# Patient Record
Sex: Male | Born: 1950
Health system: Southern US, Community
[De-identification: ages and names within clinical notes are randomized; demographics above are authoritative.]

## PROBLEM LIST (undated history)

## (undated) DIAGNOSIS — J439 Emphysema, unspecified: Secondary | ICD-10-CM

## (undated) DIAGNOSIS — I10 Essential (primary) hypertension: Secondary | ICD-10-CM

## (undated) DIAGNOSIS — H409 Unspecified glaucoma: Secondary | ICD-10-CM

## (undated) DIAGNOSIS — J449 Chronic obstructive pulmonary disease, unspecified: Secondary | ICD-10-CM

## (undated) DIAGNOSIS — C3490 Malignant neoplasm of unspecified part of unspecified bronchus or lung: Secondary | ICD-10-CM

## (undated) HISTORY — PX: OTHER SURGICAL HISTORY: SHX169

## (undated) HISTORY — PX: LUNG BIOPSY: SHX232

---

## 2001-04-27 ENCOUNTER — Emergency Department (HOSPITAL_COMMUNITY): Admission: EM | Admit: 2001-04-27 | Discharge: 2001-04-27 | Payer: Self-pay | Admitting: Emergency Medicine

## 2004-08-16 ENCOUNTER — Emergency Department: Payer: Self-pay | Admitting: Unknown Physician Specialty

## 2010-06-25 ENCOUNTER — Emergency Department: Payer: Self-pay | Admitting: Emergency Medicine

## 2013-10-11 ENCOUNTER — Emergency Department: Payer: Self-pay | Admitting: Internal Medicine

## 2013-10-11 LAB — COMPREHENSIVE METABOLIC PANEL
ALK PHOS: 85 U/L
Albumin: 3.1 g/dL — ABNORMAL LOW (ref 3.4–5.0)
Anion Gap: 6 — ABNORMAL LOW (ref 7–16)
BUN: 11 mg/dL (ref 7–18)
Bilirubin,Total: 0.9 mg/dL (ref 0.2–1.0)
Calcium, Total: 8.8 mg/dL (ref 8.5–10.1)
Chloride: 107 mmol/L (ref 98–107)
Co2: 26 mmol/L (ref 21–32)
Creatinine: 0.76 mg/dL (ref 0.60–1.30)
EGFR (African American): >60
EGFR (Non-African Amer.): >60
Glucose: 66 mg/dL (ref 65–99)
Osmolality: 275 (ref 275–301)
Potassium: 4.3 mmol/L (ref 3.5–5.1)
SGOT(AST): 17 U/L (ref 15–37)
SGPT (ALT): 12 U/L (ref 12–78)
Sodium: 139 mmol/L (ref 136–145)
TOTAL PROTEIN: 7.2 g/dL (ref 6.4–8.2)

## 2013-10-11 LAB — CK TOTAL AND CKMB (NOT AT ARMC)
CK, Total: 64 U/L
CK-MB: 0.7 ng/mL (ref 0.5–3.6)

## 2013-10-11 LAB — PROTIME-INR
INR: 1
Prothrombin Time: 13.2 secs (ref 11.5–14.7)

## 2013-10-11 LAB — CBC
HCT: 40.5 % (ref 40.0–52.0)
HGB: 12.9 g/dL — AB (ref 13.0–18.0)
MCH: 27.2 pg (ref 26.0–34.0)
MCHC: 31.9 g/dL — ABNORMAL LOW (ref 32.0–36.0)
MCV: 85 fL (ref 80–100)
Platelet: 185 10*3/uL (ref 150–440)
RBC: 4.75 10*6/uL (ref 4.40–5.90)
RDW: 14.7 % — ABNORMAL HIGH (ref 11.5–14.5)
WBC: 7.1 10*3/uL (ref 3.8–10.6)

## 2013-10-11 LAB — TROPONIN I
Troponin-I: <0.02 ng/mL
Troponin-I: <0.02 ng/mL

## 2013-10-11 LAB — APTT: Activated PTT: 29.1 secs (ref 23.6–35.9)

## 2014-01-11 ENCOUNTER — Emergency Department: Payer: Self-pay | Admitting: Emergency Medicine

## 2014-06-22 ENCOUNTER — Emergency Department: Payer: Self-pay | Admitting: Emergency Medicine

## 2014-06-22 LAB — CBC
HCT: 39.1 % — AB (ref 40.0–52.0)
HGB: 12.5 g/dL — AB (ref 13.0–18.0)
MCH: 28 pg (ref 26.0–34.0)
MCHC: 32 g/dL (ref 32.0–36.0)
MCV: 88 fL (ref 80–100)
Platelet: 144 10*3/uL — ABNORMAL LOW (ref 150–440)
RBC: 4.47 10*6/uL (ref 4.40–5.90)
RDW: 14.4 % (ref 11.5–14.5)
WBC: 3.6 10*3/uL — ABNORMAL LOW (ref 3.8–10.6)

## 2014-06-22 LAB — TROPONIN I: Troponin-I: <0.02 ng/mL

## 2014-06-22 LAB — COMPREHENSIVE METABOLIC PANEL
ANION GAP: 4 — AB (ref 7–16)
Albumin: 3.2 g/dL — ABNORMAL LOW (ref 3.4–5.0)
Alkaline Phosphatase: 86 U/L
BILIRUBIN TOTAL: 1 mg/dL (ref 0.2–1.0)
BUN: 12 mg/dL (ref 7–18)
Calcium, Total: 8.4 mg/dL — ABNORMAL LOW (ref 8.5–10.1)
Chloride: 108 mmol/L — ABNORMAL HIGH (ref 98–107)
Co2: 27 mmol/L (ref 21–32)
Creatinine: 1.06 mg/dL (ref 0.60–1.30)
EGFR (Non-African Amer.): >60
Glucose: 126 mg/dL — ABNORMAL HIGH (ref 65–99)
OSMOLALITY: 279 (ref 275–301)
POTASSIUM: 3.7 mmol/L (ref 3.5–5.1)
SGOT(AST): 28 U/L (ref 15–37)
SGPT (ALT): 19 U/L
Sodium: 139 mmol/L (ref 136–145)
Total Protein: 6.5 g/dL (ref 6.4–8.2)

## 2014-06-22 LAB — PRO B NATRIURETIC PEPTIDE: B-TYPE NATIURETIC PEPTID: 69 pg/mL (ref 0–125)

## 2014-06-22 LAB — CK TOTAL AND CKMB (NOT AT ARMC)
CK, Total: 71 U/L (ref 39–308)
CK-MB: 0.7 ng/mL (ref 0.5–3.6)

## 2014-09-15 DIAGNOSIS — R609 Edema, unspecified: Secondary | ICD-10-CM | POA: Insufficient documentation

## 2015-04-28 ENCOUNTER — Emergency Department: Payer: Medicare Other

## 2015-04-28 ENCOUNTER — Emergency Department
Admission: EM | Admit: 2015-04-28 | Discharge: 2015-04-29 | Disposition: A | Discharge status: Home / Self Care | Payer: Medicare Other | Attending: Emergency Medicine | Admitting: Emergency Medicine

## 2015-04-28 ENCOUNTER — Other Ambulatory Visit: Payer: Self-pay

## 2015-04-28 ENCOUNTER — Encounter: Payer: Self-pay | Admitting: Emergency Medicine

## 2015-04-28 DIAGNOSIS — R059 Cough, unspecified: Secondary | ICD-10-CM

## 2015-04-28 DIAGNOSIS — J441 Chronic obstructive pulmonary disease with (acute) exacerbation: Secondary | ICD-10-CM | POA: Diagnosis not present

## 2015-04-28 DIAGNOSIS — Z720 Tobacco use: Secondary | ICD-10-CM | POA: Diagnosis not present

## 2015-04-28 DIAGNOSIS — J40 Bronchitis, not specified as acute or chronic: Secondary | ICD-10-CM

## 2015-04-28 DIAGNOSIS — R0602 Shortness of breath: Secondary | ICD-10-CM | POA: Diagnosis present

## 2015-04-28 DIAGNOSIS — R05 Cough: Secondary | ICD-10-CM

## 2015-04-28 HISTORY — DX: Chronic obstructive pulmonary disease, unspecified: J44.9

## 2015-04-28 HISTORY — DX: Unspecified glaucoma: H40.9

## 2015-04-28 LAB — CBC
HCT: 37.2 % — ABNORMAL LOW (ref 40.0–52.0)
Hemoglobin: 12.1 g/dL — ABNORMAL LOW (ref 13.0–18.0)
MCH: 28.1 pg (ref 26.0–34.0)
MCHC: 32.6 g/dL (ref 32.0–36.0)
MCV: 86.4 fL (ref 80.0–100.0)
PLATELETS: 141 10*3/uL — AB (ref 150–440)
RBC: 4.3 MIL/uL — ABNORMAL LOW (ref 4.40–5.90)
RDW: 16.7 % — AB (ref 11.5–14.5)
WBC: 6.4 10*3/uL (ref 3.8–10.6)

## 2015-04-28 LAB — BASIC METABOLIC PANEL
Anion gap: 10 (ref 5–15)
BUN: 10 mg/dL (ref 6–20)
CHLORIDE: 105 mmol/L (ref 101–111)
CO2: 24 mmol/L (ref 22–32)
CREATININE: 0.71 mg/dL (ref 0.61–1.24)
Calcium: 8.6 mg/dL — ABNORMAL LOW (ref 8.9–10.3)
GFR calc Af Amer: >60 mL/min (ref >60)
GFR calc non Af Amer: >60 mL/min (ref >60)
Glucose, Bld: 96 mg/dL (ref 65–99)
Potassium: 3.5 mmol/L (ref 3.5–5.1)
Sodium: 139 mmol/L (ref 135–145)

## 2015-04-28 LAB — TROPONIN I: Troponin I: <0.03 ng/mL (ref <0.031)

## 2015-04-28 MED ORDER — ONDANSETRON HCL 4 MG/2ML IJ SOLN
4.0000 mg | Freq: Once | INTRAMUSCULAR | Status: AC
  Administered 2015-04-28: 4 mg via INTRAVENOUS
  Filled 2015-04-28: qty 2

## 2015-04-28 MED ORDER — IPRATROPIUM-ALBUTEROL 0.5-2.5 (3) MG/3ML IN SOLN
3.0000 mL | Freq: Once | RESPIRATORY_TRACT | Status: AC
  Administered 2015-04-28: 3 mL via RESPIRATORY_TRACT
  Filled 2015-04-28: qty 3

## 2015-04-28 MED ORDER — SODIUM CHLORIDE 0.9 % IV BOLUS (SEPSIS)
1000.0000 mL | Freq: Once | INTRAVENOUS | Status: AC
  Administered 2015-04-29: 1000 mL via INTRAVENOUS

## 2015-04-28 NOTE — ED Notes (Signed)
Pt states feeling nauseous, verbal order given for 4 mg Zofran and DUO NEB, see MAR

## 2015-04-28 NOTE — ED Notes (Signed)
Patient presents to Emergency Department via [facility] with complaints of grey productive cough and SOB, hx of COPD.  Pt cough present at triage with diminished sounds bilaterally in lungs.  EMS gave '5mg'$  albuterol SVN sats now 100% on mask.

## 2015-04-28 NOTE — ED Notes (Addendum)
Pt reports used inhaler without relief, reports coughing began 2 pm and became worse in the last 2 hours. Pt denies chest pain or abdominal pain. (+) nausea, reports coughs up "milky gray phlegm." Pt is an every day smoker, 1 pack/day. Pt alert and oriented x 4, skin warm and dry. Hx of COPD.

## 2015-04-29 MED ORDER — ALBUTEROL SULFATE HFA 108 (90 BASE) MCG/ACT IN AERS: 2.0000 | INHALATION_SPRAY | Freq: Four times a day (QID) | RESPIRATORY_TRACT | Status: DC | PRN

## 2015-04-29 MED ORDER — BENZONATATE 100 MG PO CAPS
100.0000 mg | ORAL_CAPSULE | Freq: Once | ORAL | Status: AC
  Administered 2015-04-29: 100 mg via ORAL
  Filled 2015-04-29: qty 1

## 2015-04-29 MED ORDER — AZITHROMYCIN 250 MG PO TABS
500.0000 mg | ORAL_TABLET | Freq: Once | ORAL | Status: AC
  Administered 2015-04-29: 500 mg via ORAL
  Filled 2015-04-29: qty 2

## 2015-04-29 MED ORDER — PREDNISONE 20 MG PO TABS: 60.0000 mg | ORAL_TABLET | Freq: Every day | ORAL | Status: DC

## 2015-04-29 MED ORDER — AZITHROMYCIN 250 MG PO TABS
ORAL_TABLET | ORAL | Status: AC
Start: 2015-04-29 — End: 2015-05-04

## 2015-04-29 MED ORDER — BENZONATATE 100 MG PO CAPS: 100.0000 mg | ORAL_CAPSULE | Freq: Three times a day (TID) | ORAL | Status: DC | PRN

## 2015-04-29 MED ORDER — PREDNISONE 20 MG PO TABS
60.0000 mg | ORAL_TABLET | Freq: Once | ORAL | Status: AC
  Administered 2015-04-29: 60 mg via ORAL
  Filled 2015-04-29: qty 3

## 2015-04-29 NOTE — ED Notes (Signed)

## 2015-04-29 NOTE — ED Notes (Signed)
Assisted pt up in bed for comfort. Pt reports breathing is better but is c/o sore throat due to coughing. No increased work in breathing noted, skin warm and dry, family at bedside.

## 2015-04-29 NOTE — ED Provider Notes (Signed)
[facility] Emergency Department Provider Note  ____________________________________________  Time seen: Approximately 2344 PM  I have reviewed the triage vital signs and the nursing notes.   HISTORY  Chief Complaint Shortness of Breath and Cough    HPI Carl Bell is a 64 y.o. male who comes in to the hospital tonight with cough and difficulty breathing. The patient reports that he couldn't stop coughing. He reports that this started today when he smoked a cigarette and every time he tries to Smokies coughing constantly. The patient does have a history of COPD and reports that he was feeling okay until today. The patient has inhalers at home and rarely uses them but reports that he did try using his inhaler today and it did not work. The patient denies any fever or sick contacts. He denies any chest pain or headache no dizziness or lightheadedness no abdominal pain nausea or vomiting. The patient simply says he is coughing. The patient has a cough productive of milky grey sputum. The patient came in by EMS for evaluation.   Past Medical History  Diagnosis Date  . COPD (chronic obstructive pulmonary disease) ([place])   . Glaucoma     There are no active problems to display for this patient.   History reviewed. No pertinent past surgical history.  Current Outpatient Rx  Name  Route  Sig  Dispense  Refill  . albuterol (PROVENTIL HFA;VENTOLIN HFA) 108 (90 BASE) MCG/ACT inhaler   Inhalation   Inhale 2 puffs into the lungs every 6 (six) hours as needed for wheezing or shortness of breath.   1 Inhaler   0   . azithromycin (ZITHROMAX Z-PAK) 250 MG tablet      Take 2 tablets (500 mg) on  Day 1,  followed by 1 tablet (250 mg) once daily on Days 2 through 5.   6 each   0   . benzonatate (TESSALON PERLES) 100 MG capsule   Oral   Take 1 capsule (100 mg total) by mouth 3 (three) times daily as needed for cough.   15 capsule   0   . predniSONE (DELTASONE)  20 MG tablet   Oral   Take 3 tablets (60 mg total) by mouth daily.   12 tablet   0     Allergies Review of patient's allergies indicates no known allergies.  History reviewed. No pertinent family history.  Social History Social History  Substance Use Topics  . Smoking status: Current Every Day Smoker -- 1.00 packs/day    Types: Cigarettes  . Smokeless tobacco: None  . Alcohol Use: Yes    Review of Systems Constitutional: No fever/chills Eyes: No visual changes. ENT: No sore throat. Cardiovascular: Denies chest pain. Respiratory: shortness of breath and cough Gastrointestinal: No abdominal pain.  No nausea, no vomiting.  No diarrhea.  No constipation. Genitourinary: Negative for dysuria. Musculoskeletal: Negative for back pain. Skin: Negative for rash. Neurological: Negative for headaches, focal weakness or numbness.  10-point ROS otherwise negative.  ____________________________________________   PHYSICAL EXAM:  VITAL SIGNS: ED Triage Vitals  Enc Vitals Group     BP 04/28/15 2244 159/97 mmHg     Pulse Rate 04/28/15 2244 103     Resp 04/28/15 2244 23     Temp 04/28/15 2244 98.2 F (36.8 C)     Temp Source 04/28/15 2244 Axillary     SpO2 04/28/15 2244 100 %     Weight 04/28/15 2244 146 lb 9.7 oz (66.5 kg)  Height 04/28/15 2244 '6\' 1"'$  (1.854 m)     Head Cir --      Peak Flow --      Pain Score --      Pain Loc --      Pain Edu? --      Excl. in [place]? --     Constitutional: Alert and oriented. Well appearing and in moderate distress. Eyes: Conjunctivae are normal. PERRL. EOMI. Head: Atraumatic. Nose: No congestion/rhinnorhea. Mouth/Throat: Mucous membranes are moist.  Oropharynx non-erythematous. Cardiovascular: Normal rate, regular rhythm. Grossly normal heart sounds.  Good peripheral circulation. Respiratory: Mildly increased respiratory effort with significant coughing.  No retractions. Lungs diminished throughout. Gastrointestinal: Soft and  nontender. No distention. Positive bowel sounds Musculoskeletal: No lower extremity tenderness nor edema.   Neurologic:  Normal speech and language.  Skin:  Skin is warm, dry and intact.  Psychiatric: Mood and affect are normal.   ____________________________________________   LABS (all labs ordered are listed, but only abnormal results are displayed)  Labs Reviewed  BASIC METABOLIC PANEL - Abnormal; Notable for the following:    Calcium 8.6 (*)    All other components within normal limits  CBC - Abnormal; Notable for the following:    RBC 4.30 (*)    Hemoglobin 12.1 (*)    HCT 37.2 (*)    RDW 16.7 (*)    Platelets 141 (*)    All other components within normal limits  TROPONIN I   ____________________________________________  EKG  ED ECG REPORT I, Loney Hering, the attending physician, personally viewed and interpreted this ECG.   Date: 04/28/2015  EKG Time: 2244  Rate: 103  Rhythm: sinus tachycardia  Axis: normal  Intervals:none  ST&T Change: none  ____________________________________________  RADIOLOGY  Chest x-ray: No active cardiopulmonary disease ____________________________________________   PROCEDURES  Procedure(s) performed: None  Critical Care performed: No  ____________________________________________   INITIAL IMPRESSION / ASSESSMENT AND PLAN / ED COURSE  Pertinent labs & imaging results that were available during my care of the patient were reviewed by me and considered in my medical decision making (see chart for details).  This is a 64 year old male who has a history of COPD and comes in today with coughing and some shortness of breath. The patient had received a breathing treatment by EMS. I did give him another DuoNeb here. While the patient's chest x-ray does not show any opacity he does have some significant cough with a concern for bronchitis. I gave the patient dose of Solu-Medrol as well as a dose of azithromycin. He was  tachycardic and did receive a liter of normal saline. The patient's tachycardia improved and his cough also improved. The patient also received a dose of benzonatate. The patient be discharged home to follow-up with his primary care physician. I discussed this with the family they had no further complaints and concerns. ____________________________________________   FINAL CLINICAL IMPRESSION(S) / ED DIAGNOSES  Final diagnoses:  Bronchitis  Cough      Loney Hering, MD 04/29/15 [phone]

## 2015-04-29 NOTE — Discharge Instructions (Signed)
Cough, Adult Coughing is a reflex that clears your throat and your airways. Coughing helps to heal and protect your lungs. It is normal to cough occasionally, but a cough that happens with other symptoms or lasts a long time may be a sign of a condition that needs treatment. A cough may last only 2-3 weeks (acute), or it may last longer than 8 weeks (chronic). CAUSES Coughing is commonly caused by:  Breathing in substances that irritate your lungs.  A viral or bacterial respiratory infection.  Allergies.  Asthma.  Postnasal drip.  Smoking.  Acid backing up from the stomach into the esophagus (gastroesophageal reflux).  Certain medicines.  Chronic lung problems, including COPD (or rarely, lung cancer).  Other medical conditions such as heart failure. HOME CARE INSTRUCTIONS  Pay attention to any changes in your symptoms. Take these actions to help with your discomfort:  Take medicines only as told by your health care provider.  If you were prescribed an antibiotic medicine, take it as told by your health care provider. Do not stop taking the antibiotic even if you start to feel better.  Talk with your health care provider before you take a cough suppressant medicine.  Drink enough fluid to keep your urine clear or pale yellow.  If the air is dry, use a cold steam vaporizer or humidifier in your bedroom or your home to help loosen secretions.  Avoid anything that causes you to cough at work or at home.  If your cough is worse at night, try sleeping in a semi-upright position.  Avoid cigarette smoke. If you smoke, quit smoking. If you need help quitting, ask your health care provider.  Avoid caffeine.  Avoid alcohol.  Rest as needed. SEEK MEDICAL CARE IF:   You have new symptoms.  You cough up pus.  Your cough does not get better after 2-3 weeks, or your cough gets worse.  You cannot control your cough with suppressant medicines and you are losing sleep.  You  develop pain that is getting worse or pain that is not controlled with pain medicines.  You have a fever.  You have unexplained weight loss.  You have night sweats. SEEK IMMEDIATE MEDICAL CARE IF:  You cough up blood.  You have difficulty breathing.  Your heartbeat is very fast.   This information is not intended to replace advice given to you by your health care provider. Make sure you discuss any questions you have with your health care provider.   Document Released: 12/27/2010 Document Revised: 03/21/2015 Document Reviewed: 09/06/2014 Elsevier Interactive Patient Education 2016 Elsevier Inc.  Upper Respiratory Infection, Adult Most upper respiratory infections (URIs) are a viral infection of the air passages leading to the lungs. A URI affects the nose, throat, and upper air passages. The most common type of URI is nasopharyngitis and is typically referred to as "the common cold." URIs run their course and usually go away on their own. Most of the time, a URI does not require medical attention, but sometimes a bacterial infection in the upper airways can follow a viral infection. This is called a secondary infection. Sinus and middle ear infections are common types of secondary upper respiratory infections. Bacterial pneumonia can also complicate a URI. A URI can worsen asthma and chronic obstructive pulmonary disease (COPD). Sometimes, these complications can require emergency medical care and may be life threatening.  CAUSES Almost all URIs are caused by viruses. A virus is a type of germ and can spread from one  person to another.  RISKS FACTORS You may be at risk for a URI if:   You smoke.   You have chronic heart or lung disease.  You have a weakened defense (immune) system.   You are very young or very old.   You have nasal allergies or asthma.  You work in crowded or poorly ventilated areas.  You work in health care facilities or schools. SIGNS AND SYMPTOMS    Symptoms typically develop 2-3 days after you come in contact with a cold virus. Most viral URIs last 7-10 days. However, viral URIs from the influenza virus (flu virus) can last 14-18 days and are typically more severe. Symptoms may include:   Runny or stuffy (congested) nose.   Sneezing.   Cough.   Sore throat.   Headache.   Fatigue.   Fever.   Loss of appetite.   Pain in your forehead, behind your eyes, and over your cheekbones (sinus pain).  Muscle aches.  DIAGNOSIS  Your health care provider may diagnose a URI by:  Physical exam.  Tests to check that your symptoms are not due to another condition such as:  Strep throat.  Sinusitis.  Pneumonia.  Asthma. TREATMENT  A URI goes away on its own with time. It cannot be cured with medicines, but medicines may be prescribed or recommended to relieve symptoms. Medicines may help:  Reduce your fever.  Reduce your cough.  Relieve nasal congestion. HOME CARE INSTRUCTIONS   Take medicines only as directed by your health care provider.   Gargle warm saltwater or take cough drops to comfort your throat as directed by your health care provider.  Use a warm mist humidifier or inhale steam from a shower to increase air moisture. This may make it easier to breathe.  Drink enough fluid to keep your urine clear or pale yellow.   Eat soups and other clear broths and maintain good nutrition.   Rest as needed.   Return to work when your temperature has returned to normal or as your health care provider advises. You may need to stay home longer to avoid infecting others. You can also use a face mask and careful hand washing to prevent spread of the virus.  Increase the usage of your inhaler if you have asthma.   Do not use any tobacco products, including cigarettes, chewing tobacco, or electronic cigarettes. If you need help quitting, ask your health care provider. PREVENTION  The best way to protect  yourself from getting a cold is to practice good hygiene.   Avoid oral or hand contact with people with cold symptoms.   Wash your hands often if contact occurs.  There is no clear evidence that vitamin C, vitamin E, echinacea, or exercise reduces the chance of developing a cold. However, it is always recommended to get plenty of rest, exercise, and practice good nutrition.  SEEK MEDICAL CARE IF:   You are getting worse rather than better.   Your symptoms are not controlled by medicine.   You have chills.  You have worsening shortness of breath.  You have brown or red mucus.  You have yellow or brown nasal discharge.  You have pain in your face, especially when you bend forward.  You have a fever.  You have swollen neck glands.  You have pain while swallowing.  You have white areas in the back of your throat. SEEK IMMEDIATE MEDICAL CARE IF:   You have severe or persistent:  Headache.  Ear pain.  Sinus pain.  Chest pain.  You have chronic lung disease and any of the following:  Wheezing.  Prolonged cough.  Coughing up blood.  A change in your usual mucus.  You have a stiff neck.  You have changes in your:  Vision.  Hearing.  Thinking.  Mood. MAKE SURE YOU:   Understand these instructions.  Will watch your condition.  Will get help right away if you are not doing well or get worse.   This information is not intended to replace advice given to you by your health care provider. Make sure you discuss any questions you have with your health care provider.   Document Released: 12/24/2000 Document Revised: 11/14/2014 Document Reviewed: 10/05/2013 Elsevier Interactive Patient Education [employer].

## 2015-08-27 ENCOUNTER — Emergency Department: Payer: Medicare Other

## 2015-08-27 ENCOUNTER — Emergency Department
Admission: EM | Admit: 2015-08-27 | Discharge: 2015-08-27 | Disposition: A | Discharge status: Home / Self Care | Payer: Medicare Other | Attending: Emergency Medicine | Admitting: Emergency Medicine

## 2015-08-27 DIAGNOSIS — I10 Essential (primary) hypertension: Secondary | ICD-10-CM

## 2015-08-27 DIAGNOSIS — J441 Chronic obstructive pulmonary disease with (acute) exacerbation: Secondary | ICD-10-CM | POA: Insufficient documentation

## 2015-08-27 DIAGNOSIS — F17210 Nicotine dependence, cigarettes, uncomplicated: Secondary | ICD-10-CM | POA: Diagnosis not present

## 2015-08-27 DIAGNOSIS — R918 Other nonspecific abnormal finding of lung field: Secondary | ICD-10-CM

## 2015-08-27 DIAGNOSIS — R05 Cough: Secondary | ICD-10-CM | POA: Diagnosis present

## 2015-08-27 DIAGNOSIS — J40 Bronchitis, not specified as acute or chronic: Secondary | ICD-10-CM

## 2015-08-27 HISTORY — DX: Essential (primary) hypertension: I10

## 2015-08-27 LAB — COMPREHENSIVE METABOLIC PANEL
ALBUMIN: 3.6 g/dL (ref 3.5–5.0)
ALK PHOS: 65 U/L (ref 38–126)
ALT: 13 U/L — AB (ref 17–63)
ANION GAP: 7 (ref 5–15)
AST: 22 U/L (ref 15–41)
BILIRUBIN TOTAL: 1 mg/dL (ref 0.3–1.2)
BUN: 13 mg/dL (ref 6–20)
CALCIUM: 9.1 mg/dL (ref 8.9–10.3)
CO2: 27 mmol/L (ref 22–32)
CREATININE: 0.86 mg/dL (ref 0.61–1.24)
Chloride: 103 mmol/L (ref 101–111)
GFR calc Af Amer: >60 mL/min (ref >60)
GFR calc non Af Amer: >60 mL/min (ref >60)
GLUCOSE: 99 mg/dL (ref 65–99)
Potassium: 4.2 mmol/L (ref 3.5–5.1)
Sodium: 137 mmol/L (ref 135–145)
TOTAL PROTEIN: 6.9 g/dL (ref 6.5–8.1)

## 2015-08-27 LAB — CBC
HEMATOCRIT: 37.9 % — AB (ref 40.0–52.0)
HEMOGLOBIN: 12.4 g/dL — AB (ref 13.0–18.0)
MCH: 27.5 pg (ref 26.0–34.0)
MCHC: 32.8 g/dL (ref 32.0–36.0)
MCV: 83.7 fL (ref 80.0–100.0)
Platelets: 137 10*3/uL — ABNORMAL LOW (ref 150–440)
RBC: 4.53 MIL/uL (ref 4.40–5.90)
RDW: 15.3 % — ABNORMAL HIGH (ref 11.5–14.5)
WBC: 4.5 10*3/uL (ref 3.8–10.6)

## 2015-08-27 MED ORDER — AZITHROMYCIN 250 MG PO TABS
500.0000 mg | ORAL_TABLET | Freq: Once | ORAL | Status: AC
  Administered 2015-08-27: 500 mg via ORAL

## 2015-08-27 MED ORDER — IPRATROPIUM-ALBUTEROL 0.5-2.5 (3) MG/3ML IN SOLN
9.0000 mL | Freq: Once | RESPIRATORY_TRACT | Status: AC
  Administered 2015-08-27: 9 mL via RESPIRATORY_TRACT
  Filled 2015-08-27: qty 9

## 2015-08-27 MED ORDER — PREDNISONE 20 MG PO TABS
60.0000 mg | ORAL_TABLET | Freq: Once | ORAL | Status: AC
  Administered 2015-08-27: 60 mg via ORAL

## 2015-08-27 MED ORDER — AZITHROMYCIN 500 MG PO TABS
ORAL_TABLET | ORAL | Status: AC
  Administered 2015-08-27: 500 mg via ORAL
  Filled 2015-08-27: qty 1

## 2015-08-27 MED ORDER — PREDNISONE 20 MG PO TABS: 60.0000 mg | ORAL_TABLET | Freq: Every day | ORAL | Status: DC

## 2015-08-27 MED ORDER — IPRATROPIUM-ALBUTEROL 0.5-2.5 (3) MG/3ML IN SOLN
RESPIRATORY_TRACT | Status: AC
  Administered 2015-08-27: 9 mL via RESPIRATORY_TRACT
  Filled 2015-08-27: qty 9

## 2015-08-27 MED ORDER — AMLODIPINE BESYLATE 5 MG PO TABS: 5.0000 mg | ORAL_TABLET | Freq: Every day | ORAL | Status: DC

## 2015-08-27 MED ORDER — PREDNISONE 20 MG PO TABS
ORAL_TABLET | ORAL | Status: AC
  Administered 2015-08-27: 60 mg via ORAL
  Filled 2015-08-27: qty 3

## 2015-08-27 MED ORDER — ALBUTEROL SULFATE (2.5 MG/3ML) 0.083% IN NEBU: 5.0000 mg | INHALATION_SOLUTION | Freq: Once | RESPIRATORY_TRACT | Status: DC

## 2015-08-27 MED ORDER — IOHEXOL 300 MG/ML  SOLN
75.0000 mL | Freq: Once | INTRAMUSCULAR | Status: AC | PRN
  Administered 2015-08-27: 75 mL via INTRAVENOUS
  Filled 2015-08-27: qty 75

## 2015-08-27 MED ORDER — AZITHROMYCIN 250 MG PO TABS: ORAL_TABLET | ORAL | Status: AC

## 2015-08-27 NOTE — ED Notes (Signed)
Pt arrives to ER c/o cough, congestion, sneezing X 4 days. Pt reports white productive sputum. Pt hx of COPD and emphysema. Pt alert and oriented X4, active, cooperative, pt in NAD. RR even and unlabored, color WNL.

## 2015-08-27 NOTE — ED Provider Notes (Addendum)
[facility] Emergency Department Provider Note  ____________________________________________  Time seen: Approximately 891 PM  I have reviewed the triage vital signs and the nursing notes.   HISTORY  Chief Complaint Cough and URI    HPI Carl Bell is a 65 y.o. male with a history of COPD who is a smoker who is presenting with 4 days of cough and shortness of breath. He says it is also associated with a runny nose. He denies any fevers or known sick contacts. Has a history of high blood pressure but he says as he is out of his medications. Denies any pain. Says that he still smokes about a half-pack of cigarettes a day. Productive of clear sputum.  Past Medical History  Diagnosis Date  . COPD (chronic obstructive pulmonary disease) ([place])   . Glaucoma   . Hypertension     There are no active problems to display for this patient.   History reviewed. No pertinent past surgical history.  Current Outpatient Rx  Name  Route  Sig  Dispense  Refill  . albuterol (PROVENTIL HFA;VENTOLIN HFA) 108 (90 BASE) MCG/ACT inhaler   Inhalation   Inhale 2 puffs into the lungs every 6 (six) hours as needed for wheezing or shortness of breath.   1 Inhaler   0   . benzonatate (TESSALON PERLES) 100 MG capsule   Oral   Take 1 capsule (100 mg total) by mouth 3 (three) times daily as needed for cough.   15 capsule   0   . predniSONE (DELTASONE) 20 MG tablet   Oral   Take 3 tablets (60 mg total) by mouth daily.   12 tablet   0     Allergies Review of patient's allergies indicates no known allergies.  No family history on file.  Social History Social History  Substance Use Topics  . Smoking status: Current Every Day Smoker -- 1.00 packs/day    Types: Cigarettes  . Smokeless tobacco: None  . Alcohol Use: Yes    Review of Systems Constitutional: No fever/chills Eyes: No visual changes. ENT: No sore throat. Cardiovascular: Denies chest  pain. Respiratory: As above Gastrointestinal: No abdominal pain.  No nausea, no vomiting.  No diarrhea.  No constipation. Genitourinary: Negative for dysuria. Musculoskeletal: Negative for back pain. Skin: Negative for rash. Neurological: Negative for headaches, focal weakness or numbness.  10-point ROS otherwise negative.  ____________________________________________   PHYSICAL EXAM:  VITAL SIGNS: ED Triage Vitals  Enc Vitals Group     BP 08/27/15 1610 196/108 mmHg     Pulse Rate 08/27/15 1610 99     Resp 08/27/15 1610 16     Temp 08/27/15 1610 98.6 F (37 C)     Temp Source 08/27/15 1610 Oral     SpO2 08/27/15 1610 98 %     Weight 08/27/15 1610 175 lb (79.379 kg)     Height 08/27/15 1610 '6\' 1"'$  (1.854 m)     Head Cir --      Peak Flow --      Pain Score --      Pain Loc --      Pain Edu? --      Excl. in [place]? --     Constitutional: Alert and oriented. Well appearing and in no acute distress. Eyes: Conjunctivae are normal. PERRL. EOMI. Head: Atraumatic. Nose: No congestion/rhinnorhea. Mouth/Throat: Mucous membranes are moist.   Neck: No stridor.   Cardiovascular: Normal rate, regular rhythm. Grossly normal heart sounds.  Good peripheral circulation. Respiratory: Normal respiratory effort.  No retractions. Lungs CTAB. Mildly prolonged expiratory phase. Gastrointestinal: Soft and nontender. No distention. No abdominal bruits. No CVA tenderness. Musculoskeletal: No lower extremity tenderness nor edema.  No joint effusions. Neurologic:  Normal speech and language. No gross focal neurologic deficits are appreciated. No gait instability. Skin:  Skin is warm, dry and intact. No rash noted. Psychiatric: Mood and affect are normal. Speech and behavior are normal.  ____________________________________________   LABS (all labs ordered are listed, but only abnormal results are displayed)  Labs Reviewed  CBC - Abnormal; Notable for the following:    Hemoglobin 12.4 (*)     HCT 37.9 (*)    RDW 15.3 (*)    Platelets 137 (*)    All other components within normal limits  COMPREHENSIVE METABOLIC PANEL - Abnormal; Notable for the following:    ALT 13 (*)    All other components within normal limits   ____________________________________________  EKG  ____________________________________________  RADIOLOGY   IMPRESSION: CT of the thorax preferably with contrast recommended to evaluate abnormal opacity in the lingula bulges concerning for neoplasm. These results will be called to the ordering clinician or representative by the Radiologist Assistant, and communication documented in the PACS or zVision Dashboard.    IMPRESSION: 1. 4.2 cm macro lobulated lingular mass with surrounding spiculation consistent with a primary bronchogenic carcinoma. No definitive mediastinal or distant metastatic disease by CT scan. Recommend further evaluation with PET-CT. 2. Incompletely imaged low-attenuation lesions in the bilateral kidneys which are favored to represent at least mildly complex renal cysts. Recommend further evaluation of the kidneys with non emergent renal protocol CT scan of the abdomen (with and without contrast). 3. Atherosclerosis including coronary artery disease. 4. Nonspecific 4 mm nodule in the posterior aspect of the left upper lobe warrants attention on routine follow-up imaging. 5. Pulmonary emphysema. ____________________________________________   PROCEDURES  ____________________________________________   INITIAL IMPRESSION / ASSESSMENT AND PLAN / ED COURSE  Pertinent labs & imaging results that were available during my care of the patient were reviewed by me and considered in my medical decision making (see chart for details).  We'll treat with steroids, antibiotics and will pursue the CAT scan.  ----------------------------------------- 7:35 PM on 08/27/2015 -----------------------------------------  Patient says symptoms  are relieved at this time. We'll discharge with azithromycin as well as prednisone. Patient says he has an inhaler at home that he will use for symptomatically relief. I also counseled the patient regarding his CAT scan result. He is aware that the mass in his chest is likely cancer. I discussed the case with Dr. Grayland Ormond who says that his office will be contacting the patient tomorrow morning to schedule an urgent follow-up appointment. The patient knows to expect a call. Will be discharged home. Patient denies any recent weight loss or night sweats. ____________________________________________   FINAL CLINICAL IMPRESSION(S) / ED DIAGNOSES  Lung mass. Bronchitis.    Orbie Pyo, MD 08/27/15 [phone]  Patient says is also out of his high blood pressure medication. Unknown meds. We will give amlodipine 5 mg.  Orbie Pyo, MD 08/27/15 [phone]

## 2015-08-28 ENCOUNTER — Telehealth: Payer: Self-pay | Admitting: *Deleted

## 2015-08-28 NOTE — Telephone Encounter (Signed)
Attempted to contact patient this am with appointment for clinic 08/30/15 regarding lung abnormality seen from ED visit CT scan. Patient verbalizes that he is already seeing a doctor and did not want to elaborate. Further review of chart this afternoon does not give insight as to a primary care physician or other provider. Attempted to call patient back to discuss further. Mother states that he is not home and I should call back.

## 2015-08-28 NOTE — Telephone Encounter (Signed)
Patient called back and is ready to accept appointment. Will be here Thursday for scheduled appointment.

## 2015-08-28 NOTE — Telephone Encounter (Signed)
Ok, thank you. Keep his name nearby in case he changes his mind or turns up in the ER again.

## 2015-08-30 ENCOUNTER — Inpatient Hospital Stay: Payer: Medicare Other | Attending: Oncology | Admitting: Oncology

## 2015-08-30 ENCOUNTER — Encounter: Payer: Self-pay | Admitting: Oncology

## 2015-08-30 VITALS — BP 133/83 | HR 94 | Temp 97.8°F | Resp 16 | Ht 72.84 in | Wt 150.1 lb

## 2015-08-30 DIAGNOSIS — I10 Essential (primary) hypertension: Secondary | ICD-10-CM | POA: Diagnosis not present

## 2015-08-30 DIAGNOSIS — J449 Chronic obstructive pulmonary disease, unspecified: Secondary | ICD-10-CM | POA: Insufficient documentation

## 2015-08-30 DIAGNOSIS — I2510 Atherosclerotic heart disease of native coronary artery without angina pectoris: Secondary | ICD-10-CM | POA: Diagnosis not present

## 2015-08-30 DIAGNOSIS — F17210 Nicotine dependence, cigarettes, uncomplicated: Secondary | ICD-10-CM | POA: Diagnosis not present

## 2015-08-30 DIAGNOSIS — R918 Other nonspecific abnormal finding of lung field: Secondary | ICD-10-CM

## 2015-08-30 DIAGNOSIS — Z79899 Other long term (current) drug therapy: Secondary | ICD-10-CM | POA: Insufficient documentation

## 2015-08-30 DIAGNOSIS — N281 Cyst of kidney, acquired: Secondary | ICD-10-CM | POA: Diagnosis not present

## 2015-08-30 NOTE — Progress Notes (Signed)
Patient went to ER with cough and trouble breathing.  While there they performed a CT that showed a lung mass.

## 2015-08-31 ENCOUNTER — Telehealth: Payer: Self-pay | Admitting: *Deleted

## 2015-08-31 NOTE — Telephone Encounter (Signed)
Called patient to follow up with plan from medical oncology appointment yesterday. Reviewed pet scan instructions. Verbalized understanding and will call for any needs/ questions.

## 2015-09-03 ENCOUNTER — Other Ambulatory Visit: Payer: Self-pay | Admitting: Oncology

## 2015-09-03 DIAGNOSIS — R918 Other nonspecific abnormal finding of lung field: Secondary | ICD-10-CM

## 2015-09-04 ENCOUNTER — Telehealth: Payer: Self-pay | Admitting: *Deleted

## 2015-09-04 ENCOUNTER — Ambulatory Visit: Payer: Medicare Other

## 2015-09-04 NOTE — Telephone Encounter (Signed)
Patient is scheduled for CT Guided Lung Biopsy Thursday 2/23 at 8:00 am. Patient to arrive at 7:00 am in medical mall. Patient also scheduled for PFT's on Tuesday 2/28 at 9:00 am. Patient to arrive at 8:45 am. Patient given instructions for prior to CT Guided Biopsy, patient to be NPO after MN prior to procedure. Patient verbalized understanding of instructions.

## 2015-09-05 ENCOUNTER — Other Ambulatory Visit: Payer: Self-pay | Admitting: General Surgery

## 2015-09-05 NOTE — Progress Notes (Signed)
[place]  [phone] [phone]  ID: Carl Bell OB: 1950/11/19  MR#: [id number]  TTS#:[id number]  Patient Care Team: Kirk Ruths, MD as PCP - General (Internal Medicine)  CHIEF COMPLAINT:  Chief Complaint  Patient presents with  . New Evaluation    lung mass    INTERVAL HISTORY: Patient is a 65 year old male with a long-standing smoking history who presented to the emergency room with cough and difficulty breathing. Subsequent workup included a CT scan of the chest that revealed a lingular lung mass. Patient is anxious, but otherwise feels well. He has no neurologic complaints. He denies any recent fevers. He has a good appetite and denies weight loss. He continues to have a cough, but denies chest pain or shortness of breath. He denies any hemoptysis. He denies any nausea, vomiting, constipation, or diarrhea. He has no urinary complaints. Patient otherwise feels well and offers no further specific complaints  REVIEW OF SYSTEMS:   Review of Systems  Constitutional: Negative for fever, weight loss and malaise/fatigue.  Respiratory: Positive for cough. Negative for hemoptysis and shortness of breath.   Cardiovascular: Negative.  Negative for chest pain.  Gastrointestinal: Negative.   Musculoskeletal: Negative.   Neurological: Negative.  Negative for weakness.  Psychiatric/Behavioral: The patient is nervous/anxious.     As per HPI. Otherwise, a complete review of systems is negatve.  PAST MEDICAL HISTORY: Past Medical History  Diagnosis Date  . COPD (chronic obstructive pulmonary disease) ([place])   . Glaucoma   . Hypertension     PAST SURGICAL HISTORY: No past surgical history on file.  FAMILY HISTORY: No reported history of malignancy or chronic disease.     ADVANCED DIRECTIVES:    HEALTH MAINTENANCE: Social History  Substance Use Topics  . Smoking status: Current Every Day Smoker -- 1.00 packs/day for 35 years   Types: Cigarettes  . Smokeless tobacco: Never Used  . Alcohol Use: 0.0 oz/week    0 Standard drinks or equivalent per week     Colonoscopy:  PAP:  Bone density:  Lipid panel:  No Known Allergies  Current Outpatient Prescriptions  Medication Sig Dispense Refill  . albuterol (PROVENTIL HFA;VENTOLIN HFA) 108 (90 BASE) MCG/ACT inhaler Inhale 2 puffs into the lungs every 6 (six) hours as needed for wheezing or shortness of breath. 1 Inhaler 0  . amLODipine (NORVASC) 5 MG tablet Take 1 tablet (5 mg total) by mouth daily. 30 tablet 0  . benzonatate (TESSALON PERLES) 100 MG capsule Take 1 capsule (100 mg total) by mouth 3 (three) times daily as needed for cough. 15 capsule 0  . predniSONE (DELTASONE) 20 MG tablet Take 3 tablets (60 mg total) by mouth daily. 12 tablet 0  . brimonidine (ALPHAGAN) 0.2 % ophthalmic solution   4  . timolol (TIMOPTIC) 0.5 % ophthalmic solution   4   No current facility-administered medications for this visit.    OBJECTIVE: Filed Vitals:   08/30/15 1457  BP: 133/83  Pulse: 94  Temp: 97.8 F (36.6 C)  Resp: 16     Body mass index is 19.9 kg/(m^2).    ECOG FS:0 - Asymptomatic  General: Well-developed, well-nourished, no acute distress. Eyes: Pink conjunctiva, anicteric sclera. HEENT: Normocephalic, moist mucous membranes, clear oropharnyx. Lungs: Clear to auscultation bilaterally. Heart: Regular rate and rhythm. No rubs, murmurs, or gallops. Abdomen: Soft, nontender, nondistended. No organomegaly noted, normoactive bowel sounds. Musculoskeletal: No edema, cyanosis, or clubbing. Neuro: Alert, answering all questions appropriately. Cranial nerves grossly intact.  Skin: No rashes or petechiae noted. Psych: Normal affect. Lymphatics: No cervical, calvicular, axillary or inguinal LAD.   LAB RESULTS:  Lab Results  Component Value Date   NA 137 08/27/2015   K 4.2 08/27/2015   CL 103 08/27/2015   CO2 27 08/27/2015   GLUCOSE 99 08/27/2015   BUN 13  08/27/2015   CREATININE 0.86 08/27/2015   CALCIUM 9.1 08/27/2015   PROT 6.9 08/27/2015   ALBUMIN 3.6 08/27/2015   AST 22 08/27/2015   ALT 13* 08/27/2015   ALKPHOS 65 08/27/2015   BILITOT 1.0 08/27/2015   GFRNONAA >60 08/27/2015   GFRAA >60 08/27/2015    Lab Results  Component Value Date   WBC 4.5 08/27/2015   HGB 12.4* 08/27/2015   HCT 37.9* 08/27/2015   MCV 83.7 08/27/2015   PLT 137* 08/27/2015     STUDIES: Dg Chest 2 View  08/27/2015  CLINICAL DATA:  Cough and congestion for 4 days with history of COPD, patient smokes EXAM: CHEST  2 VIEW COMPARISON:  04/29/2015 FINDINGS: The heart size and vascular pattern are normal. Hyperinflation indicate COPD. There is a new rounded opacity in the lingula which is concerning for a mass. It measures about 3 cm in diameter. No pleural effusion. IMPRESSION: CT of the thorax preferably with contrast recommended to evaluate abnormal opacity in the lingula bulges concerning for neoplasm. These results will be called to the ordering clinician or representative by the Radiologist Assistant, and communication documented in the PACS or zVision Dashboard. Electronically Signed   By: Skipper Cliche M.D.   On: 08/27/2015 16:45   Ct Chest W Contrast  08/27/2015  CLINICAL DATA:  65 year old male with cough and sneezing for the past 3 days. Possible pulmonary nodule on chest x-ray from earlier today. EXAM: CT CHEST WITH CONTRAST TECHNIQUE: Multidetector CT imaging of the chest was performed during intravenous contrast administration. CONTRAST:  63m OMNIPAQUE IOHEXOL 300 MG/ML  SOLN COMPARISON:  Prior chest x-ray 08/27/2015 FINDINGS: Mediastinum: Unremarkable CT appearance of the thyroid gland. No suspicious mediastinal or hilar adenopathy. No soft tissue mediastinal mass. The thoracic esophagus is unremarkable. Heart/Vascular: Atherosclerotic calcifications along the transverse aorta and coronary arteries. The heart is normal in size. No aortic aneurysm or  dissection. No pericardial effusion. Lungs/Pleura: Macro lobulated mass centered in the lingula with surrounding spiculation measures 3.6 x 2.6 x 4.2 cm. This accounts for the abnormality on the chest x-ray. Background of paraseptal and centrilobular pulmonary emphysema. Nonspecific 4 mm nodule in the posterior aspect of the left upper lobe (image 13 series 3). Bones/Soft Tissues: No acute fracture or aggressive appearing lytic or blastic osseous lesion. Upper Abdomen: Incompletely imaged renal lesions bilaterally. On the left, there is an approximately 3.3 cm centrally low-attenuation lesion with some rim calcification consistent with an at least mildly complex cyst. On the right, there is a somewhat max her lobulated 3.3 cm lesion which is incompletely imaged but appears low attenuation. IMPRESSION: 1. 4.2 cm macro lobulated lingular mass with surrounding spiculation consistent with a primary bronchogenic carcinoma. No definitive mediastinal or distant metastatic disease by CT scan. Recommend further evaluation with PET-CT. 2. Incompletely imaged low-attenuation lesions in the bilateral kidneys which are favored to represent at least mildly complex renal cysts. Recommend further evaluation of the kidneys with non emergent renal protocol CT scan of the abdomen (with and without contrast). 3. Atherosclerosis including coronary artery disease. 4. Nonspecific 4 mm nodule in the posterior aspect of the left upper lobe warrants attention on routine  follow-up imaging. 5. Pulmonary emphysema. Electronically Signed   By: Jacqulynn Cadet M.D.   On: 08/27/2015 18:26    ASSESSMENT: Lung mass.  PLAN:    1. Lung mass: CT scan results reviewed independently and reported as above. Given patient's extensive tobacco history, this is highly suspicious for underlying malignancy. PET scan was ordered, but denied by insurance therefore will proceed with CT-guided biopsy. PFTs will also be performed in case surgery is an  option. Patient will return to clinic approximately one week after his biopsy to discuss the results, and additional diagnostic and treatment planning.  Approximately 45 minutes was spent in discussion of which greater than 50% was consultation.   Patient expressed understanding and was in agreement with this plan. He also understands that He can call clinic at any time with any questions, concerns, or complaints.    Lloyd Huger, MD   09/05/2015 6:08 PM

## 2015-09-06 ENCOUNTER — Ambulatory Visit: Payer: Medicare Other

## 2015-09-06 ENCOUNTER — Ambulatory Visit
Admission: RE | Admit: 2015-09-06 | Discharge: 2015-09-06 | Disposition: A | Discharge status: Home / Self Care | Payer: Medicare Other | Source: Ambulatory Visit | Attending: Oncology | Admitting: Oncology

## 2015-09-06 ENCOUNTER — Ambulatory Visit
Admission: RE | Admit: 2015-09-06 | Discharge: 2015-09-06 | Disposition: A | Discharge status: Home / Self Care | Payer: Medicare Other | Source: Ambulatory Visit | Attending: Interventional Radiology | Admitting: Interventional Radiology

## 2015-09-06 ENCOUNTER — Inpatient Hospital Stay: Payer: Medicare Other | Admitting: Oncology

## 2015-09-06 DIAGNOSIS — J449 Chronic obstructive pulmonary disease, unspecified: Secondary | ICD-10-CM | POA: Insufficient documentation

## 2015-09-06 DIAGNOSIS — I10 Essential (primary) hypertension: Secondary | ICD-10-CM | POA: Diagnosis not present

## 2015-09-06 DIAGNOSIS — R918 Other nonspecific abnormal finding of lung field: Secondary | ICD-10-CM | POA: Insufficient documentation

## 2015-09-06 DIAGNOSIS — Z01812 Encounter for preprocedural laboratory examination: Secondary | ICD-10-CM | POA: Diagnosis present

## 2015-09-06 DIAGNOSIS — H409 Unspecified glaucoma: Secondary | ICD-10-CM | POA: Diagnosis not present

## 2015-09-06 DIAGNOSIS — IMO0001 Reserved for inherently not codable concepts without codable children: Secondary | ICD-10-CM

## 2015-09-06 DIAGNOSIS — F17210 Nicotine dependence, cigarettes, uncomplicated: Secondary | ICD-10-CM | POA: Diagnosis not present

## 2015-09-06 DIAGNOSIS — Z0189 Encounter for other specified special examinations: Secondary | ICD-10-CM | POA: Insufficient documentation

## 2015-09-06 DIAGNOSIS — C3412 Malignant neoplasm of upper lobe, left bronchus or lung: Secondary | ICD-10-CM | POA: Diagnosis not present

## 2015-09-06 LAB — CBC
HEMATOCRIT: 36.3 % — AB (ref 40.0–52.0)
Hemoglobin: 12.1 g/dL — ABNORMAL LOW (ref 13.0–18.0)
MCH: 27.8 pg (ref 26.0–34.0)
MCHC: 33.2 g/dL (ref 32.0–36.0)
MCV: 83.6 fL (ref 80.0–100.0)
Platelets: 206 10*3/uL (ref 150–440)
RBC: 4.34 MIL/uL — AB (ref 4.40–5.90)
RDW: 15.3 % — AB (ref 11.5–14.5)
WBC: 6 10*3/uL (ref 3.8–10.6)

## 2015-09-06 LAB — PROTIME-INR
INR: 1.13
Prothrombin Time: 14.7 seconds (ref 11.4–15.0)

## 2015-09-06 LAB — APTT: APTT: 27 s (ref 24–36)

## 2015-09-06 MED ORDER — MIDAZOLAM HCL 5 MG/5ML IJ SOLN
INTRAMUSCULAR | Status: AC | PRN
  Administered 2015-09-06 (×2): 1 mg via INTRAVENOUS

## 2015-09-06 MED ORDER — HYDROCODONE-ACETAMINOPHEN 5-325 MG PO TABS
1.0000 | ORAL_TABLET | Freq: Once | ORAL | Status: AC
  Administered 2015-09-06: 1 via ORAL

## 2015-09-06 MED ORDER — HYDROCODONE-ACETAMINOPHEN 5-325 MG PO TABS
ORAL_TABLET | ORAL | Status: AC
  Filled 2015-09-06: qty 1

## 2015-09-06 MED ORDER — SODIUM CHLORIDE 0.9 % IV SOLN
INTRAVENOUS | Status: DC
  Administered 2015-09-06: 08:00:00 via INTRAVENOUS

## 2015-09-06 MED ORDER — FENTANYL CITRATE (PF) 100 MCG/2ML IJ SOLN
INTRAMUSCULAR | Status: AC | PRN
  Administered 2015-09-06: 50 ug via INTRAVENOUS
  Administered 2015-09-06: 25 ug via INTRAVENOUS

## 2015-09-06 NOTE — Procedures (Signed)
Successful CT core Bx of the lingula mass No comp Stable EBL 0 Path pending Full report in pacs

## 2015-09-06 NOTE — Progress Notes (Signed)
Pt remains clinically stable post biopsy, states no pain after norco given earlier per orders, no cp nor shortness of breath, vss. Dr Reesa Chew in to speak with pt earlier with questions answered, discharge instructions given with questions answered, ready for discharge

## 2015-09-06 NOTE — H&P (Signed)
Chief Complaint: Lingula mass, here for biopsy.  Referring Physician(s): Finnegan,Timothy J    History of Present Illness: Carl Bell is a 65 y.o. male with COPD, chronic smoker, htn, and glaucoma.  workup for cough and resp symptoms revealed a 3.5 cm lingula mass c/w lung ca.  Here for outpt. CT bx today.  Only significant symptom is a chronic cough o/w no complaints.  Past Medical History  Diagnosis Date  . COPD (chronic obstructive pulmonary disease) ([place])   . Glaucoma   . Hypertension     History reviewed. No pertinent past surgical history.  Allergies: Review of patient's allergies indicates no known allergies.  Medications: Prior to Admission medications   Medication Sig Start Date End Date Taking? Authorizing Provider  albuterol (PROVENTIL HFA;VENTOLIN HFA) 108 (90 BASE) MCG/ACT inhaler Inhale 2 puffs into the lungs every 6 (six) hours as needed for wheezing or shortness of breath. 04/29/15  Yes Loney Hering, MD  amLODipine (NORVASC) 5 MG tablet Take 1 tablet (5 mg total) by mouth daily. 08/27/15 08/26/16 Yes Orbie Pyo, MD  brimonidine [facility]) 0.2 % ophthalmic solution  07/30/15  Yes Historical Provider, MD  timolol (TIMOPTIC) 0.5 % ophthalmic solution  07/30/15  Yes Historical Provider, MD  benzonatate (TESSALON PERLES) 100 MG capsule Take 1 capsule (100 mg total) by mouth 3 (three) times daily as needed for cough. Patient not taking: Reported on 09/06/2015 04/29/15 04/28/16  Loney Hering, MD  predniSONE (DELTASONE) 20 MG tablet Take 3 tablets (60 mg total) by mouth daily. Patient not taking: Reported on 09/06/2015 08/27/15 08/26/16  Orbie Pyo, MD     History reviewed. No pertinent family history.  Social History   Social History  . Marital Status: Single    Spouse Name: N/A  . Number of Children: N/A  . Years of Education: N/A   Social History Main Topics  . Smoking status: Current Every Day Smoker -- 1.00 packs/day  for 35 years    Types: Cigarettes  . Smokeless tobacco: Never Used  . Alcohol Use: 0.0 oz/week    0 Standard drinks or equivalent per week     Comment: OCCASIONAL  . Drug Use: No  . Sexual Activity: Not Asked   Other Topics Concern  . None   Social History Narrative    ECOG Status: 1 - Symptomatic but completely ambulatory  Review of Systems: A 12 point ROS discussed and pertinent positives are indicated in the HPI above.  All other systems are negative.  Review of Systems  Constitutional: Negative for fever, appetite change, fatigue and unexpected weight change.  Respiratory: Positive for cough, shortness of breath and wheezing.   Cardiovascular: Negative for chest pain and palpitations.  Gastrointestinal: Negative for abdominal pain.  Genitourinary: Negative for flank pain.    Vital Signs: BP 170/95 mmHg  Pulse 93  Temp(Src) 98.5 F (36.9 C) (Oral)  Resp 15  Ht '6\' 1"'$  (1.854 m)  Wt 150 lb (68.04 kg)  BMI 19.79 kg/m2  SpO2 97%  Physical Exam  Constitutional: He is oriented to person, place, and time. He appears well-developed and well-nourished. No distress.  Cardiovascular: Normal rate and regular rhythm.  Exam reveals no friction rub.   No murmur heard. Pulmonary/Chest: Effort normal. He has wheezes. He has rales.  Abdominal: Soft. Bowel sounds are normal. He exhibits no distension.  Neurological: He is alert and oriented to person, place, and time.  Skin: He is not diaphoretic.  Psychiatric: He  has a normal mood and affect. His behavior is normal.    Mallampati Score:   1  Imaging: Dg Chest 2 View  08/27/2015  CLINICAL DATA:  Cough and congestion for 4 days with history of COPD, patient smokes EXAM: CHEST  2 VIEW COMPARISON:  04/29/2015 FINDINGS: The heart size and vascular pattern are normal. Hyperinflation indicate COPD. There is a new rounded opacity in the lingula which is concerning for a mass. It measures about 3 cm in diameter. No pleural effusion.  IMPRESSION: CT of the thorax preferably with contrast recommended to evaluate abnormal opacity in the lingula bulges concerning for neoplasm. These results will be called to the ordering clinician or representative by the Radiologist Assistant, and communication documented in the PACS or zVision Dashboard. Electronically Signed   By: Skipper Cliche M.D.   On: 08/27/2015 16:45   Ct Chest W Contrast  08/27/2015  CLINICAL DATA:  65 year old male with cough and sneezing for the past 3 days. Possible pulmonary nodule on chest x-ray from earlier today. EXAM: CT CHEST WITH CONTRAST TECHNIQUE: Multidetector CT imaging of the chest was performed during intravenous contrast administration. CONTRAST:  12m OMNIPAQUE IOHEXOL 300 MG/ML  SOLN COMPARISON:  Prior chest x-ray 08/27/2015 FINDINGS: Mediastinum: Unremarkable CT appearance of the thyroid gland. No suspicious mediastinal or hilar adenopathy. No soft tissue mediastinal mass. The thoracic esophagus is unremarkable. Heart/Vascular: Atherosclerotic calcifications along the transverse aorta and coronary arteries. The heart is normal in size. No aortic aneurysm or dissection. No pericardial effusion. Lungs/Pleura: Macro lobulated mass centered in the lingula with surrounding spiculation measures 3.6 x 2.6 x 4.2 cm. This accounts for the abnormality on the chest x-ray. Background of paraseptal and centrilobular pulmonary emphysema. Nonspecific 4 mm nodule in the posterior aspect of the left upper lobe (image 13 series 3). Bones/Soft Tissues: No acute fracture or aggressive appearing lytic or blastic osseous lesion. Upper Abdomen: Incompletely imaged renal lesions bilaterally. On the left, there is an approximately 3.3 cm centrally low-attenuation lesion with some rim calcification consistent with an at least mildly complex cyst. On the right, there is a somewhat max her lobulated 3.3 cm lesion which is incompletely imaged but appears low attenuation. IMPRESSION: 1. 4.2 cm  macro lobulated lingular mass with surrounding spiculation consistent with a primary bronchogenic carcinoma. No definitive mediastinal or distant metastatic disease by CT scan. Recommend further evaluation with PET-CT. 2. Incompletely imaged low-attenuation lesions in the bilateral kidneys which are favored to represent at least mildly complex renal cysts. Recommend further evaluation of the kidneys with non emergent renal protocol CT scan of the abdomen (with and without contrast). 3. Atherosclerosis including coronary artery disease. 4. Nonspecific 4 mm nodule in the posterior aspect of the left upper lobe warrants attention on routine follow-up imaging. 5. Pulmonary emphysema. Electronically Signed   By: HJacqulynn CadetM.D.   On: 08/27/2015 18:26    Labs:  CBC:  Recent Labs  04/28/15 2249 08/27/15 1617  WBC 6.4 4.5  HGB 12.1* 12.4*  HCT 37.2* 37.9*  PLT 141* 137*    COAGS: No results for input(s): INR, APTT in the last 8760 hours.  BMP:  Recent Labs  04/28/15 2249 08/27/15 1617  NA 139 137  K 3.5 4.2  CL 105 103  CO2 24 27  GLUCOSE 96 99  BUN 10 13  CALCIUM 8.6* 9.1  CREATININE 0.71 0.86  GFRNONAA >60 >60  GFRAA >60 >60    LIVER FUNCTION TESTS:  Recent Labs  08/27/15 1617  BILITOT 1.0  AST 22  ALT 13*  ALKPHOS 65  PROT 6.9  ALBUMIN 3.6    TUMOR MARKERS: No results for input(s): AFPTM, CEA, CA199, CHROMGRNA in the last 8760 hours.  Assessment and Plan:  3.5 cm anterior lingula mass, consistent with lung ca.  Plan for CT core bx today.  Consent obtained including risk of ptx, pulmonary hemorrhage, resp. Failure and death.  Thank you for this interesting consult.  I greatly enjoyed meeting OMARIO ANDER and look forward to participating in their care.  A copy of this report was sent to the requesting provider on this date.  Electronically Signed: Greggory Keen 09/06/2015, 8:03 AM   I spent a total of  30 Minutes   in face to face in clinical  consultation, greater than 50% of which was counseling/coordinating care for a new lungula mass for Bx today.

## 2015-09-07 ENCOUNTER — Other Ambulatory Visit: Payer: Self-pay | Admitting: Oncology

## 2015-09-07 LAB — SURGICAL PATHOLOGY

## 2015-09-11 ENCOUNTER — Ambulatory Visit: Payer: Medicare Other | Attending: Oncology

## 2015-09-11 DIAGNOSIS — R918 Other nonspecific abnormal finding of lung field: Secondary | ICD-10-CM | POA: Diagnosis not present

## 2015-09-11 MED ORDER — ALBUTEROL SULFATE (2.5 MG/3ML) 0.083% IN NEBU
2.5000 mg | INHALATION_SOLUTION | Freq: Once | RESPIRATORY_TRACT | Status: AC
  Administered 2015-09-11: 2.5 mg via RESPIRATORY_TRACT
  Filled 2015-09-11: qty 3

## 2015-09-13 ENCOUNTER — Ambulatory Visit
Admission: RE | Admit: 2015-09-13 | Discharge: 2015-09-13 | Disposition: A | Discharge status: Home / Self Care | Payer: Medicare Other | Source: Ambulatory Visit | Attending: Oncology | Admitting: Oncology

## 2015-09-13 DIAGNOSIS — R590 Localized enlarged lymph nodes: Secondary | ICD-10-CM | POA: Diagnosis not present

## 2015-09-13 DIAGNOSIS — N281 Cyst of kidney, acquired: Secondary | ICD-10-CM | POA: Diagnosis not present

## 2015-09-13 DIAGNOSIS — R918 Other nonspecific abnormal finding of lung field: Secondary | ICD-10-CM | POA: Insufficient documentation

## 2015-09-13 DIAGNOSIS — R188 Other ascites: Secondary | ICD-10-CM | POA: Insufficient documentation

## 2015-09-13 DIAGNOSIS — K4090 Unilateral inguinal hernia, without obstruction or gangrene, not specified as recurrent: Secondary | ICD-10-CM | POA: Diagnosis not present

## 2015-09-13 LAB — GLUCOSE, CAPILLARY: GLUCOSE-CAPILLARY: 59 mg/dL — AB (ref 65–99)

## 2015-09-13 MED ORDER — FLUDEOXYGLUCOSE F - 18 (FDG) INJECTION: 11.9900 | Freq: Once | INTRAVENOUS | Status: DC | PRN

## 2015-09-17 ENCOUNTER — Inpatient Hospital Stay: Payer: Medicare Other | Attending: Oncology | Admitting: Oncology

## 2015-09-17 ENCOUNTER — Ambulatory Visit
Admission: RE | Admit: 2015-09-17 | Discharge: 2015-09-17 | Disposition: A | Discharge status: Home / Self Care | Payer: Medicare Other | Source: Ambulatory Visit | Attending: Radiation Oncology | Admitting: Radiation Oncology

## 2015-09-17 ENCOUNTER — Encounter: Payer: Self-pay | Admitting: Radiation Oncology

## 2015-09-17 VITALS — BP 175/102 | HR 94 | Wt 147.3 lb

## 2015-09-17 DIAGNOSIS — F17210 Nicotine dependence, cigarettes, uncomplicated: Secondary | ICD-10-CM | POA: Diagnosis not present

## 2015-09-17 DIAGNOSIS — I2510 Atherosclerotic heart disease of native coronary artery without angina pectoris: Secondary | ICD-10-CM | POA: Diagnosis not present

## 2015-09-17 DIAGNOSIS — Z79899 Other long term (current) drug therapy: Secondary | ICD-10-CM | POA: Diagnosis not present

## 2015-09-17 DIAGNOSIS — J449 Chronic obstructive pulmonary disease, unspecified: Secondary | ICD-10-CM | POA: Diagnosis not present

## 2015-09-17 DIAGNOSIS — N281 Cyst of kidney, acquired: Secondary | ICD-10-CM | POA: Diagnosis not present

## 2015-09-17 DIAGNOSIS — C3412 Malignant neoplasm of upper lobe, left bronchus or lung: Secondary | ICD-10-CM | POA: Diagnosis present

## 2015-09-17 DIAGNOSIS — K4090 Unilateral inguinal hernia, without obstruction or gangrene, not specified as recurrent: Secondary | ICD-10-CM | POA: Diagnosis not present

## 2015-09-17 DIAGNOSIS — I10 Essential (primary) hypertension: Secondary | ICD-10-CM | POA: Insufficient documentation

## 2015-09-17 DIAGNOSIS — C3432 Malignant neoplasm of lower lobe, left bronchus or lung: Secondary | ICD-10-CM

## 2015-09-17 DIAGNOSIS — C3490 Malignant neoplasm of unspecified part of unspecified bronchus or lung: Secondary | ICD-10-CM

## 2015-09-17 DIAGNOSIS — Z9221 Personal history of antineoplastic chemotherapy: Secondary | ICD-10-CM | POA: Insufficient documentation

## 2015-09-17 DIAGNOSIS — Z510 Encounter for antineoplastic radiation therapy: Secondary | ICD-10-CM | POA: Insufficient documentation

## 2015-09-17 MED ORDER — AMLODIPINE BESYLATE 5 MG PO TABS: 5.0000 mg | ORAL_TABLET | Freq: Every day | ORAL | Status: DC

## 2015-09-17 NOTE — Progress Notes (Signed)
Patient is here to discuss PET scan results.  His bp is elevated at 172/102 but he has not taken his bp medication today.

## 2015-09-17 NOTE — Consult Note (Signed)
Except an outstanding is perfect of Radiation Oncology NEW PATIENT EVALUATION  Name: Carl Bell  MRN: [id number]  Date:   09/17/2015     DOB: 23-Oct-1950   This 65 y.o. male patient presents to the clinic for initial evaluation of lung cancer squamous cell carcinoma the left upper lobe stage IIIa (T2 N2 M0).  REFERRING PHYSICIAN: Kirk Ruths, MD  CHIEF COMPLAINT:  Chief Complaint  Patient presents with  . Lung Cancer    Initial evaluation of radiation treatments for lung cancer    DIAGNOSIS: The encounter diagnosis was Malignant neoplasm of lung, unspecified laterality, unspecified part of lung ([place]).   PREVIOUS INVESTIGATIONS:  PET CT and CT scans reviewed Pathology report reviewed Clinical notes reviewed  HPI: Patient is a 65 year old male presented in 50-pack-year smoking history who presented to emergency room with increasing shortness of breath and dyspnea on exertion. CT scan revealed a lingular lung mass. CT scan guided biopsy was performed positive for squamous cell carcinoma. PET CT scan demonstrated a 3. a hypermetabolic lingular mass consistent with primary bronchogenic carcinoma. There also is a 1 cm subcarinal mediastinal node with 3.0 SUV consistent with mediastinal nodal involvement. No other evidence of distant metastatic disease was noted. Patient is seen today for opinion. His palmar functions are borderline according to medical oncology. He is referred to radiation collagen today for opinion. He has an MRI scan of his brain pending.  PLANNED TREATMENT REGIMEN: Concurrent chemotherapy and radiation therapy  PAST MEDICAL HISTORY:  has a past medical history of COPD (chronic obstructive pulmonary disease) ([place]); Glaucoma; and Hypertension.    PAST SURGICAL HISTORY: History reviewed. No pertinent past surgical history.  FAMILY HISTORY: family history is not on file.  SOCIAL HISTORY:  reports that he has been smoking Cigarettes.  He has a 35 pack-year smoking  history. He has never used smokeless tobacco. He reports that he drinks alcohol. He reports that he does not use illicit drugs.  ALLERGIES: Review of patient's allergies indicates no known allergies.  MEDICATIONS:  Current Outpatient Prescriptions  Medication Sig Dispense Refill  . albuterol (PROVENTIL HFA;VENTOLIN HFA) 108 (90 BASE) MCG/ACT inhaler Inhale 2 puffs into the lungs every 6 (six) hours as needed for wheezing or shortness of breath. 1 Inhaler 0  . amLODipine (NORVASC) 5 MG tablet Take 1 tablet (5 mg total) by mouth daily. 30 tablet 1  . brimonidine (ALPHAGAN) 0.2 % ophthalmic solution   4  . timolol (TIMOPTIC) 0.5 % ophthalmic solution   4   No current facility-administered medications for this encounter.   Facility-Administered Medications Ordered in Other Encounters  Medication Dose Route Frequency Provider Last Rate Last Dose  . fludeoxyglucose F - 18 (FDG) injection 11.99 milli Curie  11.99 milli Curie Intravenous Once PRN Lloyd Huger, MD        ECOG PERFORMANCE STATUS:  1 - Symptomatic but completely ambulatory  REVIEW OF SYSTEMS:  Patient denies any weight loss, fatigue, weakness, fever, chills or night sweats. Patient denies any loss of vision, blurred vision. Patient denies any ringing  of the ears or hearing loss. No irregular heartbeat. Patient denies heart murmur or history of fainting. Patient denies any chest pain or pain radiating to her upper extremities. Patient denies any shortness of breath, difficulty breathing at night, cough or hemoptysis. Patient denies any swelling in the lower legs. Patient denies any nausea vomiting, vomiting of blood, or coffee ground material in the vomitus. Patient denies any stomach pain. Patient states has  had normal bowel movements no significant constipation or diarrhea. Patient denies any dysuria, hematuria or significant nocturia. Patient denies any problems walking, swelling in the joints or loss of balance. Patient denies  any skin changes, loss of hair or loss of weight. Patient denies any excessive worrying or anxiety or significant depression. Patient denies any problems with insomnia. Patient denies excessive thirst, polyuria, polydipsia. Patient denies any swollen glands, patient denies easy bruising or easy bleeding. Patient denies any recent infections, allergies or URI. Patient "s visual fields have not changed significantly in recent time.    PHYSICAL EXAM: There were no vitals taken for this visit. Well-developed well-nourished patient in NAD. HEENT reveals PERLA, EOMI, discs not visualized.  Oral cavity is clear. No oral mucosal lesions are identified. Neck is clear without evidence of cervical or supraclavicular adenopathy. Lungs are clear to A&P. Cardiac examination is essentially unremarkable with regular rate and rhythm without murmur rub or thrill. Abdomen is benign with no organomegaly or masses noted. Motor sensory and DTR levels are equal and symmetric in the upper and lower extremities. Cranial nerves II through XII are grossly intact. Proprioception is intact. No peripheral adenopathy or edema is identified. No motor or sensory levels are noted. Crude visual fields are within normal range.  LABORATORY DATA: Pathology reports reviewed    RADIOLOGY RESULTS: CT scans and PET/CT scan reviewed   IMPRESSION: Stage IIIa squamous cell carcinoma the left upper lobe in 65 year old male for concurrent chemoradiation.  PLAN: At this time I believe patient has pending MRI scans to complete his staging workup. Also will be seen by surgical oncology. Based on high probability of subcarinal lymph node involvement making this a stage IIIa disease would recommend concurrent chemoradiation. Would take his chest tumor up to 6600 cGy using I am RT treatment planning and delivery as I feel this would best accomplished decreasing dose to normal lung as well as heart based on the close proximity of the tumor to his cardiac  tissue. Will make that final determination at the time of simulation. Risks and benefits of radiation including possible dysphasia secondary to radiation esophagitis, fatigue, alteration of blood counts, nor loss of normal lung volume skin reaction all were discussed in detail with the patient. I have set up and ordered CT simulation for later this week. I've discussed the case personally with medical oncology who will be coordinating his chemotherapy.  I would like to take this opportunity for allowing me to participate in the care of your patient.Armstead Peaks., MD

## 2015-09-18 ENCOUNTER — Other Ambulatory Visit: Payer: Self-pay | Admitting: Vascular Surgery

## 2015-09-20 ENCOUNTER — Ambulatory Visit
Admission: RE | Admit: 2015-09-20 | Discharge: 2015-09-20 | Disposition: A | Discharge status: Home / Self Care | Payer: Medicare Other | Source: Ambulatory Visit | Attending: Radiation Oncology | Admitting: Radiation Oncology

## 2015-09-20 ENCOUNTER — Inpatient Hospital Stay: Payer: Medicare Other | Admitting: Cardiothoracic Surgery

## 2015-09-20 DIAGNOSIS — Z9221 Personal history of antineoplastic chemotherapy: Secondary | ICD-10-CM | POA: Diagnosis not present

## 2015-09-20 DIAGNOSIS — I10 Essential (primary) hypertension: Secondary | ICD-10-CM | POA: Diagnosis not present

## 2015-09-20 DIAGNOSIS — Z510 Encounter for antineoplastic radiation therapy: Secondary | ICD-10-CM | POA: Diagnosis not present

## 2015-09-20 DIAGNOSIS — Z79899 Other long term (current) drug therapy: Secondary | ICD-10-CM | POA: Diagnosis not present

## 2015-09-20 DIAGNOSIS — F17210 Nicotine dependence, cigarettes, uncomplicated: Secondary | ICD-10-CM | POA: Diagnosis not present

## 2015-09-20 DIAGNOSIS — C3412 Malignant neoplasm of upper lobe, left bronchus or lung: Secondary | ICD-10-CM | POA: Diagnosis not present

## 2015-09-20 DIAGNOSIS — J449 Chronic obstructive pulmonary disease, unspecified: Secondary | ICD-10-CM | POA: Diagnosis not present

## 2015-09-21 DIAGNOSIS — C3412 Malignant neoplasm of upper lobe, left bronchus or lung: Secondary | ICD-10-CM | POA: Diagnosis not present

## 2015-09-23 DIAGNOSIS — C3410 Malignant neoplasm of upper lobe, unspecified bronchus or lung: Secondary | ICD-10-CM | POA: Insufficient documentation

## 2015-09-23 NOTE — Progress Notes (Signed)
Carl  [phone] [phone]) [phone]  ID: Carl Bell OB: 13-Apr-1951  MR#: [id number]  [facility]  Patient Care Team: Kirk Ruths, MD as PCP - General (Internal Medicine)  CHIEF COMPLAINT:  Chief Complaint  Patient presents with  . Lung Mass    INTERVAL HISTORY: Patient returns to clinic today for discussion of his biopsy results, PET scan, and treatment planning. He continues to feel well and asymptomatic. He has no neurologic complaints. He denies any recent fevers. He has a good appetite and denies weight loss. He continues to have a cough, but denies chest pain or shortness of breath. He denies any hemoptysis. He denies any nausea, vomiting, constipation, or diarrhea. He has no urinary complaints. Patient offers no further specific complaints  REVIEW OF SYSTEMS:   Review of Systems  Constitutional: Negative for fever, weight loss and malaise/fatigue.  Respiratory: Positive for cough. Negative for hemoptysis and shortness of breath.   Cardiovascular: Negative.  Negative for chest pain.  Gastrointestinal: Negative.   Musculoskeletal: Negative.   Neurological: Negative.  Negative for weakness.  Psychiatric/Behavioral: The patient is nervous/anxious.     As per HPI. Otherwise, a complete review of systems is negatve.  PAST MEDICAL HISTORY: Past Medical History  Diagnosis Date  . COPD (chronic obstructive pulmonary disease) ([place])   . Glaucoma   . Hypertension     PAST SURGICAL HISTORY: No past surgical history on file.  FAMILY HISTORY: No reported history of malignancy or chronic disease.     ADVANCED DIRECTIVES:    HEALTH MAINTENANCE: Social History  Substance Use Topics  . Smoking status: Current Every Day Smoker -- 1.00 packs/day for 35 years    Types: Cigarettes  . Smokeless tobacco: Never Used  . Alcohol Use: 0.0 oz/week    0 Standard drinks or equivalent per week     Comment: OCCASIONAL      Colonoscopy:  PAP:  Bone density:  Lipid panel:  No Known Allergies  Current Outpatient Prescriptions  Medication Sig Dispense Refill  . albuterol (PROVENTIL HFA;VENTOLIN HFA) 108 (90 BASE) MCG/ACT inhaler Inhale 2 puffs into the lungs every 6 (six) hours as needed for wheezing or shortness of breath. 1 Inhaler 0  . amLODipine (NORVASC) 5 MG tablet Take 1 tablet (5 mg total) by mouth daily. 30 tablet 1  . brimonidine (ALPHAGAN) 0.2 % ophthalmic solution   4  . timolol (TIMOPTIC) 0.5 % ophthalmic solution   4   No current facility-administered medications for this visit.    OBJECTIVE: Filed Vitals:   09/17/15 0847  BP: 175/102  Pulse: 94     Body mass index is 19.43 kg/(m^2).    ECOG FS:0 - Asymptomatic  General: Well-developed, well-nourished, no acute distress. Eyes: Pink conjunctiva, anicteric sclera. Lungs: Clear to auscultation bilaterally. Heart: Regular rate and rhythm. No rubs, murmurs, or gallops. Abdomen: Soft, nontender, nondistended. No organomegaly noted, normoactive bowel sounds. Musculoskeletal: No edema, cyanosis, or clubbing. Neuro: Alert, answering all questions appropriately. Cranial nerves grossly intact. Skin: No rashes or petechiae noted. Psych: Normal affect.   LAB RESULTS:  Lab Results  Component Value Date   NA 137 08/27/2015   K 4.2 08/27/2015   CL 103 08/27/2015   CO2 27 08/27/2015   GLUCOSE 99 08/27/2015   BUN 13 08/27/2015   CREATININE 0.86 08/27/2015   CALCIUM 9.1 08/27/2015   PROT 6.9 08/27/2015   ALBUMIN 3.6 08/27/2015   AST 22 08/27/2015   ALT 13* 08/27/2015   ALKPHOS  65 08/27/2015   BILITOT 1.0 08/27/2015   GFRNONAA >60 08/27/2015   GFRAA >60 08/27/2015    Lab Results  Component Value Date   WBC 6.0 09/06/2015   HGB 12.1* 09/06/2015   HCT 36.3* 09/06/2015   MCV 83.6 09/06/2015   PLT 206 09/06/2015     STUDIES: Dg Chest 1 View  09/06/2015  CLINICAL DATA:  Status post lingula mass biopsy EXAM: CHEST 1 VIEW  COMPARISON:  09/06/2015 FINDINGS: The lingula mass is again noted. Background COPD/emphysema evident with hyperinflation. No focal pneumonia, collapse or consolidation. No edema, effusion or pneumothorax. Normal heart size and vascularity. Trachea midline. Monitor leads overlie the chest. No complicating feature. IMPRESSION: Stable lingula mass.  No effusion or pneumothorax following biopsy. Electronically Signed   By: Jerilynn Mages.  Shick M.D.   On: 09/06/2015 11:13   Dg Chest 2 View  08/27/2015  CLINICAL DATA:  Cough and congestion for 4 days with history of COPD, patient smokes EXAM: CHEST  2 VIEW COMPARISON:  04/29/2015 FINDINGS: The heart size and vascular pattern are normal. Hyperinflation indicate COPD. There is a new rounded opacity in the lingula which is concerning for a mass. It measures about 3 cm in diameter. No pleural effusion. IMPRESSION: CT of the thorax preferably with contrast recommended to evaluate abnormal opacity in the lingula bulges concerning for neoplasm. These results will be called to the ordering clinician or representative by the Radiologist Assistant, and communication documented in the PACS or zVision Dashboard. Electronically Signed   By: Skipper Cliche M.D.   On: 08/27/2015 16:45   Ct Chest W Contrast  08/27/2015  CLINICAL DATA:  65 year old male with cough and sneezing for the past 3 days. Possible pulmonary nodule on chest x-ray from earlier today. EXAM: CT CHEST WITH CONTRAST TECHNIQUE: Multidetector CT imaging of the chest was performed during intravenous contrast administration. CONTRAST:  70m OMNIPAQUE IOHEXOL 300 MG/ML  SOLN COMPARISON:  Prior chest x-ray 08/27/2015 FINDINGS: Mediastinum: Unremarkable CT appearance of the thyroid gland. No suspicious mediastinal or hilar adenopathy. No soft tissue mediastinal mass. The thoracic esophagus is unremarkable. Heart/Vascular: Atherosclerotic calcifications along the transverse aorta and coronary arteries. The heart is normal in size.  No aortic aneurysm or dissection. No pericardial effusion. Lungs/Pleura: Macro lobulated mass centered in the lingula with surrounding spiculation measures 3.6 x 2.6 x 4.2 cm. This accounts for the abnormality on the chest x-ray. Background of paraseptal and centrilobular pulmonary emphysema. Nonspecific 4 mm nodule in the posterior aspect of the left upper lobe (image 13 series 3). Bones/Soft Tissues: No acute fracture or aggressive appearing lytic or blastic osseous lesion. Upper Abdomen: Incompletely imaged renal lesions bilaterally. On the left, there is an approximately 3.3 cm centrally low-attenuation lesion with some rim calcification consistent with an at least mildly complex cyst. On the right, there is a somewhat max her lobulated 3.3 cm lesion which is incompletely imaged but appears low attenuation. IMPRESSION: 1. 4.2 cm macro lobulated lingular mass with surrounding spiculation consistent with a primary bronchogenic carcinoma. No definitive mediastinal or distant metastatic disease by CT scan. Recommend further evaluation with PET-CT. 2. Incompletely imaged low-attenuation lesions in the bilateral kidneys which are favored to represent at least mildly complex renal cysts. Recommend further evaluation of the kidneys with non emergent renal protocol CT scan of the abdomen (with and without contrast). 3. Atherosclerosis including coronary artery disease. 4. Nonspecific 4 mm nodule in the posterior aspect of the left upper lobe warrants attention on routine follow-up imaging. 5. Pulmonary  emphysema. Electronically Signed   By: Jacqulynn Cadet M.D.   On: 08/27/2015 18:26   Nm Pet Image Initial (pi) Skull Base To Thigh  09/13/2015  CLINICAL DATA:  Initial treatment strategy for lingular squamous cell carcinoma. EXAM: NUCLEAR MEDICINE PET SKULL BASE TO THIGH TECHNIQUE: 12.0 mCi F-18 FDG was injected intravenously. Full-ring PET imaging was performed from the skull base to thigh after the radiotracer. CT  data was obtained and used for attenuation correction and anatomic localization. FASTING BLOOD GLUCOSE:  Value: 59 mg/dl COMPARISON:  Chest CT on 06/25/2016 FINDINGS: NECK No hypermetabolic lymph nodes in the neck. CHEST A spiculated mass within the inferior aspect of the lingula currently measures 2.8 x 3.8 cm on image 119/ series 3 compared to 2.6 x 3.6 cm previously. This has an SUV max of 20.6. 4 mm pulmonary nodule in the posterior left upper lobe on image 65/series 3 is stable. This shows no metabolic activity but is too small to characterize by PET. Mild emphysema again noted. No evidence of pleural effusion. A 10 mm subcarinal mediastinal lymph node shows mild hypermetabolic activity with SUV max of 3.2. No hypermetabolic hilar lymph nodes identified. ABDOMEN/PELVIS No abnormal hypermetabolic activity within the liver, pancreas, adrenal glands, or spleen. Several small cystic lesions are seen in both kidneys some of which contain peripheral calcification. None of these lesions shows hypermetabolic activity, and these are most consistent with benign complex renal cysts. No hypermetabolic lymph nodes in the abdomen or pelvis. Mild ascites noted within the pelvis. A large right inguinal hernia is seen containing small bowel and colon. No evidence of bowel ischemia or obstruction. SKELETON No focal hypermetabolic activity to suggest skeletal metastasis. IMPRESSION: 3.8 cm hypermetabolic lingular mass, consistent with primary bronchogenic carcinoma. 10 mm subcarinal mediastinal lymph node with mild hypermetabolic activity. Mediastinal lymph node metastasis cannot be excluded. No evidence of metastatic disease within the neck, abdomen, or pelvis. 4 mm posterior left upper lobe pulmonary nodule and complex bilateral renal cysts, which show no metabolic activity and are likely benign. Continued attention recommended on follow-up imaging. Large right inguinal hernia and mild ascites. Electronically Signed   By: Earle Gell M.D.   On: 09/13/2015 10:37   Ct Biopsy  09/06/2015  INDICATION: 3.5 cm lingula mass concerning for bronchogenic lung carcinoma EXAM: CT-GUIDED BIOPSY LINGULA MASS MEDICATIONS: 1% LIDOCAINE LOCALLY ANESTHESIA/SEDATION: 2 mg IV Versed; 75 mcg IV Fentanyl Moderate Sedation Time:  20 The patient was continuously monitored during the procedure by the interventional radiology nurse under my direct supervision. PROCEDURE: The procedure, risks, benefits, and alternatives were explained to the patient. Questions regarding the procedure were encouraged and answered. The patient understands and consents to the procedure. The LEFT ANTERIOR CHEST was prepped with chlorhexidine in a sterile fashion, and a sterile drape was applied covering the operative field. A sterile gown and sterile gloves were used for the procedure. Previous imaging reviewed. Patient positioned supine. Noncontrast localization CT performed. The lingula mass was localized. Under sterile conditions and CT guidance, a(n) 17 gauge 6.8 cm guide needle was advanced into the lingula mass from a anterior oblique approach. 3 18 gauge core biopsies obtained and placed in formalin. The guide needle was removed with insertion of the Biosentry device for pneumothorax prevention. Final imaging was performed. Patient tolerated the procedure well without complication. Vital sign monitoring by nursing staff during the procedure will continue as patient is in the special procedures unit for post procedure observation. FINDINGS: The images document guide needle placement  within the lingula mass. Post biopsy images demonstrate no hemorrhage, effusion or pneumothorax. COMPLICATIONS: None immediate. IMPRESSION: Successful CT-guided core biopsy of the lingula mass Electronically Signed   By: Jerilynn Mages.  Shick M.D.   On: 09/06/2015 09:25    ASSESSMENT: Stage IIIa squamous cell carcinoma of the lung.  PLAN:    1. Lung cancer: Biopsy and PET scan results reviewed  independently. Patient subcarinal lymph node is only mildly hypermetabolic, but cannot rule out metastasis. Patient will benefit from concurrent chemotherapy and radiation in the future and he has a consultation with radiation oncology later today. Patient will require an MRI of his brain as well as a port prior to initiating treatment. Return to clinic in approximately one week for further evaluation.  Approximately 30 minutes was spent in discussion of which greater than 50% was consultation.   Patient expressed understanding and was in agreement with this plan. He also understands that He can call clinic at any time with any questions, concerns, or complaints.    Lloyd Huger, MD   09/23/2015 8:19 AM

## 2015-09-24 ENCOUNTER — Encounter: Payer: Self-pay | Admitting: *Deleted

## 2015-09-24 ENCOUNTER — Encounter
Admission: RE | Disposition: A | Discharge status: Home / Self Care | Payer: Self-pay | Source: Ambulatory Visit | Attending: Vascular Surgery

## 2015-09-24 ENCOUNTER — Ambulatory Visit
Admission: RE | Admit: 2015-09-24 | Discharge: 2015-09-24 | Disposition: A | Discharge status: Home / Self Care | Payer: Medicare Other | Source: Ambulatory Visit | Attending: Vascular Surgery | Admitting: Vascular Surgery

## 2015-09-24 DIAGNOSIS — F17210 Nicotine dependence, cigarettes, uncomplicated: Secondary | ICD-10-CM | POA: Insufficient documentation

## 2015-09-24 DIAGNOSIS — J449 Chronic obstructive pulmonary disease, unspecified: Secondary | ICD-10-CM | POA: Insufficient documentation

## 2015-09-24 DIAGNOSIS — C3490 Malignant neoplasm of unspecified part of unspecified bronchus or lung: Secondary | ICD-10-CM | POA: Diagnosis not present

## 2015-09-24 DIAGNOSIS — I10 Essential (primary) hypertension: Secondary | ICD-10-CM | POA: Diagnosis not present

## 2015-09-24 DIAGNOSIS — H409 Unspecified glaucoma: Secondary | ICD-10-CM | POA: Insufficient documentation

## 2015-09-24 HISTORY — PX: PERIPHERAL VASCULAR CATHETERIZATION: SHX172C

## 2015-09-24 SURGERY — PORTA CATH INSERTION
Anesthesia: Moderate Sedation

## 2015-09-24 MED ORDER — FENTANYL CITRATE (PF) 100 MCG/2ML IJ SOLN
INTRAMUSCULAR | Status: DC | PRN
  Administered 2015-09-24 (×2): 50 ug via INTRAVENOUS

## 2015-09-24 MED ORDER — LIDOCAINE-EPINEPHRINE (PF) 1 %-1:200000 IJ SOLN
INTRAMUSCULAR | Status: AC
  Filled 2015-09-24: qty 30

## 2015-09-24 MED ORDER — HEPARIN (PORCINE) IN NACL 2-0.9 UNIT/ML-% IJ SOLN
INTRAMUSCULAR | Status: AC
  Filled 2015-09-24: qty 500

## 2015-09-24 MED ORDER — ONDANSETRON HCL 4 MG/2ML IJ SOLN: 4.0000 mg | Freq: Four times a day (QID) | INTRAMUSCULAR | Status: DC | PRN

## 2015-09-24 MED ORDER — HYDROMORPHONE HCL 1 MG/ML IJ SOLN: 1.0000 mg | Freq: Once | INTRAMUSCULAR | Status: DC

## 2015-09-24 MED ORDER — FENTANYL CITRATE (PF) 100 MCG/2ML IJ SOLN
INTRAMUSCULAR | Status: AC
  Filled 2015-09-24: qty 2

## 2015-09-24 MED ORDER — SODIUM CHLORIDE 0.9 % IR SOLN
Freq: Once | Status: AC
  Administered 2015-09-24: 13:00:00
  Filled 2015-09-24: qty 2

## 2015-09-24 MED ORDER — DEXTROSE 5 % IV SOLN
1.5000 g | INTRAVENOUS | Status: AC
  Administered 2015-09-24: 1.5 g via INTRAVENOUS

## 2015-09-24 MED ORDER — MIDAZOLAM HCL 2 MG/2ML IJ SOLN
INTRAMUSCULAR | Status: DC | PRN
  Administered 2015-09-24: 2 mg via INTRAVENOUS
  Administered 2015-09-24: 1 mg via INTRAVENOUS

## 2015-09-24 MED ORDER — MIDAZOLAM HCL 5 MG/5ML IJ SOLN
INTRAMUSCULAR | Status: AC
  Filled 2015-09-24: qty 5

## 2015-09-24 MED ORDER — SODIUM CHLORIDE 0.9 % IV SOLN
INTRAVENOUS | Status: DC
  Administered 2015-09-24 (×2): via INTRAVENOUS

## 2015-09-24 SURGICAL SUPPLY — 10 items
BAG DECANTER STRL (MISCELLANEOUS) ×2 IMPLANT
KIT PORT POWER 8FR ISP CVUE (Catheter) ×1 IMPLANT
PACK ANGIOGRAPHY (CUSTOM PROCEDURE TRAY) ×2 IMPLANT
PAD GROUND ADULT SPLIT (MISCELLANEOUS) ×2 IMPLANT
PENCIL ELECTRO HAND CTR (MISCELLANEOUS) ×2 IMPLANT
PREP CHG 10.5 TEAL (MISCELLANEOUS) ×2 IMPLANT
SUT MNCRL AB 4-0 PS2 18 (SUTURE) ×2 IMPLANT
SUT PROLENE 0 CT 1 30 (SUTURE) ×2 IMPLANT
SUTURE VIC 3-0 (SUTURE) ×2 IMPLANT
TOWEL OR 17X26 4PK STRL BLUE (TOWEL DISPOSABLE) ×2 IMPLANT

## 2015-09-24 NOTE — Progress Notes (Signed)
Pt doing well post port placement, taking po's without difficulty, denies complaints, Dr Lucky Cowboy out to speak with pt. Discharge instructions given with questions answered, no complaints, ready for discharge.

## 2015-09-24 NOTE — Discharge Instructions (Signed)
, Care After °Refer to this sheet in the next few weeks. These instructions provide you with information on caring for yourself after your procedure. Your caregiver may also give you more specific instructions. Your treatment has been planned according to current medical practices, but problems sometimes occur. Call your caregiver if you have any problems or questions after your procedure.  °HOME CARE INSTRUCTIONS °· Rest at home the day of the procedure. You will likely be able to return to normal activities the following day. °· Follow your caregiver's specific instructions for the type of device that you have. °· Only take over-the-counter or prescription medicines as directed by your caregiver. °· Keep the insertion site of the catheter clean and dry at all times. °¨ Change the bandages (dressings) over the catheter site as directed by your caregiver. °¨ Wash the area around the catheter site during each dressing change. Sponge bathe the area using a germ-killing (antiseptic) solution as directed by your caregiver. °¨ Look for redness or swelling at the insertion site during each dressing change. °· Apply an antibiotic ointment as directed by your caregiver. °· Flush your catheter as directed to keep it from becoming clogged. °· Always wash your hands thoroughly before changing dressings or flushing the catheter. °· Do not let air enter the catheter. °¨ Never open the cap at the catheter tip. °¨ Always make sure there is no air in the syringe or in the tubing for infusions.    °· Do not lift anything heavy. °· Do not drive until your caregiver approves. °· Do not shower or bathe until your caregiver approves. When you shower or bathe, place a piece of plastic wrap over the catheter site. Do not allow the catheter site or the dressing to get wet. If taking a bath, do not allow the catheter to get submerged in the water. °If the catheter was inserted through an arm vein:  °· Avoid wearing tight clothes or jewelry  on the arm that has the catheter.   °· Do not sleep with your head on the arm that has the catheter.   °· Do not allow use of a blood pressure cuff on the arm that has the catheter.   °· Do not let anyone draw blood from the arm that has the catheter, except through the catheter itself. °SEEK MEDICAL CARE IF: °· You have bleeding at the insertion site of the catheter.   °· You feel weak or nauseous.   °· Your catheter is not working properly.   °· You have redness, pain, swelling, and warmth at the insertion site.   °· You notice fluid draining from the insertion site.   °SEEK IMMEDIATE MEDICAL CARE IF: °· Your catheter breaks or has a hole in it.   °· Your catheter comes loose or gets pulled completely out. If this happens, hold firm pressure over the area with your hand or a clean cloth.   °· You have a fever. °· You have chills.   °· Your catheter becomes totally blocked.   °· You have swelling in your arm, shoulder, neck, or face.   °· You have bleeding from the insertion site that does not stop.   °· You develop chest pain or have trouble breathing.   °· You feel dizzy or faint.   °MAKE SURE YOU: °· Understand these instructions. °· Will watch your condition. °· Will get help right away if you are not doing well or get worse. °  °This information is not intended to replace advice given to you by your health care provider. Make sure you discuss any questions you   have with your health care provider. °  °Document Released: 06/16/2012 Document Revised: 03/02/2013 Document Reviewed: 06/16/2012 °Elsevier Interactive Patient Education ©2016 [employer] ° °

## 2015-09-24 NOTE — Op Note (Signed)
      [facility]       Operative Note  Date: 09/24/2015  Preoperative diagnosis:  1.  lung cancer  Postoperative diagnosis:  Same as above  Procedures: #1. Ultrasound guidance for vascular access to the right internal jugular vein. #2. Fluoroscopic guidance for placement of catheter. #3. Placement of CT compatible Port-A-Cath, right internal jugular vein.  Surgeon: Leotis Pain, MD.   Anesthesia: Local with moderate conscious sedation for approximately 25  minutes using 3 mg of Versed and 100 mcg of Fentanyl  Fluoroscopy time: less than 1 minute  Contrast used: 0  Estimated blood loss: 20 cc  Indication for the procedure:  The patient is a 65 y.o.male with lung cancer.  The patient needs a Port-A-Cath for durable venous access, chemotherapy, lab draws, and CT scans. We are asked to place this. Risks and benefits were discussed and informed consent was obtained.  Description of procedure: The patient was brought to the vascular and interventional radiology suite.  Moderate conscious sedation was administered throughout the procedure with my supervision of the RN administering medicines and monitoring the patient's vital signs, pulse oximetry, telemetry and mental status throughout from the start of the procedure until the patient was taken to the recovery room. The right neck chest and shoulder were sterilely prepped and draped, and a sterile surgical field was created. Ultrasound was used to help visualize a patent right internal jugular vein. This was then accessed under direct ultrasound guidance without difficulty with the Seldinger needle and a permanent image was recorded. A J-wire was placed. After skin nick and dilatation, the peel-away sheath was then placed over the wire. I then anesthetized an area under the clavicle approximately 1-2 fingerbreadths. A transverse incision was created and an inferior pocket was created with electrocautery and blunt dissection.  The port was then brought onto the field, placed into the pocket and secured to the chest wall with 2 Prolene sutures. The catheter was connected to the port and tunneled from the subclavicular incision to the access site. Fluoroscopic guidance was then used to cut the catheter to an appropriate length. The catheter was then placed through the peel-away sheath and the peel-away sheath was removed. The catheter tip was parked in excellent location under fluorocoscopic guidance in the SVC just above the cavoatrial junction. The pocket was then irrigated with antibiotic impregnated saline and the wound was closed with a running 3-0 Vicryl and a 4-0 Monocryl. The access incision was closed with a single 4-0 Monocryl. The Huber needle was used to withdraw blood and flush the port with heparinized saline. Dermabond was then placed as a dressing. The patient tolerated the procedure well and was taken to the recovery room in stable condition.   DEW,JASON 09/24/2015 12:48 PM

## 2015-09-24 NOTE — H&P (Signed)
[facility] Admission History & Physical  MRN : [id number]  Carl Bell is a 65 y.o. (Sep 17, 1950) male who presents with chief complaint of No chief complaint on file. Marland Kitchen  History of Present Illness: Patient is a 65 year old male with recently diagnosed lung cancer. He has a long history of tobacco use. This was recently biopsied and he needs to start chemotherapy and we have been asked to place a Port-A-Cath. He has no other complaints today.  No current facility-administered medications for this encounter.    Past Medical History  Diagnosis Date  . COPD (chronic obstructive pulmonary disease) ([place])   . Glaucoma   . Hypertension     No past surgical history on file.  Social History Social History  Substance Use Topics  . Smoking status: Current Every Day Smoker -- 1.00 packs/day for 35 years    Types: Cigarettes  . Smokeless tobacco: Never Used  . Alcohol Use: 0.0 oz/week    0 Standard drinks or equivalent per week     Comment: OCCASIONAL   no IV drug use  Family History No history of bleeding disorders, clotting disorders, autoimmune diseases, or aneurysms  No Known Allergies   REVIEW OF SYSTEMS (Negative unless checked)  Constitutional: '[]'$ Weight loss  '[]'$ Fever  '[]'$ Chills Cardiac: '[]'$ Chest pain   '[]'$ Chest pressure   '[]'$ Palpitations   '[]'$ Shortness of breath when laying flat   '[]'$ Shortness of breath at rest   '[]'$ Shortness of breath with exertion. Vascular:  '[]'$ Pain in legs with walking   '[]'$ Pain in legs at rest   '[]'$ Pain in legs when laying flat   '[]'$ Claudication   '[]'$ Pain in feet when walking  '[]'$ Pain in feet at rest  '[]'$ Pain in feet when laying flat   '[]'$ History of DVT   '[]'$ Phlebitis   '[]'$ Swelling in legs   '[]'$ Varicose veins   '[]'$ Non-healing ulcers Pulmonary:   '[]'$ Uses home oxygen   '[x]'$ Productive cough   '[]'$ Hemoptysis   '[]'$ Wheeze  '[]'$ COPD   '[]'$ Asthma Neurologic:  '[]'$ Dizziness  '[]'$ Blackouts   '[]'$ Seizures   '[]'$ History of stroke   '[]'$ History of TIA  '[]'$ Aphasia    '[]'$ Temporary blindness   '[]'$ Dysphagia   '[]'$ Weakness or numbness in arms   '[]'$ Weakness or numbness in legs Musculoskeletal:  '[]'$ Arthritis   '[]'$ Joint swelling   '[]'$ Joint pain   '[]'$ Low back pain Hematologic:  '[]'$ Easy bruising  '[]'$ Easy bleeding   '[]'$ Hypercoagulable state   '[]'$ Anemic  '[]'$ Hepatitis Gastrointestinal:  '[]'$ Blood in stool   '[]'$ Vomiting blood  '[]'$ Gastroesophageal reflux/heartburn   '[]'$ Difficulty swallowing. Genitourinary:  '[]'$ Chronic kidney disease   '[]'$ Difficult urination  '[]'$ Frequent urination  '[]'$ Burning with urination   '[]'$ Blood in urine Skin:  '[]'$ Rashes   '[]'$ Ulcers   '[]'$ Wounds Psychological:  '[]'$ History of anxiety   '[]'$  History of major depression.  Physical Examination  There were no vitals filed for this visit. There is no weight on file to calculate BMI. Gen: WD/WN, NAD Head: [place]/AT, No temporalis wasting. Prominent temp pulse not noted. Ear/Nose/Throat: Hearing grossly intact, nares w/o erythema or drainage, oropharynx w/o Erythema/Exudate,  Eyes: PERRLA, EOMI.  Neck: Supple, no nuchal rigidity.  No JVD.  Pulmonary:  Good air movement, no use of accessory muscles.  Cardiac: RRR, normal S1, S2, Vascular:  Vessel Right Left  Radial Palpable Palpable                                   Gastrointestinal: soft, non-tender/non-distended. No guarding/reflex.  Musculoskeletal: M/S 5/5 throughout.  Extremities without ischemic changes.  No deformity or atrophy.  Neurologic: CN 2-12 intact. Pain and light touch intact in extremities.  Symmetrical.  Speech is fluent. Motor exam as listed above. Psychiatric: Judgment intact, Mood & affect appropriate for pt's clinical situation. Dermatologic: No rashes or ulcers noted.  No cellulitis or open wounds. Lymph : No Cervical, Axillary, or Inguinal lymphadenopathy.     CBC Lab Results  Component Value Date   WBC 6.0 09/06/2015   HGB 12.1* 09/06/2015   HCT 36.3* 09/06/2015   MCV 83.6 09/06/2015   PLT 206 09/06/2015    BMET    Component Value  Date/Time   NA 137 08/27/2015 1617   NA 139 06/22/2014 1225   K 4.2 08/27/2015 1617   K 3.7 06/22/2014 1225   CL 103 08/27/2015 1617   CL 108* 06/22/2014 1225   CO2 27 08/27/2015 1617   CO2 27 06/22/2014 1225   GLUCOSE 99 08/27/2015 1617   GLUCOSE 126* 06/22/2014 1225   BUN 13 08/27/2015 1617   BUN 12 06/22/2014 1225   CREATININE 0.86 08/27/2015 1617   CREATININE 1.06 06/22/2014 1225   CALCIUM 9.1 08/27/2015 1617   CALCIUM 8.4* 06/22/2014 1225   GFRNONAA >60 08/27/2015 1617   GFRNONAA >60 06/22/2014 1225   GFRNONAA >60 10/11/2013 0928   GFRAA >60 08/27/2015 1617   GFRAA >60 06/22/2014 1225   GFRAA >60 10/11/2013 0928   CrCl cannot be calculated (Patient has no serum creatinine result on file.).  COAG Lab Results  Component Value Date   INR 1.13 09/06/2015   INR 1.0 10/11/2013    Radiology Dg Chest 1 View  09/06/2015  CLINICAL DATA:  Status post lingula mass biopsy EXAM: CHEST 1 VIEW COMPARISON:  09/06/2015 FINDINGS: The lingula mass is again noted. Background COPD/emphysema evident with hyperinflation. No focal pneumonia, collapse or consolidation. No edema, effusion or pneumothorax. Normal heart size and vascularity. Trachea midline. Monitor leads overlie the chest. No complicating feature. IMPRESSION: Stable lingula mass.  No effusion or pneumothorax following biopsy. Electronically Signed   By: Jerilynn Mages.  Shick M.D.   On: 09/06/2015 11:13   Dg Chest 2 View  08/27/2015  CLINICAL DATA:  Cough and congestion for 4 days with history of COPD, patient smokes EXAM: CHEST  2 VIEW COMPARISON:  04/29/2015 FINDINGS: The heart size and vascular pattern are normal. Hyperinflation indicate COPD. There is a new rounded opacity in the lingula which is concerning for a mass. It measures about 3 cm in diameter. No pleural effusion. IMPRESSION: CT of the thorax preferably with contrast recommended to evaluate abnormal opacity in the lingula bulges concerning for neoplasm. These results will be called  to the ordering clinician or representative by the Radiologist Assistant, and communication documented in the PACS or zVision Dashboard. Electronically Signed   By: Skipper Cliche M.D.   On: 08/27/2015 16:45   Ct Chest W Contrast  08/27/2015  CLINICAL DATA:  65 year old male with cough and sneezing for the past 3 days. Possible pulmonary nodule on chest x-ray from earlier today. EXAM: CT CHEST WITH CONTRAST TECHNIQUE: Multidetector CT imaging of the chest was performed during intravenous contrast administration. CONTRAST:  60m OMNIPAQUE IOHEXOL 300 MG/ML  SOLN COMPARISON:  Prior chest x-ray 08/27/2015 FINDINGS: Mediastinum: Unremarkable CT appearance of the thyroid gland. No suspicious mediastinal or hilar adenopathy. No soft tissue mediastinal mass. The thoracic esophagus is unremarkable. Heart/Vascular: Atherosclerotic calcifications along the transverse aorta and coronary arteries. The heart is normal  in size. No aortic aneurysm or dissection. No pericardial effusion. Lungs/Pleura: Macro lobulated mass centered in the lingula with surrounding spiculation measures 3.6 x 2.6 x 4.2 cm. This accounts for the abnormality on the chest x-ray. Background of paraseptal and centrilobular pulmonary emphysema. Nonspecific 4 mm nodule in the posterior aspect of the left upper lobe (image 13 series 3). Bones/Soft Tissues: No acute fracture or aggressive appearing lytic or blastic osseous lesion. Upper Abdomen: Incompletely imaged renal lesions bilaterally. On the left, there is an approximately 3.3 cm centrally low-attenuation lesion with some rim calcification consistent with an at least mildly complex cyst. On the right, there is a somewhat max her lobulated 3.3 cm lesion which is incompletely imaged but appears low attenuation. IMPRESSION: 1. 4.2 cm macro lobulated lingular mass with surrounding spiculation consistent with a primary bronchogenic carcinoma. No definitive mediastinal or distant metastatic disease by CT  scan. Recommend further evaluation with PET-CT. 2. Incompletely imaged low-attenuation lesions in the bilateral kidneys which are favored to represent at least mildly complex renal cysts. Recommend further evaluation of the kidneys with non emergent renal protocol CT scan of the abdomen (with and without contrast). 3. Atherosclerosis including coronary artery disease. 4. Nonspecific 4 mm nodule in the posterior aspect of the left upper lobe warrants attention on routine follow-up imaging. 5. Pulmonary emphysema. Electronically Signed   By: Jacqulynn Cadet M.D.   On: 08/27/2015 18:26   Nm Pet Image Initial (pi) Skull Base To Thigh  09/13/2015  CLINICAL DATA:  Initial treatment strategy for lingular squamous cell carcinoma. EXAM: NUCLEAR MEDICINE PET SKULL BASE TO THIGH TECHNIQUE: 12.0 mCi F-18 FDG was injected intravenously. Full-ring PET imaging was performed from the skull base to thigh after the radiotracer. CT data was obtained and used for attenuation correction and anatomic localization. FASTING BLOOD GLUCOSE:  Value: 59 mg/dl COMPARISON:  Chest CT on 06/25/2016 FINDINGS: NECK No hypermetabolic lymph nodes in the neck. CHEST A spiculated mass within the inferior aspect of the lingula currently measures 2.8 x 3.8 cm on image 119/ series 3 compared to 2.6 x 3.6 cm previously. This has an SUV max of 20.6. 4 mm pulmonary nodule in the posterior left upper lobe on image 65/series 3 is stable. This shows no metabolic activity but is too small to characterize by PET. Mild emphysema again noted. No evidence of pleural effusion. A 10 mm subcarinal mediastinal lymph node shows mild hypermetabolic activity with SUV max of 3.2. No hypermetabolic hilar lymph nodes identified. ABDOMEN/PELVIS No abnormal hypermetabolic activity within the liver, pancreas, adrenal glands, or spleen. Several small cystic lesions are seen in both kidneys some of which contain peripheral calcification. None of these lesions shows  hypermetabolic activity, and these are most consistent with benign complex renal cysts. No hypermetabolic lymph nodes in the abdomen or pelvis. Mild ascites noted within the pelvis. A large right inguinal hernia is seen containing small bowel and colon. No evidence of bowel ischemia or obstruction. SKELETON No focal hypermetabolic activity to suggest skeletal metastasis. IMPRESSION: 3.8 cm hypermetabolic lingular mass, consistent with primary bronchogenic carcinoma. 10 mm subcarinal mediastinal lymph node with mild hypermetabolic activity. Mediastinal lymph node metastasis cannot be excluded. No evidence of metastatic disease within the neck, abdomen, or pelvis. 4 mm posterior left upper lobe pulmonary nodule and complex bilateral renal cysts, which show no metabolic activity and are likely benign. Continued attention recommended on follow-up imaging. Large right inguinal hernia and mild ascites. Electronically Signed   By: Sharrie Rothman.D.  On: 09/13/2015 10:37   Ct Biopsy  09/06/2015  INDICATION: 3.5 cm lingula mass concerning for bronchogenic lung carcinoma EXAM: CT-GUIDED BIOPSY LINGULA MASS MEDICATIONS: 1% LIDOCAINE LOCALLY ANESTHESIA/SEDATION: 2 mg IV Versed; 75 mcg IV Fentanyl Moderate Sedation Time:  20 The patient was continuously monitored during the procedure by the interventional radiology nurse under my direct supervision. PROCEDURE: The procedure, risks, benefits, and alternatives were explained to the patient. Questions regarding the procedure were encouraged and answered. The patient understands and consents to the procedure. The LEFT ANTERIOR CHEST was prepped with chlorhexidine in a sterile fashion, and a sterile drape was applied covering the operative field. A sterile gown and sterile gloves were used for the procedure. Previous imaging reviewed. Patient positioned supine. Noncontrast localization CT performed. The lingula mass was localized. Under sterile conditions and CT guidance, a(n) 17  gauge 6.8 cm guide needle was advanced into the lingula mass from a anterior oblique approach. 3 18 gauge core biopsies obtained and placed in formalin. The guide needle was removed with insertion of the Biosentry device for pneumothorax prevention. Final imaging was performed. Patient tolerated the procedure well without complication. Vital sign monitoring by nursing staff during the procedure will continue as patient is in the special procedures unit for post procedure observation. FINDINGS: The images document guide needle placement within the lingula mass. Post biopsy images demonstrate no hemorrhage, effusion or pneumothorax. COMPLICATIONS: None immediate. IMPRESSION: Successful CT-guided core biopsy of the lingula mass Electronically Signed   By: Jerilynn Mages.  Shick M.D.   On: 09/06/2015 09:25      Assessment/Plan 1.  Lung cancer. Needs Port-A-Cath for chemotherapy. Risks and benefits discussed and we'll place today. 2. Tobacco dependence. Cessation recommended. 3. Hypertension. Stable.  4. Glaucoma. Stable.   Kalliopi Coupland, MD  09/24/2015 10:23 AM

## 2015-09-25 ENCOUNTER — Encounter: Payer: Self-pay | Admitting: Vascular Surgery

## 2015-09-25 DIAGNOSIS — C3412 Malignant neoplasm of upper lobe, left bronchus or lung: Secondary | ICD-10-CM | POA: Diagnosis not present

## 2015-10-01 ENCOUNTER — Inpatient Hospital Stay: Payer: Medicare Other

## 2015-10-01 ENCOUNTER — Ambulatory Visit
Admission: RE | Admit: 2015-10-01 | Discharge: 2015-10-01 | Disposition: A | Discharge status: Home / Self Care | Payer: Medicare Other | Source: Ambulatory Visit | Attending: Radiation Oncology | Admitting: Radiation Oncology

## 2015-10-01 DIAGNOSIS — C3412 Malignant neoplasm of upper lobe, left bronchus or lung: Secondary | ICD-10-CM | POA: Diagnosis not present

## 2015-10-02 ENCOUNTER — Ambulatory Visit
Admission: RE | Admit: 2015-10-02 | Discharge: 2015-10-02 | Disposition: A | Discharge status: Home / Self Care | Payer: Medicare Other | Source: Ambulatory Visit | Attending: Radiation Oncology | Admitting: Radiation Oncology

## 2015-10-02 ENCOUNTER — Inpatient Hospital Stay: Payer: Medicare Other

## 2015-10-02 DIAGNOSIS — C3412 Malignant neoplasm of upper lobe, left bronchus or lung: Secondary | ICD-10-CM | POA: Diagnosis not present

## 2015-10-03 ENCOUNTER — Inpatient Hospital Stay: Payer: Medicare Other

## 2015-10-03 ENCOUNTER — Ambulatory Visit
Admission: RE | Admit: 2015-10-03 | Discharge: 2015-10-03 | Disposition: A | Discharge status: Home / Self Care | Payer: Medicare Other | Source: Ambulatory Visit | Attending: Radiation Oncology | Admitting: Radiation Oncology

## 2015-10-03 DIAGNOSIS — C3412 Malignant neoplasm of upper lobe, left bronchus or lung: Secondary | ICD-10-CM | POA: Diagnosis not present

## 2015-10-04 ENCOUNTER — Ambulatory Visit
Admission: RE | Admit: 2015-10-04 | Discharge: 2015-10-04 | Disposition: A | Discharge status: Home / Self Care | Payer: Medicare Other | Source: Ambulatory Visit

## 2015-10-04 ENCOUNTER — Other Ambulatory Visit: Payer: Self-pay | Admitting: Oncology

## 2015-10-04 ENCOUNTER — Ambulatory Visit
Admission: RE | Admit: 2015-10-04 | Discharge: 2015-10-04 | Disposition: A | Discharge status: Home / Self Care | Payer: Medicare Other | Source: Ambulatory Visit | Attending: Oncology | Admitting: Oncology

## 2015-10-04 ENCOUNTER — Inpatient Hospital Stay: Payer: Medicare Other

## 2015-10-04 ENCOUNTER — Inpatient Hospital Stay (HOSPITAL_BASED_OUTPATIENT_CLINIC_OR_DEPARTMENT_OTHER): Payer: Medicare Other | Admitting: Oncology

## 2015-10-04 VITALS — BP 172/114 | HR 96 | Temp 98.4°F | Resp 16 | Wt 146.8 lb

## 2015-10-04 DIAGNOSIS — I739 Peripheral vascular disease, unspecified: Secondary | ICD-10-CM | POA: Insufficient documentation

## 2015-10-04 DIAGNOSIS — F17210 Nicotine dependence, cigarettes, uncomplicated: Secondary | ICD-10-CM

## 2015-10-04 DIAGNOSIS — C3490 Malignant neoplasm of unspecified part of unspecified bronchus or lung: Secondary | ICD-10-CM | POA: Diagnosis present

## 2015-10-04 DIAGNOSIS — C3412 Malignant neoplasm of upper lobe, left bronchus or lung: Secondary | ICD-10-CM | POA: Diagnosis not present

## 2015-10-04 DIAGNOSIS — C3432 Malignant neoplasm of lower lobe, left bronchus or lung: Secondary | ICD-10-CM

## 2015-10-04 DIAGNOSIS — Z79899 Other long term (current) drug therapy: Secondary | ICD-10-CM

## 2015-10-04 DIAGNOSIS — G319 Degenerative disease of nervous system, unspecified: Secondary | ICD-10-CM | POA: Insufficient documentation

## 2015-10-04 MED ORDER — GADOBENATE DIMEGLUMINE 529 MG/ML IV SOLN
15.0000 mL | Freq: Once | INTRAVENOUS | Status: AC | PRN
  Administered 2015-10-04: 13 mL via INTRAVENOUS

## 2015-10-04 MED ORDER — LIDOCAINE-PRILOCAINE 2.5-2.5 % EX CREA: 1.0000 "application " | TOPICAL_CREAM | CUTANEOUS | Status: DC | PRN

## 2015-10-04 MED ORDER — PROCHLORPERAZINE MALEATE 10 MG PO TABS: 10.0000 mg | ORAL_TABLET | Freq: Four times a day (QID) | ORAL | Status: DC | PRN

## 2015-10-04 NOTE — Progress Notes (Signed)
Here to discuss test results and does not offer any problems today.  Blood pressure is elevated today at 172/114 but reports he has not taken his bp medication in the last "week or so" and denies any symptoms of headache, dizziness, or nausea.

## 2015-10-05 ENCOUNTER — Inpatient Hospital Stay: Payer: Medicare Other

## 2015-10-05 ENCOUNTER — Ambulatory Visit
Admission: RE | Admit: 2015-10-05 | Discharge: 2015-10-05 | Disposition: A | Discharge status: Home / Self Care | Payer: Medicare Other | Source: Ambulatory Visit | Attending: Radiation Oncology | Admitting: Radiation Oncology

## 2015-10-05 DIAGNOSIS — C3412 Malignant neoplasm of upper lobe, left bronchus or lung: Secondary | ICD-10-CM | POA: Diagnosis not present

## 2015-10-05 NOTE — Patient Instructions (Signed)

## 2015-10-08 ENCOUNTER — Inpatient Hospital Stay: Payer: Medicare Other

## 2015-10-08 ENCOUNTER — Ambulatory Visit
Admission: RE | Admit: 2015-10-08 | Discharge: 2015-10-08 | Disposition: A | Discharge status: Home / Self Care | Payer: Medicare Other | Source: Ambulatory Visit | Attending: Radiation Oncology | Admitting: Radiation Oncology

## 2015-10-08 DIAGNOSIS — C3412 Malignant neoplasm of upper lobe, left bronchus or lung: Secondary | ICD-10-CM | POA: Diagnosis not present

## 2015-10-09 ENCOUNTER — Inpatient Hospital Stay: Payer: Medicare Other

## 2015-10-09 ENCOUNTER — Ambulatory Visit
Admission: RE | Admit: 2015-10-09 | Discharge: 2015-10-09 | Disposition: A | Discharge status: Home / Self Care | Payer: Medicare Other | Source: Ambulatory Visit | Attending: Radiation Oncology | Admitting: Radiation Oncology

## 2015-10-09 DIAGNOSIS — C3412 Malignant neoplasm of upper lobe, left bronchus or lung: Secondary | ICD-10-CM | POA: Diagnosis not present

## 2015-10-10 ENCOUNTER — Inpatient Hospital Stay: Payer: Medicare Other

## 2015-10-10 ENCOUNTER — Ambulatory Visit
Admission: RE | Admit: 2015-10-10 | Discharge: 2015-10-10 | Disposition: A | Discharge status: Home / Self Care | Payer: Medicare Other | Source: Ambulatory Visit | Attending: Radiation Oncology | Admitting: Radiation Oncology

## 2015-10-10 DIAGNOSIS — C3412 Malignant neoplasm of upper lobe, left bronchus or lung: Secondary | ICD-10-CM | POA: Diagnosis not present

## 2015-10-11 ENCOUNTER — Inpatient Hospital Stay: Payer: Medicare Other

## 2015-10-11 ENCOUNTER — Inpatient Hospital Stay: Payer: Medicare Other | Admitting: Oncology

## 2015-10-11 ENCOUNTER — Ambulatory Visit
Admission: RE | Admit: 2015-10-11 | Discharge: 2015-10-11 | Disposition: A | Discharge status: Home / Self Care | Payer: Medicare Other | Source: Ambulatory Visit | Attending: Radiation Oncology | Admitting: Radiation Oncology

## 2015-10-11 ENCOUNTER — Ambulatory Visit: Payer: Medicare Other

## 2015-10-12 ENCOUNTER — Ambulatory Visit
Admission: RE | Admit: 2015-10-12 | Discharge: 2015-10-12 | Disposition: A | Discharge status: Home / Self Care | Payer: Medicare Other | Source: Ambulatory Visit | Attending: Radiation Oncology | Admitting: Radiation Oncology

## 2015-10-12 ENCOUNTER — Inpatient Hospital Stay: Payer: Medicare Other

## 2015-10-12 DIAGNOSIS — C3412 Malignant neoplasm of upper lobe, left bronchus or lung: Secondary | ICD-10-CM | POA: Diagnosis not present

## 2015-10-15 ENCOUNTER — Ambulatory Visit
Admission: RE | Admit: 2015-10-15 | Discharge: 2015-10-15 | Disposition: A | Discharge status: Home / Self Care | Payer: Medicare Other | Source: Ambulatory Visit | Attending: Radiation Oncology | Admitting: Radiation Oncology

## 2015-10-15 ENCOUNTER — Inpatient Hospital Stay: Payer: Medicare Other | Attending: Oncology

## 2015-10-15 DIAGNOSIS — F17210 Nicotine dependence, cigarettes, uncomplicated: Secondary | ICD-10-CM | POA: Insufficient documentation

## 2015-10-15 DIAGNOSIS — Z5111 Encounter for antineoplastic chemotherapy: Secondary | ICD-10-CM | POA: Insufficient documentation

## 2015-10-15 DIAGNOSIS — D61818 Other pancytopenia: Secondary | ICD-10-CM | POA: Insufficient documentation

## 2015-10-15 DIAGNOSIS — J449 Chronic obstructive pulmonary disease, unspecified: Secondary | ICD-10-CM | POA: Insufficient documentation

## 2015-10-15 DIAGNOSIS — F419 Anxiety disorder, unspecified: Secondary | ICD-10-CM | POA: Insufficient documentation

## 2015-10-15 DIAGNOSIS — C3412 Malignant neoplasm of upper lobe, left bronchus or lung: Secondary | ICD-10-CM | POA: Insufficient documentation

## 2015-10-15 DIAGNOSIS — T451X5S Adverse effect of antineoplastic and immunosuppressive drugs, sequela: Secondary | ICD-10-CM | POA: Insufficient documentation

## 2015-10-15 DIAGNOSIS — D6481 Anemia due to antineoplastic chemotherapy: Secondary | ICD-10-CM | POA: Insufficient documentation

## 2015-10-15 DIAGNOSIS — R634 Abnormal weight loss: Secondary | ICD-10-CM | POA: Insufficient documentation

## 2015-10-15 DIAGNOSIS — Z79899 Other long term (current) drug therapy: Secondary | ICD-10-CM | POA: Insufficient documentation

## 2015-10-15 DIAGNOSIS — I10 Essential (primary) hypertension: Secondary | ICD-10-CM | POA: Insufficient documentation

## 2015-10-15 DIAGNOSIS — R1310 Dysphagia, unspecified: Secondary | ICD-10-CM | POA: Insufficient documentation

## 2015-10-16 ENCOUNTER — Other Ambulatory Visit: Payer: Self-pay | Admitting: *Deleted

## 2015-10-16 ENCOUNTER — Ambulatory Visit
Admission: RE | Admit: 2015-10-16 | Discharge: 2015-10-16 | Disposition: A | Discharge status: Home / Self Care | Payer: Medicare Other | Source: Ambulatory Visit | Attending: Radiation Oncology | Admitting: Radiation Oncology

## 2015-10-16 ENCOUNTER — Inpatient Hospital Stay: Payer: Medicare Other

## 2015-10-16 DIAGNOSIS — C3412 Malignant neoplasm of upper lobe, left bronchus or lung: Secondary | ICD-10-CM

## 2015-10-16 MED ORDER — SILDENAFIL CITRATE 20 MG PO TABS: 20.0000 mg | ORAL_TABLET | Freq: Every day | ORAL | Status: DC

## 2015-10-16 MED ORDER — SILDENAFIL CITRATE 25 MG PO TABS: 25.0000 mg | ORAL_TABLET | Freq: Every day | ORAL | Status: DC | PRN

## 2015-10-17 ENCOUNTER — Ambulatory Visit
Admission: RE | Admit: 2015-10-17 | Discharge: 2015-10-17 | Disposition: A | Discharge status: Home / Self Care | Payer: Medicare Other | Source: Ambulatory Visit | Attending: Radiation Oncology | Admitting: Radiation Oncology

## 2015-10-17 ENCOUNTER — Inpatient Hospital Stay: Payer: Medicare Other

## 2015-10-17 DIAGNOSIS — C3412 Malignant neoplasm of upper lobe, left bronchus or lung: Secondary | ICD-10-CM | POA: Diagnosis not present

## 2015-10-17 NOTE — Progress Notes (Signed)
[facility]  [phone] Fax:([phone]) [phone]  ID: Carl Bell OB: 07-Apr-1951  MR#: [id number]  XVQ#:[id number]  Patient Care Team: Kirk Ruths, MD as PCP - General (Internal Medicine)  CHIEF COMPLAINT:  Chief Complaint  Patient presents with  . Lung Cancer    INTERVAL HISTORY: Patient returns to clinic today for further evaluation and treatment planning. He recently initiated XRT and is tolerating it well. He continues to feel well and asymptomatic. He has no neurologic complaints. He denies any recent fevers. He has a good appetite and denies weight loss. He continues to have a cough, but denies chest pain or shortness of breath. He denies any hemoptysis. He denies any nausea, vomiting, constipation, or diarrhea. He has no urinary complaints. Patient offers no further specific complaints  REVIEW OF SYSTEMS:   Review of Systems  Constitutional: Negative for fever, weight loss and malaise/fatigue.  Respiratory: Positive for cough. Negative for hemoptysis and shortness of breath.   Cardiovascular: Negative.  Negative for chest pain.  Gastrointestinal: Negative.   Musculoskeletal: Negative.   Neurological: Negative.  Negative for weakness.  Psychiatric/Behavioral: The patient is nervous/anxious.     As per HPI. Otherwise, a complete review of systems is negatve.  PAST MEDICAL HISTORY: Past Medical History  Diagnosis Date  . COPD (chronic obstructive pulmonary disease) ([place])   . Glaucoma   . Hypertension     PAST SURGICAL HISTORY: Past Surgical History  Procedure Laterality Date  . Peripheral vascular catheterization N/A 09/24/2015    Procedure: [name];  Surgeon: Algernon Huxley, MD;  Location: [facility];  Service: Cardiovascular;  Laterality: N/A;    FAMILY HISTORY: No reported history of malignancy or chronic disease.     ADVANCED DIRECTIVES:    HEALTH MAINTENANCE: Social History  Substance Use Topics  .  Smoking status: Current Every Day Smoker -- 0.50 packs/day for 50 years    Types: Cigarettes  . Smokeless tobacco: Never Used  . Alcohol Use: 0.0 oz/week    0 Standard drinks or equivalent per week     Comment: 3 pints/week     Colonoscopy:  PAP:  Bone density:  Lipid panel:  No Known Allergies  Current Outpatient Prescriptions  Medication Sig Dispense Refill  . albuterol (PROVENTIL HFA;VENTOLIN HFA) 108 (90 BASE) MCG/ACT inhaler Inhale 2 puffs into the lungs every 6 (six) hours as needed for wheezing or shortness of breath. 1 Inhaler 0  . brimonidine (ALPHAGAN) 0.2 % ophthalmic solution Place 1 drop into both eyes 2 (two) times daily.   4  . fluticasone (FLONASE) 50 MCG/ACT nasal spray Place 1 spray into both nostrils daily.    . ranitidine (ZANTAC) 75 MG tablet Take 75 mg by mouth daily as needed for heartburn.    . timolol (BETIMOL) 0.5 % ophthalmic solution Place 1 drop into both eyes daily.    . timolol (TIMOPTIC) 0.5 % ophthalmic solution   4  . amLODipine (NORVASC) 5 MG tablet Take 1 tablet (5 mg total) by mouth daily. (Patient not taking: Reported on 10/04/2015) 30 tablet 1  . lidocaine-prilocaine (EMLA) cream Apply 1 application topically as needed. Apply to port 1 hour prior to chemotherapy appointment. Cover with plastic wrap. 30 g 2  . prochlorperazine (COMPAZINE) 10 MG tablet Take 1 tablet (10 mg total) by mouth every 6 (six) hours as needed for nausea or vomiting. 30 tablet 0  . sildenafil (REVATIO) 20 MG tablet Take 1 tablet (20 mg total) by  mouth daily. 5 tablet 0   No current facility-administered medications for this visit.    OBJECTIVE: Filed Vitals:   November 03, 2015 1043  BP: 172/114  Pulse: 96  Temp: 98.4 F (36.9 C)  Resp: 16     Body mass index is 19.38 kg/(m^2).    ECOG FS:0 - Asymptomatic  General: Well-developed, well-nourished, no acute distress. Eyes: Pink conjunctiva, anicteric sclera. Lungs: Clear to auscultation bilaterally. Heart: Regular rate and  rhythm. No rubs, murmurs, or gallops. Abdomen: Soft, nontender, nondistended. No organomegaly noted, normoactive bowel sounds. Musculoskeletal: No edema, cyanosis, or clubbing. Neuro: Alert, answering all questions appropriately. Cranial nerves grossly intact. Skin: No rashes or petechiae noted. Psych: Normal affect.   LAB RESULTS:  Lab Results  Component Value Date   NA 137 08/27/2015   K 4.2 08/27/2015   CL 103 08/27/2015   CO2 27 08/27/2015   GLUCOSE 99 08/27/2015   BUN 13 08/27/2015   CREATININE 0.86 08/27/2015   CALCIUM 9.1 08/27/2015   PROT 6.9 08/27/2015   ALBUMIN 3.6 08/27/2015   AST 22 08/27/2015   ALT 13* 08/27/2015   ALKPHOS 65 08/27/2015   BILITOT 1.0 08/27/2015   GFRNONAA >60 08/27/2015   GFRAA >60 08/27/2015    Lab Results  Component Value Date   WBC 6.0 09/06/2015   HGB 12.1* 09/06/2015   HCT 36.3* 09/06/2015   MCV 83.6 09/06/2015   PLT 206 09/06/2015     STUDIES: Mr Jeri Cos WR Contrast  11-03-2015  CLINICAL DATA:  Lung cancer, staging. No reported neurologic complaints. EXAM: MRI HEAD WITHOUT AND WITH CONTRAST TECHNIQUE: Multiplanar, multiecho pulse sequences of the brain and surrounding structures were obtained without and with intravenous contrast. CONTRAST:  71m MULTIHANCE GADOBENATE DIMEGLUMINE 529 MG/ML IV SOLN COMPARISON:  PET scan 09/13/2015. FINDINGS: No evidence for acute infarction, hemorrhage, mass lesion, hydrocephalus, or extra-axial fluid. Mild cerebral and cerebellar atrophy. Mild subcortical and periventricular T2 and FLAIR hyperintensities, likely chronic microvascular ischemic change. Flow voids are maintained in the carotid, basilar, and vertebral arteries. LEFT vertebral dominant. Tiny focus susceptibility in the LEFT parietal white matter, nonspecific but likely sequelae of hypertensive vascular disease. Post infusion, no abnormal enhancement of the brain or meninges. Major dural venous sinuses are patent. Mild paranasal sinus disease.  No mastoid fluid. No osseous lesion. Encapsulated 8 x 11 x 27 mm retro mastoid subcutaneous soft tissue abnormality, favored to represent a benign soft tissue abnormalities such as sebaceous cyst. IMPRESSION: Mild atrophy and small vessel disease. No acute intracranial findings. No enhancing lesions to suggest metastatic disease. Electronically Signed   By: JStaci RighterM.D.   On: 02017/10/2207:00    ASSESSMENT: Stage IIIa squamous cell carcinoma of the lung.  PLAN:    1. Lung cancer: Biopsy and PET scan results reviewed independently. Brain MRI also reviewed independently with no evidence of metastatic disease.  Patient subcarinal lymph node is only mildly hypermetabolic, but cannot rule out metastasis. Patient has initiated his XRT and will initiate concurrent chemotherapy in the next week. Plan to give weekly carboplatinum and Taxol through the duration of XRT and then likely give 2 consolidation doses at its conclusion.   Approximately 30 minutes was spent in discussion of which greater than 50% was consultation.   Patient expressed understanding and was in agreement with this plan. He also understands that He can call clinic at any time with any questions, concerns, or complaints.    TLloyd Huger MD   10/17/2015 9:21 AM

## 2015-10-18 ENCOUNTER — Ambulatory Visit
Admission: RE | Admit: 2015-10-18 | Discharge: 2015-10-18 | Disposition: A | Discharge status: Home / Self Care | Payer: Medicare Other | Source: Ambulatory Visit | Attending: Radiation Oncology | Admitting: Radiation Oncology

## 2015-10-18 ENCOUNTER — Inpatient Hospital Stay: Payer: Medicare Other

## 2015-10-18 ENCOUNTER — Inpatient Hospital Stay (HOSPITAL_BASED_OUTPATIENT_CLINIC_OR_DEPARTMENT_OTHER): Payer: Medicare Other | Admitting: Oncology

## 2015-10-18 VITALS — BP 119/83 | HR 84 | Temp 98.2°F | Resp 16

## 2015-10-18 VITALS — BP 177/117 | HR 98 | Temp 99.1°F | Wt 145.9 lb

## 2015-10-18 DIAGNOSIS — R634 Abnormal weight loss: Secondary | ICD-10-CM

## 2015-10-18 DIAGNOSIS — D61818 Other pancytopenia: Secondary | ICD-10-CM | POA: Diagnosis not present

## 2015-10-18 DIAGNOSIS — C3412 Malignant neoplasm of upper lobe, left bronchus or lung: Secondary | ICD-10-CM

## 2015-10-18 DIAGNOSIS — F17210 Nicotine dependence, cigarettes, uncomplicated: Secondary | ICD-10-CM | POA: Diagnosis not present

## 2015-10-18 DIAGNOSIS — Z79899 Other long term (current) drug therapy: Secondary | ICD-10-CM

## 2015-10-18 DIAGNOSIS — T451X5S Adverse effect of antineoplastic and immunosuppressive drugs, sequela: Secondary | ICD-10-CM | POA: Diagnosis not present

## 2015-10-18 DIAGNOSIS — J449 Chronic obstructive pulmonary disease, unspecified: Secondary | ICD-10-CM | POA: Diagnosis not present

## 2015-10-18 DIAGNOSIS — C3432 Malignant neoplasm of lower lobe, left bronchus or lung: Secondary | ICD-10-CM

## 2015-10-18 DIAGNOSIS — R1310 Dysphagia, unspecified: Secondary | ICD-10-CM | POA: Diagnosis not present

## 2015-10-18 DIAGNOSIS — I10 Essential (primary) hypertension: Secondary | ICD-10-CM

## 2015-10-18 DIAGNOSIS — F419 Anxiety disorder, unspecified: Secondary | ICD-10-CM | POA: Diagnosis not present

## 2015-10-18 DIAGNOSIS — Z5111 Encounter for antineoplastic chemotherapy: Secondary | ICD-10-CM | POA: Diagnosis not present

## 2015-10-18 DIAGNOSIS — D6481 Anemia due to antineoplastic chemotherapy: Secondary | ICD-10-CM | POA: Diagnosis not present

## 2015-10-18 LAB — CBC WITH DIFFERENTIAL/PLATELET
BASOS PCT: 0 %
Basophils Absolute: 0 10*3/uL (ref 0–0.1)
EOS ABS: 0.1 10*3/uL (ref 0–0.7)
Eosinophils Relative: 4 %
HCT: 34.1 % — ABNORMAL LOW (ref 40.0–52.0)
HEMOGLOBIN: 11.5 g/dL — AB (ref 13.0–18.0)
LYMPHS ABS: 0.5 10*3/uL — AB (ref 1.0–3.6)
Lymphocytes Relative: 13 %
MCH: 28 pg (ref 26.0–34.0)
MCHC: 33.6 g/dL (ref 32.0–36.0)
MCV: 83.2 fL (ref 80.0–100.0)
MONO ABS: 0.6 10*3/uL (ref 0.2–1.0)
MONOS PCT: 14 %
NEUTROS PCT: 69 %
Neutro Abs: 2.8 10*3/uL (ref 1.4–6.5)
Platelets: 171 10*3/uL (ref 150–440)
RBC: 4.1 MIL/uL — ABNORMAL LOW (ref 4.40–5.90)
RDW: 15.7 % — AB (ref 11.5–14.5)
WBC: 4.1 10*3/uL (ref 3.8–10.6)

## 2015-10-18 LAB — COMPREHENSIVE METABOLIC PANEL
ALBUMIN: 3.7 g/dL (ref 3.5–5.0)
ALK PHOS: 54 U/L (ref 38–126)
ALT: 17 U/L (ref 17–63)
ANION GAP: 8 (ref 5–15)
AST: 53 U/L — ABNORMAL HIGH (ref 15–41)
BILIRUBIN TOTAL: 0.9 mg/dL (ref 0.3–1.2)
BUN: 13 mg/dL (ref 6–20)
CALCIUM: 8.6 mg/dL — AB (ref 8.9–10.3)
CO2: 24 mmol/L (ref 22–32)
Chloride: 105 mmol/L (ref 101–111)
Creatinine, Ser: 0.66 mg/dL (ref 0.61–1.24)
GFR calc non Af Amer: >60 mL/min (ref >60)
GLUCOSE: 88 mg/dL (ref 65–99)
Potassium: 3.9 mmol/L (ref 3.5–5.1)
SODIUM: 137 mmol/L (ref 135–145)
TOTAL PROTEIN: 6.9 g/dL (ref 6.5–8.1)

## 2015-10-18 MED ORDER — DIPHENHYDRAMINE HCL 50 MG/ML IJ SOLN
25.0000 mg | Freq: Once | INTRAMUSCULAR | Status: AC
  Administered 2015-10-18: 25 mg via INTRAVENOUS
  Filled 2015-10-18: qty 1

## 2015-10-18 MED ORDER — SODIUM CHLORIDE 0.9% FLUSH
10.0000 mL | INTRAVENOUS | Status: DC | PRN
  Administered 2015-10-18: 10 mL via INTRAVENOUS
  Filled 2015-10-18: qty 10

## 2015-10-18 MED ORDER — SODIUM CHLORIDE 0.9 % IV SOLN
10.0000 mg | Freq: Once | INTRAVENOUS | Status: AC
  Administered 2015-10-18: 10 mg via INTRAVENOUS
  Filled 2015-10-18: qty 1

## 2015-10-18 MED ORDER — PALONOSETRON HCL INJECTION 0.25 MG/5ML
0.2500 mg | Freq: Once | INTRAVENOUS | Status: AC
  Administered 2015-10-18: 0.25 mg via INTRAVENOUS
  Filled 2015-10-18: qty 5

## 2015-10-18 MED ORDER — MEGESTROL ACETATE 40 MG PO TABS: 40.0000 mg | ORAL_TABLET | Freq: Every day | ORAL | Status: DC

## 2015-10-18 MED ORDER — PACLITAXEL CHEMO INJECTION 300 MG/50ML
80.0000 mg/m2 | Freq: Once | INTRAVENOUS | Status: AC
  Administered 2015-10-18: 150 mg via INTRAVENOUS
  Filled 2015-10-18: qty 25

## 2015-10-18 MED ORDER — HEPARIN SOD (PORK) LOCK FLUSH 100 UNIT/ML IV SOLN
500.0000 [IU] | Freq: Once | INTRAVENOUS | Status: AC
  Administered 2015-10-18: 500 [IU] via INTRAVENOUS

## 2015-10-18 MED ORDER — SODIUM CHLORIDE 0.9 % IV SOLN
187.0000 mg | Freq: Once | INTRAVENOUS | Status: AC
  Administered 2015-10-18: 190 mg via INTRAVENOUS
  Filled 2015-10-18: qty 19

## 2015-10-18 MED ORDER — FAMOTIDINE IN NACL 20-0.9 MG/50ML-% IV SOLN
20.0000 mg | Freq: Once | INTRAVENOUS | Status: AC
  Administered 2015-10-18: 20 mg via INTRAVENOUS
  Filled 2015-10-18: qty 50

## 2015-10-18 MED ORDER — SODIUM CHLORIDE 0.9 % IV SOLN
Freq: Once | INTRAVENOUS | Status: AC
  Administered 2015-10-18: 12:00:00 via INTRAVENOUS
  Filled 2015-10-18: qty 1000

## 2015-10-18 NOTE — Progress Notes (Signed)
Patient is here for first treatment.  His blood pressure is elevated today at 177/117 and does not offer any symptoms does report taking his bp medication yesterday.  Does not have much of an appetite eating about 2 meals a day and is interested in discussing possible medication to increase appetite.

## 2015-10-19 ENCOUNTER — Ambulatory Visit
Admission: RE | Admit: 2015-10-19 | Discharge: 2015-10-19 | Disposition: A | Discharge status: Home / Self Care | Payer: Medicare Other | Source: Ambulatory Visit | Attending: Radiation Oncology | Admitting: Radiation Oncology

## 2015-10-19 ENCOUNTER — Inpatient Hospital Stay: Payer: Medicare Other

## 2015-10-19 DIAGNOSIS — C3412 Malignant neoplasm of upper lobe, left bronchus or lung: Secondary | ICD-10-CM | POA: Diagnosis not present

## 2015-10-22 ENCOUNTER — Inpatient Hospital Stay: Payer: Medicare Other

## 2015-10-22 ENCOUNTER — Ambulatory Visit
Admission: RE | Admit: 2015-10-22 | Discharge: 2015-10-22 | Disposition: A | Discharge status: Home / Self Care | Payer: Medicare Other | Source: Ambulatory Visit | Attending: Radiation Oncology | Admitting: Radiation Oncology

## 2015-10-22 DIAGNOSIS — C3412 Malignant neoplasm of upper lobe, left bronchus or lung: Secondary | ICD-10-CM | POA: Diagnosis not present

## 2015-10-23 ENCOUNTER — Ambulatory Visit
Admission: RE | Admit: 2015-10-23 | Discharge: 2015-10-23 | Disposition: A | Discharge status: Home / Self Care | Payer: Medicare Other | Source: Ambulatory Visit | Attending: Radiation Oncology | Admitting: Radiation Oncology

## 2015-10-23 ENCOUNTER — Inpatient Hospital Stay: Payer: Medicare Other

## 2015-10-23 DIAGNOSIS — C3412 Malignant neoplasm of upper lobe, left bronchus or lung: Secondary | ICD-10-CM | POA: Diagnosis not present

## 2015-10-24 ENCOUNTER — Ambulatory Visit
Admission: RE | Admit: 2015-10-24 | Discharge: 2015-10-24 | Disposition: A | Discharge status: Home / Self Care | Payer: Medicare Other | Source: Ambulatory Visit | Attending: Radiation Oncology | Admitting: Radiation Oncology

## 2015-10-24 ENCOUNTER — Inpatient Hospital Stay: Payer: Medicare Other

## 2015-10-24 DIAGNOSIS — C3412 Malignant neoplasm of upper lobe, left bronchus or lung: Secondary | ICD-10-CM | POA: Diagnosis not present

## 2015-10-25 ENCOUNTER — Ambulatory Visit
Admission: RE | Admit: 2015-10-25 | Discharge: 2015-10-25 | Disposition: A | Discharge status: Home / Self Care | Payer: Medicare Other | Source: Ambulatory Visit | Attending: Radiation Oncology | Admitting: Radiation Oncology

## 2015-10-25 ENCOUNTER — Inpatient Hospital Stay: Payer: Medicare Other

## 2015-10-25 VITALS — BP 144/88 | HR 82 | Temp 98.3°F | Resp 18

## 2015-10-25 DIAGNOSIS — C3432 Malignant neoplasm of lower lobe, left bronchus or lung: Secondary | ICD-10-CM

## 2015-10-25 DIAGNOSIS — Z5111 Encounter for antineoplastic chemotherapy: Secondary | ICD-10-CM | POA: Diagnosis not present

## 2015-10-25 DIAGNOSIS — C3412 Malignant neoplasm of upper lobe, left bronchus or lung: Secondary | ICD-10-CM | POA: Diagnosis not present

## 2015-10-25 LAB — CBC WITH DIFFERENTIAL/PLATELET
Basophils Absolute: 0 10*3/uL (ref 0–0.1)
Basophils Relative: 2 %
EOS ABS: 0.1 10*3/uL (ref 0–0.7)
EOS PCT: 5 %
HCT: 31.6 % — ABNORMAL LOW (ref 40.0–52.0)
Hemoglobin: 10.5 g/dL — ABNORMAL LOW (ref 13.0–18.0)
LYMPHS ABS: 0.5 10*3/uL — AB (ref 1.0–3.6)
LYMPHS PCT: 22 %
MCH: 27.6 pg (ref 26.0–34.0)
MCHC: 33.3 g/dL (ref 32.0–36.0)
MCV: 82.8 fL (ref 80.0–100.0)
MONO ABS: 0.1 10*3/uL — AB (ref 0.2–1.0)
Monocytes Relative: 6 %
Neutro Abs: 1.6 10*3/uL (ref 1.4–6.5)
Neutrophils Relative %: 65 %
PLATELETS: 133 10*3/uL — AB (ref 150–440)
RBC: 3.82 MIL/uL — ABNORMAL LOW (ref 4.40–5.90)
RDW: 15.4 % — AB (ref 11.5–14.5)
WBC: 2.5 10*3/uL — ABNORMAL LOW (ref 3.8–10.6)

## 2015-10-25 LAB — COMPREHENSIVE METABOLIC PANEL
ALT: 18 U/L (ref 17–63)
ANION GAP: 5 (ref 5–15)
AST: 29 U/L (ref 15–41)
Albumin: 3.7 g/dL (ref 3.5–5.0)
Alkaline Phosphatase: 46 U/L (ref 38–126)
BUN: 13 mg/dL (ref 6–20)
CHLORIDE: 105 mmol/L (ref 101–111)
CO2: 26 mmol/L (ref 22–32)
CREATININE: 0.68 mg/dL (ref 0.61–1.24)
Calcium: 8.7 mg/dL — ABNORMAL LOW (ref 8.9–10.3)
Glucose, Bld: 84 mg/dL (ref 65–99)
POTASSIUM: 4.2 mmol/L (ref 3.5–5.1)
Sodium: 136 mmol/L (ref 135–145)
Total Bilirubin: 0.8 mg/dL (ref 0.3–1.2)
Total Protein: 6.6 g/dL (ref 6.5–8.1)

## 2015-10-25 MED ORDER — PACLITAXEL CHEMO INJECTION 300 MG/50ML
80.0000 mg/m2 | Freq: Once | INTRAVENOUS | Status: AC
  Administered 2015-10-25: 150 mg via INTRAVENOUS
  Filled 2015-10-25: qty 25

## 2015-10-25 MED ORDER — PALONOSETRON HCL INJECTION 0.25 MG/5ML
0.2500 mg | Freq: Once | INTRAVENOUS | Status: AC
  Administered 2015-10-25: 0.25 mg via INTRAVENOUS
  Filled 2015-10-25: qty 5

## 2015-10-25 MED ORDER — SODIUM CHLORIDE 0.9 % IV SOLN
10.0000 mg | Freq: Once | INTRAVENOUS | Status: AC
  Administered 2015-10-25: 10 mg via INTRAVENOUS
  Filled 2015-10-25: qty 1

## 2015-10-25 MED ORDER — SODIUM CHLORIDE 0.9 % IV SOLN
Freq: Once | INTRAVENOUS | Status: AC
  Administered 2015-10-25: 13:00:00 via INTRAVENOUS
  Filled 2015-10-25: qty 1000

## 2015-10-25 MED ORDER — SODIUM CHLORIDE 0.9% FLUSH
10.0000 mL | INTRAVENOUS | Status: DC | PRN
  Administered 2015-10-25: 10 mL via INTRAVENOUS
  Filled 2015-10-25: qty 10

## 2015-10-25 MED ORDER — FAMOTIDINE IN NACL 20-0.9 MG/50ML-% IV SOLN
20.0000 mg | Freq: Once | INTRAVENOUS | Status: AC
  Administered 2015-10-25: 20 mg via INTRAVENOUS
  Filled 2015-10-25: qty 50

## 2015-10-25 MED ORDER — DIPHENHYDRAMINE HCL 50 MG/ML IJ SOLN
25.0000 mg | Freq: Once | INTRAMUSCULAR | Status: AC
  Administered 2015-10-25: 25 mg via INTRAVENOUS
  Filled 2015-10-25: qty 1

## 2015-10-25 MED ORDER — HEPARIN SOD (PORK) LOCK FLUSH 100 UNIT/ML IV SOLN
500.0000 [IU] | Freq: Once | INTRAVENOUS | Status: AC
Start: 2015-10-25 — End: 2015-10-25
  Administered 2015-10-25: 500 [IU] via INTRAVENOUS
  Filled 2015-10-25: qty 5

## 2015-10-25 MED ORDER — SODIUM CHLORIDE 0.9 % IV SOLN
187.0000 mg | Freq: Once | INTRAVENOUS | Status: AC
  Administered 2015-10-25: 190 mg via INTRAVENOUS
  Filled 2015-10-25: qty 19

## 2015-10-25 NOTE — Progress Notes (Signed)
[place]  [phone]) [phone] Fax:(336) [phone]  ID: Carl Bell OB: 10-19-1950  MR#: [id number]  OVF#:[id number]  Patient Care Team: Kirk Ruths, MD as PCP - General (Internal Medicine)  CHIEF COMPLAINT:  Chief Complaint  Patient presents with  . Lung Cancer    INTERVAL HISTORY: Patient returns to clinic today for further evaluation and to initiate cycle 1 of weekly carboplatinum and Taxol. He continues to tolerate his daily XRT. His only complaint today is of decreased appetite. He has no neurologic complaints. He denies any recent fevers. He continues to have a cough, but denies chest pain or shortness of breath. He denies any hemoptysis. He denies any nausea, vomiting, constipation, or diarrhea. He has no urinary complaints. Patient offers no further specific complaints  REVIEW OF SYSTEMS:   Review of Systems  Constitutional: Negative for fever, weight loss and malaise/fatigue.  Respiratory: Positive for cough. Negative for hemoptysis and shortness of breath.   Cardiovascular: Negative.  Negative for chest pain.  Gastrointestinal: Negative.   Musculoskeletal: Negative.   Neurological: Negative.  Negative for weakness.  Psychiatric/Behavioral: The patient is nervous/anxious.     As per HPI. Otherwise, a complete review of systems is negatve.  PAST MEDICAL HISTORY: Past Medical History  Diagnosis Date  . COPD (chronic obstructive pulmonary disease) ([place])   . Glaucoma   . Hypertension     PAST SURGICAL HISTORY: Past Surgical History  Procedure Laterality Date  . Peripheral vascular catheterization N/A 09/24/2015    Procedure: [name];  Surgeon: Algernon Huxley, MD;  Location: [facility];  Service: Cardiovascular;  Laterality: N/A;    FAMILY HISTORY: No reported history of malignancy or chronic disease.     ADVANCED DIRECTIVES:    HEALTH MAINTENANCE: Social History  Substance Use Topics  . Smoking status:  Current Every Day Smoker -- 0.50 packs/day for 50 years    Types: Cigarettes  . Smokeless tobacco: Never Used  . Alcohol Use: 0.0 oz/week    0 Standard drinks or equivalent per week     Comment: 3 pints/week     Colonoscopy:  PAP:  Bone density:  Lipid panel:  No Known Allergies  Current Outpatient Prescriptions  Medication Sig Dispense Refill  . albuterol (PROVENTIL HFA;VENTOLIN HFA) 108 (90 BASE) MCG/ACT inhaler Inhale 2 puffs into the lungs every 6 (six) hours as needed for wheezing or shortness of breath. 1 Inhaler 0  . amLODipine (NORVASC) 5 MG tablet Take 1 tablet (5 mg total) by mouth daily. 30 tablet 1  . brimonidine (ALPHAGAN) 0.2 % ophthalmic solution Place 1 drop into both eyes 2 (two) times daily.   4  . lidocaine-prilocaine (EMLA) cream Apply 1 application topically as needed. Apply to port 1 hour prior to chemotherapy appointment. Cover with plastic wrap. 30 g 2  . prochlorperazine (COMPAZINE) 10 MG tablet Take 1 tablet (10 mg total) by mouth every 6 (six) hours as needed for nausea or vomiting. 30 tablet 0  . timolol (BETIMOL) 0.5 % ophthalmic solution Place 1 drop into both eyes daily.    . timolol (TIMOPTIC) 0.5 % ophthalmic solution   4  . megestrol (MEGACE) 40 MG tablet Take 1 tablet (40 mg total) by mouth daily. 30 tablet 1  . sildenafil (REVATIO) 20 MG tablet Take 1 tablet (20 mg total) by mouth daily. 5 tablet 0   No current facility-administered medications for this visit.    OBJECTIVE: Filed Vitals:   10/18/15 1035  BP:  177/117  Pulse: 98  Temp: 99.1 F (37.3 C)     Body mass index is 19.26 kg/(m^2).    ECOG FS:0 - Asymptomatic  General: Well-developed, well-nourished, no acute distress. Eyes: Pink conjunctiva, anicteric sclera. Lungs: Clear to auscultation bilaterally. Heart: Regular rate and rhythm. No rubs, murmurs, or gallops. Abdomen: Soft, nontender, nondistended. No organomegaly noted, normoactive bowel sounds. Musculoskeletal: No edema,  cyanosis, or clubbing. Neuro: Alert, answering all questions appropriately. Cranial nerves grossly intact. Skin: No rashes or petechiae noted. Psych: Normal affect.   LAB RESULTS:  Lab Results  Component Value Date   NA 137 10/18/2015   K 3.9 10/18/2015   CL 105 10/18/2015   CO2 24 10/18/2015   GLUCOSE 88 10/18/2015   BUN 13 10/18/2015   CREATININE 0.66 10/18/2015   CALCIUM 8.6* 10/18/2015   PROT 6.9 10/18/2015   ALBUMIN 3.7 10/18/2015   AST 53* 10/18/2015   ALT 17 10/18/2015   ALKPHOS 54 10/18/2015   BILITOT 0.9 10/18/2015   GFRNONAA >60 10/18/2015   GFRAA >60 10/18/2015    Lab Results  Component Value Date   WBC 4.1 10/18/2015   NEUTROABS 2.8 10/18/2015   HGB 11.5* 10/18/2015   HCT 34.1* 10/18/2015   MCV 83.2 10/18/2015   PLT 171 10/18/2015     STUDIES: Mr Jeri Cos GE Contrast  Oct 13, 2015  CLINICAL DATA:  Lung cancer, staging. No reported neurologic complaints. EXAM: MRI HEAD WITHOUT AND WITH CONTRAST TECHNIQUE: Multiplanar, multiecho pulse sequences of the brain and surrounding structures were obtained without and with intravenous contrast. CONTRAST:  75m MULTIHANCE GADOBENATE DIMEGLUMINE 529 MG/ML IV SOLN COMPARISON:  PET scan 09/13/2015. FINDINGS: No evidence for acute infarction, hemorrhage, mass lesion, hydrocephalus, or extra-axial fluid. Mild cerebral and cerebellar atrophy. Mild subcortical and periventricular T2 and FLAIR hyperintensities, likely chronic microvascular ischemic change. Flow voids are maintained in the carotid, basilar, and vertebral arteries. LEFT vertebral dominant. Tiny focus susceptibility in the LEFT parietal white matter, nonspecific but likely sequelae of hypertensive vascular disease. Post infusion, no abnormal enhancement of the brain or meninges. Major dural venous sinuses are patent. Mild paranasal sinus disease. No mastoid fluid. No osseous lesion. Encapsulated 8 x 11 x 27 mm retro mastoid subcutaneous soft tissue abnormality, favored to  represent a benign soft tissue abnormalities such as sebaceous cyst. IMPRESSION: Mild atrophy and small vessel disease. No acute intracranial findings. No enhancing lesions to suggest metastatic disease. Electronically Signed   By: JStaci RighterM.D.   On: 02017/10/107:00    ASSESSMENT: Stage IIIa squamous cell carcinoma of the lung.  PLAN:    1. Lung cancer: Biopsy and PET scan results reviewed independently. Brain MRI also reviewed independently with no evidence of metastatic disease.  Patient subcarinal lymph node is only mildly hypermetabolic, but cannot rule out metastasis. Continue daily XRT. Proceed with cycle 1 of concurrent chemotherapy with carboplatinum and Taxol. Patient will receive his treatments weekly throughout the duration of XRT and then likely receive 2 consolidation doses. Return to clinic in 1 week for laboratory work and treatment and then return to clinic in 2 weeks for further evaluation and consideration of cycle 3. 2. Hypertension: Patient states she is compliant with his medications, monitor. 3. Poor appetite: Patient was given a prescription for Megace today.   Approximately 30 minutes was spent in discussion of which greater than 50% was consultation.   Patient expressed understanding and was in agreement with this plan. He also understands that He can call clinic at any  time with any questions, concerns, or complaints.    Lloyd Huger, MD   10/25/2015 9:55 AM

## 2015-10-26 ENCOUNTER — Inpatient Hospital Stay: Payer: Medicare Other

## 2015-10-26 ENCOUNTER — Ambulatory Visit
Admission: RE | Admit: 2015-10-26 | Discharge: 2015-10-26 | Disposition: A | Discharge status: Home / Self Care | Payer: Medicare Other | Source: Ambulatory Visit | Attending: Radiation Oncology | Admitting: Radiation Oncology

## 2015-10-26 DIAGNOSIS — C3412 Malignant neoplasm of upper lobe, left bronchus or lung: Secondary | ICD-10-CM | POA: Diagnosis not present

## 2015-10-29 ENCOUNTER — Ambulatory Visit
Admission: RE | Admit: 2015-10-29 | Discharge: 2015-10-29 | Disposition: A | Discharge status: Home / Self Care | Payer: Medicare Other | Source: Ambulatory Visit | Attending: Radiation Oncology | Admitting: Radiation Oncology

## 2015-10-29 ENCOUNTER — Inpatient Hospital Stay: Payer: Medicare Other

## 2015-10-29 DIAGNOSIS — C3412 Malignant neoplasm of upper lobe, left bronchus or lung: Secondary | ICD-10-CM | POA: Diagnosis not present

## 2015-10-30 ENCOUNTER — Inpatient Hospital Stay: Payer: Medicare Other

## 2015-10-30 ENCOUNTER — Ambulatory Visit
Admission: RE | Admit: 2015-10-30 | Discharge: 2015-10-30 | Disposition: A | Discharge status: Home / Self Care | Payer: Medicare Other | Source: Ambulatory Visit | Attending: Radiation Oncology | Admitting: Radiation Oncology

## 2015-10-30 DIAGNOSIS — C3412 Malignant neoplasm of upper lobe, left bronchus or lung: Secondary | ICD-10-CM | POA: Diagnosis not present

## 2015-10-31 ENCOUNTER — Ambulatory Visit
Admission: RE | Admit: 2015-10-31 | Discharge: 2015-10-31 | Disposition: A | Discharge status: Home / Self Care | Payer: Medicare Other | Source: Ambulatory Visit | Attending: Radiation Oncology | Admitting: Radiation Oncology

## 2015-10-31 ENCOUNTER — Inpatient Hospital Stay: Payer: Medicare Other

## 2015-10-31 DIAGNOSIS — C3412 Malignant neoplasm of upper lobe, left bronchus or lung: Secondary | ICD-10-CM | POA: Diagnosis not present

## 2015-11-01 ENCOUNTER — Inpatient Hospital Stay (HOSPITAL_BASED_OUTPATIENT_CLINIC_OR_DEPARTMENT_OTHER): Payer: Medicare Other | Admitting: Oncology

## 2015-11-01 ENCOUNTER — Inpatient Hospital Stay: Payer: Medicare Other

## 2015-11-01 ENCOUNTER — Ambulatory Visit
Admission: RE | Admit: 2015-11-01 | Discharge: 2015-11-01 | Disposition: A | Discharge status: Home / Self Care | Payer: Medicare Other | Source: Ambulatory Visit | Attending: Radiation Oncology | Admitting: Radiation Oncology

## 2015-11-01 VITALS — BP 127/85 | HR 118 | Temp 98.0°F | Resp 20 | Wt 142.9 lb

## 2015-11-01 DIAGNOSIS — C3412 Malignant neoplasm of upper lobe, left bronchus or lung: Secondary | ICD-10-CM | POA: Diagnosis not present

## 2015-11-01 DIAGNOSIS — I10 Essential (primary) hypertension: Secondary | ICD-10-CM | POA: Diagnosis not present

## 2015-11-01 DIAGNOSIS — Z79899 Other long term (current) drug therapy: Secondary | ICD-10-CM

## 2015-11-01 DIAGNOSIS — F419 Anxiety disorder, unspecified: Secondary | ICD-10-CM

## 2015-11-01 DIAGNOSIS — J449 Chronic obstructive pulmonary disease, unspecified: Secondary | ICD-10-CM

## 2015-11-01 DIAGNOSIS — F17210 Nicotine dependence, cigarettes, uncomplicated: Secondary | ICD-10-CM

## 2015-11-01 DIAGNOSIS — D61818 Other pancytopenia: Secondary | ICD-10-CM

## 2015-11-01 DIAGNOSIS — C3432 Malignant neoplasm of lower lobe, left bronchus or lung: Secondary | ICD-10-CM

## 2015-11-01 DIAGNOSIS — Z5111 Encounter for antineoplastic chemotherapy: Secondary | ICD-10-CM | POA: Diagnosis not present

## 2015-11-01 DIAGNOSIS — C801 Malignant (primary) neoplasm, unspecified: Secondary | ICD-10-CM

## 2015-11-01 DIAGNOSIS — R634 Abnormal weight loss: Secondary | ICD-10-CM

## 2015-11-01 LAB — CBC WITH DIFFERENTIAL/PLATELET
BASOS ABS: 0 10*3/uL (ref 0–0.1)
EOS ABS: 0.1 10*3/uL (ref 0–0.7)
HEMATOCRIT: 27.5 % — AB (ref 40.0–52.0)
Hemoglobin: 9.2 g/dL — ABNORMAL LOW (ref 13.0–18.0)
Lymphocytes Relative: 36 %
Lymphs Abs: 0.3 10*3/uL — ABNORMAL LOW (ref 1.0–3.6)
MCH: 27.6 pg (ref 26.0–34.0)
MCHC: 33.5 g/dL (ref 32.0–36.0)
MCV: 82.4 fL (ref 80.0–100.0)
MONO ABS: 0.1 10*3/uL — AB (ref 0.2–1.0)
Monocytes Relative: 14 %
NEUTROS ABS: 0.4 10*3/uL — AB (ref 1.4–6.5)
Neutrophils Relative %: 42 %
PLATELETS: 95 10*3/uL — AB (ref 150–440)
RBC: 3.33 MIL/uL — ABNORMAL LOW (ref 4.40–5.90)
RDW: 15 % — AB (ref 11.5–14.5)
WBC: 0.8 10*3/uL — CL (ref 3.8–10.6)

## 2015-11-01 LAB — COMPREHENSIVE METABOLIC PANEL
ALBUMIN: 3.7 g/dL (ref 3.5–5.0)
ALT: 12 U/L — ABNORMAL LOW (ref 17–63)
AST: 19 U/L (ref 15–41)
Alkaline Phosphatase: 51 U/L (ref 38–126)
Anion gap: 5 (ref 5–15)
BUN: 10 mg/dL (ref 6–20)
CHLORIDE: 103 mmol/L (ref 101–111)
CO2: 26 mmol/L (ref 22–32)
Calcium: 8.7 mg/dL — ABNORMAL LOW (ref 8.9–10.3)
Creatinine, Ser: 0.66 mg/dL (ref 0.61–1.24)
GFR calc Af Amer: >60 mL/min (ref >60)
GFR calc non Af Amer: >60 mL/min (ref >60)
GLUCOSE: 122 mg/dL — AB (ref 65–99)
POTASSIUM: 3.6 mmol/L (ref 3.5–5.1)
SODIUM: 134 mmol/L — AB (ref 135–145)
Total Bilirubin: 1.3 mg/dL — ABNORMAL HIGH (ref 0.3–1.2)
Total Protein: 6.3 g/dL — ABNORMAL LOW (ref 6.5–8.1)

## 2015-11-01 MED ORDER — HEPARIN SOD (PORK) LOCK FLUSH 100 UNIT/ML IV SOLN
500.0000 [IU] | Freq: Once | INTRAVENOUS | Status: AC
  Administered 2015-11-01: 500 [IU] via INTRAVENOUS

## 2015-11-01 MED ORDER — HEPARIN SOD (PORK) LOCK FLUSH 100 UNIT/ML IV SOLN
INTRAVENOUS | Status: AC
  Filled 2015-11-01: qty 5

## 2015-11-01 MED ORDER — SODIUM CHLORIDE 0.9% FLUSH
10.0000 mL | Freq: Once | INTRAVENOUS | Status: AC
  Administered 2015-11-01: 10 mL via INTRAVENOUS
  Filled 2015-11-01: qty 10

## 2015-11-01 NOTE — Progress Notes (Signed)
Patient here today for ongoing follow up and treatment consideration regarding lung cancer. Patient denies any concerns today.

## 2015-11-02 ENCOUNTER — Ambulatory Visit: Payer: Medicare Other

## 2015-11-02 ENCOUNTER — Inpatient Hospital Stay: Payer: Medicare Other

## 2015-11-02 ENCOUNTER — Ambulatory Visit
Admission: RE | Admit: 2015-11-02 | Discharge: 2015-11-02 | Disposition: A | Discharge status: Home / Self Care | Payer: Medicare Other | Source: Ambulatory Visit | Attending: Radiation Oncology | Admitting: Radiation Oncology

## 2015-11-02 DIAGNOSIS — C3412 Malignant neoplasm of upper lobe, left bronchus or lung: Secondary | ICD-10-CM | POA: Diagnosis not present

## 2015-11-04 NOTE — Progress Notes (Signed)
[place]  [phone]) [phone] Fax:(336) [phone]  ID: Carl Bell OB: April 27, 1951  MR#: [id number]  [id number]#:[id number]  Patient Care Team: Kirk Ruths, MD as PCP - General (Internal Medicine)  CHIEF COMPLAINT:  Chief Complaint  Patient presents with  . Lung Cancer    follow up    INTERVAL HISTORY: Patient returns to clinic today for further evaluation and consideration of cycle 3 of weekly carboplatinum and Taxol. He continues to tolerate his daily XRT. His only complaint today is of persistent decreased appetite. He has no neurologic complaints. He denies any recent fevers. He continues to have a cough, but denies chest pain or shortness of breath. He denies any hemoptysis. He denies any nausea, vomiting, constipation, or diarrhea. He has no urinary complaints. Patient offers no further specific complaints  REVIEW OF SYSTEMS:   Review of Systems  Constitutional: Negative for fever, weight loss and malaise/fatigue.  Respiratory: Positive for cough. Negative for hemoptysis and shortness of breath.   Cardiovascular: Negative.  Negative for chest pain.  Gastrointestinal: Negative.   Musculoskeletal: Negative.   Neurological: Negative.  Negative for weakness.  Psychiatric/Behavioral: The patient is nervous/anxious.     As per HPI. Otherwise, a complete review of systems is negatve.  PAST MEDICAL HISTORY: Past Medical History  Diagnosis Date  . COPD (chronic obstructive pulmonary disease) ([place])   . Glaucoma   . Hypertension     PAST SURGICAL HISTORY: Past Surgical History  Procedure Laterality Date  . Peripheral vascular catheterization N/A 09/24/2015    Procedure: [name];  Surgeon: Algernon Huxley, MD;  Location: [facility];  Service: Cardiovascular;  Laterality: N/A;    FAMILY HISTORY: Reviewed and unchanged. No reported history of malignancy or chronic disease.     ADVANCED DIRECTIVES:    HEALTH MAINTENANCE: Social  History  Substance Use Topics  . Smoking status: Current Every Day Smoker -- 0.50 packs/day for 50 years    Types: Cigarettes  . Smokeless tobacco: Never Used  . Alcohol Use: 0.0 oz/week    0 Standard drinks or equivalent per week     Comment: 3 pints/week     Colonoscopy:  PAP:  Bone density:  Lipid panel:  No Known Allergies  Current Outpatient Prescriptions  Medication Sig Dispense Refill  . albuterol (PROVENTIL HFA;VENTOLIN HFA) 108 (90 BASE) MCG/ACT inhaler Inhale 2 puffs into the lungs every 6 (six) hours as needed for wheezing or shortness of breath. 1 Inhaler 0  . amLODipine (NORVASC) 5 MG tablet Take 1 tablet (5 mg total) by mouth daily. 30 tablet 1  . brimonidine (ALPHAGAN) 0.2 % ophthalmic solution Place 1 drop into both eyes 2 (two) times daily.   4  . lidocaine-prilocaine (EMLA) cream Apply 1 application topically as needed. Apply to port 1 hour prior to chemotherapy appointment. Cover with plastic wrap. 30 g 2  . megestrol (MEGACE) 40 MG tablet Take 1 tablet (40 mg total) by mouth daily. 30 tablet 1  . prochlorperazine (COMPAZINE) 10 MG tablet Take 1 tablet (10 mg total) by mouth every 6 (six) hours as needed for nausea or vomiting. 30 tablet 0  . sildenafil (REVATIO) 20 MG tablet Take 1 tablet (20 mg total) by mouth daily. 5 tablet 0  . timolol (BETIMOL) 0.5 % ophthalmic solution Place 1 drop into both eyes daily.    . timolol (TIMOPTIC) 0.5 % ophthalmic solution   4   No current facility-administered medications for this visit.  OBJECTIVE: Filed Vitals:   11/01/15 1128  BP: 127/85  Pulse: 118  Temp: 98 F (36.7 C)  Resp: 20     Body mass index is 18.85 kg/(m^2).    ECOG FS:0 - Asymptomatic  General: Well-developed, well-nourished, no acute distress. Eyes: Pink conjunctiva, anicteric sclera. Lungs: Clear to auscultation bilaterally. Heart: Regular rate and rhythm. No rubs, murmurs, or gallops. Abdomen: Soft, nontender, nondistended. No organomegaly  noted, normoactive bowel sounds. Musculoskeletal: No edema, cyanosis, or clubbing. Neuro: Alert, answering all questions appropriately. Cranial nerves grossly intact. Skin: No rashes or petechiae noted. Psych: Normal affect.   LAB RESULTS:  Lab Results  Component Value Date   NA 134* 11/01/2015   K 3.6 11/01/2015   CL 103 11/01/2015   CO2 26 11/01/2015   GLUCOSE 122* 11/01/2015   BUN 10 11/01/2015   CREATININE 0.66 11/01/2015   CALCIUM 8.7* 11/01/2015   PROT 6.3* 11/01/2015   ALBUMIN 3.7 11/01/2015   AST 19 11/01/2015   ALT 12* 11/01/2015   ALKPHOS 51 11/01/2015   BILITOT 1.3* 11/01/2015   GFRNONAA >60 11/01/2015   GFRAA >60 11/01/2015    Lab Results  Component Value Date   WBC 0.8* 11/01/2015   NEUTROABS 0.4* 11/01/2015   HGB 9.2* 11/01/2015   HCT 27.5* 11/01/2015   MCV 82.4 11/01/2015   PLT 95* 11/01/2015     STUDIES: No results found.  ASSESSMENT: Stage IIIa squamous cell carcinoma of the lung.  PLAN:    1. Lung cancer: Biopsy and PET scan results reviewed independently. Brain MRI also reviewed independently with no evidence of metastatic disease.  Patient subcarinal lymph node is only mildly hypermetabolic, but cannot rule out metastasis. Continue daily XRT. Delay cycle 3 of concurrent chemotherapy with carboplatinum and Taxol secondary to pancytopenia. Patient will receive his treatments weekly throughout the duration of XRT and then likely receive 2 consolidation doses. Return to clinic in 1 week for laboratory work and reconsideration of cycle 3. 2. Hypertension: Blood pressure improved, monitor. 3. Poor appetite: Continue Megace as prescribed. 4. Pancytopenia: Delay treatment as above. Consider dose reduction.  Patient expressed understanding and was in agreement with this plan. He also understands that He can call clinic at any time with any questions, concerns, or complaints.    Lloyd Huger, MD   11/04/2015 10:45 PM

## 2015-11-05 ENCOUNTER — Inpatient Hospital Stay: Payer: Medicare Other

## 2015-11-05 ENCOUNTER — Ambulatory Visit: Payer: Medicare Other

## 2015-11-06 ENCOUNTER — Inpatient Hospital Stay: Payer: Medicare Other

## 2015-11-06 ENCOUNTER — Ambulatory Visit: Payer: Medicare Other

## 2015-11-07 ENCOUNTER — Inpatient Hospital Stay: Payer: Medicare Other | Admitting: Oncology

## 2015-11-07 ENCOUNTER — Inpatient Hospital Stay: Payer: Medicare Other

## 2015-11-07 ENCOUNTER — Ambulatory Visit: Payer: Medicare Other

## 2015-11-08 ENCOUNTER — Ambulatory Visit: Payer: Medicare Other

## 2015-11-08 ENCOUNTER — Other Ambulatory Visit: Payer: Self-pay | Admitting: *Deleted

## 2015-11-08 ENCOUNTER — Inpatient Hospital Stay (HOSPITAL_BASED_OUTPATIENT_CLINIC_OR_DEPARTMENT_OTHER): Payer: Medicare Other | Admitting: Oncology

## 2015-11-08 ENCOUNTER — Inpatient Hospital Stay: Payer: Medicare Other

## 2015-11-08 ENCOUNTER — Encounter: Payer: Self-pay | Admitting: Radiation Oncology

## 2015-11-08 ENCOUNTER — Ambulatory Visit
Admission: RE | Admit: 2015-11-08 | Discharge: 2015-11-08 | Disposition: A | Discharge status: Home / Self Care | Payer: Medicare Other | Source: Ambulatory Visit | Attending: Radiation Oncology | Admitting: Radiation Oncology

## 2015-11-08 VITALS — BP 153/94 | HR 93 | Resp 18

## 2015-11-08 VITALS — BP 156/110 | HR 99 | Temp 97.8°F | Resp 18 | Wt 143.1 lb

## 2015-11-08 VITALS — BP 164/106 | HR 102 | Temp 97.6°F | Resp 20 | Wt 143.2 lb

## 2015-11-08 DIAGNOSIS — R1310 Dysphagia, unspecified: Secondary | ICD-10-CM

## 2015-11-08 DIAGNOSIS — D61818 Other pancytopenia: Secondary | ICD-10-CM

## 2015-11-08 DIAGNOSIS — T451X5S Adverse effect of antineoplastic and immunosuppressive drugs, sequela: Secondary | ICD-10-CM | POA: Diagnosis not present

## 2015-11-08 DIAGNOSIS — R634 Abnormal weight loss: Secondary | ICD-10-CM

## 2015-11-08 DIAGNOSIS — C3412 Malignant neoplasm of upper lobe, left bronchus or lung: Secondary | ICD-10-CM | POA: Diagnosis not present

## 2015-11-08 DIAGNOSIS — C3490 Malignant neoplasm of unspecified part of unspecified bronchus or lung: Secondary | ICD-10-CM

## 2015-11-08 DIAGNOSIS — F419 Anxiety disorder, unspecified: Secondary | ICD-10-CM

## 2015-11-08 DIAGNOSIS — C3432 Malignant neoplasm of lower lobe, left bronchus or lung: Secondary | ICD-10-CM

## 2015-11-08 DIAGNOSIS — D6481 Anemia due to antineoplastic chemotherapy: Secondary | ICD-10-CM

## 2015-11-08 DIAGNOSIS — J449 Chronic obstructive pulmonary disease, unspecified: Secondary | ICD-10-CM

## 2015-11-08 DIAGNOSIS — Z5111 Encounter for antineoplastic chemotherapy: Secondary | ICD-10-CM | POA: Diagnosis not present

## 2015-11-08 DIAGNOSIS — I10 Essential (primary) hypertension: Secondary | ICD-10-CM

## 2015-11-08 DIAGNOSIS — Z79899 Other long term (current) drug therapy: Secondary | ICD-10-CM

## 2015-11-08 DIAGNOSIS — F17210 Nicotine dependence, cigarettes, uncomplicated: Secondary | ICD-10-CM

## 2015-11-08 LAB — COMPREHENSIVE METABOLIC PANEL
ALBUMIN: 3.4 g/dL — AB (ref 3.5–5.0)
ALK PHOS: 50 U/L (ref 38–126)
ALT: 10 U/L — ABNORMAL LOW (ref 17–63)
ANION GAP: 8 (ref 5–15)
AST: 20 U/L (ref 15–41)
BILIRUBIN TOTAL: 0.4 mg/dL (ref 0.3–1.2)
BUN: 9 mg/dL (ref 6–20)
CALCIUM: 8.8 mg/dL — AB (ref 8.9–10.3)
CO2: 22 mmol/L (ref 22–32)
Chloride: 112 mmol/L — ABNORMAL HIGH (ref 101–111)
Creatinine, Ser: 0.72 mg/dL (ref 0.61–1.24)
GFR calc Af Amer: >60 mL/min (ref >60)
GLUCOSE: 87 mg/dL (ref 65–99)
Potassium: 3.7 mmol/L (ref 3.5–5.1)
Sodium: 142 mmol/L (ref 135–145)
TOTAL PROTEIN: 6.2 g/dL — AB (ref 6.5–8.1)

## 2015-11-08 LAB — CBC WITH DIFFERENTIAL/PLATELET
BASOS PCT: 1 %
Basophils Absolute: 0 10*3/uL (ref 0–0.1)
Eosinophils Absolute: 0.1 10*3/uL (ref 0–0.7)
Eosinophils Relative: 5 %
HEMATOCRIT: 27.4 % — AB (ref 40.0–52.0)
HEMOGLOBIN: 9.3 g/dL — AB (ref 13.0–18.0)
Lymphocytes Relative: 21 %
Lymphs Abs: 0.5 10*3/uL — ABNORMAL LOW (ref 1.0–3.6)
MCH: 27.9 pg (ref 26.0–34.0)
MCHC: 33.9 g/dL (ref 32.0–36.0)
MCV: 82.3 fL (ref 80.0–100.0)
Monocytes Absolute: 0.5 10*3/uL (ref 0.2–1.0)
Monocytes Relative: 22 %
NEUTROS ABS: 1.2 10*3/uL — AB (ref 1.4–6.5)
NEUTROS PCT: 51 %
Platelets: 172 10*3/uL (ref 150–440)
RBC: 3.33 MIL/uL — AB (ref 4.40–5.90)
RDW: 15.2 % — ABNORMAL HIGH (ref 11.5–14.5)
WBC: 2.4 10*3/uL — AB (ref 3.8–10.6)

## 2015-11-08 MED ORDER — SODIUM CHLORIDE 0.9 % IV SOLN
187.0000 mg | Freq: Once | INTRAVENOUS | Status: AC
  Administered 2015-11-08: 190 mg via INTRAVENOUS
  Filled 2015-11-08: qty 19

## 2015-11-08 MED ORDER — HEPARIN SOD (PORK) LOCK FLUSH 100 UNIT/ML IV SOLN
500.0000 [IU] | Freq: Once | INTRAVENOUS | Status: AC | PRN
  Administered 2015-11-08: 500 [IU]
  Filled 2015-11-08: qty 5

## 2015-11-08 MED ORDER — SODIUM CHLORIDE 0.9% FLUSH
10.0000 mL | INTRAVENOUS | Status: DC | PRN
  Administered 2015-11-08: 10 mL
  Filled 2015-11-08: qty 10

## 2015-11-08 MED ORDER — SODIUM CHLORIDE 0.9 % IV SOLN
80.0000 mg/m2 | Freq: Once | INTRAVENOUS | Status: AC
  Administered 2015-11-08: 150 mg via INTRAVENOUS
  Filled 2015-11-08: qty 25

## 2015-11-08 MED ORDER — SODIUM CHLORIDE 0.9 % IV SOLN
Freq: Once | INTRAVENOUS | Status: AC
  Administered 2015-11-08: 10:00:00 via INTRAVENOUS
  Filled 2015-11-08: qty 1000

## 2015-11-08 MED ORDER — SUCRALFATE 1 G PO TABS: 1.0000 g | ORAL_TABLET | Freq: Three times a day (TID) | ORAL | Status: DC

## 2015-11-08 MED ORDER — PACLITAXEL CHEMO INJECTION 300 MG/50ML: 80.0000 mg/m2 | Freq: Once | INTRAVENOUS | Status: DC

## 2015-11-08 MED ORDER — FAMOTIDINE IN NACL 20-0.9 MG/50ML-% IV SOLN
20.0000 mg | Freq: Once | INTRAVENOUS | Status: AC
  Administered 2015-11-08: 20 mg via INTRAVENOUS
  Filled 2015-11-08: qty 50

## 2015-11-08 MED ORDER — DIPHENHYDRAMINE HCL 50 MG/ML IJ SOLN
25.0000 mg | Freq: Once | INTRAMUSCULAR | Status: AC
  Administered 2015-11-08: 25 mg via INTRAVENOUS
  Filled 2015-11-08: qty 1

## 2015-11-08 MED ORDER — PALONOSETRON HCL INJECTION 0.25 MG/5ML
0.2500 mg | Freq: Once | INTRAVENOUS | Status: AC
  Administered 2015-11-08: 0.25 mg via INTRAVENOUS
  Filled 2015-11-08: qty 5

## 2015-11-08 MED ORDER — DEXAMETHASONE SODIUM PHOSPHATE 100 MG/10ML IJ SOLN
10.0000 mg | Freq: Once | INTRAMUSCULAR | Status: AC
  Administered 2015-11-08: 10 mg via INTRAVENOUS
  Filled 2015-11-08: qty 1

## 2015-11-08 NOTE — Progress Notes (Signed)
Patient has been having difficulty swallowing so Dr. Baruch Gouty sent Carafate to his pharmacy today.  Blood pressure is elevated today at 156/110 but he states that he has not taken his bp med today.

## 2015-11-08 NOTE — Progress Notes (Signed)
Radiation Oncology Follow up Note  Name: Carl Bell   Date:   11/08/2015 MRN:  [id number] DOB: 02-Feb-1951    This 65 y.o. male presents to the clinic today for follow-up for stage IIIa squamous cell carcinoma of left upper lobe status post initial course of large field radiation.  REFERRING PROVIDER: Kirk Ruths, MD  HPI: Patient is a 65 year old male who completed 4000 cGy to his chest for a large field left upper lobe squamous cell carcinoma stage IIIa (T2 N2 M0). He is doing fairly well. Complains of some dysphasia which we would expect from radiation esophagitis. Specifically denies cough hemoptysis or chest tightness..  COMPLICATIONS OF TREATMENT: none  FOLLOW UP COMPLIANCE: keeps appointments   PHYSICAL EXAM:  BP 164/106 mmHg  Pulse 102  Temp(Src) 97.6 F (36.4 C)  Resp 20  Wt 143 lb 3 oz (64.95 kg) Well-developed well-nourished patient in NAD. HEENT reveals PERLA, EOMI, discs not visualized.  Oral cavity is clear. No oral mucosal lesions are identified. Neck is clear without evidence of cervical or supraclavicular adenopathy. Lungs are clear to A&P. Cardiac examination is essentially unremarkable with regular rate and rhythm without murmur rub or thrill. Abdomen is benign with no organomegaly or masses noted. Motor sensory and DTR levels are equal and symmetric in the upper and lower extremities. Cranial nerves II through XII are grossly intact. Proprioception is intact. No peripheral adenopathy or edema is identified. No motor or sensory levels are noted. Crude visual fields are within normal range.  RADIOLOGY RESULTS: No current films for review  PLAN: At this time like to rescan him for possible small field boost 2000 cGy in 10 fractions should he have achieved a significant response from prior therapy. I've also started on Carafate rinses 3 times a day for his dysphagia. I have personally set up and ordered CT simulation early next week. Will review his films with  the patient after CT simulation.  I would like to take this opportunity for allowing me to participate in the care of your patient.Armstead Peaks., MD

## 2015-11-08 NOTE — Progress Notes (Signed)
[name]  Telephone:(336) [phone] Fax:(336) [phone]  ID: Carl Bell OB: 18-Nov-1950  MR#: [id number]  SWN#:[id number]  Patient Care Team: Kirk Ruths, MD as PCP - General (Internal Medicine)  CHIEF COMPLAINT:  Chief Complaint  Patient presents with  . Lung Cancer    INTERVAL HISTORY: Patient returns to clinic today for further evaluation and consideration of cycle 3 of weekly carboplatinum and Taxol. He is having a week break from XRT at this time. He has no complaints today and feels well. He has no neurologic complaints. He denies any recent fevers. He denies chest pain or shortness of breath. He denies any hemoptysis. He denies any nausea, vomiting, constipation, or diarrhea. He has no urinary complaints. Patient offers no further specific complaints  REVIEW OF SYSTEMS:   Review of Systems  Constitutional: Negative for fever, weight loss and malaise/fatigue.  Respiratory: Negative for cough, hemoptysis and shortness of breath.   Cardiovascular: Negative.  Negative for chest pain.  Gastrointestinal: Negative.   Musculoskeletal: Negative.   Neurological: Negative.  Negative for weakness.  Psychiatric/Behavioral: The patient is not nervous/anxious.     As per HPI. Otherwise, a complete review of systems is negatve.  PAST MEDICAL HISTORY: Past Medical History  Diagnosis Date  . COPD (chronic obstructive pulmonary disease) ([place])   . Glaucoma   . Hypertension     PAST SURGICAL HISTORY: Past Surgical History  Procedure Laterality Date  . Peripheral vascular catheterization N/A 09/24/2015    Procedure: [name];  Surgeon: Algernon Huxley, MD;  Location: [facility];  Service: Cardiovascular;  Laterality: N/A;    FAMILY HISTORY: Reviewed and unchanged. No reported history of malignancy or chronic disease.     ADVANCED DIRECTIVES:    HEALTH MAINTENANCE: Social History  Substance Use Topics  . Smoking status: Current Every  Day Smoker -- 0.50 packs/day for 50 years    Types: Cigarettes  . Smokeless tobacco: Never Used  . Alcohol Use: 0.0 oz/week    0 Standard drinks or equivalent per week     Comment: 3 pints/week     No Known Allergies  Current Outpatient Prescriptions  Medication Sig Dispense Refill  . albuterol (PROVENTIL HFA;VENTOLIN HFA) 108 (90 BASE) MCG/ACT inhaler Inhale 2 puffs into the lungs every 6 (six) hours as needed for wheezing or shortness of breath. 1 Inhaler 0  . amLODipine (NORVASC) 5 MG tablet Take 1 tablet (5 mg total) by mouth daily. 30 tablet 1  . brimonidine (ALPHAGAN) 0.2 % ophthalmic solution Place 1 drop into both eyes 2 (two) times daily.   4  . lidocaine-prilocaine (EMLA) cream Apply 1 application topically as needed. Apply to port 1 hour prior to chemotherapy appointment. Cover with plastic wrap. 30 g 2  . megestrol (MEGACE) 40 MG tablet Take 1 tablet (40 mg total) by mouth daily. 30 tablet 1  . prochlorperazine (COMPAZINE) 10 MG tablet Take 1 tablet (10 mg total) by mouth every 6 (six) hours as needed for nausea or vomiting. 30 tablet 0  . sildenafil (REVATIO) 20 MG tablet Take 1 tablet (20 mg total) by mouth daily. 5 tablet 0  . sucralfate (CARAFATE) 1 g tablet Take 1 tablet (1 g total) by mouth 4 (four) times daily -  with meals and at bedtime. 90 tablet 6  . timolol (BETIMOL) 0.5 % ophthalmic solution Place 1 drop into both eyes daily.    . timolol (TIMOPTIC) 0.5 % ophthalmic solution   4  No current facility-administered medications for this visit.   Facility-Administered Medications Ordered in Other Visits  Medication Dose Route Frequency Provider Last Rate Last Dose  . CARBOplatin (PARAPLATIN) 190 mg in sodium chloride 0.9 % 100 mL chemo infusion  190 mg Intravenous Once Lloyd Huger, MD      . dexamethasone (DECADRON) 10 mg in sodium chloride 0.9 % 50 mL IVPB  10 mg Intravenous Once Lloyd Huger, MD      . famotidine (PEPCID) IVPB 20 mg premix  20 mg  Intravenous Once Lloyd Huger, MD   20 mg at 11/08/15 1003  . heparin lock flush 100 unit/mL  500 Units Intracatheter Once PRN Lloyd Huger, MD      . PACLitaxel (TAXOL) 150 mg in sodium chloride 0.9 % 250 mL chemo infusion (</= '80mg'$ /m2)  80 mg/m2 (Treatment Plan Actual) Intravenous Once Lloyd Huger, MD      . sodium chloride flush (NS) 0.9 % injection 10 mL  10 mL Intracatheter PRN Lloyd Huger, MD   10 mL at 11/08/15 0811    OBJECTIVE: Filed Vitals:   11/08/15 0857  BP: 156/110  Pulse: 99  Temp: 97.8 F (36.6 C)  Resp: 18     Body mass index is 18.88 kg/(m^2).    ECOG FS:0 - Asymptomatic  General: Well-developed, well-nourished, no acute distress. Eyes: Pink conjunctiva, anicteric sclera. Lungs: Clear to auscultation bilaterally. Heart: Regular rate and rhythm. No rubs, murmurs, or gallops. Abdomen: Soft, nontender, nondistended. No organomegaly noted, normoactive bowel sounds. Musculoskeletal: No edema, cyanosis, or clubbing. Neuro: Alert, answering all questions appropriately. Cranial nerves grossly intact. Skin: No rashes or petechiae noted. Psych: Normal affect.   LAB RESULTS:  Lab Results  Component Value Date   NA 142 11/08/2015   K 3.7 11/08/2015   CL 112* 11/08/2015   CO2 22 11/08/2015   GLUCOSE 87 11/08/2015   BUN 9 11/08/2015   CREATININE 0.72 11/08/2015   CALCIUM 8.8* 11/08/2015   PROT 6.2* 11/08/2015   ALBUMIN 3.4* 11/08/2015   AST 20 11/08/2015   ALT 10* 11/08/2015   ALKPHOS 50 11/08/2015   BILITOT 0.4 11/08/2015   GFRNONAA >60 11/08/2015   GFRAA >60 11/08/2015    Lab Results  Component Value Date   WBC 2.4* 11/08/2015   NEUTROABS 1.2* 11/08/2015   HGB 9.3* 11/08/2015   HCT 27.4* 11/08/2015   MCV 82.3 11/08/2015   PLT 172 11/08/2015     STUDIES: No results found.  ASSESSMENT: Stage IIIa squamous cell carcinoma of the lung.  PLAN:    1. Lung cancer:  Proceed with cycle 3 of concurrent chemotherapy with  carboplatinum and Taxol today. Patient will receive his treatments weekly throughout the duration of XRT and then likely receive 2 consolidation doses. He has one week off from radiation and then he restarts on May 8th or 9th for 10 more treatments. Return to clinic in 1 week for laboratory work and consideration of cycle 4. 2. Anemia: HGB 9.3 secondary to chemotherapy. 3. Swallowing difficulty: Carafate sent to pharmacy by Dr Donella Stade.   Patient expressed understanding and was in agreement with this plan. He also understands that He can call clinic at any time with any questions, concerns, or complaints.    Mayra Reel, NP   11/08/2015 10:07 AM  Patient was seen and evaluated independently and I agree with the assessment and plan as dictated above.  Lloyd Huger, MD 11/09/2015 12:29 PM

## 2015-11-09 ENCOUNTER — Ambulatory Visit: Payer: Medicare Other | Admitting: Radiation Oncology

## 2015-11-09 ENCOUNTER — Inpatient Hospital Stay: Payer: Medicare Other

## 2015-11-09 ENCOUNTER — Ambulatory Visit: Payer: Medicare Other

## 2015-11-09 ENCOUNTER — Inpatient Hospital Stay: Admission: RE | Admit: 2015-11-09 | Payer: Medicare Other | Source: Ambulatory Visit | Admitting: Radiation Oncology

## 2015-11-12 ENCOUNTER — Ambulatory Visit: Payer: Medicare Other

## 2015-11-12 ENCOUNTER — Inpatient Hospital Stay: Payer: Medicare Other

## 2015-11-13 ENCOUNTER — Ambulatory Visit: Payer: Medicare Other

## 2015-11-13 ENCOUNTER — Ambulatory Visit
Admission: RE | Admit: 2015-11-13 | Discharge: 2015-11-13 | Disposition: A | Discharge status: Home / Self Care | Payer: Medicare Other | Source: Ambulatory Visit | Attending: Radiation Oncology | Admitting: Radiation Oncology

## 2015-11-13 ENCOUNTER — Inpatient Hospital Stay: Payer: Medicare Other

## 2015-11-13 DIAGNOSIS — C3412 Malignant neoplasm of upper lobe, left bronchus or lung: Secondary | ICD-10-CM | POA: Diagnosis not present

## 2015-11-14 ENCOUNTER — Inpatient Hospital Stay: Payer: Medicare Other

## 2015-11-14 ENCOUNTER — Ambulatory Visit: Payer: Medicare Other

## 2015-11-15 ENCOUNTER — Inpatient Hospital Stay: Payer: Medicare Other

## 2015-11-15 ENCOUNTER — Inpatient Hospital Stay (HOSPITAL_BASED_OUTPATIENT_CLINIC_OR_DEPARTMENT_OTHER): Payer: Medicare Other | Admitting: Oncology

## 2015-11-15 ENCOUNTER — Inpatient Hospital Stay: Payer: Medicare Other | Attending: Oncology

## 2015-11-15 ENCOUNTER — Ambulatory Visit: Payer: Medicare Other

## 2015-11-15 VITALS — BP 146/98 | HR 93 | Temp 98.1°F | Resp 18 | Wt 140.9 lb

## 2015-11-15 DIAGNOSIS — I10 Essential (primary) hypertension: Secondary | ICD-10-CM | POA: Insufficient documentation

## 2015-11-15 DIAGNOSIS — Z79899 Other long term (current) drug therapy: Secondary | ICD-10-CM | POA: Diagnosis not present

## 2015-11-15 DIAGNOSIS — C3490 Malignant neoplasm of unspecified part of unspecified bronchus or lung: Secondary | ICD-10-CM

## 2015-11-15 DIAGNOSIS — C3412 Malignant neoplasm of upper lobe, left bronchus or lung: Secondary | ICD-10-CM | POA: Diagnosis not present

## 2015-11-15 DIAGNOSIS — F17210 Nicotine dependence, cigarettes, uncomplicated: Secondary | ICD-10-CM

## 2015-11-15 DIAGNOSIS — Z5111 Encounter for antineoplastic chemotherapy: Secondary | ICD-10-CM | POA: Insufficient documentation

## 2015-11-15 DIAGNOSIS — D649 Anemia, unspecified: Secondary | ICD-10-CM

## 2015-11-15 DIAGNOSIS — J449 Chronic obstructive pulmonary disease, unspecified: Secondary | ICD-10-CM | POA: Insufficient documentation

## 2015-11-15 DIAGNOSIS — D696 Thrombocytopenia, unspecified: Secondary | ICD-10-CM | POA: Insufficient documentation

## 2015-11-15 DIAGNOSIS — D72819 Decreased white blood cell count, unspecified: Secondary | ICD-10-CM | POA: Insufficient documentation

## 2015-11-15 DIAGNOSIS — R1310 Dysphagia, unspecified: Secondary | ICD-10-CM | POA: Diagnosis not present

## 2015-11-15 DIAGNOSIS — C3432 Malignant neoplasm of lower lobe, left bronchus or lung: Secondary | ICD-10-CM

## 2015-11-15 DIAGNOSIS — H409 Unspecified glaucoma: Secondary | ICD-10-CM | POA: Diagnosis not present

## 2015-11-15 LAB — CBC WITH DIFFERENTIAL/PLATELET
BASOS ABS: 0 10*3/uL (ref 0–0.1)
Basophils Relative: 1 %
Eosinophils Absolute: 0 10*3/uL (ref 0–0.7)
Eosinophils Relative: 2 %
HEMATOCRIT: 24.2 % — AB (ref 40.0–52.0)
Hemoglobin: 8.2 g/dL — ABNORMAL LOW (ref 13.0–18.0)
LYMPHS ABS: 0.4 10*3/uL — AB (ref 1.0–3.6)
LYMPHS PCT: 13 %
MCH: 28.1 pg (ref 26.0–34.0)
MCHC: 33.9 g/dL (ref 32.0–36.0)
MCV: 82.8 fL (ref 80.0–100.0)
Monocytes Absolute: 0.2 10*3/uL (ref 0.2–1.0)
Monocytes Relative: 6 %
NEUTROS ABS: 2.2 10*3/uL (ref 1.4–6.5)
Neutrophils Relative %: 78 %
Platelets: 107 10*3/uL — ABNORMAL LOW (ref 150–440)
RBC: 2.92 MIL/uL — AB (ref 4.40–5.90)
RDW: 15.5 % — ABNORMAL HIGH (ref 11.5–14.5)
WBC: 2.7 10*3/uL — AB (ref 3.8–10.6)

## 2015-11-15 LAB — COMPREHENSIVE METABOLIC PANEL
ALK PHOS: 47 U/L (ref 38–126)
ALT: 9 U/L — AB (ref 17–63)
AST: 20 U/L (ref 15–41)
Albumin: 3.4 g/dL — ABNORMAL LOW (ref 3.5–5.0)
Anion gap: 9 (ref 5–15)
BILIRUBIN TOTAL: 1.3 mg/dL — AB (ref 0.3–1.2)
BUN: 10 mg/dL (ref 6–20)
CALCIUM: 8.8 mg/dL — AB (ref 8.9–10.3)
CO2: 23 mmol/L (ref 22–32)
CREATININE: 0.54 mg/dL — AB (ref 0.61–1.24)
Chloride: 107 mmol/L (ref 101–111)
GFR calc Af Amer: >60 mL/min (ref >60)
Glucose, Bld: 92 mg/dL (ref 65–99)
Potassium: 3.4 mmol/L — ABNORMAL LOW (ref 3.5–5.1)
Sodium: 139 mmol/L (ref 135–145)
TOTAL PROTEIN: 6.3 g/dL — AB (ref 6.5–8.1)

## 2015-11-15 MED ORDER — SODIUM CHLORIDE 0.9 % IV SOLN
187.0000 mg | Freq: Once | INTRAVENOUS | Status: AC
  Administered 2015-11-15: 190 mg via INTRAVENOUS
  Filled 2015-11-15: qty 19

## 2015-11-15 MED ORDER — SODIUM CHLORIDE 0.9% FLUSH
10.0000 mL | Freq: Once | INTRAVENOUS | Status: AC
  Administered 2015-11-15: 10 mL via INTRAVENOUS
  Filled 2015-11-15: qty 10

## 2015-11-15 MED ORDER — HEPARIN SOD (PORK) LOCK FLUSH 100 UNIT/ML IV SOLN
500.0000 [IU] | Freq: Once | INTRAVENOUS | Status: AC
  Administered 2015-11-15: 500 [IU] via INTRAVENOUS
  Filled 2015-11-15: qty 5

## 2015-11-15 MED ORDER — PALONOSETRON HCL INJECTION 0.25 MG/5ML
0.2500 mg | Freq: Once | INTRAVENOUS | Status: AC
  Administered 2015-11-15: 0.25 mg via INTRAVENOUS
  Filled 2015-11-15: qty 5

## 2015-11-15 MED ORDER — PACLITAXEL CHEMO INJECTION 300 MG/50ML
80.0000 mg/m2 | Freq: Once | INTRAVENOUS | Status: AC
  Administered 2015-11-15: 150 mg via INTRAVENOUS
  Filled 2015-11-15: qty 25

## 2015-11-15 MED ORDER — SODIUM CHLORIDE 0.9 % IV SOLN
Freq: Once | INTRAVENOUS | Status: AC
Start: 2015-11-15 — End: 2015-11-15
  Administered 2015-11-15: 10:00:00 via INTRAVENOUS
  Filled 2015-11-15: qty 1000

## 2015-11-15 MED ORDER — FAMOTIDINE IN NACL 20-0.9 MG/50ML-% IV SOLN
20.0000 mg | Freq: Once | INTRAVENOUS | Status: AC
  Administered 2015-11-15: 20 mg via INTRAVENOUS
  Filled 2015-11-15: qty 50

## 2015-11-15 MED ORDER — DIPHENHYDRAMINE HCL 50 MG/ML IJ SOLN
25.0000 mg | Freq: Once | INTRAMUSCULAR | Status: AC
  Administered 2015-11-15: 25 mg via INTRAVENOUS
  Filled 2015-11-15: qty 1

## 2015-11-15 MED ORDER — SODIUM CHLORIDE 0.9 % IV SOLN
10.0000 mg | Freq: Once | INTRAVENOUS | Status: AC
  Administered 2015-11-15: 10 mg via INTRAVENOUS
  Filled 2015-11-15: qty 1

## 2015-11-15 NOTE — Progress Notes (Signed)
Patient's appetite has decreased due to a metallic taste in food.  Has been feeling weak.  The Carafate did help with the difficulty swallowing.

## 2015-11-19 NOTE — Progress Notes (Signed)
[name]  [phone] [phone]  ID: Carl Bell OB: 04/19/1951  MR#: [id number]  MWU#:[id number]  Patient Care Team: Kirk Ruths, MD as PCP - General (Internal Medicine)  CHIEF COMPLAINT:  Chief Complaint  Patient presents with  . Lung Cancer    INTERVAL HISTORY: Patient returns to clinic today for further evaluation and consideration of cycle 4 of weekly carboplatinum and Taxol. He now has reinitiated daily XRT.  He currently feels well and is asymptomatic.  He has no neurologic complaints. He denies any recent fevers. He denies chest pain or shortness of breath. He denies any hemoptysis.  He denies any nausea, vomiting, constipation, or diarrhea. He has no urinary complaints. Patient offers no further specific complaints  REVIEW OF SYSTEMS:   Review of Systems  Constitutional: Negative for fever, weight loss and malaise/fatigue.  Respiratory: Negative for cough, hemoptysis and shortness of breath.   Cardiovascular: Negative.  Negative for chest pain.  Gastrointestinal: Negative.   Musculoskeletal: Negative.   Neurological: Negative.  Negative for weakness.  Psychiatric/Behavioral: The patient is not nervous/anxious.     As per HPI. Otherwise, a complete review of systems is negatve.  PAST MEDICAL HISTORY: Past Medical History  Diagnosis Date  . COPD (chronic obstructive pulmonary disease) ([place])   . Glaucoma   . Hypertension     PAST SURGICAL HISTORY: Past Surgical History  Procedure Laterality Date  . Peripheral vascular catheterization N/A 09/24/2015    Procedure: [name];  Surgeon: Algernon Huxley, MD;  Location: [place] [facility];  Service: Cardiovascular;  Laterality: N/A;    FAMILY HISTORY: Reviewed and unchanged. No reported history of malignancy or chronic disease.     ADVANCED DIRECTIVES:    HEALTH MAINTENANCE: Social History  Substance Use Topics  . Smoking status: Current Every Day  Smoker -- 0.50 packs/day for 50 years    Types: Cigarettes  . Smokeless tobacco: Never Used  . Alcohol Use: 0.0 oz/week    0 Standard drinks or equivalent per week     Comment: 3 pints/week     No Known Allergies  Current Outpatient Prescriptions  Medication Sig Dispense Refill  . albuterol (PROVENTIL HFA;VENTOLIN HFA) 108 (90 BASE) MCG/ACT inhaler Inhale 2 puffs into the lungs every 6 (six) hours as needed for wheezing or shortness of breath. 1 Inhaler 0  . amLODipine (NORVASC) 5 MG tablet Take 1 tablet (5 mg total) by mouth daily. 30 tablet 1  . brimonidine (ALPHAGAN) 0.2 % ophthalmic solution Place 1 drop into both eyes 2 (two) times daily.   4  . lidocaine-prilocaine (EMLA) cream Apply 1 application topically as needed. Apply to port 1 hour prior to chemotherapy appointment. Cover with plastic wrap. 30 g 2  . megestrol (MEGACE) 40 MG tablet Take 1 tablet (40 mg total) by mouth daily. 30 tablet 1  . prochlorperazine (COMPAZINE) 10 MG tablet Take 1 tablet (10 mg total) by mouth every 6 (six) hours as needed for nausea or vomiting. 30 tablet 0  . sildenafil (REVATIO) 20 MG tablet Take 1 tablet (20 mg total) by mouth daily. 5 tablet 0  . sucralfate (CARAFATE) 1 g tablet Take 1 tablet (1 g total) by mouth 4 (four) times daily -  with meals and at bedtime. 90 tablet 6  . timolol (BETIMOL) 0.5 % ophthalmic solution Place 1 drop into both eyes daily.    . timolol (TIMOPTIC) 0.5 % ophthalmic solution   4   No current facility-administered  medications for this visit.    OBJECTIVE: Filed Vitals:   11/15/15 0913  BP: 146/98  Pulse: 93  Temp: 98.1 F (36.7 C)  Resp: 18     Body mass index is 18.59 kg/(m^2).    ECOG FS:0 - Asymptomatic  General: Well-developed, well-nourished, no acute distress. Eyes: Pink conjunctiva, anicteric sclera. Lungs: Clear to auscultation bilaterally. Heart: Regular rate and rhythm. No rubs, murmurs, or gallops. Abdomen: Soft, nontender, nondistended. No  organomegaly noted, normoactive bowel sounds. Musculoskeletal: No edema, cyanosis, or clubbing. Neuro: Alert, answering all questions appropriately. Cranial nerves grossly intact. Skin: No rashes or petechiae noted. Psych: Normal affect.   LAB RESULTS:  Lab Results  Component Value Date   NA 139 11/15/2015   K 3.4* 11/15/2015   CL 107 11/15/2015   CO2 23 11/15/2015   GLUCOSE 92 11/15/2015   BUN 10 11/15/2015   CREATININE 0.54* 11/15/2015   CALCIUM 8.8* 11/15/2015   PROT 6.3* 11/15/2015   ALBUMIN 3.4* 11/15/2015   AST 20 11/15/2015   ALT 9* 11/15/2015   ALKPHOS 47 11/15/2015   BILITOT 1.3* 11/15/2015   GFRNONAA >60 11/15/2015   GFRAA >60 11/15/2015    Lab Results  Component Value Date   WBC 2.7* 11/15/2015   NEUTROABS 2.2 11/15/2015   HGB 8.2* 11/15/2015   HCT 24.2* 11/15/2015   MCV 82.8 11/15/2015   PLT 107* 11/15/2015     STUDIES: No results found.  ASSESSMENT: Stage IIIa squamous cell carcinoma of the lung.  PLAN:    1. Lung cancer:  Proceed with cycle 4 of concurrent chemotherapy with carboplatinum and Taxol today. Patient will receive his treatments weekly throughout the duration of XRT and then likely receive 2 consolidation doses. Continue with daily XRT completing on Dec 04, 2015. Return to clinic in 1 week for laboratory work and consideration of cycle 5 and then in 2 weeks for laboratory work, further evaluation, and consideration of cycle 6. 2. Anemia: Hemoglobin trending down, monitor. Secondary to chemotherapy. 3. Swallowing difficulty: Continue Carafate as needed. 4. Thrombocytopenia: Secondary to chemotherapy, proceed with treatment as above. 5. Leukopenia: Secondary chemotherapy. Monitor.  Patient expressed understanding and was in agreement with this plan. He also understands that He can call clinic at any time with any questions, concerns, or complaints.    Lloyd Huger, MD   11/19/2015 11:58 PM  Patient was seen and evaluated  independently and I agree with the assessment and plan as dictated above.  Lloyd Huger, MD 11/19/2015 11:58 PM

## 2015-11-20 ENCOUNTER — Ambulatory Visit
Admission: RE | Admit: 2015-11-20 | Discharge: 2015-11-20 | Disposition: A | Discharge status: Home / Self Care | Payer: Medicare Other | Source: Ambulatory Visit | Attending: Radiation Oncology | Admitting: Radiation Oncology

## 2015-11-20 DIAGNOSIS — C3412 Malignant neoplasm of upper lobe, left bronchus or lung: Secondary | ICD-10-CM | POA: Diagnosis not present

## 2015-11-21 ENCOUNTER — Inpatient Hospital Stay: Payer: Medicare Other

## 2015-11-21 ENCOUNTER — Ambulatory Visit
Admission: RE | Admit: 2015-11-21 | Discharge: 2015-11-21 | Disposition: A | Discharge status: Home / Self Care | Payer: Medicare Other | Source: Ambulatory Visit | Attending: Radiation Oncology | Admitting: Radiation Oncology

## 2015-11-21 DIAGNOSIS — C3412 Malignant neoplasm of upper lobe, left bronchus or lung: Secondary | ICD-10-CM | POA: Diagnosis not present

## 2015-11-22 ENCOUNTER — Other Ambulatory Visit: Payer: Self-pay | Admitting: *Deleted

## 2015-11-22 ENCOUNTER — Inpatient Hospital Stay: Payer: Medicare Other

## 2015-11-22 ENCOUNTER — Ambulatory Visit
Admission: RE | Admit: 2015-11-22 | Discharge: 2015-11-22 | Disposition: A | Discharge status: Home / Self Care | Payer: Medicare Other | Source: Ambulatory Visit | Attending: Radiation Oncology | Admitting: Radiation Oncology

## 2015-11-22 VITALS — BP 141/87 | HR 105 | Temp 97.1°F | Resp 20

## 2015-11-22 DIAGNOSIS — C801 Malignant (primary) neoplasm, unspecified: Secondary | ICD-10-CM

## 2015-11-22 DIAGNOSIS — Z5111 Encounter for antineoplastic chemotherapy: Secondary | ICD-10-CM | POA: Diagnosis not present

## 2015-11-22 DIAGNOSIS — E876 Hypokalemia: Secondary | ICD-10-CM

## 2015-11-22 DIAGNOSIS — C3412 Malignant neoplasm of upper lobe, left bronchus or lung: Secondary | ICD-10-CM | POA: Diagnosis not present

## 2015-11-22 DIAGNOSIS — C3432 Malignant neoplasm of lower lobe, left bronchus or lung: Secondary | ICD-10-CM

## 2015-11-22 LAB — CBC WITH DIFFERENTIAL/PLATELET
Basophils Absolute: 0 10*3/uL (ref 0–0.1)
EOS ABS: 0 10*3/uL (ref 0–0.7)
HCT: 22.8 % — ABNORMAL LOW (ref 40.0–52.0)
Hemoglobin: 7.6 g/dL — ABNORMAL LOW (ref 13.0–18.0)
Lymphocytes Relative: 39 %
Lymphs Abs: 0.3 10*3/uL — ABNORMAL LOW (ref 1.0–3.6)
MCH: 27.4 pg (ref 26.0–34.0)
MCHC: 33.4 g/dL (ref 32.0–36.0)
MCV: 81.9 fL (ref 80.0–100.0)
Monocytes Absolute: 0.1 10*3/uL — ABNORMAL LOW (ref 0.2–1.0)
Neutro Abs: 0.4 10*3/uL — ABNORMAL LOW (ref 1.4–6.5)
Neutrophils Relative %: 48 %
PLATELETS: 100 10*3/uL — AB (ref 150–440)
RBC: 2.79 MIL/uL — AB (ref 4.40–5.90)
RDW: 14.9 % — ABNORMAL HIGH (ref 11.5–14.5)
WBC: 0.9 10*3/uL — AB (ref 3.8–10.6)

## 2015-11-22 LAB — COMPREHENSIVE METABOLIC PANEL
ALBUMIN: 3.6 g/dL (ref 3.5–5.0)
ALT: 8 U/L — ABNORMAL LOW (ref 17–63)
AST: 16 U/L (ref 15–41)
Alkaline Phosphatase: 53 U/L (ref 38–126)
Anion gap: 13 (ref 5–15)
BUN: 10 mg/dL (ref 6–20)
CHLORIDE: 103 mmol/L (ref 101–111)
CO2: 24 mmol/L (ref 22–32)
CREATININE: 0.54 mg/dL — AB (ref 0.61–1.24)
Calcium: 9.2 mg/dL (ref 8.9–10.3)
GFR calc non Af Amer: >60 mL/min (ref >60)
Glucose, Bld: 83 mg/dL (ref 65–99)
POTASSIUM: 3 mmol/L — AB (ref 3.5–5.1)
SODIUM: 140 mmol/L (ref 135–145)
Total Bilirubin: 0.7 mg/dL (ref 0.3–1.2)
Total Protein: 6.8 g/dL (ref 6.5–8.1)

## 2015-11-22 MED ORDER — SODIUM CHLORIDE 0.9 % IV SOLN: 40.0000 meq | Freq: Once | INTRAVENOUS | Status: DC

## 2015-11-22 MED ORDER — HEPARIN SOD (PORK) LOCK FLUSH 100 UNIT/ML IV SOLN
INTRAVENOUS | Status: AC
  Filled 2015-11-22: qty 5

## 2015-11-22 MED ORDER — SODIUM CHLORIDE 0.9 % IV SOLN
Freq: Once | INTRAVENOUS | Status: AC
  Administered 2015-11-22: 12:00:00 via INTRAVENOUS
  Filled 2015-11-22: qty 1000

## 2015-11-22 MED ORDER — SODIUM CHLORIDE 0.9% FLUSH
10.0000 mL | Freq: Once | INTRAVENOUS | Status: AC
Start: 2015-11-22 — End: 2015-11-22
  Administered 2015-11-22: 10 mL via INTRAVENOUS
  Filled 2015-11-22: qty 10

## 2015-11-22 MED ORDER — SODIUM CHLORIDE 0.9% FLUSH
10.0000 mL | Freq: Once | INTRAVENOUS | Status: AC
  Administered 2015-11-22: 10 mL via INTRAVENOUS
  Filled 2015-11-22: qty 10

## 2015-11-22 MED ORDER — SODIUM CHLORIDE 0.9 % IV SOLN
Freq: Once | INTRAVENOUS | Status: AC
  Administered 2015-11-22: 13:00:00 via INTRAVENOUS
  Filled 2015-11-22: qty 250

## 2015-11-22 MED ORDER — HEPARIN SOD (PORK) LOCK FLUSH 100 UNIT/ML IV SOLN
500.0000 [IU] | Freq: Once | INTRAVENOUS | Status: AC
  Administered 2015-11-22: 500 [IU] via INTRAVENOUS

## 2015-11-23 ENCOUNTER — Inpatient Hospital Stay: Payer: Medicare Other

## 2015-11-23 ENCOUNTER — Ambulatory Visit
Admission: RE | Admit: 2015-11-23 | Discharge: 2015-11-23 | Disposition: A | Discharge status: Home / Self Care | Payer: Medicare Other | Source: Ambulatory Visit | Attending: Radiation Oncology | Admitting: Radiation Oncology

## 2015-11-23 DIAGNOSIS — C3412 Malignant neoplasm of upper lobe, left bronchus or lung: Secondary | ICD-10-CM | POA: Diagnosis not present

## 2015-11-26 ENCOUNTER — Ambulatory Visit
Admission: RE | Admit: 2015-11-26 | Discharge: 2015-11-26 | Disposition: A | Discharge status: Home / Self Care | Payer: Medicare Other | Source: Ambulatory Visit | Attending: Radiation Oncology | Admitting: Radiation Oncology

## 2015-11-26 ENCOUNTER — Inpatient Hospital Stay: Payer: Medicare Other

## 2015-11-26 DIAGNOSIS — C3412 Malignant neoplasm of upper lobe, left bronchus or lung: Secondary | ICD-10-CM | POA: Diagnosis not present

## 2015-11-27 ENCOUNTER — Ambulatory Visit
Admission: RE | Admit: 2015-11-27 | Discharge: 2015-11-27 | Disposition: A | Discharge status: Home / Self Care | Payer: Medicare Other | Source: Ambulatory Visit | Attending: Radiation Oncology | Admitting: Radiation Oncology

## 2015-11-27 ENCOUNTER — Inpatient Hospital Stay: Payer: Medicare Other

## 2015-11-27 ENCOUNTER — Other Ambulatory Visit: Payer: Self-pay | Admitting: *Deleted

## 2015-11-27 DIAGNOSIS — C3412 Malignant neoplasm of upper lobe, left bronchus or lung: Secondary | ICD-10-CM

## 2015-11-27 MED ORDER — HYDROCORTISONE 2.5 % RE CREA: 1.0000 "application " | TOPICAL_CREAM | Freq: Two times a day (BID) | RECTAL | Status: DC

## 2015-11-28 ENCOUNTER — Inpatient Hospital Stay: Payer: Medicare Other

## 2015-11-28 ENCOUNTER — Ambulatory Visit
Admission: RE | Admit: 2015-11-28 | Discharge: 2015-11-28 | Disposition: A | Discharge status: Home / Self Care | Payer: Medicare Other | Source: Ambulatory Visit | Attending: Radiation Oncology | Admitting: Radiation Oncology

## 2015-11-28 DIAGNOSIS — C3412 Malignant neoplasm of upper lobe, left bronchus or lung: Secondary | ICD-10-CM | POA: Diagnosis not present

## 2015-11-29 ENCOUNTER — Ambulatory Visit
Admission: RE | Admit: 2015-11-29 | Discharge: 2015-11-29 | Disposition: A | Discharge status: Home / Self Care | Payer: Medicare Other | Source: Ambulatory Visit | Attending: Radiation Oncology | Admitting: Radiation Oncology

## 2015-11-29 ENCOUNTER — Inpatient Hospital Stay: Payer: Medicare Other

## 2015-11-29 ENCOUNTER — Inpatient Hospital Stay (HOSPITAL_BASED_OUTPATIENT_CLINIC_OR_DEPARTMENT_OTHER): Payer: Medicare Other | Admitting: Oncology

## 2015-11-29 VITALS — BP 148/97 | HR 103 | Temp 98.6°F | Resp 18 | Wt 135.6 lb

## 2015-11-29 DIAGNOSIS — R1310 Dysphagia, unspecified: Secondary | ICD-10-CM | POA: Diagnosis not present

## 2015-11-29 DIAGNOSIS — Z5111 Encounter for antineoplastic chemotherapy: Secondary | ICD-10-CM | POA: Diagnosis not present

## 2015-11-29 DIAGNOSIS — C3490 Malignant neoplasm of unspecified part of unspecified bronchus or lung: Secondary | ICD-10-CM

## 2015-11-29 DIAGNOSIS — D649 Anemia, unspecified: Secondary | ICD-10-CM

## 2015-11-29 DIAGNOSIS — C3412 Malignant neoplasm of upper lobe, left bronchus or lung: Secondary | ICD-10-CM

## 2015-11-29 DIAGNOSIS — Z79899 Other long term (current) drug therapy: Secondary | ICD-10-CM

## 2015-11-29 DIAGNOSIS — C3432 Malignant neoplasm of lower lobe, left bronchus or lung: Secondary | ICD-10-CM

## 2015-11-29 DIAGNOSIS — F17210 Nicotine dependence, cigarettes, uncomplicated: Secondary | ICD-10-CM | POA: Diagnosis not present

## 2015-11-29 DIAGNOSIS — E876 Hypokalemia: Secondary | ICD-10-CM

## 2015-11-29 LAB — COMPREHENSIVE METABOLIC PANEL WITH GFR
ALT: 8 U/L — ABNORMAL LOW (ref 17–63)
AST: 16 U/L (ref 15–41)
Albumin: 3.3 g/dL — ABNORMAL LOW (ref 3.5–5.0)
Alkaline Phosphatase: 57 U/L (ref 38–126)
Anion gap: 9 (ref 5–15)
BUN: 10 mg/dL (ref 6–20)
CO2: 23 mmol/L (ref 22–32)
Calcium: 8.6 mg/dL — ABNORMAL LOW (ref 8.9–10.3)
Chloride: 108 mmol/L (ref 101–111)
Creatinine, Ser: 0.59 mg/dL — ABNORMAL LOW (ref 0.61–1.24)
GFR calc Af Amer: >60 mL/min
GFR calc non Af Amer: >60 mL/min
Glucose, Bld: 84 mg/dL (ref 65–99)
Potassium: 2.9 mmol/L — CL (ref 3.5–5.1)
Sodium: 140 mmol/L (ref 135–145)
Total Bilirubin: 0.5 mg/dL (ref 0.3–1.2)
Total Protein: 6.5 g/dL (ref 6.5–8.1)

## 2015-11-29 LAB — CBC WITH DIFFERENTIAL/PLATELET
Basophils Absolute: 0 10*3/uL (ref 0–0.1)
Basophils Relative: 1 %
Eosinophils Absolute: 0 10*3/uL (ref 0–0.7)
Eosinophils Relative: 1 %
HCT: 23.2 % — ABNORMAL LOW (ref 40.0–52.0)
Hemoglobin: 7.8 g/dL — ABNORMAL LOW (ref 13.0–18.0)
Lymphocytes Relative: 13 %
Lymphs Abs: 0.5 10*3/uL — ABNORMAL LOW (ref 1.0–3.6)
MCH: 28.1 pg (ref 26.0–34.0)
MCHC: 33.5 g/dL (ref 32.0–36.0)
MCV: 84 fL (ref 80.0–100.0)
Monocytes Absolute: 0.7 10*3/uL (ref 0.2–1.0)
Monocytes Relative: 17 %
Neutro Abs: 2.8 10*3/uL (ref 1.4–6.5)
Neutrophils Relative %: 68 %
Platelets: 166 10*3/uL (ref 150–440)
RBC: 2.77 MIL/uL — ABNORMAL LOW (ref 4.40–5.90)
RDW: 17.5 % — ABNORMAL HIGH (ref 11.5–14.5)
WBC: 4 10*3/uL (ref 3.8–10.6)

## 2015-11-29 MED ORDER — SODIUM CHLORIDE 0.9 % IV SOLN
Freq: Once | INTRAVENOUS | Status: AC
  Administered 2015-11-29: 11:00:00 via INTRAVENOUS
  Filled 2015-11-29: qty 250

## 2015-11-29 MED ORDER — SODIUM CHLORIDE 0.9% FLUSH
10.0000 mL | INTRAVENOUS | Status: DC | PRN
  Administered 2015-11-29: 10 mL
  Filled 2015-11-29: qty 10

## 2015-11-29 MED ORDER — HEPARIN SOD (PORK) LOCK FLUSH 100 UNIT/ML IV SOLN
500.0000 [IU] | Freq: Once | INTRAVENOUS | Status: DC | PRN
  Filled 2015-11-29: qty 5

## 2015-11-29 MED ORDER — CARBOPLATIN CHEMO INJECTION 450 MG/45ML
187.0000 mg | Freq: Once | INTRAVENOUS | Status: AC
  Administered 2015-11-29: 190 mg via INTRAVENOUS
  Filled 2015-11-29: qty 19

## 2015-11-29 MED ORDER — POTASSIUM CHLORIDE CRYS ER 20 MEQ PO TBCR: 20.0000 meq | EXTENDED_RELEASE_TABLET | Freq: Every day | ORAL | Status: DC

## 2015-11-29 MED ORDER — PALONOSETRON HCL INJECTION 0.25 MG/5ML
0.2500 mg | Freq: Once | INTRAVENOUS | Status: AC
  Administered 2015-11-29: 0.25 mg via INTRAVENOUS
  Filled 2015-11-29: qty 5

## 2015-11-29 MED ORDER — SODIUM CHLORIDE 0.9 % IV SOLN
Freq: Once | INTRAVENOUS | Status: AC
  Administered 2015-11-29: 11:00:00 via INTRAVENOUS
  Filled 2015-11-29: qty 1000

## 2015-11-29 MED ORDER — SODIUM CHLORIDE 0.9 % IV SOLN: 40.0000 meq | Freq: Once | INTRAVENOUS | Status: DC

## 2015-11-29 MED ORDER — FAMOTIDINE IN NACL 20-0.9 MG/50ML-% IV SOLN
20.0000 mg | Freq: Once | INTRAVENOUS | Status: AC
  Administered 2015-11-29: 20 mg via INTRAVENOUS
  Filled 2015-11-29: qty 50

## 2015-11-29 MED ORDER — DIPHENHYDRAMINE HCL 50 MG/ML IJ SOLN
25.0000 mg | Freq: Once | INTRAMUSCULAR | Status: AC
  Administered 2015-11-29: 25 mg via INTRAVENOUS
  Filled 2015-11-29: qty 1

## 2015-11-29 MED ORDER — PACLITAXEL CHEMO INJECTION 300 MG/50ML
80.0000 mg/m2 | Freq: Once | INTRAVENOUS | Status: AC
  Administered 2015-11-29: 150 mg via INTRAVENOUS
  Filled 2015-11-29: qty 25

## 2015-11-29 MED ORDER — SODIUM CHLORIDE 0.9 % IV SOLN
10.0000 mg | Freq: Once | INTRAVENOUS | Status: AC
  Administered 2015-11-29: 10 mg via INTRAVENOUS
  Filled 2015-11-29: qty 1

## 2015-11-29 NOTE — Progress Notes (Signed)
Patient does not have much of an appetite.  Has a productive cough with SOBr on exertion.  Blood pressure is elevated today at 148/98.

## 2015-11-29 NOTE — Progress Notes (Signed)
Hbg 7.8. Per Dr Grayland Ormond proceed with treatment.

## 2015-11-30 ENCOUNTER — Ambulatory Visit
Admission: RE | Admit: 2015-11-30 | Discharge: 2015-11-30 | Disposition: A | Discharge status: Home / Self Care | Payer: Medicare Other | Source: Ambulatory Visit | Attending: Radiation Oncology | Admitting: Radiation Oncology

## 2015-11-30 ENCOUNTER — Inpatient Hospital Stay: Payer: Medicare Other

## 2015-11-30 DIAGNOSIS — C3412 Malignant neoplasm of upper lobe, left bronchus or lung: Secondary | ICD-10-CM | POA: Diagnosis not present

## 2015-12-03 ENCOUNTER — Inpatient Hospital Stay: Payer: Medicare Other

## 2015-12-03 ENCOUNTER — Ambulatory Visit
Admission: RE | Admit: 2015-12-03 | Discharge: 2015-12-03 | Disposition: A | Discharge status: Home / Self Care | Payer: Medicare Other | Source: Ambulatory Visit | Attending: Radiation Oncology | Admitting: Radiation Oncology

## 2015-12-03 DIAGNOSIS — C3412 Malignant neoplasm of upper lobe, left bronchus or lung: Secondary | ICD-10-CM | POA: Diagnosis not present

## 2015-12-03 NOTE — Progress Notes (Signed)
[place]  [phone]) [phone] Fax:(336) [phone]  ID: Carl Bell OB: 1951/05/02  MR#: [id number]  GYI#:[id number]  Patient Care Team: Kirk Ruths, MD as PCP - General (Internal Medicine)  CHIEF COMPLAINT:  No chief complaint on file.   INTERVAL HISTORY: Patient returns to clinic today for further evaluation and consideration of cycle 5 of weekly carboplatinum and Taxol. He complains of a decreased appetite. He also has a persistent cough with occasional dyspnea on exertion. He otherwise feels well and is asymptomatic.  He has no neurologic complaints. He denies any recent fevers. He denies chest pain. He denies any hemoptysis.  He denies any nausea, vomiting, constipation, or diarrhea. He has no urinary complaints. Patient offers no further specific complaints  REVIEW OF SYSTEMS:   Review of Systems  Constitutional: Negative for fever, weight loss and malaise/fatigue.  Respiratory: Positive for cough and shortness of breath. Negative for hemoptysis.   Cardiovascular: Negative.  Negative for chest pain.  Gastrointestinal: Negative.   Musculoskeletal: Negative.   Neurological: Negative.  Negative for weakness.  Psychiatric/Behavioral: The patient is not nervous/anxious.     As per HPI. Otherwise, a complete review of systems is negatve.  PAST MEDICAL HISTORY: Past Medical History  Diagnosis Date  . COPD (chronic obstructive pulmonary disease) ([place])   . Glaucoma   . Hypertension     PAST SURGICAL HISTORY: Past Surgical History  Procedure Laterality Date  . Peripheral vascular catheterization N/A 09/24/2015    Procedure: [name];  Surgeon: Algernon Huxley, MD;  Location: [facility];  Service: Cardiovascular;  Laterality: N/A;    FAMILY HISTORY: Reviewed and unchanged. No reported history of malignancy or chronic disease.     ADVANCED DIRECTIVES:    HEALTH MAINTENANCE: Social History  Substance Use Topics  . Smoking  status: Current Every Day Smoker -- 0.50 packs/day for 50 years    Types: Cigarettes  . Smokeless tobacco: Never Used  . Alcohol Use: 0.0 oz/week    0 Standard drinks or equivalent per week     Comment: 3 pints/week     No Known Allergies  Current Outpatient Prescriptions  Medication Sig Dispense Refill  . albuterol (PROVENTIL HFA;VENTOLIN HFA) 108 (90 BASE) MCG/ACT inhaler Inhale 2 puffs into the lungs every 6 (six) hours as needed for wheezing or shortness of breath. 1 Inhaler 0  . amLODipine (NORVASC) 5 MG tablet Take 1 tablet (5 mg total) by mouth daily. 30 tablet 1  . brimonidine (ALPHAGAN) 0.2 % ophthalmic solution Place 1 drop into both eyes 2 (two) times daily.   4  . hydrocortisone (ANUSOL-HC) 2.5 % rectal cream Place 1 application rectally 2 (two) times daily. 30 g 2  . lidocaine-prilocaine (EMLA) cream Apply 1 application topically as needed. Apply to port 1 hour prior to chemotherapy appointment. Cover with plastic wrap. 30 g 2  . megestrol (MEGACE) 40 MG tablet Take 1 tablet (40 mg total) by mouth daily. 30 tablet 1  . prochlorperazine (COMPAZINE) 10 MG tablet Take 1 tablet (10 mg total) by mouth every 6 (six) hours as needed for nausea or vomiting. 30 tablet 0  . sildenafil (REVATIO) 20 MG tablet Take 1 tablet (20 mg total) by mouth daily. 5 tablet 0  . sucralfate (CARAFATE) 1 g tablet Take 1 tablet (1 g total) by mouth 4 (four) times daily -  with meals and at bedtime. 90 tablet 6  . timolol (BETIMOL) 0.5 % ophthalmic solution Place 1 drop into  both eyes daily.    . timolol (TIMOPTIC) 0.5 % ophthalmic solution   4  . potassium chloride SA (K-DUR,KLOR-CON) 20 MEQ tablet Take 1 tablet (20 mEq total) by mouth daily. 30 tablet 1   No current facility-administered medications for this visit.    OBJECTIVE: Filed Vitals:   11/29/15 0933  BP: 148/97  Pulse: 103  Temp: 98.6 F (37 C)  Resp: 18     Body mass index is 17.89 kg/(m^2).    ECOG FS:0 - Asymptomatic  General:  Well-developed, well-nourished, no acute distress. Eyes: Pink conjunctiva, anicteric sclera. Lungs: Clear to auscultation bilaterally. Heart: Regular rate and rhythm. No rubs, murmurs, or gallops. Abdomen: Soft, nontender, nondistended. No organomegaly noted, normoactive bowel sounds. Musculoskeletal: No edema, cyanosis, or clubbing. Neuro: Alert, answering all questions appropriately. Cranial nerves grossly intact. Skin: No rashes or petechiae noted. Psych: Normal affect.   LAB RESULTS:  Lab Results  Component Value Date   NA 140 11/29/2015   K 2.9* 11/29/2015   CL 108 11/29/2015   CO2 23 11/29/2015   GLUCOSE 84 11/29/2015   BUN 10 11/29/2015   CREATININE 0.59* 11/29/2015   CALCIUM 8.6* 11/29/2015   PROT 6.5 11/29/2015   ALBUMIN 3.3* 11/29/2015   AST 16 11/29/2015   ALT 8* 11/29/2015   ALKPHOS 57 11/29/2015   BILITOT 0.5 11/29/2015   GFRNONAA >60 11/29/2015   GFRAA >60 11/29/2015    Lab Results  Component Value Date   WBC 4.0 11/29/2015   NEUTROABS 2.8 11/29/2015   HGB 7.8* 11/29/2015   HCT 23.2* 11/29/2015   MCV 84.0 11/29/2015   PLT 166 11/29/2015     STUDIES: No results found.  ASSESSMENT: Stage IIIa squamous cell carcinoma of the lung.  PLAN:    1. Lung cancer:  Treatment was held last week given neutropenia. Proceed with cycle 5 of concurrent chemotherapy with carboplatinum and Taxol today. Patient will receive his treatments weekly throughout the duration of XRT and then likely receive 2 consolidation doses. Continue with daily XRT completing on Dec 04, 2015. Return to clinic in 3 weeks for laboratory work and consideration of cycle 1 of 2 of consolidation chemotherapy. 2. Anemia: Hemoglobin trending down, monitor. Secondary to chemotherapy. 3. Swallowing difficulty: Continue Carafate as needed. 4. Thrombocytopenia: Resolved.   5. Leukopenia: Resolved.  Patient expressed understanding and was in agreement with this plan. He also understands that He can  call clinic at any time with any questions, concerns, or complaints.    Lloyd Huger, MD   12/03/2015 1:41 PM

## 2015-12-04 ENCOUNTER — Ambulatory Visit
Admission: RE | Admit: 2015-12-04 | Discharge: 2015-12-04 | Disposition: A | Discharge status: Home / Self Care | Payer: Medicare Other | Source: Ambulatory Visit | Attending: Radiation Oncology | Admitting: Radiation Oncology

## 2015-12-04 DIAGNOSIS — C3412 Malignant neoplasm of upper lobe, left bronchus or lung: Secondary | ICD-10-CM | POA: Diagnosis not present

## 2015-12-20 ENCOUNTER — Other Ambulatory Visit: Payer: Self-pay | Admitting: *Deleted

## 2015-12-20 ENCOUNTER — Inpatient Hospital Stay: Payer: Medicare Other | Attending: Oncology

## 2015-12-20 ENCOUNTER — Inpatient Hospital Stay: Payer: Medicare Other

## 2015-12-20 ENCOUNTER — Inpatient Hospital Stay (HOSPITAL_BASED_OUTPATIENT_CLINIC_OR_DEPARTMENT_OTHER): Payer: Medicare Other | Admitting: Oncology

## 2015-12-20 VITALS — BP 116/78 | HR 100 | Temp 96.8°F | Resp 18

## 2015-12-20 VITALS — BP 121/78 | HR 101 | Temp 98.7°F | Resp 18 | Wt 133.8 lb

## 2015-12-20 DIAGNOSIS — I10 Essential (primary) hypertension: Secondary | ICD-10-CM | POA: Diagnosis not present

## 2015-12-20 DIAGNOSIS — R05 Cough: Secondary | ICD-10-CM | POA: Insufficient documentation

## 2015-12-20 DIAGNOSIS — H409 Unspecified glaucoma: Secondary | ICD-10-CM | POA: Diagnosis not present

## 2015-12-20 DIAGNOSIS — R531 Weakness: Secondary | ICD-10-CM | POA: Diagnosis not present

## 2015-12-20 DIAGNOSIS — D6481 Anemia due to antineoplastic chemotherapy: Secondary | ICD-10-CM | POA: Insufficient documentation

## 2015-12-20 DIAGNOSIS — J449 Chronic obstructive pulmonary disease, unspecified: Secondary | ICD-10-CM | POA: Diagnosis not present

## 2015-12-20 DIAGNOSIS — R0602 Shortness of breath: Secondary | ICD-10-CM

## 2015-12-20 DIAGNOSIS — Z5111 Encounter for antineoplastic chemotherapy: Secondary | ICD-10-CM | POA: Diagnosis not present

## 2015-12-20 DIAGNOSIS — C3412 Malignant neoplasm of upper lobe, left bronchus or lung: Secondary | ICD-10-CM | POA: Diagnosis not present

## 2015-12-20 DIAGNOSIS — R1310 Dysphagia, unspecified: Secondary | ICD-10-CM | POA: Diagnosis not present

## 2015-12-20 DIAGNOSIS — Z79899 Other long term (current) drug therapy: Secondary | ICD-10-CM

## 2015-12-20 DIAGNOSIS — E876 Hypokalemia: Secondary | ICD-10-CM

## 2015-12-20 DIAGNOSIS — Z923 Personal history of irradiation: Secondary | ICD-10-CM | POA: Diagnosis not present

## 2015-12-20 DIAGNOSIS — D696 Thrombocytopenia, unspecified: Secondary | ICD-10-CM | POA: Diagnosis not present

## 2015-12-20 DIAGNOSIS — Z7689 Persons encountering health services in other specified circumstances: Secondary | ICD-10-CM | POA: Insufficient documentation

## 2015-12-20 DIAGNOSIS — F17210 Nicotine dependence, cigarettes, uncomplicated: Secondary | ICD-10-CM | POA: Insufficient documentation

## 2015-12-20 DIAGNOSIS — Z9221 Personal history of antineoplastic chemotherapy: Secondary | ICD-10-CM

## 2015-12-20 LAB — CBC WITH DIFFERENTIAL/PLATELET
BASOS ABS: 0 10*3/uL (ref 0–0.1)
BASOS PCT: 1 %
EOS ABS: 0.1 10*3/uL (ref 0–0.7)
EOS PCT: 4 %
HCT: 21.7 % — ABNORMAL LOW (ref 40.0–52.0)
HEMOGLOBIN: 7.3 g/dL — AB (ref 13.0–18.0)
LYMPHS ABS: 0.5 10*3/uL — AB (ref 1.0–3.6)
Lymphocytes Relative: 15 %
MCH: 29.1 pg (ref 26.0–34.0)
MCHC: 33.5 g/dL (ref 32.0–36.0)
MCV: 86.7 fL (ref 80.0–100.0)
Monocytes Absolute: 0.5 10*3/uL (ref 0.2–1.0)
Monocytes Relative: 15 %
NEUTROS PCT: 65 %
Neutro Abs: 2.3 10*3/uL (ref 1.4–6.5)
PLATELETS: 156 10*3/uL (ref 150–440)
RBC: 2.5 MIL/uL — AB (ref 4.40–5.90)
RDW: 21.7 % — ABNORMAL HIGH (ref 11.5–14.5)
WBC: 3.5 10*3/uL — AB (ref 3.8–10.6)

## 2015-12-20 LAB — SAMPLE TO BLOOD BANK

## 2015-12-20 LAB — COMPREHENSIVE METABOLIC PANEL
ALBUMIN: 3.2 g/dL — AB (ref 3.5–5.0)
ALK PHOS: 54 U/L (ref 38–126)
ALT: 8 U/L — AB (ref 17–63)
AST: 18 U/L (ref 15–41)
Anion gap: 8 (ref 5–15)
BUN: 9 mg/dL (ref 6–20)
CALCIUM: 8.5 mg/dL — AB (ref 8.9–10.3)
CHLORIDE: 106 mmol/L (ref 101–111)
CO2: 24 mmol/L (ref 22–32)
CREATININE: 0.66 mg/dL (ref 0.61–1.24)
GFR calc non Af Amer: >60 mL/min (ref >60)
GLUCOSE: 96 mg/dL (ref 65–99)
Potassium: 2.8 mmol/L — CL (ref 3.5–5.1)
SODIUM: 138 mmol/L (ref 135–145)
Total Bilirubin: 0.8 mg/dL (ref 0.3–1.2)
Total Protein: 6.3 g/dL — ABNORMAL LOW (ref 6.5–8.1)

## 2015-12-20 LAB — ABO/RH: ABO/RH(D): O POS

## 2015-12-20 LAB — PREPARE RBC (CROSSMATCH)

## 2015-12-20 MED ORDER — SODIUM CHLORIDE 0.9% FLUSH
10.0000 mL | Freq: Once | INTRAVENOUS | Status: AC
Start: 2015-12-20 — End: 2015-12-20
  Administered 2015-12-20: 10 mL via INTRAVENOUS
  Filled 2015-12-20: qty 10

## 2015-12-20 MED ORDER — HEPARIN SOD (PORK) LOCK FLUSH 100 UNIT/ML IV SOLN
500.0000 [IU] | Freq: Once | INTRAVENOUS | Status: AC
  Administered 2015-12-20: 500 [IU] via INTRAVENOUS
  Filled 2015-12-20: qty 5

## 2015-12-20 MED ORDER — SODIUM CHLORIDE 0.9 % IV SOLN
250.0000 mL | Freq: Once | INTRAVENOUS | Status: AC
  Administered 2015-12-20: 250 mL via INTRAVENOUS
  Filled 2015-12-20: qty 250

## 2015-12-20 MED ORDER — DIPHENHYDRAMINE HCL 50 MG/ML IJ SOLN
25.0000 mg | Freq: Once | INTRAMUSCULAR | Status: AC
  Administered 2015-12-20: 25 mg via INTRAVENOUS
  Filled 2015-12-20: qty 1

## 2015-12-20 MED ORDER — POTASSIUM CHLORIDE 2 MEQ/ML IV SOLN: 40.0000 meq | Freq: Once | INTRAVENOUS | Status: DC

## 2015-12-20 MED ORDER — SODIUM CHLORIDE 0.9 % IV SOLN
40.0000 meq | Freq: Once | INTRAVENOUS | Status: AC
  Administered 2015-12-20: 40 meq via INTRAVENOUS
  Filled 2015-12-20: qty 20

## 2015-12-20 MED ORDER — ACETAMINOPHEN 325 MG PO TABS
650.0000 mg | ORAL_TABLET | Freq: Once | ORAL | Status: AC
  Administered 2015-12-20: 650 mg via ORAL
  Filled 2015-12-20: qty 2

## 2015-12-20 NOTE — Progress Notes (Signed)
Patient has a loss of appetite, weakness, and SOBr.  Is not sure if he is taking Megace so he will check when he gets home.

## 2015-12-21 LAB — TYPE AND SCREEN
ABO/RH(D): O POS
ANTIBODY SCREEN: NEGATIVE
Unit division: 0

## 2015-12-22 ENCOUNTER — Observation Stay
Admission: EM | Admit: 2015-12-22 | Discharge: 2015-12-23 | Disposition: A | Discharge status: Home / Self Care | Payer: Medicare Other | Attending: Internal Medicine | Admitting: Internal Medicine

## 2015-12-22 ENCOUNTER — Emergency Department: Payer: Medicare Other

## 2015-12-22 ENCOUNTER — Other Ambulatory Visit: Payer: Self-pay

## 2015-12-22 ENCOUNTER — Encounter: Payer: Self-pay | Admitting: Emergency Medicine

## 2015-12-22 DIAGNOSIS — E876 Hypokalemia: Secondary | ICD-10-CM | POA: Insufficient documentation

## 2015-12-22 DIAGNOSIS — H409 Unspecified glaucoma: Secondary | ICD-10-CM | POA: Diagnosis not present

## 2015-12-22 DIAGNOSIS — Z8249 Family history of ischemic heart disease and other diseases of the circulatory system: Secondary | ICD-10-CM | POA: Insufficient documentation

## 2015-12-22 DIAGNOSIS — J441 Chronic obstructive pulmonary disease with (acute) exacerbation: Secondary | ICD-10-CM | POA: Diagnosis present

## 2015-12-22 DIAGNOSIS — Z79899 Other long term (current) drug therapy: Secondary | ICD-10-CM | POA: Insufficient documentation

## 2015-12-22 DIAGNOSIS — R0602 Shortness of breath: Secondary | ICD-10-CM | POA: Diagnosis present

## 2015-12-22 DIAGNOSIS — Z833 Family history of diabetes mellitus: Secondary | ICD-10-CM | POA: Insufficient documentation

## 2015-12-22 DIAGNOSIS — Z9889 Other specified postprocedural states: Secondary | ICD-10-CM | POA: Diagnosis not present

## 2015-12-22 DIAGNOSIS — F17210 Nicotine dependence, cigarettes, uncomplicated: Secondary | ICD-10-CM | POA: Diagnosis not present

## 2015-12-22 DIAGNOSIS — I10 Essential (primary) hypertension: Secondary | ICD-10-CM | POA: Diagnosis not present

## 2015-12-22 DIAGNOSIS — C3412 Malignant neoplasm of upper lobe, left bronchus or lung: Secondary | ICD-10-CM | POA: Insufficient documentation

## 2015-12-22 DIAGNOSIS — J440 Chronic obstructive pulmonary disease with acute lower respiratory infection: Principal | ICD-10-CM | POA: Insufficient documentation

## 2015-12-22 DIAGNOSIS — J209 Acute bronchitis, unspecified: Secondary | ICD-10-CM

## 2015-12-22 DIAGNOSIS — R0603 Acute respiratory distress: Secondary | ICD-10-CM

## 2015-12-22 DIAGNOSIS — R0902 Hypoxemia: Secondary | ICD-10-CM | POA: Diagnosis not present

## 2015-12-22 DIAGNOSIS — J449 Chronic obstructive pulmonary disease, unspecified: Secondary | ICD-10-CM

## 2015-12-22 LAB — CBC
HEMATOCRIT: 28.2 % — AB (ref 40.0–52.0)
HEMOGLOBIN: 9.2 g/dL — AB (ref 13.0–18.0)
MCH: 28.8 pg (ref 26.0–34.0)
MCHC: 32.8 g/dL (ref 32.0–36.0)
MCV: 87.7 fL (ref 80.0–100.0)
Platelets: 159 10*3/uL (ref 150–440)
RBC: 3.21 MIL/uL — ABNORMAL LOW (ref 4.40–5.90)
RDW: 20.8 % — AB (ref 11.5–14.5)
WBC: 7.4 10*3/uL (ref 3.8–10.6)

## 2015-12-22 LAB — LACTIC ACID, PLASMA
LACTIC ACID, VENOUS: 1.6 mmol/L (ref 0.5–2.0)
Lactic Acid, Venous: 1.2 mmol/L (ref 0.5–2.0)

## 2015-12-22 LAB — COMPREHENSIVE METABOLIC PANEL
ALK PHOS: 54 U/L (ref 38–126)
ALT: 9 U/L — AB (ref 17–63)
AST: 16 U/L (ref 15–41)
Albumin: 3.4 g/dL — ABNORMAL LOW (ref 3.5–5.0)
Anion gap: 10 (ref 5–15)
BILIRUBIN TOTAL: 1.4 mg/dL — AB (ref 0.3–1.2)
BUN: 8 mg/dL (ref 6–20)
CALCIUM: 9 mg/dL (ref 8.9–10.3)
CO2: 23 mmol/L (ref 22–32)
CREATININE: 0.5 mg/dL — AB (ref 0.61–1.24)
Chloride: 104 mmol/L (ref 101–111)
Glucose, Bld: 102 mg/dL — ABNORMAL HIGH (ref 65–99)
Potassium: 3.3 mmol/L — ABNORMAL LOW (ref 3.5–5.1)
Sodium: 137 mmol/L (ref 135–145)
TOTAL PROTEIN: 6.7 g/dL (ref 6.5–8.1)

## 2015-12-22 LAB — TROPONIN I: Troponin I: <0.03 ng/mL (ref <0.031)

## 2015-12-22 MED ORDER — SUCRALFATE 1 G PO TABS
1.0000 g | ORAL_TABLET | Freq: Three times a day (TID) | ORAL | Status: DC
  Administered 2015-12-22 – 2015-12-23 (×3): 1 g via ORAL
  Filled 2015-12-22 (×3): qty 1

## 2015-12-22 MED ORDER — MOMETASONE FURO-FORMOTEROL FUM 200-5 MCG/ACT IN AERO
2.0000 | INHALATION_SPRAY | Freq: Two times a day (BID) | RESPIRATORY_TRACT | Status: DC
  Administered 2015-12-22 – 2015-12-23 (×2): 2 via RESPIRATORY_TRACT
  Filled 2015-12-22: qty 8.8

## 2015-12-22 MED ORDER — AZITHROMYCIN 500 MG IV SOLR
500.0000 mg | INTRAVENOUS | Status: DC
  Filled 2015-12-22: qty 500

## 2015-12-22 MED ORDER — HEPARIN SODIUM (PORCINE) 5000 UNIT/ML IJ SOLN
5000.0000 [IU] | Freq: Three times a day (TID) | INTRAMUSCULAR | Status: DC
  Administered 2015-12-22 – 2015-12-23 (×3): 5000 [IU] via SUBCUTANEOUS
  Filled 2015-12-22 (×3): qty 1

## 2015-12-22 MED ORDER — IPRATROPIUM-ALBUTEROL 0.5-2.5 (3) MG/3ML IN SOLN
3.0000 mL | Freq: Once | RESPIRATORY_TRACT | Status: AC
  Administered 2015-12-22: 3 mL via RESPIRATORY_TRACT
  Filled 2015-12-22: qty 3

## 2015-12-22 MED ORDER — MORPHINE SULFATE (PF) 2 MG/ML IV SOLN: 2.0000 mg | INTRAVENOUS | Status: DC | PRN

## 2015-12-22 MED ORDER — ACETAMINOPHEN 325 MG PO TABS: 650.0000 mg | ORAL_TABLET | Freq: Four times a day (QID) | ORAL | Status: DC | PRN

## 2015-12-22 MED ORDER — METOPROLOL TARTRATE 25 MG PO TABS
25.0000 mg | ORAL_TABLET | Freq: Two times a day (BID) | ORAL | Status: DC
  Administered 2015-12-22 – 2015-12-23 (×3): 25 mg via ORAL
  Filled 2015-12-22 (×3): qty 1

## 2015-12-22 MED ORDER — DEXTROSE 5 % IV SOLN
1.0000 g | INTRAVENOUS | Status: DC
  Filled 2015-12-22: qty 10

## 2015-12-22 MED ORDER — ONDANSETRON HCL 4 MG PO TABS: 4.0000 mg | ORAL_TABLET | Freq: Four times a day (QID) | ORAL | Status: DC | PRN

## 2015-12-22 MED ORDER — SILDENAFIL CITRATE 20 MG PO TABS
20.0000 mg | ORAL_TABLET | Freq: Every day | ORAL | Status: DC
  Administered 2015-12-22 – 2015-12-23 (×2): 20 mg via ORAL
  Filled 2015-12-22 (×3): qty 1

## 2015-12-22 MED ORDER — CEFTRIAXONE SODIUM 1 G IJ SOLR
1.0000 g | Freq: Once | INTRAMUSCULAR | Status: AC
  Administered 2015-12-22: 1 g via INTRAVENOUS
  Filled 2015-12-22: qty 10

## 2015-12-22 MED ORDER — TIMOLOL MALEATE 0.5 % OP SOLN
1.0000 [drp] | Freq: Every day | OPHTHALMIC | Status: DC
  Administered 2015-12-22 – 2015-12-23 (×2): 1 [drp] via OPHTHALMIC
  Filled 2015-12-22: qty 5

## 2015-12-22 MED ORDER — BRIMONIDINE TARTRATE 0.2 % OP SOLN
1.0000 [drp] | Freq: Two times a day (BID) | OPHTHALMIC | Status: DC
  Administered 2015-12-22 – 2015-12-23 (×3): 1 [drp] via OPHTHALMIC
  Filled 2015-12-22: qty 5

## 2015-12-22 MED ORDER — PANTOPRAZOLE SODIUM 40 MG PO TBEC
40.0000 mg | DELAYED_RELEASE_TABLET | Freq: Every day | ORAL | Status: DC
  Administered 2015-12-22 – 2015-12-23 (×2): 40 mg via ORAL
  Filled 2015-12-22 (×2): qty 1

## 2015-12-22 MED ORDER — METHYLPREDNISOLONE SODIUM SUCC 125 MG IJ SOLR
60.0000 mg | Freq: Four times a day (QID) | INTRAMUSCULAR | Status: DC
  Administered 2015-12-22 – 2015-12-23 (×4): 60 mg via INTRAVENOUS
  Filled 2015-12-22 (×4): qty 2

## 2015-12-22 MED ORDER — POTASSIUM CHLORIDE CRYS ER 20 MEQ PO TBCR
40.0000 meq | EXTENDED_RELEASE_TABLET | Freq: Once | ORAL | Status: AC
Start: 2015-12-22 — End: 2015-12-22
  Administered 2015-12-22: 40 meq via ORAL
  Filled 2015-12-22: qty 2

## 2015-12-22 MED ORDER — LORAZEPAM 2 MG/ML IJ SOLN: 1.0000 mg | INTRAMUSCULAR | Status: DC | PRN

## 2015-12-22 MED ORDER — POTASSIUM CHLORIDE CRYS ER 20 MEQ PO TBCR
20.0000 meq | EXTENDED_RELEASE_TABLET | Freq: Every day | ORAL | Status: DC
  Administered 2015-12-22 – 2015-12-23 (×2): 20 meq via ORAL
  Filled 2015-12-22 (×2): qty 1

## 2015-12-22 MED ORDER — MEGESTROL ACETATE 40 MG PO TABS
40.0000 mg | ORAL_TABLET | Freq: Every day | ORAL | Status: DC
Start: 2015-12-22 — End: 2015-12-23
  Administered 2015-12-23: 09:00:00 40 mg via ORAL
  Filled 2015-12-22 (×3): qty 1

## 2015-12-22 MED ORDER — METHYLPREDNISOLONE SODIUM SUCC 125 MG IJ SOLR
125.0000 mg | Freq: Once | INTRAMUSCULAR | Status: AC
  Administered 2015-12-22: 125 mg via INTRAVENOUS
  Filled 2015-12-22: qty 2

## 2015-12-22 MED ORDER — IPRATROPIUM-ALBUTEROL 0.5-2.5 (3) MG/3ML IN SOLN
3.0000 mL | Freq: Four times a day (QID) | RESPIRATORY_TRACT | Status: DC
  Administered 2015-12-22 – 2015-12-23 (×2): 3 mL via RESPIRATORY_TRACT
  Filled 2015-12-22 (×3): qty 3

## 2015-12-22 MED ORDER — ALBUTEROL SULFATE (2.5 MG/3ML) 0.083% IN NEBU
2.5000 mg | INHALATION_SOLUTION | Freq: Four times a day (QID) | RESPIRATORY_TRACT | Status: DC | PRN
Start: 2015-12-22 — End: 2015-12-23

## 2015-12-22 MED ORDER — BISACODYL 10 MG RE SUPP: 10.0000 mg | Freq: Every day | RECTAL | Status: DC | PRN

## 2015-12-22 MED ORDER — ASPIRIN EC 81 MG PO TBEC
81.0000 mg | DELAYED_RELEASE_TABLET | Freq: Every day | ORAL | Status: DC
  Administered 2015-12-23: 81 mg via ORAL
  Filled 2015-12-22: qty 1

## 2015-12-22 MED ORDER — AMLODIPINE BESYLATE 5 MG PO TABS
5.0000 mg | ORAL_TABLET | Freq: Every day | ORAL | Status: DC
  Administered 2015-12-23: 09:00:00 5 mg via ORAL
  Filled 2015-12-22: qty 1

## 2015-12-22 MED ORDER — DEXTROSE 5 % IV SOLN
500.0000 mg | Freq: Once | INTRAVENOUS | Status: AC
  Administered 2015-12-22: 500 mg via INTRAVENOUS
  Filled 2015-12-22: qty 500

## 2015-12-22 MED ORDER — LIDOCAINE-PRILOCAINE 2.5-2.5 % EX CREA
TOPICAL_CREAM | CUTANEOUS | Status: AC
  Filled 2015-12-22: qty 5

## 2015-12-22 MED ORDER — ACETAMINOPHEN 650 MG RE SUPP: 650.0000 mg | Freq: Four times a day (QID) | RECTAL | Status: DC | PRN

## 2015-12-22 MED ORDER — ONDANSETRON HCL 4 MG/2ML IJ SOLN: 4.0000 mg | Freq: Four times a day (QID) | INTRAMUSCULAR | Status: DC | PRN

## 2015-12-22 MED ORDER — DOCUSATE SODIUM 100 MG PO CAPS
100.0000 mg | ORAL_CAPSULE | Freq: Two times a day (BID) | ORAL | Status: DC
  Administered 2015-12-22 – 2015-12-23 (×3): 100 mg via ORAL
  Filled 2015-12-22 (×3): qty 1

## 2015-12-22 MED ORDER — TIOTROPIUM BROMIDE MONOHYDRATE 18 MCG IN CAPS
18.0000 ug | ORAL_CAPSULE | Freq: Every day | RESPIRATORY_TRACT | Status: DC
  Administered 2015-12-23: 18 ug via RESPIRATORY_TRACT
  Filled 2015-12-22: qty 5

## 2015-12-22 MED ORDER — POTASSIUM CHLORIDE IN NACL 40-0.9 MEQ/L-% IV SOLN
INTRAVENOUS | Status: DC
  Administered 2015-12-22 (×2): 75 mL/h via INTRAVENOUS
  Filled 2015-12-22 (×3): qty 1000

## 2015-12-22 NOTE — ED Notes (Signed)
Pt presents to ED via [facility] from home. Per EMS pt c/o cough, SHOB, and chest tightness. EMS states pt has a hx of lung cancer and is being treated at [facility] center. Pt states was smoking a cigarette last night at approx 10pm when he started coughing, pt states since then has been coughing constantly. Pt c/o chest tightness at this time that started after the coughing started last night. EMS states FD gave 324 ASA PTA. EMS states pt was 82% on RA upon FD arrival, pt placed on 15L NRB and O2 saturation up to 98-100%. Pt noted to be 99% on RA upon arrival to ED.

## 2015-12-22 NOTE — ED Notes (Signed)
Report called to Tracy, RN

## 2015-12-22 NOTE — ED Notes (Signed)
Pt returned from X-Ray, MD at bedside at this time.

## 2015-12-22 NOTE — ED Notes (Signed)
Pt taken to Xray at this time.

## 2015-12-22 NOTE — H&P (Signed)
History and Physical    ZAYNE DRAHEIM XNA:[id number] DOB: 1950-08-07 DOA: 12/22/2015  Referring physician: Dr. Reita Cliche PCP: Kirk Ruths., MD  Specialists: Dr. Grayland Ormond  Chief Complaint: SOB  HPI: QUAID YEAKLE is a 65 y.o. male has a past medical history significant for lung cancer and COPD and HTN now with 1-2 day hx of worsening SOB and cough. Cough is non-productive. No fever. Denies Cp. Being treated for lung cancer by Dr. Grayland Ormond. Still smoking. In ER, pt tachypneic and mildly hypoxic. Bp elevated. Sx's persisted despite IV steroids and SVn's in the ER. CXR shows chronic changes. He is now admitted.  Review of Systems: The patient denies anorexia, fever, weight loss,, vision loss, decreased hearing, hoarseness, chest pain, syncope,  peripheral edema, balance deficits, hemoptysis, abdominal pain, melena, hematochezia, severe indigestion/heartburn, hematuria, incontinence, genital sores, muscle weakness, suspicious skin lesions, transient blindness, difficulty walking, depression, unusual weight change, abnormal bleeding, enlarged lymph nodes, angioedema, and breast masses.   Past Medical History  Diagnosis Date  . COPD (chronic obstructive pulmonary disease) ([place])   . Glaucoma   . Hypertension   . Cancer [facility])    Past Surgical History  Procedure Laterality Date  . Peripheral vascular catheterization N/A 09/24/2015    Procedure: [name];  Surgeon: Algernon Huxley, MD;  Location: [place] [facility];  Service: Cardiovascular;  Laterality: N/A;  . Port a cath placement    . Lung biopsy     Social History:  reports that he has been smoking Cigarettes.  He has a 25 pack-year smoking history. He has never used smokeless tobacco. He reports that he drinks alcohol. He reports that he does not use illicit drugs.  No Known Allergies  Family History  Problem Relation Age of Onset  . Diabetes Mellitus II Mother   . Hypertension Father     Prior to Admission medications    Medication Sig Start Date End Date Taking? Authorizing Provider  albuterol (PROVENTIL HFA;VENTOLIN HFA) 108 (90 BASE) MCG/ACT inhaler Inhale 2 puffs into the lungs every 6 (six) hours as needed for wheezing or shortness of breath. 04/29/15  Yes Loney Hering, MD  amLODipine (NORVASC) 5 MG tablet Take 1 tablet (5 mg total) by mouth daily. 09/17/15 09/16/16 Yes Lloyd Huger, MD  brimonidine (ALPHAGAN) 0.2 % ophthalmic solution Place 1 drop into both eyes 2 (two) times daily.  07/30/15  Yes Historical Provider, MD  hydrocortisone (ANUSOL-HC) 2.5 % rectal cream Place 1 application rectally 2 (two) times daily. 11/27/15  Yes Noreene Filbert, MD  lidocaine-prilocaine (EMLA) cream Apply 1 application topically as needed. Apply to port 1 hour prior to chemotherapy appointment. Cover with plastic wrap. 10/04/15  Yes Lloyd Huger, MD  megestrol (MEGACE) 40 MG tablet Take 1 tablet (40 mg total) by mouth daily. 10/18/15  Yes Lloyd Huger, MD  potassium chloride SA (K-DUR,KLOR-CON) 20 MEQ tablet Take 1 tablet (20 mEq total) by mouth daily. 11/29/15  Yes Lloyd Huger, MD  prochlorperazine (COMPAZINE) 10 MG tablet Take 1 tablet (10 mg total) by mouth every 6 (six) hours as needed for nausea or vomiting. 10/04/15  Yes Lloyd Huger, MD  sildenafil (REVATIO) 20 MG tablet Take 1 tablet (20 mg total) by mouth daily. 10/16/15  Yes Noreene Filbert, MD  sucralfate (CARAFATE) 1 g tablet Take 1 tablet (1 g total) by mouth 4 (four) times daily -  with meals and at bedtime. 11/08/15  Yes Noreene Filbert, MD  timolol (BETIMOL)  0.5 % ophthalmic solution Place 1 drop into both eyes daily.   Yes Historical Provider, MD   Physical Exam: Filed Vitals:   12/22/15 0930 12/22/15 1000 12/22/15 1030 12/22/15 1143  BP: 145/93 129/90 121/88 144/98  Pulse: 124 119  134  Temp:      TempSrc:      Resp: '28 24 19 22  '$ Height:      Weight:      SpO2: 100% 95%  90%     General:  WDWN in moderate respiratory distress,  [place]/AT  Eyes: PERRL, EOMI, no scleral icterus, conjunctiva clear  ENT: moist oropharynx without lesions or exudate, dentition fair, TM's benign  Neck: supple, no lymphadenopathy. No bruits or thyromegaly  Cardiovascular: rapid rate with regular rhythm without MRG; 2+ peripheral pulses, no JVD, no peripheral edema  Respiratory: scattered rhonchi with decreased breath sounds at the bases. No wheezes or rales. Respiratory effort increased  Abdomen: soft, non tender to palpation, positive bowel sounds, no guarding, no rebound, noorganomegaly  Skin: no rashes or lesions  Musculoskeletal: normal bulk and tone, no joint swelling  Psychiatric: normal mood and affect, A&OX3  Neurologic: CN 2-12 grossly intact, Motor strength 5/5 in all 4 groups with normal sensory exam and symmetric DTR's  Labs on Admission:  Basic Metabolic Panel:  Recent Labs Lab 12/20/15 0849 12/22/15 0810  NA 138 137  K 2.8* 3.3*  CL 106 104  CO2 24 23  GLUCOSE 96 102*  BUN 9 8  CREATININE 0.66 0.50*  CALCIUM 8.5* 9.0   Liver Function Tests:  Recent Labs Lab 12/20/15 0849 12/22/15 0810  AST 18 16  ALT 8* 9*  ALKPHOS 54 54  BILITOT 0.8 1.4*  PROT 6.3* 6.7  ALBUMIN 3.2* 3.4*   No results for input(s): LIPASE, AMYLASE in the last 168 hours. No results for input(s): AMMONIA in the last 168 hours. CBC:  Recent Labs Lab 12/20/15 0849 12/22/15 0810  WBC 3.5* 7.4  NEUTROABS 2.3  --   HGB 7.3* 9.2*  HCT 21.7* 28.2*  MCV 86.7 87.7  PLT 156 159   Cardiac Enzymes:  Recent Labs Lab 12/22/15 0810  TROPONINI <0.03    BNP (last 3 results) No results for input(s): BNP in the last 8760 hours.  ProBNP (last 3 results) No results for input(s): PROBNP in the last 8760 hours.  CBG: No results for input(s): GLUCAP in the last 168 hours.  Radiological Exams on Admission: Dg Chest 2 View  12/22/2015  CLINICAL DATA:  Shortness of breath. Known lung cancer currently being treated. EXAM: CHEST  2  VIEW COMPARISON:  Multiple CTs and chest x-rays since February 2017 FINDINGS: The site of the patient's known lung cancer projects over the infrahilar region, less prominent in the interval. A new right Port-A-Cath is present, in good position. No pneumothorax. No change in the cardiomediastinal silhouette. No new pulmonary nodules or masses. No focal infiltrates. Blunting of the costophrenic angles consistent with small effusions and probable underlying atelectasis. IMPRESSION: 1. Small effusions and underlying atelectasis. 2. The new right Port-A-Cath is in good position. 3. The patient's known left lung cancer is less prominent the interval. Electronically Signed   By: Dorise Bullion III M.D   On: 12/22/2015 08:42    EKG: Independently reviewed.  Assessment/Plan Principal Problem:   Acute respiratory distress (HCC) Active Problems:   COPD exacerbation (HCC)   Acute bronchitis   HTN, goal below 140/80   Will admit to floor with O2, IV ABX,  IV steroids and SVN's. Consult Oncology. Wean O2. Optimize Bp regimen. Repeat labs and CXR in AM.   Diet: low sodium Fluids: NS with K+'@75'$  DVT Prophylaxis: SQ Heparin  Code Status: FULL  Family Communication: yes  Disposition Plan: home  Time spent: 50 min

## 2015-12-22 NOTE — ED Provider Notes (Signed)
[facility] Emergency Department Provider Note   ____________________________________________  Time seen: Approximately 8:15 AM I have reviewed the triage vital signs and the triage nursing note.  HISTORY  Chief Complaint Shortness of Breath and Chest Pain   Historian Patient  HPI Carl Bell is a 65 y.o. male with a history of COPD and also lung cancer, is here for shortness of breath. Shrum's were started last night while he was smoking and has been intermittent all night. Reportedly with a 2% on room air when fire department arrived there and with nonrebreather his O2 sat was about 100%.  Patient's reporting some chest tightness as well as lots of coughing. Some wheezing. Cough is nonproductive, but is wet sounding. Denies fevers or chills. Symptoms are moderate. Coughing seems to make his shorts breath worse.    Past Medical History  Diagnosis Date  . COPD (chronic obstructive pulmonary disease) ([place])   . Glaucoma   . Hypertension     Patient Active Problem List   Diagnosis Date Noted  . Lung cancer ([place]) 09/23/2015  . Lung mass 09/03/2015    Past Surgical History  Procedure Laterality Date  . Peripheral vascular catheterization N/A 09/24/2015    Procedure: [name];  Surgeon: Algernon Huxley, MD;  Location: [place] CV LAB;  Service: Cardiovascular;  Laterality: N/A;  . Port a cath placement    . Lung biopsy      Current Outpatient Rx  Name  Route  Sig  Dispense  Refill  . albuterol (PROVENTIL HFA;VENTOLIN HFA) 108 (90 BASE) MCG/ACT inhaler   Inhalation   Inhale 2 puffs into the lungs every 6 (six) hours as needed for wheezing or shortness of breath.   1 Inhaler   0   . amLODipine (NORVASC) 5 MG tablet   Oral   Take 1 tablet (5 mg total) by mouth daily.   30 tablet   1   . brimonidine (ALPHAGAN) 0.2 % ophthalmic solution   Both Eyes   Place 1 drop into both eyes 2 (two) times daily.       4   . hydrocortisone  (ANUSOL-HC) 2.5 % rectal cream   Rectal   Place 1 application rectally 2 (two) times daily.   30 g   2   . lidocaine-prilocaine (EMLA) cream   Topical   Apply 1 application topically as needed. Apply to port 1 hour prior to chemotherapy appointment. Cover with plastic wrap.   30 g   2   . megestrol (MEGACE) 40 MG tablet   Oral   Take 1 tablet (40 mg total) by mouth daily.   30 tablet   1   . potassium chloride SA (K-DUR,KLOR-CON) 20 MEQ tablet   Oral   Take 1 tablet (20 mEq total) by mouth daily.   30 tablet   1   . prochlorperazine (COMPAZINE) 10 MG tablet   Oral   Take 1 tablet (10 mg total) by mouth every 6 (six) hours as needed for nausea or vomiting.   30 tablet   0   . sildenafil (REVATIO) 20 MG tablet   Oral   Take 1 tablet (20 mg total) by mouth daily.   5 tablet   0   . sucralfate (CARAFATE) 1 g tablet   Oral   Take 1 tablet (1 g total) by mouth 4 (four) times daily -  with meals and at bedtime.   90 tablet   6   .  timolol (BETIMOL) 0.5 % ophthalmic solution   Both Eyes   Place 1 drop into both eyes daily.           Allergies Review of patient's allergies indicates no known allergies.  History reviewed. No pertinent family history.  Social History Social History  Substance Use Topics  . Smoking status: Current Every Day Smoker -- 0.50 packs/day for 50 years    Types: Cigarettes  . Smokeless tobacco: Never Used  . Alcohol Use: 0.0 oz/week    0 Standard drinks or equivalent per week     Comment: 3 pints/week    Review of Systems  Constitutional: Negative for fever. Eyes: Negative for visual changes. ENT: Negative for sore throat. Cardiovascular: No pleuritic chest pain. Positive for chest tightness.Marland Kitchen Respiratory: Positive for shortness breath, wheezing, and coughing. Gastrointestinal: Negative for abdominal pain, vomiting and diarrhea. Genitourinary: Negative for dysuria. Musculoskeletal: Negative for back pain. Skin: Negative for  rash. Neurological: Negative for headache. 10 point Review of Systems otherwise negative ____________________________________________   PHYSICAL EXAM:  VITAL SIGNS: ED Triage Vitals  Enc Vitals Group     BP 12/22/15 0817 164/109 mmHg     Pulse Rate 12/22/15 0817 131     Resp 12/22/15 0817 28     Temp 12/22/15 0817 98.6 F (37 C)     Temp Source 12/22/15 0817 Oral     SpO2 12/22/15 0813 82 %     Weight 12/22/15 0817 140 lb (63.504 kg)     Height 12/22/15 0817 '6\' 1"'$  (1.854 m)     Head Cir --      Peak Flow --      Pain Score 12/22/15 0817 8     Pain Loc --      Pain Edu? --      Excl. in [place]? --      Constitutional: Alert and oriented. Moderate respiratory distress, frequent coughing. HEENT   Head: Normocephalic and atraumatic.      Eyes: Conjunctivae are normal. PERRL. Normal extraocular movements.      Ears:         Nose: No congestion/rhinnorhea.   Mouth/Throat: Mucous membranes are moist.   Neck: No stridor. Cardiovascular/Chest: Tachycardic, regular rhythm.  No murmurs, rubs, or gallops. Respiratory: Tachypnea without retractions. Decreased air movement throughout. Frequent wet/rhonchorous sounding cough without sputum production. Some expiratory wheezing. Gastrointestinal: Soft. No distention, no guarding, no rebound. Nontender.    Genitourinary/rectal:Deferred Musculoskeletal: Nontender with normal range of motion in all extremities. No joint effusions.  No lower extremity tenderness.  No edema. Neurologic:  Normal speech and language. No gross or focal neurologic deficits are appreciated. Skin:  Skin is warm, dry and intact. No rash noted. Psychiatric: Mood and affect are normal. Speech and behavior are normal. Patient exhibits appropriate insight and judgment.  ____________________________________________   EKG I, Lisa Roca, MD, the attending physician have personally viewed and interpreted all ECGs.  Sinus tachycardia. 126 bpm. Narrow QRS. Normal  axis. Nonspecific ST segment ____________________________________________  LABS (pertinent positives/negatives)  Labs Reviewed  CBC - Abnormal; Notable for the following:    RBC 3.21 (*)    Hemoglobin 9.2 (*)    HCT 28.2 (*)    RDW 20.8 (*)    All other components within normal limits  COMPREHENSIVE METABOLIC PANEL - Abnormal; Notable for the following:    Potassium 3.3 (*)    Glucose, Bld 102 (*)    Creatinine, Ser 0.50 (*)    Albumin 3.4 (*)  ALT 9 (*)    Total Bilirubin 1.4 (*)    All other components within normal limits  CULTURE, BLOOD (ROUTINE X 2)  CULTURE, BLOOD (ROUTINE X 2)  TROPONIN I  LACTIC ACID, PLASMA  LACTIC ACID, PLASMA    ____________________________________________  RADIOLOGY All Xrays were viewed by me. Imaging interpreted by Radiologist.  Chest x-ray two-view:  IMPRESSION: 1. Small effusions and underlying atelectasis. 2. The new right Port-A-Cath is in good position. 3. The patient's known left lung cancer is less prominent the interval. __________________________________________  PROCEDURES  Procedure(s) performed: None  Critical Care performed: None  ____________________________________________   ED COURSE / ASSESSMENT AND PLAN  Pertinent labs & imaging results that were available during my care of the patient were reviewed by me and considered in my medical decision making (see chart for details).   Patient's lungs sound very rhonchorous, somewhat concerning for pneumonia Clinically.  02 sat 80s at home, off nrb here now low 90s.  He is wheezing/tight and improved some with duoneb and solumedrol.  Chest xray with some atelectasis/effusion, no clear pneumonia, but I will go ahead and treat him for community acquired pneumonia.  He is not able to go home, when he stands his heart rate goes from 110-140.  I will give him some fluids as well as treat for copd exacerbation.    CONSULTATIONS:   Hospitalist for  admission.   Patient / Family / Caregiver informed of clinical course, medical decision-making process, and agree with plan.  ___________________________________________   FINAL CLINICAL IMPRESSION(S) / ED DIAGNOSES   Final diagnoses:  COPD exacerbation ([place])              Note: This dictation was prepared with Dragon dictation. Any transcriptional errors that result from this process are unintentional   I discussed return precautions, follow-up instructions, and discharged instructions with patient and/or family.   ___________________________________________   FINAL CLINICAL IMPRESSION(S) / ED DIAGNOSES   Final diagnoses:  COPD exacerbation ([place])              Note: This dictation was prepared with Dragon dictation. Any transcriptional errors that result from this process are unintentional   Lisa Roca, MD 12/22/15 1146

## 2015-12-23 ENCOUNTER — Observation Stay: Payer: Medicare Other

## 2015-12-23 LAB — CBC
HEMATOCRIT: 24.1 % — AB (ref 40.0–52.0)
HEMOGLOBIN: 8 g/dL — AB (ref 13.0–18.0)
MCH: 29.4 pg (ref 26.0–34.0)
MCHC: 33.1 g/dL (ref 32.0–36.0)
MCV: 88.8 fL (ref 80.0–100.0)
Platelets: 128 10*3/uL — ABNORMAL LOW (ref 150–440)
RBC: 2.72 MIL/uL — AB (ref 4.40–5.90)
RDW: 20.6 % — ABNORMAL HIGH (ref 11.5–14.5)
WBC: 5.6 10*3/uL (ref 3.8–10.6)

## 2015-12-23 LAB — COMPREHENSIVE METABOLIC PANEL
ALBUMIN: 2.6 g/dL — AB (ref 3.5–5.0)
ALK PHOS: 48 U/L (ref 38–126)
ALT: 7 U/L — ABNORMAL LOW (ref 17–63)
ANION GAP: 6 (ref 5–15)
AST: 12 U/L — ABNORMAL LOW (ref 15–41)
BILIRUBIN TOTAL: 1.1 mg/dL (ref 0.3–1.2)
BUN: 14 mg/dL (ref 6–20)
CALCIUM: 8.5 mg/dL — AB (ref 8.9–10.3)
CO2: 22 mmol/L (ref 22–32)
Chloride: 110 mmol/L (ref 101–111)
Creatinine, Ser: 0.63 mg/dL (ref 0.61–1.24)
GLUCOSE: 165 mg/dL — AB (ref 65–99)
POTASSIUM: 4.9 mmol/L (ref 3.5–5.1)
Sodium: 138 mmol/L (ref 135–145)
TOTAL PROTEIN: 5.4 g/dL — AB (ref 6.5–8.1)

## 2015-12-23 MED ORDER — PREDNISONE 50 MG PO TABS
50.0000 mg | ORAL_TABLET | Freq: Every day | ORAL | Status: DC
Start: 2015-12-24 — End: 2015-12-23

## 2015-12-23 MED ORDER — LEVOFLOXACIN 500 MG PO TABS: 500.0000 mg | ORAL_TABLET | Freq: Every day | ORAL | Status: DC

## 2015-12-23 MED ORDER — TIMOLOL MALEATE 0.5 % OP SOLN: 1.0000 [drp] | Freq: Every day | OPHTHALMIC | Status: DC

## 2015-12-23 MED ORDER — TIMOLOL HEMIHYDRATE 0.5 % OP SOLN: 1.0000 [drp] | Freq: Every day | OPHTHALMIC | Status: DC

## 2015-12-23 MED ORDER — METHYLPREDNISOLONE SODIUM SUCC 125 MG IJ SOLR: 60.0000 mg | Freq: Every day | INTRAMUSCULAR | Status: DC

## 2015-12-23 MED ORDER — PREDNISONE 10 MG PO TABS: ORAL_TABLET | ORAL | Status: DC

## 2015-12-23 MED ORDER — MOMETASONE FURO-FORMOTEROL FUM 200-5 MCG/ACT IN AERO: 2.0000 | INHALATION_SPRAY | Freq: Two times a day (BID) | RESPIRATORY_TRACT | Status: DC

## 2015-12-23 NOTE — Progress Notes (Signed)
Pt given d/c instructions r/t activity, diet, follow up care and when to call md and medications, voiced understanding, pt informed that 4 prescriptions were sent to [place], voiced understanding, pt d/c home via wheelchair escorted by staff

## 2015-12-23 NOTE — Discharge Instructions (Signed)
Stop smoking

## 2015-12-23 NOTE — Discharge Summary (Signed)
Carl Bell NAME: Carl Bell    MR#:  [id number]  DATE OF BIRTH:  October 03, 1950  DATE OF ADMISSION:  12/22/2015 ADMITTING PHYSICIAN: Idelle Crouch, MD  DATE OF DISCHARGE: 12/23/15  PRIMARY CARE PHYSICIAN: Kirk Ruths., MD    ADMISSION DIAGNOSIS:  SOB (shortness of breath) [R06.02] COPD exacerbation (HCC) [J44.1]  DISCHARGE DIAGNOSIS:  Acute COPD exacerbation Hypoxia-resolved sats 97% on RA Tobacco abuse On going chemo for lung cancer  SECONDARY DIAGNOSIS:   Past Medical History  Diagnosis Date  . COPD (chronic obstructive pulmonary disease) ([place])   . Glaucoma   . Hypertension   . Cancer [employer])     HOSPITAL COURSE:  Carl Bell is a 65 y.o. male has a past medical history significant for lung cancer and COPD and HTN now with 1-2 day hx of worsening SOB and cough. Cough is non-productive. No fever. Denies Cp. Being treated for lung cancer by Dr. Grayland Ormond. Still smoking. In ER, pt tachypneic and mildly hypoxic. Bp elevated. Sx's persisted despite IV steroids and SVn's in the ER. CXR shows chronic changes  1. Acute COPD exacerbation -received IV steroids, abxs, nebs and inhalers -sats 97% on RA -pt feels back to baseline -change to po steroid taper. No need for abxs at present cxr no PNA   2. On going chemo for lung cancer By Dr Grayland Ormond  3. Tobacco abuse Advised cessation  Overall improved D/c home CONSULTS OBTAINED:  Treatment Team:  Lloyd Huger, MD  DRUG ALLERGIES:  No Known Allergies  DISCHARGE MEDICATIONS:   Current Discharge Medication List    START taking these medications   Details  mometasone-formoterol (DULERA) 200-5 MCG/ACT AERO Inhale 2 puffs into the lungs 2 (two) times daily. Qty: 1 Inhaler, Refills: 0    predniSONE (DELTASONE) 10 MG tablet Start with 50 mg daily. Taper by 10 mg daily then stop Qty: 15 tablet, Refills: 0      CONTINUE these medications which  have NOT CHANGED   Details  albuterol (PROVENTIL HFA;VENTOLIN HFA) 108 (90 BASE) MCG/ACT inhaler Inhale 2 puffs into the lungs every 6 (six) hours as needed for wheezing or shortness of breath. Qty: 1 Inhaler, Refills: 0    amLODipine (NORVASC) 5 MG tablet Take 1 tablet (5 mg total) by mouth daily. Qty: 30 tablet, Refills: 1    brimonidine (ALPHAGAN) 0.2 % ophthalmic solution Place 1 drop into both eyes 2 (two) times daily.  Refills: 4    hydrocortisone (ANUSOL-HC) 2.5 % rectal cream Place 1 application rectally 2 (two) times daily. Qty: 30 g, Refills: 2    lidocaine-prilocaine (EMLA) cream Apply 1 application topically as needed. Apply to port 1 hour prior to chemotherapy appointment. Cover with plastic wrap. Qty: 30 g, Refills: 2   Associated Diagnoses: Malignant neoplasm of lower lobe of left lung (HCC)    megestrol (MEGACE) 40 MG tablet Take 1 tablet (40 mg total) by mouth daily. Qty: 30 tablet, Refills: 1   Associated Diagnoses: Malignant neoplasm of upper lobe of left lung (HCC)    potassium chloride SA (K-DUR,KLOR-CON) 20 MEQ tablet Take 1 tablet (20 mEq total) by mouth daily. Qty: 30 tablet, Refills: 1   Associated Diagnoses: Hypokalemia    prochlorperazine (COMPAZINE) 10 MG tablet Take 1 tablet (10 mg total) by mouth every 6 (six) hours as needed for nausea or vomiting. Qty: 30 tablet, Refills: 0   Associated Diagnoses: Malignant neoplasm of lower lobe of left  lung (HCC)    sildenafil (REVATIO) 20 MG tablet Take 1 tablet (20 mg total) by mouth daily. Qty: 5 tablet, Refills: 0    sucralfate (CARAFATE) 1 g tablet Take 1 tablet (1 g total) by mouth 4 (four) times daily -  with meals and at bedtime. Qty: 90 tablet, Refills: 6    timolol (BETIMOL) 0.5 % ophthalmic solution Place 1 drop into both eyes daily.        If you experience worsening of your admission symptoms, develop shortness of breath, life threatening emergency, suicidal or homicidal thoughts you must seek  medical attention immediately by calling 911 or calling your MD immediately  if symptoms less severe.  You Must read complete instructions/literature along with all the possible adverse reactions/side effects for all the Medicines you take and that have been prescribed to you. Take any new Medicines after you have completely understood and accept all the possible adverse reactions/side effects.   Please note  You were cared for by a hospitalist during your hospital stay. If you have any questions about your discharge medications or the care you received while you were in the hospital after you are discharged, you can call the unit and asked to speak with the hospitalist on call if the hospitalist that took care of you is not available. Once you are discharged, your primary care physician will handle any further medical issues. Please note that NO REFILLS for any discharge medications will be authorized once you are discharged, as it is imperative that you return to your primary care physician (or establish a relationship with a primary care physician if you do not have one) for your aftercare needs so that they can reassess your need for medications and monitor your lab values. Today   SUBJECTIVE   Feels back to baseline  VITAL SIGNS:  Blood pressure 109/76, pulse 77, temperature 98 F (36.7 C), temperature source Oral, resp. rate 16, height '6\' 1"'$  (1.854 m), weight 60.283 kg (132 lb 14.4 oz), SpO2 97 %.  I/O:   Intake/Output Summary (Last 24 hours) at 12/23/15 0907 Last data filed at 12/23/15 0600  Gross per 24 hour  Intake 701.25 ml  Output    400 ml  Net 301.25 ml    PHYSICAL EXAMINATION:  GENERAL:  65 y.o.-year-old patient lying in the bed with no acute distress.  EYES: Pupils equal, round, reactive to light and accommodation. No scleral icterus. Extraocular muscles intact.  HEENT: Head atraumatic, normocephalic. Oropharynx and nasopharynx clear.  NECK:  Supple, no jugular venous  distention. No thyroid enlargement, no tenderness.  LUNGS: Normal breath sounds bilaterally, no wheezing, rales,rhonchi or crepitation. No use of accessory muscles of respiration.  CARDIOVASCULAR: S1, S2 normal. No murmurs, rubs, or gallops.  ABDOMEN: Soft, non-tender, non-distended. Bowel sounds present. No organomegaly or mass.  EXTREMITIES: No pedal edema, cyanosis, or clubbing.  NEUROLOGIC: Cranial nerves II through XII are intact. Muscle strength 5/5 in all extremities. Sensation intact. Gait not checked.  PSYCHIATRIC: The patient is alert and oriented x 3.  SKIN: No obvious rash, lesion, or ulcer.   DATA REVIEW:   CBC   Recent Labs Lab 12/23/15 0459  WBC 5.6  HGB 8.0*  HCT 24.1*  PLT 128*    Chemistries   Recent Labs Lab 12/23/15 0459  NA 138  K 4.9  CL 110  CO2 22  GLUCOSE 165*  BUN 14  CREATININE 0.63  CALCIUM 8.5*  AST 12*  ALT 7*  ALKPHOS 48  BILITOT 1.1    Microbiology Results   Recent Results (from the past 240 hour(s))  Culture, blood (routine x 2)     Status: None (Preliminary result)   Collection Time: 12/22/15  9:25 AM  Result Value Ref Range Status   Specimen Description BLOOD LEFT ANTECUBITAL  Final   Special Requests BOTTLES DRAWN AEROBIC AND ANAEROBIC 4CC  Final   Culture NO GROWTH < 12 HOURS  Final   Report Status PENDING  Incomplete  Culture, blood (routine x 2)     Status: None (Preliminary result)   Collection Time: 12/22/15  9:25 AM  Result Value Ref Range Status   Specimen Description BLOOD LEFT FOREARM  Final   Special Requests BOTTLES DRAWN AEROBIC AND ANAEROBIC 3CC  Final   Culture NO GROWTH < 12 HOURS  Final   Report Status PENDING  Incomplete    RADIOLOGY:  Dg Chest 2 View  12/22/2015  CLINICAL DATA:  Shortness of breath. Known lung cancer currently being treated. EXAM: CHEST  2 VIEW COMPARISON:  Multiple CTs and chest x-rays since February 2017 FINDINGS: The site of the patient's known lung cancer projects over the  infrahilar region, less prominent in the interval. A new right Port-A-Cath is present, in good position. No pneumothorax. No change in the cardiomediastinal silhouette. No new pulmonary nodules or masses. No focal infiltrates. Blunting of the costophrenic angles consistent with small effusions and probable underlying atelectasis. IMPRESSION: 1. Small effusions and underlying atelectasis. 2. The new right Port-A-Cath is in good position. 3. The patient's known left lung cancer is less prominent the interval. Electronically Signed   By: Dorise Bullion III M.D   On: 12/22/2015 08:42     Management plans discussed with the patient, family and they are in agreement.  CODE STATUS:     Code Status Orders        Start     Ordered   12/22/15 1357  Full code   Continuous     12/22/15 1356    Code Status History    Date Active Date Inactive Code Status Order ID Comments User Context   This patient has a current code status but no historical code status.      TOTAL TIME TAKING CARE OF THIS PATIENT: 40 minutes.    Audel Coakley M.D on 12/23/2015 at 9:07 AM  Between 7am to 6pm - Pager - [phone] After 6pm go to [email or web address] - password [email or web address] [facility]  Office  [phone]  CC: Primary care physician; Kirk Ruths., MD

## 2015-12-25 NOTE — Progress Notes (Signed)
[place]  [phone] [phone]  ID: CHANNON AMBROSINI OB: [birth year]  MR#: [id number]  SVX#:[id number]  Patient Care Team: Kirk Ruths, MD as PCP - General (Internal Medicine)  CHIEF COMPLAINT:  Chief Complaint  Patient presents with  . Lung Cancer    INTERVAL HISTORY: Patient returns to clinic today for further evaluation and consideration of cycle 1 of 2 of his consolidation chemotherapy with carboplatinum and Taxol. He continues to have a decreased appetite as well as increased weakness and fatigue. He also has a persistent cough with occasional dyspnea on exertion. He otherwise feels well and is asymptomatic.  He has no neurologic complaints. He denies any recent fevers. He denies chest pain. He denies any hemoptysis.  He denies any nausea, vomiting, constipation, or diarrhea. He has no urinary complaints. Patient offers no further specific complaints  REVIEW OF SYSTEMS:   Review of Systems  Constitutional: Positive for malaise/fatigue. Negative for fever and weight loss.  Respiratory: Positive for cough and shortness of breath. Negative for hemoptysis.   Cardiovascular: Negative.  Negative for chest pain.  Gastrointestinal: Negative.   Musculoskeletal: Negative.   Neurological: Positive for weakness.  Psychiatric/Behavioral: The patient is not nervous/anxious.     As per HPI. Otherwise, a complete review of systems is negatve.  PAST MEDICAL HISTORY: Past Medical History  Diagnosis Date  . COPD (chronic obstructive pulmonary disease) ([place])   . Glaucoma   . Hypertension   . Cancer [facility])     PAST SURGICAL HISTORY: Past Surgical History  Procedure Laterality Date  . Peripheral vascular catheterization N/A 09/24/2015    Procedure: [name];  Surgeon: Algernon Huxley, MD;  Location: [facility];  Service: Cardiovascular;  Laterality: N/A;  . Port a cath placement    . Lung biopsy      FAMILY HISTORY:  Reviewed and unchanged. No reported history of malignancy or chronic disease.     ADVANCED DIRECTIVES:    HEALTH MAINTENANCE: Social History  Substance Use Topics  . Smoking status: Current Every Day Smoker -- 0.50 packs/day for 50 years    Types: Cigarettes  . Smokeless tobacco: Never Used  . Alcohol Use: 0.0 oz/week    0 Standard drinks or equivalent per week     Comment: 3 pints/week     No Known Allergies  Current Outpatient Prescriptions  Medication Sig Dispense Refill  . albuterol (PROVENTIL HFA;VENTOLIN HFA) 108 (90 BASE) MCG/ACT inhaler Inhale 2 puffs into the lungs every 6 (six) hours as needed for wheezing or shortness of breath. 1 Inhaler 0  . amLODipine (NORVASC) 5 MG tablet Take 1 tablet (5 mg total) by mouth daily. 30 tablet 1  . brimonidine (ALPHAGAN) 0.2 % ophthalmic solution Place 1 drop into both eyes 2 (two) times daily.   4  . hydrocortisone (ANUSOL-HC) 2.5 % rectal cream Place 1 application rectally 2 (two) times daily. 30 g 2  . lidocaine-prilocaine (EMLA) cream Apply 1 application topically as needed. Apply to port 1 hour prior to chemotherapy appointment. Cover with plastic wrap. 30 g 2  . megestrol (MEGACE) 40 MG tablet Take 1 tablet (40 mg total) by mouth daily. 30 tablet 1  . potassium chloride SA (K-DUR,KLOR-CON) 20 MEQ tablet Take 1 tablet (20 mEq total) by mouth daily. 30 tablet 1  . prochlorperazine (COMPAZINE) 10 MG tablet Take 1 tablet (10 mg total) by mouth every 6 (six) hours as needed for nausea or vomiting. 30 tablet 0  .  sildenafil (REVATIO) 20 MG tablet Take 1 tablet (20 mg total) by mouth daily. 5 tablet 0  . sucralfate (CARAFATE) 1 g tablet Take 1 tablet (1 g total) by mouth 4 (four) times daily -  with meals and at bedtime. 90 tablet 6  . mometasone-formoterol (DULERA) 200-5 MCG/ACT AERO Inhale 2 puffs into the lungs 2 (two) times daily. 1 Inhaler 0  . predniSONE (DELTASONE) 10 MG tablet Start with 50 mg daily. Taper by 10 mg daily then stop  15 tablet 0  . timolol (BETIMOL) 0.5 % ophthalmic solution Place 1 drop into both eyes daily. 10 mL 12  . timolol (TIMOPTIC) 0.5 % ophthalmic solution Place 1 drop into both eyes daily. 10 mL 12   No current facility-administered medications for this visit.    OBJECTIVE: Filed Vitals:   12/20/15 0901  BP: 121/78  Pulse: 101  Temp: 98.7 F (37.1 C)  Resp: 18     Body mass index is 17.66 kg/(m^2).    ECOG FS:0 - Asymptomatic  General: Well-developed, well-nourished, no acute distress. Eyes: Pink conjunctiva, anicteric sclera. Lungs: Clear to auscultation bilaterally. Heart: Regular rate and rhythm. No rubs, murmurs, or gallops. Abdomen: Soft, nontender, nondistended. No organomegaly noted, normoactive bowel sounds. Musculoskeletal: No edema, cyanosis, or clubbing. Neuro: Alert, answering all questions appropriately. Cranial nerves grossly intact. Skin: No rashes or petechiae noted. Psych: Normal affect.   LAB RESULTS:  Lab Results  Component Value Date   NA 138 12/23/2015   K 4.9 12/23/2015   CL 110 12/23/2015   CO2 22 12/23/2015   GLUCOSE 165* 12/23/2015   BUN 14 12/23/2015   CREATININE 0.63 12/23/2015   CALCIUM 8.5* 12/23/2015   PROT 5.4* 12/23/2015   ALBUMIN 2.6* 12/23/2015   AST 12* 12/23/2015   ALT 7* 12/23/2015   ALKPHOS 48 12/23/2015   BILITOT 1.1 12/23/2015   GFRNONAA >60 12/23/2015   GFRAA >60 12/23/2015    Lab Results  Component Value Date   WBC 5.6 12/23/2015   NEUTROABS 2.3 12/20/2015   HGB 8.0* 12/23/2015   HCT 24.1* 12/23/2015   MCV 88.8 12/23/2015   PLT 128* 12/23/2015     STUDIES: Dg Chest 2 View  12/23/2015  CLINICAL DATA:  Lung cancer, COPD EXAM: CHEST  2 VIEW COMPARISON:  12/22/2015 FINDINGS: Cardiomediastinal silhouette is stable. No acute infiltrate no pulmonary edema. Persistent irregular nodular consolidation in lingula infrahilar region without change from prior exam. Mild hyperinflation. Right IJ Port-A-Cath is unchanged in  position. IMPRESSION: No acute infiltrate no pulmonary edema. Persistent irregular nodular consolidation in lingula infrahilar region without change from prior exam. Mild hyperinflation. Right IJ Port-A-Cath is unchanged in position. Electronically Signed   By: Lahoma Crocker M.D.   On: 12/23/2015 10:11   Dg Chest 2 View  12/22/2015  CLINICAL DATA:  Shortness of breath. Known lung cancer currently being treated. EXAM: CHEST  2 VIEW COMPARISON:  Multiple CTs and chest x-rays since February 2017 FINDINGS: The site of the patient's known lung cancer projects over the infrahilar region, less prominent in the interval. A new right Port-A-Cath is present, in good position. No pneumothorax. No change in the cardiomediastinal silhouette. No new pulmonary nodules or masses. No focal infiltrates. Blunting of the costophrenic angles consistent with small effusions and probable underlying atelectasis. IMPRESSION: 1. Small effusions and underlying atelectasis. 2. The new right Port-A-Cath is in good position. 3. The patient's known left lung cancer is less prominent the interval. Electronically Signed   By: Shanon Brow  Mee Hives M.D   On: 12/22/2015 08:42    ASSESSMENT: Stage IIIa squamous cell carcinoma of the lung.  PLAN:    1. Lung cancer:  Patient completed his concurrent chemotherapy and XRT. Will delay consolidation therapy one week secondary to increased weakness and fatigue as well as anemia. Patient will instead receive one unit of packed red blood cells today. Turn to clinic in 1 week for reconsideration of of cycle 1 of 2 of consolidation chemotherapy. 2. Anemia: Secondary to chemotherapy, one unit packed red blood cells as above.  3. Swallowing difficulty: Continue Carafate as needed. 4. Thrombocytopenia: Mild, monitor.   5. Leukopenia: Resolved. 6. Weakness and fatigue: Multifactorial, blood as above. 7. Shortness of breath/cough: Chronic and unchanged, monitor.  Patient expressed understanding and was  in agreement with this plan. He also understands that He can call clinic at any time with any questions, concerns, or complaints.    Lloyd Huger, MD   12/25/2015 8:32 AM

## 2015-12-27 ENCOUNTER — Inpatient Hospital Stay: Payer: Medicare Other

## 2015-12-27 ENCOUNTER — Inpatient Hospital Stay (HOSPITAL_BASED_OUTPATIENT_CLINIC_OR_DEPARTMENT_OTHER): Payer: Medicare Other | Admitting: Oncology

## 2015-12-27 VITALS — BP 167/94 | HR 109 | Temp 96.2°F | Resp 18 | Wt 132.5 lb

## 2015-12-27 DIAGNOSIS — C3412 Malignant neoplasm of upper lobe, left bronchus or lung: Secondary | ICD-10-CM

## 2015-12-27 DIAGNOSIS — F17210 Nicotine dependence, cigarettes, uncomplicated: Secondary | ICD-10-CM

## 2015-12-27 DIAGNOSIS — R0602 Shortness of breath: Secondary | ICD-10-CM

## 2015-12-27 DIAGNOSIS — R05 Cough: Secondary | ICD-10-CM

## 2015-12-27 DIAGNOSIS — D6481 Anemia due to antineoplastic chemotherapy: Secondary | ICD-10-CM

## 2015-12-27 DIAGNOSIS — R531 Weakness: Secondary | ICD-10-CM

## 2015-12-27 DIAGNOSIS — R1310 Dysphagia, unspecified: Secondary | ICD-10-CM

## 2015-12-27 DIAGNOSIS — Z7689 Persons encountering health services in other specified circumstances: Secondary | ICD-10-CM

## 2015-12-27 DIAGNOSIS — Z5111 Encounter for antineoplastic chemotherapy: Secondary | ICD-10-CM | POA: Diagnosis not present

## 2015-12-27 DIAGNOSIS — D696 Thrombocytopenia, unspecified: Secondary | ICD-10-CM | POA: Diagnosis not present

## 2015-12-27 DIAGNOSIS — Z79899 Other long term (current) drug therapy: Secondary | ICD-10-CM

## 2015-12-27 DIAGNOSIS — Z923 Personal history of irradiation: Secondary | ICD-10-CM

## 2015-12-27 LAB — COMPREHENSIVE METABOLIC PANEL
ALK PHOS: 50 U/L (ref 38–126)
ALT: 13 U/L — ABNORMAL LOW (ref 17–63)
ANION GAP: 14 (ref 5–15)
AST: 20 U/L (ref 15–41)
Albumin: 3.5 g/dL (ref 3.5–5.0)
BILIRUBIN TOTAL: 0.9 mg/dL (ref 0.3–1.2)
BUN: 10 mg/dL (ref 6–20)
CALCIUM: 9.1 mg/dL (ref 8.9–10.3)
CO2: 20 mmol/L — AB (ref 22–32)
Chloride: 105 mmol/L (ref 101–111)
Creatinine, Ser: 0.75 mg/dL (ref 0.61–1.24)
GFR calc non Af Amer: >60 mL/min (ref >60)
Glucose, Bld: 89 mg/dL (ref 65–99)
POTASSIUM: 3.7 mmol/L (ref 3.5–5.1)
SODIUM: 139 mmol/L (ref 135–145)
TOTAL PROTEIN: 6.7 g/dL (ref 6.5–8.1)

## 2015-12-27 LAB — CBC WITH DIFFERENTIAL/PLATELET
Basophils Absolute: 0 10*3/uL (ref 0–0.1)
Basophils Relative: 1 %
EOS ABS: 0.1 10*3/uL (ref 0–0.7)
EOS PCT: 3 %
HCT: 25.8 % — ABNORMAL LOW (ref 40.0–52.0)
HEMOGLOBIN: 8.6 g/dL — AB (ref 13.0–18.0)
LYMPHS ABS: 0.5 10*3/uL — AB (ref 1.0–3.6)
Lymphocytes Relative: 14 %
MCH: 29.6 pg (ref 26.0–34.0)
MCHC: 33.3 g/dL (ref 32.0–36.0)
MCV: 88.9 fL (ref 80.0–100.0)
MONO ABS: 0.5 10*3/uL (ref 0.2–1.0)
MONOS PCT: 12 %
NEUTROS PCT: 70 %
Neutro Abs: 2.8 10*3/uL (ref 1.4–6.5)
Platelets: 160 10*3/uL (ref 150–440)
RBC: 2.9 MIL/uL — ABNORMAL LOW (ref 4.40–5.90)
RDW: 21.8 % — AB (ref 11.5–14.5)
WBC: 3.9 10*3/uL (ref 3.8–10.6)

## 2015-12-27 LAB — CULTURE, BLOOD (ROUTINE X 2)
Culture: NO GROWTH
Culture: NO GROWTH

## 2015-12-27 MED ORDER — SODIUM CHLORIDE 0.9 % IV SOLN
441.0000 mg | Freq: Once | INTRAVENOUS | Status: AC
  Administered 2015-12-27: 440 mg via INTRAVENOUS
  Filled 2015-12-27: qty 44

## 2015-12-27 MED ORDER — PALONOSETRON HCL INJECTION 0.25 MG/5ML
0.2500 mg | Freq: Once | INTRAVENOUS | Status: AC
  Administered 2015-12-27: 0.25 mg via INTRAVENOUS
  Filled 2015-12-27: qty 5

## 2015-12-27 MED ORDER — HEPARIN SOD (PORK) LOCK FLUSH 100 UNIT/ML IV SOLN
500.0000 [IU] | Freq: Once | INTRAVENOUS | Status: AC | PRN
  Administered 2015-12-27: 500 [IU]
  Filled 2015-12-27: qty 5

## 2015-12-27 MED ORDER — PEGFILGRASTIM 6 MG/0.6ML ~~LOC~~ PSKT
6.0000 mg | PREFILLED_SYRINGE | Freq: Once | SUBCUTANEOUS | Status: AC
  Administered 2015-12-27: 6 mg via SUBCUTANEOUS
  Filled 2015-12-27: qty 0.6

## 2015-12-27 MED ORDER — SODIUM CHLORIDE 0.9 % IV SOLN
175.0000 mg/m2 | Freq: Once | INTRAVENOUS | Status: AC
  Administered 2015-12-27: 312 mg via INTRAVENOUS
  Filled 2015-12-27: qty 52

## 2015-12-27 MED ORDER — SODIUM CHLORIDE 0.9 % IV SOLN
10.0000 mg | Freq: Once | INTRAVENOUS | Status: AC
  Administered 2015-12-27: 10 mg via INTRAVENOUS
  Filled 2015-12-27: qty 1

## 2015-12-27 MED ORDER — SODIUM CHLORIDE 0.9 % IV SOLN
Freq: Once | INTRAVENOUS | Status: AC
  Administered 2015-12-27: 11:00:00 via INTRAVENOUS
  Filled 2015-12-27: qty 1000

## 2015-12-27 MED ORDER — DIPHENHYDRAMINE HCL 50 MG/ML IJ SOLN
25.0000 mg | Freq: Once | INTRAMUSCULAR | Status: AC
  Administered 2015-12-27: 25 mg via INTRAVENOUS
  Filled 2015-12-27: qty 1

## 2015-12-27 MED ORDER — FAMOTIDINE IN NACL 20-0.9 MG/50ML-% IV SOLN
20.0000 mg | Freq: Once | INTRAVENOUS | Status: AC
  Administered 2015-12-27: 20 mg via INTRAVENOUS
  Filled 2015-12-27: qty 50

## 2015-12-27 NOTE — Progress Notes (Signed)
States is feeling well. Offers no complaints. States that appetite is improving.

## 2016-01-03 ENCOUNTER — Other Ambulatory Visit: Payer: Self-pay | Admitting: *Deleted

## 2016-01-03 ENCOUNTER — Encounter: Payer: Self-pay | Admitting: Radiation Oncology

## 2016-01-03 ENCOUNTER — Ambulatory Visit
Admission: RE | Admit: 2016-01-03 | Discharge: 2016-01-03 | Disposition: A | Discharge status: Home / Self Care | Payer: Medicare Other | Source: Ambulatory Visit | Attending: Radiation Oncology | Admitting: Radiation Oncology

## 2016-01-03 VITALS — BP 148/88 | HR 126 | Temp 96.8°F | Resp 20 | Wt 133.4 lb

## 2016-01-03 DIAGNOSIS — C3412 Malignant neoplasm of upper lobe, left bronchus or lung: Secondary | ICD-10-CM

## 2016-01-03 DIAGNOSIS — Z923 Personal history of irradiation: Secondary | ICD-10-CM | POA: Insufficient documentation

## 2016-01-03 NOTE — Progress Notes (Signed)
Radiation Oncology Follow up Note  Name: Carl Bell   Date:   01/03/2016 MRN:  [id number] DOB: 11-Sep-1950    This 65 y.o. male presents to the clinic today for one-month follow-up having completed radiation therapy with combined modality chemotherapy for stage IIIa squamous cell carcinoma the left upper lobe.  REFERRING PROVIDER: Kirk Ruths, MD  HPI: Patient is a 65 year old male now out 1 month having completed radiation therapy along with chemotherapy for stage IIIa (T2 N2 M0) squamous cell carcinoma the left upper lobe. He is currently on. Consolidative chemotherapy which he is tolerating well. He has had a transfusion recently. He specifically denies cough hemoptysis or chest tightness. He states his breathing is fine he's having no dysphagia.  COMPLICATIONS OF TREATMENT: none  FOLLOW UP COMPLIANCE: keeps appointments   PHYSICAL EXAM:  BP 148/88 mmHg  Pulse 126  Temp(Src) 96.8 F (36 C)  Resp 20  Wt 133 lb 6.1 oz (60.5 kg) Thin slightly cachectic male in NAD. Well-developed well-nourished patient in NAD. HEENT reveals PERLA, EOMI, discs not visualized.  Oral cavity is clear. No oral mucosal lesions are identified. Neck is clear without evidence of cervical or supraclavicular adenopathy. Lungs are clear to A&P. Cardiac examination is essentially unremarkable with regular rate and rhythm without murmur rub or thrill. Abdomen is benign with no organomegaly or masses noted. Motor sensory and DTR levels are equal and symmetric in the upper and lower extremities. Cranial nerves II through XII are grossly intact. Proprioception is intact. No peripheral adenopathy or edema is identified. No motor or sensory levels are noted. Crude visual fields are within normal range.  RADIOLOGY RESULTS: Recent chest x-ray reviewed showing persistent irregular nodularity in the lingula in the infrahilar region. I have scheduled CT scan with contrast of the chest prior to his next visit.  PLAN:  Present time patient is doing well. He continues consolidative chemotherapy under medical oncology's direction. I am please was overall progress. I've asked to see him back in 3 months and have ordered a CT scan of the chest with contrast prior to that visit. Should he have a CT scan prior to that we'll cancel that order. Patient continues close follow-up care with medical oncology. He knows to call with any concerns.  I would like to take this opportunity to thank you for allowing me to participate in the care of your patient.Armstead Peaks., MD

## 2016-01-06 NOTE — Progress Notes (Signed)
[place]  Telephone:(336) [phone] [phone]) [phone]  ID: Carl Bell OB: 21-Jun-1951  MR#: [id number]  JEH#:[id number]  Patient Care Team: Kirk Ruths, MD as PCP - General (Internal Medicine)  CHIEF COMPLAINT:  Chief Complaint  Patient presents with  . Lung Cancer    INTERVAL HISTORY: Patient returns to clinic today for further evaluation and consideration of cycle 1 of 2 of his consolidation chemotherapy with carboplatinum and Taxol. His appetite, weakness and fatigue have improved. He also has a persistent cough with occasional dyspnea on exertion. He otherwise feels well and is asymptomatic.  He has no neurologic complaints. He denies any recent fevers. He denies chest pain. He denies any hemoptysis.  He denies any nausea, vomiting, constipation, or diarrhea. He has no urinary complaints. Patient offers no further specific complaints  REVIEW OF SYSTEMS:   Review of Systems  Constitutional: Negative for fever, weight loss and malaise/fatigue.  Respiratory: Positive for cough and shortness of breath. Negative for hemoptysis.   Cardiovascular: Negative.  Negative for chest pain.  Gastrointestinal: Negative.  Negative for abdominal pain.  Musculoskeletal: Negative.   Neurological: Negative.  Negative for weakness.  Psychiatric/Behavioral: The patient is not nervous/anxious.     As per HPI. Otherwise, a complete review of systems is negatve.  PAST MEDICAL HISTORY: Past Medical History  Diagnosis Date  . COPD (chronic obstructive pulmonary disease) ([place])   . Glaucoma   . Hypertension   . Cancer [facility])     PAST SURGICAL HISTORY: Past Surgical History  Procedure Laterality Date  . Peripheral vascular catheterization N/A 09/24/2015    Procedure: [name];  Surgeon: Algernon Huxley, MD;  Location: [place] [facility];  Service: Cardiovascular;  Laterality: N/A;  . Port a cath placement    . Lung biopsy      FAMILY HISTORY: Reviewed and  unchanged. No reported history of malignancy or chronic disease.     ADVANCED DIRECTIVES:    HEALTH MAINTENANCE: Social History  Substance Use Topics  . Smoking status: Current Every Day Smoker -- 0.50 packs/day for 50 years    Types: Cigarettes  . Smokeless tobacco: Never Used  . Alcohol Use: 0.0 oz/week    0 Standard drinks or equivalent per week     Comment: 3 pints/week     No Known Allergies  Current Outpatient Prescriptions  Medication Sig Dispense Refill  . albuterol (PROVENTIL HFA;VENTOLIN HFA) 108 (90 BASE) MCG/ACT inhaler Inhale 2 puffs into the lungs every 6 (six) hours as needed for wheezing or shortness of breath. 1 Inhaler 0  . amLODipine (NORVASC) 5 MG tablet Take 1 tablet (5 mg total) by mouth daily. 30 tablet 1  . brimonidine (ALPHAGAN) 0.2 % ophthalmic solution Place 1 drop into both eyes 2 (two) times daily.   4  . hydrocortisone (ANUSOL-HC) 2.5 % rectal cream Place 1 application rectally 2 (two) times daily. 30 g 2  . lidocaine-prilocaine (EMLA) cream Apply 1 application topically as needed. Apply to port 1 hour prior to chemotherapy appointment. Cover with plastic wrap. 30 g 2  . megestrol (MEGACE) 40 MG tablet Take 1 tablet (40 mg total) by mouth daily. 30 tablet 1  . mometasone-formoterol (DULERA) 200-5 MCG/ACT AERO Inhale 2 puffs into the lungs 2 (two) times daily. 1 Inhaler 0  . potassium chloride SA (K-DUR,KLOR-CON) 20 MEQ tablet Take 1 tablet (20 mEq total) by mouth daily. 30 tablet 1  . predniSONE (DELTASONE) 10 MG tablet Start with 50 mg daily.  Taper by 10 mg daily then stop 15 tablet 0  . prochlorperazine (COMPAZINE) 10 MG tablet Take 1 tablet (10 mg total) by mouth every 6 (six) hours as needed for nausea or vomiting. 30 tablet 0  . sildenafil (REVATIO) 20 MG tablet Take 1 tablet (20 mg total) by mouth daily. 5 tablet 0  . sucralfate (CARAFATE) 1 g tablet Take 1 tablet (1 g total) by mouth 4 (four) times daily -  with meals and at bedtime. 90 tablet 6    . timolol (BETIMOL) 0.5 % ophthalmic solution Place 1 drop into both eyes daily. 10 mL 12   No current facility-administered medications for this visit.    OBJECTIVE: Filed Vitals:   12/27/15 0957  BP: 167/94  Pulse: 109  Temp: 96.2 F (35.7 C)  Resp: 18     Body mass index is 17.48 kg/(m^2).    ECOG FS:0 - Asymptomatic  General: Well-developed, well-nourished, no acute distress. Eyes: Pink conjunctiva, anicteric sclera. Lungs: Clear to auscultation bilaterally. Heart: Regular rate and rhythm. No rubs, murmurs, or gallops. Abdomen: Soft, nontender, nondistended. No organomegaly noted, normoactive bowel sounds. Musculoskeletal: No edema, cyanosis, or clubbing. Neuro: Alert, answering all questions appropriately. Cranial nerves grossly intact. Skin: No rashes or petechiae noted. Psych: Normal affect.   LAB RESULTS:  Lab Results  Component Value Date   NA 139 12/27/2015   K 3.7 12/27/2015   CL 105 12/27/2015   CO2 20* 12/27/2015   GLUCOSE 89 12/27/2015   BUN 10 12/27/2015   CREATININE 0.75 12/27/2015   CALCIUM 9.1 12/27/2015   PROT 6.7 12/27/2015   ALBUMIN 3.5 12/27/2015   AST 20 12/27/2015   ALT 13* 12/27/2015   ALKPHOS 50 12/27/2015   BILITOT 0.9 12/27/2015   GFRNONAA >60 12/27/2015   GFRAA >60 12/27/2015    Lab Results  Component Value Date   WBC 3.9 12/27/2015   NEUTROABS 2.8 12/27/2015   HGB 8.6* 12/27/2015   HCT 25.8* 12/27/2015   MCV 88.9 12/27/2015   PLT 160 12/27/2015     STUDIES: Dg Chest 2 View  12/23/2015  CLINICAL DATA:  Lung cancer, COPD EXAM: CHEST  2 VIEW COMPARISON:  12/22/2015 FINDINGS: Cardiomediastinal silhouette is stable. No acute infiltrate no pulmonary edema. Persistent irregular nodular consolidation in lingula infrahilar region without change from prior exam. Mild hyperinflation. Right IJ Port-A-Cath is unchanged in position. IMPRESSION: No acute infiltrate no pulmonary edema. Persistent irregular nodular consolidation in lingula  infrahilar region without change from prior exam. Mild hyperinflation. Right IJ Port-A-Cath is unchanged in position. Electronically Signed   By: Lahoma Crocker M.D.   On: 12/23/2015 10:11   Dg Chest 2 View  12/22/2015  CLINICAL DATA:  Shortness of breath. Known lung cancer currently being treated. EXAM: CHEST  2 VIEW COMPARISON:  Multiple CTs and chest x-rays since February 2017 FINDINGS: The site of the patient's known lung cancer projects over the infrahilar region, less prominent in the interval. A new right Port-A-Cath is present, in good position. No pneumothorax. No change in the cardiomediastinal silhouette. No new pulmonary nodules or masses. No focal infiltrates. Blunting of the costophrenic angles consistent with small effusions and probable underlying atelectasis. IMPRESSION: 1. Small effusions and underlying atelectasis. 2. The new right Port-A-Cath is in good position. 3. The patient's known left lung cancer is less prominent the interval. Electronically Signed   By: Dorise Bullion III M.D   On: 12/22/2015 08:42    ASSESSMENT: Stage IIIa squamous cell carcinoma of the  lung.  PLAN:    1. Lung cancer:  Patient completed his concurrent chemotherapy and XRT. Proceed with cycle 1 of consolidation therapy today patient will also receive OnPro Neulasta.  Return to clinic in 3 weeks for consideration of of cycle 2 of 2 of consolidation chemotherapy.  Plan to re-image with PET scan approximately 6-8 weeks after completion of his treatments. 2. Anemia: Secondary to chemotherapy. Improved after one unit packed red blood cells last week.  3. Swallowing difficulty: Improved. Continue Carafate as needed. 4. Thrombocytopenia: Resolved.   5. Leukopenia: Resolved. 6. Weakness and fatigue: Improved. 7. Shortness of breath/cough: Chronic and unchanged, monitor.  Patient expressed understanding and was in agreement with this plan. He also understands that He can call clinic at any time with any questions,  concerns, or complaints.    Lloyd Huger, MD   01/06/2016 8:53 AM

## 2016-01-17 ENCOUNTER — Inpatient Hospital Stay: Payer: Medicare Other | Attending: Oncology

## 2016-01-17 ENCOUNTER — Inpatient Hospital Stay (HOSPITAL_BASED_OUTPATIENT_CLINIC_OR_DEPARTMENT_OTHER): Payer: Medicare Other | Admitting: Oncology

## 2016-01-17 ENCOUNTER — Other Ambulatory Visit: Payer: Self-pay | Admitting: *Deleted

## 2016-01-17 ENCOUNTER — Inpatient Hospital Stay: Payer: Medicare Other

## 2016-01-17 VITALS — BP 152/92 | HR 106 | Temp 97.2°F | Resp 20 | Wt 137.8 lb

## 2016-01-17 DIAGNOSIS — Z79899 Other long term (current) drug therapy: Secondary | ICD-10-CM | POA: Diagnosis not present

## 2016-01-17 DIAGNOSIS — C801 Malignant (primary) neoplasm, unspecified: Secondary | ICD-10-CM

## 2016-01-17 DIAGNOSIS — D61818 Other pancytopenia: Secondary | ICD-10-CM | POA: Insufficient documentation

## 2016-01-17 DIAGNOSIS — D6959 Other secondary thrombocytopenia: Secondary | ICD-10-CM | POA: Insufficient documentation

## 2016-01-17 DIAGNOSIS — F17210 Nicotine dependence, cigarettes, uncomplicated: Secondary | ICD-10-CM | POA: Diagnosis not present

## 2016-01-17 DIAGNOSIS — R1310 Dysphagia, unspecified: Secondary | ICD-10-CM | POA: Diagnosis not present

## 2016-01-17 DIAGNOSIS — H409 Unspecified glaucoma: Secondary | ICD-10-CM | POA: Insufficient documentation

## 2016-01-17 DIAGNOSIS — D6481 Anemia due to antineoplastic chemotherapy: Secondary | ICD-10-CM

## 2016-01-17 DIAGNOSIS — C3412 Malignant neoplasm of upper lobe, left bronchus or lung: Secondary | ICD-10-CM | POA: Insufficient documentation

## 2016-01-17 DIAGNOSIS — J449 Chronic obstructive pulmonary disease, unspecified: Secondary | ICD-10-CM | POA: Insufficient documentation

## 2016-01-17 DIAGNOSIS — I10 Essential (primary) hypertension: Secondary | ICD-10-CM | POA: Insufficient documentation

## 2016-01-17 DIAGNOSIS — D6489 Other specified anemias: Secondary | ICD-10-CM

## 2016-01-17 DIAGNOSIS — C3410 Malignant neoplasm of upper lobe, unspecified bronchus or lung: Secondary | ICD-10-CM

## 2016-01-17 LAB — CBC WITH DIFFERENTIAL/PLATELET
Basophils Absolute: 0 10*3/uL (ref 0–0.1)
Basophils Relative: 1 %
EOS ABS: 0.1 10*3/uL (ref 0–0.7)
Eosinophils Relative: 2 %
HCT: 21.3 % — ABNORMAL LOW (ref 40.0–52.0)
HEMOGLOBIN: 7 g/dL — AB (ref 13.0–18.0)
LYMPHS ABS: 0.5 10*3/uL — AB (ref 1.0–3.6)
Lymphocytes Relative: 12 %
MCH: 31.2 pg (ref 26.0–34.0)
MCHC: 33 g/dL (ref 32.0–36.0)
MCV: 94.6 fL (ref 80.0–100.0)
MONO ABS: 0.4 10*3/uL (ref 0.2–1.0)
Neutro Abs: 2.8 10*3/uL (ref 1.4–6.5)
Platelets: 71 10*3/uL — ABNORMAL LOW (ref 150–440)
RBC: 2.26 MIL/uL — ABNORMAL LOW (ref 4.40–5.90)
RDW: 22 % — ABNORMAL HIGH (ref 11.5–14.5)
WBC: 3.8 10*3/uL (ref 3.8–10.6)

## 2016-01-17 LAB — COMPREHENSIVE METABOLIC PANEL
ALK PHOS: 66 U/L (ref 38–126)
ALT: 9 U/L — ABNORMAL LOW (ref 17–63)
ANION GAP: 8 (ref 5–15)
AST: 19 U/L (ref 15–41)
Albumin: 3.5 g/dL (ref 3.5–5.0)
BILIRUBIN TOTAL: 0.9 mg/dL (ref 0.3–1.2)
BUN: 10 mg/dL (ref 6–20)
CALCIUM: 8.9 mg/dL (ref 8.9–10.3)
CO2: 25 mmol/L (ref 22–32)
Chloride: 104 mmol/L (ref 101–111)
Creatinine, Ser: 0.55 mg/dL — ABNORMAL LOW (ref 0.61–1.24)
GFR calc non Af Amer: >60 mL/min (ref >60)
GLUCOSE: 106 mg/dL — AB (ref 65–99)
Potassium: 3.6 mmol/L (ref 3.5–5.1)
SODIUM: 137 mmol/L (ref 135–145)
TOTAL PROTEIN: 6.3 g/dL — AB (ref 6.5–8.1)

## 2016-01-17 MED ORDER — HEPARIN SOD (PORK) LOCK FLUSH 100 UNIT/ML IV SOLN
500.0000 [IU] | Freq: Once | INTRAVENOUS | Status: AC
  Administered 2016-01-17: 500 [IU] via INTRAVENOUS

## 2016-01-17 MED ORDER — HEPARIN SOD (PORK) LOCK FLUSH 100 UNIT/ML IV SOLN
INTRAVENOUS | Status: AC
  Filled 2016-01-17: qty 5

## 2016-01-17 NOTE — Progress Notes (Signed)
Carl Bell  [phone] [phone]  ID: Carl Bell OB: [birth year]  MR#: [id number]  RWE#:[id number]  Patient Care Team: Kirk Ruths, MD as PCP - General (Internal Medicine)  CHIEF COMPLAINT: Stage IIIa squamous cell carcinoma of the lingula of lung.  INTERVAL HISTORY: Patient returns to clinic today for further evaluation and consideration of cycle 2 of 2 of his consolidation chemotherapy with carboplatinum and Taxol. His only complaint today is of numbness in his left pinky finger. He otherwise feels well. He tolerated his first consolidation treatment without significant side effects.  His appetite, weakness and fatigue have improved. He also has a persistent cough with occasional dyspnea on exertion. He has no neurologic complaints. He denies any recent fevers. He denies chest pain. He denies any hemoptysis.  He denies any nausea, vomiting, constipation, or diarrhea. He has no urinary complaints. Patient offers no further specific complaints  REVIEW OF SYSTEMS:   Review of Systems  Constitutional: Negative for fever, weight loss and malaise/fatigue.  Respiratory: Positive for cough and shortness of breath. Negative for hemoptysis.   Cardiovascular: Negative.  Negative for chest pain.  Gastrointestinal: Negative.  Negative for abdominal pain.  Musculoskeletal: Negative.   Neurological: Positive for sensory change. Negative for weakness.  Endo/Heme/Allergies: Does not bruise/bleed easily.  Psychiatric/Behavioral: The patient is not nervous/anxious.     As per HPI. Otherwise, a complete review of systems is negatve.  PAST MEDICAL HISTORY: Past Medical History  Diagnosis Date  . COPD (chronic obstructive pulmonary disease) ([place])   . Glaucoma   . Hypertension   . Cancer [facility])     PAST SURGICAL HISTORY: Past Surgical History  Procedure Laterality Date  . Peripheral vascular catheterization N/A 09/24/2015    Procedure: Glori Luis Cath  Insertion;  Surgeon: Algernon Huxley, MD;  Location: [place] [facility];  Service: Cardiovascular;  Laterality: N/A;  . Port a cath placement    . Lung biopsy      FAMILY HISTORY: Reviewed and unchanged. No reported history of malignancy or chronic disease.     ADVANCED DIRECTIVES:    HEALTH MAINTENANCE: Social History  Substance Use Topics  . Smoking status: Current Every Day Smoker -- 0.50 packs/day for 50 years    Types: Cigarettes  . Smokeless tobacco: Never Used  . Alcohol Use: 0.0 oz/week    0 Standard drinks or equivalent per week     Comment: 3 pints/week     No Known Allergies  Current Outpatient Prescriptions  Medication Sig Dispense Refill  . albuterol (PROVENTIL HFA;VENTOLIN HFA) 108 (90 BASE) MCG/ACT inhaler Inhale 2 puffs into the lungs every 6 (six) hours as needed for wheezing or shortness of breath. 1 Inhaler 0  . amLODipine (NORVASC) 5 MG tablet Take 1 tablet (5 mg total) by mouth daily. 30 tablet 1  . brimonidine (ALPHAGAN) 0.2 % ophthalmic solution Place 1 drop into both eyes 2 (two) times daily.   4  . hydrocortisone (ANUSOL-HC) 2.5 % rectal cream Place 1 application rectally 2 (two) times daily. 30 g 2  . lidocaine-prilocaine (EMLA) cream Apply 1 application topically as needed. Apply to port 1 hour prior to chemotherapy appointment. Cover with plastic wrap. 30 g 2  . megestrol (MEGACE) 40 MG tablet Take 1 tablet (40 mg total) by mouth daily. 30 tablet 1  . mometasone-formoterol (DULERA) 200-5 MCG/ACT AERO Inhale 2 puffs into the lungs 2 (two) times daily. 1 Inhaler 0  . potassium chloride SA (K-DUR,KLOR-CON) 20 MEQ tablet  Take 1 tablet (20 mEq total) by mouth daily. 30 tablet 1  . predniSONE (DELTASONE) 10 MG tablet Start with 50 mg daily. Taper by 10 mg daily then stop 15 tablet 0  . prochlorperazine (COMPAZINE) 10 MG tablet Take 1 tablet (10 mg total) by mouth every 6 (six) hours as needed for nausea or vomiting. 30 tablet 0  . sildenafil (REVATIO) 20 MG  tablet Take 1 tablet (20 mg total) by mouth daily. 5 tablet 0  . sucralfate (CARAFATE) 1 g tablet Take 1 tablet (1 g total) by mouth 4 (four) times daily -  with meals and at bedtime. 90 tablet 6  . timolol (BETIMOL) 0.5 % ophthalmic solution Place 1 drop into both eyes daily. 10 mL 12   No current facility-administered medications for this visit.    OBJECTIVE: Filed Vitals:   01/17/16 0923  BP: 152/92  Pulse: 106  Temp: 97.2 F (36.2 C)  Resp: 20     Body mass index is 18.18 kg/(m^2).    ECOG FS:0 - Asymptomatic  General: Well-developed, well-nourished, no acute distress. Eyes: Pink conjunctiva, anicteric sclera. Lungs: Clear to auscultation bilaterally. Heart: Regular rate and rhythm. No rubs, murmurs, or gallops. Abdomen: Soft, nontender, nondistended. No organomegaly noted, normoactive bowel sounds. Musculoskeletal: No edema, cyanosis, or clubbing. Neuro: Alert, answering all questions appropriately. Cranial nerves grossly intact. Skin: No rashes or petechiae noted. Psych: Normal affect.   LAB RESULTS:  Lab Results  Component Value Date   NA 137 01/17/2016   K 3.6 01/17/2016   CL 104 01/17/2016   CO2 25 01/17/2016   GLUCOSE 106* 01/17/2016   BUN 10 01/17/2016   CREATININE 0.55* 01/17/2016   CALCIUM 8.9 01/17/2016   PROT 6.3* 01/17/2016   ALBUMIN 3.5 01/17/2016   AST 19 01/17/2016   ALT 9* 01/17/2016   ALKPHOS 66 01/17/2016   BILITOT 0.9 01/17/2016   GFRNONAA >60 01/17/2016   GFRAA >60 01/17/2016    Lab Results  Component Value Date   WBC 3.8 01/17/2016   NEUTROABS 2.8 01/17/2016   HGB 7.0* 01/17/2016   HCT 21.3* 01/17/2016   MCV 94.6 01/17/2016   PLT 71* 01/17/2016     STUDIES: Dg Chest 2 View  12/23/2015  CLINICAL DATA:  Lung cancer, COPD EXAM: CHEST  2 VIEW COMPARISON:  12/22/2015 FINDINGS: Cardiomediastinal silhouette is stable. No acute infiltrate no pulmonary edema. Persistent irregular nodular consolidation in lingula infrahilar region without  change from prior exam. Mild hyperinflation. Right IJ Port-A-Cath is unchanged in position. IMPRESSION: No acute infiltrate no pulmonary edema. Persistent irregular nodular consolidation in lingula infrahilar region without change from prior exam. Mild hyperinflation. Right IJ Port-A-Cath is unchanged in position. Electronically Signed   By: Lahoma Crocker M.D.   On: 12/23/2015 10:11   Dg Chest 2 View  12/22/2015  CLINICAL DATA:  Shortness of breath. Known lung cancer currently being treated. EXAM: CHEST  2 VIEW COMPARISON:  Multiple CTs and chest x-rays since February 2017 FINDINGS: The site of the patient's known lung cancer projects over the infrahilar region, less prominent in the interval. A new right Port-A-Cath is present, in good position. No pneumothorax. No change in the cardiomediastinal silhouette. No new pulmonary nodules or masses. No focal infiltrates. Blunting of the costophrenic angles consistent with small effusions and probable underlying atelectasis. IMPRESSION: 1. Small effusions and underlying atelectasis. 2. The new right Port-A-Cath is in good position. 3. The patient's known left lung cancer is less prominent the interval. Electronically Signed  By: Dorise Bullion III M.D   On: 12/22/2015 08:42    ASSESSMENT: Stage IIIa squamous cell carcinoma of the lingula of lung.  PLAN:    1. Stage IIIa squamous cell carcinoma of the lingula of lung:  Patient completed his concurrent chemotherapy and XRT. Delay cycle 2 of consolidation therapy today secondary to thrombocytopenia. Return to clinic in 1 week for reconsideration of treatment.  Plan to re-image with PET scan approximately 6-8 weeks after completion of his treatments. 2. Anemia: Secondary to chemotherapy. Improved after one unit packed red blood several weeks ago, but patient is trending down and may need another unit of blood.  3. Swallowing difficulty: Improved. Continue Carafate as needed. 4. Thrombocytopenia: Secondary to  chemotherapy, delay treatment as above. 5. Leukopenia: Resolved. 6. Weakness and fatigue: Improved. 7. Shortness of breath/cough: Chronic and unchanged, monitor.  Patient expressed understanding and was in agreement with this plan. He also understands that He can call clinic at any time with any questions, concerns, or complaints.    Lloyd Huger, MD   01/17/2016 10:33 AM

## 2016-01-17 NOTE — Progress Notes (Signed)
Patient here today for ongoing follow up and treatment consideration regarding lung cancer. Patient reports numbness to left pinky finger, denies numbness or tingling to right hand or feet. Patient denies any other concerns today.

## 2016-01-24 ENCOUNTER — Inpatient Hospital Stay: Payer: Medicare Other

## 2016-01-24 ENCOUNTER — Other Ambulatory Visit: Payer: Medicare Other

## 2016-01-24 ENCOUNTER — Inpatient Hospital Stay (HOSPITAL_BASED_OUTPATIENT_CLINIC_OR_DEPARTMENT_OTHER): Payer: Medicare Other | Admitting: Oncology

## 2016-01-24 ENCOUNTER — Ambulatory Visit: Payer: Medicare Other | Admitting: Oncology

## 2016-01-24 ENCOUNTER — Ambulatory Visit: Payer: Medicare Other

## 2016-01-24 VITALS — BP 122/81 | HR 96 | Temp 96.2°F | Resp 18 | Wt 129.6 lb

## 2016-01-24 DIAGNOSIS — C3410 Malignant neoplasm of upper lobe, unspecified bronchus or lung: Secondary | ICD-10-CM

## 2016-01-24 DIAGNOSIS — Z79899 Other long term (current) drug therapy: Secondary | ICD-10-CM

## 2016-01-24 DIAGNOSIS — D6959 Other secondary thrombocytopenia: Secondary | ICD-10-CM

## 2016-01-24 DIAGNOSIS — D61818 Other pancytopenia: Secondary | ICD-10-CM

## 2016-01-24 DIAGNOSIS — D6481 Anemia due to antineoplastic chemotherapy: Secondary | ICD-10-CM

## 2016-01-24 DIAGNOSIS — R1310 Dysphagia, unspecified: Secondary | ICD-10-CM

## 2016-01-24 DIAGNOSIS — D6489 Other specified anemias: Secondary | ICD-10-CM

## 2016-01-24 DIAGNOSIS — C3412 Malignant neoplasm of upper lobe, left bronchus or lung: Secondary | ICD-10-CM

## 2016-01-24 DIAGNOSIS — F17210 Nicotine dependence, cigarettes, uncomplicated: Secondary | ICD-10-CM

## 2016-01-24 LAB — COMPREHENSIVE METABOLIC PANEL
ALK PHOS: 54 U/L (ref 38–126)
ALT: 10 U/L — AB (ref 17–63)
ANION GAP: 6 (ref 5–15)
AST: 21 U/L (ref 15–41)
Albumin: 3.6 g/dL (ref 3.5–5.0)
BILIRUBIN TOTAL: 0.9 mg/dL (ref 0.3–1.2)
BUN: 12 mg/dL (ref 6–20)
CALCIUM: 8.7 mg/dL — AB (ref 8.9–10.3)
CO2: 25 mmol/L (ref 22–32)
CREATININE: 0.54 mg/dL — AB (ref 0.61–1.24)
Chloride: 105 mmol/L (ref 101–111)
GFR calc non Af Amer: >60 mL/min (ref >60)
Glucose, Bld: 119 mg/dL — ABNORMAL HIGH (ref 65–99)
Potassium: 4.2 mmol/L (ref 3.5–5.1)
Sodium: 136 mmol/L (ref 135–145)
TOTAL PROTEIN: 6.2 g/dL — AB (ref 6.5–8.1)

## 2016-01-24 LAB — CBC WITH DIFFERENTIAL/PLATELET
Basophils Absolute: 0 10*3/uL (ref 0–0.1)
Basophils Relative: 1 %
Eosinophils Absolute: 0.1 10*3/uL (ref 0–0.7)
Eosinophils Relative: 3 %
HEMATOCRIT: 20.8 % — AB (ref 40.0–52.0)
HEMOGLOBIN: 6.9 g/dL — AB (ref 13.0–18.0)
LYMPHS ABS: 0.5 10*3/uL — AB (ref 1.0–3.6)
MCH: 32.3 pg (ref 26.0–34.0)
MCHC: 33 g/dL (ref 32.0–36.0)
MCV: 98 fL (ref 80.0–100.0)
MONO ABS: 0.5 10*3/uL (ref 0.2–1.0)
NEUTROS ABS: 1.6 10*3/uL (ref 1.4–6.5)
Platelets: 81 10*3/uL — ABNORMAL LOW (ref 150–440)
RBC: 2.12 MIL/uL — ABNORMAL LOW (ref 4.40–5.90)
RDW: 22.3 % — ABNORMAL HIGH (ref 11.5–14.5)
WBC: 2.7 10*3/uL — ABNORMAL LOW (ref 3.8–10.6)

## 2016-01-24 LAB — SAMPLE TO BLOOD BANK

## 2016-01-24 LAB — PREPARE RBC (CROSSMATCH)

## 2016-01-24 MED ORDER — DIPHENHYDRAMINE HCL 50 MG/ML IJ SOLN
25.0000 mg | Freq: Once | INTRAMUSCULAR | Status: AC
Start: 2016-01-24 — End: 2016-01-24
  Administered 2016-01-24: 25 mg via INTRAVENOUS
  Filled 2016-01-24: qty 1

## 2016-01-24 MED ORDER — HEPARIN SOD (PORK) LOCK FLUSH 100 UNIT/ML IV SOLN
500.0000 [IU] | Freq: Once | INTRAVENOUS | Status: AC
  Administered 2016-01-24: 500 [IU] via INTRAVENOUS
  Filled 2016-01-24: qty 5

## 2016-01-24 MED ORDER — SODIUM CHLORIDE 0.9% FLUSH
10.0000 mL | INTRAVENOUS | Status: DC | PRN
  Administered 2016-01-24: 10 mL via INTRAVENOUS
  Filled 2016-01-24: qty 10

## 2016-01-24 MED ORDER — ACETAMINOPHEN 325 MG PO TABS
650.0000 mg | ORAL_TABLET | Freq: Once | ORAL | Status: AC
  Administered 2016-01-24: 650 mg via ORAL
  Filled 2016-01-24: qty 2

## 2016-01-24 MED ORDER — SODIUM CHLORIDE 0.9 % IV SOLN
250.0000 mL | Freq: Once | INTRAVENOUS | Status: AC
  Administered 2016-01-24: 250 mL via INTRAVENOUS
  Filled 2016-01-24: qty 250

## 2016-01-24 NOTE — Progress Notes (Signed)
States is feeling well today. Offers no complaints.

## 2016-01-25 ENCOUNTER — Inpatient Hospital Stay: Payer: Medicare Other

## 2016-01-25 LAB — TYPE AND SCREEN
ABO/RH(D): O POS
ANTIBODY SCREEN: NEGATIVE
Unit division: 0

## 2016-01-29 NOTE — Progress Notes (Signed)
[place]  [phone] [phone]) [phone]  ID: Carl Bell OB: [birth year]  MR#: [id number]  [id number]#:[id number]  Patient Care Team: Kirk Ruths, MD as PCP - General (Internal Medicine)  CHIEF COMPLAINT: Stage IIIa squamous cell carcinoma of the lingula of lung.  INTERVAL HISTORY: Patient returns to clinic today for further evaluation and reconsideration of cycle 2 of 2 of his consolidation chemotherapy with carboplatinum and Taxol. He currently feels well and is asymptomatic. He otherwise feels well. He tolerated his first consolidation treatment without significant side effects.  His appetite, weakness and fatigue have improved. He also has a persistent cough with occasional dyspnea on exertion. He has no neurologic complaints. He denies any recent fevers. He denies chest pain. He denies any hemoptysis.  He denies any nausea, vomiting, constipation, or diarrhea. He has no urinary complaints. Patient offers no further specific complaints  REVIEW OF SYSTEMS:   Review of Systems  Constitutional: Negative for fever, weight loss and malaise/fatigue.  Respiratory: Positive for cough and shortness of breath. Negative for hemoptysis.   Cardiovascular: Negative.  Negative for chest pain.  Gastrointestinal: Negative.  Negative for abdominal pain.  Musculoskeletal: Negative.   Neurological: Positive for sensory change. Negative for weakness.  Endo/Heme/Allergies: Does not bruise/bleed easily.  Psychiatric/Behavioral: The patient is not nervous/anxious.     As per HPI. Otherwise, a complete review of systems is negatve.  PAST MEDICAL HISTORY: Past Medical History  Diagnosis Date  . COPD (chronic obstructive pulmonary disease) ([place])   . Glaucoma   . Hypertension   . Cancer [facility])     PAST SURGICAL HISTORY: Past Surgical History  Procedure Laterality Date  . Peripheral vascular catheterization N/A 09/24/2015    Procedure: [name];  Surgeon:  Algernon Huxley, MD;  Location: [place] [facility];  Service: Cardiovascular;  Laterality: N/A;  . Port a cath placement    . Lung biopsy      FAMILY HISTORY: Reviewed and unchanged. No reported history of malignancy or chronic disease.     ADVANCED DIRECTIVES:    HEALTH MAINTENANCE: Social History  Substance Use Topics  . Smoking status: Current Every Day Smoker -- 0.50 packs/day for 50 years    Types: Cigarettes  . Smokeless tobacco: Never Used  . Alcohol Use: 0.0 oz/week    0 Standard drinks or equivalent per week     Comment: 3 pints/week     No Known Allergies  Current Outpatient Prescriptions  Medication Sig Dispense Refill  . albuterol (PROVENTIL HFA;VENTOLIN HFA) 108 (90 BASE) MCG/ACT inhaler Inhale 2 puffs into the lungs every 6 (six) hours as needed for wheezing or shortness of breath. 1 Inhaler 0  . amLODipine (NORVASC) 5 MG tablet Take 1 tablet (5 mg total) by mouth daily. 30 tablet 1  . brimonidine (ALPHAGAN) 0.2 % ophthalmic solution Place 1 drop into both eyes 2 (two) times daily.   4  . hydrocortisone (ANUSOL-HC) 2.5 % rectal cream Place 1 application rectally 2 (two) times daily. 30 g 2  . lidocaine-prilocaine (EMLA) cream Apply 1 application topically as needed. Apply to port 1 hour prior to chemotherapy appointment. Cover with plastic wrap. 30 g 2  . megestrol (MEGACE) 40 MG tablet Take 1 tablet (40 mg total) by mouth daily. 30 tablet 1  . mometasone-formoterol (DULERA) 200-5 MCG/ACT AERO Inhale 2 puffs into the lungs 2 (two) times daily. 1 Inhaler 0  . potassium chloride SA (K-DUR,KLOR-CON) 20 MEQ tablet Take 1 tablet (20 mEq  total) by mouth daily. 30 tablet 1  . prochlorperazine (COMPAZINE) 10 MG tablet Take 1 tablet (10 mg total) by mouth every 6 (six) hours as needed for nausea or vomiting. 30 tablet 0  . sildenafil (REVATIO) 20 MG tablet Take 1 tablet (20 mg total) by mouth daily. 5 tablet 0  . sucralfate (CARAFATE) 1 g tablet Take 1 tablet (1 g total) by  mouth 4 (four) times daily -  with meals and at bedtime. 90 tablet 6  . timolol (BETIMOL) 0.5 % ophthalmic solution Place 1 drop into both eyes daily. 10 mL 12   No current facility-administered medications for this visit.    OBJECTIVE: Filed Vitals:   01/24/16 1002  BP: 122/81  Pulse: 96  Temp: 96.2 F (35.7 C)  Resp: 18     Body mass index is 17.11 kg/(m^2).    ECOG FS:0 - Asymptomatic  General: Well-developed, well-nourished, no acute distress. Eyes: Pink conjunctiva, anicteric sclera. Lungs: Clear to auscultation bilaterally. Heart: Regular rate and rhythm. No rubs, murmurs, or gallops. Abdomen: Soft, nontender, nondistended. No organomegaly noted, normoactive bowel sounds. Musculoskeletal: No edema, cyanosis, or clubbing. Neuro: Alert, answering all questions appropriately. Cranial nerves grossly intact. Skin: No rashes or petechiae noted. Psych: Normal affect.   LAB RESULTS:  Lab Results  Component Value Date   NA 136 01/24/2016   K 4.2 01/24/2016   CL 105 01/24/2016   CO2 25 01/24/2016   GLUCOSE 119* 01/24/2016   BUN 12 01/24/2016   CREATININE 0.54* 01/24/2016   CALCIUM 8.7* 01/24/2016   PROT 6.2* 01/24/2016   ALBUMIN 3.6 01/24/2016   AST 21 01/24/2016   ALT 10* 01/24/2016   ALKPHOS 54 01/24/2016   BILITOT 0.9 01/24/2016   GFRNONAA >60 01/24/2016   GFRAA >60 01/24/2016    Lab Results  Component Value Date   WBC 2.7* 01/24/2016   NEUTROABS 1.6 01/24/2016   HGB 6.9* 01/24/2016   HCT 20.8* 01/24/2016   MCV 98.0 01/24/2016   PLT 81* 01/24/2016     STUDIES: No results found.  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  PLAN:    1. Stage IIIa squamous cell carcinoma of the lingula of lung:  Patient completed his concurrent chemotherapy and XRT. Given patient's persistent pancytopenia, we will discontinue chemotherapy at this time. Return to clinic in 6 weeks with repeat PET scan, further evaluation, and additional  treatment planning if necessary. 2. Anemia: Secondary to chemotherapy. Patient will receive one unit packed red blood cells today.  3. Swallowing difficulty: Improved. Continue Carafate as needed. 4. Thrombocytopenia: Secondary to chemotherapy, discontinue treatment as above. 5. Leukopenia: Discontinue treatment as above. 6. Weakness and fatigue: Improved. 7. Shortness of breath/cough: Chronic and unchanged, monitor.  Patient expressed understanding and was in agreement with this plan. He also understands that He can call clinic at any time with any questions, concerns, or complaints.    Lloyd Huger, MD   01/29/2016 8:24 AM

## 2016-02-28 ENCOUNTER — Ambulatory Visit
Admission: RE | Admit: 2016-02-28 | Discharge: 2016-02-28 | Disposition: A | Discharge status: Home / Self Care | Payer: Medicare Other | Source: Ambulatory Visit | Attending: Oncology | Admitting: Oncology

## 2016-02-28 DIAGNOSIS — C3410 Malignant neoplasm of upper lobe, unspecified bronchus or lung: Secondary | ICD-10-CM

## 2016-02-28 LAB — GLUCOSE, CAPILLARY: GLUCOSE-CAPILLARY: 81 mg/dL (ref 65–99)

## 2016-02-28 MED ORDER — FLUDEOXYGLUCOSE F - 18 (FDG) INJECTION
12.3600 | Freq: Once | INTRAVENOUS | Status: AC | PRN
  Administered 2016-02-28: 12.36 via INTRAVENOUS

## 2016-03-04 ENCOUNTER — Telehealth: Payer: Self-pay | Admitting: *Deleted

## 2016-03-04 DIAGNOSIS — M7989 Other specified soft tissue disorders: Secondary | ICD-10-CM

## 2016-03-04 NOTE — Telephone Encounter (Signed)
Pt is having swelling in both legs starting in the ankles. Pt is very concerned. Per Dr. Grayland Ormond, will order for BLE ultrasound to rule out clot. Pt verbalized understanding.

## 2016-03-06 ENCOUNTER — Ambulatory Visit
Admission: RE | Admit: 2016-03-06 | Discharge: 2016-03-06 | Disposition: A | Discharge status: Home / Self Care | Payer: Medicare Other | Source: Ambulatory Visit | Attending: Oncology | Admitting: Oncology

## 2016-03-06 DIAGNOSIS — R600 Localized edema: Secondary | ICD-10-CM | POA: Diagnosis not present

## 2016-03-06 DIAGNOSIS — M7989 Other specified soft tissue disorders: Secondary | ICD-10-CM | POA: Insufficient documentation

## 2016-03-11 NOTE — Progress Notes (Signed)
[place]  [phone] [phone]) [phone]  ID: Carl Bell OB: [birth year]  MR#: [id number]  JHE#:[id number]  Patient Care Team: Lloyd Huger, MD as PCP - General (Oncology)  CHIEF COMPLAINT: Stage IIIa squamous cell carcinoma of the lingula of lung.  INTERVAL HISTORY: Patient returns to clinic today for further evaluation and discussion of his PET scan results. He continues to feel well and is asymptomatic. His appetite, weakness and fatigue have improved. He continues to have a persistent cough with occasional dyspnea on exertion. He has no neurologic complaints. He denies any recent fevers. He denies chest pain. He denies any hemoptysis.  He denies any nausea, vomiting, constipation, or diarrhea. He has no urinary complaints. Patient offers no further specific complaints  REVIEW OF SYSTEMS:   Review of Systems  Constitutional: Negative for fever, malaise/fatigue and weight loss.  Respiratory: Positive for cough and shortness of breath. Negative for hemoptysis.   Cardiovascular: Negative.  Negative for chest pain.  Gastrointestinal: Negative.  Negative for abdominal pain.  Musculoskeletal: Negative.   Neurological: Positive for sensory change. Negative for weakness.  Endo/Heme/Allergies: Does not bruise/bleed easily.  Psychiatric/Behavioral: The patient is not nervous/anxious.     As per HPI. Otherwise, a complete review of systems is negatve.  PAST MEDICAL HISTORY: Past Medical History:  Diagnosis Date  . Cancer ([place])   . COPD (chronic obstructive pulmonary disease) ([place])   . Glaucoma   . Hypertension     PAST SURGICAL HISTORY: Past Surgical History:  Procedure Laterality Date  . LUNG BIOPSY    . PERIPHERAL VASCULAR CATHETERIZATION N/A 09/24/2015   Procedure: [name];  Surgeon: Algernon Huxley, MD;  Location: [facility];  Service: Cardiovascular;  Laterality: N/A;  . Port a cath placement      FAMILY HISTORY:  Reviewed and unchanged. No reported history of malignancy or chronic disease.     ADVANCED DIRECTIVES:    HEALTH MAINTENANCE: Social History  Substance Use Topics  . Smoking status: Current Every Day Smoker    Packs/day: 0.50    Years: 50.00    Types: Cigarettes  . Smokeless tobacco: Never Used  . Alcohol use 0.0 oz/week     Comment: 3 pints/week     No Known Allergies  Current Outpatient Prescriptions  Medication Sig Dispense Refill  . albuterol (PROVENTIL HFA;VENTOLIN HFA) 108 (90 BASE) MCG/ACT inhaler Inhale 2 puffs into the lungs every 6 (six) hours as needed for wheezing or shortness of breath. 1 Inhaler 0  . amLODipine (NORVASC) 5 MG tablet Take 1 tablet (5 mg total) by mouth daily. 30 tablet 1  . brimonidine (ALPHAGAN) 0.2 % ophthalmic solution Place 1 drop into both eyes 2 (two) times daily.   4  . hydrocortisone (ANUSOL-HC) 2.5 % rectal cream Place 1 application rectally 2 (two) times daily. 30 g 2  . lidocaine-prilocaine (EMLA) cream Apply 1 application topically as needed. Apply to port 1 hour prior to chemotherapy appointment. Cover with plastic wrap. 30 g 2  . megestrol (MEGACE) 40 MG tablet Take 1 tablet (40 mg total) by mouth daily. 30 tablet 1  . mometasone-formoterol (DULERA) 200-5 MCG/ACT AERO Inhale 2 puffs into the lungs 2 (two) times daily. 1 Inhaler 0  . potassium chloride SA (K-DUR,KLOR-CON) 20 MEQ tablet Take 1 tablet (20 mEq total) by mouth daily. 30 tablet 1  . prochlorperazine (COMPAZINE) 10 MG tablet Take 1 tablet (10 mg total) by mouth every 6 (six) hours as needed for  nausea or vomiting. 30 tablet 0  . sildenafil (REVATIO) 20 MG tablet Take 1 tablet (20 mg total) by mouth daily. 5 tablet 0  . sucralfate (CARAFATE) 1 g tablet Take 1 tablet (1 g total) by mouth 4 (four) times daily -  with meals and at bedtime. 90 tablet 6  . timolol (BETIMOL) 0.5 % ophthalmic solution Place 1 drop into both eyes daily. 10 mL 12   No current facility-administered  medications for this visit.     OBJECTIVE: Vitals:   03/12/16 1131  BP: (!) 138/92  Pulse: (!) 102  Resp: 18  Temp: 99 F (37.2 C)     Body mass index is 17.83 kg/m.    ECOG FS:0 - Asymptomatic  General: Well-developed, well-nourished, no acute distress. Eyes: Pink conjunctiva, anicteric sclera. Lungs: Clear to auscultation bilaterally. Heart: Regular rate and rhythm. No rubs, murmurs, or gallops. Abdomen: Soft, nontender, nondistended. No organomegaly noted, normoactive bowel sounds. Musculoskeletal: No edema, cyanosis, or clubbing. Neuro: Alert, answering all questions appropriately. Cranial nerves grossly intact. Skin: No rashes or petechiae noted. Psych: Normal affect.   LAB RESULTS:  Lab Results  Component Value Date   NA 136 01/24/2016   K 4.2 01/24/2016   CL 105 01/24/2016   CO2 25 01/24/2016   GLUCOSE 119 (H) 01/24/2016   BUN 12 01/24/2016   CREATININE 0.54 (L) 01/24/2016   CALCIUM 8.7 (L) 01/24/2016   PROT 6.2 (L) 01/24/2016   ALBUMIN 3.6 01/24/2016   AST 21 01/24/2016   ALT 10 (L) 01/24/2016   ALKPHOS 54 01/24/2016   BILITOT 0.9 01/24/2016   GFRNONAA >60 01/24/2016   GFRAA >60 01/24/2016    Lab Results  Component Value Date   WBC 2.7 (L) 01/24/2016   NEUTROABS 1.6 01/24/2016   HGB 6.9 (L) 01/24/2016   HCT 20.8 (L) 01/24/2016   MCV 98.0 01/24/2016   PLT 81 (L) 01/24/2016     STUDIES: Nm Pet Image Restag (ps) Skull Base To Thigh  Result Date: 02/28/2016 CLINICAL DATA:  Subsequent treatment strategy for lung cancer. EXAM: NUCLEAR MEDICINE PET SKULL BASE TO THIGH TECHNIQUE: 12.4 mCi F-18 FDG was injected intravenously. Full-ring PET imaging was performed from the skull base to thigh after the radiotracer. CT data was obtained and used for attenuation correction and anatomic localization. FASTING BLOOD GLUCOSE:  Value: 81 mg/dl COMPARISON:  PET-CT dated 09/13/2015 FINDINGS: NECK No hypermetabolic lymph nodes in the neck. CHEST Residual 1.9 x 0.9 cm  lingular nodule (series 4/ image 123) with max SUV 1.1, previously 3.8 x 2.8 cm with max SUV 20.6. Surrounding mild radiation changes in the inferior left upper lobe/ lingula. Underlying mild to moderate centrilobular and paraseptal emphysematous changes, upper lobe predominant. No focal consolidation. No pleural effusion or pneumothorax. The heart is normal in size. Coronary atherosclerosis in the LAD. Atherosclerotic calcifications of the aortic arch. Ectasia of the ascending thoracic aorta, measuring 3.7 cm. Right chest port terminating in the mid SVC. No hypermetabolic thoracic lymphadenopathy. ABDOMEN/PELVIS No abnormal hypermetabolic activity within the liver, pancreas, adrenal glands, or spleen. 3.1 cm rim calcified lesion in the medial interpolar left kidney (series 4/image 170). 1.7 cm rim calcified lesion in the medial right lower kidney (series 4/image 182). 3.3 cm cyst in the posterior right lower kidney (series 4/image 173). Atherosclerotic calcifications of the abdominal aorta and branch vessels. Large right inguinal/scrotal hernia containing colon, small bowel, fat, and fluid (series 4/image 273). No evidence of bowel obstruction. No hypermetabolic lymph nodes in the abdomen  or pelvis. SKELETON No focal hypermetabolic activity to suggest skeletal metastasis. IMPRESSION: Residual 1.9 x 0.9 cm lingular nodule, decreased, without appreciable hypermetabolism. No findings suspicious for metastatic disease. Electronically Signed   By: Julian Hy M.D.   On: 02/28/2016 16:00   US Venous Img Lower Bilateral  Result Date: 03/06/2016 CLINICAL DATA:  Bilateral lower extremity swelling and pain for 1 week EXAM: BILATERAL LOWER EXTREMITY VENOUS DOPPLER ULTRASOUND TECHNIQUE: Gray-scale sonography with graded compression, as well as color Doppler and duplex ultrasound were performed to evaluate the lower extremity deep venous systems from the level of the common femoral vein and including the common  femoral, femoral, profunda femoral, popliteal and calf veins including the posterior tibial, peroneal and gastrocnemius veins when visible. The superficial great saphenous vein was also interrogated. Spectral Doppler was utilized to evaluate flow at rest and with distal augmentation maneuvers in the common femoral, femoral and popliteal veins. COMPARISON:  None. FINDINGS: RIGHT LOWER EXTREMITY Common Femoral Vein: No evidence of thrombus. Normal compressibility, respiratory phasicity and response to augmentation. Saphenofemoral Junction: No evidence of thrombus. Normal compressibility and flow on color Doppler imaging. Profunda Femoral Vein: No evidence of thrombus. Normal compressibility and flow on color Doppler imaging. Femoral Vein: No evidence of thrombus. Normal compressibility, respiratory phasicity and response to augmentation. Popliteal Vein: No evidence of thrombus. Normal compressibility, respiratory phasicity and response to augmentation. Calf Veins: Limited assessment of the calf veins secondary to edema. Superficial Great Saphenous Vein: No evidence of thrombus. Normal compressibility and flow on color Doppler imaging. Venous Reflux:  None. Other Findings:  None. LEFT LOWER EXTREMITY Common Femoral Vein: No evidence of thrombus. Normal compressibility, respiratory phasicity and response to augmentation. Saphenofemoral Junction: No evidence of thrombus. Normal compressibility and flow on color Doppler imaging. Profunda Femoral Vein: No evidence of thrombus. Normal compressibility and flow on color Doppler imaging. Femoral Vein: No evidence of thrombus. Normal compressibility, respiratory phasicity and response to augmentation. Popliteal Vein: No evidence of thrombus. Normal compressibility, respiratory phasicity and response to augmentation. Calf Veins: Limited assessment of the calf veins secondary to peripheral edema. Superficial Great Saphenous Vein: No evidence of thrombus. Normal compressibility  and flow on color Doppler imaging. Venous Reflux:  None. Other Findings:  None. IMPRESSION: No evidence of significant femoral popliteal DVT. Limited assessment of the calf veins. Subcutaneous peripheral edema. Electronically Signed   By: Jerilynn Mages.  Shick M.D.   On: 03/06/2016 11:23    ASSESSMENT: Stage IIIa squamous cell carcinoma of the lingula of lung.  PLAN:    1. Stage IIIa squamous cell carcinoma of the lingula of lung:  Patient completed his concurrent chemotherapy and XRT. Patient only received one infusion of consolidation chemotherapy on December 27, 2015, but this was discontinued secondary to persistent pancytopenia. PET scan results reviewed independently and reported as above with significant improvement of disease with no obvious residual malignancy. No further treatments are needed at this time. Return to clinic in 3 months with repeat imaging and further evaluation. If his CT scan remain stable at that time, he likely can be switched imaging every 6 months. 2. Anemia: Secondary to chemotherapy. Improved with blood transfusion approximately one month ago.  3. Thrombocytopenia: Secondary to chemotherapy, discontinue treatment as above. 5. Leukopenia: Discontinue treatment as above. 6. Weakness and fatigue: Improved. 7. Shortness of breath/cough: Chronic and unchanged, monitor.  Patient expressed understanding and was in agreement with this plan. He also understands that He can call clinic at any time with any questions, concerns, or complaints.  Lloyd Huger, MD   03/17/2016 8:36 AM

## 2016-03-12 ENCOUNTER — Inpatient Hospital Stay: Payer: Medicare Other | Attending: Oncology | Admitting: Oncology

## 2016-03-12 VITALS — BP 138/92 | HR 102 | Temp 99.0°F | Resp 18 | Wt 135.1 lb

## 2016-03-12 DIAGNOSIS — R130 Aphagia: Secondary | ICD-10-CM

## 2016-03-12 DIAGNOSIS — D61818 Other pancytopenia: Secondary | ICD-10-CM | POA: Diagnosis not present

## 2016-03-12 DIAGNOSIS — Z79899 Other long term (current) drug therapy: Secondary | ICD-10-CM

## 2016-03-12 DIAGNOSIS — C3412 Malignant neoplasm of upper lobe, left bronchus or lung: Secondary | ICD-10-CM | POA: Diagnosis not present

## 2016-03-12 DIAGNOSIS — F17210 Nicotine dependence, cigarettes, uncomplicated: Secondary | ICD-10-CM | POA: Diagnosis not present

## 2016-03-12 DIAGNOSIS — J449 Chronic obstructive pulmonary disease, unspecified: Secondary | ICD-10-CM | POA: Diagnosis not present

## 2016-03-12 DIAGNOSIS — C3410 Malignant neoplasm of upper lobe, unspecified bronchus or lung: Secondary | ICD-10-CM

## 2016-03-12 DIAGNOSIS — D6959 Other secondary thrombocytopenia: Secondary | ICD-10-CM

## 2016-03-12 DIAGNOSIS — I10 Essential (primary) hypertension: Secondary | ICD-10-CM | POA: Insufficient documentation

## 2016-03-12 DIAGNOSIS — H409 Unspecified glaucoma: Secondary | ICD-10-CM | POA: Diagnosis not present

## 2016-03-12 DIAGNOSIS — D6481 Anemia due to antineoplastic chemotherapy: Secondary | ICD-10-CM | POA: Diagnosis not present

## 2016-03-12 NOTE — Progress Notes (Signed)
States is having numbness in left little finger and continues to have swelling in both legs.

## 2016-03-25 ENCOUNTER — Ambulatory Visit: Payer: Medicare Other

## 2016-04-03 ENCOUNTER — Inpatient Hospital Stay: Payer: Medicare Other | Attending: Radiation Oncology

## 2016-04-03 ENCOUNTER — Ambulatory Visit
Admission: RE | Admit: 2016-04-03 | Discharge: 2016-04-03 | Disposition: A | Discharge status: Home / Self Care | Payer: Medicare Other | Source: Ambulatory Visit | Attending: Radiation Oncology | Admitting: Radiation Oncology

## 2016-04-03 ENCOUNTER — Encounter: Payer: Self-pay | Admitting: Radiation Oncology

## 2016-04-03 ENCOUNTER — Other Ambulatory Visit: Payer: Self-pay | Admitting: *Deleted

## 2016-04-03 VITALS — BP 146/88 | HR 103 | Temp 97.2°F | Resp 21 | Wt 123.0 lb

## 2016-04-03 DIAGNOSIS — Z923 Personal history of irradiation: Secondary | ICD-10-CM | POA: Diagnosis not present

## 2016-04-03 DIAGNOSIS — D6959 Other secondary thrombocytopenia: Secondary | ICD-10-CM | POA: Diagnosis not present

## 2016-04-03 DIAGNOSIS — C3412 Malignant neoplasm of upper lobe, left bronchus or lung: Secondary | ICD-10-CM | POA: Diagnosis present

## 2016-04-03 DIAGNOSIS — Z95828 Presence of other vascular implants and grafts: Secondary | ICD-10-CM

## 2016-04-03 DIAGNOSIS — F17210 Nicotine dependence, cigarettes, uncomplicated: Secondary | ICD-10-CM | POA: Insufficient documentation

## 2016-04-03 DIAGNOSIS — Z452 Encounter for adjustment and management of vascular access device: Secondary | ICD-10-CM | POA: Diagnosis not present

## 2016-04-03 DIAGNOSIS — R130 Aphagia: Secondary | ICD-10-CM | POA: Insufficient documentation

## 2016-04-03 DIAGNOSIS — D61818 Other pancytopenia: Secondary | ICD-10-CM | POA: Insufficient documentation

## 2016-04-03 DIAGNOSIS — Z79899 Other long term (current) drug therapy: Secondary | ICD-10-CM | POA: Insufficient documentation

## 2016-04-03 DIAGNOSIS — D6481 Anemia due to antineoplastic chemotherapy: Secondary | ICD-10-CM | POA: Insufficient documentation

## 2016-04-03 MED ORDER — HEPARIN SOD (PORK) LOCK FLUSH 100 UNIT/ML IV SOLN
500.0000 [IU] | Freq: Once | INTRAVENOUS | Status: AC
  Administered 2016-04-03: 500 [IU] via INTRAVENOUS

## 2016-04-03 MED ORDER — SODIUM CHLORIDE 0.9% FLUSH
10.0000 mL | INTRAVENOUS | Status: DC | PRN
  Administered 2016-04-03: 10 mL via INTRAVENOUS
  Filled 2016-04-03: qty 10

## 2016-04-03 NOTE — Progress Notes (Signed)
Radiation Oncology Follow up Note  Name: Carl Bell   Date:   04/03/2016 MRN:  [id number] DOB: 05/15/1951    This 65 y.o. male presents to the clinic today for four-month follow-up status post concurrent chemotherapy for stage IIIa squamous cell carcinoma the left upper lobe.  REFERRING PROVIDER: Kirk Ruths, MD  HPI: Patient is a 65 year old male now out 4 months having completed combined modality treatment with chemotherapy and radiation therapy for squamous cell carcinoma of the left upper lobe stage IIIa (T2 N2 M0). Seen today in routine follow-up he is doing well. He specifically denies cough hemoptysis dysphasia regular exacerbation and pulmonary findings. Had a PET CT scan done in August showing excellent resolution of hypermetabolic activity in the chest..  COMPLICATIONS OF TREATMENT: none  FOLLOW UP COMPLIANCE: keeps appointments   PHYSICAL EXAM:  BP (!) 146/88   Pulse (!) 103   Temp 97.2 F (36.2 C)   Resp (!) 21   Wt 123 lb 0.3 oz (55.8 kg)   BMI 16.23 kg/m  Well-developed well-nourished patient in NAD. HEENT reveals PERLA, EOMI, discs not visualized.  Oral cavity is clear. No oral mucosal lesions are identified. Neck is clear without evidence of cervical or supraclavicular adenopathy. Lungs are clear to A&P. Cardiac examination is essentially unremarkable with regular rate and rhythm without murmur rub or thrill. Abdomen is benign with no organomegaly or masses noted. Motor sensory and DTR levels are equal and symmetric in the upper and lower extremities. Cranial nerves II through XII are grossly intact. Proprioception is intact. No peripheral adenopathy or edema is identified. No motor or sensory levels are noted. Crude visual fields are within normal range.  RADIOLOGY RESULTS: PET CT scan is reviewed compatible above-stated findings.  PLAN: Present time patient is doing well with excellent response to treatment by PET CT criteria. I am please was overall  progress. I've asked to see him back in 6 months for follow-up. He continues close follow-up care with medical oncology. Patient is to call with any concerns.  I would like to take this opportunity to thank you for allowing me to participate in the care of your patient.Armstead Peaks., MD

## 2016-04-14 ENCOUNTER — Ambulatory Visit: Payer: Medicare Other

## 2016-05-15 ENCOUNTER — Inpatient Hospital Stay: Payer: Medicare Other

## 2016-05-15 ENCOUNTER — Inpatient Hospital Stay: Payer: Medicare Other | Attending: Oncology

## 2016-05-15 DIAGNOSIS — F17210 Nicotine dependence, cigarettes, uncomplicated: Secondary | ICD-10-CM | POA: Diagnosis not present

## 2016-05-15 DIAGNOSIS — C3412 Malignant neoplasm of upper lobe, left bronchus or lung: Secondary | ICD-10-CM | POA: Insufficient documentation

## 2016-05-15 DIAGNOSIS — Z452 Encounter for adjustment and management of vascular access device: Secondary | ICD-10-CM | POA: Insufficient documentation

## 2016-05-15 DIAGNOSIS — Z79899 Other long term (current) drug therapy: Secondary | ICD-10-CM | POA: Diagnosis not present

## 2016-05-15 DIAGNOSIS — D6481 Anemia due to antineoplastic chemotherapy: Secondary | ICD-10-CM | POA: Diagnosis not present

## 2016-05-15 DIAGNOSIS — Z95828 Presence of other vascular implants and grafts: Secondary | ICD-10-CM

## 2016-05-15 DIAGNOSIS — R130 Aphagia: Secondary | ICD-10-CM | POA: Insufficient documentation

## 2016-05-15 DIAGNOSIS — D61818 Other pancytopenia: Secondary | ICD-10-CM | POA: Insufficient documentation

## 2016-05-15 MED ORDER — HEPARIN SOD (PORK) LOCK FLUSH 100 UNIT/ML IV SOLN
500.0000 [IU] | Freq: Once | INTRAVENOUS | Status: AC
  Administered 2016-05-15: 500 [IU] via INTRAVENOUS
  Filled 2016-05-15: qty 5

## 2016-05-15 MED ORDER — SODIUM CHLORIDE 0.9% FLUSH
10.0000 mL | INTRAVENOUS | Status: DC | PRN
  Administered 2016-05-15: 10 mL via INTRAVENOUS
  Filled 2016-05-15: qty 10

## 2016-05-15 NOTE — Progress Notes (Signed)
Survivorship Care Plan visit completed.  Treatment summary reviewed and given to patient.  ASCO answers booklet reviewed and given to patient.  CARE program and Cancer Transitions discussed with patient along with other resources cancer center offers to patients and caregivers.  Patient verbalized understanding.    

## 2016-06-06 ENCOUNTER — Encounter: Payer: Self-pay | Admitting: Emergency Medicine

## 2016-06-06 ENCOUNTER — Emergency Department
Admission: EM | Admit: 2016-06-06 | Discharge: 2016-06-06 | Disposition: A | Discharge status: Home / Self Care | Payer: Medicare Other | Attending: Emergency Medicine | Admitting: Emergency Medicine

## 2016-06-06 DIAGNOSIS — J209 Acute bronchitis, unspecified: Secondary | ICD-10-CM | POA: Diagnosis not present

## 2016-06-06 DIAGNOSIS — Z85118 Personal history of other malignant neoplasm of bronchus and lung: Secondary | ICD-10-CM | POA: Insufficient documentation

## 2016-06-06 DIAGNOSIS — F17210 Nicotine dependence, cigarettes, uncomplicated: Secondary | ICD-10-CM | POA: Diagnosis not present

## 2016-06-06 DIAGNOSIS — Z79899 Other long term (current) drug therapy: Secondary | ICD-10-CM | POA: Insufficient documentation

## 2016-06-06 DIAGNOSIS — J449 Chronic obstructive pulmonary disease, unspecified: Secondary | ICD-10-CM | POA: Insufficient documentation

## 2016-06-06 DIAGNOSIS — I10 Essential (primary) hypertension: Secondary | ICD-10-CM | POA: Diagnosis not present

## 2016-06-06 DIAGNOSIS — R05 Cough: Secondary | ICD-10-CM | POA: Diagnosis present

## 2016-06-06 MED ORDER — HYDROCOD POLST-CPM POLST ER 10-8 MG/5ML PO SUER: 5.0000 mL | Freq: Two times a day (BID) | ORAL | 0 refills | Status: DC | PRN

## 2016-06-06 NOTE — ED Provider Notes (Signed)
[facility] Emergency Department Provider Note  ____________________________________________   First MD Initiated Contact with Patient 06/06/16 1541     (approximate)  I have reviewed the triage vital signs and the nursing notes.   HISTORY  Chief Complaint Cough   HPI Carl Bell is a 65 y.o. male is here with complaint of nonproductive cough for 2 days. Patient denies any fever or chills. He states that the cough is not controlled with over-the-counter medication. Patient currently continues to smoke one pack cigarettes a day despite his diagnosis of lung cancer. He continues to see the cancer center here at [facility]. Patient states he is "cancer free for the last 3 months". He denies any changes in his respirations or difficulty breathing. He states he gets bronchitis every Thanksgiving and this feels the same.At this time he denies any pain.  Patient states he has had multiple chest x-rays at the cancer center and that this does not feel any different than his normal bronchitis.   Past Medical History:  Diagnosis Date  . Cancer ([place])   . COPD (chronic obstructive pulmonary disease) ([place])   . Glaucoma   . Hypertension     Patient Active Problem List   Diagnosis Date Noted  . Acute respiratory distress 12/22/2015  . COPD exacerbation ([place]) 12/22/2015  . Acute bronchitis 12/22/2015  . HTN, goal below 140/80 12/22/2015  . Lung cancer, lingula ([place]) 09/23/2015    Past Surgical History:  Procedure Laterality Date  . LUNG BIOPSY    . PERIPHERAL VASCULAR CATHETERIZATION N/A 09/24/2015   Procedure: [name];  Surgeon: Algernon Huxley, MD;  Location: [facility];  Service: Cardiovascular;  Laterality: N/A;  . Port a cath placement      Prior to Admission medications   Medication Sig Start Date End Date Taking? Authorizing Provider  albuterol (PROVENTIL HFA;VENTOLIN HFA) 108 (90 BASE) MCG/ACT inhaler Inhale 2 puffs into the  lungs every 6 (six) hours as needed for wheezing or shortness of breath. 04/29/15   Loney Hering, MD  amLODipine (NORVASC) 5 MG tablet Take 1 tablet (5 mg total) by mouth daily. 09/17/15 09/16/16  Lloyd Huger, MD  brimonidine (ALPHAGAN) 0.2 % ophthalmic solution Place 1 drop into both eyes 2 (two) times daily.  07/30/15   Historical Provider, MD  chlorpheniramine-HYDROcodone (TUSSIONEX PENNKINETIC ER) 10-8 MG/5ML SUER Take 5 mLs by mouth every 12 (twelve) hours as needed for cough. 06/06/16   Johnn Hai, PA-C  hydrocortisone (ANUSOL-HC) 2.5 % rectal cream Place 1 application rectally 2 (two) times daily. 11/27/15   Noreene Filbert, MD  lidocaine-prilocaine (EMLA) cream Apply 1 application topically as needed. Apply to port 1 hour prior to chemotherapy appointment. Cover with plastic wrap. 10/04/15   Lloyd Huger, MD  megestrol (MEGACE) 40 MG tablet Take 1 tablet (40 mg total) by mouth daily. 10/18/15   Lloyd Huger, MD  mometasone-formoterol (DULERA) 200-5 MCG/ACT AERO Inhale 2 puffs into the lungs 2 (two) times daily. 12/23/15   Fritzi Mandes, MD  potassium chloride SA (K-DUR,KLOR-CON) 20 MEQ tablet Take 1 tablet (20 mEq total) by mouth daily. 11/29/15   Lloyd Huger, MD  prochlorperazine (COMPAZINE) 10 MG tablet Take 1 tablet (10 mg total) by mouth every 6 (six) hours as needed for nausea or vomiting. 10/04/15   Lloyd Huger, MD  sildenafil (REVATIO) 20 MG tablet Take 1 tablet (20 mg total) by mouth daily. 10/16/15   Noreene Filbert,  MD  sucralfate (CARAFATE) 1 g tablet Take 1 tablet (1 g total) by mouth 4 (four) times daily -  with meals and at bedtime. 11/08/15   Noreene Filbert, MD  timolol (BETIMOL) 0.5 % ophthalmic solution Place 1 drop into both eyes daily. 12/23/15   Fritzi Mandes, MD    Allergies Patient has no known allergies.  Family History  Problem Relation Age of Onset  . Diabetes Mellitus II Mother   . Hypertension Father     Social History Social History    Substance Use Topics  . Smoking status: Current Every Day Smoker    Packs/day: 0.50    Years: 50.00    Types: Cigarettes  . Smokeless tobacco: Never Used  . Alcohol use 0.0 oz/week     Comment: 3 pints/week    Review of Systems Constitutional: No fever/chills Eyes: No visual changes. ENT: No sore throat. Cardiovascular: Denies chest pain. Respiratory: Denies shortness of breath. Frequent nonproductive cough. Gastrointestinal: No abdominal pain.  No nausea, no vomiting.   Musculoskeletal: Negative for back pain. Skin: Negative for rash. Neurological: Negative for headaches, focal weakness or numbness.  10-point ROS otherwise negative.  ____________________________________________   PHYSICAL EXAM:  VITAL SIGNS: ED Triage Vitals  Enc Vitals Group     BP 06/06/16 1527 (!) 139/91     Pulse Rate 06/06/16 1527 94     Resp 06/06/16 1527 20     Temp 06/06/16 1527 98.2 F (36.8 C)     Temp Source 06/06/16 1527 Oral     SpO2 06/06/16 1527 100 %     Weight 06/06/16 1527 150 lb (68 kg)     Height 06/06/16 1527 '6\' 1"'$  (1.854 m)     Head Circumference --      Peak Flow --      Pain Score 06/06/16 1528 0     Pain Loc --      Pain Edu? --      Excl. in [place]? --     Constitutional: Alert and oriented. Well appearing and in no acute distress. Eyes: Conjunctivae are normal. PERRL. EOMI. Head: Atraumatic. Nose: No congestion/rhinnorhea.  EACs and TMs are clear bilaterally. Mouth/Throat: Mucous membranes are moist.  Oropharynx non-erythematous. Neck: No stridor.   Cardiovascular: Normal rate, regular rhythm. Grossly normal heart sounds.  Good peripheral circulation. Respiratory: Normal respiratory effort.  No retractions. Lungs CTAB. Frequent nonproductive cough without any respiratory distress. Gastrointestinal: Soft and nontender. No distention. No abdominal bruits. No CVA tenderness. Musculoskeletal: No lower extremity tenderness nor edema.  No joint effusions. Neurologic:   Normal speech and language. No gross focal neurologic deficits are appreciated. No gait instability. Skin:  Skin is warm, dry and intact. No rash noted. Psychiatric: Mood and affect are normal. Speech and behavior are normal.  ____________________________________________   LABS (all labs ordered are listed, but only abnormal results are displayed)  Labs Reviewed - No data to display  RADIOLOGY  Deferred ____________________________________________   PROCEDURES  Procedure(s) performed: None  Procedures  Critical Care performed: No  ____________________________________________   INITIAL IMPRESSION / ASSESSMENT AND PLAN / ED COURSE  Pertinent labs & imaging results that were available during my care of the patient were reviewed by me and considered in my medical decision making (see chart for details).    Clinical Course    Patient was given a prescription for Tussionex 1 teaspoon twice a day as needed for cough. He is made aware that this medication could cause drowsiness and increase  his risk for falling.  He is follow-up with his primary care doctor if any continued problems he was told that he may return to the emergency room over the weekend if any severe worsening of his symptoms, fever, chills, or difficulty breathing.  ____________________________________________   FINAL CLINICAL IMPRESSION(S) / ED DIAGNOSES  Final diagnoses:  Acute bronchitis, unspecified organism      NEW MEDICATIONS STARTED DURING THIS VISIT:  New Prescriptions   CHLORPHENIRAMINE-HYDROCODONE (TUSSIONEX PENNKINETIC ER) 10-8 MG/5ML SUER    Take 5 mLs by mouth every 12 (twelve) hours as needed for cough.     Note:  This document was prepared using Dragon voice recognition software and may include unintentional dictation errors.    Johnn Hai, PA-C 06/06/16 1606    Nance Pear, MD 06/06/16 1739

## 2016-06-06 NOTE — ED Triage Notes (Signed)
Pt has hx of lung cancer, reports he's cancer free but last tx was 3 months ago.

## 2016-06-06 NOTE — ED Triage Notes (Signed)
Pt reports dry cough x4 days. Pt is smoker.

## 2016-06-06 NOTE — Discharge Instructions (Signed)
Discontinue smoking or decrease the amount. Increase fluids. Take Tussionex as needed for cough. Follow-up with Dr. Grayland Ormond if any continued problems on Monday. Return to the emergency room over the weekend if any severe worsening of your cough, fever, chills, or difficulty breathing.

## 2016-06-10 ENCOUNTER — Inpatient Hospital Stay: Payer: Medicare Other

## 2016-06-10 ENCOUNTER — Ambulatory Visit: Admission: RE | Admit: 2016-06-10 | Payer: Medicare Other | Source: Ambulatory Visit

## 2016-06-16 ENCOUNTER — Inpatient Hospital Stay: Payer: Medicare Other | Admitting: Oncology

## 2016-06-19 ENCOUNTER — Inpatient Hospital Stay: Payer: Medicare Other

## 2016-06-19 ENCOUNTER — Inpatient Hospital Stay: Payer: Medicare Other | Attending: Oncology

## 2016-06-19 ENCOUNTER — Ambulatory Visit
Admission: RE | Admit: 2016-06-19 | Discharge: 2016-06-19 | Disposition: A | Discharge status: Home / Self Care | Payer: Medicare Other | Source: Ambulatory Visit | Attending: Radiation Oncology | Admitting: Radiation Oncology

## 2016-06-19 DIAGNOSIS — R130 Aphagia: Secondary | ICD-10-CM | POA: Diagnosis not present

## 2016-06-19 DIAGNOSIS — Z95828 Presence of other vascular implants and grafts: Secondary | ICD-10-CM

## 2016-06-19 DIAGNOSIS — H409 Unspecified glaucoma: Secondary | ICD-10-CM | POA: Insufficient documentation

## 2016-06-19 DIAGNOSIS — D61818 Other pancytopenia: Secondary | ICD-10-CM | POA: Insufficient documentation

## 2016-06-19 DIAGNOSIS — D6481 Anemia due to antineoplastic chemotherapy: Secondary | ICD-10-CM | POA: Insufficient documentation

## 2016-06-19 DIAGNOSIS — Z923 Personal history of irradiation: Secondary | ICD-10-CM | POA: Diagnosis not present

## 2016-06-19 DIAGNOSIS — J449 Chronic obstructive pulmonary disease, unspecified: Secondary | ICD-10-CM | POA: Insufficient documentation

## 2016-06-19 DIAGNOSIS — I10 Essential (primary) hypertension: Secondary | ICD-10-CM | POA: Diagnosis not present

## 2016-06-19 DIAGNOSIS — Z79899 Other long term (current) drug therapy: Secondary | ICD-10-CM | POA: Insufficient documentation

## 2016-06-19 DIAGNOSIS — F17210 Nicotine dependence, cigarettes, uncomplicated: Secondary | ICD-10-CM | POA: Insufficient documentation

## 2016-06-19 DIAGNOSIS — C3412 Malignant neoplasm of upper lobe, left bronchus or lung: Secondary | ICD-10-CM | POA: Insufficient documentation

## 2016-06-19 LAB — COMPREHENSIVE METABOLIC PANEL
ALK PHOS: 57 U/L (ref 38–126)
ALT: 9 U/L — AB (ref 17–63)
AST: 22 U/L (ref 15–41)
Albumin: 2.7 g/dL — ABNORMAL LOW (ref 3.5–5.0)
Anion gap: 9 (ref 5–15)
BILIRUBIN TOTAL: 0.4 mg/dL (ref 0.3–1.2)
BUN: 9 mg/dL (ref 6–20)
CALCIUM: 8.4 mg/dL — AB (ref 8.9–10.3)
CHLORIDE: 105 mmol/L (ref 101–111)
CO2: 22 mmol/L (ref 22–32)
CREATININE: 0.57 mg/dL — AB (ref 0.61–1.24)
GFR calc Af Amer: >60 mL/min (ref >60)
Glucose, Bld: 76 mg/dL (ref 65–99)
Potassium: 3.8 mmol/L (ref 3.5–5.1)
Sodium: 136 mmol/L (ref 135–145)
TOTAL PROTEIN: 6 g/dL — AB (ref 6.5–8.1)

## 2016-06-19 LAB — CBC WITH DIFFERENTIAL/PLATELET
BASOS ABS: 0 10*3/uL (ref 0–0.1)
Basophils Relative: 1 %
Eosinophils Absolute: 0.1 10*3/uL (ref 0–0.7)
Eosinophils Relative: 3 %
HEMATOCRIT: 30 % — AB (ref 40.0–52.0)
HEMOGLOBIN: 9.9 g/dL — AB (ref 13.0–18.0)
LYMPHS ABS: 0.6 10*3/uL — AB (ref 1.0–3.6)
LYMPHS PCT: 13 %
MCH: 30.2 pg (ref 26.0–34.0)
MCHC: 33 g/dL (ref 32.0–36.0)
MCV: 91.7 fL (ref 80.0–100.0)
Monocytes Absolute: 0.5 10*3/uL (ref 0.2–1.0)
Monocytes Relative: 12 %
NEUTROS ABS: 3 10*3/uL (ref 1.4–6.5)
Neutrophils Relative %: 71 %
Platelets: 202 10*3/uL (ref 150–440)
RBC: 3.27 MIL/uL — AB (ref 4.40–5.90)
RDW: 14 % (ref 11.5–14.5)
WBC: 4.3 10*3/uL (ref 3.8–10.6)

## 2016-06-19 MED ORDER — SODIUM CHLORIDE 0.9% FLUSH
10.0000 mL | INTRAVENOUS | Status: DC | PRN
  Administered 2016-06-19: 10 mL via INTRAVENOUS
  Filled 2016-06-19: qty 10

## 2016-06-19 MED ORDER — HEPARIN SOD (PORK) LOCK FLUSH 100 UNIT/ML IV SOLN: 500.0000 [IU] | Freq: Once | INTRAVENOUS | Status: DC

## 2016-06-19 MED ORDER — IOPAMIDOL (ISOVUE-300) INJECTION 61%
75.0000 mL | Freq: Once | INTRAVENOUS | Status: AC | PRN
  Administered 2016-06-19: 75 mL via INTRAVENOUS

## 2016-06-19 MED ORDER — HEPARIN SOD (PORK) LOCK FLUSH 10 UNIT/ML IV SOLN
50.0000 [IU] | Freq: Once | INTRAVENOUS | Status: AC
  Administered 2016-06-19: 50 [IU] via INTRAVENOUS
  Filled 2016-06-19: qty 5

## 2016-06-24 NOTE — Progress Notes (Signed)
[facility]  [phone]) [phone] Fax:(336) [phone]  ID: Carl Bell OB: 09-30-1950  MR#: [id number]  AOZ#:[id number]  Patient Care Team: Lloyd Huger, MD as PCP - General (Oncology) Leona Singleton, RN as Oncology Nurse Navigator  CHIEF COMPLAINT: Stage IIIa squamous cell carcinoma of the lingula of lung.  INTERVAL HISTORY: Patient returns to clinic today for further evaluation and discussion of his CT scan results from 06/19/2016. He continues to feel well and is asymptomatic. His appetite, weakness and fatigue have improved. He continues to have a persistent cough with occasional dyspnea on exertion. He recently was seen in the emergency department for shortness of breath and was diagnosed with bronchitis and sent home with Tussinex. He feels much better today. He has no neurologic complaints. He denies any recent fevers. He denies chest pain. He denies any hemoptysis.  He denies any nausea, vomiting, constipation, or diarrhea. He has no urinary complaints. Patient offers no further specific complaints  REVIEW OF SYSTEMS:   Review of Systems  Constitutional: Negative for fever, malaise/fatigue and weight loss.  Respiratory: Positive for cough and shortness of breath. Negative for hemoptysis.   Cardiovascular: Negative.  Negative for chest pain.  Gastrointestinal: Negative.  Negative for abdominal pain.  Musculoskeletal: Negative.   Neurological: Positive for sensory change. Negative for weakness.  Endo/Heme/Allergies: Does not bruise/bleed easily.  Psychiatric/Behavioral: The patient is not nervous/anxious.     As per HPI. Otherwise, a complete review of systems is negative.  PAST MEDICAL HISTORY: Past Medical History:  Diagnosis Date  . Cancer ([place])   . COPD (chronic obstructive pulmonary disease) ([place])   . Glaucoma   . Hypertension     PAST SURGICAL HISTORY: Past Surgical History:  Procedure Laterality Date  . LUNG BIOPSY    . PERIPHERAL  VASCULAR CATHETERIZATION N/A 09/24/2015   Procedure: [name];  Surgeon: Algernon Huxley, MD;  Location: [facility];  Service: Cardiovascular;  Laterality: N/A;  . Port a cath placement      FAMILY HISTORY: Reviewed and unchanged. No reported history of malignancy or chronic disease.     ADVANCED DIRECTIVES:    HEALTH MAINTENANCE: Social History  Substance Use Topics  . Smoking status: Current Every Day Smoker    Packs/day: 0.50    Years: 50.00    Types: Cigarettes  . Smokeless tobacco: Never Used  . Alcohol use 0.0 oz/week     Comment: 3 pints/week     No Known Allergies  Current Outpatient Prescriptions  Medication Sig Dispense Refill  . albuterol (PROVENTIL HFA;VENTOLIN HFA) 108 (90 BASE) MCG/ACT inhaler Inhale 2 puffs into the lungs every 6 (six) hours as needed for wheezing or shortness of breath. 1 Inhaler 0  . brimonidine (ALPHAGAN) 0.2 % ophthalmic solution Place 1 drop into both eyes 2 (two) times daily.   4  . lidocaine-prilocaine (EMLA) cream Apply 1 application topically as needed. Apply to port 1 hour prior to chemotherapy appointment. Cover with plastic wrap. 30 g 2  . megestrol (MEGACE) 40 MG tablet Take 1 tablet (40 mg total) by mouth daily. 30 tablet 1  . timolol (BETIMOL) 0.5 % ophthalmic solution Place 1 drop into both eyes daily. 10 mL 12   No current facility-administered medications for this visit.     OBJECTIVE: Vitals:   06/25/16 1205  BP: (!) 167/103  Pulse: (!) 125  Resp: 18  Temp: 98.3 F (36.8 C)     Body mass index is 19.36 kg/m.  ECOG FS:0 - Asymptomatic  General: Well-developed, well-nourished, no acute distress. Eyes: Pink conjunctiva, anicteric sclera. Lungs: Clear to auscultation bilaterally. Heart: Regular rate and rhythm. No rubs, murmurs, or gallops. Abdomen: Soft, nontender, nondistended. No organomegaly noted, normoactive bowel sounds. Musculoskeletal: No edema, cyanosis, or clubbing. Neuro: Alert,  answering all questions appropriately. Cranial nerves grossly intact. Skin: No rashes or petechiae noted. Psych: Normal affect.   LAB RESULTS:  Lab Results  Component Value Date   NA 136 06/19/2016   K 3.8 06/19/2016   CL 105 06/19/2016   CO2 22 06/19/2016   GLUCOSE 76 06/19/2016   BUN 9 06/19/2016   CREATININE 0.57 (L) 06/19/2016   CALCIUM 8.4 (L) 06/19/2016   PROT 6.0 (L) 06/19/2016   ALBUMIN 2.7 (L) 06/19/2016   AST 22 06/19/2016   ALT 9 (L) 06/19/2016   ALKPHOS 57 06/19/2016   BILITOT 0.4 06/19/2016   GFRNONAA >60 06/19/2016   GFRAA >60 06/19/2016    Lab Results  Component Value Date   WBC 4.3 06/19/2016   NEUTROABS 3.0 06/19/2016   HGB 9.9 (L) 06/19/2016   HCT 30.0 (L) 06/19/2016   MCV 91.7 06/19/2016   PLT 202 06/19/2016     STUDIES: Ct Chest W Contrast  Result Date: 06/19/2016 CLINICAL DATA:  Left lung cancer restaging. Status post radiation chemotherapy. EXAM: CT CHEST WITH CONTRAST TECHNIQUE: Multidetector CT imaging of the chest was performed during intravenous contrast administration. CONTRAST:  67m ISOVUE-300 IOPAMIDOL (ISOVUE-300) INJECTION 61% COMPARISON:  PET-CT 02/28/2016.  Chest CT 08/27/2015 FINDINGS: Cardiovascular: Heart size normal. Trace pericardial effusion evident. Coronary artery calcification is noted. Atherosclerotic calcification is noted in the wall of the thoracic aorta. Mediastinum/Nodes: No mediastinal lymphadenopathy. There is no hilar lymphadenopathy. The esophagus has normal imaging features. There is no axillary lymphadenopathy. Right Port-A-Cath tip is positioned in the mid SVC. Lungs/Pleura: Centrilobular and paraseptal emphysema again noted. 3.6 cm mass in the lingula seen 08/27/2015 reduced to 1.9 x 0.9 cm on 02/28/2016. Today's exam shows interstitial and confluent airspace disease in the lingula, presumably reflecting radiation fibrosis. The nodule seen previously has been incorporated into the confluent alveolar consolidation.  Interval development of ill-defined nodular opacity in the posterior right lower lobe with a somewhat bronchovascular distribution. Dominant lesion measures up to 2.1 x 3.3 cm with an adjacent 1.8 cm nodular component. Upper Abdomen: 3.3 cm rim calcified left renal lesion incompletely visualized. This showed no hypermetabolism on prior PET imaging. Musculoskeletal: Bone windows reveal no worrisome lytic or sclerotic osseous lesions. IMPRESSION: Interval development of relatively diffuse interstitial and alveolar opacity in the lingula, presumably representing post radiation change. Previously seen lingular nodule has been incorporated into this confluent opacity. Interval development of multifocal nodular airspace disease posterior right lower lobe. Imaging features may be related to aspiration and/ or infection, but neoplasm cannot be excluded. Electronically Signed   By: EMisty StanleyM.D.   On: 06/19/2016 13:48    ASSESSMENT: Stage IIIa squamous cell carcinoma of the lingula of lung.  PLAN:    1. Stage IIIa squamous cell carcinoma of the lingula of lung:  Patient completed his concurrent chemotherapy and XRT. Patient only received one infusion of consolidation chemotherapy on December 27, 2015, but this was discontinued secondary to persistent pancytopenia. Repeat CT scan on June 19, 2016 with no obvious evidence of recurrent disease, but did reveal "multifocal nodular airspace disease posterior right lower lobe. Features may be related to aspiration and/ or infection, but neoplasm cannot be excluded." Given his recent diagnosis  of bronchitis, will repeat CT scan in three months for further evaluation.  2. Anemia: Secondary to chemotherapy. Improved. Blood transfusion given in July.  3. Thrombocytopenia: Resolved. 5. Leukopenia: Resolved. 6. Weakness and fatigue: Improved. 7. Shortness of breath/cough: Chronic and unchanged, monitor.  Patient expressed understanding and was in agreement with this  plan. He also understands that He can call clinic at any time with any questions, concerns, or complaints.   Jacquelin Hawking, NP 06/25/2016  2:00 PM  Patient was seen and evaluated independently and I agree with the assessment and plan as dictated above. Continue Tussionex for cough. Repeat CT scan in 3 months as above.  Lloyd Huger, MD 06/28/16 11:10 AM

## 2016-06-25 ENCOUNTER — Inpatient Hospital Stay (HOSPITAL_BASED_OUTPATIENT_CLINIC_OR_DEPARTMENT_OTHER): Payer: Medicare Other | Admitting: Oncology

## 2016-06-25 ENCOUNTER — Inpatient Hospital Stay: Payer: Medicare Other

## 2016-06-25 VITALS — BP 167/103 | HR 125 | Temp 98.3°F | Resp 18 | Wt 146.7 lb

## 2016-06-25 DIAGNOSIS — C3412 Malignant neoplasm of upper lobe, left bronchus or lung: Secondary | ICD-10-CM

## 2016-06-25 DIAGNOSIS — D61818 Other pancytopenia: Secondary | ICD-10-CM

## 2016-06-25 DIAGNOSIS — F17210 Nicotine dependence, cigarettes, uncomplicated: Secondary | ICD-10-CM

## 2016-06-25 DIAGNOSIS — D6481 Anemia due to antineoplastic chemotherapy: Secondary | ICD-10-CM

## 2016-06-25 DIAGNOSIS — Z79899 Other long term (current) drug therapy: Secondary | ICD-10-CM

## 2016-06-25 DIAGNOSIS — R130 Aphagia: Secondary | ICD-10-CM

## 2016-06-25 DIAGNOSIS — Z923 Personal history of irradiation: Secondary | ICD-10-CM

## 2016-06-25 DIAGNOSIS — C3410 Malignant neoplasm of upper lobe, unspecified bronchus or lung: Secondary | ICD-10-CM

## 2016-06-25 NOTE — Progress Notes (Signed)
Offers no complaints  

## 2016-08-05 ENCOUNTER — Encounter: Payer: Self-pay | Admitting: *Deleted

## 2016-09-24 ENCOUNTER — Ambulatory Visit
Admission: RE | Admit: 2016-09-24 | Discharge: 2016-09-24 | Disposition: A | Discharge status: Home / Self Care | Payer: Medicare Other | Source: Ambulatory Visit | Attending: Oncology | Admitting: Oncology

## 2016-09-24 ENCOUNTER — Inpatient Hospital Stay: Payer: Medicare Other

## 2016-09-24 ENCOUNTER — Ambulatory Visit: Payer: Medicare Other

## 2016-09-24 ENCOUNTER — Inpatient Hospital Stay: Payer: Medicare Other | Attending: Oncology

## 2016-09-24 DIAGNOSIS — J439 Emphysema, unspecified: Secondary | ICD-10-CM | POA: Diagnosis not present

## 2016-09-24 DIAGNOSIS — R0602 Shortness of breath: Secondary | ICD-10-CM | POA: Insufficient documentation

## 2016-09-24 DIAGNOSIS — Z923 Personal history of irradiation: Secondary | ICD-10-CM | POA: Insufficient documentation

## 2016-09-24 DIAGNOSIS — H409 Unspecified glaucoma: Secondary | ICD-10-CM | POA: Diagnosis not present

## 2016-09-24 DIAGNOSIS — F17210 Nicotine dependence, cigarettes, uncomplicated: Secondary | ICD-10-CM | POA: Insufficient documentation

## 2016-09-24 DIAGNOSIS — Z9221 Personal history of antineoplastic chemotherapy: Secondary | ICD-10-CM | POA: Insufficient documentation

## 2016-09-24 DIAGNOSIS — I10 Essential (primary) hypertension: Secondary | ICD-10-CM | POA: Diagnosis not present

## 2016-09-24 DIAGNOSIS — J449 Chronic obstructive pulmonary disease, unspecified: Secondary | ICD-10-CM | POA: Diagnosis not present

## 2016-09-24 DIAGNOSIS — I2510 Atherosclerotic heart disease of native coronary artery without angina pectoris: Secondary | ICD-10-CM | POA: Diagnosis not present

## 2016-09-24 DIAGNOSIS — Z85118 Personal history of other malignant neoplasm of bronchus and lung: Secondary | ICD-10-CM | POA: Insufficient documentation

## 2016-09-24 DIAGNOSIS — C3410 Malignant neoplasm of upper lobe, unspecified bronchus or lung: Secondary | ICD-10-CM

## 2016-09-24 DIAGNOSIS — D61818 Other pancytopenia: Secondary | ICD-10-CM | POA: Diagnosis not present

## 2016-09-24 DIAGNOSIS — D649 Anemia, unspecified: Secondary | ICD-10-CM | POA: Insufficient documentation

## 2016-09-24 LAB — CBC WITH DIFFERENTIAL/PLATELET
BASOS ABS: 0 10*3/uL (ref 0–0.1)
BASOS PCT: 1 %
EOS PCT: 6 %
Eosinophils Absolute: 0.2 10*3/uL (ref 0–0.7)
HEMATOCRIT: 37.4 % — AB (ref 40.0–52.0)
Hemoglobin: 12.4 g/dL — ABNORMAL LOW (ref 13.0–18.0)
Lymphocytes Relative: 15 %
Lymphs Abs: 0.5 10*3/uL — ABNORMAL LOW (ref 1.0–3.6)
MCH: 28.3 pg (ref 26.0–34.0)
MCHC: 33 g/dL (ref 32.0–36.0)
MCV: 85.8 fL (ref 80.0–100.0)
MONO ABS: 0.5 10*3/uL (ref 0.2–1.0)
Monocytes Relative: 16 %
Neutro Abs: 2.2 10*3/uL (ref 1.4–6.5)
Neutrophils Relative %: 62 %
PLATELETS: 167 10*3/uL (ref 150–440)
RBC: 4.36 MIL/uL — ABNORMAL LOW (ref 4.40–5.90)
RDW: 16.2 % — AB (ref 11.5–14.5)
WBC: 3.5 10*3/uL — ABNORMAL LOW (ref 3.8–10.6)

## 2016-09-24 LAB — COMPREHENSIVE METABOLIC PANEL
ALBUMIN: 3.3 g/dL — AB (ref 3.5–5.0)
ALT: 11 U/L — ABNORMAL LOW (ref 17–63)
ANION GAP: 8 (ref 5–15)
AST: 24 U/L (ref 15–41)
Alkaline Phosphatase: 61 U/L (ref 38–126)
BILIRUBIN TOTAL: 0.6 mg/dL (ref 0.3–1.2)
BUN: 11 mg/dL (ref 6–20)
CHLORIDE: 104 mmol/L (ref 101–111)
CO2: 25 mmol/L (ref 22–32)
Calcium: 8.4 mg/dL — ABNORMAL LOW (ref 8.9–10.3)
Creatinine, Ser: 0.86 mg/dL (ref 0.61–1.24)
GFR calc Af Amer: >60 mL/min (ref >60)
Glucose, Bld: 88 mg/dL (ref 65–99)
POTASSIUM: 3.7 mmol/L (ref 3.5–5.1)
Sodium: 137 mmol/L (ref 135–145)
TOTAL PROTEIN: 6.6 g/dL (ref 6.5–8.1)

## 2016-09-24 MED ORDER — IOPAMIDOL (ISOVUE-300) INJECTION 61%
75.0000 mL | Freq: Once | INTRAVENOUS | Status: AC | PRN
  Administered 2016-09-24: 75 mL via INTRAVENOUS

## 2016-09-28 NOTE — Progress Notes (Signed)
[name]  Telephone:(336) [phone] Fax:(336) [phone]  ID: Carl Bell OB: [birth year]  MR#: [id number]  IOX#:[id number]  Patient Care Team: No Pcp Per Patient as PCP - General (General Practice) Carl Singleton, RN as Oncology Nurse Navigator  CHIEF COMPLAINT: Stage IIIa squamous cell carcinoma of the lingula of lung.  INTERVAL HISTORY: Patient returns to clinic today for further evaluation and discussion of his imaging results. He continues to feel well and is asymptomatic. He continues to have chronic shortness of breath and cough. He has no neurologic complaints. He denies any recent fevers. He denies chest pain. He denies any hemoptysis.  He denies any nausea, vomiting, constipation, or diarrhea. He has no urinary complaints. Patient offers no further specific complaints  REVIEW OF SYSTEMS:   Review of Systems  Constitutional: Negative for fever, malaise/fatigue and weight loss.  Respiratory: Positive for cough and shortness of breath. Negative for hemoptysis.   Cardiovascular: Negative.  Negative for chest pain.  Gastrointestinal: Negative.  Negative for abdominal pain.  Musculoskeletal: Negative.   Neurological: Positive for sensory change. Negative for weakness.  Endo/Heme/Allergies: Does not bruise/bleed easily.  Psychiatric/Behavioral: The patient is not nervous/anxious.     As per HPI. Otherwise, a complete review of systems is negative.  PAST MEDICAL HISTORY: Past Medical History:  Diagnosis Date  . Cancer ([place])   . COPD (chronic obstructive pulmonary disease) ([place])   . Glaucoma   . Hypertension     PAST SURGICAL HISTORY: Past Surgical History:  Procedure Laterality Date  . LUNG BIOPSY    . PERIPHERAL VASCULAR CATHETERIZATION N/A 09/24/2015   Procedure: Carl Bell Cath Insertion;  Surgeon: Algernon Huxley, MD;  Location: [facility];  Service: Cardiovascular;  Laterality: N/A;  . Port a cath placement      FAMILY HISTORY: Reviewed and  unchanged. No reported history of malignancy or chronic disease.     ADVANCED DIRECTIVES:    HEALTH MAINTENANCE: Social History  Substance Use Topics  . Smoking status: Current Every Day Smoker    Packs/day: 0.50    Years: 50.00    Types: Cigarettes  . Smokeless tobacco: Never Used  . Alcohol use 0.0 oz/week     Comment: 3 pints/week     No Known Allergies  Current Outpatient Prescriptions  Medication Sig Dispense Refill  . albuterol (PROVENTIL HFA;VENTOLIN HFA) 108 (90 BASE) MCG/ACT inhaler Inhale 2 puffs into the lungs every 6 (six) hours as needed for wheezing or shortness of breath. 1 Inhaler 0  . brimonidine (ALPHAGAN) 0.2 % ophthalmic solution Place 1 drop into both eyes 2 (two) times daily.   4  . lidocaine-prilocaine (EMLA) cream Apply 1 application topically as needed. Apply to port 1 hour prior to chemotherapy appointment. Cover with plastic wrap. 30 g 2  . megestrol (MEGACE) 40 MG tablet Take 1 tablet (40 mg total) by mouth daily. (Patient not taking: Reported on 09/30/2016) 30 tablet 1   No current facility-administered medications for this visit.     OBJECTIVE: Vitals:   09/30/16 1043  BP: (!) 151/90  Pulse: 85  Resp: 18  Temp: 97.2 F (36.2 C)     Body mass index is 19.86 kg/m.    ECOG FS:0 - Asymptomatic  General: Well-developed, well-nourished, no acute distress. Eyes: Pink conjunctiva, anicteric sclera. Lungs: Clear to auscultation bilaterally. Heart: Regular rate and rhythm. No rubs, murmurs, or gallops. Abdomen: Soft, nontender, nondistended. No organomegaly noted, normoactive bowel sounds. Musculoskeletal: No edema, cyanosis, or clubbing. Neuro:  Alert, answering all questions appropriately. Cranial nerves grossly intact. Skin: No rashes or petechiae noted. Psych: Normal affect.   LAB RESULTS:  Lab Results  Component Value Date   NA 137 09/24/2016   K 3.7 09/24/2016   CL 104 09/24/2016   CO2 25 09/24/2016   GLUCOSE 88 09/24/2016   BUN 11  09/24/2016   CREATININE 0.86 09/24/2016   CALCIUM 8.4 (L) 09/24/2016   PROT 6.6 09/24/2016   ALBUMIN 3.3 (L) 09/24/2016   AST 24 09/24/2016   ALT 11 (L) 09/24/2016   ALKPHOS 61 09/24/2016   BILITOT 0.6 09/24/2016   GFRNONAA >60 09/24/2016   GFRAA >60 09/24/2016    Lab Results  Component Value Date   WBC 3.5 (L) 09/24/2016   NEUTROABS 2.2 09/24/2016   HGB 12.4 (L) 09/24/2016   HCT 37.4 (L) 09/24/2016   MCV 85.8 09/24/2016   PLT 167 09/24/2016     STUDIES: Ct Chest W Contrast  Result Date: 09/24/2016 CLINICAL DATA:  Stage IIIA squamous cell carcinoma of the lingula EXAM: CT CHEST WITH CONTRAST TECHNIQUE: Multidetector CT imaging of the chest was performed during intravenous contrast administration. CONTRAST:  6m ISOVUE-300 IOPAMIDOL (ISOVUE-300) INJECTION 61% COMPARISON:  06/19/2016 FINDINGS: Cardiovascular: Heart size normal. Tiny pericardial effusion slightly progressed in the interval. Coronary artery calcification is noted. Atherosclerotic calcification is noted in the wall of the thoracic aorta. Right Port-A-Cath tip is positioned in the distal SVC. Mediastinum/Nodes: No mediastinal lymphadenopathy. There is no hilar lymphadenopathy. The esophagus has normal imaging features. There is no axillary lymphadenopathy. Lungs/Pleura: Centrilobular paraseptal emphysema noted. The posterior right lower lobe disease seen previously has almost completely resolved in the interval. Evolution of the post treatment changes in the lingula noted with some resolution of interstitial and alveolar opacity and interval progression of volume loss in the inferior lingula. No focal airspace consolidation. No pulmonary edema or pleural effusion. No new or suspicious pulmonary nodule or mass. Upper Abdomen: Low-density lesion upper pole right kidney incompletely visualized, but this was assessed on PET-CT of 02/28/2016 as non hypermetabolic. Calcified lesion interpolar left kidney incompletely seen on today's  study but also found knee without hypermetabolism on the prior PET-CT. Musculoskeletal: Bone windows reveal no worrisome lytic or sclerotic osseous lesions. IMPRESSION: 1. Interval near complete resolution of the right lower lobe disease seen previously. This is most likely due to resolution of infectious/inflammatory etiology with some trace residual sequelae or scar remaining at this time. 2. Interval evolution of post radiation changes in the lingula with development of some a interval further volume loss. 3. No new or progressive findings. 4.  Emphysema. (ICD10-J43.9) 5. Coronary artery atherosclerosis. Electronically Signed   By: EMisty StanleyM.D.   On: 09/24/2016 13:29    ASSESSMENT: Stage IIIa squamous cell carcinoma of the lingula of lung.  PLAN:    1. 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 Patient only received one infusion of consolidation chemotherapy on December 27, 2015, but this was discontinued secondary to persistent pancytopenia. Repeat CT scan on September 24, 2016 reviewed independently and reported as above with no obvious evidence of recurrence or progression of disease. Patient will return to clinic in mid June for repeat imaging and further evaluation. We will continue imaging approximately every 6 month until June 2019 when patient is 2 years removed from completing his treatment at which time he can be switched to yearly imaging. 2. Anemia: Mild, monitor. 3. Shortness of breath/cough:  Chronic and unchanged, monitor.  Patient expressed understanding and was in agreement with this plan. He also understands that He can call clinic at any time with any questions, concerns, or complaints.   Lloyd Huger, MD 10/05/16 9:28 AM

## 2016-09-30 ENCOUNTER — Inpatient Hospital Stay (HOSPITAL_BASED_OUTPATIENT_CLINIC_OR_DEPARTMENT_OTHER): Payer: Medicare Other | Admitting: Oncology

## 2016-09-30 ENCOUNTER — Encounter: Payer: Self-pay | Admitting: Oncology

## 2016-09-30 VITALS — BP 151/90 | HR 85 | Temp 97.2°F | Resp 18 | Ht 73.0 in | Wt 150.5 lb

## 2016-09-30 DIAGNOSIS — C3410 Malignant neoplasm of upper lobe, unspecified bronchus or lung: Secondary | ICD-10-CM

## 2016-09-30 DIAGNOSIS — R0602 Shortness of breath: Secondary | ICD-10-CM | POA: Diagnosis not present

## 2016-09-30 DIAGNOSIS — Z9221 Personal history of antineoplastic chemotherapy: Secondary | ICD-10-CM | POA: Diagnosis not present

## 2016-09-30 DIAGNOSIS — F17210 Nicotine dependence, cigarettes, uncomplicated: Secondary | ICD-10-CM | POA: Diagnosis not present

## 2016-09-30 DIAGNOSIS — Z923 Personal history of irradiation: Secondary | ICD-10-CM

## 2016-09-30 DIAGNOSIS — D649 Anemia, unspecified: Secondary | ICD-10-CM

## 2016-09-30 DIAGNOSIS — Z85118 Personal history of other malignant neoplasm of bronchus and lung: Secondary | ICD-10-CM

## 2016-09-30 DIAGNOSIS — D61818 Other pancytopenia: Secondary | ICD-10-CM

## 2016-09-30 NOTE — Progress Notes (Signed)
Pt doing well no c/o. Not taking the megace.

## 2016-10-01 ENCOUNTER — Ambulatory Visit: Payer: Medicare Other | Admitting: Oncology

## 2016-10-01 ENCOUNTER — Ambulatory Visit: Payer: Medicare Other | Admitting: Radiation Oncology

## 2016-10-29 ENCOUNTER — Encounter: Payer: Self-pay | Admitting: Radiation Oncology

## 2016-10-29 ENCOUNTER — Ambulatory Visit
Admission: RE | Admit: 2016-10-29 | Discharge: 2016-10-29 | Disposition: A | Discharge status: Home / Self Care | Payer: Medicare Other | Source: Ambulatory Visit | Attending: Radiation Oncology | Admitting: Radiation Oncology

## 2016-10-29 VITALS — BP 150/81 | HR 116 | Temp 95.5°F | Resp 20 | Wt 154.1 lb

## 2016-10-29 DIAGNOSIS — Z9221 Personal history of antineoplastic chemotherapy: Secondary | ICD-10-CM | POA: Diagnosis not present

## 2016-10-29 DIAGNOSIS — C3412 Malignant neoplasm of upper lobe, left bronchus or lung: Secondary | ICD-10-CM | POA: Insufficient documentation

## 2016-10-29 DIAGNOSIS — F17210 Nicotine dependence, cigarettes, uncomplicated: Secondary | ICD-10-CM | POA: Insufficient documentation

## 2016-10-29 DIAGNOSIS — Z923 Personal history of irradiation: Secondary | ICD-10-CM | POA: Insufficient documentation

## 2016-10-29 NOTE — Progress Notes (Signed)
Radiation Oncology Follow up Note  Name: Carl Bell   Date:   10/29/2016 MRN:  [id number] DOB: Dec 01, 1950    This 66 y.o. male presents to the clinic today for ten-month follow-up status post radiation therapy with concurrent chemotherapy for stage IIIa squamous cell carcinoma the left upper lobe.  REFERRING PROVIDER: Lloyd Huger, MD  HPI: Patient is a 66 year old male now out 10 months having completed combined modality treatment for stage IIIa (T2 N2 M0) squamous cell carcinoma the left upper lobe. He is seen today in routine follow-up is doing well. He specifically denies cough hemoptysis or chest tightness. He recently had a  CT scan which shows. No evidence of disease with complete response. He is being closely watched by medical oncology for possible consolidative chemotherapy.  COMPLICATIONS OF TREATMENT: none  FOLLOW UP COMPLIANCE: keeps appointments   PHYSICAL EXAM:  BP (!) 150/81   Pulse (!) 116   Temp (!) 95.5 F (35.3 C)   Resp 20   Wt 154 lb 1.6 oz (69.9 kg)   BMI 20.33 kg/m  Well-developed well-nourished patient in NAD. HEENT reveals PERLA, EOMI, discs not visualized.  Oral cavity is clear. No oral mucosal lesions are identified. Neck is clear without evidence of cervical or supraclavicular adenopathy. Lungs are clear to A&P. Cardiac examination is essentially unremarkable with regular rate and rhythm without murmur rub or thrill. Abdomen is benign with no organomegaly or masses noted. Motor sensory and DTR levels are equal and symmetric in the upper and lower extremities. Cranial nerves II through XII are grossly intact. Proprioception is intact. No peripheral adenopathy or edema is identified. No motor or sensory levels are noted. Crude visual fields are within normal range.  RADIOLOGY RESULTS: CT scans are reviewed and compatible above-stated findings  PLAN: Present time he is doing well with CT evidence of complete response. I'm please was overall progress.  I've asked to see him back in 6 months for follow-up and then will start once your follow-up visits. Patient knows to call sooner with any concerns.  I would like to take this opportunity to thank you for allowing me to participate in the care of your patient.Armstead Peaks., MD

## 2016-12-26 ENCOUNTER — Ambulatory Visit
Admission: RE | Admit: 2016-12-26 | Discharge: 2016-12-26 | Disposition: A | Discharge status: Home / Self Care | Payer: Medicare Other | Source: Ambulatory Visit | Attending: Oncology | Admitting: Oncology

## 2016-12-26 DIAGNOSIS — J984 Other disorders of lung: Secondary | ICD-10-CM | POA: Insufficient documentation

## 2016-12-26 DIAGNOSIS — I700 Atherosclerosis of aorta: Secondary | ICD-10-CM | POA: Insufficient documentation

## 2016-12-26 DIAGNOSIS — J438 Other emphysema: Secondary | ICD-10-CM | POA: Insufficient documentation

## 2016-12-26 DIAGNOSIS — I2510 Atherosclerotic heart disease of native coronary artery without angina pectoris: Secondary | ICD-10-CM | POA: Diagnosis not present

## 2016-12-26 DIAGNOSIS — J90 Pleural effusion, not elsewhere classified: Secondary | ICD-10-CM | POA: Insufficient documentation

## 2016-12-26 DIAGNOSIS — C3410 Malignant neoplasm of upper lobe, unspecified bronchus or lung: Secondary | ICD-10-CM | POA: Insufficient documentation

## 2016-12-26 DIAGNOSIS — J432 Centrilobular emphysema: Secondary | ICD-10-CM | POA: Insufficient documentation

## 2016-12-26 LAB — POCT I-STAT CREATININE: CREATININE: 0.8 mg/dL (ref 0.61–1.24)

## 2016-12-26 MED ORDER — IOPAMIDOL (ISOVUE-300) INJECTION 61%
75.0000 mL | Freq: Once | INTRAVENOUS | Status: AC | PRN
  Administered 2016-12-26: 75 mL via INTRAVENOUS

## 2016-12-30 NOTE — Progress Notes (Signed)
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  INTERVAL HISTORY: Patient returns to clinic today for further evaluation and discussion of his imaging results. He continues to feel well and is asymptomatic. He does not complain of shortness of breath today, but continues to have chronic nonproductive cough. He has no neurologic complaints. He denies any recent fevers. He denies chest pain. He denies any hemoptysis.  He denies any nausea, vomiting, constipation, or diarrhea. He has no urinary complaints. Patient offers no further specific complaints  REVIEW OF SYSTEMS:   Review of Systems  Constitutional: Negative for fever, malaise/fatigue and weight loss.  Respiratory: Positive for cough. Negative for hemoptysis and shortness of breath.   Cardiovascular: Negative.  Negative for chest pain and leg swelling.  Gastrointestinal: Negative.  Negative for abdominal pain.  Musculoskeletal: Negative.   Skin: Negative.  Negative for rash.  Neurological: Negative for sensory change and weakness.  Endo/Heme/Allergies: Does not bruise/bleed easily.  Psychiatric/Behavioral: The patient is not nervous/anxious.     As per HPI. Otherwise, a complete review of systems is negative.  PAST MEDICAL HISTORY: Past Medical History:  Diagnosis Date  . Cancer ([place])   . COPD (chronic obstructive pulmonary disease) ([place])   . Glaucoma   . Hypertension     PAST SURGICAL HISTORY: Past Surgical History:  Procedure Laterality Date  . LUNG BIOPSY    . PERIPHERAL VASCULAR CATHETERIZATION N/A 09/24/2015   Procedure: [name];  Surgeon: Algernon Huxley, MD;  Location: [facility];  Service:  Cardiovascular;  Laterality: N/A;  . Port a cath placement      FAMILY HISTORY: Reviewed and unchanged. No reported history of malignancy or chronic disease.     ADVANCED DIRECTIVES:    HEALTH MAINTENANCE: Social History  Substance Use Topics  . Smoking status: Current Every Day Smoker    Packs/day: 0.50    Years: 50.00    Types: Cigarettes  . Smokeless tobacco: Never Used  . Alcohol use 0.0 oz/week     Comment: 3 pints/week     No Known Allergies  Current Outpatient Prescriptions  Medication Sig Dispense Refill  . albuterol (PROVENTIL HFA;VENTOLIN HFA) 108 (90 BASE) MCG/ACT inhaler Inhale 2 puffs into the lungs every 6 (six) hours as needed for wheezing or shortness of breath. 1 Inhaler 0  . brimonidine (ALPHAGAN) 0.2 % ophthalmic solution Place 1 drop into both eyes 2 (two) times daily.   4  . lidocaine-prilocaine (EMLA) cream Apply 1 application topically as needed. Apply to port 1 hour prior to chemotherapy appointment. Cover with plastic wrap. 30 g 2  . megestrol (MEGACE) 40 MG tablet Take 1 tablet (40 mg total) by mouth daily. 30 tablet 1  . timolol (TIMOPTIC) 0.5 % ophthalmic solution   11   No current facility-administered medications for this visit.     OBJECTIVE: Vitals:   12/31/16 1338  BP: (!) 151/90  Pulse: 97  Resp: 20  Temp: 97.7 F (36.5 C)     Body mass index is 19.97 kg/m.    ECOG FS:0 - Asymptomatic  General: Well-developed, well-nourished, no acute distress. Eyes: Pink conjunctiva, anicteric sclera. Lungs: Clear to auscultation bilaterally. Heart: Regular rate and rhythm. No rubs,  murmurs, or gallops. Abdomen: Soft, nontender, nondistended. No organomegaly noted, normoactive bowel sounds. Musculoskeletal: No edema, cyanosis, or clubbing. Neuro: Alert, answering all questions appropriately. Cranial nerves grossly intact. Skin: No rashes or petechiae noted. Psych: Normal affect.   LAB RESULTS:  Lab Results  Component Value Date   NA 137  09/24/2016   K 3.7 09/24/2016   CL 104 09/24/2016   CO2 25 09/24/2016   GLUCOSE 88 09/24/2016   BUN 11 09/24/2016   CREATININE 0.80 12/26/2016   CALCIUM 8.4 (L) 09/24/2016   PROT 6.6 09/24/2016   ALBUMIN 3.3 (L) 09/24/2016   AST 24 09/24/2016   ALT 11 (L) 09/24/2016   ALKPHOS 61 09/24/2016   BILITOT 0.6 09/24/2016   GFRNONAA >60 09/24/2016   GFRAA >60 09/24/2016    Lab Results  Component Value Date   WBC 3.5 (L) 09/24/2016   NEUTROABS 2.2 09/24/2016   HGB 12.4 (L) 09/24/2016   HCT 37.4 (L) 09/24/2016   MCV 85.8 09/24/2016   PLT 167 09/24/2016     STUDIES: Ct Chest W Contrast  Result Date: 12/26/2016 CLINICAL DATA:  66 year old male with history of lung cancer diagnosed 1 year ago. Current smoker for the past 50 years. Chronic shortness of breath. EXAM: CT CHEST WITH CONTRAST TECHNIQUE: Multidetector CT imaging of the chest was performed during intravenous contrast administration. CONTRAST:  44mL ISOVUE-300 IOPAMIDOL (ISOVUE-300) INJECTION 61% COMPARISON:  Chest CT 09/24/2016. FINDINGS: Cardiovascular: Heart size is normal. Trace amount of anterior pericardial fluid and/or thickening, unlikely to be of any hemodynamic significance at this time. No associated pericardial calcification. There is aortic atherosclerosis, as well as atherosclerosis of the great vessels of the mediastinum and the coronary arteries, including calcified atherosclerotic plaque in the left main, left anterior descending, left circumflex and right coronary arteries. Right internal jugular single-lumen porta cath with tip terminating in the distal superior vena cava. Mediastinum/Nodes: No pathologically enlarged mediastinal or hilar lymph nodes. Esophagus is unremarkable in appearance. No axillary lymphadenopathy. Lungs/Pleura: 3.4 x 4.3 cm area of mass-like architectural distortion in the inferior segment of the lingula at this site of the treated mass, which appears slightly more bulky than the prior study from  09/24/2016. Surrounding areas of architectural distortion in the inferior aspect of the left lung are compatible with areas of chronic postradiation changes. No new suspicious appearing pulmonary nodules or masses are noted. No acute consolidative airspace disease. Diffuse bronchial wall thickening with moderate centrilobular and paraseptal emphysema. Small left pleural effusion posteriorly. No right pleural effusion. Upper Abdomen: Aortic atherosclerosis. subcentimeter low-attenuation lesion in segment 8 of the liver is too small to definitively characterize, but is similar to the prior examinations, likely a tiny cyst. Musculoskeletal: There are no aggressive appearing lytic or blastic lesions noted in the visualized portions of the skeleton. IMPRESSION: 1. Increased prominence of the treated lesion in the inferior segment of the lingula. Although this could simply reflect evolving postradiation mass-like fibrosis, this does appear more bulky than the prior study, and the possibility of residual or locally recurrent disease is not entirely excluded. Close attention on followup studies is recommended. 2. Trace left pleural effusion new compared to the prior study. 3. Mild diffuse bronchial wall thickening with moderate centrilobular and paraseptal emphysema; imaging findings compatible with the reported clinical history of COPD. 4. Aortic atherosclerosis, in addition to left main and 3 vessel coronary artery disease. Please note that although the presence of coronary artery calcium documents the presence of coronary artery disease, the severity of this  disease and any potential stenosis cannot be assessed on this non-gated CT examination. Assessment for potential risk factor modification, dietary therapy or pharmacologic therapy may be warranted, if clinically indicated. Aortic Atherosclerosis (ICD10-I70.0) and Emphysema (ICD10-J43.9). Electronically Signed   By: Vinnie Langton M.D.   On: 12/26/2016 10:57     ASSESSMENT: Stage IIIa squamous cell carcinoma of the lingula of lung.  PLAN:    1. 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 Patient only received one infusion of consolidation chemotherapy on December 27, 2015, but this was discontinued secondary to persistent pancytopenia. Repeat CT scan on December 27, 2016 reviewed independently and reported as above with noted increased prominence of mass as well as a new trace pleural effusion, but no definite evidence of progression. Because of this, will repeat CT scan in 3 months rather than 6 for further evaluation. If everything remains stable at that time, can return to imaging every 6 months. We will continue imaging approximately every 3-6 months until June 2019 when patient is 2 years removed from completing his treatment at which time he can be switched to yearly imaging. 2. Anemia: Resolved. 3. Leukopenia: Mild, monitor.  4. Cough: Chronic and unchanged, monitor.  Patient expressed understanding and was in agreement with this plan. He also understands that He can call clinic at any time with any questions, concerns, or complaints.   Lloyd Huger, MD 01/03/17 7:17 AM

## 2016-12-31 ENCOUNTER — Inpatient Hospital Stay: Payer: Medicare Other | Attending: Oncology | Admitting: Oncology

## 2016-12-31 VITALS — BP 151/90 | HR 97 | Temp 97.7°F | Resp 20 | Wt 151.4 lb

## 2016-12-31 DIAGNOSIS — D61818 Other pancytopenia: Secondary | ICD-10-CM | POA: Diagnosis not present

## 2016-12-31 DIAGNOSIS — I10 Essential (primary) hypertension: Secondary | ICD-10-CM | POA: Insufficient documentation

## 2016-12-31 DIAGNOSIS — Z9221 Personal history of antineoplastic chemotherapy: Secondary | ICD-10-CM

## 2016-12-31 DIAGNOSIS — Z79899 Other long term (current) drug therapy: Secondary | ICD-10-CM | POA: Diagnosis not present

## 2016-12-31 DIAGNOSIS — I700 Atherosclerosis of aorta: Secondary | ICD-10-CM | POA: Insufficient documentation

## 2016-12-31 DIAGNOSIS — Z85118 Personal history of other malignant neoplasm of bronchus and lung: Secondary | ICD-10-CM

## 2016-12-31 DIAGNOSIS — H409 Unspecified glaucoma: Secondary | ICD-10-CM | POA: Insufficient documentation

## 2016-12-31 DIAGNOSIS — F17210 Nicotine dependence, cigarettes, uncomplicated: Secondary | ICD-10-CM

## 2016-12-31 DIAGNOSIS — C3410 Malignant neoplasm of upper lobe, unspecified bronchus or lung: Secondary | ICD-10-CM

## 2016-12-31 DIAGNOSIS — J90 Pleural effusion, not elsewhere classified: Secondary | ICD-10-CM

## 2016-12-31 DIAGNOSIS — R05 Cough: Secondary | ICD-10-CM | POA: Diagnosis not present

## 2016-12-31 DIAGNOSIS — I2510 Atherosclerotic heart disease of native coronary artery without angina pectoris: Secondary | ICD-10-CM | POA: Diagnosis not present

## 2016-12-31 DIAGNOSIS — J449 Chronic obstructive pulmonary disease, unspecified: Secondary | ICD-10-CM | POA: Insufficient documentation

## 2016-12-31 NOTE — Progress Notes (Signed)
Patient denies any concerns today.  

## 2017-04-03 ENCOUNTER — Inpatient Hospital Stay: Payer: Medicare Other | Attending: Oncology

## 2017-04-03 ENCOUNTER — Ambulatory Visit
Admission: RE | Admit: 2017-04-03 | Discharge: 2017-04-03 | Disposition: A | Discharge status: Home / Self Care | Payer: Medicare Other | Source: Ambulatory Visit | Attending: Oncology | Admitting: Oncology

## 2017-04-03 DIAGNOSIS — Z85118 Personal history of other malignant neoplasm of bronchus and lung: Secondary | ICD-10-CM | POA: Insufficient documentation

## 2017-04-03 DIAGNOSIS — C3410 Malignant neoplasm of upper lobe, unspecified bronchus or lung: Secondary | ICD-10-CM

## 2017-04-03 DIAGNOSIS — J449 Chronic obstructive pulmonary disease, unspecified: Secondary | ICD-10-CM | POA: Diagnosis not present

## 2017-04-03 DIAGNOSIS — R05 Cough: Secondary | ICD-10-CM | POA: Diagnosis not present

## 2017-04-03 DIAGNOSIS — Z9221 Personal history of antineoplastic chemotherapy: Secondary | ICD-10-CM | POA: Diagnosis not present

## 2017-04-03 DIAGNOSIS — J439 Emphysema, unspecified: Secondary | ICD-10-CM | POA: Insufficient documentation

## 2017-04-03 DIAGNOSIS — Z923 Personal history of irradiation: Secondary | ICD-10-CM | POA: Insufficient documentation

## 2017-04-03 DIAGNOSIS — F17210 Nicotine dependence, cigarettes, uncomplicated: Secondary | ICD-10-CM | POA: Insufficient documentation

## 2017-04-03 DIAGNOSIS — H409 Unspecified glaucoma: Secondary | ICD-10-CM | POA: Diagnosis not present

## 2017-04-03 DIAGNOSIS — D61818 Other pancytopenia: Secondary | ICD-10-CM | POA: Insufficient documentation

## 2017-04-03 DIAGNOSIS — Z9889 Other specified postprocedural states: Secondary | ICD-10-CM | POA: Insufficient documentation

## 2017-04-03 DIAGNOSIS — R918 Other nonspecific abnormal finding of lung field: Secondary | ICD-10-CM | POA: Insufficient documentation

## 2017-04-03 DIAGNOSIS — I700 Atherosclerosis of aorta: Secondary | ICD-10-CM | POA: Diagnosis not present

## 2017-04-03 DIAGNOSIS — I10 Essential (primary) hypertension: Secondary | ICD-10-CM | POA: Diagnosis not present

## 2017-04-03 DIAGNOSIS — Z79899 Other long term (current) drug therapy: Secondary | ICD-10-CM | POA: Diagnosis not present

## 2017-04-03 LAB — CBC WITH DIFFERENTIAL/PLATELET
BASOS PCT: 1 %
Basophils Absolute: 0 10*3/uL (ref 0–0.1)
Eosinophils Absolute: 0.1 10*3/uL (ref 0–0.7)
Eosinophils Relative: 3 %
HCT: 35 % — ABNORMAL LOW (ref 40.0–52.0)
Hemoglobin: 11.8 g/dL — ABNORMAL LOW (ref 13.0–18.0)
LYMPHS PCT: 31 %
Lymphs Abs: 1 10*3/uL (ref 1.0–3.6)
MCH: 29.1 pg (ref 26.0–34.0)
MCHC: 33.7 g/dL (ref 32.0–36.0)
MCV: 86.3 fL (ref 80.0–100.0)
MONO ABS: 0.4 10*3/uL (ref 0.2–1.0)
Monocytes Relative: 13 %
NEUTROS PCT: 52 %
Neutro Abs: 1.7 10*3/uL (ref 1.4–6.5)
PLATELETS: 153 10*3/uL (ref 150–440)
RBC: 4.06 MIL/uL — AB (ref 4.40–5.90)
RDW: 16.3 % — AB (ref 11.5–14.5)
WBC: 3.2 10*3/uL — ABNORMAL LOW (ref 3.8–10.6)

## 2017-04-03 LAB — COMPREHENSIVE METABOLIC PANEL
ALBUMIN: 3.6 g/dL (ref 3.5–5.0)
ALT: 25 U/L (ref 17–63)
ANION GAP: 10 (ref 5–15)
AST: 55 U/L — AB (ref 15–41)
Alkaline Phosphatase: 52 U/L (ref 38–126)
BILIRUBIN TOTAL: 0.7 mg/dL (ref 0.3–1.2)
BUN: 13 mg/dL (ref 6–20)
CHLORIDE: 104 mmol/L (ref 101–111)
CO2: 24 mmol/L (ref 22–32)
Calcium: 8.9 mg/dL (ref 8.9–10.3)
Creatinine, Ser: 0.8 mg/dL (ref 0.61–1.24)
GFR calc Af Amer: >60 mL/min (ref >60)
GFR calc non Af Amer: >60 mL/min (ref >60)
GLUCOSE: 99 mg/dL (ref 65–99)
POTASSIUM: 4 mmol/L (ref 3.5–5.1)
SODIUM: 138 mmol/L (ref 135–145)
TOTAL PROTEIN: 6.6 g/dL (ref 6.5–8.1)

## 2017-04-03 LAB — POCT I-STAT CREATININE: Creatinine, Ser: 0.8 mg/dL (ref 0.61–1.24)

## 2017-04-03 MED ORDER — IOPAMIDOL (ISOVUE-300) INJECTION 61%
75.0000 mL | Freq: Once | INTRAVENOUS | Status: AC | PRN
  Administered 2017-04-03: 75 mL via INTRAVENOUS

## 2017-04-07 NOTE — Progress Notes (Signed)
[place]  Telephone:(336) [phone] Fax:(336) [phone]  ID: CHESNEY KLIMASZEWSKI OB: [birth year]  MR#: [id number]  FXT#:[id number]  Patient Care Team: Kirk Ruths, MD as PCP - General (Internal Medicine) Leona Singleton, RN as Oncology Nurse Navigator  CHIEF COMPLAINT: Stage IIIa squamous cell carcinoma of the lingula of lung.  INTERVAL HISTORY: Patient returns to clinic today for further evaluation and discussion of his imaging results. He continues to feel well and is asymptomatic. He does not complain of shortness of breath today, but continues to have chronic nonproductive cough. He has no neurologic complaints. He denies any recent fevers. He denies chest pain. He denies any hemoptysis.  He denies any nausea, vomiting, constipation, or diarrhea. He has no urinary complaints. Patient offers no further specific complaints  REVIEW OF SYSTEMS:   Review of Systems  Constitutional: Negative for fever, malaise/fatigue and weight loss.  Respiratory: Positive for cough. Negative for hemoptysis and shortness of breath.   Cardiovascular: Negative.  Negative for chest pain and leg swelling.  Gastrointestinal: Negative.  Negative for abdominal pain.  Genitourinary: Negative.   Musculoskeletal: Negative.   Skin: Negative.  Negative for rash.  Neurological: Negative for sensory change and weakness.  Endo/Heme/Allergies: Does not bruise/bleed easily.  Psychiatric/Behavioral: Negative.  The patient is not nervous/anxious.     As per HPI. Otherwise, a complete review of systems is negative.  PAST MEDICAL HISTORY: Past Medical History:  Diagnosis Date  . Cancer ([place])   . COPD (chronic obstructive pulmonary disease) ([place])   . Glaucoma   . Hypertension     PAST SURGICAL HISTORY: Past Surgical History:  Procedure Laterality Date  . LUNG BIOPSY    . PERIPHERAL VASCULAR CATHETERIZATION N/A 09/24/2015   Procedure: [name];  Surgeon: Algernon Huxley, MD;  Location:  [facility];  Service: Cardiovascular;  Laterality: N/A;  . Port a cath placement      FAMILY HISTORY: Reviewed and unchanged. No reported history of malignancy or chronic disease.     ADVANCED DIRECTIVES:    HEALTH MAINTENANCE: Social History  Substance Use Topics  . Smoking status: Current Every Day Smoker    Packs/day: 0.50    Years: 50.00    Types: Cigarettes  . Smokeless tobacco: Never Used  . Alcohol use 0.0 oz/week     Comment: 3 pints/week     No Known Allergies  Current Outpatient Prescriptions  Medication Sig Dispense Refill  . albuterol (PROVENTIL HFA;VENTOLIN HFA) 108 (90 BASE) MCG/ACT inhaler Inhale 2 puffs into the lungs every 6 (six) hours as needed for wheezing or shortness of breath. 1 Inhaler 0  . lidocaine-prilocaine (EMLA) cream Apply 1 application topically as needed. Apply to port 1 hour prior to chemotherapy appointment. Cover with plastic wrap. 30 g 2  . brimonidine (ALPHAGAN) 0.2 % ophthalmic solution Place 1 drop into both eyes 2 (two) times daily.   4  . megestrol (MEGACE) 40 MG tablet Take 1 tablet (40 mg total) by mouth daily. (Patient not taking: Reported on 04/08/2017) 30 tablet 1  . timolol (TIMOPTIC) 0.5 % ophthalmic solution   11   No current facility-administered medications for this visit.     OBJECTIVE: Vitals:   04/08/17 1342  BP: (!) 137/92  Pulse: 97  Resp: (!) 8  Temp: (!) 97.3 F (36.3 C)     Body mass index is 19.21 kg/m.    ECOG FS:0 - Asymptomatic  General: Well-developed, well-nourished, no acute distress. Eyes: Pink conjunctiva,  anicteric sclera. Lungs: Clear to auscultation bilaterally. Heart: Regular rate and rhythm. No rubs, murmurs, or gallops. Abdomen: Soft, nontender, nondistended. No organomegaly noted, normoactive bowel sounds. Musculoskeletal: No edema, cyanosis, or clubbing. Neuro: Alert, answering all questions appropriately. Cranial nerves grossly intact. Skin: No rashes or petechiae  noted. Psych: Normal affect.   LAB RESULTS:  Lab Results  Component Value Date   NA 138 04/03/2017   K 4.0 04/03/2017   CL 104 04/03/2017   CO2 24 04/03/2017   GLUCOSE 99 04/03/2017   BUN 13 04/03/2017   CREATININE 0.80 04/03/2017   CALCIUM 8.9 04/03/2017   PROT 6.6 04/03/2017   ALBUMIN 3.6 04/03/2017   AST 55 (H) 04/03/2017   ALT 25 04/03/2017   ALKPHOS 52 04/03/2017   BILITOT 0.7 04/03/2017   GFRNONAA >60 04/03/2017   GFRAA >60 04/03/2017    Lab Results  Component Value Date   WBC 3.2 (L) 04/03/2017   NEUTROABS 1.7 04/03/2017   HGB 11.8 (L) 04/03/2017   HCT 35.0 (L) 04/03/2017   MCV 86.3 04/03/2017   PLT 153 04/03/2017     STUDIES: Ct Chest W Contrast  Result Date: 04/03/2017 CLINICAL DATA:  Lung cancer, status post chemotherapy and radiation EXAM: CT CHEST WITH CONTRAST TECHNIQUE: Multidetector CT imaging of the chest was performed during intravenous contrast administration. CONTRAST:  40mL ISOVUE-300 IOPAMIDOL (ISOVUE-300) INJECTION 61% COMPARISON:  CT chest dated 12/26/2016 FINDINGS: Cardiovascular: Heart is normal in size.  No pericardial effusion. Ectasia of the ascending thoracic aorta, measuring 3.5 cm. Atherosclerotic calcifications of the aortic arch. Coronary atherosclerosis of the LAD. Mediastinum/Nodes: No suspicious mediastinal lymphadenopathy. Visualized thyroid is unremarkable. Lungs/Pleura: Platelike opacity in the lingula (series 3/ image 106), decreased in thickness on coronal imaging when compared to the prior (series 5/ image 36). This appearance favors radiation changes/scarring can is not concerning for recurrence on the current study. Moderate centrilobular and paraseptal emphysematous changes, upper lobe predominant. No suspicious pulmonary nodules. No focal consolidation. No pleural effusion or pneumothorax. Upper Abdomen: Visualized upper abdomen is notable for a 3.5 cm posterior right renal cyst (series 2/ image 193). Musculoskeletal: Visualized  osseous structures are within normal limits. IMPRESSION: Platelike scarring/ fibrosis in the lingula, favoring radiation changes, less conspicuous than the prior and not considered concerning for recurrence on the current study. No evidence of recurrent or metastatic disease in the chest. Aortic Atherosclerosis (ICD10-I70.0) and Emphysema (ICD10-J43.9). Electronically Signed   By: Julian Hy M.D.   On: 04/03/2017 15:07    ASSESSMENT: Stage IIIa squamous cell carcinoma of the lingula of lung.  PLAN:    1. 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 Patient only received one infusion of consolidation chemotherapy on December 27, 2015, but this was discontinued secondary to persistent pancytopenia. Repeat CT scan on April 03, 2017 reviewed independently and reported as above with no concern of progressive or recurrent disease. Continue imaging approximately every 6 months until June 2019 when patient is 2 years removed from completing his treatment at which time he can be switched to yearly imaging. Return to clinic in 6 months with repeat CT scan and further evaluation. 2. Anemia: Resolved. 3. Leukopenia: Mild, monitor.  4. Cough: Chronic and unchanged, monitor.  Patient expressed understanding and was in agreement with this plan. He also understands that He can call clinic at any time with any questions, concerns, or complaints.   Lloyd Huger, MD 04/12/17 8:33 AM

## 2017-04-08 ENCOUNTER — Inpatient Hospital Stay (HOSPITAL_BASED_OUTPATIENT_CLINIC_OR_DEPARTMENT_OTHER): Payer: Medicare Other | Admitting: Oncology

## 2017-04-08 VITALS — BP 137/92 | HR 97 | Temp 97.3°F | Resp 8 | Wt 145.6 lb

## 2017-04-08 DIAGNOSIS — Z79899 Other long term (current) drug therapy: Secondary | ICD-10-CM | POA: Diagnosis not present

## 2017-04-08 DIAGNOSIS — D61818 Other pancytopenia: Secondary | ICD-10-CM

## 2017-04-08 DIAGNOSIS — C3410 Malignant neoplasm of upper lobe, unspecified bronchus or lung: Secondary | ICD-10-CM | POA: Diagnosis not present

## 2017-04-08 DIAGNOSIS — Z923 Personal history of irradiation: Secondary | ICD-10-CM

## 2017-04-08 DIAGNOSIS — R05 Cough: Secondary | ICD-10-CM

## 2017-04-08 DIAGNOSIS — Z9221 Personal history of antineoplastic chemotherapy: Secondary | ICD-10-CM

## 2017-04-08 DIAGNOSIS — F17210 Nicotine dependence, cigarettes, uncomplicated: Secondary | ICD-10-CM

## 2017-04-08 NOTE — Progress Notes (Signed)
Patient denies any concerns today.  

## 2017-04-29 ENCOUNTER — Ambulatory Visit
Admission: RE | Admit: 2017-04-29 | Discharge: 2017-04-29 | Disposition: A | Discharge status: Home / Self Care | Payer: Medicare Other | Source: Ambulatory Visit | Attending: Radiation Oncology | Admitting: Radiation Oncology

## 2017-04-29 ENCOUNTER — Encounter: Payer: Self-pay | Admitting: Radiation Oncology

## 2017-04-29 VITALS — BP 140/89 | HR 93 | Temp 98.0°F | Resp 20 | Wt 144.8 lb

## 2017-04-29 DIAGNOSIS — Z923 Personal history of irradiation: Secondary | ICD-10-CM | POA: Insufficient documentation

## 2017-04-29 DIAGNOSIS — Z9221 Personal history of antineoplastic chemotherapy: Secondary | ICD-10-CM | POA: Diagnosis not present

## 2017-04-29 DIAGNOSIS — C3412 Malignant neoplasm of upper lobe, left bronchus or lung: Secondary | ICD-10-CM

## 2017-04-29 DIAGNOSIS — F17210 Nicotine dependence, cigarettes, uncomplicated: Secondary | ICD-10-CM | POA: Diagnosis not present

## 2017-04-29 DIAGNOSIS — Z85118 Personal history of other malignant neoplasm of bronchus and lung: Secondary | ICD-10-CM | POA: Diagnosis present

## 2017-04-29 NOTE — Progress Notes (Signed)
Radiation Oncology Follow up Note  Name: Carl Bell   Date:   04/29/2017 MRN:  [id number] DOB: 05-29-1951    This 66 y.o. male presents to the clinic today for 1-1/2 year follow-up status post concurrent chemoradiation therapy and chemotherapy for stage IIIa squamous cell carcinoma of the left upper lobe.  REFERRING PROVIDER: Kirk Ruths, MD  HPI: Patient is a 66 year old male now seen out a year and a half having completed concurrent chemoradiation for stage IIIa squamous cell carcinoma of left upper lobe. He is seen today in routine follow-up and is doing well. He specifically denies cough hemoptysis or chest tightness.Marland Kitchen He is recently in September had a CT scan showing no evidence of recurrent or progressive disease. He did have some problems with pancytopenia. Although that has been resolved.  COMPLICATIONS OF TREATMENT: none  FOLLOW UP COMPLIANCE: keeps appointments   PHYSICAL EXAM:  BP 140/89   Pulse 93   Temp 98 F (36.7 C)   Resp 20   Wt 144 lb 13.5 oz (65.7 kg)   BMI 19.11 kg/m  Well-developed well-nourished patient in NAD. HEENT reveals PERLA, EOMI, discs not visualized.  Oral cavity is clear. No oral mucosal lesions are identified. Neck is clear without evidence of cervical or supraclavicular adenopathy. Lungs are clear to A&P. Cardiac examination is essentially unremarkable with regular rate and rhythm without murmur rub or thrill. Abdomen is benign with no organomegaly or masses noted. Motor sensory and DTR levels are equal and symmetric in the upper and lower extremities. Cranial nerves II through XII are grossly intact. Proprioception is intact. No peripheral adenopathy or edema is identified. No motor or sensory levels are noted. Crude visual fields are within normal range.  RADIOLOGY RESULTS: CT scan reviewed and compatible with the above-stated findings showing only some radiation changes no evidence of persistent or recurrent disease.  PLAN: Present time  patient is doing well with no evidence of disease. I'm please was overall progress. I've asked to see him back in 1 year for follow-up. Already has a follow-up CT scan ordered by medical oncology. Patient knows to call with any concerns.  I would like to take this opportunity to thank you for allowing me to participate in the care of your patient.Armstead Peaks., MD

## 2017-07-23 ENCOUNTER — Emergency Department: Payer: Medicare Other

## 2017-07-23 ENCOUNTER — Emergency Department
Admission: EM | Admit: 2017-07-23 | Discharge: 2017-07-23 | Disposition: A | Discharge status: Home / Self Care | Payer: Medicare Other | Attending: Emergency Medicine | Admitting: Emergency Medicine

## 2017-07-23 DIAGNOSIS — Z85118 Personal history of other malignant neoplasm of bronchus and lung: Secondary | ICD-10-CM | POA: Insufficient documentation

## 2017-07-23 DIAGNOSIS — Z79899 Other long term (current) drug therapy: Secondary | ICD-10-CM | POA: Diagnosis not present

## 2017-07-23 DIAGNOSIS — I10 Essential (primary) hypertension: Secondary | ICD-10-CM | POA: Diagnosis not present

## 2017-07-23 DIAGNOSIS — F17210 Nicotine dependence, cigarettes, uncomplicated: Secondary | ICD-10-CM | POA: Insufficient documentation

## 2017-07-23 DIAGNOSIS — J449 Chronic obstructive pulmonary disease, unspecified: Secondary | ICD-10-CM | POA: Diagnosis not present

## 2017-07-23 DIAGNOSIS — R0600 Dyspnea, unspecified: Secondary | ICD-10-CM | POA: Insufficient documentation

## 2017-07-23 DIAGNOSIS — R0602 Shortness of breath: Secondary | ICD-10-CM | POA: Diagnosis present

## 2017-07-23 LAB — TROPONIN I

## 2017-07-23 LAB — CBC
HCT: 40.3 % (ref 40.0–52.0)
HEMOGLOBIN: 13.1 g/dL (ref 13.0–18.0)
MCH: 27.7 pg (ref 26.0–34.0)
MCHC: 32.5 g/dL (ref 32.0–36.0)
MCV: 85.2 fL (ref 80.0–100.0)
Platelets: 125 10*3/uL — ABNORMAL LOW (ref 150–440)
RBC: 4.73 MIL/uL (ref 4.40–5.90)
RDW: 13.9 % (ref 11.5–14.5)
WBC: 3.9 10*3/uL (ref 3.8–10.6)

## 2017-07-23 LAB — BASIC METABOLIC PANEL
ANION GAP: 13 (ref 5–15)
BUN: 13 mg/dL (ref 6–20)
CALCIUM: 9 mg/dL (ref 8.9–10.3)
CO2: 25 mmol/L (ref 22–32)
Chloride: 98 mmol/L — ABNORMAL LOW (ref 101–111)
Creatinine, Ser: 0.87 mg/dL (ref 0.61–1.24)
GFR calc Af Amer: >60 mL/min (ref >60)
Glucose, Bld: 168 mg/dL — ABNORMAL HIGH (ref 65–99)
Potassium: 3.5 mmol/L (ref 3.5–5.1)
Sodium: 136 mmol/L (ref 135–145)

## 2017-07-23 LAB — FIBRIN DERIVATIVES D-DIMER (ARMC ONLY): FIBRIN DERIVATIVES D-DIMER (ARMC): 2367.01 ng{FEU}/mL — AB (ref 0.00–499.00)

## 2017-07-23 MED ORDER — GUAIFENESIN 100 MG/5ML PO SOLN
5.0000 mL | Freq: Once | ORAL | Status: AC
  Administered 2017-07-23: 100 mg via ORAL
  Filled 2017-07-23: qty 5

## 2017-07-23 MED ORDER — IPRATROPIUM-ALBUTEROL 0.5-2.5 (3) MG/3ML IN SOLN
3.0000 mL | Freq: Once | RESPIRATORY_TRACT | Status: AC
  Administered 2017-07-23: 3 mL via RESPIRATORY_TRACT
  Filled 2017-07-23: qty 3

## 2017-07-23 MED ORDER — GUAIFENESIN 200 MG PO TABS: 400.0000 mg | ORAL_TABLET | Freq: Four times a day (QID) | ORAL | 0 refills | Status: DC | PRN

## 2017-07-23 MED ORDER — IOPAMIDOL (ISOVUE-370) INJECTION 76%
75.0000 mL | Freq: Once | INTRAVENOUS | Status: AC | PRN
  Administered 2017-07-23: 75 mL via INTRAVENOUS

## 2017-07-23 NOTE — ED Notes (Signed)
Call pharmacy to request robitussin

## 2017-07-23 NOTE — ED Provider Notes (Signed)
-----------------------------------------   10:02 PM on 07/23/2017 -----------------------------------------  I received signout on this patient from Dr. Cinda Quest.  Patient had elevated d-dimer after an episode of resolved shortness of breath and was pending a CT chest.  CT chest is negative for PE or other acute findings except for atelectasis which was also seen on x-ray.  At this time when I was in the room patient's heart rate was in the high 90s, and his other vital signs were within normal limits.  He reported no active shortness of breath and felt comfortable.  He states that he felt well to go home.  He states he does use inhalers for his COPD.  I suspect most likely mild acute bronchitis or very mild COPD exacerbation.  I instructed the patient to use his regular inhaler of albuterol every 4-6 hours.  He states that he has it and does not need a prescription for it.  I also instructed the patient to follow-up with his primary care doctor and gave him return precautions.  Patient and family members expressed understanding.   Arta Silence, MD 07/23/17 2203

## 2017-07-23 NOTE — ED Triage Notes (Signed)
Per EMS: patient c/o SOB and productive cough. Patient has hx COPD and lung cancer.  Patient given 1 duoneb and 125mg  solumedrol by EMS.  Patient c/o generalized lung tightness

## 2017-07-23 NOTE — ED Provider Notes (Signed)
[facility] Emergency Department Provider Note   ____________________________________________   None    (approximate)  I have reviewed the triage vital signs and the nursing notes.   HISTORY  Chief Complaint Shortness of Breath    HPI Carl Bell is a 67 y.o. male Patient has a history of bronchitis COPD emphysema and lung cancer. He has been having a cough productive of some yellow sputum for the last day or 2. He's been short of breath. He called EMS and EMS gave him an DuoNeb treatment and he is now somewhat better. He still tachycardic and using accessory muscles to breathe.   Past Medical History:  Diagnosis Date  . Cancer ([place])   . COPD (chronic obstructive pulmonary disease) ([place])   . Glaucoma   . Hypertension     Patient Active Problem List   Diagnosis Date Noted  . Acute respiratory distress 12/22/2015  . COPD exacerbation ([place]) 12/22/2015  . Acute bronchitis 12/22/2015  . HTN, goal below 140/80 12/22/2015  . Lung cancer, lingula ([place]) 09/23/2015    Past Surgical History:  Procedure Laterality Date  . LUNG BIOPSY    . PERIPHERAL VASCULAR CATHETERIZATION N/A 09/24/2015   Procedure: [name];  Surgeon: Algernon Huxley, MD;  Location: [facility];  Service: Cardiovascular;  Laterality: N/A;  . Port a cath placement      Prior to Admission medications   Medication Sig Start Date End Date Taking? Authorizing Provider  albuterol (PROVENTIL HFA;VENTOLIN HFA) 108 (90 BASE) MCG/ACT inhaler Inhale 2 puffs into the lungs every 6 (six) hours as needed for wheezing or shortness of breath. Patient not taking: Reported on 04/29/2017 04/29/15   Loney Hering, MD  brimonidine [facility]) 0.2 % ophthalmic solution Place 1 drop into both eyes 2 (two) times daily.  07/30/15   [provider]  lidocaine-prilocaine (EMLA) cream Apply 1 application topically as needed. Apply to port 1 hour prior to chemotherapy  appointment. Cover with plastic wrap. Patient not taking: Reported on 04/29/2017 10/04/15   Lloyd Huger, MD  megestrol (MEGACE) 40 MG tablet Take 1 tablet (40 mg total) by mouth daily. Patient not taking: Reported on 04/08/2017 10/18/15   Lloyd Huger, MD  timolol (TIMOPTIC) 0.5 % ophthalmic solution  08/20/16   [provider]    Allergies Patient has no known allergies.  Family History  Problem Relation Age of Onset  . Diabetes Mellitus II Mother   . Hypertension Father     Social History Social History   Tobacco Use  . Smoking status: Current Every Day Smoker    Packs/day: 0.50    Years: 50.00    Pack years: 25.00    Types: Cigarettes  . Smokeless tobacco: Never Used  Substance Use Topics  . Alcohol use: Yes    Alcohol/week: 0.0 oz    Comment: 3 pints/week  . Drug use: No    Review of Systems  Constitutional: No fever/chills Eyes: No visual changes. ENT: No sore throat. Cardiovascular:  chest painpatient reports his pain is from coughing is achy. Respiratoryshortness of breath. Gastrointestinal: No abdominal pain.  No nausea, no vomiting.  No diarrhea.  No constipation. Genitourinary: Negative for dysuria. Musculoskeletal: Negative for back pain. Skin: Negative for rash. Neurological: Negative for headaches, focal weakness  ____________________________________________   PHYSICAL EXAM:  VITAL SIGNS: ED Triage Vitals  Enc Vitals Group     BP --      Pulse --  Resp --      Temp --      Temp src --      SpO2 07/23/17 1945 94 %     Weight 07/23/17 1948 144 lb (65.3 kg)     Height --      Head Circumference --      Peak Flow --      Pain Score 07/23/17 1948 6     Pain Loc --      Pain Edu? --      Excl. in [place]? --     Constitutional: Alert and oriented. Well appearing and in no acute distress. Eyes: Conjunctivae are normal.  Head: Atraumatic. Nose: No congestion/rhinnorhea. Mouth/Throat: Mucous membranes are moist.  Oropharynx  non-erythematous. Neck: No stridor.   Cardiovascular: Normal rate, regular rhythm. Grossly normal heart sounds.  Good peripheral circulation. Respiratory: Normal respiratory effort.  No retractions. Lungs wheezes more in the left than the right Gastrointestinal: Soft and nontender. No distention. No abdominal bruits. No CVA tenderness. Musculoskeletal: No lower extremity tenderness nor edema.  No joint effusions. Neurologic:  Normal speech and language. No gross focal neurologic deficits are appreciated.  Skin:  Skin is warm, dry and intact. No rash noted. Psychiatric: Mood and affect are normal. Speech and behavior are normal.  ____________________________________________   LABS (all labs ordered are listed, but only abnormal results are displayed)  Labs Reviewed  BASIC METABOLIC PANEL - Abnormal; Notable for the following components:      Result Value   Chloride 98 (*)    Glucose, Bld 168 (*)    All other components within normal limits  CBC - Abnormal; Notable for the following components:   Platelets 125 (*)    All other components within normal limits  FIBRIN DERIVATIVES D-DIMER ([facility] ONLY) - Abnormal; Notable for the following components:   Fibrin derivatives D-dimer [facility]) 2,367.01 (*)    All other components within normal limits  TROPONIN I   ____________________________________________  EKG KG read and interpreted by me shows sinus tachycardia rate of 122 normal axis some ST segment depression mostly inferiorly this may be rate related  ____________________________________________  RADIOLOGY  chest x-ray reviewed by me shows what appeared to be the plate like atelectasis described in the previous CT scan no pneumonia ___radiology agrees_________________________________________   PROCEDURES  Procedure(s) performed:  Procedures  Critical Care performed:   ___________________________________________   INITIAL IMPRESSION / ASSESSMENT AND PLAN / ED  COURSE  patient still somewhat tachycardic his d-dimer is positive old get a CT of his chest I will sign him out to Dr. Cherylann Banas since he was still tachycardic      ____________________________________________   FINAL CLINICAL IMPRESSION(S) / ED DIAGNOSES  Final diagnoses:  Dyspnea, unspecified type     ED Discharge Orders    None       Note:  This document was prepared using Dragon voice recognition software and may include unintentional dictation errors.    Nena Polio, MD 07/23/17 2122

## 2017-07-23 NOTE — Discharge Instructions (Signed)
Return to the emergency department for new or worsening shortness of breath, chest pain, weakness or lightheadedness, high fevers, or any other new or worsening symptoms.  Use your albuterol inhaler every 4-6 hours for the next several days, and follow-up with your primary care doctor.

## 2017-07-23 NOTE — ED Notes (Signed)
Reviewed discharge instructions, follow-up care, and prescriptions with patient. Patient verbalized understanding of all information reviewed. Patient stable, with no distress noted at this time.    

## 2017-08-08 ENCOUNTER — Other Ambulatory Visit: Payer: Self-pay

## 2017-08-08 ENCOUNTER — Emergency Department
Admission: EM | Admit: 2017-08-08 | Discharge: 2017-08-08 | Disposition: A | Discharge status: Home / Self Care | Payer: Medicare Other | Attending: Emergency Medicine | Admitting: Emergency Medicine

## 2017-08-08 ENCOUNTER — Emergency Department: Payer: Medicare Other

## 2017-08-08 ENCOUNTER — Encounter: Payer: Self-pay | Admitting: Emergency Medicine

## 2017-08-08 DIAGNOSIS — J40 Bronchitis, not specified as acute or chronic: Secondary | ICD-10-CM

## 2017-08-08 DIAGNOSIS — Z85118 Personal history of other malignant neoplasm of bronchus and lung: Secondary | ICD-10-CM | POA: Insufficient documentation

## 2017-08-08 DIAGNOSIS — F17210 Nicotine dependence, cigarettes, uncomplicated: Secondary | ICD-10-CM | POA: Insufficient documentation

## 2017-08-08 DIAGNOSIS — J441 Chronic obstructive pulmonary disease with (acute) exacerbation: Secondary | ICD-10-CM

## 2017-08-08 DIAGNOSIS — R0602 Shortness of breath: Secondary | ICD-10-CM | POA: Diagnosis present

## 2017-08-08 DIAGNOSIS — I10 Essential (primary) hypertension: Secondary | ICD-10-CM | POA: Insufficient documentation

## 2017-08-08 LAB — TROPONIN I: TROPONIN I: 0.04 ng/mL — AB (ref <0.03)

## 2017-08-08 LAB — CBC
HEMATOCRIT: 35.2 % — AB (ref 40.0–52.0)
HEMOGLOBIN: 11.5 g/dL — AB (ref 13.0–18.0)
MCH: 27.4 pg (ref 26.0–34.0)
MCHC: 32.6 g/dL (ref 32.0–36.0)
MCV: 84.2 fL (ref 80.0–100.0)
Platelets: 236 10*3/uL (ref 150–440)
RBC: 4.18 MIL/uL — ABNORMAL LOW (ref 4.40–5.90)
RDW: 14.5 % (ref 11.5–14.5)
WBC: 4.9 10*3/uL (ref 3.8–10.6)

## 2017-08-08 LAB — BASIC METABOLIC PANEL
ANION GAP: 9 (ref 5–15)
BUN: 11 mg/dL (ref 6–20)
CALCIUM: 9 mg/dL (ref 8.9–10.3)
CO2: 30 mmol/L (ref 22–32)
Chloride: 103 mmol/L (ref 101–111)
Creatinine, Ser: 0.82 mg/dL (ref 0.61–1.24)
GFR calc Af Amer: >60 mL/min (ref >60)
Glucose, Bld: 106 mg/dL — ABNORMAL HIGH (ref 65–99)
POTASSIUM: 3.4 mmol/L — AB (ref 3.5–5.1)
SODIUM: 142 mmol/L (ref 135–145)

## 2017-08-08 MED ORDER — DEXAMETHASONE SODIUM PHOSPHATE 10 MG/ML IJ SOLN
10.0000 mg | Freq: Once | INTRAMUSCULAR | Status: AC
  Administered 2017-08-08: 10 mg via INTRAMUSCULAR
  Filled 2017-08-08: qty 1

## 2017-08-08 MED ORDER — IPRATROPIUM-ALBUTEROL 0.5-2.5 (3) MG/3ML IN SOLN
3.0000 mL | Freq: Once | RESPIRATORY_TRACT | Status: AC
  Administered 2017-08-08: 3 mL via RESPIRATORY_TRACT
  Filled 2017-08-08: qty 3

## 2017-08-08 MED ORDER — GUAIFENESIN ER 600 MG PO TB12: 600.0000 mg | ORAL_TABLET | Freq: Two times a day (BID) | ORAL | 0 refills | Status: AC

## 2017-08-08 NOTE — ED Provider Notes (Signed)
[facility] Emergency Department Provider Note   ____________________________________________    I have reviewed the triage vital signs and the nursing notes.   HISTORY  Chief Complaint Shortness of Breath     HPI Carl Bell is a 67 y.o. male who presents with complaints of shortness of breath, cough productive with yellow sputum for the last couple days.  He reports that this always happens when he gets a cold which exacerbates his COPD.  He denies chest pain.  He reports yesterday he was really struggling to breathe although he feels better today.  Denies leg pain or swelling.  Lung cancer is in remission per patient.  Denies fevers or chills.  Reports in the past Mucinex has helped with his symptoms.   Past Medical History:  Diagnosis Date  . Cancer ([place])   . COPD (chronic obstructive pulmonary disease) ([place])   . Glaucoma   . Hypertension     Patient Active Problem List   Diagnosis Date Noted  . Acute respiratory distress 12/22/2015  . COPD exacerbation ([place]) 12/22/2015  . Acute bronchitis 12/22/2015  . HTN, goal below 140/80 12/22/2015  . Lung cancer, lingula ([place]) 09/23/2015    Past Surgical History:  Procedure Laterality Date  . LUNG BIOPSY    . PERIPHERAL VASCULAR CATHETERIZATION N/A 09/24/2015   Procedure: [name];  Surgeon: Algernon Huxley, MD;  Location: [facility];  Service: Cardiovascular;  Laterality: N/A;  . Port a cath placement      Prior to Admission medications   Medication Sig Start Date End Date Taking? Authorizing Provider  albuterol (PROVENTIL HFA;VENTOLIN HFA) 108 (90 BASE) MCG/ACT inhaler Inhale 2 puffs into the lungs every 6 (six) hours as needed for wheezing or shortness of breath. 04/29/15  Yes Loney Hering, MD  guaiFENesin (MUCINEX) 600 MG 12 hr tablet Take 1 tablet (600 mg total) by mouth 2 (two) times daily for 20 days. 08/08/17 08/28/17  Lavonia Drafts, MD  lidocaine-prilocaine  (EMLA) cream Apply 1 application topically as needed. Apply to port 1 hour prior to chemotherapy appointment. Cover with plastic wrap. Patient not taking: Reported on 04/29/2017 10/04/15   Lloyd Huger, MD  megestrol (MEGACE) 40 MG tablet Take 1 tablet (40 mg total) by mouth daily. Patient not taking: Reported on 04/08/2017 10/18/15   Lloyd Huger, MD  travoprost, benzalkonium, (TRAVATAN) 0.004 % ophthalmic solution Place 1 drop into both eyes at bedtime.    [provider]     Allergies Patient has no known allergies.  Family History  Problem Relation Age of Onset  . Diabetes Mellitus II Mother   . Hypertension Father     Social History Social History   Tobacco Use  . Smoking status: Current Every Day Smoker    Packs/day: 0.50    Years: 50.00    Pack years: 25.00    Types: Cigarettes  . Smokeless tobacco: Never Used  Substance Use Topics  . Alcohol use: Yes    Alcohol/week: 0.0 oz    Comment: 3 pints/week  . Drug use: No    Review of Systems  Constitutional: No fever/chills Eyes: No visual changes.  ENT: No pharyngitis Cardiovascular: Denies chest pain. Respiratory: As above Gastrointestinal:  No nausea, no vomiting.   Genitourinary: Negative for dysuria. Musculoskeletal: Negative for back pain. Skin: Negative for rash. Neurological: Negative for headache   ____________________________________________   PHYSICAL EXAM:  VITAL SIGNS: ED Triage Vitals  Enc Vitals Group  BP 08/08/17 1441 (!) 139/93     Pulse Rate 08/08/17 1441 77     Resp 08/08/17 1441 20     Temp 08/08/17 1441 98.9 F (37.2 C)     Temp Source 08/08/17 1441 Oral     SpO2 08/08/17 1441 96 %     Weight 08/08/17 1442 70.3 kg (155 lb)     Height 08/08/17 1442 1.829 m (6')     Head Circumference --      Peak Flow --      Pain Score --      Pain Loc --      Pain Edu? --      Excl. in [place]? --     Constitutional: Alert and oriented. No acute distress. Pleasant and  interactive Eyes: Conjunctivae are normal.   Nose: No congestion/rhinnorhea. Mouth/Throat: Mucous membranes are moist.   Neck:  Painless ROM Cardiovascular: Normal rate, regular rhythm. Grossly normal heart sounds.  Good peripheral circulation.  Port noted Respiratory: Normal respiratory effort.  No retractions.  Scattered mild wheezes Gastrointestinal: Soft and nontender. No distention.  No CVA tenderness. Genitourinary: deferred Musculoskeletal: No lower extremity tenderness nor edema.  Warm and well perfused Neurologic:  Normal speech and language. No gross focal neurologic deficits are appreciated.  Skin:  Skin is warm, dry and intact. No rash noted. Psychiatric: Mood and affect are normal. Speech and behavior are normal.  ____________________________________________   LABS (all labs ordered are listed, but only abnormal results are displayed)  Labs Reviewed  BASIC METABOLIC PANEL - Abnormal; Notable for the following components:      Result Value   Potassium 3.4 (*)    Glucose, Bld 106 (*)    All other components within normal limits  CBC - Abnormal; Notable for the following components:   RBC 4.18 (*)    Hemoglobin 11.5 (*)    HCT 35.2 (*)    All other components within normal limits  TROPONIN I - Abnormal; Notable for the following components:   Troponin I 0.04 (*)    All other components within normal limits   ____________________________________________  EKG  ED ECG REPORT I, Lavonia Drafts, the attending physician, personally viewed and interpreted this ECG.  Date: 08/08/2017  Rate: 78 Rhythm: normal sinus rhythm QRS Axis: normal Intervals: normal ST/T Wave abnormalities: normal Narrative Interpretation: no evidence of acute ischemia  ____________________________________________  RADIOLOGY  Chest x-ray unremarkable ____________________________________________   PROCEDURES  Procedure(s) performed: No  Procedures   Critical Care performed:  No ____________________________________________   INITIAL IMPRESSION / ASSESSMENT AND PLAN / ED COURSE  Pertinent labs & imaging results that were available during my care of the patient were reviewed by me and considered in my medical decision making (see chart for details).  Patient well-appearing in no acute distress.  Vital signs are reassuring.  Exam is notable for scattered mild wheezes.  Lab work overall reassuring, minimally elevated troponin likely related to shortness of breath.  EKG reassuring, no reports of chest pain.  ----------------------------------------- 6:02 PM on 08/08/2017 -----------------------------------------  Patient feeling significantly better after Decadron and duo nebs.  Appropriate for discharge at this time, return precautions discussed    ____________________________________________   FINAL CLINICAL IMPRESSION(S) / ED DIAGNOSES  Final diagnoses:  COPD exacerbation ([place])  Bronchitis        Note:  This document was prepared using Dragon voice recognition software and may include unintentional dictation errors.    Lavonia Drafts, MD 08/08/17 Johnnye Lana

## 2017-08-08 NOTE — ED Triage Notes (Signed)
Pt presents to ED with c/o [facility] and productive cough with yellow sputum. Pt seen on 1/10 and dx with bronchitis and emphysema. Pt has hx of COPD and lung cancer. Pt able to speak in complete sentences without difficulty.

## 2017-08-13 ENCOUNTER — Inpatient Hospital Stay: Payer: Medicare Other | Admitting: Oncology

## 2017-08-14 ENCOUNTER — Encounter: Payer: Self-pay | Admitting: Nurse Practitioner

## 2017-08-14 ENCOUNTER — Inpatient Hospital Stay: Payer: Medicare Other | Attending: Nurse Practitioner | Admitting: Nurse Practitioner

## 2017-08-14 ENCOUNTER — Other Ambulatory Visit: Payer: Self-pay

## 2017-08-14 VITALS — BP 133/81 | HR 102 | Temp 98.5°F | Resp 16 | Ht 73.0 in | Wt 150.9 lb

## 2017-08-14 DIAGNOSIS — F17210 Nicotine dependence, cigarettes, uncomplicated: Secondary | ICD-10-CM | POA: Diagnosis not present

## 2017-08-14 DIAGNOSIS — C3410 Malignant neoplasm of upper lobe, unspecified bronchus or lung: Secondary | ICD-10-CM

## 2017-08-14 DIAGNOSIS — J441 Chronic obstructive pulmonary disease with (acute) exacerbation: Secondary | ICD-10-CM

## 2017-08-14 DIAGNOSIS — C3412 Malignant neoplasm of upper lobe, left bronchus or lung: Secondary | ICD-10-CM

## 2017-08-14 MED ORDER — PREDNISONE 20 MG PO TABS: 40.0000 mg | ORAL_TABLET | Freq: Every day | ORAL | 0 refills | Status: AC

## 2017-08-14 MED ORDER — AZITHROMYCIN 250 MG PO TABS: ORAL_TABLET | ORAL | 0 refills | Status: DC

## 2017-08-14 MED ORDER — ALBUTEROL SULFATE HFA 108 (90 BASE) MCG/ACT IN AERS: 2.0000 | INHALATION_SPRAY | Freq: Four times a day (QID) | RESPIRATORY_TRACT | 3 refills | Status: DC | PRN

## 2017-08-14 MED ORDER — TIOTROPIUM BROMIDE MONOHYDRATE 18 MCG IN CAPS: 18.0000 ug | ORAL_CAPSULE | Freq: Every day | RESPIRATORY_TRACT | 11 refills | Status: DC

## 2017-08-14 NOTE — Patient Instructions (Addendum)
Chronic Obstructive Pulmonary Disease Exacerbation Chronic obstructive pulmonary disease (COPD) is a long-term (chronic) condition that affects the lungs. COPD is a general term that can be used to describe many different lung problems that cause lung swelling (inflammation) and limit airflow, including chronic bronchitis and emphysema. COPD exacerbations are episodes when breathing symptoms become much worse and require extra treatment. COPD exacerbations are usually caused by infections. Without treatment, COPD exacerbations can be severe and even life threatening. Frequent COPD exacerbations can cause further damage to the lungs. What are the causes? This condition may be caused by:  Respiratory infections, including viral and bacterial infections.  Exposure to smoke.  Exposure to air pollution, chemical fumes, or dust.  Things that give you an allergic reaction (allergens).  Not taking your usual COPD medicines as directed.  Underlying medical problems, such as congestive heart failure or infections not involving the lungs.  In many cases, the cause (trigger) of this condition is not known. What increases the risk? The following factors may make you more likely to develop this condition:  Smoking cigarettes.  Old age.  Frequent prior COPD exacerbations.  What are the signs or symptoms? Symptoms of this condition include:  Increased coughing.  Increased production of mucus from your lungs (sputum).  Increased wheezing.  Increased shortness of breath.  Rapid or labored breathing.  Chest tightness.  Less energy than usual.  Sleep disruption from symptoms.  Confusion or increased sleepiness.  Often these symptoms happen or get worse even with the use of medicines. How is this diagnosed? This condition is diagnosed based on:  Your medical history.  A physical exam.  You may also have tests, including:  A chest X-ray.  Blood tests.  Lung (pulmonary)  function tests.  How is this treated? Treatment for this condition depends on the severity and cause of the symptoms. You may need to be admitted to a hospital for treatment. Some of the treatments commonly used to treat COPD exacerbations are:  Antibiotic medicines. These may be used for severe exacerbations caused by a lung infection, such as pneumonia.  Bronchodilators. These are inhaled medicines that expand the air passages and allow increased airflow.  Steroid medicines. These act to reduce inflammation in the airways. They may be given with an inhaler, taken by mouth, or given through an IV tube inserted into one of your veins.  Supplemental oxygen therapy.  Airway clearing techniques, such as noninvasive ventilation (NIV) and positive expiratory pressure (PEP). These provide respiratory support through a mask or other noninvasive device. An example of this would be using a continuous positive airway pressure (CPAP) machine to improve delivery of oxygen into your lungs.  Follow these instructions at home: Medicines  Take over-the-counter and prescription medicines only as told by your health care provider. It is important to use correct technique with inhaled medicines.  If you were prescribed an antibiotic medicine or oral steroid, take it as told by your health care provider. Do not stop taking the medicine even if you start to feel better. Lifestyle  Eat a healthy diet.  Exercise regularly.  Get plenty of sleep.  Avoid exposure to all substances that irritate the airway, especially to tobacco smoke.  Wash your hands often with soap and water to reduce the risk of infection. If soap and water are not available, use hand sanitizer.  During flu season, avoid enclosed spaces that are crowded with people. General instructions  Drink enough fluid to keep your urine clear or pale yellow (  unless you have a medical condition that requires fluid restriction).  Use a cool mist  vaporizer. This humidifies the air and makes it easier for you to clear your chest when you cough.  If you have a home nebulizer and oxygen, continue to use them as told by your health care provider.  Keep all follow-up visits as told by your health care provider. This is important. How is this prevented?  Stay up-to-date on pneumococcal and influenza (flu) vaccines. A flu shot is recommended every year to help prevent exacerbations.  Do not use any products that contain nicotine or tobacco, such as cigarettes and e-cigarettes. Quitting smoking is very important in preventing COPD from getting worse and in preventing exacerbations from happening as often. If you need help quitting, ask your health care provider.  Follow all instructions for pulmonary rehabilitation after a recent exacerbation. This can help prevent future exacerbations.  Work with your health care provider to develop and follow an action plan. This tells you what steps to take when you experience certain symptoms. Contact a health care provider if:  You have a worsening of your regular COPD symptoms. Get help right away if:  You have worsening shortness of breath, even when resting.  You have trouble talking.  You have severe chest pain.  You cough up blood.  You have a fever.  You have weakness, vomit repeatedly, or faint.  You feel confused.  You are not able to sleep because of your symptoms.  You have trouble doing daily activities. Summary  COPD exacerbations are episodes when breathing symptoms become much worse and require extra treatment above your normal treatment.  Exacerbations can be severe and even life threatening. Frequent COPD exacerbations can cause further damage to your lungs.  COPD exacerbations are usually triggered by infections such as the flu, colds, and even pneumonia.  Treatment for this condition depends on the severity and cause of the symptoms. You may need to be admitted to a  hospital for treatment.  Quitting smoking is very important to prevent COPD from getting worse and to prevent exacerbations from happening as often. This information is not intended to replace advice given to you by your health care provider. Make sure you discuss any questions you have with your health care provider. Document Released: 04/27/2007 Document Revised: 08/04/2016 Document Reviewed: 08/04/2016 Elsevier Interactive Patient Education  2018 [employer]. Albuterol inhalation aerosol What is this medicine? ALBUTEROL (al Carl Bell) is a bronchodilator. It helps open up the airways in your lungs to make it easier to breathe. This medicine is used to treat and to prevent bronchospasm. This medicine may be used for other purposes; ask your health care provider or pharmacist if you have questions. COMMON BRAND NAME(S): Proair HFA, Proventil, Proventil HFA, Respirol, Ventolin, Ventolin HFA What should I tell my health care provider before I take this medicine? They need to know if you have any of the following conditions: -diabetes -heart disease or irregular heartbeat -high blood pressure -pheochromocytoma -seizures -thyroid disease -an unusual or allergic reaction to albuterol, levalbuterol, sulfites, other medicines, foods, dyes, or preservatives -pregnant or trying to get pregnant -breast-feeding How should I use this medicine? This medicine is for inhalation through the mouth. Follow the directions on your prescription label. Take your medicine at regular intervals. Do not use more often than directed. Make sure that you are using your inhaler correctly. Ask you doctor or health care provider if you have any questions. Talk to your pediatrician  regarding the use of this medicine in children. Special care may be needed. Overdosage: If you think you have taken too much of this medicine contact a poison control center or emergency room at once. NOTE: This medicine is only for you.  Do not share this medicine with others. What if I miss a dose? If you miss a dose, use it as soon as you can. If it is almost time for your next dose, use only that dose. Do not use double or extra doses. What may interact with this medicine? -anti-infectives like chloroquine and pentamidine -caffeine -cisapride -diuretics -medicines for colds -medicines for depression or for emotional or psychotic conditions -medicines for weight loss including some herbal products -methadone -some antibiotics like clarithromycin, erythromycin, levofloxacin, and linezolid -some heart medicines -steroid hormones like dexamethasone, cortisone, hydrocortisone -theophylline -thyroid hormones This list may not describe all possible interactions. Give your health care provider a list of all the medicines, herbs, non-prescription drugs, or dietary supplements you use. Also tell them if you smoke, drink alcohol, or use illegal drugs. Some items may interact with your medicine. What should I watch for while using this medicine? Tell your doctor or health care professional if your symptoms do not improve. Do not use extra albuterol. If your asthma or bronchitis gets worse while you are using this medicine, call your doctor right away. If your mouth gets dry try chewing sugarless gum or sucking hard candy. Drink water as directed. What side effects may I notice from receiving this medicine? Side effects that you should report to your doctor or health care professional as soon as possible: -allergic reactions like skin rash, itching or hives, swelling of the face, lips, or tongue -breathing problems -chest pain -feeling faint or lightheaded, falls -high blood pressure -irregular heartbeat -fever -muscle cramps or weakness -pain, tingling, numbness in the hands or feet -vomiting Side effects that usually do not require medical attention (report to your doctor or health care professional if they continue or are  bothersome): -cough -difficulty sleeping -headache -nervousness or trembling -stomach upset -stuffy or runny nose -throat irritation -unusual taste This list may not describe all possible side effects. Call your doctor for medical advice about side effects. You may report side effects to FDA at 1-800-FDA-1088. Where should I keep my medicine? Keep out of the reach of children. Store at room temperature between 15 and 30 degrees C (59 and 86 degrees F). The contents are under pressure and may burst when exposed to heat or flame. Do not freeze. This medicine does not work as well if it is too cold. Throw away any unused medicine after the expiration date. Inhalers need to be thrown away after the labeled number of puffs have been used or by the expiration date; whichever comes first. Ventolin HFA should be thrown away 12 months after removing from foil pouch. Check the instructions that come with your medicine. NOTE: This sheet is a summary. It may not cover all possible information. If you have questions about this medicine, talk to your doctor, pharmacist, or health care provider.  2018 Elsevier/Gold Standard (2012-12-16 10:57:17) Azithromycin tablets What is this medicine? AZITHROMYCIN (az ith roe MYE sin) is a macrolide antibiotic. It is used to treat or prevent certain kinds of bacterial infections. It will not work for colds, flu, or other viral infections. This medicine may be used for other purposes; ask your health care provider or pharmacist if you have questions. COMMON BRAND NAME(S): Zithromax, Zithromax Tri-Pak, Zithromax Z-Pak  What should I tell my health care provider before I take this medicine? They need to know if you have any of these conditions: -kidney disease -liver disease -irregular heartbeat or heart disease -an unusual or allergic reaction to azithromycin, erythromycin, other macrolide antibiotics, foods, dyes, or preservatives -pregnant or trying to get  pregnant -breast-feeding How should I use this medicine? Take this medicine by mouth with a full glass of water. Follow the directions on the prescription label. The tablets can be taken with food or on an empty stomach. If the medicine upsets your stomach, take it with food. Take your medicine at regular intervals. Do not take your medicine more often than directed. Take all of your medicine as directed even if you think your are better. Do not skip doses or stop your medicine early. Talk to your pediatrician regarding the use of this medicine in children. While this drug may be prescribed for children as young as 6 months for selected conditions, precautions do apply. Overdosage: If you think you have taken too much of this medicine contact a poison control center or emergency room at once. NOTE: This medicine is only for you. Do not share this medicine with others. What if I miss a dose? If you miss a dose, take it as soon as you can. If it is almost time for your next dose, take only that dose. Do not take double or extra doses. What may interact with this medicine? Do not take this medicine with any of the following medications: -lincomycin This medicine may also interact with the following medications: -amiodarone -antacids -birth control pills -cyclosporine -digoxin -magnesium -nelfinavir -phenytoin -warfarin This list may not describe all possible interactions. Give your health care provider a list of all the medicines, herbs, non-prescription drugs, or dietary supplements you use. Also tell them if you smoke, drink alcohol, or use illegal drugs. Some items may interact with your medicine. What should I watch for while using this medicine? Tell your doctor or healthcare professional if your symptoms do not start to get better or if they get worse. Do not treat diarrhea with over the counter products. Contact your doctor if you have diarrhea that lasts more than 2 days or if it is  severe and watery. This medicine can make you more sensitive to the sun. Keep out of the sun. If you cannot avoid being in the sun, wear protective clothing and use sunscreen. Do not use sun lamps or tanning beds/booths. What side effects may I notice from receiving this medicine? Side effects that you should report to your doctor or health care professional as soon as possible: -allergic reactions like skin rash, itching or hives, swelling of the face, lips, or tongue -confusion, nightmares or hallucinations -dark urine -difficulty breathing -hearing loss -irregular heartbeat or chest pain -pain or difficulty passing urine -redness, blistering, peeling or loosening of the skin, including inside the mouth -white patches or sores in the mouth -yellowing of the eyes or skin Side effects that usually do not require medical attention (report to your doctor or health care professional if they continue or are bothersome): -diarrhea -dizziness, drowsiness -headache -stomach upset or vomiting -tooth discoloration -vaginal irritation This list may not describe all possible side effects. Call your doctor for medical advice about side effects. You may report side effects to FDA at 1-800-FDA-1088. Where should I keep my medicine? Keep out of the reach of children. Store at room temperature between 15 and 30 degrees C (59 and 86  degrees F). Throw away any unused medicine after the expiration date. NOTE: This sheet is a summary. It may not cover all possible information. If you have questions about this medicine, talk to your doctor, pharmacist, or health care provider.  2018 Elsevier/Gold Standard (2015-08-28 15:26:03) Prednisone tablets What is this medicine? PREDNISONE (PRED ni sone) is a corticosteroid. It is commonly used to treat inflammation of the skin, joints, lungs, and other organs. Common conditions treated include asthma, allergies, and arthritis. It is also used for other conditions,  such as blood disorders and diseases of the adrenal glands. This medicine may be used for other purposes; ask your health care provider or pharmacist if you have questions. COMMON BRAND NAME(S): Deltasone, Predone, Sterapred, Sterapred DS What should I tell my health care provider before I take this medicine? They need to know if you have any of these conditions: -Cushing's syndrome -diabetes -glaucoma -heart disease -high blood pressure -infection (especially a virus infection such as chickenpox, cold sores, or herpes) -kidney disease -liver disease -mental illness -myasthenia gravis -osteoporosis -seizures -stomach or intestine problems -thyroid disease -an unusual or allergic reaction to lactose, prednisone, other medicines, foods, dyes, or preservatives -pregnant or trying to get pregnant -breast-feeding How should I use this medicine? Take this medicine by mouth with a glass of water. Follow the directions on the prescription label. Take this medicine with food. If you are taking this medicine once a day, take it in the morning. Do not take more medicine than you are told to take. Do not suddenly stop taking your medicine because you may develop a severe reaction. Your doctor will tell you how much medicine to take. If your doctor wants you to stop the medicine, the dose may be slowly lowered over time to avoid any side effects. Talk to your pediatrician regarding the use of this medicine in children. Special care may be needed. Overdosage: If you think you have taken too much of this medicine contact a poison control center or emergency room at once. NOTE: This medicine is only for you. Do not share this medicine with others. What if I miss a dose? If you miss a dose, take it as soon as you can. If it is almost time for your next dose, talk to your doctor or health care professional. You may need to miss a dose or take an extra dose. Do not take double or extra doses without  advice. What may interact with this medicine? Do not take this medicine with any of the following medications: -metyrapone -mifepristone This medicine may also interact with the following medications: -aminoglutethimide -amphotericin B -aspirin and aspirin-like medicines -barbiturates -certain medicines for diabetes, like glipizide or glyburide -cholestyramine -cholinesterase inhibitors -cyclosporine -digoxin -diuretics -ephedrine -male hormones, like estrogens and birth control pills -isoniazid -ketoconazole -NSAIDS, medicines for pain and inflammation, like ibuprofen or naproxen -phenytoin -rifampin -toxoids -vaccines -warfarin This list may not describe all possible interactions. Give your health care provider a list of all the medicines, herbs, non-prescription drugs, or dietary supplements you use. Also tell them if you smoke, drink alcohol, or use illegal drugs. Some items may interact with your medicine. What should I watch for while using this medicine? Visit your doctor or health care professional for regular checks on your progress. If you are taking this medicine over a prolonged period, carry an identification card with your name and address, the type and dose of your medicine, and your doctor's name and address. This medicine may increase your risk  of getting an infection. Tell your doctor or health care professional if you are around anyone with measles or chickenpox, or if you develop sores or blisters that do not heal properly. If you are going to have surgery, tell your doctor or health care professional that you have taken this medicine within the last twelve months. Ask your doctor or health care professional about your diet. You may need to lower the amount of salt you eat. This medicine may affect blood sugar levels. If you have diabetes, check with your doctor or health care professional before you change your diet or the dose of your diabetic medicine. What  side effects may I notice from receiving this medicine? Side effects that you should report to your doctor or health care professional as soon as possible: -allergic reactions like skin rash, itching or hives, swelling of the face, lips, or tongue -changes in emotions or moods -changes in vision -depressed mood -eye pain -fever or chills, cough, sore throat, pain or difficulty passing urine -increased thirst -swelling of ankles, feet Side effects that usually do not require medical attention (report to your doctor or health care professional if they continue or are bothersome): -confusion, excitement, restlessness -headache -nausea, vomiting -skin problems, acne, thin and shiny skin -trouble sleeping -weight gain This list may not describe all possible side effects. Call your doctor for medical advice about side effects. You may report side effects to FDA at 1-800-FDA-1088. Where should I keep my medicine? Keep out of the reach of children. Store at room temperature between 15 and 30 degrees C (59 and 86 degrees F). Protect from light. Keep container tightly closed. Throw away any unused medicine after the expiration date. NOTE: This sheet is a summary. It may not cover all possible information. If you have questions about this medicine, talk to your doctor, pharmacist, or health care provider.  2018 Elsevier/Gold Standard (2011-02-13 10:57:14) Tiotropium inhalation powder What is this medicine? TIOTROPIUM (tee oh TRO pee um) is a bronchodilator. It helps open up the airways in your lungs to make it easier to breathe. This medicine is used to treat chronic obstructive pulmonary disease (COPD), including emphysema and chronic bronchitis. Do not use this medicine for an acute attack. This medicine may be used for other purposes; ask your health care provider or pharmacist if you have questions. COMMON BRAND NAME(S): Spiriva HandiHaler What should I tell my health care provider before I  take this medicine? They need to know if you have any of these conditions: -bladder problems or difficulty passing urine -glaucoma -kidney disease -prostate trouble -an unusual or allergic reaction to tiotropium, ipratropium, atropine, other medicines, lactose, foods, dyes, or preservatives -pregnant or trying to get pregnant -breast-feeding How should I use this medicine? This medicine is used in a special inhaler. Do NOT swallow the capsules. Do NOT use a spacer device. Follow the directions on the prescription label. Take your medicine at regular intervals. Do not take it more often than directed. Do not stop taking except on your doctor's advice. Make sure that you are using your inhaler correctly. Ask your doctor or health care provider if you have any questions. Small pieces of the capsule may get in your mouth or throat when you breathe in your medicine. This is normal and should not hurt you. Talk to your pediatrician regarding the use of this medicine in children. Special care may be needed. Overdosage: If you think you have taken too much of this medicine contact a poison  control center or emergency room at once. NOTE: This medicine is only for you. Do not share this medicine with others. What if I miss a dose? If you miss a dose, use it as soon as you can. If it is almost time for your next dose, use only that dose and continue with your regular schedule. Do not use double or extra doses. What may interact with this medicine? This medicine may also interact with the following medications: -atropine -antihistamines for allergy, cough and cold -certain medicines for bladder problems like oxybutynin, tolterodine -certain medicines for stomach problems like dicyclomine, hyoscyamine -certain medicines for travel sickness like scopolamine -certain medicines for Parkinson's disease like benztropine, trihexyphenidyl -ipratropium This list may not describe all possible interactions. Give  your health care provider a list of all the medicines, herbs, non-prescription drugs, or dietary supplements you use. Also tell them if you smoke, drink alcohol, or use illegal drugs. Some items may interact with your medicine. What should I watch for while using this medicine? Visit your doctor or health care professional for regular checks on your progress. Tell your doctor if your symptoms do not improve. Do not use more medicine than directed. If your symptoms get worse while you are using this medicine, call your doctor right away. Do not get the this medicine in your eyes. It can cause irritation, pain, or blurred vision. You may get dizzy. Do not drive, use machinery, or do anything that needs mental alertness until you know how this medicine affects you. Do not stand or sit up quickly, especially if you are an older patient. This reduces the risk of dizzy or fainting spells. Clean the inhaler as directed in the patient information sheet that comes with this medicine. What side effects may I notice from receiving this medicine? Side effects that you should report to your doctor or health care professional as soon as possible: -allergic reactions like skin rash or hives, swelling of the face, lips, or tongue -breathing problems -changes in vision -chest pain -fast heartbeat -infection or flu-like symptoms -trouble passing urine or change in the amount of urine Side effects that usually do not require medical attention (report to your doctor or health care professional if they continue or are bothersome): -constipation -cough -dizziness -dry mouth -headache -muscle pain -sore throat -stomach upset This list may not describe all possible side effects. Call your doctor for medical advice about side effects. You may report side effects to FDA at 1-800-FDA-1088. Where should I keep my medicine? Keep out of the reach of children. Store at room temperature between 15 and 30 degrees C (59  and 86 degrees F). Protect from humidity. Keep capsules in the foil pack until you are ready to use. Throw away any unused medicine after the expiration date. NOTE: This sheet is a summary. It may not cover all possible information. If you have questions about this medicine, talk to your doctor, pharmacist, or health care provider.  2018 Elsevier/Gold Standard (2014-04-12 13:19:07)

## 2017-08-14 NOTE — Progress Notes (Signed)
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 Grayland Ormond, on 04/08/17.  He has completed concurrent chemotherapy and radiation and only received 1 infusion of consolidation chemotherapy on December 27, 2015.  Chemo was discontinued secondary to persistent pancytopenia.  Repeat CT on 04/03/17 revealed no progressive or recurrent disease.  Imaging repeated approximately every 6 months until June 2019 when patient is 2 years of treatment and switching to yearly imaging.  Patient is scheduled to follow-up with Dr. Grayland Ormond in March 2019.  Interval history-patient presents to symptom management for ongoing shortness of breath.  He has been evaluated in the emergency room twice in the past month for same.  He states that he does not regularly see his PCP.  Reports that symptoms initially started with a "cold".  States he has a history of COPD.  He states that he was prescribed Mucinex which have not improved his symptoms.  Is concerned that his cold "is going to my chest".  Denies fever and chills.  Symptoms have been ongoing for approximately 1 month and have changed location and mildly worsened to unchanged.  Does not believe he has taken antibiotics recently.  Had imaging in ER.   ECOG FS:1 - Symptomatic but completely ambulatory  Review of systems- Review of Systems  Constitutional: Negative for chills, fever, malaise/fatigue and weight loss.  HENT: Negative.   Eyes: Negative.   Respiratory: Positive for cough,  sputum production, shortness of breath and wheezing.   Cardiovascular: Negative.   Gastrointestinal: Negative.   Genitourinary: Negative.   Musculoskeletal: Negative.   Skin: Negative.   Neurological: Negative.  Negative for weakness.  Endo/Heme/Allergies: Negative.   Psychiatric/Behavioral: Negative.      Current treatment-surveillance  No Known Allergies   Past Medical History:  Diagnosis Date  . Cancer ([place])   . COPD (chronic obstructive pulmonary disease) ([place])   . Glaucoma   . Hypertension      Past Surgical History:  Procedure Laterality Date  . LUNG BIOPSY    . PERIPHERAL VASCULAR CATHETERIZATION N/A 09/24/2015   Procedure: [name];  Surgeon: Algernon Huxley, MD;  Location: [place] [facility];  Service: Cardiovascular;  Laterality: N/A;  . Port a cath placement      Social History   Socioeconomic History  . Marital status: Legally Separated    Spouse name: Not on file  . Number of children: Not on file  . Years of education: Not on file  . Highest education level: Not on file  Social Needs  . Financial resource strain: Not on file  . Food insecurity - worry: Not on file  . Food insecurity - inability: Not on file  . Transportation needs - medical: Not on file  . Transportation needs - non-medical: Not on file  Occupational History  . Not on file  Tobacco Use  . Smoking status: Current Every Day Smoker    Packs/day: 0.50    Years: 50.00    Pack years: 25.00    Types: Cigarettes  . Smokeless tobacco:  Never Used  Substance and Sexual Activity  . Alcohol use: Yes    Alcohol/week: 0.0 oz    Comment: 3 pints/week  . Drug use: No  . Sexual activity: Not on file  Other Topics Concern  . Not on file  Social History Narrative  . Not on file    Family History  Problem Relation Age of Onset  . Diabetes Mellitus II Mother   . Hypertension Father      Current Outpatient Medications:  .  albuterol (PROVENTIL HFA;VENTOLIN HFA) 108 (90 BASE)  MCG/ACT inhaler, Inhale 2 puffs into the lungs every 6 (six) hours as needed for wheezing or shortness of breath., Disp: 1 Inhaler, Rfl: 0 .  guaiFENesin (MUCINEX) 600 MG 12 hr tablet, Take 1 tablet (600 mg total) by mouth 2 (two) times daily for 20 days., Disp: 40 tablet, Rfl: 0 .  lidocaine-prilocaine (EMLA) cream, Apply 1 application topically as needed. Apply to port 1 hour prior to chemotherapy appointment. Cover with plastic wrap., Disp: 30 g, Rfl: 2 .  megestrol (MEGACE) 40 MG tablet, Take 1 tablet (40 mg total) by mouth daily., Disp: 30 tablet, Rfl: 1 .  travoprost, benzalkonium, (TRAVATAN) 0.004 % ophthalmic solution, Place 1 drop into both eyes at bedtime., Disp: , Rfl:   Physical exam:  Vitals:   08/14/17 1017  BP: 133/81  Pulse: (!) 102  Resp: 16  Temp: 98.5 F (36.9 C)  TempSrc: Tympanic  SpO2: 97%  Weight: 150 lb 14.4 oz (68.4 kg)  Height: 6' 1"  (1.854 m)   Physical Exam  Constitutional: He is oriented to person, place, and time and well-developed, well-nourished, and in no distress.  HENT:  Head: Normocephalic and atraumatic.  Mouth/Throat: Oropharynx is clear and moist.  Eyes: Pupils are equal, round, and reactive to light.  Neck: Normal range of motion.  Cardiovascular: Normal rate and regular rhythm.  Pulmonary/Chest: Effort normal. He has wheezes.  Musculoskeletal: Normal range of motion.  Neurological: He is alert and oriented to person, place, and time.  Skin: Skin is dry.  Psychiatric: Affect normal.     CMP Latest Ref Rng & Units 08/08/2017  Glucose 65 - 99 mg/dL 106(H)  BUN 6 - 20 mg/dL 11  Creatinine 0.61 - 1.24 mg/dL 0.82  Sodium 135 - 145 mmol/L 142  Potassium 3.5 - 5.1 mmol/L 3.4(L)  Chloride 101 - 111 mmol/L 103  CO2 22 - 32 mmol/L 30  Calcium 8.9 - 10.3 mg/dL 9.0  Total Protein 6.5 - 8.1 g/dL -  Total Bilirubin 0.3 - 1.2 mg/dL -  Alkaline Phos 38 - 126 U/L -  AST 15 - 41 U/L -  ALT 17 - 63 U/L -   CBC Latest Ref Rng & Units 08/08/2017  WBC  3.8 - 10.6 K/uL 4.9  Hemoglobin 13.0 - 18.0 g/dL 11.5(L)  Hematocrit 40.0 - 52.0 % 35.2(L)  Platelets 150 - 440 K/uL 236     Dg Chest 2 View  Result Date: 08/08/2017 CLINICAL DATA:  Shortness of breath. EXAM: CHEST  2 VIEW COMPARISON:  CT scan 07/23/2017.  Chest x-ray 07/23/2017. FINDINGS: Lungs are hyperexpanded. The lungs are clear without focal pneumonia, edema, pneumothorax or pleural effusion. Linear scarring in the lingula is stable. The cardiopericardial silhouette is within normal limits for size. Right Port-A-Cath tip overlies the mid SVC. The visualized bony structures of the thorax are intact. IMPRESSION: No active cardiopulmonary disease. Electronically Signed   By: Misty Stanley M.D.   On: 08/08/2017 15:47  Dg Chest 2 View  Result Date: 07/23/2017 CLINICAL DATA:  Shortness of breath and productive cough. History of COPD and lung cancer. EXAM: CHEST  2 VIEW COMPARISON:  12/23/2015 FINDINGS: Right IJ central venous catheter unchanged with tip over the SVC. Lungs are hyperexpanded with emphysematous disease. Flattening of the hemidiaphragms on the lateral film. Linear scarring/atelectasis over the anterior left midlung. No acute airspace process or effusion. Cardiomediastinal silhouette and remainder of the exam is unchanged. IMPRESSION: No acute findings. Left anterior mid lung scarring versus linear atelectasis.  COPD. Right IJ Port-A-Cath unchanged. Electronically Signed   By: Marin Olp M.D.   On: 07/23/2017 20:13   Ct Angio Chest Pe W And/or Wo Contrast  Result Date: 07/23/2017 CLINICAL DATA:  67 year old male with shortness of breath and productive cough. History of COPD and lung cancer. EXAM: CT ANGIOGRAPHY CHEST WITH CONTRAST TECHNIQUE: Multidetector CT imaging of the chest was performed using the standard protocol during bolus administration of intravenous contrast. Multiplanar CT image reconstructions and MIPs were obtained to evaluate the vascular anatomy. CONTRAST:  77m  ISOVUE-370 IOPAMIDOL (ISOVUE-370) INJECTION 76% COMPARISON:  Chest radiograph dated 07/23/2017 and CT dated 04/03/2017 FINDINGS: Cardiovascular: There is no cardiomegaly. Coronary vascular calcification primarily involving the LAD. There is a small pericardial effusion measuring up to 1 cm in thickness grossly similar to prior CT. There is mild atherosclerotic calcification of the thoracic aorta. No aneurysmal dilatation or evidence of dissection. There is atherosclerotic calcifications of the origins of the great vessels of the aortic arch. There is no CT evidence of pulmonary embolism. Right pectoral Port-A-Cath with tip in central SVC. Mediastinum/Nodes: No hilar or mediastinal adenopathy. The esophagus is grossly unremarkable. Lungs/Pleura: There is emphysematous changes of the lungs. Linear and platelike scarring noted in the left upper lobe and along the left fissure as seen on the prior CT. No new consolidative changes. There is no pleural effusion or pneumothorax. The central airways are patent. Upper Abdomen: No acute abnormality. Musculoskeletal: The osseous structures appear unremarkable. Review of the MIP images confirms the above findings. IMPRESSION: 1. No acute intrathoracic pathology. No CT evidence of pulmonary embolism. 2. Stable linear scarring in the left upper lobe. 3. Emphysema. 4. No evidence of recurrent or metastatic disease in the chest. 5. Aortic Atherosclerosis (ICD10-I70.0) and Emphysema (ICD10-J43.9). Electronically Signed   By: AAnner CreteM.D.   On: 07/23/2017 21:42     Assessment and plan- Patient is a 67y.o. male who presents to symptom management clinic for shortness of breath.  1.  Stage IIIa squamous cell carcinoma of lingula of lung-currently on surveillance.  Recent imaging reviewed.  No evidence of recurrent or metastatic disease at this time.  Repeat imaging with Dr. FGrayland Ormondscheduled for 10/05/17.  Continue to follow-up with Dr. FGrayland Ormond   2.  COPD  exacerbation-discussed with patient that symptoms are likely reflective of chronic process with acute exacerbation.  Given length of time of symptoms will initiate on antibiotics and steroids.  Will refill albuterol inhaler for acute symptoms.  Feel patient would benefit from initiation of maintenance medication such as Spiriva and management of chronic diseases (COPD & Emphysema) PCP or pulmonology.  Patient requests referral to pulmonology.  Patient called clinic to expressed concern with cost of Spiriva.  I personally spoke to pharmacist who confirms the patient has not yet met deductible.  Pharmacist agrees to discuss this with patient.  Patient may require co-pay assistance; pharmacist states that he doubts patient will fill medication d/t cost- lack  of maintenance medications combined with continued smoking will likely lead to additional ER visits and exacerbations.   3. Tobacco cessation-unfortunately patient continues to smoke.  Discussed role of chronic smoking and exacerbations of chronic COPD and emphysema.  Encourage patient to follow-up with PCP for smoking cessation or enrollment in quit Smart program.  Patient states not interested at this time.  Visit Diagnosis 1. COPD exacerbation ([facility])   2. Lung cancer, lingula ([place])   3. Tobacco dependence due to cigarettes     Patient expressed understanding and was in agreement with this plan. He also understands that He can call clinic at any time with any questions, concerns, or complaints.  A total of (30) minutes of face-to-face time was spent with this patient with greater than 50% of that time in counseling and care-coordination.   Beckey Rutter, DNP, AGNP-C [place] at [facility] [phone] (office) 08/14/17 10:46 AM

## 2017-08-14 NOTE — Progress Notes (Signed)
Patient here for symptom management. He reports that he has been feeling congested and short of breath for the past few weeks. He went to the emergency room twice for acute dyspnea. He was he was given mucinex and discharge home. His cough is productive he reports thick yellow sputum.

## 2017-08-17 ENCOUNTER — Encounter: Payer: Self-pay | Admitting: Nurse Practitioner

## 2017-10-04 NOTE — Progress Notes (Signed)
[place]  Telephone:(336) [phone] Fax:(336) [phone]  ID: ESIAH BAZINET OB: May 18, 1951  MR#: [id number]  VEL#:[id number]  Patient Care Team: Kirk Ruths, MD as PCP - General (Internal Medicine) Leona Singleton, RN as Oncology Nurse Navigator  CHIEF COMPLAINT: Stage IIIa squamous cell carcinoma of the lingula of lung.  INTERVAL HISTORY: Patient returns to clinic today for further evaluation and discussion of his imaging results. He continues to feel well. He does not complain of shortness of breath today, but continues to have chronic nonproductive cough. He has no neurologic complaints. He denies any recent fevers. He denies chest pain or hemoptysis.  He denies any nausea, vomiting, constipation, or diarrhea. He has no urinary complaints. Patient offers no further specific complaints  REVIEW OF SYSTEMS:   Review of Systems  Constitutional: Negative.  Negative for fever, malaise/fatigue and weight loss.  Respiratory: Positive for cough. Negative for hemoptysis and shortness of breath.   Cardiovascular: Negative.  Negative for chest pain and leg swelling.  Gastrointestinal: Negative.  Negative for abdominal pain, blood in stool and melena.  Genitourinary: Negative.   Musculoskeletal: Negative.   Skin: Negative.  Negative for rash.  Neurological: Negative.  Negative for sensory change, focal weakness and weakness.  Endo/Heme/Allergies: Does not bruise/bleed easily.  Psychiatric/Behavioral: Negative.  The patient is not nervous/anxious.     As per HPI. Otherwise, a complete review of systems is negative.  PAST MEDICAL HISTORY: Past Medical History:  Diagnosis Date  . COPD (chronic obstructive pulmonary disease) ([place])   . Glaucoma   . Hypertension   . Lung cancer ([place])     PAST SURGICAL HISTORY: Past Surgical History:  Procedure Laterality Date  . LUNG BIOPSY    . PERIPHERAL VASCULAR CATHETERIZATION N/A 09/24/2015   Procedure: [name];   Surgeon: Algernon Huxley, MD;  Location: [place] [facility];  Service: Cardiovascular;  Laterality: N/A;  . Port a cath placement      FAMILY HISTORY: Reviewed and unchanged. No reported history of malignancy or chronic disease.     ADVANCED DIRECTIVES:    HEALTH MAINTENANCE: Social History   Tobacco Use  . Smoking status: Current Every Day Smoker    Packs/day: 0.50    Years: 50.00    Pack years: 25.00    Types: Cigarettes  . Smokeless tobacco: Never Used  Substance Use Topics  . Alcohol use: Yes    Alcohol/week: 0.0 oz    Comment: 3 pints/week  . Drug use: No     No Known Allergies  Current Outpatient Medications  Medication Sig Dispense Refill  . albuterol (PROVENTIL HFA;VENTOLIN HFA) 108 (90 Base) MCG/ACT inhaler Inhale 2 puffs into the lungs every 6 (six) hours as needed for wheezing or shortness of breath. 1 Inhaler 3  . lidocaine-prilocaine (EMLA) cream Apply 1 application topically as needed. Apply to port 1 hour prior to chemotherapy appointment. Cover with plastic wrap. 30 g 2  . travoprost, benzalkonium, (TRAVATAN) 0.004 % ophthalmic solution Place 1 drop into both eyes at bedtime.     No current facility-administered medications for this visit.     OBJECTIVE: Vitals:   10/07/17 1059  BP: (!) 189/93  Pulse: (!) 104  Temp: 97.6 F (36.4 C)     Body mass index is 20.24 kg/m.    ECOG FS:0 - Asymptomatic  General: Well-developed, well-nourished, no acute distress. Eyes: Pink conjunctiva, anicteric sclera. Lungs: Clear to auscultation bilaterally. Heart: Regular rate and rhythm. No rubs, murmurs, or  gallops. Abdomen: Soft, nontender, nondistended. No organomegaly noted, normoactive bowel sounds. Musculoskeletal: No edema, cyanosis, or clubbing. Neuro: Alert, answering all questions appropriately. Cranial nerves grossly intact. Skin: No rashes or petechiae noted. Psych: Normal affect.   LAB RESULTS:  Lab Results  Component Value Date   NA 142  08/08/2017   K 3.4 (L) 08/08/2017   CL 103 08/08/2017   CO2 30 08/08/2017   GLUCOSE 106 (H) 08/08/2017   BUN 11 08/08/2017   CREATININE 0.80 10/05/2017   CALCIUM 9.0 08/08/2017   PROT 6.6 04/03/2017   ALBUMIN 3.6 04/03/2017   AST 55 (H) 04/03/2017   ALT 25 04/03/2017   ALKPHOS 52 04/03/2017   BILITOT 0.7 04/03/2017   GFRNONAA >60 08/08/2017   GFRAA >60 08/08/2017    Lab Results  Component Value Date   WBC 4.9 08/08/2017   NEUTROABS 1.7 04/03/2017   HGB 11.5 (L) 08/08/2017   HCT 35.2 (L) 08/08/2017   MCV 84.2 08/08/2017   PLT 236 08/08/2017     STUDIES: Ct Chest W Contrast  Result Date: 10/05/2017 CLINICAL DATA:  The lung cancer. Chemotherapy and radiation therapy. EXAM: CT CHEST WITH CONTRAST TECHNIQUE: Multidetector CT imaging of the chest was performed during intravenous contrast administration. CONTRAST:  77mL ISOVUE-300 IOPAMIDOL (ISOVUE-300) INJECTION 61% COMPARISON:  07/23/2017 and PET 02/28/2016. FINDINGS: Cardiovascular: Right IJ Port-A-Cath terminates in the SVC. Atherosclerotic calcification of the arterial vasculature, including coronary arteries. Heart size normal. Small amount of pericardial fluid is unchanged and likely physiologic. Mediastinum/Nodes: Mediastinal lymph nodes are not enlarged by CT size criteria. No hilar or axillary adenopathy. Esophagus is grossly unremarkable. Lungs/Pleura: Centrilobular and paraseptal emphysema with biapical scarring. Post treatment scarring and volume loss in lingula. No pleural fluid. Airway is unremarkable. Upper Abdomen: Subcentimeter low-attenuation lesion liver is too small to characterize but stable. Visualized portions of the liver, adrenal glands and right kidney are unremarkable. Scarring in the left kidney. Peripherally calcified 2.9 cm low-attenuation lesion in the medial left kidney is stable in size from 02/28/2016. Visualized portions of the spleen, pancreas, stomach and bowel are grossly unremarkable. No upper  abdominal adenopathy. Musculoskeletal: Degenerative changes in the spine. No worrisome lytic or sclerotic lesions. IMPRESSION: 1. Post treatment scarring in the lingula. No evidence of recurrent or metastatic disease. 2. Aortic atherosclerosis (ICD10-170.0). Coronary artery calcification. 3.  Emphysema (ICD10-J43.9). Electronically Signed   By: Lorin Picket M.D.   On: 10/05/2017 11:18    ASSESSMENT: Stage IIIa squamous cell carcinoma of the lingula of lung.  PLAN:    1. 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 Patient only received one infusion of consolidation chemotherapy on December 27, 2015, but this was discontinued secondary to persistent pancytopenia.  Restaging CT scan on October 05, 2017 reviewed independently and reported as above with no concern of progressive or recurrent disease. Continue imaging approximately every 6 months until June 2019 when patient is 2 years removed from completing his treatment at which time he can be switched to yearly imaging. Return to clinic in 6 months with repeat CT scan and further evaluation. 2. Leukopenia: Mild, monitor.  3.  Hypertension: Patient blood pressure significantly elevated today.  Continue monitoring and treatment per primary care. 4. Cough: Chronic and unchanged, monitor.  Patient expressed understanding and was in agreement with this plan. He also understands that He can call clinic at any time with any questions, concerns, or complaints.   Lloyd Huger, MD 10/10/17  10:48 AM

## 2017-10-05 ENCOUNTER — Ambulatory Visit
Admission: RE | Admit: 2017-10-05 | Discharge: 2017-10-05 | Disposition: A | Discharge status: Home / Self Care | Payer: Medicare HMO | Source: Ambulatory Visit | Attending: Oncology | Admitting: Oncology

## 2017-10-05 DIAGNOSIS — C3410 Malignant neoplasm of upper lobe, unspecified bronchus or lung: Secondary | ICD-10-CM | POA: Diagnosis present

## 2017-10-05 DIAGNOSIS — J984 Other disorders of lung: Secondary | ICD-10-CM | POA: Diagnosis not present

## 2017-10-05 DIAGNOSIS — J439 Emphysema, unspecified: Secondary | ICD-10-CM | POA: Insufficient documentation

## 2017-10-05 DIAGNOSIS — I700 Atherosclerosis of aorta: Secondary | ICD-10-CM | POA: Diagnosis not present

## 2017-10-05 DIAGNOSIS — I2510 Atherosclerotic heart disease of native coronary artery without angina pectoris: Secondary | ICD-10-CM | POA: Insufficient documentation

## 2017-10-05 DIAGNOSIS — C3490 Malignant neoplasm of unspecified part of unspecified bronchus or lung: Secondary | ICD-10-CM | POA: Diagnosis not present

## 2017-10-05 HISTORY — DX: Malignant neoplasm of unspecified part of unspecified bronchus or lung: C34.90

## 2017-10-05 LAB — POCT I-STAT CREATININE: CREATININE: 0.8 mg/dL (ref 0.61–1.24)

## 2017-10-05 MED ORDER — IOPAMIDOL (ISOVUE-300) INJECTION 61%
75.0000 mL | Freq: Once | INTRAVENOUS | Status: AC | PRN
  Administered 2017-10-05: 75 mL via INTRAVENOUS

## 2017-10-06 ENCOUNTER — Other Ambulatory Visit: Payer: Self-pay | Admitting: *Deleted

## 2017-10-06 DIAGNOSIS — C3490 Malignant neoplasm of unspecified part of unspecified bronchus or lung: Secondary | ICD-10-CM

## 2017-10-07 ENCOUNTER — Other Ambulatory Visit: Payer: Medicare Other

## 2017-10-07 ENCOUNTER — Inpatient Hospital Stay: Payer: MEDICARE | Attending: Oncology | Admitting: Oncology

## 2017-10-07 ENCOUNTER — Other Ambulatory Visit: Payer: Self-pay

## 2017-10-07 ENCOUNTER — Ambulatory Visit: Payer: Medicare Other | Admitting: Oncology

## 2017-10-07 VITALS — BP 189/93 | HR 104 | Temp 97.6°F | Wt 153.4 lb

## 2017-10-07 DIAGNOSIS — Z9889 Other specified postprocedural states: Secondary | ICD-10-CM | POA: Diagnosis not present

## 2017-10-07 DIAGNOSIS — I10 Essential (primary) hypertension: Secondary | ICD-10-CM | POA: Insufficient documentation

## 2017-10-07 DIAGNOSIS — Z85118 Personal history of other malignant neoplasm of bronchus and lung: Secondary | ICD-10-CM | POA: Diagnosis not present

## 2017-10-07 DIAGNOSIS — F17210 Nicotine dependence, cigarettes, uncomplicated: Secondary | ICD-10-CM | POA: Insufficient documentation

## 2017-10-07 DIAGNOSIS — Z9221 Personal history of antineoplastic chemotherapy: Secondary | ICD-10-CM | POA: Insufficient documentation

## 2017-10-07 DIAGNOSIS — D72819 Decreased white blood cell count, unspecified: Secondary | ICD-10-CM | POA: Diagnosis not present

## 2017-10-07 DIAGNOSIS — Z79899 Other long term (current) drug therapy: Secondary | ICD-10-CM | POA: Diagnosis not present

## 2017-10-07 DIAGNOSIS — Z08 Encounter for follow-up examination after completed treatment for malignant neoplasm: Secondary | ICD-10-CM | POA: Diagnosis not present

## 2017-10-07 DIAGNOSIS — J449 Chronic obstructive pulmonary disease, unspecified: Secondary | ICD-10-CM | POA: Diagnosis not present

## 2017-10-07 DIAGNOSIS — R05 Cough: Secondary | ICD-10-CM | POA: Diagnosis not present

## 2017-10-07 DIAGNOSIS — C3410 Malignant neoplasm of upper lobe, unspecified bronchus or lung: Secondary | ICD-10-CM | POA: Diagnosis not present

## 2017-10-07 DIAGNOSIS — D61818 Other pancytopenia: Secondary | ICD-10-CM | POA: Insufficient documentation

## 2017-10-07 DIAGNOSIS — R69 Illness, unspecified: Secondary | ICD-10-CM | POA: Diagnosis not present

## 2017-11-10 ENCOUNTER — Emergency Department: Payer: Medicare HMO

## 2017-11-10 ENCOUNTER — Encounter: Payer: Self-pay | Admitting: Emergency Medicine

## 2017-11-10 ENCOUNTER — Other Ambulatory Visit: Payer: Self-pay

## 2017-11-10 ENCOUNTER — Inpatient Hospital Stay
Admission: EM | Admit: 2017-11-10 | Discharge: 2017-11-12 | DRG: 196 | Disposition: A | Discharge status: Home / Self Care | Payer: Medicare HMO | Attending: Internal Medicine | Admitting: Internal Medicine

## 2017-11-10 DIAGNOSIS — Z9221 Personal history of antineoplastic chemotherapy: Secondary | ICD-10-CM | POA: Diagnosis not present

## 2017-11-10 DIAGNOSIS — H409 Unspecified glaucoma: Secondary | ICD-10-CM | POA: Diagnosis present

## 2017-11-10 DIAGNOSIS — J189 Pneumonia, unspecified organism: Secondary | ICD-10-CM | POA: Diagnosis not present

## 2017-11-10 DIAGNOSIS — J441 Chronic obstructive pulmonary disease with (acute) exacerbation: Secondary | ICD-10-CM

## 2017-11-10 DIAGNOSIS — F17200 Nicotine dependence, unspecified, uncomplicated: Secondary | ICD-10-CM | POA: Diagnosis present

## 2017-11-10 DIAGNOSIS — Z833 Family history of diabetes mellitus: Secondary | ICD-10-CM

## 2017-11-10 DIAGNOSIS — E43 Unspecified severe protein-calorie malnutrition: Secondary | ICD-10-CM

## 2017-11-10 DIAGNOSIS — J439 Emphysema, unspecified: Secondary | ICD-10-CM | POA: Diagnosis present

## 2017-11-10 DIAGNOSIS — Z79899 Other long term (current) drug therapy: Secondary | ICD-10-CM | POA: Diagnosis not present

## 2017-11-10 DIAGNOSIS — J9600 Acute respiratory failure, unspecified whether with hypoxia or hypercapnia: Secondary | ICD-10-CM | POA: Diagnosis present

## 2017-11-10 DIAGNOSIS — Z85118 Personal history of other malignant neoplasm of bronchus and lung: Secondary | ICD-10-CM

## 2017-11-10 DIAGNOSIS — R069 Unspecified abnormalities of breathing: Secondary | ICD-10-CM | POA: Diagnosis not present

## 2017-11-10 DIAGNOSIS — J8489 Other specified interstitial pulmonary diseases: Principal | ICD-10-CM | POA: Diagnosis present

## 2017-11-10 DIAGNOSIS — J9601 Acute respiratory failure with hypoxia: Secondary | ICD-10-CM | POA: Diagnosis present

## 2017-11-10 DIAGNOSIS — R0600 Dyspnea, unspecified: Secondary | ICD-10-CM | POA: Diagnosis not present

## 2017-11-10 DIAGNOSIS — R000 Tachycardia, unspecified: Secondary | ICD-10-CM | POA: Diagnosis not present

## 2017-11-10 DIAGNOSIS — Z923 Personal history of irradiation: Secondary | ICD-10-CM | POA: Diagnosis not present

## 2017-11-10 DIAGNOSIS — Z8249 Family history of ischemic heart disease and other diseases of the circulatory system: Secondary | ICD-10-CM

## 2017-11-10 DIAGNOSIS — R0602 Shortness of breath: Secondary | ICD-10-CM | POA: Diagnosis not present

## 2017-11-10 DIAGNOSIS — Z720 Tobacco use: Secondary | ICD-10-CM | POA: Diagnosis not present

## 2017-11-10 DIAGNOSIS — R0902 Hypoxemia: Secondary | ICD-10-CM | POA: Diagnosis not present

## 2017-11-10 DIAGNOSIS — R69 Illness, unspecified: Secondary | ICD-10-CM | POA: Diagnosis not present

## 2017-11-10 DIAGNOSIS — I10 Essential (primary) hypertension: Secondary | ICD-10-CM | POA: Diagnosis present

## 2017-11-10 HISTORY — DX: Emphysema, unspecified: J43.9

## 2017-11-10 LAB — CBC
HCT: 40.9 % (ref 40.0–52.0)
HEMOGLOBIN: 13.3 g/dL (ref 13.0–18.0)
MCH: 28.2 pg (ref 26.0–34.0)
MCHC: 32.4 g/dL (ref 32.0–36.0)
MCV: 87 fL (ref 80.0–100.0)
Platelets: 144 10*3/uL — ABNORMAL LOW (ref 150–440)
RBC: 4.71 MIL/uL (ref 4.40–5.90)
RDW: 14.1 % (ref 11.5–14.5)
WBC: 5.1 10*3/uL (ref 3.8–10.6)

## 2017-11-10 LAB — COMPREHENSIVE METABOLIC PANEL
ALBUMIN: 3.4 g/dL — AB (ref 3.5–5.0)
ALT: 12 U/L — ABNORMAL LOW (ref 17–63)
ANION GAP: 8 (ref 5–15)
AST: 23 U/L (ref 15–41)
Alkaline Phosphatase: 61 U/L (ref 38–126)
BUN: 18 mg/dL (ref 6–20)
CHLORIDE: 99 mmol/L — AB (ref 101–111)
CO2: 29 mmol/L (ref 22–32)
Calcium: 8.9 mg/dL (ref 8.9–10.3)
Creatinine, Ser: 0.95 mg/dL (ref 0.61–1.24)
GFR calc Af Amer: >60 mL/min (ref >60)
GFR calc non Af Amer: >60 mL/min (ref >60)
GLUCOSE: 127 mg/dL — AB (ref 65–99)
POTASSIUM: 3.6 mmol/L (ref 3.5–5.1)
SODIUM: 136 mmol/L (ref 135–145)
Total Bilirubin: 1.2 mg/dL (ref 0.3–1.2)
Total Protein: 7 g/dL (ref 6.5–8.1)

## 2017-11-10 LAB — TROPONIN I: Troponin I: <0.03 ng/mL (ref <0.03)

## 2017-11-10 MED ORDER — IPRATROPIUM-ALBUTEROL 0.5-2.5 (3) MG/3ML IN SOLN
3.0000 mL | Freq: Once | RESPIRATORY_TRACT | Status: AC
  Administered 2017-11-10: 3 mL via RESPIRATORY_TRACT
  Filled 2017-11-10: qty 3

## 2017-11-10 MED ORDER — SODIUM CHLORIDE 0.9 % IV SOLN
500.0000 mg | INTRAVENOUS | Status: DC
  Filled 2017-11-10: qty 500

## 2017-11-10 MED ORDER — SODIUM CHLORIDE 0.9 % IV SOLN
1.0000 g | Freq: Once | INTRAVENOUS | Status: AC
  Administered 2017-11-10: 1 g via INTRAVENOUS
  Filled 2017-11-10: qty 10

## 2017-11-10 MED ORDER — SODIUM CHLORIDE 0.9 % IV SOLN
500.0000 mg | Freq: Once | INTRAVENOUS | Status: AC
  Administered 2017-11-10: 500 mg via INTRAVENOUS
  Filled 2017-11-10: qty 500

## 2017-11-10 MED ORDER — SODIUM CHLORIDE 0.9 % IV SOLN
1.0000 g | INTRAVENOUS | Status: DC
  Administered 2017-11-11: 17:00:00 1 g via INTRAVENOUS
  Filled 2017-11-10 (×2): qty 10

## 2017-11-10 MED ORDER — ONDANSETRON HCL 4 MG PO TABS
4.0000 mg | ORAL_TABLET | Freq: Four times a day (QID) | ORAL | Status: DC | PRN
Start: 2017-11-10 — End: 2017-11-12

## 2017-11-10 MED ORDER — ACETAMINOPHEN 325 MG PO TABS: 650.0000 mg | ORAL_TABLET | Freq: Four times a day (QID) | ORAL | Status: DC | PRN

## 2017-11-10 MED ORDER — BISACODYL 5 MG PO TBEC: 5.0000 mg | DELAYED_RELEASE_TABLET | Freq: Every day | ORAL | Status: DC | PRN

## 2017-11-10 MED ORDER — SODIUM CHLORIDE 0.9 % IV SOLN
Freq: Once | INTRAVENOUS | Status: AC
  Administered 2017-11-10: 23:00:00 via INTRAVENOUS

## 2017-11-10 MED ORDER — HYDROCODONE-ACETAMINOPHEN 5-325 MG PO TABS: 1.0000 | ORAL_TABLET | ORAL | Status: DC | PRN

## 2017-11-10 MED ORDER — ONDANSETRON HCL 4 MG/2ML IJ SOLN: 4.0000 mg | Freq: Four times a day (QID) | INTRAMUSCULAR | Status: DC | PRN

## 2017-11-10 MED ORDER — METHYLPREDNISOLONE SODIUM SUCC 125 MG IJ SOLR
125.0000 mg | Freq: Once | INTRAMUSCULAR | Status: AC
  Administered 2017-11-10: 125 mg via INTRAVENOUS
  Filled 2017-11-10: qty 2

## 2017-11-10 MED ORDER — ACETAMINOPHEN 650 MG RE SUPP: 650.0000 mg | Freq: Four times a day (QID) | RECTAL | Status: DC | PRN

## 2017-11-10 MED ORDER — DOCUSATE SODIUM 100 MG PO CAPS
100.0000 mg | ORAL_CAPSULE | Freq: Two times a day (BID) | ORAL | Status: DC
  Administered 2017-11-10 – 2017-11-12 (×3): 100 mg via ORAL
  Filled 2017-11-10 (×4): qty 1

## 2017-11-10 MED ORDER — HEPARIN SODIUM (PORCINE) 5000 UNIT/ML IJ SOLN
5000.0000 [IU] | Freq: Three times a day (TID) | INTRAMUSCULAR | Status: DC
  Administered 2017-11-11 – 2017-11-12 (×4): 5000 [IU] via SUBCUTANEOUS
  Filled 2017-11-10 (×4): qty 1

## 2017-11-10 MED ORDER — IPRATROPIUM-ALBUTEROL 0.5-2.5 (3) MG/3ML IN SOLN
3.0000 mL | Freq: Four times a day (QID) | RESPIRATORY_TRACT | Status: DC
  Administered 2017-11-11 – 2017-11-12 (×6): 3 mL via RESPIRATORY_TRACT
  Filled 2017-11-10 (×6): qty 3

## 2017-11-10 MED ORDER — METHYLPREDNISOLONE SODIUM SUCC 125 MG IJ SOLR
80.0000 mg | Freq: Two times a day (BID) | INTRAMUSCULAR | Status: DC
  Administered 2017-11-10: 80 mg via INTRAVENOUS
  Filled 2017-11-10 (×2): qty 2

## 2017-11-10 MED ORDER — TRAZODONE HCL 50 MG PO TABS
25.0000 mg | ORAL_TABLET | Freq: Every evening | ORAL | Status: DC | PRN
  Administered 2017-11-10: 25 mg via ORAL
  Filled 2017-11-10: qty 1

## 2017-11-10 NOTE — H&P (Signed)
[facility] [place] NAME: Carl Bell    MR#:  [id number]  DATE OF BIRTH:  03-17-1951  DATE OF ADMISSION:  11/10/2017  PRIMARY CARE PHYSICIAN: Kirk Ruths, MD   REQUESTING/REFERRING PHYSICIAN:   CHIEF COMPLAINT:   Chief Complaint  Patient presents with  . Shortness of Breath    HISTORY OF PRESENT ILLNESS: Carl Bell  is a 67 y.o. male with a known history of COPD, hypertension and lung cancer in remission, status post chemotherapy and radiation therapy last year.  Patient was brought to emergency room for acute onset of shortness of breath, wheezing and dry cough, going on for the past 2 to 3 days, gradually getting worse.  Patient admits for subjective fevers, but he did not check his temperature at home.  When the paramedics found him, his vision saturation was 88% on room air.  His temperature was 100.1 at the arrival to emergency room.  DuoNeb treatment helped with the respiratory distress.  Blood test done in the emergency room showed normal WBC, troponin level and creatinine level.  Chest x-ray reviewed by myself, is suggestive of LT lung interstitial pneumonitis. Patient is admitted for further evaluation and treatment.   PAST MEDICAL HISTORY:   Past Medical History:  Diagnosis Date  . COPD (chronic obstructive pulmonary disease) ([place])   . Emphysema of lung ([place])   . Glaucoma   . Hypertension   . Lung cancer ([place])     PAST SURGICAL HISTORY:  Past Surgical History:  Procedure Laterality Date  . LUNG BIOPSY    . PERIPHERAL VASCULAR CATHETERIZATION N/A 09/24/2015   Procedure: [name];  Surgeon: Algernon Huxley, MD;  Location: [facility];  Service: Cardiovascular;  Laterality: N/A;  . Port a cath placement      SOCIAL HISTORY:  Social History   Tobacco Use  . Smoking status: Current Every Day Smoker    Packs/day: 0.50    Years: 50.00    Pack years: 25.00    Types: Cigarettes  .  Smokeless tobacco: Never Used  Substance Use Topics  . Alcohol use: Yes    Alcohol/week: 0.0 oz    Comment: 3 pints/week    FAMILY HISTORY:  Family History  Problem Relation Age of Onset  . Diabetes Mellitus II Mother   . Hypertension Father     DRUG ALLERGIES: No Known Allergies  REVIEW OF SYSTEMS:   CONSTITUTIONAL: Positive for fever, fatigue and generalized weakness.  EYES: No vision changes.  EARS, NOSE, AND THROAT: No tinnitus or ear pain.  RESPIRATORY: Positive for cough, shortness of breath, wheezing; no hemoptysis.  CARDIOVASCULAR: No chest pain, orthopnea, edema.  GASTROINTESTINAL: No nausea, vomiting, diarrhea or abdominal pain.  GENITOURINARY: No dysuria, hematuria.  ENDOCRINE: No polyuria, nocturia,  HEMATOLOGY: No anemia, easy bruising or bleeding SKIN: No rash or lesion. MUSCULOSKELETAL: No joint pain or arthritis.   NEUROLOGIC: No focal weakness.  PSYCHIATRY: No anxiety or depression.   MEDICATIONS AT HOME:  Prior to Admission medications   Medication Sig Start Date End Date Taking? Authorizing Provider  albuterol (PROVENTIL HFA;VENTOLIN HFA) 108 (90 Base) MCG/ACT inhaler Inhale 2 puffs into the lungs every 6 (six) hours as needed for wheezing or shortness of breath. Patient not taking: Reported on 11/10/2017 08/14/17   Verlon Au, NP  lidocaine-prilocaine (EMLA) cream Apply 1 application topically as needed. Apply to port 1 hour prior to chemotherapy appointment. Cover with plastic wrap. 10/04/15  Lloyd Huger, MD      PHYSICAL EXAMINATION:   VITAL SIGNS: Blood pressure (!) 142/97, pulse (!) 114, temperature 100.1 F (37.8 C), temperature source Oral, resp. rate 16, height 6\' 1"  (1.854 m), weight 72.6 kg (160 lb), SpO2 94 %.  GENERAL:  67 y.o.-year-old patient lying in the bed with mild respiratory distress.  EYES: Pupils equal, round, reactive to light and accommodation. No scleral icterus.  HEENT: Head atraumatic, normocephalic. Oropharynx  and nasopharynx clear.  NECK:  Supple, no jugular venous distention. No thyroid enlargement, no tenderness.  LUNGS: Normal breath sounds bilaterally, no wheezing, rales,rhonchi or crepitation. No use of accessory muscles of respiration.  CARDIOVASCULAR: S1, S2 normal. No murmurs, rubs, or gallops.  ABDOMEN: Soft, nontender, nondistended. Bowel sounds present. No organomegaly or mass.  EXTREMITIES: No pedal edema, cyanosis, or clubbing.  NEUROLOGIC: No focal weakness. PSYCHIATRIC: The patient is alert and oriented x 3.  SKIN: No obvious rash, lesion, or ulcer.   LABORATORY PANEL:   CBC Recent Labs  Lab 11/10/17 1909  WBC 5.1  HGB 13.3  HCT 40.9  PLT 144*  MCV 87.0  MCH 28.2  MCHC 32.4  RDW 14.1   ------------------------------------------------------------------------------------------------------------------  Chemistries  Recent Labs  Lab 11/10/17 1909  NA 136  K 3.6  CL 99*  CO2 29  GLUCOSE 127*  BUN 18  CREATININE 0.95  CALCIUM 8.9  AST 23  ALT 12*  ALKPHOS 61  BILITOT 1.2   ------------------------------------------------------------------------------------------------------------------ estimated creatinine clearance is 76.4 mL/min (by C-G formula based on SCr of 0.95 mg/dL). ------------------------------------------------------------------------------------------------------------------ No results for input(s): TSH, T4TOTAL, T3FREE, THYROIDAB in the last 72 hours.  Invalid input(s): FREET3   Coagulation profile No results for input(s): INR, PROTIME in the last 168 hours. ------------------------------------------------------------------------------------------------------------------- No results for input(s): DDIMER in the last 72 hours. -------------------------------------------------------------------------------------------------------------------  Cardiac Enzymes Recent Labs  Lab 11/10/17 1909  TROPONINI <0.03    ------------------------------------------------------------------------------------------------------------------ Invalid input(s): POCBNP  ---------------------------------------------------------------------------------------------------------------  Urinalysis No results found for: COLORURINE, APPEARANCEUR, LABSPEC, PHURINE, GLUCOSEU, HGBUR, BILIRUBINUR, KETONESUR, PROTEINUR, UROBILINOGEN, NITRITE, LEUKOCYTESUR   RADIOLOGY: Dg Chest 2 View  Result Date: 11/10/2017 CLINICAL DATA:  Shortness of breath for the past 3 days. History of lung cancer and COPD. Smoker. EXAM: CHEST - 2 VIEW COMPARISON:  08/08/2017 and chest CT dated 10/05/2017. FINDINGS: Normal sized heart. Stable right jugular porta catheter. Interval minimal focal interstitial prominence in the left mid lung zone. The lungs remain hyperexpanded with central peribronchial thickening. Stable linear densities in the lingula and left lower lobe. Mild thoracic spine degenerative changes. IMPRESSION: 1. Interval minimal focal interstitial prominence in the left mid lung zone. This most likely represents interstitial pneumonitis, possibly viral in origin. Followup PA and lateral chest X-ray is recommended in 3-4 weeks following trial of appropriate therapy to ensure resolution and exclude underlying malignancy. 2. Stable changes of COPD and chronic bronchitis. Electronically Signed   By: Claudie Revering M.D.   On: 11/10/2017 20:02    EKG: Orders placed or performed during the hospital encounter of 08/08/17  . ED EKG  . ED EKG  . EKG 12-Lead  . EKG 12-Lead  . EKG    IMPRESSION AND PLAN:  1.  Acute respiratory failure with hypoxia, secondary to interstitial pneumonitis.  We will start IV antibiotics, ceftriaxone and azithromycin, oxygen therapy, DuoNeb and steroids IV.  Continue to monitor patient clinically closely.  He will need to repeat the chest x-ray in 2 to 3 weeks to ensure resolution of pneumonitis. 2.  Acute COPD  exacerbation, likely secondary to interstitial pneumonitis.  See treatment as above under #1. 3.  Left mid lung interstitial pneumonitis.  See treatment as above under #1. 4.  Lung CA, in remission, status post chemo and radiation therapy.  Patient is to continue to follow-up with Dr. Grayland Ormond of Oncology every 6 months. 5.  Hypertension, stable, restart home medications. 6.  Tobacco abuse.  Smoking cessation was discussed with patient in detail.   All the records are reviewed and case discussed with ED provider. Management plans discussed with the patient, family and they are in agreement.  CODE STATUS: Code Status History    Date Active Date Inactive Code Status Order ID Comments User Context   12/22/2015 1356 12/23/2015 1350 Full Code [id number]  Idelle Crouch, MD Inpatient       TOTAL TIME TAKING CARE OF THIS PATIENT: 45 minutes.    Amelia Jo M.D on 11/10/2017 at 10:23 PM  Between 7am to 6pm - Pager - [phone]  After 6pm go to [email or web address] - password [email or web address] [facility]  [facility]  Office  [phone]  CC: Primary care physician; Kirk Ruths, MD

## 2017-11-10 NOTE — ED Triage Notes (Signed)
Pt arrives via [facility] from home with complaints of worsening shortness of breath x3 days. Son called EMS because pt could not catch a breath and was much more dyspneic upon exertion than normal. Pt has inhaler and took it prior to EMS arrival with no relief. Initial O2 sat 88% upon medic arrival. Given 1 duoneb en route on 8L. Non-productive cough present. Pt denies pain at this time.   Pt has hx COPD, chronic bronchitis, emphysema and lung cancer. Last cancer treatment was summer 2018.

## 2017-11-10 NOTE — ED Provider Notes (Addendum)
[facility] Emergency Department Provider Note  Time seen: 7:11 PM  I have reviewed the triage vital signs and the nursing notes.   HISTORY  Chief Complaint Shortness of Breath    HPI Carl Bell is a 67 y.o. male with a past medical history of COPD, emphysema, hypertension, lung cancer status post radiation and chemotherapy not currently on treatment who presents to the emergency department for shortness of breath.  According to the patient for the past 3 days he has had progressively worsening shortness of breath, states occasional cough but denies sputum production.  Denies known fever, 100.1 in the emergency department.  EMS states patient had a room air saturation of 88% on their arrival, does not wear home oxygen.  After 1 DuoNeb this increased to 93% on room air upon arrival.  Patient denies any chest pain besides mild discomfort when coughing.  Denies abdominal pain does state vomiting 2 days ago but none today.  Denies diarrhea.  Largely negative review of systems otherwise.   Past Medical History:  Diagnosis Date  . COPD (chronic obstructive pulmonary disease) ([place])   . Emphysema of lung ([place])   . Glaucoma   . Hypertension   . Lung cancer [facility])     Patient Active Problem List   Diagnosis Date Noted  . Acute respiratory distress 12/22/2015  . COPD exacerbation ([place]) 12/22/2015  . Acute bronchitis 12/22/2015  . HTN, goal below 140/80 12/22/2015  . Lung cancer, lingula ([place]) 09/23/2015    Past Surgical History:  Procedure Laterality Date  . LUNG BIOPSY    . PERIPHERAL VASCULAR CATHETERIZATION N/A 09/24/2015   Procedure: [name];  Surgeon: Algernon Huxley, MD;  Location: [place];  Service: Cardiovascular;  Laterality: N/A;  . Port a cath placement      Prior to Admission medications   Medication Sig Start Date End Date Taking? Authorizing Provider  albuterol (PROVENTIL HFA;VENTOLIN HFA) 108 (90 Base) MCG/ACT inhaler  Inhale 2 puffs into the lungs every 6 (six) hours as needed for wheezing or shortness of breath. 08/14/17   Verlon Au, NP  lidocaine-prilocaine (EMLA) cream Apply 1 application topically as needed. Apply to port 1 hour prior to chemotherapy appointment. Cover with plastic wrap. 10/04/15   Lloyd Huger, MD  travoprost, benzalkonium, (TRAVATAN) 0.004 % ophthalmic solution Place 1 drop into both eyes at bedtime.    [provider]    No Known Allergies  Family History  Problem Relation Age of Onset  . Diabetes Mellitus II Mother   . Hypertension Father     Social History Social History   Tobacco Use  . Smoking status: Current Every Day Smoker    Packs/day: 0.50    Years: 50.00    Pack years: 25.00    Types: Cigarettes  . Smokeless tobacco: Never Used  Substance Use Topics  . Alcohol use: Yes    Alcohol/week: 0.0 oz    Comment: 3 pints/week  . Drug use: No    Review of Systems Constitutional: Negative for fever. Eyes: Negative for visual complaints ENT: Negative for recent illness/congestion Cardiovascular: Mild chest discomfort when coughing only.  None currently. Respiratory: Significant shortness of breath this morning, mild currently. Gastrointestinal: Negative for abdominal pain.  One episode of vomiting yesterday.  None today. Genitourinary: Negative for urinary compaints Musculoskeletal: Negative for leg pain or swelling Skin: Negative for skin complaints  Neurological: Negative for headache All other ROS negative  ____________________________________________  PHYSICAL EXAM:  VITAL SIGNS: ED Triage Vitals  Enc Vitals Group     BP 11/10/17 1904 (!) 170/100     Pulse Rate 11/10/17 1904 (!) 108     Resp 11/10/17 1904 (!) 36     Temp 11/10/17 1904 100.1 F (37.8 C)     Temp Source 11/10/17 1904 Oral     SpO2 11/10/17 1859 (!) 88 %     Weight 11/10/17 1906 160 lb (72.6 kg)     Height 11/10/17 1906 6\' 1"  (1.854 m)     Head Circumference --       Peak Flow --      Pain Score 11/10/17 1906 0     Pain Loc --      Pain Edu? --      Excl. in [place]? --    Constitutional: Alert and oriented. Well appearing and in no distress. Eyes: Normal exam ENT   Head: Normocephalic and atraumatic.   Mouth/Throat: Mucous membranes are moist. Cardiovascular: Normal rate, regular rhythm around 100 bpm. Respiratory: Patient has fairly diminished breath sounds bilaterally with mild to moderate expiratory wheeze bilaterally.  No obvious rales or rhonchi. Gastrointestinal: Soft and nontender. No distention.   Musculoskeletal: Nontender with normal range of motion in all extremities. No lower extremity tenderness or edema. Neurologic:  Normal speech and language. No gross focal neurologic deficits Skin:  Skin is warm, dry and intact.  Psychiatric: Mood and affect are normal.   ____________________________________________    EKG  EKG reviewed and interpreted by myself shows sinus tachycardia 110 bpm with a narrow QRS, normal axis, normal intervals, no concerning ST changes.  ____________________________________________    RADIOLOGY  X-ray shows left-sided pneumonitis/pneumonia  ____________________________________________   INITIAL IMPRESSION / ASSESSMENT AND PLAN / ED COURSE  Pertinent labs & imaging results that were available during my care of the patient were reviewed by me and considered in my medical decision making (see chart for details).  Vision presents emergency department for 3 days of progressive worsening shortness of breath.  Differential would include COPD exacerbation, pneumonia, pneumothorax, ACS.  We will check labs, chest x-ray and continue to closely monitor.  Overall the patient appears well.  States he is feeling much better now than he was for EMS arrival.  We will dose 2 additional DuoNeb's, 125 mg of Solu-Medrol, while awaiting labs and chest x-ray.  X-ray shows likely pneumonitis/pneumonia in the left  midlung.  Patient has a low-grade temperature 100.1 in the emergency department.  We will send blood cultures and cover with antibiotics.  Patient is hypoxic on room air 88%.  At a minimum is experiencing a COPD exacerbation and likely experiencing pneumonia as well.  Patient will be admitted to the hospital service for further treatment and work-up.  CRITICAL CARE Performed by: Harvest Dark   Total critical care time: 30 minutes  Critical care time was exclusive of separately billable procedures and treating other patients.  Critical care was necessary to treat or prevent imminent or life-threatening deterioration.  Critical care was time spent personally by me on the following activities: development of treatment plan with patient and/or surrogate as well as nursing, discussions with consultants, evaluation of patient's response to treatment, examination of patient, obtaining history from patient or surrogate, ordering and performing treatments and interventions, ordering and review of laboratory studies, ordering and review of radiographic studies, pulse oximetry and re-evaluation of patient's condition.   ____________________________________________   FINAL CLINICAL IMPRESSION(S) / ED DIAGNOSES  Dyspnea COPD  exacerbation Pneumonia Hypoxia   Harvest Dark, MD 11/10/17 2043    Harvest Dark, MD 11/24/17 [phone]

## 2017-11-11 LAB — URINALYSIS, COMPLETE (UACMP) WITH MICROSCOPIC
BACTERIA UA: NONE SEEN
BILIRUBIN URINE: NEGATIVE
Glucose, UA: NEGATIVE mg/dL
HGB URINE DIPSTICK: NEGATIVE
KETONES UR: 5 mg/dL — AB
LEUKOCYTES UA: NEGATIVE
NITRITE: NEGATIVE
PROTEIN: NEGATIVE mg/dL
Specific Gravity, Urine: 1.025 (ref 1.005–1.030)
pH: 5 (ref 5.0–8.0)

## 2017-11-11 LAB — GLUCOSE, CAPILLARY: Glucose-Capillary: 158 mg/dL — ABNORMAL HIGH (ref 65–99)

## 2017-11-11 LAB — CBC
HEMATOCRIT: 42.4 % (ref 40.0–52.0)
Hemoglobin: 13.5 g/dL (ref 13.0–18.0)
MCH: 27.9 pg (ref 26.0–34.0)
MCHC: 31.9 g/dL — ABNORMAL LOW (ref 32.0–36.0)
MCV: 87.6 fL (ref 80.0–100.0)
Platelets: 169 10*3/uL (ref 150–440)
RBC: 4.85 MIL/uL (ref 4.40–5.90)
RDW: 14.6 % — AB (ref 11.5–14.5)
WBC: 3.3 10*3/uL — AB (ref 3.8–10.6)

## 2017-11-11 LAB — BASIC METABOLIC PANEL
Anion gap: 8 (ref 5–15)
BUN: 25 mg/dL — AB (ref 6–20)
CHLORIDE: 100 mmol/L — AB (ref 101–111)
CO2: 28 mmol/L (ref 22–32)
Calcium: 8.4 mg/dL — ABNORMAL LOW (ref 8.9–10.3)
Creatinine, Ser: 0.94 mg/dL (ref 0.61–1.24)
GFR calc Af Amer: >60 mL/min (ref >60)
GFR calc non Af Amer: >60 mL/min (ref >60)
Glucose, Bld: 220 mg/dL — ABNORMAL HIGH (ref 65–99)
POTASSIUM: 4.2 mmol/L (ref 3.5–5.1)
SODIUM: 136 mmol/L (ref 135–145)

## 2017-11-11 MED ORDER — GUAIFENESIN ER 600 MG PO TB12
600.0000 mg | ORAL_TABLET | Freq: Two times a day (BID) | ORAL | Status: DC
  Administered 2017-11-11 – 2017-11-12 (×3): 600 mg via ORAL
  Filled 2017-11-11 (×3): qty 1

## 2017-11-11 MED ORDER — FOLIC ACID 1 MG PO TABS
1.0000 mg | ORAL_TABLET | Freq: Every day | ORAL | Status: DC
  Administered 2017-11-11 – 2017-11-12 (×2): 1 mg via ORAL
  Filled 2017-11-11 (×2): qty 1

## 2017-11-11 MED ORDER — PREDNISONE 50 MG PO TABS: 60.0000 mg | ORAL_TABLET | Freq: Every day | ORAL | Status: DC

## 2017-11-11 MED ORDER — PREDNISONE 20 MG PO TABS
60.0000 mg | ORAL_TABLET | Freq: Every day | ORAL | Status: DC
  Administered 2017-11-12: 09:00:00 60 mg via ORAL
  Filled 2017-11-11: qty 3
  Filled 2017-11-11: qty 1

## 2017-11-11 MED ORDER — VITAMIN B-1 100 MG PO TABS
100.0000 mg | ORAL_TABLET | Freq: Every day | ORAL | Status: DC
  Administered 2017-11-11 – 2017-11-12 (×2): 100 mg via ORAL
  Filled 2017-11-11 (×2): qty 1

## 2017-11-11 MED ORDER — ENSURE ENLIVE PO LIQD
237.0000 mL | Freq: Three times a day (TID) | ORAL | Status: DC
  Administered 2017-11-11 – 2017-11-12 (×4): 237 mL via ORAL

## 2017-11-11 MED ORDER — AZITHROMYCIN 500 MG PO TABS
500.0000 mg | ORAL_TABLET | Freq: Every day | ORAL | Status: DC
  Administered 2017-11-11: 500 mg via ORAL
  Filled 2017-11-11: qty 1

## 2017-11-11 MED ORDER — ADULT MULTIVITAMIN W/MINERALS CH
1.0000 | ORAL_TABLET | Freq: Every day | ORAL | Status: DC
  Administered 2017-11-11 – 2017-11-12 (×2): 1 via ORAL
  Filled 2017-11-11 (×2): qty 1

## 2017-11-11 MED ORDER — GUAIFENESIN-DM 100-10 MG/5ML PO SYRP
5.0000 mL | ORAL_SOLUTION | ORAL | Status: DC | PRN
  Administered 2017-11-11 – 2017-11-12 (×3): 5 mL via ORAL
  Filled 2017-11-11 (×6): qty 5

## 2017-11-11 NOTE — Progress Notes (Signed)
Initial Nutrition Assessment  DOCUMENTATION CODES:   Severe malnutrition in context of chronic illness  INTERVENTION:  Recommend liberalizing to a regular diet in setting of severe malnutrition.  Provide Ensure Enlive po TID, each supplement provides 350 kcal and 20 grams of protein. Patient prefers vanilla.  Provide MVI daily, thiamine 938 mg daily, folic acid 1 mg daily.  Monitor magnesium, potassium, and phosphorus daily for at least 3 days, MD to replete as needed, as pt is at risk for refeeding syndrome given severe malnutrition, EtOH abuse.  Encouraged adequate intake of calories and protein from meals, snacks, and oral nutrition supplements.  NUTRITION DIAGNOSIS:   Severe Malnutrition related to chronic illness(COPD, hx stage III SCC of lung s/p chemotherapy and XRT, EtOH abuse) as evidenced by severe fat depletion, severe muscle depletion.  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   Met with patient at bedside. He reports he has had a poor appetite for a while now, but it has been even worse than usual the past 2 weeks. He reports that he finished his treatment for lung cancer approximately one year ago. During treatment he had a very poor appetite and intake from treatment side effects. He reports that now a barrier to intake is drinking alcohol. He reports drinking 1/2 bottle of gin most days of the week. Those days where he drinks he may only eat one small meal that day. He reports his mother, who is around 34 years old still cooks for him. She typically makes a meat with  beans and vegetables. He wants to cut back on his drinking so he can eat better and gain his weight back.  UBW was 175 lbs prior to being diagnosed with cancer. Recently patient was 153.4 lbs on 10/07/2017 and has lost 12 lbs (7.8% body weight) over the past month, which is significant for time frame.  Meal Completion: 0% of breakfast this morning  Medications reviewed and include: azithromycin, Colace, prednisone 60 mg daily, ceftriaxone.  Labs reviewed: CBG 158, Chloride 100, BUN 25.  Discussed with RN.  NUTRITION - FOCUSED PHYSICAL EXAM:    Most Recent Value  Orbital Region  Severe depletion  Upper Arm Region  Severe depletion  Thoracic and Lumbar Region  Severe depletion  Buccal Region  Severe depletion  Temple Region  Severe depletion  Clavicle Bone Region  Severe depletion  Clavicle and Acromion Bone Region  Severe depletion  Scapular Bone Region  Severe depletion  Dorsal Hand  Severe depletion  Patellar Region  Severe depletion  Anterior Thigh Region  Severe depletion  Posterior Calf Region  Severe depletion  Edema (RD Assessment)  None  Hair  Reviewed  Eyes  Reviewed  Mouth  Reviewed  Skin  Reviewed  Nails  Reviewed     Diet Order:   Diet Order           Diet regular Room service appropriate? Yes with Assist; Fluid consistency: Thin  Diet effective now          EDUCATION NEEDS:   Education needs have been addressed  Skin:  Skin Assessment: Reviewed RN Assessment(abrasions to legs and back)  Last BM:  PTA (11/08/2017 per chart)  Height:   Ht Readings from Last 1 Encounters:  11/10/17 6' 1"  (1.854 m)    Weight:   Wt Readings from Last 1 Encounters:  11/11/17 141 lb 6.4 oz (64.1 kg)    Ideal Body Weight:  83.6 kg  BMI:  Body mass index is 18.66 kg/m.  Estimated Nutritional Needs:   Kcal:  0601-5615 (MSJ x 1.3-1.5)  Protein:  95-115 grams (1.5-1.8 grams/kg)  Fluid:  1.9-2.2 L/day (30-35 mL/kg)  Willey Blade, MS, RD, LDN Office:  [phone] Pager: [phone] After Hours/Weekend Pager: [phone]

## 2017-11-11 NOTE — Care Management (Signed)
Current need for supplemental oxygen is acute. Will require home 02 assessment prior to discharge.  Current with pcp. Denies issues obtaining meds. Has coverage for medications.  Uses an inhaler at home.

## 2017-11-11 NOTE — Plan of Care (Signed)
  Problem: Education: Goal: Knowledge of General Education information will improve Outcome: Progressing   Problem: Nutrition: Goal: Adequate nutrition will be maintained Outcome: Progressing   Problem: Safety: Goal: Ability to remain free from injury will improve Outcome: Progressing   Problem: Education: Goal: Knowledge of the prescribed therapeutic regimen will improve Outcome: Progressing   Problem: Respiratory: Goal: Ability to maintain a clear airway will improve Outcome: Progressing Goal: Levels of oxygenation will improve Outcome: Progressing Goal: Ability to maintain adequate ventilation will improve Outcome: Progressing

## 2017-11-11 NOTE — Progress Notes (Signed)
MEDICATION RELATED CONSULT NOTE - INITIAL   Pharmacy Consult for electrolyte management Indication: high risk for refeeding due to alcohol abuse/malnutrition  No Known Allergies  Patient Measurements: Height: 6\' 1"  (185.4 cm) Weight: 141 lb 6.4 oz (64.1 kg) IBW/kg (Calculated) : 79.9 Adjusted Body Weight:   Vital Signs: Temp: 97.6 F (36.4 C) (05/01 1215) Temp Source: Oral (05/01 1215) BP: 111/76 (05/01 1215) Pulse Rate: 89 (05/01 1215) Intake/Output from previous day: 04/30 0701 - 05/01 0700 In: 350 [IV Piggyback:350] Out: -  Intake/Output from this shift: Total I/O In: 360 [P.O.:360] Out: -   Labs: Recent Labs    11/10/17 1909 11/11/17 0933  WBC 5.1 3.3*  HGB 13.3 13.5  HCT 40.9 42.4  PLT 144* 169  CREATININE 0.95 0.94  ALBUMIN 3.4*  --   PROT 7.0  --   AST 23  --   ALT 12*  --   ALKPHOS 61  --   BILITOT 1.2  --    Estimated Creatinine Clearance: 68.2 mL/min (by C-G formula based on SCr of 0.94 mg/dL).   Microbiology: Recent Results (from the past 720 hour(s))  Blood culture (routine x 2)     Status: None (Preliminary result)   Collection Time: 11/10/17  9:05 PM  Result Value Ref Range Status   Specimen Description BLOOD LEFT HAND  Final   Special Requests   Final    BOTTLES DRAWN AEROBIC AND ANAEROBIC Blood Culture adequate volume   Culture   Final    NO GROWTH < 12 HOURS Performed at [facility], [address], [place], [place] [address]    Report Status PENDING  Incomplete  Blood culture (routine x 2)     Status: None (Preliminary result)   Collection Time: 11/10/17  9:10 PM  Result Value Ref Range Status   Specimen Description BLOOD RIGHT AC  Final   Special Requests   Final    BOTTLES DRAWN AEROBIC AND ANAEROBIC Blood Culture adequate volume   Culture   Final    NO GROWTH < 12 HOURS Performed at [facility], [address], [place], [place] [address]    Report Status PENDING  Incomplete    Medical History: Past  Medical History:  Diagnosis Date  . COPD (chronic obstructive pulmonary disease) ([place])   . Emphysema of lung ([place])   . Glaucoma   . Hypertension   . Lung cancer ([place])     Medications:  Infusions:  . cefTRIAXone (ROCEPHIN)  IV      Assessment: 80 yom cc SOB with PMH COPD, HTN, lung cancer in remission s/p chemo and radiation. Patient is being treated with IV antibiotics for interstitial pneumonitis/acute respiratory failure as well as AECOPD. Dietician consult notes severe malnutrition in context of chronic illness given risk for refeeding and alcohol abuse. Pharmacy consulted to manage electrolytes due to risk of refeeding.  Goal of Therapy:  K 3.5 to 5 Ca 8.9 to 10.3 Mg 1.7 to 2.4 Phos 2.5 to 4.6  Plan:  BMP this AM showed K WNL. Will recheck BMP along with magnesium and phosphorus tomorrow with AM labs.  Laural Benes, Pharm.D., BCPS Clinical Pharmacist 11/11/2017,2:20 PM

## 2017-11-11 NOTE — Progress Notes (Signed)
Pt weaned to room air. VS stable. O2 sat. 96-98% RA. No distress noted. Will continue to monitor.  Kyla Balzarine, LPN

## 2017-11-12 DIAGNOSIS — E43 Unspecified severe protein-calorie malnutrition: Secondary | ICD-10-CM

## 2017-11-12 LAB — BASIC METABOLIC PANEL
ANION GAP: 9 (ref 5–15)
BUN: 26 mg/dL — ABNORMAL HIGH (ref 6–20)
CHLORIDE: 103 mmol/L (ref 101–111)
CO2: 26 mmol/L (ref 22–32)
Calcium: 8.6 mg/dL — ABNORMAL LOW (ref 8.9–10.3)
Creatinine, Ser: 0.89 mg/dL (ref 0.61–1.24)
GFR calc non Af Amer: >60 mL/min (ref >60)
Glucose, Bld: 135 mg/dL — ABNORMAL HIGH (ref 65–99)
POTASSIUM: 3.5 mmol/L (ref 3.5–5.1)
SODIUM: 138 mmol/L (ref 135–145)

## 2017-11-12 LAB — MAGNESIUM: MAGNESIUM: 1.8 mg/dL (ref 1.7–2.4)

## 2017-11-12 LAB — GLUCOSE, CAPILLARY: Glucose-Capillary: 106 mg/dL — ABNORMAL HIGH (ref 65–99)

## 2017-11-12 LAB — PHOSPHORUS: PHOSPHORUS: 2.3 mg/dL — AB (ref 2.5–4.6)

## 2017-11-12 MED ORDER — ALBUTEROL SULFATE HFA 108 (90 BASE) MCG/ACT IN AERS
2.0000 | INHALATION_SPRAY | Freq: Four times a day (QID) | RESPIRATORY_TRACT | 3 refills | Status: DC | PRN
Start: 2017-11-12 — End: 2019-09-12

## 2017-11-12 MED ORDER — AZITHROMYCIN 250 MG PO TABS: 250.0000 mg | ORAL_TABLET | Freq: Every day | ORAL | 0 refills | Status: AC

## 2017-11-12 MED ORDER — ENSURE ENLIVE PO LIQD: 237.0000 mL | Freq: Three times a day (TID) | ORAL | 12 refills | Status: DC

## 2017-11-12 MED ORDER — GUAIFENESIN-DM 100-10 MG/5ML PO SYRP: 5.0000 mL | ORAL_SOLUTION | ORAL | 0 refills | Status: DC | PRN

## 2017-11-12 MED ORDER — ADULT MULTIVITAMIN W/MINERALS CH: 1.0000 | ORAL_TABLET | Freq: Every day | ORAL | 0 refills | Status: DC

## 2017-11-12 MED ORDER — MAGNESIUM SULFATE IN D5W 1-5 GM/100ML-% IV SOLN
1.0000 g | Freq: Once | INTRAVENOUS | Status: AC
  Administered 2017-11-12: 13:00:00 1 g via INTRAVENOUS
  Filled 2017-11-12: qty 100

## 2017-11-12 MED ORDER — CEFUROXIME AXETIL 250 MG PO TABS: 250.0000 mg | ORAL_TABLET | Freq: Two times a day (BID) | ORAL | 0 refills | Status: AC

## 2017-11-12 MED ORDER — THIAMINE HCL 100 MG PO TABS: 100.0000 mg | ORAL_TABLET | Freq: Every day | ORAL | 0 refills | Status: DC

## 2017-11-12 MED ORDER — K PHOS MONO-SOD PHOS DI & MONO 155-852-130 MG PO TABS
500.0000 mg | ORAL_TABLET | Freq: Two times a day (BID) | ORAL | Status: DC
  Administered 2017-11-12: 12:00:00 500 mg via ORAL
  Filled 2017-11-12 (×3): qty 2

## 2017-11-12 MED ORDER — PREDNISONE 10 MG (21) PO TBPK: ORAL_TABLET | ORAL | 0 refills | Status: DC

## 2017-11-12 NOTE — Progress Notes (Signed)
MEDICATION RELATED CONSULT NOTE - INITIAL   Pharmacy Consult for electrolyte management Indication: high risk for refeeding due to alcohol abuse/malnutrition    Labs: Recent Labs    11/10/17 1909 11/11/17 0933 11/12/17 1004  WBC 5.1 3.3*  --   HGB 13.3 13.5  --   HCT 40.9 42.4  --   PLT 144* 169  --   CREATININE 0.95 0.94 0.89  MG  --   --  1.8  PHOS  --   --  2.3*  ALBUMIN 3.4*  --   --   PROT 7.0  --   --   AST 23  --   --   ALT 12*  --   --   ALKPHOS 61  --   --   BILITOT 1.2  --   --    Potassium (mmol/L)  Date Value  11/12/2017 3.5  06/22/2014 3.7   Magnesium (mg/dL)  Date Value  11/12/2017 1.8   Calcium (mg/dL)  Date Value  11/12/2017 8.6 (L)   Calcium, Total (mg/dL)  Date Value  06/22/2014 8.4 (L)   Albumin (g/dL)  Date Value  11/10/2017 3.4 (L)  06/22/2014 3.2 (L)   Phosphorus (mg/dL)  Date Value  11/12/2017 2.3 (L)  ] Estimated Creatinine Clearance: 72.9 mL/min (by C-G formula based on SCr of 0.89 mg/dL).   Microbiology:  Medical History: Past Medical History:  Diagnosis Date  . COPD (chronic obstructive pulmonary disease) ([place])   . Emphysema of lung ([place])   . Glaucoma   . Hypertension   . Lung cancer ([place])     Assessment: 86 yom cc SOB with PMH COPD, HTN, lung cancer in remission s/p chemo and radiation. Patient is being treated with IV antibiotics for interstitial pneumonitis/acute respiratory failure as well as AECOPD. Dietician consult notes severe malnutrition in context of chronic illness given risk for refeeding and alcohol abuse. Pharmacy consulted to manage electrolytes due to risk of refeeding.  Goal of Therapy:  K 3.5 to 5 Ca 8.9 to 10.3 Mg 1.7 to 2.4 Phos 2.5 to 4.6  Plan:  5/2 AM: K: 3.5, Mg: 1.8, Phos: 2.3 Will replace with Magnesium 1g IV x 1 and Kphos 500mg  twice daily x 3 doses.  Will F/u with AM labs and continue to replace as needed.   Alex Leahy M Hennesy Sobalvarro, Pharm.D., BCPS Clinical Pharmacist 11/12/2017,10:47  AM

## 2017-11-12 NOTE — Discharge Summary (Signed)
[facility] [place] NAME: Carl Bell    MR#:  [id number]  DATE OF BIRTH:  [birth year]  DATE OF ADMISSION:  11/10/2017 ADMITTING PHYSICIAN: Amelia Jo, MD  DATE OF DISCHARGE: 11/12/2017   PRIMARY CARE PHYSICIAN: Kirk Ruths, MD    ADMISSION DIAGNOSIS:  COPD exacerbation ([place]) [J44.1] Community acquired pneumonia of left lung, unspecified part of lung [J18.9]  DISCHARGE DIAGNOSIS:  Active Problems:   Acute respiratory failure ([place])   Protein-calorie malnutrition, severe   SECONDARY DIAGNOSIS:   Past Medical History:  Diagnosis Date  . COPD (chronic obstructive pulmonary disease) ([place])   . Emphysema of lung ([place])   . Glaucoma   . Hypertension   . Lung cancer [facility])     HOSPITAL COURSE:   1.Acute respiratory failure with hypoxia,secondary to interstitial pneumonitis.  IV antibiotics, ceftriaxone and azithromycin,oxygen therapy,DuoNeb and steroids IV. Continue to monitor patient clinically closely.He will need to repeat the chest x-ray in 2 to 3 weeks to ensure resolution of pneumonitis. 2.Acute COPD exacerbation,likely secondary to interstitial pneumonitis.  Still need supplemental oxygen, encouraged to taper and ambulate today. 3.Left mid lung interstitial pneumonitis.See treatment as above under #1. 4.Lung CA,in remission,status post chemoandradiation therapy.Patient is to continue to follow-up with Dr. Grayland Ormond of Oncology every 6 months. 5.Hypertension, stable, restart home medications. 6.Tobacco abuse.Smoking cessation was discussed with patient in detail for 4 min.    DISCHARGE CONDITIONS:   Stable.  CONSULTS OBTAINED:    DRUG ALLERGIES:  No Known Allergies  DISCHARGE MEDICATIONS:   Allergies as of 11/12/2017   No Known Allergies     Medication List    TAKE these medications   albuterol 108 (90 Base) MCG/ACT inhaler Commonly known as:  PROVENTIL  HFA;VENTOLIN HFA Inhale 2 puffs into the lungs every 6 (six) hours as needed for wheezing or shortness of breath.   azithromycin 250 MG tablet Commonly known as:  ZITHROMAX Z-PAK Take 1 tablet (250 mg total) by mouth daily for 3 days. Take 2 tablets (500 mg) on  Day 1,  followed by 1 tablet (250 mg) once daily on Days 2 through 5.   cefUROXime 250 MG tablet Commonly known as:  CEFTIN Take 1 tablet (250 mg total) by mouth 2 (two) times daily for 3 days.   feeding supplement (ENSURE ENLIVE) Liqd Take 237 mLs by mouth 3 (three) times daily between meals.   guaiFENesin-dextromethorphan 100-10 MG/5ML syrup Commonly known as:  ROBITUSSIN DM Take 5 mLs by mouth every 4 (four) hours as needed for cough.   lidocaine-prilocaine cream Commonly known as:  EMLA Apply 1 application topically as needed. Apply to port 1 hour prior to chemotherapy appointment. Cover with plastic wrap.   multivitamin with minerals Tabs tablet Take 1 tablet by mouth daily. Start taking on:  11/13/2017   predniSONE 10 MG (21) Tbpk tablet Commonly known as:  STERAPRED UNI-PAK 21 TAB Take 6 tabs first day, 5 tab on day 2, then 4 on day 3rd, 3 tabs on day 4th , 2 tab on day 5th, and 1 tab on 6th day.   thiamine 100 MG tablet Take 1 tablet (100 mg total) by mouth daily. Start taking on:  11/13/2017        DISCHARGE INSTRUCTIONS:    Follow with PMD in 1 week.  If you experience worsening of your admission symptoms, develop shortness of breath, life threatening emergency, suicidal or homicidal thoughts you must seek medical attention immediately by calling  911 or calling your MD immediately  if symptoms less severe.  You Must read complete instructions/literature along with all the possible adverse reactions/side effects for all the Medicines you take and that have been prescribed to you. Take any new Medicines after you have completely understood and accept all the possible adverse reactions/side effects.   Please  note  You were cared for by a hospitalist during your hospital stay. If you have any questions about your discharge medications or the care you received while you were in the hospital after you are discharged, you can call the unit and asked to speak with the hospitalist on call if the hospitalist that took care of you is not available. Once you are discharged, your primary care physician will handle any further medical issues. Please note that NO REFILLS for any discharge medications will be authorized once you are discharged, as it is imperative that you return to your primary care physician (or establish a relationship with a primary care physician if you do not have one) for your aftercare needs so that they can reassess your need for medications and monitor your lab values.    Today   CHIEF COMPLAINT:   Chief Complaint  Patient presents with  . Shortness of Breath    HISTORY OF PRESENT ILLNESS:  Carl Bell  is a 67 y.o. male with a known history of COPD, hypertension and lung cancer in remission, status post chemotherapy and radiation therapy last year.  Patient was brought to emergency room for acute onset of shortness of breath, wheezing and dry cough, going on for the past 2 to 3 days, gradually getting worse.  Patient admits for subjective fevers, but he did not check his temperature at home.  When the paramedics found him, his vision saturation was 88% on room air.  His temperature was 100.1 at the arrival to emergency room.  DuoNeb treatment helped with the respiratory distress.  Blood test done in the emergency room showed normal WBC, troponin level and creatinine level.  Chest x-ray reviewed by myself, is suggestive of LT lung interstitial pneumonitis. Patient is admitted for further evaluation and treatment.     VITAL SIGNS:  Blood pressure 135/90, pulse 72, temperature 98.6 F (37 C), temperature source Oral, resp. rate 20, height 6\' 1"  (1.854 m), weight 64.9 kg (143 lb  1.3 oz), SpO2 (!) 78 %.  I/O:    Intake/Output Summary (Last 24 hours) at 11/12/2017 1459 Last data filed at 11/12/2017 0900 Gross per 24 hour  Intake 480 ml  Output 400 ml  Net 80 ml    PHYSICAL EXAMINATION:  GENERAL:  67 y.o.-year-old patient lying in the bed with no acute distress.  EYES: Pupils equal, round, reactive to light and accommodation. No scleral icterus. Extraocular muscles intact.  HEENT: Head atraumatic, normocephalic. Oropharynx and nasopharynx clear.  NECK:  Supple, no jugular venous distention. No thyroid enlargement, no tenderness.  LUNGS: Normal breath sounds bilaterally, no wheezing, rales,rhonchi or crepitation. No use of accessory muscles of respiration.  CARDIOVASCULAR: S1, S2 normal. No murmurs, rubs, or gallops.  ABDOMEN: Soft, non-tender, non-distended. Bowel sounds present. No organomegaly or mass.  EXTREMITIES: No pedal edema, cyanosis, or clubbing.  NEUROLOGIC: Cranial nerves II through XII are intact. Muscle strength 5/5 in all extremities. Sensation intact. Gait not checked.  PSYCHIATRIC: The patient is alert and oriented x 3.  SKIN: No obvious rash, lesion, or ulcer.   DATA REVIEW:   CBC Recent Labs  Lab 11/11/17 [phone]  WBC 3.3*  HGB 13.5  HCT 42.4  PLT 169    Chemistries  Recent Labs  Lab 11/10/17 1909  11/12/17 1004  NA 136   < > 138  K 3.6   < > 3.5  CL 99*   < > 103  CO2 29   < > 26  GLUCOSE 127*   < > 135*  BUN 18   < > 26*  CREATININE 0.95   < > 0.89  CALCIUM 8.9   < > 8.6*  MG  --   --  1.8  AST 23  --   --   ALT 12*  --   --   ALKPHOS 61  --   --   BILITOT 1.2  --   --    < > = values in this interval not displayed.    Cardiac Enzymes Recent Labs  Lab 11/10/17 1909  TROPONINI <0.03    Microbiology Results  Results for orders placed or performed during the hospital encounter of 11/10/17  Blood culture (routine x 2)     Status: None (Preliminary result)   Collection Time: 11/10/17  9:05 PM  Result Value Ref Range  Status   Specimen Description BLOOD LEFT HAND  Final   Special Requests   Final    BOTTLES DRAWN AEROBIC AND ANAEROBIC Blood Culture adequate volume   Culture   Final    NO GROWTH 2 DAYS Performed at [facility], [address] [place], [place] [address]    Report Status PENDING  Incomplete  Blood culture (routine x 2)     Status: None (Preliminary result)   Collection Time: 11/10/17  9:10 PM  Result Value Ref Range Status   Specimen Description BLOOD RIGHT [facility]  Final   Special Requests   Final    BOTTLES DRAWN AEROBIC AND ANAEROBIC Blood Culture adequate volume   Culture   Final    NO GROWTH 2 DAYS Performed at [facility], [address] [place], [place] [address]    Report Status PENDING  Incomplete    RADIOLOGY:  Dg Chest 2 View  Result Date: 11/10/2017 CLINICAL DATA:  Shortness of breath for the past 3 days. History of lung cancer and COPD. Smoker. EXAM: CHEST - 2 VIEW COMPARISON:  08/08/2017 and chest CT dated 10/05/2017. FINDINGS: Normal sized heart. Stable right jugular porta catheter. Interval minimal focal interstitial prominence in the left mid lung zone. The lungs remain hyperexpanded with central peribronchial thickening. Stable linear densities in the lingula and left lower lobe. Mild thoracic spine degenerative changes. IMPRESSION: 1. Interval minimal focal interstitial prominence in the left mid lung zone. This most likely represents interstitial pneumonitis, possibly viral in origin. Followup PA and lateral chest X-ray is recommended in 3-4 weeks following trial of appropriate therapy to ensure resolution and exclude underlying malignancy. 2. Stable changes of COPD and chronic bronchitis. Electronically Signed   By: Claudie Revering M.D.   On: 11/10/2017 20:02    EKG:   Orders placed or performed during the hospital encounter of 08/08/17  . ED EKG  . ED EKG  . EKG 12-Lead  . EKG 12-Lead  . EKG      Management plans discussed with the  patient, family and they are in agreement.  CODE STATUS:     Code Status Orders  (From admission, onward)        Start     Ordered   11/10/17 2302  Full code  Continuous  11/10/17 2301    Code Status History    Date Active Date Inactive Code Status Order ID Comments User Context   12/22/2015 1356 12/23/2015 1350 Full Code [id number]  Idelle Crouch, MD Inpatient      TOTAL TIME TAKING CARE OF THIS PATIENT: 35 minutes.    Vaughan Basta M.D on 11/12/2017 at 2:59 PM  Between 7am to 6pm - Pager - [phone]  After 6pm go to [email or web address] - password [email or web address] [facility]  Office  [phone]  CC: Primary care physician; Kirk Ruths, MD   Note: This dictation was prepared with Dragon dictation along with smaller phrase technology. Any transcriptional errors that result from this process are unintentional.

## 2017-11-12 NOTE — Progress Notes (Signed)
[place] at [place] NAME: Carl Bell    MR#:  [id number]  DATE OF BIRTH:  04-Jun-1951  SUBJECTIVE:  CHIEF COMPLAINT:   Chief Complaint  Patient presents with  . Shortness of Breath   Still needs oxygen and have wheezing.  REVIEW OF SYSTEMS:  CONSTITUTIONAL: No fever, fatigue or weakness.  EYES: No blurred or double vision.  EARS, NOSE, AND THROAT: No tinnitus or ear pain.  RESPIRATORY: No cough, shortness of breath, wheezing or hemoptysis.  CARDIOVASCULAR: No chest pain, orthopnea, edema.  GASTROINTESTINAL: No nausea, vomiting, diarrhea or abdominal pain.  GENITOURINARY: No dysuria, hematuria.  ENDOCRINE: No polyuria, nocturia,  HEMATOLOGY: No anemia, easy bruising or bleeding SKIN: No rash or lesion. MUSCULOSKELETAL: No joint pain or arthritis.   NEUROLOGIC: No tingling, numbness, weakness.  PSYCHIATRY: No anxiety or depression.   ROS  DRUG ALLERGIES:  No Known Allergies  VITALS:  Blood pressure 121/77, pulse 97, temperature 98 F (36.7 C), temperature source Oral, resp. rate 20, height 6\' 1"  (1.854 m), weight 64.9 kg (143 lb 1.3 oz), SpO2 96 %.  PHYSICAL EXAMINATION:  GENERAL:  67 y.o.-year-old patient lying in the bed with no acute distress.  EYES: Pupils equal, round, reactive to light and accommodation. No scleral icterus. Extraocular muscles intact.  HEENT: Head atraumatic, normocephalic. Oropharynx and nasopharynx clear.  NECK:  Supple, no jugular venous distention. No thyroid enlargement, no tenderness.  LUNGS: Normal breath sounds bilaterally, have wheezing, no crepitation. No use of accessory muscles of respiration.  CARDIOVASCULAR: S1, S2 normal. No murmurs, rubs, or gallops.  ABDOMEN: Soft, nontender, nondistended. Bowel sounds present. No organomegaly or mass.  EXTREMITIES: No pedal edema, cyanosis, or clubbing.  NEUROLOGIC: Cranial nerves II through XII are intact. Muscle strength 5/5 in all extremities. Sensation  intact. Gait not checked.  PSYCHIATRIC: The patient is alert and oriented x 3.  SKIN: No obvious rash, lesion, or ulcer.   Physical Exam LABORATORY PANEL:   CBC Recent Labs  Lab 11/11/17 0933  WBC 3.3*  HGB 13.5  HCT 42.4  PLT 169   ------------------------------------------------------------------------------------------------------------------  Chemistries  Recent Labs  Lab 11/10/17 1909 11/11/17 0933  NA 136 136  K 3.6 4.2  CL 99* 100*  CO2 29 28  GLUCOSE 127* 220*  BUN 18 25*  CREATININE 0.95 0.94  CALCIUM 8.9 8.4*  AST 23  --   ALT 12*  --   ALKPHOS 61  --   BILITOT 1.2  --    ------------------------------------------------------------------------------------------------------------------  Cardiac Enzymes Recent Labs  Lab 11/10/17 1909  TROPONINI <0.03   ------------------------------------------------------------------------------------------------------------------  RADIOLOGY:  Dg Chest 2 View  Result Date: 11/10/2017 CLINICAL DATA:  Shortness of breath for the past 3 days. History of lung cancer and COPD. Smoker. EXAM: CHEST - 2 VIEW COMPARISON:  08/08/2017 and chest CT dated 10/05/2017. FINDINGS: Normal sized heart. Stable right jugular porta catheter. Interval minimal focal interstitial prominence in the left mid lung zone. The lungs remain hyperexpanded with central peribronchial thickening. Stable linear densities in the lingula and left lower lobe. Mild thoracic spine degenerative changes. IMPRESSION: 1. Interval minimal focal interstitial prominence in the left mid lung zone. This most likely represents interstitial pneumonitis, possibly viral in origin. Followup PA and lateral chest X-ray is recommended in 3-4 weeks following trial of appropriate therapy to ensure resolution and exclude underlying malignancy. 2. Stable changes of COPD and chronic bronchitis. Electronically Signed   By: Claudie Revering M.D.   On: 11/10/2017  20:02    ASSESSMENT AND PLAN:    Active Problems:   Acute respiratory failure (HCC)   Protein-calorie malnutrition, severe  1.  Acute respiratory failure with hypoxia, secondary to interstitial pneumonitis.    IV antibiotics, ceftriaxone and azithromycin, oxygen therapy, DuoNeb and steroids IV.  Continue to monitor patient clinically closely.  He will need to repeat the chest x-ray in 2 to 3 weeks to ensure resolution of pneumonitis. 2.  Acute COPD exacerbation, likely secondary to interstitial pneumonitis.    Still need supplemental oxygen, encouraged to taper and ambulate today. 3.  Left mid lung interstitial pneumonitis.  See treatment as above under #1. 4.  Lung CA, in remission, status post chemo and radiation therapy.  Patient is to continue to follow-up with Dr. Grayland Ormond of Oncology every 6 months. 5.  Hypertension, stable, restart home medications. 6.  Tobacco abuse.  Smoking cessation was discussed with patient in detail for 4 min.    All the records are reviewed and case discussed with Care Management/Social Workerr. Management plans discussed with the patient, family and they are in agreement.  CODE STATUS: Full.  TOTAL TIME TAKING CARE OF THIS PATIENT: 35 minutes.     POSSIBLE D/C IN 1-2 DAYS, DEPENDING ON CLINICAL CONDITION.   Vaughan Basta M.D on 11/12/2017   Between 7am to 6pm - Pager - [phone]  After 6pm go to [email or web address] - password [email or web address] [facility]  Office  [phone]  CC: Primary care physician; Kirk Ruths, MD  Note: This dictation was prepared with Dragon dictation along with smaller phrase technology. Any transcriptional errors that result from this process are unintentional.

## 2017-11-12 NOTE — Care Management (Signed)
Per attending- there are no discharge concerns and patient is not going to require 02 at home

## 2017-11-12 NOTE — Progress Notes (Signed)
Pt VS stable. A&Ox4. Pt given discharge instruction. Patient given 6 prescriptions. Patient instructed to follow up with PCP. Patient verbalized understanding and denied questions. IV removed. Port wasn't accessed this admission. Brother to transport patient home.  Kyla Balzarine, LPN

## 2017-11-15 LAB — CULTURE, BLOOD (ROUTINE X 2)
CULTURE: NO GROWTH
Culture: NO GROWTH
Special Requests: ADEQUATE
Special Requests: ADEQUATE

## 2017-11-22 NOTE — Progress Notes (Deleted)
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  INTERVAL HISTORY: Patient returns to clinic today for further evaluation and discussion of his imaging results. He continues to feel well. He does not complain of shortness of breath today, but continues to have chronic nonproductive cough. He has no neurologic complaints. He denies any recent fevers. He denies chest pain or hemoptysis.  He denies any nausea, vomiting, constipation, or diarrhea. He has no urinary complaints. Patient offers no further specific complaints  REVIEW OF SYSTEMS:   Review of Systems  Constitutional: Negative.  Negative for fever, malaise/fatigue and weight loss.  Respiratory: Positive for cough. Negative for hemoptysis and shortness of breath.   Cardiovascular: Negative.  Negative for chest pain and leg swelling.  Gastrointestinal: Negative.  Negative for abdominal pain, blood in stool and melena.  Genitourinary: Negative.   Musculoskeletal: Negative.   Skin: Negative.  Negative for rash.  Neurological: Negative.  Negative for sensory change, focal weakness and weakness.  Endo/Heme/Allergies: Does not bruise/bleed easily.  Psychiatric/Behavioral: Negative.  The patient is not nervous/anxious.     As per HPI. Otherwise, a complete review of systems is negative.  PAST MEDICAL HISTORY: Past Medical History:  Diagnosis Date  . COPD (chronic obstructive pulmonary disease) ([place])   . Emphysema of lung ([place])   . Glaucoma   . Hypertension   . Lung cancer ([place])     PAST SURGICAL HISTORY: Past Surgical History:  Procedure Laterality Date  . LUNG BIOPSY    . PERIPHERAL VASCULAR CATHETERIZATION N/A 09/24/2015   Procedure: [name];  Surgeon: Algernon Huxley, MD;  Location: [facility];  Service: Cardiovascular;  Laterality: N/A;  . Port a cath placement      FAMILY HISTORY: Reviewed and unchanged. No reported history of malignancy or chronic disease.     ADVANCED DIRECTIVES:    HEALTH MAINTENANCE: Social History   Tobacco Use  . Smoking status: Current Every Day Smoker    Packs/day: 0.50    Years: 50.00    Pack years: 25.00    Types: Cigarettes  . Smokeless tobacco: Never Used  Substance Use Topics  . Alcohol use: Yes    Alcohol/week: 0.0 oz    Comment: 3 pints/week  . Drug use: No     No Known Allergies  Current Outpatient Medications  Medication Sig Dispense Refill  . albuterol (PROVENTIL HFA;VENTOLIN HFA) 108 (90 Base) MCG/ACT inhaler Inhale 2 puffs into the lungs every 6 (six) hours as needed for wheezing or shortness of breath. 1 Inhaler 3  . feeding supplement, ENSURE ENLIVE, (ENSURE ENLIVE) LIQD Take 237 mLs by mouth 3 (three) times daily between meals. 237 mL 12  . guaiFENesin-dextromethorphan (ROBITUSSIN DM) 100-10 MG/5ML syrup Take 5 mLs by mouth every 4 (four) hours as needed for cough. 118 mL 0  . lidocaine-prilocaine (EMLA) cream Apply 1 application topically as needed. Apply to port 1 hour prior to chemotherapy appointment. Cover with plastic wrap. 30 g 2  . Multiple Vitamin (MULTIVITAMIN WITH MINERALS) TABS tablet Take 1 tablet by mouth daily. 30 tablet 0  . predniSONE (STERAPRED UNI-PAK 21 TAB) 10 MG (21) TBPK tablet Take 6 tabs first day, 5 tab on day 2, then 4  on day 3rd, 3 tabs on day 4th , 2 tab on day 5th, and 1 tab on 6th day. 21 tablet 0  . thiamine 100 MG tablet Take 1 tablet (100 mg total) by mouth daily. 30 tablet 0   No current facility-administered medications for this visit.     OBJECTIVE: There were no vitals filed for this visit.   There is no height or weight on file to calculate BMI.    ECOG FS:0 - Asymptomatic  General:  Well-developed, well-nourished, no acute distress. Eyes: Pink conjunctiva, anicteric sclera. Lungs: Clear to auscultation bilaterally. Heart: Regular rate and rhythm. No rubs, murmurs, or gallops. Abdomen: Soft, nontender, nondistended. No organomegaly noted, normoactive bowel sounds. Musculoskeletal: No edema, cyanosis, or clubbing. Neuro: Alert, answering all questions appropriately. Cranial nerves grossly intact. Skin: No rashes or petechiae noted. Psych: Normal affect.   LAB RESULTS:  Lab Results  Component Value Date   NA 138 11/12/2017   K 3.5 11/12/2017   CL 103 11/12/2017   CO2 26 11/12/2017   GLUCOSE 135 (H) 11/12/2017   BUN 26 (H) 11/12/2017   CREATININE 0.89 11/12/2017   CALCIUM 8.6 (L) 11/12/2017   PROT 7.0 11/10/2017   ALBUMIN 3.4 (L) 11/10/2017   AST 23 11/10/2017   ALT 12 (L) 11/10/2017   ALKPHOS 61 11/10/2017   BILITOT 1.2 11/10/2017   GFRNONAA >60 11/12/2017   GFRAA >60 11/12/2017    Lab Results  Component Value Date   WBC 3.3 (L) 11/11/2017   NEUTROABS 1.7 04/03/2017   HGB 13.5 11/11/2017   HCT 42.4 11/11/2017   MCV 87.6 11/11/2017   PLT 169 11/11/2017     STUDIES: Dg Chest 2 View  Result Date: 11/10/2017 CLINICAL DATA:  Shortness of breath for the past 3 days. History of lung cancer and COPD. Smoker. EXAM: CHEST - 2 VIEW COMPARISON:  08/08/2017 and chest CT dated 10/05/2017. FINDINGS: Normal sized heart. Stable right jugular porta catheter. Interval minimal focal interstitial prominence in the left mid lung zone. The lungs remain hyperexpanded with central peribronchial thickening. Stable linear densities in the lingula and left lower lobe. Mild thoracic spine degenerative changes. IMPRESSION: 1. Interval minimal focal interstitial prominence in the left mid lung zone. This most likely represents interstitial pneumonitis, possibly viral in origin. Followup PA and lateral chest X-ray is recommended in 3-4 weeks following trial of appropriate therapy to  ensure resolution and exclude underlying malignancy. 2. Stable changes of COPD and chronic bronchitis. Electronically Signed   By: Claudie Revering M.D.   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  PLAN:    1. 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 Patient only received one infusion of consolidation chemotherapy on December 27, 2015, but this was discontinued secondary to persistent pancytopenia.  Restaging CT scan on October 05, 2017 reviewed independently and reported as above with no concern of progressive or recurrent disease. Continue imaging approximately every 6 months until June 2019 when patient is 2 years removed from completing his treatment at which time he can be switched to yearly imaging. Return to clinic in 6 months with repeat CT scan and further evaluation. 2. Leukopenia: Mild, monitor.  3.  Hypertension: Patient blood pressure significantly elevated today.  Continue monitoring and treatment per primary care. 4. Cough: Chronic and unchanged, monitor.  Patient expressed understanding and was in agreement with this plan. He also  understands that He can call clinic at any time with any questions, concerns, or complaints.   Lloyd Huger, MD 11/22/17 10:15 PM

## 2017-11-23 ENCOUNTER — Inpatient Hospital Stay: Payer: Medicare HMO | Admitting: Oncology

## 2017-11-23 NOTE — Progress Notes (Signed)
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  INTERVAL HISTORY: Patient returns to clinic today as an add-on for hospital follow-up after being admitted with community-acquired pneumonia.  He recently finished his oral antibiotics and feels back to his baseline.  He does not complain of shortness of breath today, but continues to have chronic cough. He has no neurologic complaints. He denies any recent fevers. He denies chest pain or hemoptysis.  He denies any nausea, vomiting, constipation, or diarrhea. He has no urinary complaints.  Patient offers no specific complaints today.  REVIEW OF SYSTEMS:   Review of Systems  Constitutional: Negative.  Negative for fever, malaise/fatigue and weight loss.  Respiratory: Positive for cough. Negative for hemoptysis and shortness of breath.   Cardiovascular: Negative.  Negative for chest pain and leg swelling.  Gastrointestinal: Negative.  Negative for abdominal pain, blood in stool and melena.  Genitourinary: Negative.   Musculoskeletal: Negative.   Skin: Negative.  Negative for rash.  Neurological: Negative.  Negative for sensory change, focal weakness and weakness.  Endo/Heme/Allergies: Does not bruise/bleed easily.  Psychiatric/Behavioral: Negative.  The patient is not nervous/anxious.     As per HPI. Otherwise, a complete review of systems is negative.  PAST MEDICAL HISTORY: Past Medical History:  Diagnosis Date  . COPD (chronic obstructive pulmonary disease) ([place])   . Emphysema of lung ([place])   . Glaucoma   . Hypertension   . Lung cancer ([place])     PAST SURGICAL HISTORY: Past Surgical History:  Procedure Laterality Date  . LUNG BIOPSY     . PERIPHERAL VASCULAR CATHETERIZATION N/A 09/24/2015   Procedure: [name];  Surgeon: Algernon Huxley, MD;  Location: [facility];  Service: Cardiovascular;  Laterality: N/A;  . Port a cath placement      FAMILY HISTORY: Reviewed and unchanged. No reported history of malignancy or chronic disease.     ADVANCED DIRECTIVES:    HEALTH MAINTENANCE: Social History   Tobacco Use  . Smoking status: Current Every Day Smoker    Packs/day: 0.50    Years: 50.00    Pack years: 25.00    Types: Cigarettes  . Smokeless tobacco: Never Used  Substance Use Topics  . Alcohol use: Yes    Alcohol/week: 0.0 oz    Comment: 3 pints/week  . Drug use: No     No Known Allergies  Current Outpatient Medications  Medication Sig Dispense Refill  . albuterol (PROVENTIL HFA;VENTOLIN HFA) 108 (90 Base) MCG/ACT inhaler Inhale 2 puffs into the lungs every 6 (six) hours as needed for wheezing or shortness of breath. 1 Inhaler 3  . feeding supplement, ENSURE ENLIVE, (ENSURE ENLIVE) LIQD Take 237 mLs by mouth 3 (three) times daily between meals. 237 mL 12  . guaiFENesin-dextromethorphan (ROBITUSSIN DM) 100-10 MG/5ML syrup Take 5 mLs by mouth every 4 (four) hours as needed for cough. 118 mL 0  . Multiple Vitamin (MULTIVITAMIN WITH MINERALS) TABS tablet Take 1 tablet by mouth daily. 30 tablet 0  . thiamine 100 MG tablet Take 1 tablet (100 mg total) by mouth daily. 30 tablet 0  . lidocaine-prilocaine (EMLA) cream Apply 1 application topically as needed. Apply to port 1 hour prior to chemotherapy appointment.  Cover with plastic wrap. (Patient not taking: Reported on 11/25/2017) 30 g 2  . predniSONE (STERAPRED UNI-PAK 21 TAB) 10 MG (21) TBPK tablet Take 6 tabs first day, 5 tab on day 2, then 4 on day 3rd, 3 tabs on day 4th , 2 tab on day 5th, and 1 tab on 6th day. (Patient not taking: Reported on 11/25/2017) 21 tablet 0   No current facility-administered medications for this visit.      OBJECTIVE: Vitals:   11/25/17 1422  BP: (!) 146/88  Pulse: 97  Resp: 18  Temp: 97.8 F (36.6 C)  SpO2: 99%     Body mass index is 19.87 kg/m.    ECOG FS:0 - Asymptomatic  General: Well-developed, well-nourished, no acute distress. Eyes: Pink conjunctiva, anicteric sclera. Lungs: Rare scattered wheezing. Heart: Regular rate and rhythm. No rubs, murmurs, or gallops. Abdomen: Soft, nontender, nondistended. No organomegaly noted, normoactive bowel sounds. Musculoskeletal: No edema, cyanosis, or clubbing. Neuro: Alert, answering all questions appropriately. Cranial nerves grossly intact. Skin: No rashes or petechiae noted. Psych: Normal affect.   LAB RESULTS:  Lab Results  Component Value Date   NA 138 11/12/2017   K 3.5 11/12/2017   CL 103 11/12/2017   CO2 26 11/12/2017   GLUCOSE 135 (H) 11/12/2017   BUN 26 (H) 11/12/2017   CREATININE 0.89 11/12/2017   CALCIUM 8.6 (L) 11/12/2017   PROT 7.0 11/10/2017   ALBUMIN 3.4 (L) 11/10/2017   AST 23 11/10/2017   ALT 12 (L) 11/10/2017   ALKPHOS 61 11/10/2017   BILITOT 1.2 11/10/2017   GFRNONAA >60 11/12/2017   GFRAA >60 11/12/2017    Lab Results  Component Value Date   WBC 3.3 (L) 11/11/2017   NEUTROABS 1.7 04/03/2017   HGB 13.5 11/11/2017   HCT 42.4 11/11/2017   MCV 87.6 11/11/2017   PLT 169 11/11/2017     STUDIES: Dg Chest 2 View  Result Date: 11/10/2017 CLINICAL DATA:  Shortness of breath for the past 3 days. History of lung cancer and COPD. Smoker. EXAM: CHEST - 2 VIEW COMPARISON:  08/08/2017 and chest CT dated 10/05/2017. FINDINGS: Normal sized heart. Stable right jugular porta catheter. Interval minimal focal interstitial prominence in the left mid lung zone. The lungs remain hyperexpanded with central peribronchial thickening. Stable linear densities in the lingula and left lower lobe. Mild thoracic spine degenerative changes. IMPRESSION: 1. Interval minimal focal interstitial prominence in the left mid lung  zone. This most likely represents interstitial pneumonitis, possibly viral in origin. Followup PA and lateral chest X-ray is recommended in 3-4 weeks following trial of appropriate therapy to ensure resolution and exclude underlying malignancy. 2. Stable changes of COPD and chronic bronchitis. Electronically Signed   By: Claudie Revering M.D.   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  PLAN:    1. 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 Patient only received one infusion of consolidation chemotherapy on December 27, 2015, but this was discontinued secondary to persistent pancytopenia.  Restaging CT scan on October 05, 2017 reviewed independently with no concern of progressive or recurrent disease. Continue imaging approximately every 6 months until June 2019 when patient is 2 years removed from completing his treatment at which time he can be switched to yearly imaging.  Patient has been instructed to keep his previously scheduled follow-up appointment for imaging and evaluation in September 2019. 2.  Pneumonia: Resolved.  Patient has now completed his antibiotics. 3.  Leukopenia: Patient's last white blood cell count was 3.3.  Monitor. 4.  Cough: Chronic and unchanged. 5.  Hypertension: Patient's blood pressure is mildly elevated today, monitor.    Patient expressed understanding and was in agreement with this plan. He also understands that He can call clinic at any time with any questions, concerns, or complaints.   Lloyd Huger, MD 11/27/17 2:21 PM

## 2017-11-25 ENCOUNTER — Inpatient Hospital Stay: Payer: Medicare HMO | Attending: Oncology | Admitting: Oncology

## 2017-11-25 ENCOUNTER — Encounter: Payer: Self-pay | Admitting: Oncology

## 2017-11-25 VITALS — BP 146/88 | HR 97 | Temp 97.8°F | Resp 18 | Ht 73.0 in | Wt 150.6 lb

## 2017-11-25 DIAGNOSIS — D72819 Decreased white blood cell count, unspecified: Secondary | ICD-10-CM | POA: Insufficient documentation

## 2017-11-25 DIAGNOSIS — C3410 Malignant neoplasm of upper lobe, unspecified bronchus or lung: Secondary | ICD-10-CM | POA: Diagnosis not present

## 2017-11-25 DIAGNOSIS — R69 Illness, unspecified: Secondary | ICD-10-CM | POA: Diagnosis not present

## 2017-11-25 DIAGNOSIS — F17210 Nicotine dependence, cigarettes, uncomplicated: Secondary | ICD-10-CM | POA: Diagnosis not present

## 2017-11-25 DIAGNOSIS — R05 Cough: Secondary | ICD-10-CM | POA: Insufficient documentation

## 2017-11-25 DIAGNOSIS — I10 Essential (primary) hypertension: Secondary | ICD-10-CM | POA: Diagnosis not present

## 2017-11-25 DIAGNOSIS — J449 Chronic obstructive pulmonary disease, unspecified: Secondary | ICD-10-CM | POA: Insufficient documentation

## 2017-11-25 NOTE — Progress Notes (Signed)
No new changes noted today 

## 2017-12-08 NOTE — Progress Notes (Deleted)
The patient is a 67 year old male with stage IIIb lung cancer, status post radiation/chemo with good response in 2017, subsequent scans have shown no residual disease.  Imaging personally reviewed, chest x-ray 11/10/2017, prior CT chest 10/05/2017, severe apical emphysema with severe hyperinflation.  Per report there was mild pneumonia/pneumonitis changes in the left mid zone of the chest x-ray.

## 2017-12-09 ENCOUNTER — Institutional Professional Consult (permissible substitution): Payer: Medicare HMO | Admitting: Internal Medicine

## 2017-12-17 ENCOUNTER — Ambulatory Visit (INDEPENDENT_AMBULATORY_CARE_PROVIDER_SITE_OTHER): Payer: Medicare HMO | Admitting: Internal Medicine

## 2017-12-17 ENCOUNTER — Encounter: Payer: Self-pay | Admitting: Internal Medicine

## 2017-12-17 VITALS — BP 142/80 | HR 111 | Resp 16 | Ht 71.0 in | Wt 137.0 lb

## 2017-12-17 DIAGNOSIS — J449 Chronic obstructive pulmonary disease, unspecified: Secondary | ICD-10-CM | POA: Diagnosis not present

## 2017-12-17 DIAGNOSIS — Z720 Tobacco use: Secondary | ICD-10-CM

## 2017-12-17 DIAGNOSIS — R69 Illness, unspecified: Secondary | ICD-10-CM | POA: Diagnosis not present

## 2017-12-17 DIAGNOSIS — Z23 Encounter for immunization: Secondary | ICD-10-CM

## 2017-12-17 DIAGNOSIS — F17210 Nicotine dependence, cigarettes, uncomplicated: Secondary | ICD-10-CM | POA: Diagnosis not present

## 2017-12-17 DIAGNOSIS — J441 Chronic obstructive pulmonary disease with (acute) exacerbation: Secondary | ICD-10-CM | POA: Diagnosis not present

## 2017-12-17 MED ORDER — UMECLIDINIUM-VILANTEROL 62.5-25 MCG/INH IN AEPB: 1.0000 | INHALATION_SPRAY | Freq: Every day | RESPIRATORY_TRACT | 10 refills | Status: DC

## 2017-12-17 MED ORDER — PREDNISONE 10 MG (21) PO TBPK: ORAL_TABLET | ORAL | 0 refills | Status: DC

## 2017-12-17 NOTE — Patient Instructions (Signed)
Start Anoro inhaler once daily.  Continue ventolin inhaler as needed.

## 2017-12-17 NOTE — Progress Notes (Signed)
[facility] Consultation      Assessment and Plan:  Severe COPD, group D.  With severe dyspnea on exertion, recent exacerbation/pneumonia. Continue chronic bronchitis symptoms with excess mucus production. - Start Anoro inhaler once daily. -Continue Ventolin inhaler.  If not improved at next visit could consider starting nebulizer treatments to help with mucus clearance.  Nicotine abuse. - Spent 3 minutes of discussion, currently smoking 2 cigarettes a day, discussed importance of smoking cessation.  He is considering quitting.    Date: 12/17/2017  MRN# [id number] Carl Bell 1951-05-30    Carl Bell is a 67 y.o. old male seen in consultation for chief complaint of:    Chief Complaint  Patient presents with  . Consult    Referred by Beckey Rutter for COPD  . Cough    with grey mucus  . Shortness of Breath    with exertion  . Wheezing    daily  . smoker    pt is aware he needs to quit, states he is down to about 2 per day.    HPI:  The patient is a 67 year old male with stage IIIb lung cancer, status post radiation/chemo with good response in 2017, subsequent scans have shown no residual disease. He has dyspnea with activity such as walking. He gets winded with walking around a grocery store. He is currently taking ventolin about 3-4 times per day, and feels like it helps for a short while. He has been coughing up a lot of mucus.  He is smoking about 2 cigs per day, down from a ppd.   **Desat walk 12/17/17; baselin sat at rest on RA was 94% and HR 105. Walked 180 feet with mild dyspnea, sat was 92% ahd HR 108.   **Imaging personally reviewed, chest x-ray 11/10/2017, prior CT chest 10/05/2017, severe apical emphysema with severe hyperinflation.  Per report there was mild pneumonia/pneumonitis changes in the left mid zone of the chest x-ray. **CT chest 10/05/2017; severe emphysema, worst in the apices, but appears significant throughout both lungs including  the bases. **CBC 04/03/2017; absolute eosinophil count equals 100.  PMHX:   Past Medical History:  Diagnosis Date  . COPD (chronic obstructive pulmonary disease) ([place])   . Emphysema of lung ([place])   . Glaucoma   . Hypertension   . Lung cancer [facility]    Surgical Hx:  Past Surgical History:  Procedure Laterality Date  . LUNG BIOPSY    . PERIPHERAL VASCULAR CATHETERIZATION N/A 09/24/2015   Procedure: [name];  Surgeon: Algernon Huxley, MD;  Location: [facility];  Service: Cardiovascular;  Laterality: N/A;  . Port a cath placement     Family Hx:  Family History  Problem Relation Age of Onset  . Diabetes Mellitus II Mother   . Hypertension Father    Social Hx:   Social History   Tobacco Use  . Smoking status: Current Every Day Smoker    Packs/day: 0.50    Years: 50.00    Pack years: 25.00    Types: Cigarettes  . Smokeless tobacco: Never Used  Substance Use Topics  . Alcohol use: Yes    Alcohol/week: 0.0 oz    Comment: 3 pints/week  . Drug use: No   Medication:    Current Outpatient Medications:  .  albuterol (PROVENTIL HFA;VENTOLIN HFA) 108 (90 Base) MCG/ACT inhaler, Inhale 2 puffs into the lungs every 6 (six) hours as needed for wheezing or shortness of breath., Disp: 1 Inhaler,  Rfl: 3 .  feeding supplement, ENSURE ENLIVE, (ENSURE ENLIVE) LIQD, Take 237 mLs by mouth 3 (three) times daily between meals., Disp: 237 mL, Rfl: 12 .  lidocaine-prilocaine (EMLA) cream, Apply 1 application topically as needed. Apply to port 1 hour prior to chemotherapy appointment. Cover with plastic wrap., Disp: 30 g, Rfl: 2 .  Multiple Vitamin (MULTIVITAMIN WITH MINERALS) TABS tablet, Take 1 tablet by mouth daily., Disp: 30 tablet, Rfl: 0 .  thiamine 100 MG tablet, Take 1 tablet (100 mg total) by mouth daily., Disp: 30 tablet, Rfl: 0   Allergies:  Patient has no known allergies.  Review of Systems: Gen:  Denies  fever, sweats, chills HEENT: Denies blurred vision, double  vision. bleeds, sore throat Cvc:  No dizziness, chest pain. Resp:   Denies cough or sputum production, shortness of breath Gi: Denies swallowing difficulty, stomach pain. Gu:  Denies bladder incontinence, burning urine Ext:   No Joint pain, stiffness. Skin: No skin rash,  hives  Endoc:  No polyuria, polydipsia. Psych: No depression, insomnia. Other:  All other systems were reviewed with the patient and were negative other that what is mentioned in the HPI.   Physical Examination:   VS: BP (!) 142/80 (BP Location: Left Arm, Cuff Size: Normal)   Pulse (!) 111   Resp 16   Ht 5\' 11"  (1.803 m)   Wt 137 lb (62.1 kg)   SpO2 99%   BMI 19.11 kg/m   General Appearance: No distress  Neuro:without focal findings,  speech normal,  HEENT: PERRLA, EOM intact.   Pulmonary: Decreased air entry bilaterally. CardiovascularNormal S1,S2.  No m/r/g.   Abdomen: Benign, Soft, non-tender. Renal:  No costovertebral tenderness  GU:  No performed at this time. Endoc: No evident thyromegaly, no signs of acromegaly. Skin:   warm, no rashes, no ecchymosis  Extremities: normal, no cyanosis, clubbing.  Other findings:    LABORATORY PANEL:   CBC No results for input(s): WBC, HGB, HCT, PLT in the last 168 hours. ------------------------------------------------------------------------------------------------------------------  Chemistries  No results for input(s): NA, K, CL, CO2, GLUCOSE, BUN, CREATININE, CALCIUM, MG, AST, ALT, ALKPHOS, BILITOT in the last 168 hours.  Invalid input(s): GFRCGP ------------------------------------------------------------------------------------------------------------------  Cardiac Enzymes No results for input(s): TROPONINI in the last 168 hours. ------------------------------------------------------------  RADIOLOGY:  No results found.     Thank  you for the consultation and for allowing [facility] to assist in the care of your  patient. Our recommendations are noted above.  Please contact us if we can be of further service.   Marda Stalker, MD.  Board Certified in Internal Medicine, Pulmonary Medicine, [place], and Sleep Medicine.  [facility] Office Number: [phone]  Patricia Pesa, M.D.  Merton Border, M.D  12/17/2017

## 2017-12-17 NOTE — Addendum Note (Signed)
Addended by: Stephanie Coup on: 12/17/2017 11:32 AM   Modules accepted: Orders

## 2017-12-17 NOTE — Addendum Note (Signed)
Addended by: Stephanie Coup on: 12/17/2017 12:01 PM   Modules accepted: Orders

## 2017-12-23 DIAGNOSIS — R0902 Hypoxemia: Secondary | ICD-10-CM | POA: Diagnosis not present

## 2017-12-23 DIAGNOSIS — R69 Illness, unspecified: Secondary | ICD-10-CM | POA: Diagnosis not present

## 2017-12-23 DIAGNOSIS — R0602 Shortness of breath: Secondary | ICD-10-CM | POA: Diagnosis not present

## 2017-12-23 DIAGNOSIS — R000 Tachycardia, unspecified: Secondary | ICD-10-CM | POA: Diagnosis not present

## 2017-12-23 DIAGNOSIS — I10 Essential (primary) hypertension: Secondary | ICD-10-CM | POA: Diagnosis not present

## 2017-12-24 ENCOUNTER — Other Ambulatory Visit: Payer: Self-pay

## 2017-12-24 ENCOUNTER — Emergency Department: Payer: Medicare HMO

## 2017-12-24 ENCOUNTER — Inpatient Hospital Stay
Admission: EM | Admit: 2017-12-24 | Discharge: 2017-12-24 | DRG: 871 | Disposition: A | Discharge status: Home / Self Care | Payer: Medicare HMO | Attending: Internal Medicine | Admitting: Internal Medicine

## 2017-12-24 DIAGNOSIS — Z572 Occupational exposure to dust: Secondary | ICD-10-CM | POA: Diagnosis not present

## 2017-12-24 DIAGNOSIS — Z7952 Long term (current) use of systemic steroids: Secondary | ICD-10-CM | POA: Diagnosis not present

## 2017-12-24 DIAGNOSIS — Z79899 Other long term (current) drug therapy: Secondary | ICD-10-CM

## 2017-12-24 DIAGNOSIS — A419 Sepsis, unspecified organism: Principal | ICD-10-CM | POA: Diagnosis present

## 2017-12-24 DIAGNOSIS — R740 Nonspecific elevation of levels of transaminase and lactic acid dehydrogenase [LDH]: Secondary | ICD-10-CM | POA: Diagnosis not present

## 2017-12-24 DIAGNOSIS — J441 Chronic obstructive pulmonary disease with (acute) exacerbation: Secondary | ICD-10-CM | POA: Diagnosis not present

## 2017-12-24 DIAGNOSIS — Z8249 Family history of ischemic heart disease and other diseases of the circulatory system: Secondary | ICD-10-CM | POA: Diagnosis not present

## 2017-12-24 DIAGNOSIS — I10 Essential (primary) hypertension: Secondary | ICD-10-CM | POA: Diagnosis present

## 2017-12-24 DIAGNOSIS — J9600 Acute respiratory failure, unspecified whether with hypoxia or hypercapnia: Secondary | ICD-10-CM | POA: Diagnosis not present

## 2017-12-24 DIAGNOSIS — J439 Emphysema, unspecified: Secondary | ICD-10-CM | POA: Diagnosis present

## 2017-12-24 DIAGNOSIS — J9601 Acute respiratory failure with hypoxia: Secondary | ICD-10-CM | POA: Diagnosis present

## 2017-12-24 DIAGNOSIS — F17210 Nicotine dependence, cigarettes, uncomplicated: Secondary | ICD-10-CM | POA: Diagnosis present

## 2017-12-24 DIAGNOSIS — R627 Adult failure to thrive: Secondary | ICD-10-CM | POA: Diagnosis present

## 2017-12-24 DIAGNOSIS — H409 Unspecified glaucoma: Secondary | ICD-10-CM | POA: Diagnosis present

## 2017-12-24 DIAGNOSIS — R7989 Other specified abnormal findings of blood chemistry: Secondary | ICD-10-CM

## 2017-12-24 DIAGNOSIS — R05 Cough: Secondary | ICD-10-CM | POA: Diagnosis not present

## 2017-12-24 DIAGNOSIS — Z85118 Personal history of other malignant neoplasm of bronchus and lung: Secondary | ICD-10-CM

## 2017-12-24 DIAGNOSIS — J189 Pneumonia, unspecified organism: Secondary | ICD-10-CM | POA: Diagnosis not present

## 2017-12-24 DIAGNOSIS — Z833 Family history of diabetes mellitus: Secondary | ICD-10-CM

## 2017-12-24 DIAGNOSIS — R69 Illness, unspecified: Secondary | ICD-10-CM | POA: Diagnosis not present

## 2017-12-24 DIAGNOSIS — R0602 Shortness of breath: Secondary | ICD-10-CM | POA: Diagnosis not present

## 2017-12-24 LAB — URINALYSIS, COMPLETE (UACMP) WITH MICROSCOPIC
Bacteria, UA: NONE SEEN
Bilirubin Urine: NEGATIVE
Glucose, UA: NEGATIVE mg/dL
Hgb urine dipstick: NEGATIVE
Ketones, ur: 5 mg/dL — AB
Nitrite: NEGATIVE
Protein, ur: NEGATIVE mg/dL
Specific Gravity, Urine: 1.019 (ref 1.005–1.030)
pH: 5 (ref 5.0–8.0)

## 2017-12-24 LAB — PROTIME-INR
INR: 0.92
PROTHROMBIN TIME: 12.3 s (ref 11.4–15.2)

## 2017-12-24 LAB — COMPREHENSIVE METABOLIC PANEL
ALK PHOS: 57 U/L (ref 38–126)
ALT: 17 U/L (ref 17–63)
ANION GAP: 13 (ref 5–15)
AST: 26 U/L (ref 15–41)
Albumin: 3.4 g/dL — ABNORMAL LOW (ref 3.5–5.0)
BILIRUBIN TOTAL: 0.5 mg/dL (ref 0.3–1.2)
BUN: 22 mg/dL — ABNORMAL HIGH (ref 6–20)
CALCIUM: 9.2 mg/dL (ref 8.9–10.3)
CO2: 25 mmol/L (ref 22–32)
CREATININE: 0.78 mg/dL (ref 0.61–1.24)
Chloride: 102 mmol/L (ref 101–111)
GFR calc Af Amer: >60 mL/min (ref >60)
Glucose, Bld: 115 mg/dL — ABNORMAL HIGH (ref 65–99)
Potassium: 3.6 mmol/L (ref 3.5–5.1)
Sodium: 140 mmol/L (ref 135–145)
TOTAL PROTEIN: 6.6 g/dL (ref 6.5–8.1)

## 2017-12-24 LAB — LACTIC ACID, PLASMA
Lactic Acid, Venous: 4.8 mmol/L (ref 0.5–1.9)
Lactic Acid, Venous: 5.1 mmol/L (ref 0.5–1.9)

## 2017-12-24 LAB — CBC WITH DIFFERENTIAL/PLATELET
BASOS PCT: 0 %
Basophils Absolute: 0 10*3/uL (ref 0–0.1)
EOS PCT: 0 %
Eosinophils Absolute: 0 10*3/uL (ref 0–0.7)
HEMATOCRIT: 38 % — AB (ref 40.0–52.0)
Hemoglobin: 12.7 g/dL — ABNORMAL LOW (ref 13.0–18.0)
Lymphocytes Relative: 5 %
Lymphs Abs: 0.5 10*3/uL — ABNORMAL LOW (ref 1.0–3.6)
MCH: 28.4 pg (ref 26.0–34.0)
MCHC: 33.3 g/dL (ref 32.0–36.0)
MCV: 85.3 fL (ref 80.0–100.0)
MONO ABS: 0.5 10*3/uL (ref 0.2–1.0)
MONOS PCT: 6 %
NEUTROS ABS: 8.6 10*3/uL — AB (ref 1.4–6.5)
Neutrophils Relative %: 89 %
PLATELETS: 279 10*3/uL (ref 150–440)
RBC: 4.46 MIL/uL (ref 4.40–5.90)
RDW: 15.8 % — AB (ref 11.5–14.5)
WBC: 9.6 10*3/uL (ref 3.8–10.6)

## 2017-12-24 LAB — TROPONIN I

## 2017-12-24 LAB — PROCALCITONIN
Procalcitonin: <0.1 ng/mL
Procalcitonin: <0.1 ng/mL

## 2017-12-24 LAB — HEMOGLOBIN A1C
Hgb A1c MFr Bld: 5.5 % (ref 4.8–5.6)
Mean Plasma Glucose: 111.15 mg/dL

## 2017-12-24 LAB — BRAIN NATRIURETIC PEPTIDE: B NATRIURETIC PEPTIDE 5: 50 pg/mL (ref 0.0–100.0)

## 2017-12-24 LAB — MRSA PCR SCREENING: MRSA by PCR: NEGATIVE

## 2017-12-24 LAB — LIPASE, BLOOD: LIPASE: 46 U/L (ref 11–51)

## 2017-12-24 LAB — TSH: TSH: 0.375 u[IU]/mL (ref 0.350–4.500)

## 2017-12-24 MED ORDER — GUAIFENESIN-DM 100-10 MG/5ML PO SYRP
5.0000 mL | ORAL_SOLUTION | ORAL | Status: DC | PRN
  Filled 2017-12-24: qty 5

## 2017-12-24 MED ORDER — FOLIC ACID 1 MG PO TABS
1.0000 mg | ORAL_TABLET | Freq: Every day | ORAL | Status: DC
  Administered 2017-12-24: 15:00:00 1 mg via ORAL
  Filled 2017-12-24: qty 1

## 2017-12-24 MED ORDER — DOCUSATE SODIUM 100 MG PO CAPS
100.0000 mg | ORAL_CAPSULE | Freq: Two times a day (BID) | ORAL | Status: DC
  Administered 2017-12-24: 12:00:00 100 mg via ORAL
  Filled 2017-12-24: qty 1

## 2017-12-24 MED ORDER — PREDNISONE 10 MG PO TABS: ORAL_TABLET | ORAL | 0 refills | Status: DC

## 2017-12-24 MED ORDER — IPRATROPIUM-ALBUTEROL 0.5-2.5 (3) MG/3ML IN SOLN
3.0000 mL | Freq: Once | RESPIRATORY_TRACT | Status: AC
  Administered 2017-12-24: 3 mL via RESPIRATORY_TRACT
  Filled 2017-12-24: qty 3

## 2017-12-24 MED ORDER — VANCOMYCIN HCL IN DEXTROSE 1-5 GM/200ML-% IV SOLN
1000.0000 mg | Freq: Two times a day (BID) | INTRAVENOUS | Status: DC
  Filled 2017-12-24 (×2): qty 200

## 2017-12-24 MED ORDER — ONDANSETRON HCL 4 MG/2ML IJ SOLN: 4.0000 mg | Freq: Four times a day (QID) | INTRAMUSCULAR | Status: DC | PRN

## 2017-12-24 MED ORDER — ACETAMINOPHEN 650 MG RE SUPP: 650.0000 mg | Freq: Four times a day (QID) | RECTAL | Status: DC | PRN

## 2017-12-24 MED ORDER — SODIUM CHLORIDE 0.9 % IV SOLN
2.0000 g | Freq: Two times a day (BID) | INTRAVENOUS | Status: DC
  Administered 2017-12-24: 2 g via INTRAVENOUS
  Filled 2017-12-24 (×2): qty 2

## 2017-12-24 MED ORDER — ENOXAPARIN SODIUM 40 MG/0.4ML ~~LOC~~ SOLN: 40.0000 mg | SUBCUTANEOUS | Status: DC

## 2017-12-24 MED ORDER — VANCOMYCIN HCL IN DEXTROSE 1-5 GM/200ML-% IV SOLN
1000.0000 mg | Freq: Once | INTRAVENOUS | Status: AC
  Administered 2017-12-24: 1000 mg via INTRAVENOUS

## 2017-12-24 MED ORDER — SODIUM CHLORIDE 0.9 % IV SOLN
2.0000 g | Freq: Once | INTRAVENOUS | Status: AC
  Administered 2017-12-24: 2 g via INTRAVENOUS

## 2017-12-24 MED ORDER — ONDANSETRON HCL 4 MG PO TABS: 4.0000 mg | ORAL_TABLET | Freq: Four times a day (QID) | ORAL | Status: DC | PRN

## 2017-12-24 MED ORDER — ADULT MULTIVITAMIN W/MINERALS CH
1.0000 | ORAL_TABLET | Freq: Every day | ORAL | Status: DC
  Administered 2017-12-24: 1 via ORAL
  Filled 2017-12-24: qty 1

## 2017-12-24 MED ORDER — PREDNISONE 50 MG PO TABS: 50.0000 mg | ORAL_TABLET | ORAL | Status: DC

## 2017-12-24 MED ORDER — UMECLIDINIUM-VILANTEROL 62.5-25 MCG/INH IN AEPB
1.0000 | INHALATION_SPRAY | Freq: Every day | RESPIRATORY_TRACT | Status: DC
  Administered 2017-12-24: 1 via RESPIRATORY_TRACT
  Filled 2017-12-24: qty 14

## 2017-12-24 MED ORDER — PREDNISONE 10 MG PO TABS: 10.0000 mg | ORAL_TABLET | ORAL | Status: DC

## 2017-12-24 MED ORDER — VITAMIN B-1 100 MG PO TABS
100.0000 mg | ORAL_TABLET | Freq: Every day | ORAL | Status: DC
  Administered 2017-12-24: 12:00:00 100 mg via ORAL
  Filled 2017-12-24: qty 1

## 2017-12-24 MED ORDER — SODIUM CHLORIDE 0.9 % IV SOLN
INTRAVENOUS | Status: AC
  Administered 2017-12-24: 2 g via INTRAVENOUS
  Filled 2017-12-24: qty 2

## 2017-12-24 MED ORDER — ENSURE ENLIVE PO LIQD
237.0000 mL | Freq: Three times a day (TID) | ORAL | Status: DC
  Administered 2017-12-24 (×2): 237 mL via ORAL

## 2017-12-24 MED ORDER — ALBUTEROL SULFATE (2.5 MG/3ML) 0.083% IN NEBU: 2.5000 mg | INHALATION_SOLUTION | RESPIRATORY_TRACT | Status: DC | PRN

## 2017-12-24 MED ORDER — VANCOMYCIN HCL IN DEXTROSE 1-5 GM/200ML-% IV SOLN
INTRAVENOUS | Status: AC
  Administered 2017-12-24: 1000 mg via INTRAVENOUS
  Filled 2017-12-24: qty 200

## 2017-12-24 MED ORDER — METHYLPREDNISOLONE SODIUM SUCC 125 MG IJ SOLR
125.0000 mg | Freq: Once | INTRAMUSCULAR | Status: AC
  Administered 2017-12-24: 125 mg via INTRAVENOUS
  Filled 2017-12-24: qty 2

## 2017-12-24 MED ORDER — SODIUM CHLORIDE 0.9 % IV BOLUS
1000.0000 mL | Freq: Once | INTRAVENOUS | Status: AC
  Administered 2017-12-24: 1000 mL via INTRAVENOUS

## 2017-12-24 MED ORDER — ACETAMINOPHEN 325 MG PO TABS
650.0000 mg | ORAL_TABLET | Freq: Four times a day (QID) | ORAL | Status: DC | PRN
Start: 2017-12-24 — End: 2017-12-24

## 2017-12-24 MED ORDER — SODIUM CHLORIDE 0.9 % IV SOLN
INTRAVENOUS | Status: DC
  Administered 2017-12-24: 13:00:00 via INTRAVENOUS

## 2017-12-24 NOTE — ED Triage Notes (Signed)
Per EMS, pt with increased SHOB and cough, recent dx of pneumonia 2 weeks ago and finished trx today (Prednisone). Hx of lung cancer. Pt 97% on RA at this time, EDP in rm.

## 2017-12-24 NOTE — H&P (Signed)
Carl Bell is an 67 y.o. male.   Chief Complaint: Shortness of breath HPI: Patient with past medical history of COPD, hypertension and lung cancer in remission for 2 years presents to the emergency department complaining of shortness of breath.  The patient states that he occasionally become shortness of breath ever since his radiation treatments but for the last several days he has been coughing and has become more dyspneic.  His cough is productive of scant white sputum.  He denies fevers or chills.  The patient also denies chest pain.  Chest x-ray in the emergency department revealed resolved pneumonia compared to previous film 1 month ago, however the patient met criteria for sepsis which prompted the emergency department staff to initiate sepsis protocol prior to calling the hospitalist service for admission.  Past Medical History:  Diagnosis Date  . COPD (chronic obstructive pulmonary disease) ([place])   . Emphysema of lung ([place])   . Glaucoma   . Hypertension   . Lung cancer [facility])     Past Surgical History:  Procedure Laterality Date  . LUNG BIOPSY    . PERIPHERAL VASCULAR CATHETERIZATION N/A 09/24/2015   Procedure: [name];  Surgeon: Algernon Huxley, MD;  Location: [place] [facility];  Service: Cardiovascular;  Laterality: N/A;  . Port a cath placement      Family History  Problem Relation Age of Onset  . Diabetes Mellitus II Mother   . Hypertension Father    Social History:  reports that he has been smoking cigarettes.  He has a 25.00 pack-year smoking history. He has never used smokeless tobacco. He reports that he drinks alcohol. He reports that he does not use drugs.  Allergies: No Known Allergies   (Not in a hospital admission)  Results for orders placed or performed during the hospital encounter of 12/24/17 (from the past 48 hour(s))  Lactic acid, plasma     Status: Abnormal   Collection Time: 12/24/17  1:04 AM  Result Value Ref Range   Lactic Acid, Venous  4.8 (HH) 0.5 - 1.9 mmol/L    Comment: CRITICAL RESULT CALLED TO, READ BACK BY AND VERIFIED WITH KASEY ROBERTS ON 12/24/17 AT 0150 JAG Performed at [facility] [address] [place], [place] [address]   Comprehensive metabolic panel     Status: Abnormal   Collection Time: 12/24/17  1:04 AM  Result Value Ref Range   Sodium 140 135 - 145 mmol/L   Potassium 3.6 3.5 - 5.1 mmol/L   Chloride 102 101 - 111 mmol/L   CO2 25 22 - 32 mmol/L   Glucose, Bld 115 (H) 65 - 99 mg/dL   BUN 22 (H) 6 - 20 mg/dL   Creatinine, Ser 0.78 0.61 - 1.24 mg/dL   Calcium 9.2 8.9 - 10.3 mg/dL   Total Protein 6.6 6.5 - 8.1 g/dL   Albumin 3.4 (L) 3.5 - 5.0 g/dL   AST 26 15 - 41 U/L   ALT 17 17 - 63 U/L   Alkaline Phosphatase 57 38 - 126 U/L   Total Bilirubin 0.5 0.3 - 1.2 mg/dL   GFR calc non Af Amer >60 >60 mL/min   GFR calc Af Amer >60 >60 mL/min    Comment: (NOTE) The eGFR has been calculated using the CKD EPI equation. This calculation has not been validated in all clinical situations. eGFR's persistently <60 mL/min signify possible Chronic Kidney Disease.    Anion gap 13 5 - 15    Comment:  Performed at [facility], [address] [place], [place] [address]  Lipase, blood     Status: None   Collection Time: 12/24/17  1:04 AM  Result Value Ref Range   Lipase 46 11 - 51 U/L    Comment: Performed at [facility], [address] [place], [place] [address]  Brain natriuretic peptide - IF patient is dyspneic     Status: None   Collection Time: 12/24/17  1:04 AM  Result Value Ref Range   B Natriuretic Peptide 50.0 0.0 - 100.0 pg/mL    Comment: Performed at [facility], [address] [place], [place] [address]  Troponin I     Status: None   Collection Time: 12/24/17  1:04 AM  Result Value Ref Range   Troponin I <0.03 <0.03 ng/mL    Comment: Performed at [facility], [address] [place], [place] [address]  CBC WITH DIFFERENTIAL     Status:  Abnormal   Collection Time: 12/24/17  1:04 AM  Result Value Ref Range   WBC 9.6 3.8 - 10.6 K/uL   RBC 4.46 4.40 - 5.90 MIL/uL   Hemoglobin 12.7 (L) 13.0 - 18.0 g/dL   HCT 38.0 (L) 40.0 - 52.0 %   MCV 85.3 80.0 - 100.0 fL   MCH 28.4 26.0 - 34.0 pg   MCHC 33.3 32.0 - 36.0 g/dL   RDW 15.8 (H) 11.5 - 14.5 %   Platelets 279 150 - 440 K/uL   Neutrophils Relative % 89 %   Neutro Abs 8.6 (H) 1.4 - 6.5 K/uL   Lymphocytes Relative 5 %   Lymphs Abs 0.5 (L) 1.0 - 3.6 K/uL   Monocytes Relative 6 %   Monocytes Absolute 0.5 0.2 - 1.0 K/uL   Eosinophils Relative 0 %   Eosinophils Absolute 0.0 0 - 0.7 K/uL   Basophils Relative 0 %   Basophils Absolute 0.0 0 - 0.1 K/uL    Comment: Performed at [facility], [address] [place], [place] [address]  Procalcitonin     Status: None   Collection Time: 12/24/17  1:04 AM  Result Value Ref Range   Procalcitonin <0.10 ng/mL    Comment:        Interpretation: PCT (Procalcitonin) <= 0.5 ng/mL: Systemic infection (sepsis) is not likely. Local bacterial infection is possible. (NOTE)       Sepsis PCT Algorithm           Lower Respiratory Tract                                      Infection PCT Algorithm    ----------------------------     ----------------------------         PCT < 0.25 ng/mL                PCT < 0.10 ng/mL         Strongly encourage             Strongly discourage   discontinuation of antibiotics    initiation of antibiotics    ----------------------------     -----------------------------       PCT 0.25 - 0.50 ng/mL            PCT 0.10 - 0.25 ng/mL               OR       >80%  decrease in PCT            Discourage initiation of                                            antibiotics      Encourage discontinuation           of antibiotics    ----------------------------     -----------------------------         PCT >= 0.50 ng/mL              PCT 0.26 - 0.50 ng/mL               AND        <80% decrease in PCT              Encourage initiation of                                             antibiotics       Encourage continuation           of antibiotics    ----------------------------     -----------------------------        PCT >= 0.50 ng/mL                  PCT > 0.50 ng/mL               AND         increase in PCT                  Strongly encourage                                      initiation of antibiotics    Strongly encourage escalation           of antibiotics                                     -----------------------------                                           PCT <= 0.25 ng/mL                                                 OR                                        > 80% decrease in PCT                                     Discontinue / Do not initiate  antibiotics Performed at [facility], [address] [place], [place] [address]   Protime-INR     Status: None   Collection Time: 12/24/17  1:04 AM  Result Value Ref Range   Prothrombin Time 12.3 11.4 - 15.2 seconds   INR 0.92     Comment: Performed at [facility], [address] [place], [place] [address]   Dg Chest Port 1 View  Result Date: 12/24/2017 CLINICAL DATA:  Sepsis.  Increasing shortness of breath and cough. EXAM: PORTABLE CHEST 1 VIEW COMPARISON:  Chest radiographs 11/10/2017, CT 10/05/2017 FINDINGS: Right chest port in place with tip in the SVC. Chronic emphysema and hyperinflation, unchanged from prior. Prior interstitial opacity in the left mid lung has resolved. Linear scarring is unchanged. No new focal airspace disease. No pulmonary edema, pleural effusion or pneumothorax. Osseous structures are unchanged. IMPRESSION: 1. No acute findings. 2. Resolved left midlung opacity from prior exam. Underlying scarring persists. 3.  Emphysema (ICD10-J43.9). Electronically Signed   By: Jeb Levering M.D.   On: 12/24/2017 01:29    Review of Systems   Constitutional: Negative for chills and fever.  HENT: Negative for sore throat and tinnitus.   Eyes: Negative for blurred vision and redness.  Respiratory: Positive for cough and shortness of breath.   Cardiovascular: Negative for chest pain, palpitations, orthopnea and PND.  Gastrointestinal: Negative for abdominal pain, diarrhea, nausea and vomiting.  Genitourinary: Negative for dysuria, frequency and urgency.  Musculoskeletal: Negative for joint pain and myalgias.  Skin: Negative for rash.       No lesions  Neurological: Negative for speech change, focal weakness and weakness.  Endo/Heme/Allergies: Does not bruise/bleed easily.       No temperature intolerance  Psychiatric/Behavioral: Negative for depression and suicidal ideas.    Blood pressure (!) 149/85, pulse 90, temperature 97.6 F (36.4 C), temperature source Oral, resp. rate 15, height '6\' 1"'$  (1.854 m), weight 64 kg (141 lb 3 oz), SpO2 98 %. Physical Exam  Vitals reviewed. Constitutional: He is oriented to person, place, and time. He appears well-developed and well-nourished. No distress.  HENT:  Head: Normocephalic and atraumatic.  Eyes: Pupils are equal, round, and reactive to light. Conjunctivae and EOM are normal. No scleral icterus.  Neck: Normal range of motion. Neck supple. No JVD present. No tracheal deviation present. No thyromegaly present.  Cardiovascular: Normal rate, regular rhythm and normal heart sounds.  Respiratory: Effort normal and breath sounds normal. No respiratory distress.  GI: Soft. Bowel sounds are normal. He exhibits no distension. There is no tenderness.  Genitourinary:  Genitourinary Comments: Deferred  Musculoskeletal: Normal range of motion. He exhibits no edema.  Lymphadenopathy:    He has no cervical adenopathy.  Neurological: He is alert and oriented to person, place, and time. No cranial nerve deficit.  Skin: Skin is warm and dry. No rash noted. No erythema.  Psychiatric: He has a  normal mood and affect. His behavior is normal. Judgment and thought content normal.     Assessment/Plan This is a 67 year old male admitted for sepsis. 1.  Sepsis: The patient meets criteria via intermittent tachycardia, tachypnea and elevated lactic acid.  Source possibly recurrent pneumonia or failed outpatient treatment of pneumonia.  Continue broad-spectrum antibiotics.  Follow blood cultures for growth and sensitivities. 2.  Pneumonia: HCAP; obtain sputum samples as possible.  Supplemental oxygen as needed. 3.  COPD: Emphysematous; albuterol as needed.  Continue Anoro and daily prednisone 4.  Hypertension: Uncontrolled; start amlodipine. 5.  DVT prophylaxis:  Lovenox 6.  GI prophylaxis: None  The patient is a full code.  Time spent on admission orders and patient care approximately 45 minutes  Harrie Foreman, MD 12/24/2017, 4:16 AM

## 2017-12-24 NOTE — Discharge Instructions (Signed)
Patient advised smoking cessation

## 2017-12-24 NOTE — ED Provider Notes (Signed)
[facility] Emergency Department Provider Note  ____________________________________________   First MD Initiated Contact with Patient 12/24/17 [phone]     (approximate)  I have reviewed the triage vital signs and the nursing notes.   HISTORY  Chief Complaint Shortness of Breath    HPI Carl Bell is a 67 y.o. male his medical history includes lung cancer followed by Dr. Grayland Ormond and COPD.  He presents by EMS for shortness of breath.  He reports that he has been gradually getting more short of breath over the last few days but it became acutely worse tonight and he could not breathe.  He does not use oxygen at home.  When the first responders arrived they put him on 4 L of oxygen by nasal cannula and that seems to help but he is still taking big breaths and using accessory muscles in spite of an oxygenation of 98 to 100% on 4 L.  He does say that he feels better.  He reports that he was treated within the last month for pneumonia and he finished his antibiotics.  He denies fever/chills, chest pain, nausea, vomiting, and abdominal pain.  He is not currently on chemotherapy But checks in regularly with his oncologist.  He describes his shortness of breath at night as severe and nothing was helping.  Any amount of exertion makes it much worse.  Past Medical History:  Diagnosis Date  . COPD (chronic obstructive pulmonary disease) ([place])   . Emphysema of lung ([place])   . Glaucoma   . Hypertension   . Lung cancer [facility])     Patient Active Problem List   Diagnosis Date Noted  . Protein-calorie malnutrition, severe 11/12/2017  . Acute respiratory failure ([place]) 11/10/2017  . Acute respiratory distress 12/22/2015  . COPD exacerbation ([place]) 12/22/2015  . Acute bronchitis 12/22/2015  . HTN, goal below 140/80 12/22/2015  . Lung cancer, lingula ([place]) 09/23/2015    Past Surgical History:  Procedure Laterality Date  . LUNG BIOPSY    . PERIPHERAL VASCULAR  CATHETERIZATION N/A 09/24/2015   Procedure: [name];  Surgeon: Algernon Huxley, MD;  Location: [place] [facility];  Service: Cardiovascular;  Laterality: N/A;  . Port a cath placement      Prior to Admission medications   Medication Sig Start Date End Date Taking? Authorizing Provider  albuterol (PROVENTIL HFA;VENTOLIN HFA) 108 (90 Base) MCG/ACT inhaler Inhale 2 puffs into the lungs every 6 (six) hours as needed for wheezing or shortness of breath. 11/12/17  Yes Vaughan Basta, MD  feeding supplement, ENSURE ENLIVE, (ENSURE ENLIVE) LIQD Take 237 mLs by mouth 3 (three) times daily between meals. 11/12/17  Yes Vaughan Basta, MD  Multiple Vitamin (MULTIVITAMIN WITH MINERALS) TABS tablet Take 1 tablet by mouth daily. 11/13/17  Yes Vaughan Basta, MD  predniSONE (DELTASONE) 10 MG tablet Take 1 tablet by mouth as directed. 12/17/17  Yes [provider]  thiamine 100 MG tablet Take 1 tablet (100 mg total) by mouth daily. 11/13/17  Yes Vaughan Basta, MD  umeclidinium-vilanterol (ANORO ELLIPTA) 62.5-25 MCG/INH AEPB Inhale 1 puff into the lungs daily. 12/17/17  Yes Laverle Hobby, MD  lidocaine-prilocaine (EMLA) cream Apply 1 application topically as needed. Apply to port 1 hour prior to chemotherapy appointment. Cover with plastic wrap. 10/04/15   Lloyd Huger, MD    Allergies Patient has no known allergies.  Family History  Problem Relation Age of Onset  . Diabetes Mellitus II Mother   . Hypertension  Father     Social History Social History   Tobacco Use  . Smoking status: Current Every Day Smoker    Packs/day: 0.50    Years: 50.00    Pack years: 25.00    Types: Cigarettes  . Smokeless tobacco: Never Used  Substance Use Topics  . Alcohol use: Yes    Alcohol/week: 0.0 oz    Comment: 3 pints/week  . Drug use: No    Review of Systems Constitutional: No fever/chills Eyes: No visual changes. ENT: No sore throat. Cardiovascular:  Denies chest pain. Respiratory: Severe shortness of breath as described above Gastrointestinal: No abdominal pain.  No nausea, no vomiting.  No diarrhea.  No constipation. Genitourinary: Negative for dysuria. Musculoskeletal: Negative for neck pain.  Negative for back pain. Integumentary: Negative for rash. Neurological: Negative for headaches, focal weakness or numbness.   ____________________________________________   PHYSICAL EXAM:  VITAL SIGNS: ED Triage Vitals  Enc Vitals Group     BP      Pulse      Resp      Temp      Temp src      SpO2      Weight      Height      Head Circumference      Peak Flow      Pain Score      Pain Loc      Pain Edu?      Excl. in [place]?     Constitutional: Alert and oriented.  Appears chronically ill and in acute respiratory distress Eyes: Conjunctivae are normal.  Head: Atraumatic. Nose: No congestion/rhinnorhea. Mouth/Throat: Mucous membranes are moist. Neck: No stridor.  No meningeal signs.   Cardiovascular: Normal rate, regular rhythm. Good peripheral circulation. Grossly normal heart sounds. Respiratory: Increased respiratory effort with accessory muscle usage.  He is taking big open mouth breaths and seems to be trying to auto-PEEP.  He has no coarse breath sounds and no obvious wheezing but decreased air movement throughout. Gastrointestinal: Cachectic.  Soft and nontender. No distention.  Musculoskeletal: No lower extremity tenderness nor edema. No gross deformities of extremities. Neurologic:  Normal speech and language. No gross focal neurologic deficits are appreciated.  Skin:  Skin is warm, dry and intact. No rash noted. Psychiatric: Mood and affect are normal. Speech and behavior are normal.  ____________________________________________   LABS (all labs ordered are listed, but only abnormal results are displayed)  Labs Reviewed  LACTIC ACID, PLASMA - Abnormal; Notable for the following components:      Result Value    Lactic Acid, Venous 4.8 (*)    All other components within normal limits  COMPREHENSIVE METABOLIC PANEL - Abnormal; Notable for the following components:   Glucose, Bld 115 (*)    BUN 22 (*)    Albumin 3.4 (*)    All other components within normal limits  CBC WITH DIFFERENTIAL/PLATELET - Abnormal; Notable for the following components:   Hemoglobin 12.7 (*)    HCT 38.0 (*)    RDW 15.8 (*)    Neutro Abs 8.6 (*)    Lymphs Abs 0.5 (*)    All other components within normal limits  CULTURE, BLOOD (ROUTINE X 2)  CULTURE, BLOOD (ROUTINE X 2)  URINE CULTURE  MRSA PCR SCREENING  LIPASE, BLOOD  BRAIN NATRIURETIC PEPTIDE  TROPONIN I  PROCALCITONIN  PROTIME-INR  LACTIC ACID, PLASMA  URINALYSIS, COMPLETE (UACMP) WITH MICROSCOPIC  PROCALCITONIN   ____________________________________________  EKG  ED ECG REPORT I,  Hinda Kehr, the attending physician, personally viewed and interpreted this ECG.  Date: 12/24/2017 EKG Time: 1:03 AM Rate: 105 Rhythm: Sinus tachycardia QRS Axis: normal Intervals: normal ST/T Wave abnormalities: Non-specific ST segment / T-wave changes, but no evidence of acute ischemia. Narrative Interpretation: no evidence of acute ischemia.  Artifact is present due to the patient's frequent coughing  ____________________________________________  RADIOLOGY I, Hinda Kehr, personally viewed and evaluated these images (plain radiographs) as part of my medical decision making, as well as reviewing the written report by the radiologist.  ED MD interpretation:  No acute cardiopulmonary abnormality identified  Official radiology report(s): Dg Chest Port 1 View  Result Date: 12/24/2017 CLINICAL DATA:  Sepsis.  Increasing shortness of breath and cough. EXAM: PORTABLE CHEST 1 VIEW COMPARISON:  Chest radiographs 11/10/2017, CT 10/05/2017 FINDINGS: Right chest port in place with tip in the SVC. Chronic emphysema and hyperinflation, unchanged from prior. Prior interstitial  opacity in the left mid lung has resolved. Linear scarring is unchanged. No new focal airspace disease. No pulmonary edema, pleural effusion or pneumothorax. Osseous structures are unchanged. IMPRESSION: 1. No acute findings. 2. Resolved left midlung opacity from prior exam. Underlying scarring persists. 3.  Emphysema (ICD10-J43.9). Electronically Signed   By: Jeb Levering M.D.   On: 12/24/2017 01:29    ____________________________________________   PROCEDURES  Critical Care performed: No   Procedure(s) performed:   Procedures   ____________________________________________   INITIAL IMPRESSION / ASSESSMENT AND PLAN / ED COURSE  As part of my medical decision making, I reviewed the following data within the [place] notes reviewed and incorporated, Labs reviewed , EKG interpreted  Old chart reviewed, Radiograph reviewed , Discussed with admitting physician  and Notes from prior ED visits    Differential diagnosis includes, but is not limited to, COPD exacerbation, healthcare associated pneumonia, sepsis, pneumothorax, worsening or recurrent lung cancer, CHF.  The patient's presentation is currently most consistent with COPD exacerbation although he reportedly was treated for pneumonia within the last 1 to 2 months.  He is afebrile and not tachycardic at this time.  I suspect that his chronic COPD has been getting worse gradually over the last couple of days.  He denies any other symptoms except for the shortness of breath but he does have a significantly increased work of breathing.  I am treating him with DuoNeb's x3 and Solu-Medrol 125 mg.  I will hold off on both IV fluid and empiric antibiotics until after seeing some more of his work-up.  I am performing a sepsis work-up without calling a code sepsis since currently he does not meet criteria.  Clinical Course as of Dec 25 302  Thu Dec 24, 2017  0210 Patient's lab work is generally reassuring with no  leukocytosis, normal BNP, conference of metabolic panel within normal limits, normal coagulation studies.  Lactic acid is pending.   [CF]  8676 Lactic Acid, Venous(!!): 4.8 [CF]  0220 Lactic 4.8.  No other signs of sepsis.  The patient is noted to have a lactate>4. With the current information available to me, I don't think the patient is in septic shock. The lactate>4, is related to respiratory distress/ respiratory failure and chronic failure to thrive / decreased PO intake.  However, given the risk factors, I will treat empirically with 2L NS IV bolus, cefipime 2 g IV, and vancomycin 1 g IV.  Discussed by phone with Dr. Jannifer Franklin who will admit.    [CF]    Clinical Course User  Index [CF] Hinda Kehr, MD    ____________________________________________  FINAL CLINICAL IMPRESSION(S) / ED DIAGNOSES  Final diagnoses:  Acute respiratory failure, unspecified whether with hypoxia or hypercapnia (HCC)  COPD exacerbation (HCC)  Elevated lactic acid level     MEDICATIONS GIVEN DURING THIS VISIT:  Medications  vancomycin (VANCOCIN) IVPB 1000 mg/200 mL premix (1,000 mg Intravenous New Bag/Given 12/24/17 0252)  sodium chloride 0.9 % bolus 1,000 mL (1,000 mLs Intravenous New Bag/Given 12/24/17 0210)  sodium chloride 0.9 % bolus 1,000 mL (1,000 mLs Intravenous New Bag/Given 12/24/17 0252)  vancomycin (VANCOCIN) IVPB 1000 mg/200 mL premix (has no administration in time range)  ceFEPIme (MAXIPIME) 2 g in sodium chloride 0.9 % 100 mL IVPB (has no administration in time range)  methylPREDNISolone sodium succinate (SOLU-MEDROL) 125 mg/2 mL injection 125 mg (125 mg Intravenous Given 12/24/17 0114)  ipratropium-albuterol (DUONEB) 0.5-2.5 (3) MG/3ML nebulizer solution 3 mL (3 mLs Nebulization Given 12/24/17 0113)  ipratropium-albuterol (DUONEB) 0.5-2.5 (3) MG/3ML nebulizer solution 3 mL (3 mLs Nebulization Given 12/24/17 0113)  ipratropium-albuterol (DUONEB) 0.5-2.5 (3) MG/3ML nebulizer solution 3 mL (3 mLs  Nebulization Given 12/24/17 0113)  ceFEPIme (MAXIPIME) 2 g in sodium chloride 0.9 % 100 mL IVPB (0 g Intravenous Stopped 12/24/17 0247)     ED Discharge Orders    None       Note:  This document was prepared using Dragon voice recognition software and may include unintentional dictation errors.    Hinda Kehr, MD 12/24/17 [phone]

## 2017-12-24 NOTE — Progress Notes (Signed)
Initial Nutrition Assessment  DOCUMENTATION CODES:   Severe malnutrition in context of chronic illness  INTERVENTION:   Ensure Enlive po TID, each supplement provides 350 kcal and 20 grams of protein  MVI, thiamine, folic acid daily   Magic cup TID with meals, each supplement provides 290 kcal and 9 grams of protein  Pt likely at moderate refeeding risk; recommend monitor K, Mg, and P labs.   NUTRITION DIAGNOSIS:   Severe Malnutrition related to chronic illness(lung cancer, COPD) as evidenced by moderate fat depletion, severe muscle depletion.  GOAL:   Patient will meet greater than or equal to 90% of their needs  MONITOR:   PO intake, Supplement acceptance, Labs, Weight trends, Skin, I & O's  REASON FOR ASSESSMENT:   Malnutrition Screening Tool    ASSESSMENT:   67 y/o male with past medical history of COPD, hypertension and lung cancer in remission for 2 years presents to the emergency department complaining of shortness of breath.  Pt admitted for PNA   Met with pt in room today. Pt reports poor appetite and oral intake at baseline but reports decreased oral intake for the past 1-2 weeks. Per chart, pt appears to have lost 11lbs(7%) over the past month; this is significant given the time frame. Suspect admit weight may be slightly lower r/t possible dehydration. Pt denies any trouble chewing or swallowing. Pt reports that he drinks at least 2 vanilla Ensure per day at home. Pt does not take any vitamins. Pt with h/o etoh abuse; recommend daily thiamine and folic acid supplementation. RD will order supplements and MVI to help pt meet his estimated needs. Pt ate 85% of his breakfast this morning but did not have lunch as he reports he ate a late breakfast. Pt did also drink and Ensure today. Pt likely at moderate refeeding risk; recommend monitor K, Mg, and P labs.   Medications reviewed and include: colace, lovenox, MVI, prednisone, thiamine, NaCl @150ml/hr, cefepime   Labs  reviewed:   NUTRITION - FOCUSED PHYSICAL EXAM:    Most Recent Value  Orbital Region  Mild depletion  Upper Arm Region  Moderate depletion  Thoracic and Lumbar Region  Moderate depletion  Buccal Region  Mild depletion  Temple Region  Moderate depletion  Clavicle Bone Region  Moderate depletion  Clavicle and Acromion Bone Region  Moderate depletion  Scapular Bone Region  Moderate depletion  Dorsal Hand  Moderate depletion  Patellar Region  Severe depletion  Anterior Thigh Region  Severe depletion  Posterior Calf Region  Severe depletion  Edema (RD Assessment)  None  Hair  Reviewed  Eyes  Reviewed  Mouth  Reviewed  Skin  Reviewed  Nails  Reviewed     Diet Order:   Diet Order           Diet regular Room service appropriate? Yes; Fluid consistency: Thin  Diet effective now        Diet - low sodium heart healthy         EDUCATION NEEDS:   Education needs have been addressed  Skin:  Skin Assessment: Reviewed RN Assessment  Last BM:  PTA  Height:   Ht Readings from Last 1 Encounters:  12/24/17 6' 1" (1.854 m)    Weight:   Wt Readings from Last 1 Encounters:  12/24/17 139 lb 4.8 oz (63.2 kg)    Ideal Body Weight:  83.6 kg  BMI:  Body mass index is 18.38 kg/m.  Estimated Nutritional Needs:   Kcal:  1900-2200kcal/day     Protein:  95-107g/day   Fluid:  >1.6L/day  Koleen Distance MS, RD, LDN Pager #- [phone] Office#- [phone] After Hours Pager: [phone]

## 2017-12-24 NOTE — Progress Notes (Signed)
Discharge instructions reviewed with patient. Prescriptions and appointments discussed. Patient verbalized understanding. IV removed without complications. Patient awaiting transportation at this time.

## 2017-12-24 NOTE — Progress Notes (Signed)
Pharmacy Antibiotic Note  Carl Bell is a 67 y.o. male admitted on 12/24/2017 with pneumonia.  Pharmacy has been consulted for cefepime and vancomycin dosing.  Plan: 1. Cefepime 2 gm IV Q12H 2. Vancomycin 1 gm IV x 1 in ED followed in approximately 6 hours (stacked dosing) by vancomycin 1 gm IV Q12H, predicted trough 17 mcg/ml, pharmacy will continue to follow and adjust as needed to maintain trough 15 to 20 mcg/ml  Vd 44.8 L, Ke 0.071 hr-1, T1/2 9.8 hr  Height: 6\' 1"  (185.4 cm) Weight: 141 lb 3 oz (64 kg) IBW/kg (Calculated) : 79.9  Temp (24hrs), Avg:97.6 F (36.4 C), Min:97.6 F (36.4 C), Max:97.6 F (36.4 C)  Recent Labs  Lab 12/24/17 0104  WBC 9.6  CREATININE 0.78  LATICACIDVEN 4.8*    Estimated Creatinine Clearance: 80 mL/min (by C-G formula based on SCr of 0.78 mg/dL).    No Known Allergies  Antimicrobials this admission:   Dose adjustments this admission:   Microbiology results:  BCx:   UCx:    Sputum:    MRSA PCR:   Thank you for allowing pharmacy to be a part of this patient's care.  Laural Benes, Pharm.D., BCPS Clinical Pharmacist 12/24/2017 2:40 AM

## 2017-12-24 NOTE — Discharge Summary (Signed)
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 COPD (chronic obstructive pulmonary disease) ([place])   . Emphysema of lung ([place])   . Glaucoma   . Hypertension   . Lung cancer [facility])     HOSPITAL COURSE:  67 year old male admitted for sepsis. 1.  Sepsis on admission---now resolved: The patient meets criteria via intermittent tachycardia, tachypnea and elevated lactic acid.   -clear source of sepsis noted. Patient feels stable back to baseline. Received aggressive IV fluids. Lactic acid improving. -No fever. White count normal. Chest x-ray negative for pneumonia -patient states he works at [employer] and was exposed to dust particles at [employer]. He ran out of his mask and where that triggered shortness of breath. -He is given mask from the hospital  2.  COPD: Emphysematous; albuterol as needed.  Continue Anoro and daily prednisone -patient has agreed on smoking cessation. Will continue steroid taper and then patient will resume his daily 10 mg after the taper. No indication for antibiotics at present denies any productive cough. No fever.   3. Tobacco abuse. Patient agrees on smoking cessation -patient has history of lung cancer. He is followed at the cancer center with Dr. Grayland Ormond  4.  Hypertension: Uncontrolled patient is  advised to monitor blood pressure as outpatient discussed with primary care and get started on antihypertensives if it is on the higher side. Last blood pressure in the hospital systolic was 884 / 66.  5.  DVT prophylaxis: Lovenox  Room air saturations are 96 to 97%. Were discharged to home with outpatient follow-up with primary care and cancer center  CONSULTS OBTAINED:    DRUG ALLERGIES:  No Known Allergies  DISCHARGE MEDICATIONS:   Allergies as of 12/24/2017   No Known Allergies     Medication List    STOP taking these medications   lidocaine-prilocaine cream Commonly known as:  EMLA     TAKE these medications   albuterol 108 (90 Base) MCG/ACT inhaler Commonly known as:  PROVENTIL HFA;VENTOLIN HFA Inhale 2 puffs into the lungs every 6 (six) hours as needed for wheezing or shortness of breath.   feeding supplement (ENSURE ENLIVE) Liqd Take 237 mLs by mouth 3 (three) times daily between meals.   multivitamin with minerals Tabs tablet Take 1 tablet by mouth daily.   predniSONE 10 MG tablet Commonly known as:  DELTASONE Take 1 tablet by mouth as directed. What changed:  Another medication with the same name was added. Make sure you understand how and when to take each.   predniSONE 10 MG tablet Commonly known as:  DELTASONE Take 50 mg daily taper by 10 mg daily then continue 10 mg as per your previous routine. What changed:  You were already taking a medication with the same name, and this prescription was added. Make sure you understand how and when to take each.   thiamine 100 MG  tablet Take 1 tablet (100 mg total) by mouth daily.   umeclidinium-vilanterol 62.5-25 MCG/INH Aepb Commonly known as:  ANORO ELLIPTA Inhale 1 puff into the lungs daily.       If you experience worsening of your admission symptoms, develop shortness of breath, life threatening emergency, suicidal or homicidal thoughts you must seek medical attention immediately by calling 911 or calling  your MD immediately  if symptoms less severe.  You Must read complete instructions/literature along with all the possible adverse reactions/side effects for all the Medicines you take and that have been prescribed to you. Take any new Medicines after you have completely understood and accept all the possible adverse reactions/side effects.   Please note  You were cared for by a hospitalist during your hospital stay. If you have any questions about your discharge medications or the care you received while you were in the hospital after you are discharged, you can call the unit and asked to speak with the hospitalist on call if the hospitalist that took care of you is not available. Once you are discharged, your primary care physician will handle any further medical issues. Please note that NO REFILLS for any discharge medications will be authorized once you are discharged, as it is imperative that you return to your primary care physician (or establish a relationship with a primary care physician if you do not have one) for your aftercare needs so that they can reassess your need for medications and monitor your lab values. Today   SUBJECTIVE   Feels better than yesterday. Mild shortness of breath on exertion  VITAL SIGNS:  Blood pressure 118/66, pulse (!) 111, temperature 98.4 F (36.9 C), temperature source Axillary, resp. rate 18, height 6\' 1"  (1.854 m), weight 63.2 kg (139 lb 4.8 oz), SpO2 100 %.  I/O:    Intake/Output Summary (Last 24 hours) at 12/24/2017 1421 Last data filed at 12/24/2017 1020 Gross per 24 hour  Intake 632.5 ml  Output 300 ml  Net 332.5 ml    PHYSICAL EXAMINATION:  GENERAL:  67 y.o.-year-old patient lying in the bed with no acute distress.  EYES: Pupils equal, round, reactive to light and accommodation. No scleral icterus. Extraocular muscles intact.  HEENT: Head atraumatic, normocephalic. Oropharynx and nasopharynx clear.  NECK:  Supple, no jugular venous  distention. No thyroid enlargement, no tenderness.  LUNGS: Normal breath sounds bilaterally, no wheezing, rales,rhonchi or crepitation. No use of accessory muscles of respiration. Emphysema does chest. Right upper chest port present CARDIOVASCULAR: S1, S2 normal. No murmurs, rubs, or gallops.  ABDOMEN: Soft, non-tender, non-distended. Bowel sounds present. No organomegaly or mass.  EXTREMITIES: No pedal edema, cyanosis, or clubbing.  NEUROLOGIC: Cranial nerves II through XII are intact. Muscle strength 5/5 in all extremities. Sensation intact. Gait not checked.  PSYCHIATRIC: The patient is alert and oriented x 3.  SKIN: No obvious rash, lesion, or ulcer.   DATA REVIEW:   CBC  Recent Labs  Lab 12/24/17 0104  WBC 9.6  HGB 12.7*  HCT 38.0*  PLT 279    Chemistries  Recent Labs  Lab 12/24/17 0104  NA 140  K 3.6  CL 102  CO2 25  GLUCOSE 115*  BUN 22*  CREATININE 0.78  CALCIUM 9.2  AST 26  ALT 17  ALKPHOS 57  BILITOT 0.5    Microbiology Results   Recent Results (from the past 240 hour(s))  Blood Culture (routine x 2)     Status: None (Preliminary result)   Collection  Time: 12/24/17  1:04 AM  Result Value Ref Range Status   Specimen Description BLOOD RIGHT AC  Final   Special Requests   Final    BOTTLES DRAWN AEROBIC AND ANAEROBIC Blood Culture adequate volume   Culture   Final    NO GROWTH < 12 HOURS Performed at [facility], [address], [place], [place] [address]    Report Status PENDING  Incomplete  Blood Culture (routine x 2)     Status: None (Preliminary result)   Collection Time: 12/24/17  1:04 AM  Result Value Ref Range Status   Specimen Description BLOOD LEFT AC  Final   Special Requests   Final    BOTTLES DRAWN AEROBIC AND ANAEROBIC Blood Culture adequate volume   Culture   Final    NO GROWTH < 12 HOURS Performed at [facility], [address] [place], [place] [address]    Report Status PENDING  Incomplete  MRSA PCR Screening      Status: None   Collection Time: 12/24/17  4:40 AM  Result Value Ref Range Status   MRSA by PCR NEGATIVE NEGATIVE Final    Comment:        The GeneXpert MRSA Assay (FDA approved for NASAL specimens only), is one component of a comprehensive MRSA colonization surveillance program. It is not intended to diagnose MRSA infection nor to guide or monitor treatment for MRSA infections. Performed at [facility], [address] [place], [place] [address]     RADIOLOGY:  Dg Chest Port 1 View  Result Date: 12/24/2017 CLINICAL DATA:  Sepsis.  Increasing shortness of breath and cough. EXAM: PORTABLE CHEST 1 VIEW COMPARISON:  Chest radiographs 11/10/2017, CT 10/05/2017 FINDINGS: Right chest port in place with tip in the SVC. Chronic emphysema and hyperinflation, unchanged from prior. Prior interstitial opacity in the left mid lung has resolved. Linear scarring is unchanged. No new focal airspace disease. No pulmonary edema, pleural effusion or pneumothorax. Osseous structures are unchanged. IMPRESSION: 1. No acute findings. 2. Resolved left midlung opacity from prior exam. Underlying scarring persists. 3.  Emphysema (ICD10-J43.9). Electronically Signed   By: Jeb Levering M.D.   On: 12/24/2017 01:29     Management plans discussed with the patient, family and they are in agreement.  CODE STATUS:     Code Status Orders  (From admission, onward)        Start     Ordered   12/24/17 0426  Full code  Continuous     12/24/17 0425    Code Status History    Date Active Date Inactive Code Status Order ID Comments User Context   11/10/2017 2301 11/12/2017 2037 Full Code [id number]  Amelia Jo, MD Inpatient   12/22/2015 1356 12/23/2015 1350 Full Code [id number]  Idelle Crouch, MD Inpatient      TOTAL TIME TAKING CARE OF THIS PATIENT: 40 minutes.    Fritzi Mandes M.D on 12/24/2017 at 2:21 PM  Between 7am to 6pm - Pager - [phone] After 6pm go to [email or web address] - password  [facility]  Office  [phone]  CC: Primary care physician; Kirk Ruths, MD

## 2017-12-25 LAB — URINE CULTURE: Culture: NO GROWTH

## 2017-12-29 LAB — CULTURE, BLOOD (ROUTINE X 2)
CULTURE: NO GROWTH
CULTURE: NO GROWTH
SPECIAL REQUESTS: ADEQUATE
Special Requests: ADEQUATE

## 2018-02-02 IMAGING — MR MR HEAD WO/W CM
10 of 11 series · 40 of 48 positions shown · IV contrast (13 ML MULTIHANCE)
Comparison: PET scan 09/13/2015.

CLINICAL DATA: Lung cancer, staging. No reported neurologic
complaints.

EXAM:
MRI HEAD WITHOUT AND WITH CONTRAST
TECHNIQUE: Multiplanar, multiecho pulse sequences of the brain and surrounding
structures were obtained without and with intravenous contrast.
CONTRAST:  13mL MULTIHANCE GADOBENATE DIMEGLUMINE 529 MG/ML IV SOLN

[Series 2: T1 · sagittal · 5.0mm · 0.45mm/px · 1 of 25 slices shown]
[im 1/25]
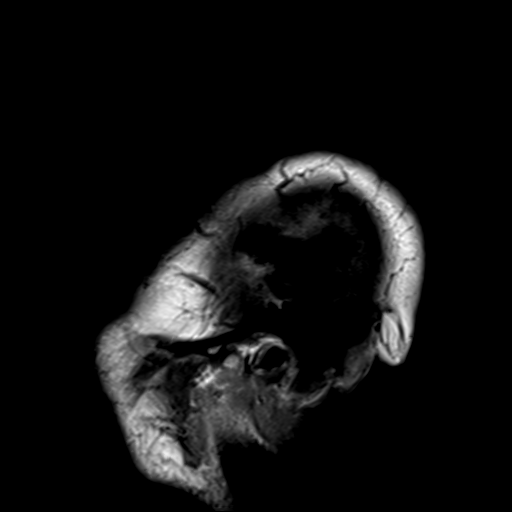

[Series 5: T2 · axial · 5.0mm · 0.60mm/px · z∈[-19,+137]mm · 2 of 25 slices shown (1 of 2)]
[im 1/25]
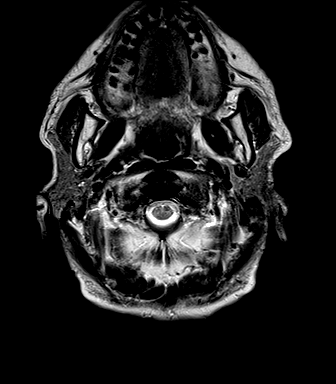
[im 25/25]
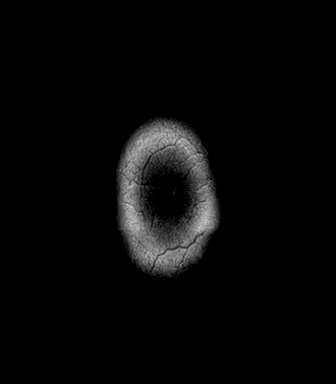

[Series 6: FLAIR · axial · 5.0mm · 0.45mm/px · z∈[-19,+137]mm · 3 of 25 slices shown]
[im 1/25]
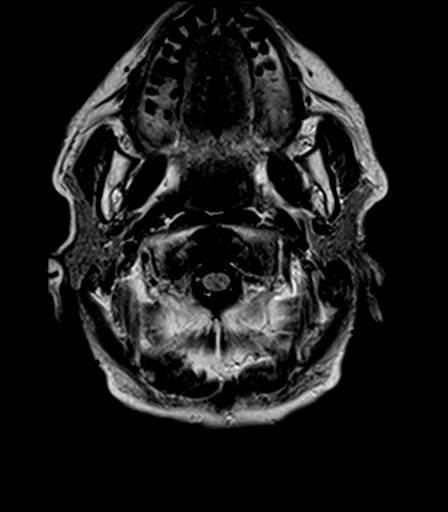
[im 13/25]
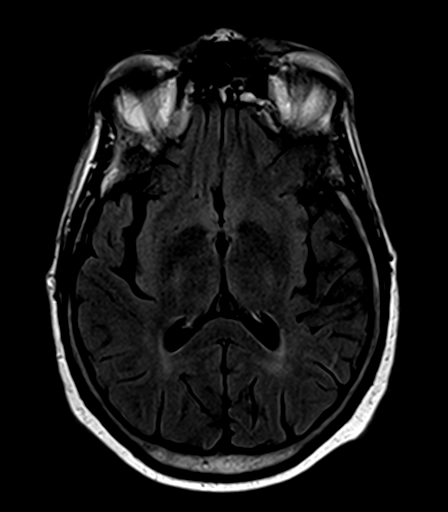
[im 25/25]
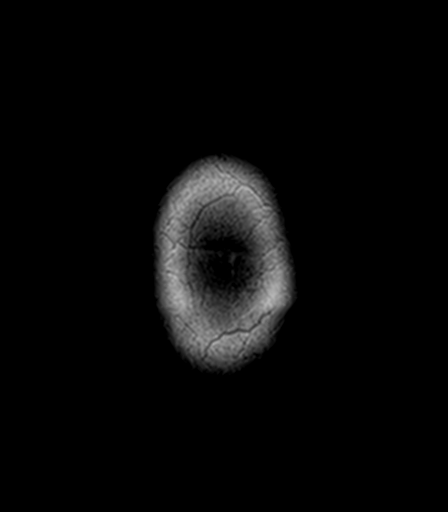

[Series 7: T2 · axial · 5.0mm · 0.45mm/px · z∈[-19,+137]mm · 3 of 25 slices shown (2 of 2)]
[im 1/25]
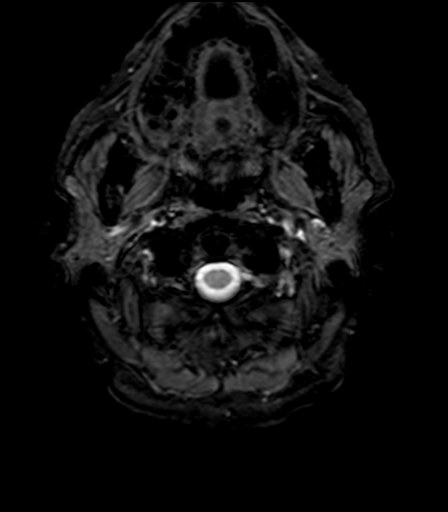
[im 13/25]
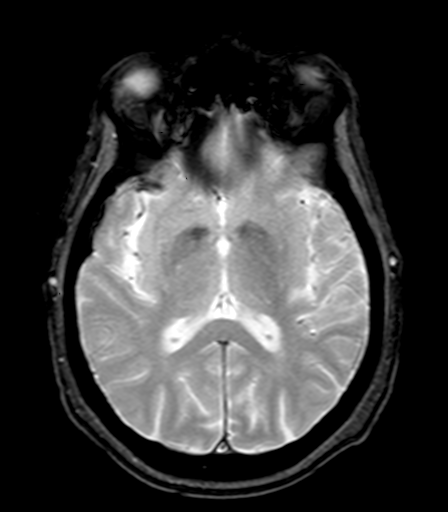
[im 25/25]
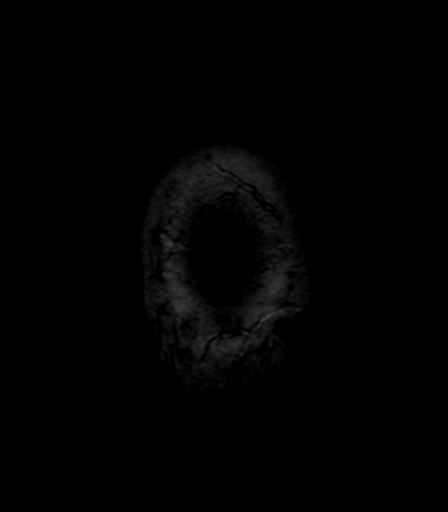

[Series 9: T2 post-contrast · coronal · 5.0mm · 0.49mm/px · 4 of 30 slices shown]
[im 1/30]
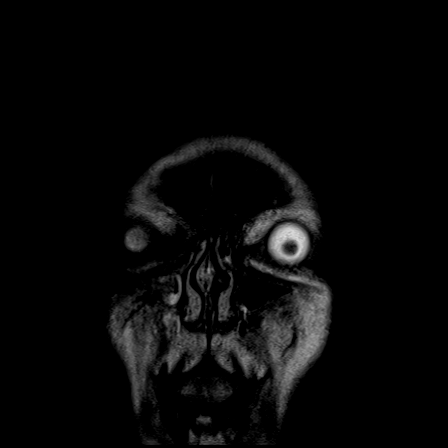
[im 10/30]
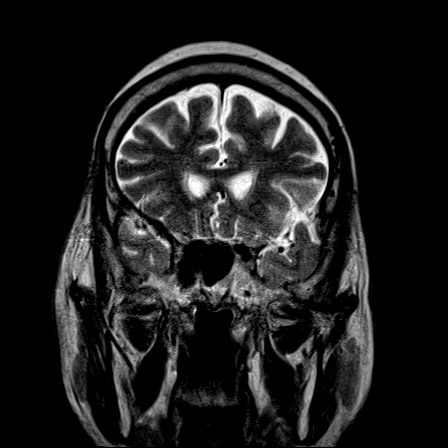
[im 20/30]
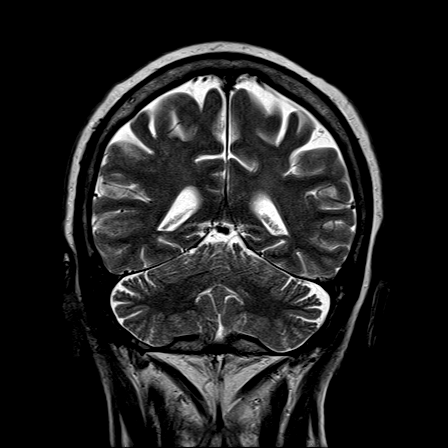
[im 30/30]
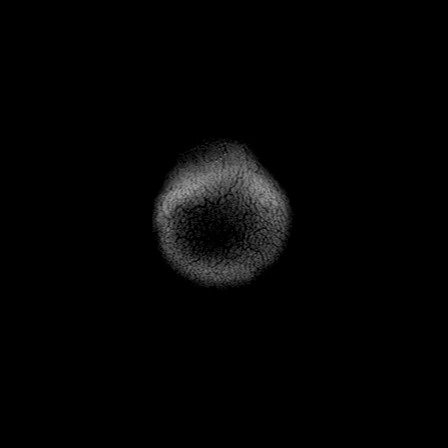

[Series 10: T1 post-contrast · axial · 3.0mm · 1.00mm/px · z∈[-23,+142]mm · 7 of 56 slices shown (1 of 3)]
[im 1/56]
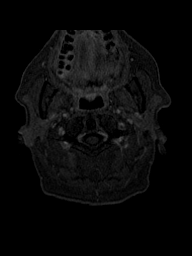
[im 10/56]
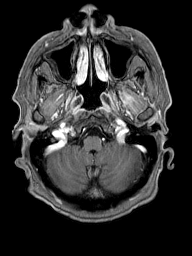
[im 19/56]
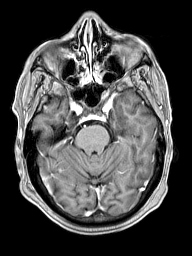
[im 28/56]
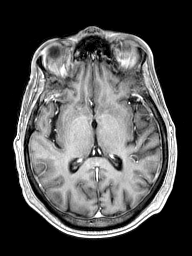
[im 37/56]
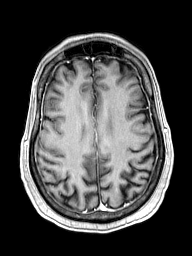
[im 46/56]
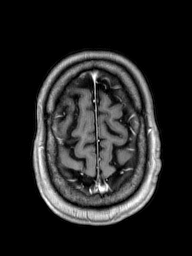
[im 56/56]
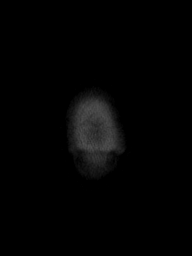

[Series 11: T1 post-contrast · coronal · 5.0mm · 0.43mm/px · 4 of 30 slices shown (2 of 3)]
[im 1/30]
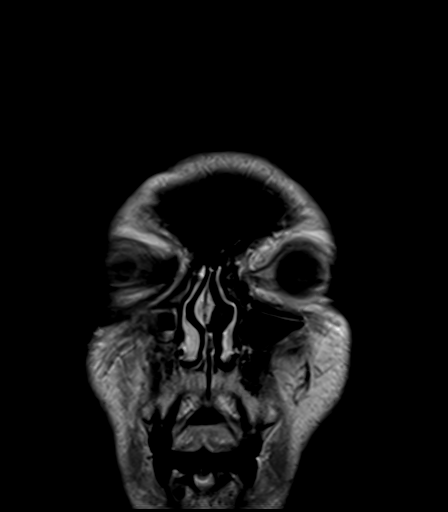
[im 10/30]
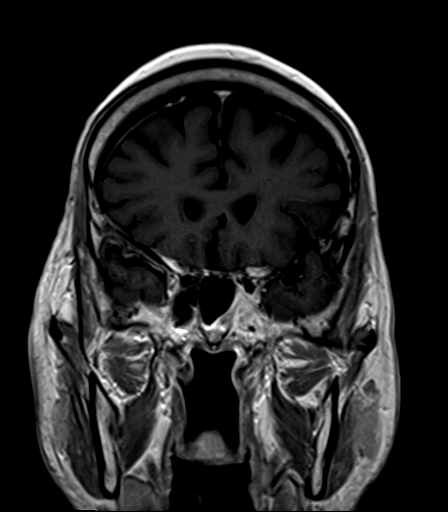
[im 20/30]
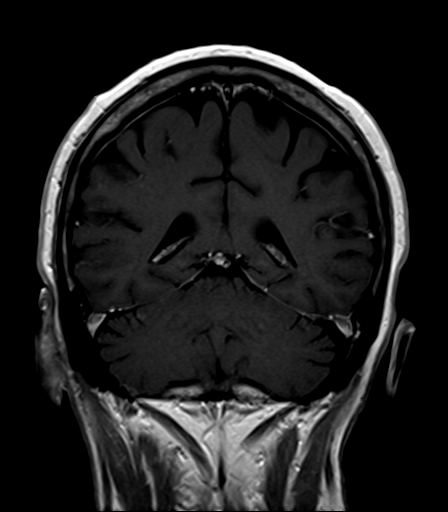
[im 30/30]
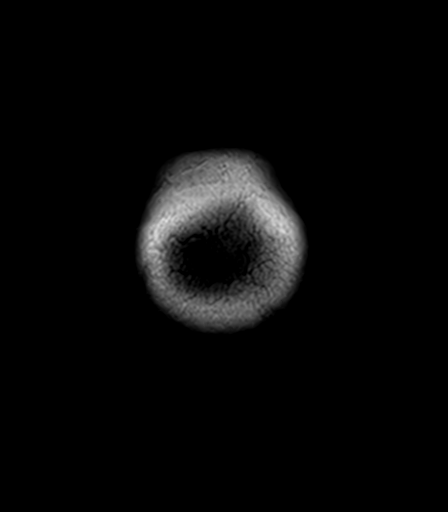

[Series 12: T1 post-contrast · sagittal · 5.0mm · 0.45mm/px · 3 of 25 slices shown (3 of 3)]
[im 1/25]
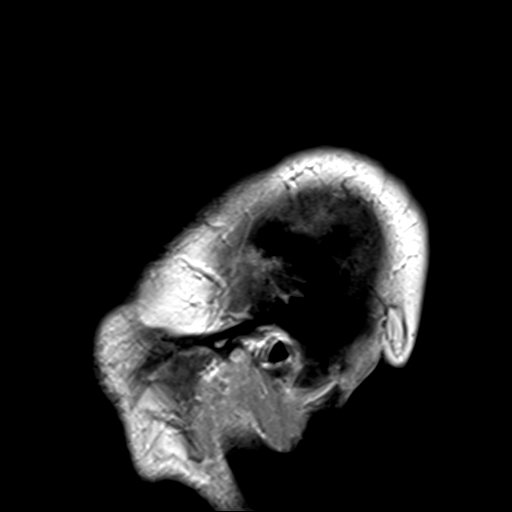
[im 13/25]
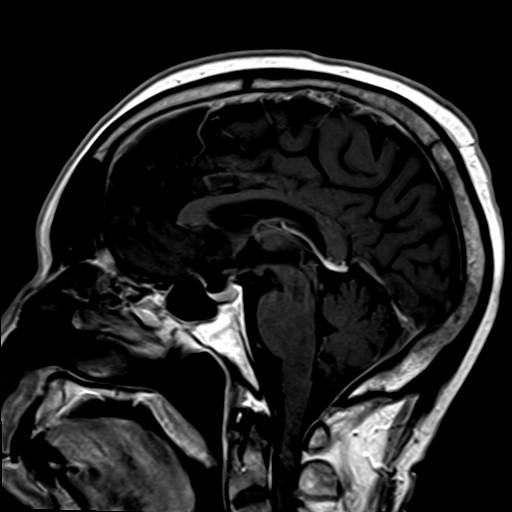
[im 25/25]
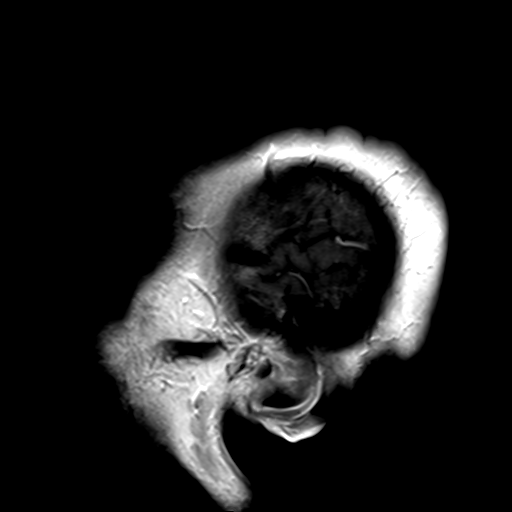

[Series 100: DWI · axial · 3.0mm · 1.80mm/px · z∈[-25,+143]mm · 7 of 57 slices shown]
[im 1/57]
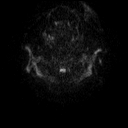
[im 10/57]
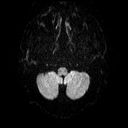
[im 19/57]
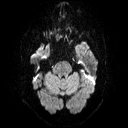
[im 29/57]
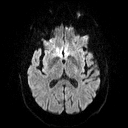
[im 38/57]
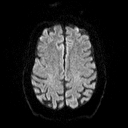
[im 47/57]
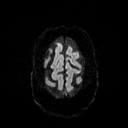
[im 57/57]
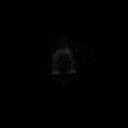

[Series 101: ADC · axial · 3.0mm · 1.80mm/px · z∈[-25,+143]mm · 6 of 54 slices shown]
[im 1/54]
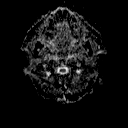
[im 11/54]
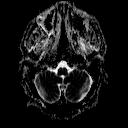
[im 22/54]
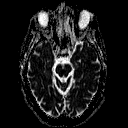
[im 32/54]
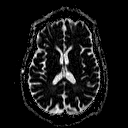
[im 43/54]
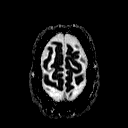
[im 54/54]
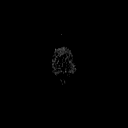

[40 of 48 positions shown; findings below may reference images not displayed]

FINDINGS: No evidence for acute infarction, hemorrhage, mass lesion,
hydrocephalus, or extra-axial fluid. Mild cerebral and cerebellar
atrophy. Mild subcortical and periventricular T2 and FLAIR
hyperintensities, likely chronic microvascular ischemic change.

Flow voids are maintained in the carotid, basilar, and vertebral
arteries. LEFT vertebral dominant. Tiny focus susceptibility in the
LEFT parietal white matter, nonspecific but likely sequelae of
hypertensive vascular disease.

Post infusion, no abnormal enhancement of the brain or meninges.
Major dural venous sinuses are patent.

Mild paranasal sinus disease. No mastoid fluid. No osseous lesion.
Encapsulated 8 x 11 x 27 mm retro mastoid subcutaneous soft tissue
abnormality, favored to represent a benign soft tissue abnormalities
such as sebaceous cyst.
IMPRESSION: Mild atrophy and small vessel disease. No acute intracranial
findings.

No enhancing lesions to suggest metastatic disease.

## 2018-02-03 DIAGNOSIS — Z823 Family history of stroke: Secondary | ICD-10-CM | POA: Diagnosis not present

## 2018-02-03 DIAGNOSIS — Z7982 Long term (current) use of aspirin: Secondary | ICD-10-CM | POA: Diagnosis not present

## 2018-02-03 DIAGNOSIS — H409 Unspecified glaucoma: Secondary | ICD-10-CM | POA: Diagnosis not present

## 2018-02-03 DIAGNOSIS — R32 Unspecified urinary incontinence: Secondary | ICD-10-CM | POA: Diagnosis not present

## 2018-02-03 DIAGNOSIS — Z8249 Family history of ischemic heart disease and other diseases of the circulatory system: Secondary | ICD-10-CM | POA: Diagnosis not present

## 2018-02-03 DIAGNOSIS — Z85118 Personal history of other malignant neoplasm of bronchus and lung: Secondary | ICD-10-CM | POA: Diagnosis not present

## 2018-02-03 DIAGNOSIS — R69 Illness, unspecified: Secondary | ICD-10-CM | POA: Diagnosis not present

## 2018-02-03 DIAGNOSIS — J439 Emphysema, unspecified: Secondary | ICD-10-CM | POA: Diagnosis not present

## 2018-04-12 NOTE — Progress Notes (Deleted)
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  INTERVAL HISTORY: Patient returns to clinic today as an add-on for hospital follow-up after being admitted with community-acquired pneumonia.  He recently finished his oral antibiotics and feels back to his baseline.  He does not complain of shortness of breath today, but continues to have chronic cough. He has no neurologic complaints. He denies any recent fevers. He denies chest pain or hemoptysis.  He denies any nausea, vomiting, constipation, or diarrhea. He has no urinary complaints.  Patient offers no specific complaints today.  REVIEW OF SYSTEMS:   Review of Systems  Constitutional: Negative.  Negative for fever, malaise/fatigue and weight loss.  Respiratory: Positive for cough. Negative for hemoptysis and shortness of breath.   Cardiovascular: Negative.  Negative for chest pain and leg swelling.  Gastrointestinal: Negative.  Negative for abdominal pain, blood in stool and melena.  Genitourinary: Negative.   Musculoskeletal: Negative.   Skin: Negative.  Negative for rash.  Neurological: Negative.  Negative for sensory change, focal weakness and weakness.  Endo/Heme/Allergies: Does not bruise/bleed easily.  Psychiatric/Behavioral: Negative.  The patient is not nervous/anxious.     As per HPI. Otherwise, a complete review of systems is negative.  PAST MEDICAL HISTORY: Past Medical History:  Diagnosis Date  . COPD (chronic obstructive pulmonary disease) ([place])   . Emphysema of lung ([place])   . Glaucoma   . Hypertension   . Lung cancer ([place])     PAST SURGICAL HISTORY: Past Surgical History:  Procedure Laterality Date  . LUNG BIOPSY     . PERIPHERAL VASCULAR CATHETERIZATION N/A 09/24/2015   Procedure: [name];  Surgeon: Algernon Huxley, MD;  Location: [facility];  Service: Cardiovascular;  Laterality: N/A;  . Port a cath placement      FAMILY HISTORY: Reviewed and unchanged. No reported history of malignancy or chronic disease.     ADVANCED DIRECTIVES:    HEALTH MAINTENANCE: Social History   Tobacco Use  . Smoking status: Current Every Day Smoker    Packs/day: 0.50    Years: 50.00    Pack years: 25.00    Types: Cigarettes  . Smokeless tobacco: Never Used  Substance Use Topics  . Alcohol use: Yes    Alcohol/week: 0.0 standard drinks    Comment: 3 pints/week  . Drug use: No     No Known Allergies  Current Outpatient Medications  Medication Sig Dispense Refill  . albuterol (PROVENTIL HFA;VENTOLIN HFA) 108 (90 Base) MCG/ACT inhaler Inhale 2 puffs into the lungs every 6 (six) hours as needed for wheezing or shortness of breath. 1 Inhaler 3  . feeding supplement, ENSURE ENLIVE, (ENSURE ENLIVE) LIQD Take 237 mLs by mouth 3 (three) times daily between meals. 237 mL 12  . Multiple Vitamin (MULTIVITAMIN WITH MINERALS) TABS tablet Take 1 tablet by mouth daily. 30 tablet 0  . predniSONE (DELTASONE) 10 MG tablet Take 1 tablet by mouth as directed.    . predniSONE (DELTASONE) 10 MG tablet Take 50 mg daily taper by 10 mg daily then continue 10 mg as per your previous routine. 15 tablet 0  . thiamine 100 MG tablet Take 1 tablet (100 mg total) by mouth daily. 30 tablet  0  . umeclidinium-vilanterol (ANORO ELLIPTA) 62.5-25 MCG/INH AEPB Inhale 1 puff into the lungs daily. 1 each 10   No current facility-administered medications for this visit.     OBJECTIVE: There were no vitals filed for this visit.   There is no height or weight on file to calculate BMI.    ECOG FS:0 - Asymptomatic  General: Well-developed, well-nourished, no acute distress. Eyes: Pink conjunctiva, anicteric sclera. Lungs: Rare  scattered wheezing. Heart: Regular rate and rhythm. No rubs, murmurs, or gallops. Abdomen: Soft, nontender, nondistended. No organomegaly noted, normoactive bowel sounds. Musculoskeletal: No edema, cyanosis, or clubbing. Neuro: Alert, answering all questions appropriately. Cranial nerves grossly intact. Skin: No rashes or petechiae noted. Psych: Normal affect.   LAB RESULTS:  Lab Results  Component Value Date   NA 140 12/24/2017   K 3.6 12/24/2017   CL 102 12/24/2017   CO2 25 12/24/2017   GLUCOSE 115 (H) 12/24/2017   BUN 22 (H) 12/24/2017   CREATININE 0.78 12/24/2017   CALCIUM 9.2 12/24/2017   PROT 6.6 12/24/2017   ALBUMIN 3.4 (L) 12/24/2017   AST 26 12/24/2017   ALT 17 12/24/2017   ALKPHOS 57 12/24/2017   BILITOT 0.5 12/24/2017   GFRNONAA >60 12/24/2017   GFRAA >60 12/24/2017    Lab Results  Component Value Date   WBC 9.6 12/24/2017   NEUTROABS 8.6 (H) 12/24/2017   HGB 12.7 (L) 12/24/2017   HCT 38.0 (L) 12/24/2017   MCV 85.3 12/24/2017   PLT 279 12/24/2017     STUDIES: No results found.  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  PLAN:    1. 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 Patient only received one infusion of consolidation chemotherapy on December 27, 2015, but this was discontinued secondary to persistent pancytopenia.  Restaging CT scan on October 05, 2017 reviewed independently with no concern of progressive or recurrent disease. Continue imaging approximately every 6 months until June 2019 when patient is 2 years removed from completing his treatment at which time he can be switched to yearly imaging.  Patient has been instructed to keep his previously scheduled follow-up appointment for imaging and evaluation in September 2019. 2.  Pneumonia: Resolved.  Patient has now completed his antibiotics. 3.  Leukopenia: Patient's last white blood cell count was  3.3.  Monitor. 4.  Cough: Chronic and unchanged. 5.  Hypertension: Patient's blood pressure is mildly elevated today, monitor.    Patient expressed understanding and was in agreement with this plan. He also understands that He can call clinic at any time with any questions, concerns, or complaints.   Lloyd Huger, MD 04/12/18 10:48 PM

## 2018-04-13 ENCOUNTER — Ambulatory Visit: Admission: RE | Admit: 2018-04-13 | Payer: Medicare HMO | Source: Ambulatory Visit

## 2018-04-15 ENCOUNTER — Inpatient Hospital Stay: Payer: Medicare HMO | Admitting: Oncology

## 2018-05-13 ENCOUNTER — Other Ambulatory Visit: Payer: Self-pay

## 2018-05-13 ENCOUNTER — Ambulatory Visit
Admission: RE | Admit: 2018-05-13 | Discharge: 2018-05-13 | Disposition: A | Discharge status: Home / Self Care | Payer: Medicare HMO | Source: Ambulatory Visit | Attending: Radiation Oncology | Admitting: Radiation Oncology

## 2018-05-13 ENCOUNTER — Encounter: Payer: Self-pay | Admitting: Radiation Oncology

## 2018-05-13 DIAGNOSIS — F17210 Nicotine dependence, cigarettes, uncomplicated: Secondary | ICD-10-CM | POA: Diagnosis not present

## 2018-05-13 DIAGNOSIS — R69 Illness, unspecified: Secondary | ICD-10-CM | POA: Diagnosis not present

## 2018-05-13 DIAGNOSIS — Z9221 Personal history of antineoplastic chemotherapy: Secondary | ICD-10-CM | POA: Insufficient documentation

## 2018-05-13 DIAGNOSIS — Z923 Personal history of irradiation: Secondary | ICD-10-CM | POA: Diagnosis not present

## 2018-05-13 DIAGNOSIS — C3412 Malignant neoplasm of upper lobe, left bronchus or lung: Secondary | ICD-10-CM

## 2018-05-13 DIAGNOSIS — R634 Abnormal weight loss: Secondary | ICD-10-CM | POA: Insufficient documentation

## 2018-05-13 DIAGNOSIS — Z85118 Personal history of other malignant neoplasm of bronchus and lung: Secondary | ICD-10-CM | POA: Diagnosis not present

## 2018-05-13 NOTE — Progress Notes (Signed)
Radiation Oncology Follow up Note  Name: Carl Bell   Date:   05/13/2018 MRN:  [id number] DOB: [birth year]    This 67 y.o. 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 Seen today in routine follow-up he is doing well. He has lost a little weight although specifically denies dysphagia cough hemoptysis or chest tightness..his last CT scan which I have reviewed was back in March 2019 showing scarring in the lingula no evidence of recurrent or metastatic disease. He is scheduled for a CT scan of the chest next week which I will review when it becomes available.  COMPLICATIONS OF TREATMENT: none  FOLLOW UP COMPLIANCE: keeps appointments   PHYSICAL EXAM:  BP (!) (P) 153/92 (BP Location: Right Arm, Patient Position: Sitting)   Pulse (P) 91   Temp (P) 97.8 F (36.6 C) (Tympanic)   Wt (P) 141 lb 5 oz (64.1 kg)   BMI (P) 18.64 kg/m  Thin male in NAD.Well-developed well-nourished patient in NAD. HEENT reveals PERLA, EOMI, discs not visualized.  Oral cavity is clear. No oral mucosal lesions are identified. Neck is clear without evidence of cervical or supraclavicular adenopathy. Lungs are clear to A&P. Cardiac examination is essentially unremarkable with regular rate and rhythm without murmur rub or thrill. Abdomen is benign with no organomegaly or masses noted. Motor sensory and DTR levels are equal and symmetric in the upper and lower extremities. Cranial nerves II through XII are grossly intact. Proprioception is intact. No peripheral adenopathy or edema is identified. No motor or sensory levels are noted. Crude visual fields are within normal  range.  RADIOLOGY RESULTS: cT scan is reviewed will review his CT scans from next week.  PLAN: present time patient appears to be doing well. Interested in seeing his follow-up CT scan next week. I'm otherwise please was overall progress. I've asked to see him back in 1 year for follow-up. He continues follow-up care with Dr. Grayland Ormond. Patient knows to call with any concerns.  I would like to take this opportunity to thank you for allowing me to participate in the care of your patient.Noreene Filbert, MD

## 2018-05-16 NOTE — Progress Notes (Signed)
[facility]  [phone] [phone]) [phone]  ID: [id number] REGINA OB: 04-Mar-1951  MR#: [id number]  IPJ#:[id number]  Patient Care Team: Kirk Ruths, MD as PCP - General (Internal Medicine) Leona Singleton, RN as Oncology Nurse Navigator  CHIEF COMPLAINT: Stage IIIa squamous cell carcinoma of the lingula of lung.  INTERVAL HISTORY: Patient returns to clinic today for further evaluation and discussion of his imaging results.  He currently feels well and is asymptomatic.  He has no neurologic complaints.  He denies any recent fevers or illnesses.  He has a good appetite and denies weight loss.  He has no chest pain, shortness of breath, cough, or hemoptysis. He denies any nausea, vomiting, constipation, or diarrhea. He has no urinary complaints.  Patient feels at his baseline offers no specific complaints today.  REVIEW OF SYSTEMS:   Review of Systems  Constitutional: Negative.  Negative for fever, malaise/fatigue and weight loss.  Respiratory: Negative.  Negative for cough, hemoptysis and shortness of breath.   Cardiovascular: Negative.  Negative for chest pain and leg swelling.  Gastrointestinal: Negative.  Negative for abdominal pain, blood in stool and melena.  Genitourinary: Negative.  Negative for dysuria.  Musculoskeletal: Negative.  Negative for back pain.  Skin: Negative.  Negative for rash.  Neurological: Negative.  Negative for sensory change, focal weakness and weakness.  Endo/Heme/Allergies: Does not bruise/bleed easily.  Psychiatric/Behavioral: Negative.  The patient is not nervous/anxious.     As per HPI. Otherwise, a complete review of systems is negative.  PAST MEDICAL HISTORY: Past Medical History:  Diagnosis Date  . COPD (chronic obstructive pulmonary disease) ([place])   . Emphysema of lung ([place])   . Glaucoma   . Hypertension   . Lung cancer ([place])     PAST SURGICAL HISTORY: Past Surgical History:  Procedure Laterality Date  .  LUNG BIOPSY    . PERIPHERAL VASCULAR CATHETERIZATION N/A 09/24/2015   Procedure: [name];  Surgeon: Algernon Huxley, MD;  Location: [facility];  Service: Cardiovascular;  Laterality: N/A;  . Port a cath placement      FAMILY HISTORY: Reviewed and unchanged. No reported history of malignancy or chronic disease.     ADVANCED DIRECTIVES:    HEALTH MAINTENANCE: Social History   Tobacco Use  . Smoking status: Current Every Day Smoker    Packs/day: 0.50    Years: 50.00    Pack years: 25.00    Types: Cigarettes  . Smokeless tobacco: Never Used  Substance Use Topics  . Alcohol use: Yes    Alcohol/week: 0.0 standard drinks    Comment: 3 pints/week  . Drug use: No     No Known Allergies  Current Outpatient Medications  Medication Sig Dispense Refill  . feeding supplement, ENSURE ENLIVE, (ENSURE ENLIVE) LIQD Take 237 mLs by mouth 3 (three) times daily between meals. 237 mL 12  . predniSONE (DELTASONE) 10 MG tablet Take 50 mg daily taper by 10 mg daily then continue 10 mg as per your previous routine. 15 tablet 0  . TRAVATAN Z 0.004 % SOLN ophthalmic solution Place 1 drop into both eyes at bedtime.     Marland Kitchen albuterol (PROVENTIL HFA;VENTOLIN HFA) 108 (90 Base) MCG/ACT inhaler Inhale 2 puffs into the lungs every 6 (six) hours as needed for wheezing or shortness of breath. (Patient not taking: Reported on 05/19/2018) 1 Inhaler 3  . Multiple Vitamin (MULTIVITAMIN WITH MINERALS) TABS tablet Take 1 tablet by mouth daily. (Patient not taking: Reported  on 05/19/2018) 30 tablet 0  . predniSONE (DELTASONE) 10 MG tablet Take 1 tablet by mouth as directed.    . thiamine 100 MG tablet Take 1 tablet (100 mg total) by mouth daily. (Patient not taking: Reported on 05/19/2018) 30 tablet 0  . umeclidinium-vilanterol (ANORO ELLIPTA) 62.5-25 MCG/INH AEPB Inhale 1 puff into the lungs daily. (Patient not taking: Reported on 05/19/2018) 1 each 10   No current facility-administered medications for  this visit.     OBJECTIVE: Vitals:   05/19/18 1031  BP: (!) 153/95  Pulse: 94  Resp: 18  Temp: (!) 96.2 F (35.7 C)     Body mass index is 18.09 kg/m.    ECOG FS:0 - Asymptomatic  General: Well-developed, well-nourished, no acute distress. Eyes: Pink conjunctiva, anicteric sclera. HEENT: Normocephalic, moist mucous membranes, clear oropharnyx. Lungs: Rare scattered wheezing. Heart: Regular rate and rhythm. No rubs, murmurs, or gallops. Abdomen: Soft, nontender, nondistended. No organomegaly noted, normoactive bowel sounds. Musculoskeletal: No edema, cyanosis, or clubbing. Neuro: Alert, answering all questions appropriately. Cranial nerves grossly intact. Skin: No rashes or petechiae noted. Psych: Normal affect.  LAB RESULTS:  Lab Results  Component Value Date   NA 140 12/24/2017   K 3.6 12/24/2017   CL 102 12/24/2017   CO2 25 12/24/2017   GLUCOSE 115 (H) 12/24/2017   BUN 22 (H) 12/24/2017   CREATININE 0.70 05/17/2018   CALCIUM 9.2 12/24/2017   PROT 6.6 12/24/2017   ALBUMIN 3.4 (L) 12/24/2017   AST 26 12/24/2017   ALT 17 12/24/2017   ALKPHOS 57 12/24/2017   BILITOT 0.5 12/24/2017   GFRNONAA >60 12/24/2017   GFRAA >60 12/24/2017    Lab Results  Component Value Date   WBC 9.6 12/24/2017   NEUTROABS 8.6 (H) 12/24/2017   HGB 12.7 (L) 12/24/2017   HCT 38.0 (L) 12/24/2017   MCV 85.3 12/24/2017   PLT 279 12/24/2017     STUDIES: Ct Chest W Contrast  Result Date: 05/17/2018 CLINICAL DATA:  Restaging squamous cell lung cancer. Initial diagnosis 2017. Completed chemoradiation. Current Lee asymptomatic. EXAM: CT CHEST WITH CONTRAST TECHNIQUE: Multidetector CT imaging of the chest was performed during intravenous contrast administration. CONTRAST:  76mL ISOVUE-300 IOPAMIDOL (ISOVUE-300) INJECTION 61% COMPARISON:  Chest CT 10/05/2017 FINDINGS: Cardiovascular: The heart is normal in size and stable. No pericardial effusion. Small amount of stable pericardial fluid. Stable  mild tortuosity, ectasia and calcification of the thoracic aorta. No dissection. The branch vessels are patent. Moderate atherosclerotic calcifications at the origin of the left subclavian artery. Stable coronary artery calcifications. Mediastinum/Nodes: Stable small scattered sub 8 mm mediastinal and hilar lymph nodes. No mass or overt adenopathy. No new or progressive findings. The esophagus is grossly normal. Lungs/Pleura: Stable severe emphysematous changes and pulmonary scarring. Stable lingular scarring changes likely due to radiation. No findings to suggest residual or recurrent tumor. No new pulmonary lesions. No pulmonary nodules to suggest pulmonary metastatic disease. The tracheobronchial tree is unremarkable and stable. No pleural effusions or infiltrates. Upper Abdomen: No significant upper abdominal findings. Stable advanced atherosclerotic calcifications involving the aorta and branch vessels. Stable rim calcified lesion involving the left kidney medially. Stable right renal cyst. No worrisome hepatic or adrenal gland lesions to suggest metastatic disease. Musculoskeletal: No chest wall mass, supraclavicular or axillary adenopathy. Small scattered lymph nodes are stable. The bony structures are intact. No lytic or sclerotic bone lesions to suggest metastatic disease. IMPRESSION: 1. Stable radiation changes involving the right middle lobe. No findings suspicious for residual or  recurrent tumor. 2. Small scattered mediastinal and hilar lymph nodes are stable. No evidence of metastatic adenopathy. 3. No new pulmonary lesions or worrisome pulmonary nodules to suggest metastatic disease. 4. Stable severe emphysematous changes and pulmonary scarring. 5. Stable advanced atherosclerotic calcifications involving the thoracic and upper abdominal aorta and branch vessels. Aortic Atherosclerosis (ICD10-I70.0) and Emphysema (ICD10-J43.9). Electronically Signed   By: Marijo Sanes M.D.   On: 05/17/2018 16:12     ASSESSMENT: Stage IIIa squamous cell carcinoma of the lingula of lung.  PLAN:    1. 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 Patient only received one infusion of consolidation chemotherapy on December 27, 2015, but this was discontinued secondary to persistent pancytopenia.  Patient's most recent CT scan on May 17, 2018 reviewed independently and report as above with no obvious evidence of recurrent or progressive disease.  Patient is now 2 years removed from completing his treatments and can be transitioned to yearly imaging.  Return to clinic in 1 year with repeat CT scan and further evaluation. 2.  Cough: Patient does not complain of this today. 3.  Hypertension: Patient's blood pressure mildly elevated today.  Continue monitoring and treatment per primary care.   Patient expressed understanding and was in agreement with this plan. He also understands that He can call clinic at any time with any questions, concerns, or complaints.   Lloyd Huger, MD 05/20/18 6:35 AM

## 2018-05-17 ENCOUNTER — Ambulatory Visit
Admission: RE | Admit: 2018-05-17 | Discharge: 2018-05-17 | Disposition: A | Discharge status: Home / Self Care | Payer: Medicare HMO | Source: Ambulatory Visit | Attending: Oncology | Admitting: Oncology

## 2018-05-17 DIAGNOSIS — J439 Emphysema, unspecified: Secondary | ICD-10-CM | POA: Diagnosis not present

## 2018-05-17 DIAGNOSIS — C3490 Malignant neoplasm of unspecified part of unspecified bronchus or lung: Secondary | ICD-10-CM | POA: Diagnosis not present

## 2018-05-17 DIAGNOSIS — I2510 Atherosclerotic heart disease of native coronary artery without angina pectoris: Secondary | ICD-10-CM | POA: Diagnosis not present

## 2018-05-17 DIAGNOSIS — Z923 Personal history of irradiation: Secondary | ICD-10-CM | POA: Insufficient documentation

## 2018-05-17 DIAGNOSIS — C3410 Malignant neoplasm of upper lobe, unspecified bronchus or lung: Secondary | ICD-10-CM | POA: Insufficient documentation

## 2018-05-17 DIAGNOSIS — I700 Atherosclerosis of aorta: Secondary | ICD-10-CM | POA: Insufficient documentation

## 2018-05-17 LAB — POCT I-STAT CREATININE: Creatinine, Ser: 0.7 mg/dL (ref 0.61–1.24)

## 2018-05-17 MED ORDER — IOPAMIDOL (ISOVUE-300) INJECTION 61%
75.0000 mL | Freq: Once | INTRAVENOUS | Status: AC | PRN
  Administered 2018-05-17: 75 mL via INTRAVENOUS

## 2018-05-19 ENCOUNTER — Other Ambulatory Visit: Payer: Self-pay

## 2018-05-19 ENCOUNTER — Inpatient Hospital Stay: Payer: Medicare HMO | Attending: Oncology | Admitting: Oncology

## 2018-05-19 VITALS — BP 153/95 | HR 94 | Temp 96.2°F | Resp 18 | Wt 137.1 lb

## 2018-05-19 DIAGNOSIS — C3410 Malignant neoplasm of upper lobe, unspecified bronchus or lung: Secondary | ICD-10-CM

## 2018-05-19 DIAGNOSIS — I10 Essential (primary) hypertension: Secondary | ICD-10-CM | POA: Diagnosis not present

## 2018-05-19 DIAGNOSIS — Z85118 Personal history of other malignant neoplasm of bronchus and lung: Secondary | ICD-10-CM

## 2018-05-19 DIAGNOSIS — Z9221 Personal history of antineoplastic chemotherapy: Secondary | ICD-10-CM | POA: Diagnosis not present

## 2018-05-19 DIAGNOSIS — Z923 Personal history of irradiation: Secondary | ICD-10-CM | POA: Diagnosis not present

## 2018-05-19 NOTE — Progress Notes (Signed)
Here ofr follow up. Overall stated he feels good  Stated intermittent cough -" im still smoking -1/2 ppd" phlegm light yellow.

## 2018-08-28 ENCOUNTER — Other Ambulatory Visit: Payer: Self-pay | Admitting: Nurse Practitioner

## 2018-10-18 ENCOUNTER — Telehealth: Payer: Self-pay | Admitting: *Deleted

## 2018-10-18 NOTE — Telephone Encounter (Signed)
Wife answerde phone whrn I returned call again and she was instructed to have hi go to Urgent Care or PCP

## 2018-10-18 NOTE — Telephone Encounter (Signed)
Patient called reporting that he has a cold and cough, He has tried Robitussin and Mucinex , but not helping. He is afebrile, and what he coughs up is clear and white. Asking for prescription to be sent in for him

## 2018-10-18 NOTE — Telephone Encounter (Signed)
Should go to urgent care or PCP.

## 2018-10-30 ENCOUNTER — Emergency Department: Payer: Medicare HMO

## 2018-10-30 ENCOUNTER — Encounter: Payer: Self-pay | Admitting: Emergency Medicine

## 2018-10-30 ENCOUNTER — Emergency Department
Admission: EM | Admit: 2018-10-30 | Discharge: 2018-10-30 | Disposition: A | Discharge status: Home / Self Care | Payer: Medicare HMO | Attending: Emergency Medicine | Admitting: Emergency Medicine

## 2018-10-30 ENCOUNTER — Other Ambulatory Visit: Payer: Self-pay

## 2018-10-30 DIAGNOSIS — R0602 Shortness of breath: Secondary | ICD-10-CM | POA: Diagnosis not present

## 2018-10-30 DIAGNOSIS — I10 Essential (primary) hypertension: Secondary | ICD-10-CM | POA: Insufficient documentation

## 2018-10-30 DIAGNOSIS — Z20828 Contact with and (suspected) exposure to other viral communicable diseases: Secondary | ICD-10-CM | POA: Insufficient documentation

## 2018-10-30 DIAGNOSIS — R05 Cough: Secondary | ICD-10-CM | POA: Diagnosis not present

## 2018-10-30 DIAGNOSIS — F17210 Nicotine dependence, cigarettes, uncomplicated: Secondary | ICD-10-CM | POA: Insufficient documentation

## 2018-10-30 DIAGNOSIS — Z85118 Personal history of other malignant neoplasm of bronchus and lung: Secondary | ICD-10-CM | POA: Diagnosis not present

## 2018-10-30 DIAGNOSIS — J189 Pneumonia, unspecified organism: Secondary | ICD-10-CM | POA: Diagnosis not present

## 2018-10-30 DIAGNOSIS — Z209 Contact with and (suspected) exposure to unspecified communicable disease: Secondary | ICD-10-CM | POA: Diagnosis not present

## 2018-10-30 DIAGNOSIS — J181 Lobar pneumonia, unspecified organism: Secondary | ICD-10-CM

## 2018-10-30 DIAGNOSIS — J439 Emphysema, unspecified: Secondary | ICD-10-CM | POA: Insufficient documentation

## 2018-10-30 LAB — CBC WITH DIFFERENTIAL/PLATELET
Abs Immature Granulocytes: 0.01 10*3/uL (ref 0.00–0.07)
Basophils Absolute: 0 10*3/uL (ref 0.0–0.1)
Basophils Relative: 1 %
Eosinophils Absolute: 0.1 10*3/uL (ref 0.0–0.5)
Eosinophils Relative: 1 %
HCT: 38.4 % — ABNORMAL LOW (ref 39.0–52.0)
Hemoglobin: 12.6 g/dL — ABNORMAL LOW (ref 13.0–17.0)
Immature Granulocytes: 0 %
Lymphocytes Relative: 13 %
Lymphs Abs: 0.9 10*3/uL (ref 0.7–4.0)
MCH: 27.5 pg (ref 26.0–34.0)
MCHC: 32.8 g/dL (ref 30.0–36.0)
MCV: 83.8 fL (ref 80.0–100.0)
Monocytes Absolute: 0.8 10*3/uL (ref 0.1–1.0)
Monocytes Relative: 12 %
Neutro Abs: 4.8 10*3/uL (ref 1.7–7.7)
Neutrophils Relative %: 73 %
Platelets: 215 10*3/uL (ref 150–400)
RBC: 4.58 MIL/uL (ref 4.22–5.81)
RDW: 15.1 % (ref 11.5–15.5)
WBC: 6.6 10*3/uL (ref 4.0–10.5)
nRBC: 0 % (ref 0.0–0.2)

## 2018-10-30 LAB — COMPREHENSIVE METABOLIC PANEL
ALT: 9 U/L (ref 0–44)
AST: 21 U/L (ref 15–41)
Albumin: 4 g/dL (ref 3.5–5.0)
Alkaline Phosphatase: 67 U/L (ref 38–126)
Anion gap: 9 (ref 5–15)
BUN: 13 mg/dL (ref 8–23)
CO2: 28 mmol/L (ref 22–32)
Calcium: 9.4 mg/dL (ref 8.9–10.3)
Chloride: 102 mmol/L (ref 98–111)
Creatinine, Ser: 0.77 mg/dL (ref 0.61–1.24)
GFR calc Af Amer: >60 mL/min (ref >60)
GFR calc non Af Amer: >60 mL/min (ref >60)
Glucose, Bld: 87 mg/dL (ref 70–99)
Potassium: 4.4 mmol/L (ref 3.5–5.1)
Sodium: 139 mmol/L (ref 135–145)
Total Bilirubin: 1 mg/dL (ref 0.3–1.2)
Total Protein: 7.1 g/dL (ref 6.5–8.1)

## 2018-10-30 LAB — TROPONIN I: Troponin I: <0.03 ng/mL (ref <0.03)

## 2018-10-30 LAB — BRAIN NATRIURETIC PEPTIDE: B Natriuretic Peptide: 53 pg/mL (ref 0.0–100.0)

## 2018-10-30 LAB — SARS CORONAVIRUS 2 BY RT PCR (HOSPITAL ORDER, PERFORMED IN ~~LOC~~ HOSPITAL LAB): SARS Coronavirus 2: NEGATIVE

## 2018-10-30 MED ORDER — LEVOFLOXACIN IN D5W 750 MG/150ML IV SOLN
750.0000 mg | Freq: Once | INTRAVENOUS | Status: AC
  Administered 2018-10-30: 750 mg via INTRAVENOUS
  Filled 2018-10-30 (×3): qty 150

## 2018-10-30 MED ORDER — LEVOFLOXACIN 750 MG PO TABS: 750.0000 mg | ORAL_TABLET | Freq: Every day | ORAL | 0 refills | Status: DC

## 2018-10-30 MED ORDER — HYDROCOD POLST-CPM POLST ER 10-8 MG/5ML PO SUER: 5.0000 mL | Freq: Two times a day (BID) | ORAL | 0 refills | Status: DC

## 2018-10-30 MED ORDER — PREDNISONE 50 MG PO TABS: ORAL_TABLET | ORAL | 0 refills | Status: DC

## 2018-10-30 NOTE — ED Notes (Signed)
awaiting pharmacy verification

## 2018-10-30 NOTE — ED Notes (Addendum)
Call to pharm for meds to be verified

## 2018-10-30 NOTE — ED Notes (Signed)
Patient did not want to wait for IV dose of Levaquin to be completed, RN stopped infusion with 20 minutes left and notified MD. Patient given discharge paperwork, RN reviewed paperwork and prescriptions to patient's understanding. Patient ambulatory at discharge.

## 2018-10-30 NOTE — ED Provider Notes (Signed)
[facility] Emergency Department Provider Note       Time seen: ----------------------------------------- 7:06 PM on 10/30/2018 -----------------------------------------   I have reviewed the triage vital signs and the nursing notes.  HISTORY   Chief Complaint Shortness of Breath and Cough   HPI DARROL BRANDENBURG is a 68 y.o. male with a history of COPD, hypertension, lung cancer in remission, bronchitis who presents to the ED for shortness of breath and cough.  Patient reports having sickness for the past 3 weeks.  He has had some weakness and feels like he has no energy or appetite.  He denies fevers, chills or other complaints.  Past Medical History:  Diagnosis Date  . COPD (chronic obstructive pulmonary disease) ([place])   . Emphysema of lung ([place])   . Glaucoma   . Hypertension   . Lung cancer [facility])     Patient Active Problem List   Diagnosis Date Noted  . Sepsis ([place]) 12/24/2017  . Protein-calorie malnutrition, severe 11/12/2017  . Acute respiratory failure ([place]) 11/10/2017  . Acute respiratory distress 12/22/2015  . COPD exacerbation ([place]) 12/22/2015  . Acute bronchitis 12/22/2015  . HTN, goal below 140/80 12/22/2015  . Lung cancer, lingula ([place]) 09/23/2015    Past Surgical History:  Procedure Laterality Date  . LUNG BIOPSY    . PERIPHERAL VASCULAR CATHETERIZATION N/A 09/24/2015   Procedure: [name];  Surgeon: Algernon Huxley, MD;  Location: [place] [facility];  Service: Cardiovascular;  Laterality: N/A;  . Port a cath placement      Allergies Patient has no known allergies.  Social History Social History   Tobacco Use  . Smoking status: Current Every Day Smoker    Packs/day: 0.50    Years: 50.00    Pack years: 25.00    Types: Cigarettes  . Smokeless tobacco: Never Used  Substance Use Topics  . Alcohol use: Yes    Alcohol/week: 0.0 standard drinks    Comment: 3 pints/week  . Drug use: No   Review of  Systems Constitutional: Negative for fever. Cardiovascular: Negative for chest pain. Respiratory: Positive for shortness of breath and cough Gastrointestinal: Negative for abdominal pain, vomiting and diarrhea. Musculoskeletal: Negative for back pain. Skin: Negative for rash. Neurological: Positive for weakness  All systems negative/normal/unremarkable except as stated in the HPI  ____________________________________________   PHYSICAL EXAM:  VITAL SIGNS: ED Triage Vitals  Enc Vitals Group     BP      Pulse      Resp      Temp      Temp src      SpO2      Weight      Height      Head Circumference      Peak Flow      Pain Score      Pain Loc      Pain Edu?      Excl. in [place]?    Constitutional: Alert and oriented. Well appearing and in no distress. Eyes: Conjunctivae are normal. Normal extraocular movements. ENT      Head: Normocephalic and atraumatic.      Nose: No congestion/rhinnorhea.      Mouth/Throat: Mucous membranes are moist.      Neck: No stridor. Cardiovascular: Normal rate, regular rhythm. No murmurs, rubs, or gallops. Respiratory: Normal respiratory effort without tachypnea nor retractions. Breath sounds are clear and equal bilaterally. No wheezes/rales/rhonchi. Gastrointestinal: Soft and nontender. Normal bowel sounds Musculoskeletal: Nontender with normal  range of motion in extremities. No lower extremity tenderness nor edema. Neurologic:  Normal speech and language. No gross focal neurologic deficits are appreciated.  Skin:  Skin is warm, dry and intact. No rash noted. Psychiatric: Mood and affect are normal. Speech and behavior are normal.  ____________________________________________  EKG: Interpreted by me.  Sinus tachycardia with a rate of 101 bpm, normal PR interval, normal QRS, normal QT  ____________________________________________  ED COURSE:  As part of my medical decision making, I reviewed the following data within the [facility] History obtained from family if available, nursing notes, old chart and ekg, as well as notes from prior ED visits. Patient presented for dyspnea, we will assess with labs and imaging as indicated at this time.   Procedures  TREYSEN SUDBECK was evaluated in Emergency Department on 10/30/2018 for the symptoms described in the history of present illness. He was evaluated in the context of the global COVID-19 pandemic, which necessitated consideration that the patient might be at risk for infection with the SARS-CoV-2 virus that causes COVID-19. Institutional protocols and algorithms that pertain to the evaluation of patients at risk for COVID-19 are in a state of rapid change based on information released by regulatory bodies including the CDC and federal and state organizations. These policies and algorithms were followed during the patient's care in the ED.  ____________________________________________   LABS (pertinent positives/negatives)  Labs Reviewed  CBC WITH DIFFERENTIAL/PLATELET - Abnormal; Notable for the following components:      Result Value   Hemoglobin 12.6 (*)    HCT 38.4 (*)    All other components within normal limits  SARS CORONAVIRUS 2 (HOSPITAL ORDER, [facility])  BRAIN NATRIURETIC PEPTIDE  COMPREHENSIVE METABOLIC PANEL  TROPONIN I    RADIOLOGY Images were viewed by me  Chest x-ray IMPRESSION: 1. Emphysema. New patchy opacity in the right lung base may be atelectasis or pneumonia. 2. Stable left parahilar scarring. ____________________________________________   DIFFERENTIAL DIAGNOSIS   COPD, CHF, pneumonia, coronavirus  FINAL ASSESSMENT AND PLAN  Dyspnea, CAP   Plan: The patient had presented for dyspnea and cough for the past 3 weeks. Patient's labs were reassuring. Patient's imaging did reveal a new patchy opacity in the right lung base.  We will cover for pneumonia given his history with IV Levaquin.  He will go  home with oral Levaquin.  He is cleared for outpatient follow-up.   Laurence Aly, MD    Note: This note was generated in part or whole with voice recognition software. Voice recognition is usually quite accurate but there are transcription errors that can and very often do occur. I apologize for any typographical errors that were not detected and corrected.     Earleen Newport, MD 10/30/18 2103

## 2018-10-30 NOTE — ED Triage Notes (Signed)
Patient arrives via EMS for increased SOB and cough. Patient has history of lung CA and COPD. Patient reports having some weakness and feeling like he "has no energy or appetite".

## 2019-03-10 ENCOUNTER — Other Ambulatory Visit: Payer: Self-pay

## 2019-03-10 ENCOUNTER — Emergency Department: Payer: Medicare HMO

## 2019-03-10 ENCOUNTER — Emergency Department
Admission: EM | Admit: 2019-03-10 | Discharge: 2019-03-10 | Disposition: A | Discharge status: Home / Self Care | Payer: Medicare HMO | Attending: Emergency Medicine | Admitting: Emergency Medicine

## 2019-03-10 DIAGNOSIS — J449 Chronic obstructive pulmonary disease, unspecified: Secondary | ICD-10-CM | POA: Insufficient documentation

## 2019-03-10 DIAGNOSIS — T69022A Immersion foot, left foot, initial encounter: Secondary | ICD-10-CM | POA: Diagnosis not present

## 2019-03-10 DIAGNOSIS — R600 Localized edema: Secondary | ICD-10-CM | POA: Diagnosis not present

## 2019-03-10 DIAGNOSIS — X31XXXA Exposure to excessive natural cold, initial encounter: Secondary | ICD-10-CM | POA: Diagnosis not present

## 2019-03-10 DIAGNOSIS — L03116 Cellulitis of left lower limb: Secondary | ICD-10-CM | POA: Diagnosis not present

## 2019-03-10 DIAGNOSIS — R61 Generalized hyperhidrosis: Secondary | ICD-10-CM | POA: Diagnosis not present

## 2019-03-10 DIAGNOSIS — F17210 Nicotine dependence, cigarettes, uncomplicated: Secondary | ICD-10-CM | POA: Insufficient documentation

## 2019-03-10 DIAGNOSIS — I10 Essential (primary) hypertension: Secondary | ICD-10-CM | POA: Diagnosis not present

## 2019-03-10 DIAGNOSIS — M7989 Other specified soft tissue disorders: Secondary | ICD-10-CM | POA: Diagnosis present

## 2019-03-10 DIAGNOSIS — Z85118 Personal history of other malignant neoplasm of bronchus and lung: Secondary | ICD-10-CM | POA: Diagnosis not present

## 2019-03-10 DIAGNOSIS — R52 Pain, unspecified: Secondary | ICD-10-CM | POA: Diagnosis not present

## 2019-03-10 DIAGNOSIS — R609 Edema, unspecified: Secondary | ICD-10-CM | POA: Diagnosis not present

## 2019-03-10 LAB — CBC
HCT: 32.5 % — ABNORMAL LOW (ref 39.0–52.0)
Hemoglobin: 10.9 g/dL — ABNORMAL LOW (ref 13.0–17.0)
MCH: 28 pg (ref 26.0–34.0)
MCHC: 33.5 g/dL (ref 30.0–36.0)
MCV: 83.5 fL (ref 80.0–100.0)
Platelets: 180 10*3/uL (ref 150–400)
RBC: 3.89 MIL/uL — ABNORMAL LOW (ref 4.22–5.81)
RDW: 14.7 % (ref 11.5–15.5)
WBC: 3.5 10*3/uL — ABNORMAL LOW (ref 4.0–10.5)
nRBC: 0 % (ref 0.0–0.2)

## 2019-03-10 LAB — TROPONIN I (HIGH SENSITIVITY): Troponin I (High Sensitivity): 3 ng/L (ref <18)

## 2019-03-10 LAB — BASIC METABOLIC PANEL
Anion gap: 10 (ref 5–15)
BUN: 18 mg/dL (ref 8–23)
CO2: 26 mmol/L (ref 22–32)
Calcium: 8.9 mg/dL (ref 8.9–10.3)
Chloride: 102 mmol/L (ref 98–111)
Creatinine, Ser: 0.78 mg/dL (ref 0.61–1.24)
GFR calc Af Amer: >60 mL/min (ref >60)
GFR calc non Af Amer: >60 mL/min (ref >60)
Glucose, Bld: 81 mg/dL (ref 70–99)
Potassium: 3.4 mmol/L — ABNORMAL LOW (ref 3.5–5.1)
Sodium: 138 mmol/L (ref 135–145)

## 2019-03-10 LAB — BRAIN NATRIURETIC PEPTIDE: B Natriuretic Peptide: 53 pg/mL (ref 0.0–100.0)

## 2019-03-10 MED ORDER — DOXYCYCLINE HYCLATE 100 MG PO TABS
100.0000 mg | ORAL_TABLET | Freq: Once | ORAL | Status: AC
  Administered 2019-03-10: 20:00:00 100 mg via ORAL
  Filled 2019-03-10: qty 1

## 2019-03-10 MED ORDER — HEPARIN SOD (PORK) LOCK FLUSH 100 UNIT/ML IV SOLN
INTRAVENOUS | Status: AC
  Administered 2019-03-10: 20:00:00 500 [IU]
  Filled 2019-03-10: qty 5

## 2019-03-10 MED ORDER — DOXYCYCLINE HYCLATE 100 MG PO CAPS: 100.0000 mg | ORAL_CAPSULE | Freq: Two times a day (BID) | ORAL | 0 refills | Status: AC

## 2019-03-10 NOTE — ED Notes (Signed)
Pt foot cleaned with NS and dried. Xeroform applied to top of L foot. AED pad applied and foot wrapped with gauze.

## 2019-03-10 NOTE — ED Notes (Signed)
Pt has bilateral swelling to legs and feet. Pt states this has been gong on for aobut 3-4 days. Pt states he wears long boots and black socks and his foot get hot. Top of pt's left foot has stage 1 skin breakdown with redness, warmth and active maggots on top and between pt's toes. No maggots noted to right foot. Pt states tinging, burning and itching to left foot.

## 2019-03-10 NOTE — ED Notes (Signed)
Called lab and they will add on troponin and BNP

## 2019-03-10 NOTE — ED Notes (Signed)
Port was flushed with heparin and de-accessed. Gauze and bandage applied with pressure.

## 2019-03-10 NOTE — ED Notes (Signed)
Pt's Port at right upper chest accessed at this time. Biopatch, date, time and initials documented on tega-derm. Port flushed and blood return.

## 2019-03-10 NOTE — ED Provider Notes (Signed)
[facility] Emergency Department Provider Note   ____________________________________________   First MD Initiated Contact with Patient 03/10/19 1634     (approximate)  I have reviewed the triage vital signs and the nursing notes.   HISTORY  Chief Complaint Leg Swelling    HPI Carl Bell is a 68 y.o. male with past medical history of hypertension and COPD who presents to the ED complaining of leg pain and swelling.  Patient reports that both of his lower legs and feet have appeared more swollen than usual, but that his left leg has been more swollen than his right.  He has also had increasing pain to the area of his left foot, especially to the top of his foot, where he states it feels like he is being poked by tiny needles.  He states that he did leave a boot on too long to his left foot, spent at least 1 night sleeping with the boot in the same sock on.  When he took it off, he noticed the area was red and swollen, very sensitive to touch.  He has not had any fevers or chills and denies any chest pain, shortness of breath, or cough.        Past Medical History:  Diagnosis Date  . COPD (chronic obstructive pulmonary disease) ([place])   . Emphysema of lung ([place])   . Glaucoma   . Hypertension   . Lung cancer [facility])     Patient Active Problem List   Diagnosis Date Noted  . Sepsis ([place]) 12/24/2017  . Protein-calorie malnutrition, severe 11/12/2017  . Acute respiratory failure ([place]) 11/10/2017  . Acute respiratory distress 12/22/2015  . COPD exacerbation ([place]) 12/22/2015  . Acute bronchitis 12/22/2015  . HTN, goal below 140/80 12/22/2015  . Lung cancer, lingula ([place]) 09/23/2015    Past Surgical History:  Procedure Laterality Date  . LUNG BIOPSY    . PERIPHERAL VASCULAR CATHETERIZATION N/A 09/24/2015   Procedure: [name];  Surgeon: Algernon Huxley, MD;  Location: [facility];  Service: Cardiovascular;  Laterality: N/A;  . Port a  cath placement      Prior to Admission medications   Medication Sig Start Date End Date Taking? Authorizing Provider  albuterol (PROVENTIL HFA;VENTOLIN HFA) 108 (90 Base) MCG/ACT inhaler Inhale 2 puffs into the lungs every 6 (six) hours as needed for wheezing or shortness of breath. Patient not taking: Reported on 05/19/2018 11/12/17   Vaughan Basta, MD  chlorpheniramine-HYDROcodone [facility] PENNKINETIC ER) 10-8 MG/5ML SUER Take 5 mLs by mouth 2 (two) times daily. 10/30/18   Earleen Newport, MD  doxycycline (VIBRAMYCIN) 100 MG capsule Take 1 capsule (100 mg total) by mouth 2 (two) times daily for 7 days. 03/10/19 03/17/19  Blake Divine, MD  feeding supplement, ENSURE ENLIVE, (ENSURE ENLIVE) LIQD Take 237 mLs by mouth 3 (three) times daily between meals. 11/12/17   Vaughan Basta, MD  levofloxacin (LEVAQUIN) 750 MG tablet Take 1 tablet (750 mg total) by mouth daily. 10/30/18   Earleen Newport, MD  Multiple Vitamin (MULTIVITAMIN WITH MINERALS) TABS tablet Take 1 tablet by mouth daily. Patient not taking: Reported on 05/19/2018 11/13/17   Vaughan Basta, MD  predniSONE (DELTASONE) 10 MG tablet Take 1 tablet by mouth as directed. 12/17/17   [provider]  predniSONE (DELTASONE) 10 MG tablet Take 50 mg daily taper by 10 mg daily then continue 10 mg as per your previous routine. 12/24/17   Fritzi Mandes, MD  predniSONE (DELTASONE) 50 MG tablet Take 1 tablet by mouth daily 10/30/18   Earleen Newport, MD  thiamine 100 MG tablet Take 1 tablet (100 mg total) by mouth daily. Patient not taking: Reported on 05/19/2018 11/13/17   Vaughan Basta, MD  TRAVATAN Z 0.004 % SOLN ophthalmic solution Place 1 drop into both eyes at bedtime.  04/01/18   [provider]  umeclidinium-vilanterol (ANORO ELLIPTA) 62.5-25 MCG/INH AEPB Inhale 1 puff into the lungs daily. Patient not taking: Reported on 05/19/2018 12/17/17   Laverle Hobby, MD    Allergies Patient has  no known allergies.  Family History  Problem Relation Age of Onset  . Diabetes Mellitus II Mother   . Hypertension Father     Social History Social History   Tobacco Use  . Smoking status: Current Every Day Smoker    Packs/day: 0.50    Years: 50.00    Pack years: 25.00    Types: Cigarettes  . Smokeless tobacco: Never Used  Substance Use Topics  . Alcohol use: Yes    Alcohol/week: 0.0 standard drinks    Comment: 3 pints/week  . Drug use: No    Review of Systems  Constitutional: No fever/chills Eyes: No visual changes. ENT: No sore throat. Cardiovascular: Denies chest pain. Respiratory: Denies shortness of breath. Gastrointestinal: No abdominal pain.  No nausea, no vomiting.  No diarrhea.  No constipation. Genitourinary: Negative for dysuria. Musculoskeletal: Negative for back pain.  Positive for lower extremity pain and swelling, left greater than right. Skin: Negative for rash. Neurological: Negative for headaches, focal weakness or numbness.  ____________________________________________   PHYSICAL EXAM:  VITAL SIGNS: ED Triage Vitals  Enc Vitals Group     BP 03/10/19 1446 114/67     Pulse Rate 03/10/19 1446 (!) 105     Resp 03/10/19 1446 18     Temp 03/10/19 1446 98.8 F (37.1 C)     Temp Source 03/10/19 1446 Oral     SpO2 03/10/19 1446 100 %     Weight 03/10/19 1446 150 lb (68 kg)     Height 03/10/19 1446 6\' 1"  (1.854 m)     Head Circumference --      Peak Flow --      Pain Score 03/10/19 1459 7     Pain Loc --      Pain Edu? --      Excl. in [place]? --     Constitutional: Alert and oriented. Eyes: Conjunctivae are normal. Head: Atraumatic. Nose: No congestion/rhinnorhea. Mouth/Throat: Mucous membranes are moist. Neck: Normal ROM Cardiovascular: Normal rate, regular rhythm. Grossly normal heart sounds. Respiratory: Normal respiratory effort.  No retractions. Lungs CTAB. Gastrointestinal: Soft and nontender. No distention. Genitourinary: deferred  Musculoskeletal: Trace pitting edema to bilateral lower extremities, with no tenderness to right lower extremity.  Erythema and warmth to dorsum of left foot with associated tenderness, but no focal fluctuance.  Patient noted to have small amount of maggots to the dorsum of left foot and between toes. Neurologic:  Normal speech and language. No gross focal neurologic deficits are appreciated. Skin:  Skin is warm, dry and intact. No rash noted. Psychiatric: Mood and affect are normal. Speech and behavior are normal.  ____________________________________________   LABS (all labs ordered are listed, but only abnormal results are displayed)  Labs Reviewed  BASIC METABOLIC PANEL - Abnormal; Notable for the following components:      Result Value   Potassium 3.4 (*)    All other components within normal  limits  CBC - Abnormal; Notable for the following components:   WBC 3.5 (*)    RBC 3.89 (*)    Hemoglobin 10.9 (*)    HCT 32.5 (*)    All other components within normal limits  BRAIN NATRIURETIC PEPTIDE  TROPONIN I (HIGH SENSITIVITY)   ____________________________________________  EKG  ED ECG REPORT I, Blake Divine, the attending physician, personally viewed and interpreted this ECG.   Date: 03/10/2019  EKG Time: 17:16  Rate: 96  Rhythm: normal sinus rhythm  Axis: Borderline RAD  Intervals:none  ST&T Change: None    PROCEDURES  Procedure(s) performed (including Critical Care):  Procedures   ____________________________________________   INITIAL IMPRESSION / ASSESSMENT AND PLAN / ED COURSE       68 year old male presents to the ED complaining of bilateral lower extremity swelling with increased swelling to left lower extremity and associated pain.  Lower extremities are warm and well perfused with 2+ DP pulses bilaterally.  Given equal swelling bilaterally, low suspicion for DVT.  Work-up for CHF performed and is unremarkable, EKG without acute changes, BNP within  normal limits, troponin negative, and chest x-ray without evidence of fluid overload.  He does have evidence of skin breakdown related to moisture on left foot, likely with superinfection.  He does not appear systemically ill and there are no focal wounds, doubt osteomyelitis given acute onset of symptoms.  Left foot was cleaned and dressed, will start on doxycycline for MRSA coverage.  Counseled patient on close follow-up with his PCP and regularly changing socks.  Also counseled patient to return to the ED for new or worsening symptoms, patient agrees with plan.      ____________________________________________   FINAL CLINICAL IMPRESSION(S) / ED DIAGNOSES  Final diagnoses:  Trench foot of left lower extremity, initial encounter  Cellulitis of left lower extremity     ED Discharge Orders         Ordered    doxycycline (VIBRAMYCIN) 100 MG capsule  2 times daily     03/10/19 1957           Note:  This document was prepared using Dragon voice recognition software and may include unintentional dictation errors.   Blake Divine, MD 03/10/19 1958

## 2019-03-10 NOTE — ED Triage Notes (Signed)
Pt comes into the ED via EMS from home with c/o BL LE edema for the past 3- 4 days with weeping edema. Denies hx of swelling like this , denies CHF

## 2019-03-23 ENCOUNTER — Other Ambulatory Visit: Payer: Self-pay

## 2019-03-23 ENCOUNTER — Emergency Department
Admission: EM | Admit: 2019-03-23 | Discharge: 2019-03-23 | Disposition: A | Discharge status: Home / Self Care | Payer: Medicare HMO | Attending: Emergency Medicine | Admitting: Emergency Medicine

## 2019-03-23 ENCOUNTER — Encounter: Payer: Self-pay | Admitting: Emergency Medicine

## 2019-03-23 ENCOUNTER — Emergency Department: Payer: Medicare HMO

## 2019-03-23 DIAGNOSIS — Z79899 Other long term (current) drug therapy: Secondary | ICD-10-CM | POA: Insufficient documentation

## 2019-03-23 DIAGNOSIS — R2242 Localized swelling, mass and lump, left lower limb: Secondary | ICD-10-CM | POA: Diagnosis present

## 2019-03-23 DIAGNOSIS — R238 Other skin changes: Secondary | ICD-10-CM

## 2019-03-23 DIAGNOSIS — X31XXXD Exposure to excessive natural cold, subsequent encounter: Secondary | ICD-10-CM | POA: Insufficient documentation

## 2019-03-23 DIAGNOSIS — I10 Essential (primary) hypertension: Secondary | ICD-10-CM | POA: Insufficient documentation

## 2019-03-23 DIAGNOSIS — L98491 Non-pressure chronic ulcer of skin of other sites limited to breakdown of skin: Secondary | ICD-10-CM | POA: Diagnosis not present

## 2019-03-23 DIAGNOSIS — J449 Chronic obstructive pulmonary disease, unspecified: Secondary | ICD-10-CM | POA: Diagnosis not present

## 2019-03-23 DIAGNOSIS — F17210 Nicotine dependence, cigarettes, uncomplicated: Secondary | ICD-10-CM | POA: Insufficient documentation

## 2019-03-23 DIAGNOSIS — M7989 Other specified soft tissue disorders: Secondary | ICD-10-CM | POA: Diagnosis not present

## 2019-03-23 DIAGNOSIS — T69022A Immersion foot, left foot, initial encounter: Secondary | ICD-10-CM

## 2019-03-23 LAB — CBC WITH DIFFERENTIAL/PLATELET
Abs Immature Granulocytes: 0.01 10*3/uL (ref 0.00–0.07)
Basophils Absolute: 0 10*3/uL (ref 0.0–0.1)
Basophils Relative: 1 %
Eosinophils Absolute: 0.1 10*3/uL (ref 0.0–0.5)
Eosinophils Relative: 4 %
HCT: 30.1 % — ABNORMAL LOW (ref 39.0–52.0)
Hemoglobin: 10.3 g/dL — ABNORMAL LOW (ref 13.0–17.0)
Immature Granulocytes: 0 %
Lymphocytes Relative: 20 %
Lymphs Abs: 0.5 10*3/uL — ABNORMAL LOW (ref 0.7–4.0)
MCH: 28.1 pg (ref 26.0–34.0)
MCHC: 34.2 g/dL (ref 30.0–36.0)
MCV: 82 fL (ref 80.0–100.0)
Monocytes Absolute: 0.4 10*3/uL (ref 0.1–1.0)
Monocytes Relative: 15 %
Neutro Abs: 1.4 10*3/uL — ABNORMAL LOW (ref 1.7–7.7)
Neutrophils Relative %: 60 %
Platelets: 141 10*3/uL — ABNORMAL LOW (ref 150–400)
RBC: 3.67 MIL/uL — ABNORMAL LOW (ref 4.22–5.81)
RDW: 14.9 % (ref 11.5–15.5)
WBC: 2.4 10*3/uL — ABNORMAL LOW (ref 4.0–10.5)
nRBC: 0 % (ref 0.0–0.2)

## 2019-03-23 LAB — COMPREHENSIVE METABOLIC PANEL
ALT: 17 U/L (ref 0–44)
AST: 36 U/L (ref 15–41)
Albumin: 3.2 g/dL — ABNORMAL LOW (ref 3.5–5.0)
Alkaline Phosphatase: 72 U/L (ref 38–126)
Anion gap: 10 (ref 5–15)
BUN: 14 mg/dL (ref 8–23)
CO2: 27 mmol/L (ref 22–32)
Calcium: 8.8 mg/dL — ABNORMAL LOW (ref 8.9–10.3)
Chloride: 106 mmol/L (ref 98–111)
Creatinine, Ser: 0.92 mg/dL (ref 0.61–1.24)
GFR calc Af Amer: >60 mL/min (ref >60)
GFR calc non Af Amer: >60 mL/min (ref >60)
Glucose, Bld: 81 mg/dL (ref 70–99)
Potassium: 3.8 mmol/L (ref 3.5–5.1)
Sodium: 143 mmol/L (ref 135–145)
Total Bilirubin: 0.9 mg/dL (ref 0.3–1.2)
Total Protein: 6 g/dL — ABNORMAL LOW (ref 6.5–8.1)

## 2019-03-23 NOTE — ED Notes (Signed)
E-signature pad unavailable at this time, discharge instructions given and patient voices understanding

## 2019-03-23 NOTE — ED Provider Notes (Signed)
[facility] Emergency Department Provider Note   ____________________________________________   First MD Initiated Contact with Patient 03/23/19 1709     (approximate)  I have reviewed the triage vital signs and the nursing notes.   HISTORY  Chief Complaint Wound Infection    HPI Carl Bell is a 68 y.o. male with past medical history of hypertension and COPD who presents to the ED for wound check.  Patient was recently seen in the ED approximately 1 week ago for redness and swelling to his left foot.  At that time, evaluation was consistent with trench foot due to patient keeping his foot in a wet sock and likely superimposed infection.  He states he has been regularly changing his socks since then and keeping the area dry, but he is continued to noticed a small amount of moisture and crusting.  He denies any worsening pain or swelling, has not noticed any redness to the skin of his foot.  He also denies any fevers, chills, chest pain, cough, or shortness of breath.  He reports he completed the full course of antibiotics but wanted to get the wound checked out again.        Past Medical History:  Diagnosis Date  . COPD (chronic obstructive pulmonary disease) ([place])   . Emphysema of lung ([place])   . Glaucoma   . Hypertension   . Lung cancer [facility])     Patient Active Problem List   Diagnosis Date Noted  . Sepsis ([place]) 12/24/2017  . Protein-calorie malnutrition, severe 11/12/2017  . Acute respiratory failure ([place]) 11/10/2017  . Acute respiratory distress 12/22/2015  . COPD exacerbation ([place]) 12/22/2015  . Acute bronchitis 12/22/2015  . HTN, goal below 140/80 12/22/2015  . Lung cancer, lingula ([place]) 09/23/2015    Past Surgical History:  Procedure Laterality Date  . LUNG BIOPSY    . PERIPHERAL VASCULAR CATHETERIZATION N/A 09/24/2015   Procedure: [name];  Surgeon: Algernon Huxley, MD;  Location: [place] [facility];  Service:  Cardiovascular;  Laterality: N/A;  . Port a cath placement      Prior to Admission medications   Medication Sig Start Date End Date Taking? Authorizing Provider  albuterol (PROVENTIL HFA;VENTOLIN HFA) 108 (90 Base) MCG/ACT inhaler Inhale 2 puffs into the lungs every 6 (six) hours as needed for wheezing or shortness of breath. Patient not taking: Reported on 05/19/2018 11/12/17   Vaughan Basta, MD  chlorpheniramine-HYDROcodone [facility] PENNKINETIC ER) 10-8 MG/5ML SUER Take 5 mLs by mouth 2 (two) times daily. 10/30/18   Earleen Newport, MD  feeding supplement, ENSURE ENLIVE, (ENSURE ENLIVE) LIQD Take 237 mLs by mouth 3 (three) times daily between meals. 11/12/17   Vaughan Basta, MD  levofloxacin (LEVAQUIN) 750 MG tablet Take 1 tablet (750 mg total) by mouth daily. 10/30/18   Earleen Newport, MD  Multiple Vitamin (MULTIVITAMIN WITH MINERALS) TABS tablet Take 1 tablet by mouth daily. Patient not taking: Reported on 05/19/2018 11/13/17   Vaughan Basta, MD  predniSONE (DELTASONE) 10 MG tablet Take 1 tablet by mouth as directed. 12/17/17   [provider]  predniSONE (DELTASONE) 10 MG tablet Take 50 mg daily taper by 10 mg daily then continue 10 mg as per your previous routine. 12/24/17   Fritzi Mandes, MD  predniSONE (DELTASONE) 50 MG tablet Take 1 tablet by mouth daily 10/30/18   Earleen Newport, MD  thiamine 100 MG tablet Take 1 tablet (100 mg total) by mouth daily. Patient not  taking: Reported on 05/19/2018 11/13/17   Vaughan Basta, MD  TRAVATAN Z 0.004 % SOLN ophthalmic solution Place 1 drop into both eyes at bedtime.  04/01/18   [provider]  umeclidinium-vilanterol (ANORO ELLIPTA) 62.5-25 MCG/INH AEPB Inhale 1 puff into the lungs daily. Patient not taking: Reported on 05/19/2018 12/17/17   Laverle Hobby, MD    Allergies Patient has no known allergies.  Family History  Problem Relation Age of Onset  . Diabetes Mellitus II Mother    . Hypertension Father     Social History Social History   Tobacco Use  . Smoking status: Current Every Day Smoker    Packs/day: 0.50    Years: 50.00    Pack years: 25.00    Types: Cigarettes  . Smokeless tobacco: Never Used  Substance Use Topics  . Alcohol use: Yes    Alcohol/week: 0.0 standard drinks    Comment: 3 pints/week  . Drug use: No    Review of Systems  Constitutional: No fever/chills Eyes: No visual changes. ENT: No sore throat. Cardiovascular: Denies chest pain. Respiratory: Denies shortness of breath. Gastrointestinal: No abdominal pain.  No nausea, no vomiting.  No diarrhea.  No constipation. Genitourinary: Negative for dysuria. Musculoskeletal: Negative for back pain. Skin: Positive for foot drainage. Neurological: Negative for headaches, focal weakness or numbness.  ____________________________________________   PHYSICAL EXAM:  VITAL SIGNS: ED Triage Vitals  Enc Vitals Group     BP 03/23/19 1329 133/78     Pulse Rate 03/23/19 1329 (!) 103     Resp 03/23/19 1329 18     Temp 03/23/19 1329 98.4 F (36.9 C)     Temp src --      SpO2 03/23/19 1329 99 %     Weight 03/23/19 1330 150 lb (68 kg)     Height 03/23/19 1330 6\' 1"  (1.854 m)     Head Circumference --      Peak Flow --      Pain Score 03/23/19 1330 6     Pain Loc --      Pain Edu? --      Excl. in [place]? --     Constitutional: Alert and oriented. Eyes: Conjunctivae are normal. Head: Atraumatic. Nose: No congestion/rhinnorhea. Mouth/Throat: Mucous membranes are moist. Neck: Normal ROM Cardiovascular: Normal rate, regular rhythm. Grossly normal heart sounds. Respiratory: Normal respiratory effort.  No retractions. Lungs CTAB. Gastrointestinal: Soft and nontender. No distention. Genitourinary: deferred Musculoskeletal: No lower extremity tenderness nor edema.  2+ DP pulses bilaterally. Neurologic:  Normal speech and language. No gross focal neurologic deficits are appreciated. Skin:   Skin is warm, dry and intact.  Approximately 2 cm area of irritated skin with scant serous drainage on dorsum of left foot.  No associated erythema, warmth, tenderness, or fluctuance. Psychiatric: Mood and affect are normal. Speech and behavior are normal.  ____________________________________________   LABS (all labs ordered are listed, but only abnormal results are displayed)  Labs Reviewed  CBC WITH DIFFERENTIAL/PLATELET - Abnormal; Notable for the following components:      Result Value   WBC 2.4 (*)    RBC 3.67 (*)    Hemoglobin 10.3 (*)    HCT 30.1 (*)    Platelets 141 (*)    Neutro Abs 1.4 (*)    Lymphs Abs 0.5 (*)    All other components within normal limits  COMPREHENSIVE METABOLIC PANEL - Abnormal; Notable for the following components:   Calcium 8.8 (*)    Total Protein 6.0 (*)  Albumin 3.2 (*)    All other components within normal limits     PROCEDURES  Procedure(s) performed (including Critical Care):  Procedures   ____________________________________________   INITIAL IMPRESSION / ASSESSMENT AND PLAN / ED COURSE       68 year old male presents to the ED for recheck of wound to left foot after he recently completed course of doxycycline.  Wound no longer appears infected as there is no erythema, warmth, tenderness, or fluctuance.  Wound actually appears much improved overall following my evaluation of the patient during his prior ED visit.  Area of skin breakdown secondary to moisture injury is much improved, now only affecting a small portion of the dorsum of his left foot.  He does not have any systemic signs of illness, does have chronic leukopenia, for which he may follow-up with his PCP.  Would hold off on further antibiotics for now, dressing applied in the ED and counseled patient to follow-up with his PCP.  Patient agrees with plan.      ____________________________________________   FINAL CLINICAL IMPRESSION(S) / ED DIAGNOSES  Final  diagnoses:  Trench foot of left lower extremity, initial encounter  Skin breakdown     ED Discharge Orders    None       Note:  This document was prepared using Dragon voice recognition software and may include unintentional dictation errors.   Blake Divine, MD 03/23/19 2131

## 2019-03-23 NOTE — ED Triage Notes (Signed)
Pt states was seen approximately 1 week ago and given abx for foot infection and told to return if not better.  Pt states they are not much better and is returning for re-evaluation. Pt states left foot is worse than the right.

## 2019-05-20 ENCOUNTER — Ambulatory Visit
Admission: RE | Admit: 2019-05-20 | Discharge: 2019-05-20 | Disposition: A | Discharge status: Home / Self Care | Payer: Medicare HMO | Source: Ambulatory Visit | Attending: Oncology | Admitting: Oncology

## 2019-05-20 ENCOUNTER — Other Ambulatory Visit: Payer: Self-pay

## 2019-05-20 DIAGNOSIS — C3410 Malignant neoplasm of upper lobe, unspecified bronchus or lung: Secondary | ICD-10-CM | POA: Insufficient documentation

## 2019-05-20 DIAGNOSIS — J439 Emphysema, unspecified: Secondary | ICD-10-CM | POA: Insufficient documentation

## 2019-05-20 DIAGNOSIS — C3490 Malignant neoplasm of unspecified part of unspecified bronchus or lung: Secondary | ICD-10-CM | POA: Diagnosis not present

## 2019-05-20 LAB — POCT I-STAT CREATININE: Creatinine, Ser: 0.8 mg/dL (ref 0.61–1.24)

## 2019-05-20 MED ORDER — IOHEXOL 300 MG/ML  SOLN
75.0000 mL | Freq: Once | INTRAMUSCULAR | Status: AC | PRN
  Administered 2019-05-20: 75 mL via INTRAVENOUS

## 2019-05-23 ENCOUNTER — Inpatient Hospital Stay: Payer: Medicare HMO | Admitting: Oncology

## 2019-05-23 ENCOUNTER — Inpatient Hospital Stay: Payer: Medicare HMO

## 2019-05-25 ENCOUNTER — Ambulatory Visit: Payer: Medicare HMO | Admitting: Radiation Oncology

## 2019-06-19 NOTE — Progress Notes (Signed)
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  INTERVAL HISTORY: Patient returns to clinic today for routine yearly evaluation and discussion of his imaging results.  He is having problems with his left foot and has been to the emergency room twice for evaluation.  He otherwise feels well and is asymptomatic. He has no neurologic complaints.  He denies any recent fevers or illnesses.  He has a good appetite and denies weight loss.  He has no chest pain, shortness of breath, cough, or hemoptysis. He denies any nausea, vomiting, constipation, or diarrhea. He has no urinary complaints.  Patient offers no further specific complaints today.  REVIEW OF SYSTEMS:   Review of Systems  Constitutional: Negative.  Negative for fever, malaise/fatigue and weight loss.  Respiratory: Negative.  Negative for cough, hemoptysis and shortness of breath.   Cardiovascular: Negative.  Negative for chest pain and leg swelling.  Gastrointestinal: Negative.  Negative for abdominal pain, blood in stool and melena.  Genitourinary: Negative.  Negative for dysuria.  Musculoskeletal: Negative.  Negative for back pain.  Skin: Negative.  Negative for rash.  Neurological: Negative.  Negative for sensory change, focal weakness and weakness.  Endo/Heme/Allergies: Does not bruise/bleed easily.  Psychiatric/Behavioral: Negative.  The patient is not nervous/anxious.     As per HPI. Otherwise, a complete review of systems is negative.  PAST MEDICAL HISTORY: Past Medical History:  Diagnosis Date  . COPD (chronic obstructive pulmonary disease) ([place])   . Emphysema of lung ([place])   . Glaucoma   . Hypertension   . Lung cancer  ([place])     PAST SURGICAL HISTORY: Past Surgical History:  Procedure Laterality Date  . LUNG BIOPSY    . PERIPHERAL VASCULAR CATHETERIZATION N/A 09/24/2015   Procedure: [name];  Surgeon: Algernon Huxley, MD;  Location: [facility];  Service: Cardiovascular;  Laterality: N/A;  . Port a cath placement      FAMILY HISTORY: Reviewed and unchanged. No reported history of malignancy or chronic disease.     ADVANCED DIRECTIVES:    HEALTH MAINTENANCE: Social History   Tobacco Use  . Smoking status: Current Every Day Smoker    Packs/day: 0.50    Years: 50.00    Pack years: 25.00    Types: Cigarettes  . Smokeless tobacco: Never Used  Substance Use Topics  . Alcohol use: Yes    Alcohol/week: 0.0 standard drinks    Comment: 3 pints/week  . Drug use: No     No Known Allergies  Current Outpatient Medications  Medication Sig Dispense Refill  . albuterol (PROVENTIL HFA;VENTOLIN HFA) 108 (90 Base) MCG/ACT inhaler Inhale 2 puffs into the lungs every 6 (six) hours as needed for wheezing or shortness of breath. 1 Inhaler 3  . chlorpheniramine-HYDROcodone (TUSSIONEX PENNKINETIC ER) 10-8 MG/5ML SUER Take 5 mLs by mouth 2 (two) times daily. 140 mL 0  . feeding supplement, ENSURE ENLIVE, (ENSURE ENLIVE) LIQD Take 237 mLs by mouth 3 (three) times daily between meals. 237 mL 12  . levofloxacin (LEVAQUIN) 750 MG tablet Take 1 tablet (750 mg total) by mouth daily. 5 tablet 0  . Multiple Vitamin (MULTIVITAMIN WITH MINERALS) TABS tablet Take 1 tablet by mouth daily. 30 tablet  0  . predniSONE (DELTASONE) 10 MG tablet Take 1 tablet by mouth as directed.    . predniSONE (DELTASONE) 10 MG tablet Take 50 mg daily taper by 10 mg daily then continue 10 mg as per your previous routine. 15 tablet 0  . predniSONE (DELTASONE) 50 MG tablet Take 1 tablet by mouth daily 4 tablet 0  . thiamine 100 MG tablet Take 1 tablet (100 mg total) by mouth daily. 30 tablet 0  . TRAVATAN Z 0.004 % SOLN  ophthalmic solution Place 1 drop into both eyes at bedtime.     Marland Kitchen umeclidinium-vilanterol (ANORO ELLIPTA) 62.5-25 MCG/INH AEPB Inhale 1 puff into the lungs daily. 1 each 10   No current facility-administered medications for this visit.    OBJECTIVE: Vitals:   06/22/19 0959  BP: 120/89  Pulse: (!) 109  Resp: 18  Temp: (!) 97 F (36.1 C)  SpO2: 95%     Body mass index is 18.79 kg/m.    ECOG FS:0 - Asymptomatic  General: Well-developed, well-nourished, no acute distress. Eyes: Pink conjunctiva, anicteric sclera. HEENT: Normocephalic, moist mucous membranes. Lungs: No audible wheezing or coughing. Heart: Tachycardic. Abdomen: Soft, nontender, nondistended. No organomegaly noted, normoactive bowel sounds. Musculoskeletal: Left foot with mild erythema, woody edema, and unpalpable pulses.  Cold to touch. Neuro: Alert, answering all questions appropriately. Cranial nerves grossly intact. Skin: No rashes or petechiae noted. Psych: Normal affect.  LAB RESULTS:  Lab Results  Component Value Date   NA 143 03/23/2019   K 3.8 03/23/2019   CL 106 03/23/2019   CO2 27 03/23/2019   GLUCOSE 81 03/23/2019   BUN 14 03/23/2019   CREATININE 0.80 05/20/2019   CALCIUM 8.8 (L) 03/23/2019   PROT 6.0 (L) 03/23/2019   ALBUMIN 3.2 (L) 03/23/2019   AST 36 03/23/2019   ALT 17 03/23/2019   ALKPHOS 72 03/23/2019   BILITOT 0.9 03/23/2019   GFRNONAA >60 03/23/2019   GFRAA >60 03/23/2019    Lab Results  Component Value Date   WBC 2.4 (L) 03/23/2019   NEUTROABS 1.4 (L) 03/23/2019   HGB 10.3 (L) 03/23/2019   HCT 30.1 (L) 03/23/2019   MCV 82.0 03/23/2019   PLT 141 (L) 03/23/2019     STUDIES: No results found.  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  PLAN:    1. 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 Patient only received one infusion of consolidation chemotherapy on December 27, 2015,  but this was discontinued secondary to persistent pancytopenia.  Patient's most recent CT scan on May 20, 2019 reviewed independently with no obvious evidence of recurrent or progressive disease.  Continue yearly imaging.  Return to clinic in 1 year after his CT scan for discussion of the results and further evaluation.   2.  Cough: Patient does not complain of this today. 3.  Left foot: Given physical exam as above, patient appears to have minimal circulation in his left foot.  Patient denies pain, but it is cold to touch and pulses are unpalpable.  Patient was given an urgent referral to vascular surgery. 4.  Leukopenia: Chronic and unchanged.  Patient's most recent white blood cell count in September 2020 was reported 2.4. 5.  Anemia: Mild, monitor.  Patient expressed understanding and was in agreement with this plan. He also understands that He can call clinic at any time with any questions, concerns, or complaints.   Lloyd Huger,  MD 06/23/19 7:01 AM

## 2019-06-21 ENCOUNTER — Other Ambulatory Visit: Payer: Self-pay

## 2019-06-21 NOTE — Progress Notes (Signed)
Patient pre screened for office appointment, no questions or concerns today. Patient reminded of upcoming appointment time and date. 

## 2019-06-22 ENCOUNTER — Other Ambulatory Visit: Payer: Self-pay | Admitting: *Deleted

## 2019-06-22 ENCOUNTER — Other Ambulatory Visit: Payer: Self-pay

## 2019-06-22 ENCOUNTER — Inpatient Hospital Stay: Payer: Medicare HMO | Attending: Oncology | Admitting: Oncology

## 2019-06-22 ENCOUNTER — Ambulatory Visit
Admission: RE | Admit: 2019-06-22 | Discharge: 2019-06-22 | Disposition: A | Discharge status: Home / Self Care | Payer: Medicare HMO | Source: Ambulatory Visit | Attending: Radiation Oncology | Admitting: Radiation Oncology

## 2019-06-22 ENCOUNTER — Encounter: Payer: Self-pay | Admitting: Oncology

## 2019-06-22 VITALS — BP 120/89 | HR 109 | Temp 97.0°F | Resp 18 | Wt 142.4 lb

## 2019-06-22 DIAGNOSIS — Z85118 Personal history of other malignant neoplasm of bronchus and lung: Secondary | ICD-10-CM | POA: Insufficient documentation

## 2019-06-22 DIAGNOSIS — C3410 Malignant neoplasm of upper lobe, unspecified bronchus or lung: Secondary | ICD-10-CM

## 2019-06-22 DIAGNOSIS — R05 Cough: Secondary | ICD-10-CM | POA: Insufficient documentation

## 2019-06-22 DIAGNOSIS — D61818 Other pancytopenia: Secondary | ICD-10-CM | POA: Diagnosis not present

## 2019-06-22 DIAGNOSIS — Z923 Personal history of irradiation: Secondary | ICD-10-CM | POA: Diagnosis not present

## 2019-06-22 DIAGNOSIS — C3412 Malignant neoplasm of upper lobe, left bronchus or lung: Secondary | ICD-10-CM

## 2019-06-22 DIAGNOSIS — F17210 Nicotine dependence, cigarettes, uncomplicated: Secondary | ICD-10-CM | POA: Insufficient documentation

## 2019-06-22 DIAGNOSIS — I10 Essential (primary) hypertension: Secondary | ICD-10-CM | POA: Insufficient documentation

## 2019-06-22 DIAGNOSIS — L819 Disorder of pigmentation, unspecified: Secondary | ICD-10-CM

## 2019-06-22 DIAGNOSIS — Z79899 Other long term (current) drug therapy: Secondary | ICD-10-CM | POA: Insufficient documentation

## 2019-06-22 DIAGNOSIS — R0989 Other specified symptoms and signs involving the circulatory and respiratory systems: Secondary | ICD-10-CM

## 2019-06-22 DIAGNOSIS — H409 Unspecified glaucoma: Secondary | ICD-10-CM | POA: Diagnosis not present

## 2019-06-22 DIAGNOSIS — J449 Chronic obstructive pulmonary disease, unspecified: Secondary | ICD-10-CM | POA: Diagnosis not present

## 2019-06-22 NOTE — Progress Notes (Signed)
Radiation Oncology Follow up Note  Name: Carl Bell   Date:   06/22/2019 MRN:  [id number] DOB: [birth year]    This 68 y.o. 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  Seen today in routine follow-up he is doing fairly well.  He has a mild nonproductive cough no dysphagia.  He had a recent CT scan.  Which shows chest to be stable he has emphysema with scattered areas of architectural distortion.  He has a stable 3 mm post left upper lobe pulmonary nodule thought to be of benign etiology.  COMPLICATIONS OF TREATMENT: none  FOLLOW UP COMPLIANCE: keeps appointments   PHYSICAL EXAM:  There were no vitals taken for this visit. Wheelchair-bound male in NAD has a boot cast on his left foot.  Well-developed well-nourished patient in NAD. HEENT reveals PERLA, EOMI, discs not visualized.  Oral cavity is clear. No oral mucosal lesions are identified. Neck is clear without evidence of cervical or supraclavicular adenopathy. Lungs are clear to A&P. Cardiac examination is essentially unremarkable with regular rate and rhythm without murmur rub or thrill. Abdomen is benign with no organomegaly or masses noted. Motor sensory and DTR levels are equal and symmetric in the upper and lower extremities. Cranial nerves II through XII are grossly intact. Proprioception is intact. No peripheral adenopathy or edema is identified. No motor or sensory levels are noted. Crude visual fields are within normal range.  RADIOLOGY RESULTS: CT scan reviewed compatible with above-stated findings  PLAN: Present time patient continues to do well with no evidence of disease.  I am pleased with his overall  progress.  Based on his abilities for ambulation and transportation needs I am going to turn follow-up care over to medical oncology.  I would be happy to reevaluate the patient anytime should further treatment be indicated.  I would like to take this opportunity to thank you for allowing me to participate in the care of your patient.Noreene Filbert, MD

## 2019-06-22 NOTE — Progress Notes (Signed)
Patient is here for a follow-up day with a complaint that he thinks his left foot is infected.

## 2019-06-28 ENCOUNTER — Telehealth (INDEPENDENT_AMBULATORY_CARE_PROVIDER_SITE_OTHER): Payer: Self-pay

## 2019-06-28 ENCOUNTER — Encounter (INDEPENDENT_AMBULATORY_CARE_PROVIDER_SITE_OTHER): Payer: Self-pay

## 2019-06-28 ENCOUNTER — Other Ambulatory Visit: Payer: Self-pay

## 2019-06-28 ENCOUNTER — Encounter (INDEPENDENT_AMBULATORY_CARE_PROVIDER_SITE_OTHER): Payer: Self-pay | Admitting: Vascular Surgery

## 2019-06-28 ENCOUNTER — Ambulatory Visit (INDEPENDENT_AMBULATORY_CARE_PROVIDER_SITE_OTHER): Payer: Medicare HMO | Admitting: Vascular Surgery

## 2019-06-28 VITALS — BP 150/80 | HR 121 | Resp 17 | Wt 138.6 lb

## 2019-06-28 DIAGNOSIS — C3410 Malignant neoplasm of upper lobe, unspecified bronchus or lung: Secondary | ICD-10-CM

## 2019-06-28 DIAGNOSIS — I7025 Atherosclerosis of native arteries of other extremities with ulceration: Secondary | ICD-10-CM

## 2019-06-28 DIAGNOSIS — J449 Chronic obstructive pulmonary disease, unspecified: Secondary | ICD-10-CM | POA: Diagnosis not present

## 2019-06-28 DIAGNOSIS — I10 Essential (primary) hypertension: Secondary | ICD-10-CM

## 2019-06-28 NOTE — Assessment & Plan Note (Signed)
Treatment was back in 2017.  I apparently put his port in almost 4 years ago.  No evidence of disease on his recent study and he follows up with oncology yearly

## 2019-06-28 NOTE — Patient Instructions (Signed)
Peripheral Vascular Disease  Peripheral vascular disease (PVD) is a disease of the blood vessels that are not part of your heart and brain. A simple term for PVD is poor circulation. In most cases, PVD narrows the blood vessels that carry blood from your heart to the rest of your body. This can reduce the supply of blood to your arms, legs, and internal organs, like your stomach or kidneys. However, PVD most often affects a person's lower legs and feet. Without treatment, PVD tends to get worse. PVD can also lead to acute ischemic limb. This is when an arm or leg suddenly cannot get enough blood. This is a medical emergency. Follow these instructions at home: Lifestyle  Do not use any products that contain nicotine or tobacco, such as cigarettes and e-cigarettes. If you need help quitting, ask your doctor.  Lose weight if you are overweight. Or, stay at a healthy weight as told by your doctor.  Eat a diet that is low in fat and cholesterol. If you need help, ask your doctor.  Exercise regularly. Ask your doctor for activities that are right for you. General instructions  Take over-the-counter and prescription medicines only as told by your doctor.  Take good care of your feet: ? Wear comfortable shoes that fit well. ? Check your feet often for any cuts or sores.  Keep all follow-up visits as told by your doctor This is important. Contact a doctor if:  You have cramps in your legs when you walk.  You have leg pain when you are at rest.  You have coldness in a leg or foot.  Your skin changes.  You are unable to get or have an erection (erectile dysfunction).  You have cuts or sores on your feet that do not heal. Get help right away if:  Your arm or leg turns cold, numb, and blue.  Your arms or legs become red, warm, swollen, painful, or numb.  You have chest pain.  You have trouble breathing.  You suddenly have weakness in your face, arm, or leg.  You become very  confused or you cannot speak.  You suddenly have a very bad headache.  You suddenly cannot see. Summary  Peripheral vascular disease (PVD) is a disease of the blood vessels.  A simple term for PVD is poor circulation. Without treatment, PVD tends to get worse.  Treatment may include exercise, low fat and low cholesterol diet, and quitting smoking. This information is not intended to replace advice given to you by your health care provider. Make sure you discuss any questions you have with your health care provider. Document Released: 09/24/2009 Document Revised: 06/12/2017 Document Reviewed: 08/07/2016 Elsevier Patient Education  2020 Elsevier Inc.  

## 2019-06-28 NOTE — Progress Notes (Signed)
Patient ID: Carl Bell, male   DOB: 1951-06-03, 68 y.o.   MRN: [id number]  Chief Complaint  Patient presents with  . New Patient (Initial Visit)    ref Grayland Ormond absent pulses,discoloration    HPI Carl Bell is a 68 y.o. male.  I am asked to see the patient by Dr. Grayland Ormond for evaluation of left foot ulceration, pain, and no palpable pedal pulses concerning for arterial insufficiency.  Patient has been dealing with draining and ulceration from his left foot for for several months.  He was originally diagnosed with trench foot in the ER.  He had some similar issues on the right leg although not as severe that have gone on to heal.  The left leg drains constantly.  The foot has weeping from the toes and the top of the foot.  This is very painful.  No fevers or chills.  He has multiple medical comorbidities including ongoing tobacco use and consideration for peripheral arterial disease is quite high.  No fevers or chills.  Nothing he has done has really made the foot any better.     Past Medical History:  Diagnosis Date  . COPD (chronic obstructive pulmonary disease) ([place])   . Emphysema of lung ([place])   . Glaucoma   . Hypertension   . Lung cancer [facility])     Past Surgical History:  Procedure Laterality Date  . LUNG BIOPSY    . PERIPHERAL VASCULAR CATHETERIZATION N/A 09/24/2015   Procedure: [name];  Surgeon: Algernon Huxley, MD;  Location: [facility];  Service: Cardiovascular;  Laterality: N/A;  . Port a cath placement       Family History  Problem Relation Age of Onset  . Diabetes Mellitus II Mother   . Hypertension Father   No bleeding or clotting disorders.  No aneurysms   Social History   Tobacco Use  . Smoking status: Current Every Day Smoker    Packs/day: 0.50    Years: 50.00    Pack years: 25.00    Types: Cigarettes  . Smokeless tobacco: Never Used  Substance Use Topics  . Alcohol use: Yes    Alcohol/week: 0.0 standard drinks    Comment: 3  pints/week  . Drug use: No     No Known Allergies  Current Outpatient Medications  Medication Sig Dispense Refill  . albuterol (PROVENTIL HFA;VENTOLIN HFA) 108 (90 Base) MCG/ACT inhaler Inhale 2 puffs into the lungs every 6 (six) hours as needed for wheezing or shortness of breath. 1 Inhaler 3  . chlorpheniramine-HYDROcodone (TUSSIONEX PENNKINETIC ER) 10-8 MG/5ML SUER Take 5 mLs by mouth 2 (two) times daily. 140 mL 0  . feeding supplement, ENSURE ENLIVE, (ENSURE ENLIVE) LIQD Take 237 mLs by mouth 3 (three) times daily between meals. 237 mL 12  . Multiple Vitamin (MULTIVITAMIN WITH MINERALS) TABS tablet Take 1 tablet by mouth daily. 30 tablet 0  . thiamine 100 MG tablet Take 1 tablet (100 mg total) by mouth daily. 30 tablet 0  . TRAVATAN Z 0.004 % SOLN ophthalmic solution Place 1 drop into both eyes at bedtime.     Marland Kitchen levofloxacin (LEVAQUIN) 750 MG tablet Take 1 tablet (750 mg total) by mouth daily. (Patient not taking: Reported on 06/28/2019) 5 tablet 0  . predniSONE (DELTASONE) 10 MG tablet Take 1 tablet by mouth as directed.    . predniSONE (DELTASONE) 10 MG tablet Take 50 mg daily taper by 10 mg daily then continue 10 mg as  per your previous routine. (Patient not taking: Reported on 06/28/2019) 15 tablet 0  . predniSONE (DELTASONE) 50 MG tablet Take 1 tablet by mouth daily (Patient not taking: Reported on 06/28/2019) 4 tablet 0  . umeclidinium-vilanterol (ANORO ELLIPTA) 62.5-25 MCG/INH AEPB Inhale 1 puff into the lungs daily. (Patient not taking: Reported on 06/28/2019) 1 each 10   No current facility-administered medications for this visit.      REVIEW OF SYSTEMS (Negative unless checked)  Constitutional: [] Weight loss  [] Fever  [] Chills Cardiac: [] Chest pain   [] Chest pressure   [] Palpitations   [] Shortness of breath when laying flat   [] Shortness of breath at rest   [] Shortness of breath with exertion. Vascular:  [] Pain in legs with walking   [] Pain in legs at rest   [] Pain in legs  when laying flat   [] Claudication   [] Pain in feet when walking  [] Pain in feet at rest  [] Pain in feet when laying flat   [] History of DVT   [] Phlebitis   [x] Swelling in legs   [] Varicose veins   [x] Non-healing ulcers Pulmonary:   [] Uses home oxygen   [] Productive cough   [] Hemoptysis   [] Wheeze  [x] COPD   [] Asthma Neurologic:  [] Dizziness  [] Blackouts   [] Seizures   [] History of stroke   [] History of TIA  [] Aphasia   [] Temporary blindness   [] Dysphagia   [] Weakness or numbness in arms   [] Weakness or numbness in legs Musculoskeletal:  [x] Arthritis   [] Joint swelling   [x] Joint pain   [] Low back pain Hematologic:  [] Easy bruising  [] Easy bleeding   [] Hypercoagulable state   [] Anemic  [] Hepatitis Gastrointestinal:  [] Blood in stool   [] Vomiting blood  [] Gastroesophageal reflux/heartburn   [] Abdominal pain Genitourinary:  [] Chronic kidney disease   [] Difficult urination  [] Frequent urination  [] Burning with urination   [] Hematuria Skin:  [] Rashes   [x] Ulcers   [x] Wounds Psychological:  [] History of anxiety   []  History of major depression.    Physical Exam BP (!) 150/80 (BP Location: Right Arm)   Pulse (!) 121   Resp 17   Wt 138 lb 9.6 oz (62.9 kg)   BMI 18.29 kg/m  Gen:  WD/WN, NAD Head: Stem/AT, No temporalis wasting.  Ear/Nose/Throat: Hearing grossly intact, nares w/o erythema or drainage, oropharynx w/o Erythema/Exudate Eyes: Conjunctiva clear, sclera non-icteric  Neck: trachea midline.  No JVD.  Pulmonary:  Good air movement, respirations not labored, no use of accessory muscles  Cardiac: RRR, no JVD Vascular:  Vessel Right Left  Radial Palpable Palpable                          DP  1+  not palpable  PT Trace Not palpable   Gastrointestinal:. No masses, surgical incisions, or scars. Musculoskeletal: M/S 5/5 throughout.  Mild lower extremity edema bilaterally.  Left foot with clear serous fluid weeping from the toes and the top of the foot.  There are superficial ulcerations  on multiple toes and the forefoot has the appearance of a midlevel burn with skin that is marginal at best.  The foot is cool and somewhat painful to the touch.  Capillary refill is very sluggish worse on the left than the right Neurologic: Sensation grossly intact in extremities.  Symmetrical.  Speech is fluent. Motor exam as listed above. Psychiatric: Judgment intact, Mood & affect appropriate for pt's clinical situation. Dermatologic: Skin of the left foot as above    Radiology No results found.  Labs Recent Results (from the past 2160 hour(s))  I-STAT creatinine     Status: None   Collection Time: 05/20/19  8:42 AM  Result Value Ref Range   Creatinine, Ser 0.80 0.61 - 1.24 mg/dL    Assessment/Plan:  HTN, goal below 140/80 blood pressure control important in reducing the progression of atherosclerotic disease. On appropriate oral medications.   Lung cancer, lingula ([place]) Treatment was back in 2017.  I apparently put his port in almost 4 years ago.  No evidence of disease on his recent study and he follows up with oncology yearly  COPD (chronic obstructive pulmonary disease) ([place]) Continue pulmonary medications and aerosols as already ordered, these medications have been reviewed and there are no changes at this time.    Atherosclerosis of native arteries of the extremities with ulceration ([place]) The patient has evidence of poor perfusion of the left foot and extensive skin breakdown which is very concerning and at high risk of limb loss.  The skin breakdown pattern includes most of the top of the foot which if there is nonhealing ulceration there, limb salvage is very difficult to obtain.  We discussed the reason and rationale for angiogram and the need for improved perfusion to try to give the leg chance of wound healing.  I discussed that even with revascularization, limb loss is still a possibility.  I discussed the grave nature of the situation and why prompt treatment should  be performed.  I discussed the risks and benefits of angiography and possible revascularization.  The patient is agreeable to proceed.      Leotis Pain 06/28/2019, 3:14 PM   This note was created with Dragon medical transcription system.  Any errors from dictation are unintentional.

## 2019-06-28 NOTE — Assessment & Plan Note (Signed)
Continue pulmonary medications and aerosols as already ordered, these medications have been reviewed and there are no changes at this time.   

## 2019-06-28 NOTE — Telephone Encounter (Signed)
Patient was seen in the office today and left before I could give him his information regarding his procedure with Dr. Lucky Cowboy on 07/04/2019 with a 10:15 am arrival time to the [facility]. Patient will do covid testing on 06/30/2019 between 12:30-2:30 pm at the [place]. I attempted to contact the patient and was unable to get through.

## 2019-06-28 NOTE — Assessment & Plan Note (Signed)
blood pressure control important in reducing the progression of atherosclerotic disease. On appropriate oral medications.  

## 2019-06-28 NOTE — Assessment & Plan Note (Signed)
The patient has evidence of poor perfusion of the left foot and extensive skin breakdown which is very concerning and at high risk of limb loss.  The skin breakdown pattern includes most of the top of the foot which if there is nonhealing ulceration there, limb salvage is very difficult to obtain.  We discussed the reason and rationale for angiogram and the need for improved perfusion to try to give the leg chance of wound healing.  I discussed that even with revascularization, limb loss is still a possibility.  I discussed the grave nature of the situation and why prompt treatment should be performed.  I discussed the risks and benefits of angiography and possible revascularization.  The patient is agreeable to proceed.

## 2019-06-30 ENCOUNTER — Other Ambulatory Visit: Admission: RE | Admit: 2019-06-30 | Payer: Medicare HMO | Source: Ambulatory Visit

## 2019-07-01 NOTE — Telephone Encounter (Signed)
I have attempted to contact the patient twice this morning regarding his angio on 07/04/2019 with Dr. Lucky Cowboy. I am unable to leave a message.

## 2019-07-04 ENCOUNTER — Telehealth (INDEPENDENT_AMBULATORY_CARE_PROVIDER_SITE_OTHER): Payer: Self-pay

## 2019-07-04 ENCOUNTER — Other Ambulatory Visit (INDEPENDENT_AMBULATORY_CARE_PROVIDER_SITE_OTHER): Payer: Self-pay | Admitting: Nurse Practitioner

## 2019-07-04 ENCOUNTER — Ambulatory Visit: Payer: Medicare HMO | Admitting: Family Medicine

## 2019-07-04 NOTE — Telephone Encounter (Signed)
Spoke with the patient and he is now scheduled for angio on 07/11/2019 with a 9:15 am arrival time to the [facility]. Patient will do covid testing on 07/07/2019 before 12:00 pm at the [facility]. I attempted to discuss his procedure/ instructions on 07/11/2019 and patient kept telling me lets just worry about the first appt instead. I will mail this information to the patient.

## 2019-07-07 ENCOUNTER — Other Ambulatory Visit
Admission: RE | Admit: 2019-07-07 | Discharge: 2019-07-07 | Disposition: A | Discharge status: Home / Self Care | Payer: Medicare HMO | Source: Ambulatory Visit | Attending: Vascular Surgery | Admitting: Vascular Surgery

## 2019-07-07 DIAGNOSIS — Z20828 Contact with and (suspected) exposure to other viral communicable diseases: Secondary | ICD-10-CM | POA: Diagnosis not present

## 2019-07-07 DIAGNOSIS — Z01812 Encounter for preprocedural laboratory examination: Secondary | ICD-10-CM | POA: Diagnosis not present

## 2019-07-08 LAB — SARS CORONAVIRUS 2 (TAT 6-24 HRS): SARS Coronavirus 2: NEGATIVE

## 2019-07-11 ENCOUNTER — Encounter
Admission: RE | Disposition: A | Discharge status: Home / Self Care | Payer: Self-pay | Source: Home / Self Care | Attending: Vascular Surgery

## 2019-07-11 ENCOUNTER — Ambulatory Visit
Admission: RE | Admit: 2019-07-11 | Discharge: 2019-07-11 | Disposition: A | Discharge status: Home / Self Care | Payer: Medicare HMO | Attending: Vascular Surgery | Admitting: Vascular Surgery

## 2019-07-11 ENCOUNTER — Encounter: Payer: Self-pay | Admitting: Cardiology

## 2019-07-11 DIAGNOSIS — I739 Peripheral vascular disease, unspecified: Secondary | ICD-10-CM

## 2019-07-11 DIAGNOSIS — L97521 Non-pressure chronic ulcer of other part of left foot limited to breakdown of skin: Secondary | ICD-10-CM | POA: Diagnosis not present

## 2019-07-11 DIAGNOSIS — I70245 Atherosclerosis of native arteries of left leg with ulceration of other part of foot: Secondary | ICD-10-CM | POA: Diagnosis not present

## 2019-07-11 DIAGNOSIS — Z79899 Other long term (current) drug therapy: Secondary | ICD-10-CM | POA: Diagnosis not present

## 2019-07-11 DIAGNOSIS — Z85118 Personal history of other malignant neoplasm of bronchus and lung: Secondary | ICD-10-CM | POA: Insufficient documentation

## 2019-07-11 DIAGNOSIS — J449 Chronic obstructive pulmonary disease, unspecified: Secondary | ICD-10-CM | POA: Diagnosis not present

## 2019-07-11 DIAGNOSIS — I10 Essential (primary) hypertension: Secondary | ICD-10-CM | POA: Insufficient documentation

## 2019-07-11 DIAGNOSIS — F17210 Nicotine dependence, cigarettes, uncomplicated: Secondary | ICD-10-CM | POA: Diagnosis not present

## 2019-07-11 DIAGNOSIS — I70249 Atherosclerosis of native arteries of left leg with ulceration of unspecified site: Secondary | ICD-10-CM | POA: Diagnosis not present

## 2019-07-11 HISTORY — PX: LOWER EXTREMITY ANGIOGRAPHY: CATH118251

## 2019-07-11 LAB — BUN: BUN: 11 mg/dL (ref 8–23)

## 2019-07-11 LAB — CREATININE, SERUM
Creatinine, Ser: 0.69 mg/dL (ref 0.61–1.24)
GFR calc Af Amer: >60 mL/min (ref >60)
GFR calc non Af Amer: >60 mL/min (ref >60)

## 2019-07-11 SURGERY — LOWER EXTREMITY ANGIOGRAPHY
Anesthesia: Moderate Sedation | Site: Leg Lower | Laterality: Left

## 2019-07-11 MED ORDER — ASPIRIN EC 81 MG PO TBEC: 81.0000 mg | DELAYED_RELEASE_TABLET | Freq: Every day | ORAL | Status: DC

## 2019-07-11 MED ORDER — CLOPIDOGREL BISULFATE 75 MG PO TABS
ORAL_TABLET | ORAL | Status: AC
  Filled 2019-07-11: qty 1

## 2019-07-11 MED ORDER — HYDRALAZINE HCL 20 MG/ML IJ SOLN: 5.0000 mg | INTRAMUSCULAR | Status: DC | PRN

## 2019-07-11 MED ORDER — CLOPIDOGREL BISULFATE 300 MG PO TABS
300.0000 mg | ORAL_TABLET | Freq: Once | ORAL | Status: AC
  Administered 2019-07-11: 300 mg via ORAL

## 2019-07-11 MED ORDER — MIDAZOLAM HCL 2 MG/2ML IJ SOLN
INTRAMUSCULAR | Status: DC | PRN
  Administered 2019-07-11: 2 mg via INTRAVENOUS
  Administered 2019-07-11 (×3): 1 mg via INTRAVENOUS

## 2019-07-11 MED ORDER — ASPIRIN EC 81 MG PO TBEC: 81.0000 mg | DELAYED_RELEASE_TABLET | Freq: Every day | ORAL | 2 refills | Status: DC

## 2019-07-11 MED ORDER — SODIUM CHLORIDE 0.9 % IV SOLN: 250.0000 mL | INTRAVENOUS | Status: DC | PRN

## 2019-07-11 MED ORDER — ATORVASTATIN CALCIUM 10 MG PO TABS
10.0000 mg | ORAL_TABLET | Freq: Every day | ORAL | Status: DC
  Filled 2019-07-11 (×2): qty 1

## 2019-07-11 MED ORDER — MIDAZOLAM HCL 2 MG/ML PO SYRP: 8.0000 mg | ORAL_SOLUTION | Freq: Once | ORAL | Status: DC | PRN

## 2019-07-11 MED ORDER — ONDANSETRON HCL 4 MG/2ML IJ SOLN: 4.0000 mg | Freq: Four times a day (QID) | INTRAMUSCULAR | Status: DC | PRN

## 2019-07-11 MED ORDER — CEFAZOLIN SODIUM-DEXTROSE 2-4 GM/100ML-% IV SOLN
INTRAVENOUS | Status: AC
  Filled 2019-07-11: qty 100

## 2019-07-11 MED ORDER — ASPIRIN EC 81 MG PO TBEC
DELAYED_RELEASE_TABLET | ORAL | Status: AC
  Administered 2019-07-11: 81 mg via ORAL
  Filled 2019-07-11: qty 1

## 2019-07-11 MED ORDER — IODIXANOL 320 MG/ML IV SOLN
INTRAVENOUS | Status: DC | PRN
  Administered 2019-07-11: 120 mL via INTRA_ARTERIAL

## 2019-07-11 MED ORDER — SODIUM CHLORIDE 0.9% FLUSH: 3.0000 mL | INTRAVENOUS | Status: DC | PRN

## 2019-07-11 MED ORDER — DIPHENHYDRAMINE HCL 50 MG/ML IJ SOLN: 50.0000 mg | Freq: Once | INTRAMUSCULAR | Status: DC | PRN

## 2019-07-11 MED ORDER — MIDAZOLAM HCL 5 MG/5ML IJ SOLN
INTRAMUSCULAR | Status: AC
  Filled 2019-07-11: qty 5

## 2019-07-11 MED ORDER — HEPARIN SOD (PORK) LOCK FLUSH 100 UNIT/ML IV SOLN
INTRAVENOUS | Status: AC
  Filled 2019-07-11: qty 5

## 2019-07-11 MED ORDER — CLOPIDOGREL BISULFATE 75 MG PO TABS: 75.0000 mg | ORAL_TABLET | Freq: Every day | ORAL | Status: DC

## 2019-07-11 MED ORDER — SODIUM CHLORIDE 0.9% FLUSH: 3.0000 mL | Freq: Two times a day (BID) | INTRAVENOUS | Status: DC

## 2019-07-11 MED ORDER — SODIUM CHLORIDE FLUSH 0.9 % IV SOLN
INTRAVENOUS | Status: AC
  Filled 2019-07-11: qty 10

## 2019-07-11 MED ORDER — HYDROMORPHONE HCL 1 MG/ML IJ SOLN: 1.0000 mg | Freq: Once | INTRAMUSCULAR | Status: DC | PRN

## 2019-07-11 MED ORDER — SODIUM CHLORIDE 0.9 % IV SOLN: INTRAVENOUS | Status: DC

## 2019-07-11 MED ORDER — CEFAZOLIN SODIUM-DEXTROSE 2-4 GM/100ML-% IV SOLN
2.0000 g | Freq: Once | INTRAVENOUS | Status: AC
  Administered 2019-07-11: 2 g via INTRAVENOUS

## 2019-07-11 MED ORDER — CLOPIDOGREL BISULFATE 75 MG PO TABS
ORAL_TABLET | ORAL | Status: AC
  Filled 2019-07-11: qty 3

## 2019-07-11 MED ORDER — ACETAMINOPHEN 325 MG PO TABS: 650.0000 mg | ORAL_TABLET | ORAL | Status: DC | PRN

## 2019-07-11 MED ORDER — LABETALOL HCL 5 MG/ML IV SOLN: 10.0000 mg | INTRAVENOUS | Status: DC | PRN

## 2019-07-11 MED ORDER — NITROGLYCERIN 1 MG/10 ML FOR IR/CATH LAB
INTRA_ARTERIAL | Status: DC | PRN
  Administered 2019-07-11: 250 ug

## 2019-07-11 MED ORDER — CLOPIDOGREL BISULFATE 75 MG PO TABS: 75.0000 mg | ORAL_TABLET | Freq: Every day | ORAL | 11 refills | Status: DC

## 2019-07-11 MED ORDER — ATORVASTATIN CALCIUM 10 MG PO TABS: 10.0000 mg | ORAL_TABLET | Freq: Every day | ORAL | 11 refills | Status: DC

## 2019-07-11 MED ORDER — FENTANYL CITRATE (PF) 100 MCG/2ML IJ SOLN
INTRAMUSCULAR | Status: DC | PRN
  Administered 2019-07-11 (×4): 50 ug via INTRAVENOUS

## 2019-07-11 MED ORDER — METHYLPREDNISOLONE SODIUM SUCC 125 MG IJ SOLR: 125.0000 mg | Freq: Once | INTRAMUSCULAR | Status: DC | PRN

## 2019-07-11 MED ORDER — FAMOTIDINE 20 MG PO TABS: 40.0000 mg | ORAL_TABLET | Freq: Once | ORAL | Status: DC | PRN

## 2019-07-11 MED ORDER — HEPARIN SODIUM (PORCINE) 1000 UNIT/ML IJ SOLN
INTRAMUSCULAR | Status: AC
  Filled 2019-07-11: qty 1

## 2019-07-11 MED ORDER — HEPARIN SODIUM (PORCINE) 1000 UNIT/ML IJ SOLN
INTRAMUSCULAR | Status: DC | PRN
  Administered 2019-07-11: 5000 [IU] via INTRAVENOUS

## 2019-07-11 MED ORDER — FENTANYL CITRATE (PF) 100 MCG/2ML IJ SOLN
INTRAMUSCULAR | Status: AC
  Filled 2019-07-11: qty 2

## 2019-07-11 SURGICAL SUPPLY — 21 items
BALLN LUTONIX 6X220X130 (BALLOONS) ×2
BALLN LUTONIX DCB 4X100X130 (BALLOONS) ×2
BALLN LUTONIX DCB 5X80X130 (BALLOONS) ×2
BALLN ULTRVRSE 3X100X130C (BALLOONS) ×2
BALLN ULTRVRSE 3X220X150 (BALLOONS) ×2
BALLOON LUTONIX 6X220X130 (BALLOONS) IMPLANT
BALLOON LUTONIX DCB 4X100X130 (BALLOONS) IMPLANT
BALLOON LUTONIX DCB 5X80X130 (BALLOONS) IMPLANT
BALLOON ULTRVRSE 3X100X130C (BALLOONS) IMPLANT
BALLOON ULTRVRSE 3X220X150 (BALLOONS) IMPLANT
CATH BEACON 5 .038 100 VERT TP (CATHETERS) ×1 IMPLANT
CATH PIG 70CM (CATHETERS) ×1 IMPLANT
DEVICE PRESTO INFLATION (MISCELLANEOUS) ×1 IMPLANT
DEVICE STARCLOSE SE CLOSURE (Vascular Products) ×1 IMPLANT
GLIDEWIRE ADV .035X260CM (WIRE) ×1 IMPLANT
PACK ANGIOGRAPHY (CUSTOM PROCEDURE TRAY) ×2 IMPLANT
SHEATH ANL2 6FRX45 HC (SHEATH) ×1 IMPLANT
SHEATH BRITE TIP 5FRX11 (SHEATH) ×1 IMPLANT
STENT LIFESTENT 5F 6X80X135 (Permanent Stent) ×1 IMPLANT
WIRE G V18X300CM (WIRE) ×1 IMPLANT
WIRE J 3MM .035X145CM (WIRE) ×1 IMPLANT

## 2019-07-11 NOTE — Op Note (Signed)
[facility]  Percutaneous Study/Intervention Procedural Note   Date of Surgery: 07/11/2019  Surgeon(s):,    Assistants:none  Pre-operative Diagnosis: PAD with ulceration left lower extremity  Post-operative diagnosis:  Same  Procedure(s) Performed:             1.  Ultrasound guidance for vascular access right femoral artery             2.  Catheter placement into left common femoral artery from right femoral approach             3.  Aortogram and selective left lower extremity angiogram             4.  Percutaneous transluminal angioplasty of left peroneal artery with 3 mm diameter angioplasty balloon             5.   Percutaneous transluminal angioplasty of the left posterior tibial artery and tibioperoneal trunk with 3 mm diameter conventional and 4 mm diameter Lutonix drug-coated angioplasty balloon  6.  Percutaneous transluminal angioplasty of the left distal SFA and above-knee popliteal artery with 5 mm diameter by 22 cm length Lutonix drug-coated angioplasty balloon  7.  Stent placement to the proximal left SFA with 6 mm diameter by 8 cm length life stent postdilated with a 5 mm diameter Lutonix drug-coated angioplasty balloon             8.  StarClose closure device right femoral artery  EBL: 10 cc  Contrast: 120 cc  Fluoro Time: 17.2 minutes  Moderate Conscious Sedation Time: approximately 60 minutes using 5 mg of Versed and 200 mcg of Fentanyl              Indications:  Patient is a 68 y.o.male with severe nonhealing ulcerations of the left forefoot. The patient is brought in for angiography for further evaluation and potential treatment.  Due to the limb threatening nature of the situation, angiogram was performed for attempted limb salvage. The patient is aware that if the procedure fails, amputation would be expected.  The patient also understands that even with successful revascularization, amputation may still be required due to the  severity of the situation.  Risks and benefits are discussed and informed consent is obtained.   Procedure:  The patient was identified and appropriate procedural time out was performed.  The patient was then placed supine on the table and prepped and draped in the usual sterile fashion. Moderate conscious sedation was administered during a face to face encounter with the patient throughout the procedure with my supervision of the RN administering medicines and monitoring the patient's vital signs, pulse oximetry, telemetry and mental status throughout from the start of the procedure until the patient was taken to the recovery room. Ultrasound was used to evaluate the right common femoral artery.  It was patent .  A digital ultrasound image was acquired.  A Seldinger needle was used to access the right common femoral artery under direct ultrasound guidance and a permanent image was performed.  A 0.035 J wire was advanced without resistance and a 5Fr sheath was placed.  Pigtail catheter was placed into the aorta and an AP aortogram was performed. This demonstrated normal renal arteries and normal aorta and iliac segments without significant stenosis although image quality was poor due to patient motion. I then crossed the aortic bifurcation and advanced to the left femoral head. Selective left lower extremity angiogram was then performed. This demonstrated an ulcerated high-grade stenosis in the   proximal left SFA in the 90% range.  The vessel then normalized down into the mid to distal SFA and above-knee popliteal artery where there were multiple areas of ribbonlike disease creating greater than 70% stenosis.  The below-knee popliteal artery normalized and then there was a typical tibial trifurcation.  The anterior tibial artery was diffusely diseased and small with areas of short segment occlusion and high-grade stenosis throughout its course.  The peroneal artery had 2 areas of greater than 70% stenosis 1 in the  proximal and 1 in the midsegment.  The posterior tibial artery had a 80 to 90% stenosis in the proximal segment but then was large and continuous into the foot.  The tibioperoneal trunk also had a greater than 70% stenosis. It was felt that it was in the patient's best interest to proceed with intervention after these images to avoid a second procedure and a larger amount of contrast and fluoroscopy based off of the findings from the initial angiogram. The patient was systemically heparinized and a 6 [place] [employer] sheath was then placed over the [employer] wire. I then used a Kumpe catheter and the advantage wire to navigate through the SFA and popliteal lesions and get down into the below-knee popliteal artery.  I then exchanged for a V 18 wire and initially cross the tibioperoneal trunk and peroneal lesions parking the wire down to the ankle.  Angioplasty was then performed with a 3 mm diameter by 22 cm length angioplasty balloon inflated to 10 atm for 1 minute.  Completion imaging the peroneal artery showed less than 20% residual stenosis.  I then turned my attention to the posterior tibial artery.  Using the Kumpe catheter and advantage wire was able to get down across the posterior tibial artery lesion.  This lesion was then treated with a 3 mm diameter by 10 cm length angioplasty balloon inflated up through the tibioperoneal trunk and the proximal portion of the posterior tibial artery up to 16 atm for 1 minute.  Angioplasty was then performed on the popliteal artery and mid to distal SFA with a 5 mm diameter by 22 cm length Lutonix drug-coated angioplasty balloon inflated to 12 atm for 1 minute.  The posterior tibial artery continued to have a greater than 50% residual stenosis but the mid to distal SFA and above-knee popliteal artery had less than 20% residual stenosis after angioplasty.  For the ulcerated proximal SFA lesion I elected to primarily stent this with a 6 mm diameter by 8 cm length life  stent postdilated with a 5 mm diameter by 8 cm length Lutonix drug-coated angioplasty balloon.  There is about a 10 to 15% residual stenosis after stent placement.  I then turned my attention to the posterior tibial artery lesion.  This was a large and generous vessel and I upsized to a 4 mm diameter by 10 cm length Lutonix drug-coated angioplasty balloon inflated this to 6 atm for 1 minute.  Completion imaging showed significant spasm in the posterior tibial artery although the area of the actual lesion appeared to have a less than 20% residual stenosis.  Image quality has become very poor with continuous patient motion and combativeness throughout the procedure and further attempts at considering treatment on the anterior tibial artery or evaluation in more detail in the iliac arteries was really not possible as the patient broke a sterile field twice.  I did not feel we had any further option for continuing the procedure and we had already treated 2  of the tibial vessels in the SFA and popliteal arteries with marked improvement. I elected to terminate the procedure. The sheath was removed and StarClose closure device was deployed in the right femoral artery with excellent hemostatic result. The patient was taken to the recovery room in stable condition having tolerated the procedure well.  Findings:               Aortogram:  The renal arteries appear to be normal.  The aorta and iliac arteries are highly calcific but hemodynamically significant stenosis did not appear to be present although imaging was limited due to patient motion.             Left lower Extremity:  This demonstrated an ulcerated high-grade stenosis in the proximal left SFA in the 90% range.  The vessel then normalized down into the mid to distal SFA and above-knee popliteal artery where there were multiple areas of ribbonlike disease creating greater than 70% stenosis.  The below-knee popliteal artery normalized and then there was a typical  tibial trifurcation.  The anterior tibial artery was diffusely diseased and small with areas of short segment occlusion and high-grade stenosis throughout its course.  The peroneal artery had 2 areas of greater than 70% stenosis 1 in the proximal and 1 in the midsegment.  The posterior tibial artery had a 80 to 90% stenosis in the proximal segment but then was large and continuous into the foot.  The tibioperoneal trunk also had a greater than 70% stenosis   Disposition: Patient was taken to the recovery room in stable condition having tolerated the procedure well.  Complications: None    07/11/2019 11:50 AM   This note was created with Dragon Medical transcription system. Any errors in dictation are purely unintentional. 

## 2019-07-11 NOTE — Discharge Instructions (Signed)
Angiogram, Care After This sheet gives you information about how to care for yourself after your procedure. Your doctor may also give you more specific instructions. If you have problems or questions, contact your doctor. Follow these instructions at home: Insertion site care  Follow instructions from your doctor about how to take care of your long, thin tube (catheter) insertion area. Make sure you: ? Wash your hands with soap and water before you change your bandage (dressing). If you cannot use soap and water, use hand sanitizer. ? Change your bandage as told by your doctor. ? Leave stitches (sutures), skin glue, or skin tape (adhesive) strips in place. They may need to stay in place for 2 weeks or longer. If tape strips get loose and curl up, you may trim the loose edges. Do not remove tape strips completely unless your doctor says it is okay.  Do not take baths, swim, or use a hot tub until your doctor says it is okay.  You may shower 24-48 hours after the procedure or as told by your doctor. ? Gently wash the area with plain soap and water. ? Pat the area dry with a clean towel. ? Do not rub the area. This may cause bleeding.  Do not apply powder or lotion to the area. Keep the area clean and dry.  Check your insertion area every day for signs of infection. Check for: ? More redness, swelling, or pain. ? Fluid or blood. ? Warmth. ? Pus or a bad smell. Activity  Rest as told by your doctor, usually for 1-2 days.  Do not lift anything that is heavier than 10 lbs. (4.5 kg) or as told by your doctor.  Do not drive for 24 hours if you were given a medicine to help you relax (sedative).  Do not drive or use heavy machinery while taking prescription pain medicine. General instructions   Go back to your normal activities as told by your doctor, usually in about a week. Ask your doctor what activities are safe for you.  If the insertion area starts to bleed, lie flat and put  pressure on the area. If the bleeding does not stop, get help right away. This is an emergency.  Drink enough fluid to keep your pee (urine) clear or pale yellow.  Take over-the-counter and prescription medicines only as told by your doctor.  Keep all follow-up visits as told by your doctor. This is important. Contact a doctor if:  You have a fever.  You have chills.  You have more redness, swelling, or pain around your insertion area.  You have fluid or blood coming from your insertion area.  The insertion area feels warm to the touch.  You have pus or a bad smell coming from your insertion area.  You have more bruising around the insertion area.  Blood collects in the tissue around the insertion area (hematoma) that may be painful to the touch. Get help right away if:  You have a lot of pain in the insertion area.  The insertion area swells very fast.  The insertion area is bleeding, and the bleeding does not stop after holding steady pressure on the area.  The area near or just beyond the insertion area becomes pale, cool, tingly, or numb. These symptoms may be an emergency. Do not wait to see if the symptoms will go away. Get medical help right away. Call your local emergency services (911 in the [place] Do not drive yourself to the hospital.  Summary  After the procedure, it is common to have bruising and tenderness at the long, thin tube insertion area.  After the procedure, it is important to rest and drink plenty of fluids.  Do not take baths, swim, or use a hot tub until your doctor says it is okay to do so. You may shower 24-48 hours after the procedure or as told by your doctor.  If the insertion area starts to bleed, lie flat and put pressure on the area. If the bleeding does not stop, get help right away. This is an emergency. This information is not intended to replace advice given to you by your health care provider. Make sure you discuss any questions you have  with your health care provider. Document Released: 09/26/2008 Document Revised: 06/12/2017 Document Reviewed: 06/24/2016 Elsevier Patient Education  [employer]. Moderate Conscious Sedation, Adult, Care After These instructions provide you with information about caring for yourself after your procedure. Your health care provider may also give you more specific instructions. Your treatment has been planned according to current medical practices, but problems sometimes occur. Call your health care provider if you have any problems or questions after your procedure. What can I expect after the procedure? After your procedure, it is common:  To feel sleepy for several hours.  To feel clumsy and have poor balance for several hours.  To have poor judgment for several hours.  To vomit if you eat too soon. Follow these instructions at home: For at least 24 hours after the procedure:   Do not: ? Participate in activities where you could fall or become injured. ? Drive. ? Use heavy machinery. ? Drink alcohol. ? Take sleeping pills or medicines that cause drowsiness. ? Make important decisions or sign legal documents. ? Take care of children on your own.  Rest. Eating and drinking  Follow the diet recommended by your health care provider.  If you vomit: ? Drink water, juice, or soup when you can drink without vomiting. ? Make sure you have little or no nausea before eating solid foods. General instructions  Have a responsible adult stay with you until you are awake and alert.  Take over-the-counter and prescription medicines only as told by your health care provider.  If you smoke, do not smoke without supervision.  Keep all follow-up visits as told by your health care provider. This is important. Contact a health care provider if:  You keep feeling nauseous or you keep vomiting.  You feel light-headed.  You develop a rash.  You have a fever. Get help right away  if:  You have trouble breathing. This information is not intended to replace advice given to you by your health care provider. Make sure you discuss any questions you have with your health care provider. Document Released: 04/20/2013 Document Revised: 06/12/2017 Document Reviewed: 10/20/2015 Elsevier Patient Education  2020 [employer]. Femoral Site Care This sheet gives you information about how to care for yourself after your procedure. Your health care provider may also give you more specific instructions. If you have problems or questions, contact your health care provider. What can I expect after the procedure? After the procedure, it is common to have:  Bruising that usually fades within 1-2 weeks.  Tenderness at the site. Follow these instructions at home: Wound care  Follow instructions from your health care provider about how to take care of your insertion site. Make sure you: ? Wash your hands with soap and water before you  change your bandage (dressing). If soap and water are not available, use hand sanitizer. ? Change your dressing as told by your health care provider. ? Leave stitches (sutures), skin glue, or adhesive strips in place. These skin closures may need to stay in place for 2 weeks or longer. If adhesive strip edges start to loosen and curl up, you may trim the loose edges. Do not remove adhesive strips completely unless your health care provider tells you to do that.  Do not take baths, swim, or use a hot tub until your health care provider approves.  You may shower 24-48 hours after the procedure or as told by your health care provider. ? Gently wash the site with plain soap and water. ? Pat the area dry with a clean towel. ? Do not rub the site. This may cause bleeding.  Do not apply powder or lotion to the site. Keep the site clean and dry.  Check your femoral site every day for signs of infection. Check for: ? Redness, swelling, or pain. ? Fluid or  blood. ? Warmth. ? Pus or a bad smell. Activity  For the first 2-3 days after your procedure, or as long as directed: ? Avoid climbing stairs as much as possible. ? Do not squat.  Do not lift anything that is heavier than 10 lb (4.5 kg), or the limit that you are told, until your health care provider says that it is safe.  Rest as directed. ? Avoid sitting for a long time without moving. Get up to take short walks every 1-2 hours.  Do not drive for 24 hours if you were given a medicine to help you relax (sedative). General instructions  Take over-the-counter and prescription medicines only as told by your health care provider.  Keep all follow-up visits as told by your health care provider. This is important. Contact a health care provider if you have:  A fever or chills.  You have redness, swelling, or pain around your insertion site. Get help right away if:  The catheter insertion area swells very fast.  You pass out.  You suddenly start to sweat or your skin gets clammy.  The catheter insertion area is bleeding, and the bleeding does not stop when you hold steady pressure on the area.  The area near or just beyond the catheter insertion site becomes pale, cool, tingly, or numb. These symptoms may represent a serious problem that is an emergency. Do not wait to see if the symptoms will go away. Get medical help right away. Call your local emergency services (911 in the [place] Do not drive yourself to the hospital. Summary  After the procedure, it is common to have bruising that usually fades within 1-2 weeks.  Check your femoral site every day for signs of infection.  Do not lift anything that is heavier than 10 lb (4.5 kg), or the limit that you are told, until your health care provider says that it is safe. This information is not intended to replace advice given to you by your health care provider. Make sure you discuss any questions you have with your health care  provider. Document Released: 03/03/2014 Document Revised: 07/13/2017 Document Reviewed: 07/13/2017 Elsevier Patient Education  2020 [employer].

## 2019-07-11 NOTE — H&P (Signed)
[facility] History & Physical Update  The patient was interviewed and re-examined.  The patient's previous History and Physical has been reviewed and is unchanged.  There is no change in the plan of care. We plan to proceed with the scheduled procedure.  Leotis Pain, MD  07/11/2019, 9:29 AM

## 2019-07-11 NOTE — Progress Notes (Signed)
Notified patient that bugs were found on his person and clothing. Clothing was bagged per policy and returned to patient/placed in vehicle when discharged home with brother, Carl Bell. Patient was provided a set of scrubs, scrub jacket and blanket to ensure comfort and warmth. Discharge instructions pertinent to his wound and post procedure care provided to patient and brother, Carl Bell. Both expressed understanding and no further questions at this time.

## 2019-07-16 ENCOUNTER — Other Ambulatory Visit: Payer: Self-pay

## 2019-07-16 ENCOUNTER — Emergency Department
Admission: EM | Admit: 2019-07-16 | Discharge: 2019-07-16 | Disposition: A | Discharge status: Home / Self Care | Payer: Medicare HMO | Attending: Emergency Medicine | Admitting: Emergency Medicine

## 2019-07-16 DIAGNOSIS — R21 Rash and other nonspecific skin eruption: Secondary | ICD-10-CM | POA: Diagnosis not present

## 2019-07-16 DIAGNOSIS — B029 Zoster without complications: Secondary | ICD-10-CM | POA: Insufficient documentation

## 2019-07-16 DIAGNOSIS — Z79899 Other long term (current) drug therapy: Secondary | ICD-10-CM | POA: Diagnosis not present

## 2019-07-16 DIAGNOSIS — I10 Essential (primary) hypertension: Secondary | ICD-10-CM | POA: Diagnosis not present

## 2019-07-16 DIAGNOSIS — J449 Chronic obstructive pulmonary disease, unspecified: Secondary | ICD-10-CM | POA: Insufficient documentation

## 2019-07-16 DIAGNOSIS — R000 Tachycardia, unspecified: Secondary | ICD-10-CM | POA: Diagnosis not present

## 2019-07-16 DIAGNOSIS — L299 Pruritus, unspecified: Secondary | ICD-10-CM | POA: Diagnosis not present

## 2019-07-16 DIAGNOSIS — Z7982 Long term (current) use of aspirin: Secondary | ICD-10-CM | POA: Diagnosis not present

## 2019-07-16 DIAGNOSIS — F17210 Nicotine dependence, cigarettes, uncomplicated: Secondary | ICD-10-CM | POA: Diagnosis not present

## 2019-07-16 DIAGNOSIS — R52 Pain, unspecified: Secondary | ICD-10-CM | POA: Diagnosis not present

## 2019-07-16 MED ORDER — VALACYCLOVIR HCL 1 G PO TABS: 1000.0000 mg | ORAL_TABLET | Freq: Three times a day (TID) | ORAL | 0 refills | Status: DC

## 2019-07-16 MED ORDER — VALACYCLOVIR HCL 1 G PO TABS: 1000.0000 mg | ORAL_TABLET | Freq: Three times a day (TID) | ORAL | 0 refills | Status: AC

## 2019-07-16 MED ORDER — HYDROCODONE-ACETAMINOPHEN 5-325 MG PO TABS: 1.0000 | ORAL_TABLET | Freq: Four times a day (QID) | ORAL | 0 refills | Status: DC | PRN

## 2019-07-16 MED ORDER — HYDROCODONE-ACETAMINOPHEN 5-325 MG PO TABS: 1.0000 | ORAL_TABLET | Freq: Four times a day (QID) | ORAL | 0 refills | Status: AC | PRN

## 2019-07-16 MED ORDER — VALACYCLOVIR HCL 500 MG PO TABS
1000.0000 mg | ORAL_TABLET | Freq: Once | ORAL | Status: AC
  Administered 2019-07-16: 19:00:00 1000 mg via ORAL
  Filled 2019-07-16: qty 2

## 2019-07-16 NOTE — ED Notes (Signed)
Pt verbalized discharge instructions and has no questions at this time

## 2019-07-16 NOTE — ED Provider Notes (Signed)
Emergency Department Provider Note  ____________________________________________  Time seen: Approximately 6:47 PM  I have reviewed the triage vital signs and the nursing notes.   HISTORY  Chief Complaint Rash   Historian Patient     HPI Carl Bell is a 69 y.o. male with a history of PAD, left foot cellulitis and trench foot, presents to the emergency department with concern for a rash.  Patient states that rash is different than what typically appears on his left foot.  Patient has erythematous rash with vesicles along left upper back and left chest.  Patient describes rash is pruritic with a prodrome of burning.  He recently had a left lower extremity angiography conducted by Dr. Lucky Cowboy.  Patient has not noticed any fever, chills or worsening redness of the left lower extremity. No other alleviating measures have been attempted.    Past Medical History:  Diagnosis Date  . COPD (chronic obstructive pulmonary disease) ([place])   . Emphysema of lung ([place])   . Glaucoma   . Hypertension   . Lung cancer ([place])      Immunizations up to date:  Yes.     Past Medical History:  Diagnosis Date  . COPD (chronic obstructive pulmonary disease) ([place])   . Emphysema of lung ([place])   . Glaucoma   . Hypertension   . Lung cancer [facility])     Patient Active Problem List   Diagnosis Date Noted  . Atherosclerosis of native arteries of the extremities with ulceration ([place]) 06/28/2019  . Sepsis ([place]) 12/24/2017  . Protein-calorie malnutrition, severe 11/12/2017  . Acute respiratory failure ([place]) 11/10/2017  . Acute respiratory distress 12/22/2015  . COPD (chronic obstructive pulmonary disease) ([place]) 12/22/2015  . Acute bronchitis 12/22/2015  . HTN, goal below 140/80 12/22/2015  . Lung cancer, lingula ([place]) 09/23/2015  . Chronic edema 09/15/2014    Past Surgical History:  Procedure Laterality Date  . LOWER EXTREMITY ANGIOGRAPHY Left 07/11/2019   Procedure: LOWER EXTREMITY  ANGIOGRAPHY;  Surgeon: Algernon Huxley, MD;  Location: [facility];  Service: Cardiovascular;  Laterality: Left;  . LUNG BIOPSY    . PERIPHERAL VASCULAR CATHETERIZATION N/A 09/24/2015   Procedure: [name];  Surgeon: Algernon Huxley, MD;  Location: [facility];  Service: Cardiovascular;  Laterality: N/A;  . Port a cath placement      Prior to Admission medications   Medication Sig Start Date End Date Taking? Authorizing Provider  albuterol (PROVENTIL HFA;VENTOLIN HFA) 108 (90 Base) MCG/ACT inhaler Inhale 2 puffs into the lungs every 6 (six) hours as needed for wheezing or shortness of breath. Patient not taking: Reported on 07/11/2019 11/12/17   Vaughan Basta, MD  aspirin EC 81 MG tablet Take 1 tablet (81 mg total) by mouth daily. 07/11/19   Algernon Huxley, MD  atorvastatin (LIPITOR) 10 MG tablet Take 1 tablet (10 mg total) by mouth daily. 07/11/19 07/10/20  Algernon Huxley, MD  chlorpheniramine-HYDROcodone (TUSSIONEX PENNKINETIC ER) 10-8 MG/5ML SUER Take 5 mLs by mouth 2 (two) times daily. Patient not taking: Reported on 07/11/2019 10/30/18   Earleen Newport, MD  clopidogrel (PLAVIX) 75 MG tablet Take 1 tablet (75 mg total) by mouth daily. 07/11/19   Algernon Huxley, MD  feeding supplement, ENSURE ENLIVE, (ENSURE ENLIVE) LIQD Take 237 mLs by mouth 3 (three) times daily between meals. 11/12/17   Vaughan Basta, MD  HYDROcodone-acetaminophen (NORCO) 5-325 MG tablet Take 1 tablet by mouth every 6 (six) hours as needed for  up to 2 days for moderate pain. 07/16/19 07/18/19  Lannie Fields, PA-C  levofloxacin (LEVAQUIN) 750 MG tablet Take 1 tablet (750 mg total) by mouth daily. Patient not taking: Reported on 06/28/2019 10/30/18   Earleen Newport, MD  Multiple Vitamin (MULTIVITAMIN WITH MINERALS) TABS tablet Take 1 tablet by mouth daily. Patient not taking: Reported on 07/11/2019 11/13/17   Vaughan Basta, MD  predniSONE (DELTASONE) 10 MG tablet Take 1 tablet by  mouth as directed. 12/17/17   [provider]  predniSONE (DELTASONE) 10 MG tablet Take 50 mg daily taper by 10 mg daily then continue 10 mg as per your previous routine. Patient not taking: Reported on 06/28/2019 12/24/17   Fritzi Mandes, MD  predniSONE (DELTASONE) 50 MG tablet Take 1 tablet by mouth daily Patient not taking: Reported on 06/28/2019 10/30/18   Earleen Newport, MD  thiamine 100 MG tablet Take 1 tablet (100 mg total) by mouth daily. Patient not taking: Reported on 07/11/2019 11/13/17   Vaughan Basta, MD  TRAVATAN Z 0.004 % SOLN ophthalmic solution Place 1 drop into both eyes at bedtime.  04/01/18   [provider]  umeclidinium-vilanterol (ANORO ELLIPTA) 62.5-25 MCG/INH AEPB Inhale 1 puff into the lungs daily. Patient not taking: Reported on 06/28/2019 12/17/17   Laverle Hobby, MD  valACYclovir (VALTREX) 1000 MG tablet Take 1 tablet (1,000 mg total) by mouth 3 (three) times daily for 7 days. 07/16/19 07/23/19  Lannie Fields, PA-C    Allergies Patient has no known allergies.  Family History  Problem Relation Age of Onset  . Diabetes Mellitus II Mother   . Hypertension Father     Social History Social History   Tobacco Use  . Smoking status: Current Every Day Smoker    Packs/day: 0.50    Years: 50.00    Pack years: 25.00    Types: Cigarettes  . Smokeless tobacco: Never Used  Substance Use Topics  . Alcohol use: Yes    Alcohol/week: 0.0 standard drinks    Comment: 3 pints/week  . Drug use: No     Review of Systems  Constitutional: No fever/chills Eyes:  No discharge ENT: No upper respiratory complaints. Respiratory: no cough. No SOB/ use of accessory muscles to breath Gastrointestinal:   No nausea, no vomiting.  No diarrhea.  No constipation. Musculoskeletal: Negative for musculoskeletal pain. Skin: Patient has rash.     ____________________________________________   PHYSICAL EXAM:  VITAL SIGNS: ED Triage Vitals  Enc  Vitals Group     BP 07/16/19 1826 (!) 159/91     Pulse Rate 07/16/19 1826 (!) 109     Resp 07/16/19 1826 16     Temp 07/16/19 1826 98.2 F (36.8 C)     Temp Source 07/16/19 1826 Oral     SpO2 07/16/19 1826 100 %     Weight 07/16/19 1807 160 lb (72.6 kg)     Height 07/16/19 1807 6\' 1"  (1.854 m)     Head Circumference --      Peak Flow --      Pain Score 07/16/19 1807 5     Pain Loc --      Pain Edu? --      Excl. in [place]? --      Constitutional: Alert and oriented. Well appearing and in no acute distress. Eyes: Conjunctivae are normal. PERRL. EOMI. Head: Atraumatic. ENT: Cardiovascular: Normal rate, regular rhythm. Normal S1 and S2.  Good peripheral circulation. Respiratory: Normal respiratory effort without tachypnea or  retractions. Lungs CTAB. Good air entry to the bases with no decreased or absent breath sounds Gastrointestinal: Bowel sounds x 4 quadrants. Soft and nontender to palpation. No guarding or rigidity. No distention. Musculoskeletal: Full range of motion to all extremities. No obvious deformities noted Neurologic:  Normal for age. No gross focal neurologic deficits are appreciated.  Skin: Patient has erythematous rash with vesicle formation on the left chest and left upper back.  Rash does not cross the midline.  Patient also has some mild skin breakdown of the left foot consistent with trench foot.  Patient reports that he wears wet socks often.  No erythema of the left foot or left calf. Psychiatric: Mood and affect are normal for age. Speech and behavior are normal.   ____________________________________________   LABS (all labs ordered are listed, but only abnormal results are displayed)  Labs Reviewed - No data to display ____________________________________________  EKG   ____________________________________________  RADIOLOGY   No results found.  ____________________________________________    PROCEDURES  Procedure(s) performed:      Procedures     Medications  valACYclovir (VALTREX) tablet 1,000 mg (1,000 mg Oral Given 07/16/19 1842)     ____________________________________________   INITIAL IMPRESSION / ASSESSMENT AND PLAN / ED COURSE  Pertinent labs & imaging results that were available during my care of the patient were reviewed by me and considered in my medical decision making (see chart for details).    Assessment and plan: Shingles 69 year old male presents to the emergency department with an erythematous rash along the left chest and left upper back in a dermatomal distribution that does not cross the midline.  Patient was hypertensive and mildly tachycardic at triage but vital signs were otherwise reassuring.  Rashes consistent with shingles.  Patient's left foot was examined and he had some mild skin breakdown along the dorsal aspect of the left foot which was similar to descriptions in the past emergency department encounters.  There was no surrounding cellulitis or calf pain to palpation.  Patient denies new changes to left foot in the past several days.  He is presenting to the emergency department for rash along the left chest and left back.  Patient was started on Valtrex in the emergency department tonight.  He was discharged with Valtrex.  Strict return precautions were given to return with new or worsening symptoms.  All patient questions were answered.     ____________________________________________  FINAL CLINICAL IMPRESSION(S) / ED DIAGNOSES  Final diagnoses:  Herpes zoster without complication      NEW MEDICATIONS STARTED DURING THIS VISIT:  ED Discharge Orders         Ordered    valACYclovir (VALTREX) 1000 MG tablet  3 times daily,   Status:  Discontinued     07/16/19 1837    HYDROcodone-acetaminophen (NORCO) 5-325 MG tablet  Every 6 hours PRN,   Status:  Discontinued     07/16/19 1837    HYDROcodone-acetaminophen (NORCO) 5-325 MG tablet  Every 6 hours PRN      07/16/19 1846    valACYclovir (VALTREX) 1000 MG tablet  3 times daily     07/16/19 1846              This chart was dictated using voice recognition software/Dragon. Despite best efforts to proofread, errors can occur which can change the meaning. Any change was purely unintentional.  '   Karren Cobble 07/16/19 Melton Alar, MD 07/16/19 [phone]

## 2019-07-16 NOTE — ED Triage Notes (Signed)
Pt comes via [facility] with c/o rash following procedure completed on left foot. Pt states he had procedure and then developed rash following. Pt was prescribed medications.  Pt A*OX4. Pt states pain in foot and leg.  Pt has rash noted to back of neck and states it runs down his spine and leg/foot.

## 2019-07-16 NOTE — Discharge Instructions (Signed)
Take one tablet three times daily for the next seven days.

## 2019-08-01 ENCOUNTER — Other Ambulatory Visit (INDEPENDENT_AMBULATORY_CARE_PROVIDER_SITE_OTHER): Payer: Self-pay | Admitting: Vascular Surgery

## 2019-08-01 DIAGNOSIS — I70249 Atherosclerosis of native arteries of left leg with ulceration of unspecified site: Secondary | ICD-10-CM

## 2019-08-01 DIAGNOSIS — Z95820 Peripheral vascular angioplasty status with implants and grafts: Secondary | ICD-10-CM

## 2019-08-02 ENCOUNTER — Ambulatory Visit (INDEPENDENT_AMBULATORY_CARE_PROVIDER_SITE_OTHER): Payer: Medicare HMO | Admitting: Nurse Practitioner

## 2019-08-02 ENCOUNTER — Other Ambulatory Visit: Payer: Self-pay

## 2019-08-02 ENCOUNTER — Encounter (INDEPENDENT_AMBULATORY_CARE_PROVIDER_SITE_OTHER): Payer: Self-pay | Admitting: Nurse Practitioner

## 2019-08-02 ENCOUNTER — Ambulatory Visit (INDEPENDENT_AMBULATORY_CARE_PROVIDER_SITE_OTHER): Payer: Medicare HMO

## 2019-08-02 VITALS — BP 167/98 | HR 99 | Resp 16 | Wt 137.6 lb

## 2019-08-02 DIAGNOSIS — I7025 Atherosclerosis of native arteries of other extremities with ulceration: Secondary | ICD-10-CM

## 2019-08-02 DIAGNOSIS — Z95820 Peripheral vascular angioplasty status with implants and grafts: Secondary | ICD-10-CM | POA: Diagnosis not present

## 2019-08-02 DIAGNOSIS — Z720 Tobacco use: Secondary | ICD-10-CM

## 2019-08-02 DIAGNOSIS — J449 Chronic obstructive pulmonary disease, unspecified: Secondary | ICD-10-CM | POA: Diagnosis not present

## 2019-08-02 DIAGNOSIS — I70249 Atherosclerosis of native arteries of left leg with ulceration of unspecified site: Secondary | ICD-10-CM

## 2019-08-02 NOTE — Progress Notes (Signed)
SUBJECTIVE:  Patient ID: Carl Bell, male    DOB: 1950-12-09, 69 y.o.   MRN: [id number] Chief Complaint  Patient presents with  . Follow-up    [facility] 3week post le angio    HPI  Carl Bell is a 69 y.o. male The patient returns to the office for followup and review status post angiogram with intervention. The patient notes improvement in the lower extremity symptoms. No interval shortening of the patient's claudication distance or rest pain symptoms. Previous wounds have begun to heal.  Patient subsequently came down with shingles after angiogram.  No new ulcers or wounds have occurred since the last visit.  There have been no significant changes to the patient's overall health care.  The patient denies amaurosis fugax or recent TIA symptoms. There are no recent neurological changes noted. The patient denies history of DVT, PE or superficial thrombophlebitis. The patient denies recent episodes of angina or shortness of breath.   ABI's Rt=1.09 and Lt=1.10  (No previous studies) Duplex US of the bilateral tibial  lower extremity arterial system shows biphasic triphasic waveforms.  Good toe waveforms on left dampened on right   Past Medical History:  Diagnosis Date  . COPD (chronic obstructive pulmonary disease) ([place])   . Emphysema of lung ([place])   . Glaucoma   . Hypertension   . Lung cancer [facility])     Past Surgical History:  Procedure Laterality Date  . LOWER EXTREMITY ANGIOGRAPHY Left 07/11/2019   Procedure: LOWER EXTREMITY ANGIOGRAPHY;  Surgeon: Algernon Huxley, MD;  Location: [facility];  Service: Cardiovascular;  Laterality: Left;  . LUNG BIOPSY    . PERIPHERAL VASCULAR CATHETERIZATION N/A 09/24/2015   Procedure: [name];  Surgeon: Algernon Huxley, MD;  Location: [facility];  Service: Cardiovascular;  Laterality: N/A;  . Port a cath placement      Social History   Socioeconomic History  . Marital status: Legally Separated    Spouse name: Not  on file  . Number of children: Not on file  . Years of education: Not on file  . Highest education level: Not on file  Occupational History  . Not on file  Tobacco Use  . Smoking status: Current Every Day Smoker    Packs/day: 0.50    Years: 50.00    Pack years: 25.00    Types: Cigarettes  . Smokeless tobacco: Never Used  Substance and Sexual Activity  . Alcohol use: Yes    Alcohol/week: 0.0 standard drinks    Comment: 3 pints/week  . Drug use: No  . Sexual activity: Not on file  Other Topics Concern  . Not on file  Social History Narrative  . Not on file   Social Determinants of Health   Financial Resource Strain:   . Difficulty of Paying Living Expenses: Not on file  Food Insecurity:   . Worried About Charity fundraiser in the Last Year: Not on file  . Ran Out of Food in the Last Year: Not on file  Transportation Needs:   . Lack of Transportation (Medical): Not on file  . Lack of Transportation (Non-Medical): Not on file  Physical Activity:   . Days of Exercise per Week: Not on file  . Minutes of Exercise per Session: Not on file  Stress:   . Feeling of Stress : Not on file  Social Connections:   . Frequency of Communication with Friends and Family: Not on file  . Frequency  of Social Gatherings with Friends and Family: Not on file  . Attends Religious Services: Not on file  . Active Member of Clubs or Organizations: Not on file  . Attends Archivist Meetings: Not on file  . Marital Status: Not on file  Intimate Partner Violence:   . Fear of Current or Ex-Partner: Not on file  . Emotionally Abused: Not on file  . Physically Abused: Not on file  . Sexually Abused: Not on file    Family History  Problem Relation Age of Onset  . Diabetes Mellitus II Mother   . Hypertension Father     No Known Allergies   Review of Systems   Review of Systems: Negative Unless Checked Constitutional: [] Weight loss  [] Fever  [] Chills Cardiac: [] Chest pain   []   Atrial Fibrillation  [] Palpitations   [] Shortness of breath when laying flat   [] Shortness of breath with exertion. [] Shortness of breath at rest Vascular:  [] Pain in legs with walking   [] Pain in legs with standing [] Pain in legs when laying flat   [] Claudication    [] Pain in feet when laying flat    [] History of DVT   [] Phlebitis   [x] Swelling in legs   [] Varicose veins   [] Non-healing ulcers Pulmonary:   [] Uses home oxygen   [] Productive cough   [] Hemoptysis   [] Wheeze  [x] COPD   [] Asthma Neurologic:  [] Dizziness   [] Seizures  [] Blackouts [] History of stroke   [] History of TIA  [] Aphasia   [] Temporary Blindness   [] Weakness or numbness in arm   [] Weakness or numbness in leg Musculoskeletal:   [] Joint swelling   [] Joint pain   [] Low back pain  []  History of Knee Replacement [] Arthritis [] back Surgeries  []  Spinal Stenosis    Hematologic:  [] Easy bruising  [] Easy bleeding   [] Hypercoagulable state   [] Anemic Gastrointestinal:  [] Diarrhea   [] Vomiting  [] Gastroesophageal reflux/heartburn   [] Difficulty swallowing. [] Abdominal pain Genitourinary:  [] Chronic kidney disease   [] Difficult urination  [] Anuric   [] Blood in urine [] Frequent urination  [] Burning with urination   [] Hematuria Skin:  [] Rashes   [] Ulcers [x] Wounds Psychological:  [] History of anxiety   []  History of major depression  []  Memory Difficulties      OBJECTIVE:   Physical Exam  BP (!) 167/98 (BP Location: Right Arm)   Pulse 99   Resp 16   Wt 137 lb 9.6 oz (62.4 kg)   BMI 18.15 kg/m   Gen: WD/WN, NAD Head: [place]/AT, No temporalis wasting.  Ear/Nose/Throat: Hearing grossly intact, nares w/o erythema or drainage Eyes: PER, EOMI, sclera nonicteric.  Neck: Supple, no masses.  No JVD.  Pulmonary:  Good air movement, no use of accessory muscles.  Cardiac: RRR Vascular:  2+ edema bilaterally. Vessel Right Left  Dorsalis Pedis Not Palpable Not Palpable  Posterior Tibial Not Palpable Not Palpable   Gastrointestinal: soft,  non-distended. No guarding/no peritoneal signs.  Musculoskeletal: M/S 5/5 throughout.  No deformity or atrophy.  Neurologic: Pain and light touch intact in extremities.  Symmetrical.  Speech is fluent. Motor exam as listed above. Psychiatric: Judgment intact, Mood & affect appropriate for pt's clinical situation. Dermatologic: No Venous rashes. No Ulcers Noted.  No changes consistent with cellulitis. Lymph : No Cervical lymphadenopathy, dermal thickening bilaterally      ASSESSMENT AND PLAN:  1. Atherosclerosis of native arteries of the extremities with ulceration ([place]) Recommend:  The patient is status post successful angiogram with intervention.  The patient reports that the claudication symptoms  and leg pain is essentially gone.   The patient denies lifestyle limiting changes at this point in time.  No further invasive studies, angiography or surgery at this time The patient should continue walking and begin a more formal exercise program.  The patient should continue antiplatelet therapy and aggressive treatment of the lipid abnormalities  Smoking cessation was again discussed  The patient should continue wearing graduated compression socks 10-15 mmHg strength to control the mild edema.  Patient should undergo noninvasive studies as ordered. The patient will follow up with me after the studies.   - VAS [place] ABI WITH/WO TBI; Future  2. Chronic obstructive pulmonary disease, unspecified COPD type ([place]) Continue pulmonary medications and aerosols as already ordered, these medications have been reviewed and there are no changes at this time.    3. Tobacco abuse Smoking cessation was discussed, 3-10 minutes spent on this topic specifically    Current Outpatient Medications on File Prior to Visit  Medication Sig Dispense Refill  . aspirin EC 81 MG tablet Take 1 tablet (81 mg total) by mouth daily. 150 tablet 2  . atorvastatin (LIPITOR) 10 MG tablet Take 1 tablet (10 mg total) by  mouth daily. 30 tablet 11  . clopidogrel (PLAVIX) 75 MG tablet Take 1 tablet (75 mg total) by mouth daily. 30 tablet 11  . feeding supplement, ENSURE ENLIVE, (ENSURE ENLIVE) LIQD Take 237 mLs by mouth 3 (three) times daily between meals. 237 mL 12  . predniSONE (DELTASONE) 10 MG tablet Take 1 tablet by mouth as directed.    . TRAVATAN Z 0.004 % SOLN ophthalmic solution Place 1 drop into both eyes at bedtime.     Marland Kitchen albuterol (PROVENTIL HFA;VENTOLIN HFA) 108 (90 Base) MCG/ACT inhaler Inhale 2 puffs into the lungs every 6 (six) hours as needed for wheezing or shortness of breath. (Patient not taking: Reported on 07/11/2019) 1 Inhaler 3  . chlorpheniramine-HYDROcodone (TUSSIONEX PENNKINETIC ER) 10-8 MG/5ML SUER Take 5 mLs by mouth 2 (two) times daily. (Patient not taking: Reported on 07/11/2019) 140 mL 0  . levofloxacin (LEVAQUIN) 750 MG tablet Take 1 tablet (750 mg total) by mouth daily. (Patient not taking: Reported on 06/28/2019) 5 tablet 0  . Multiple Vitamin (MULTIVITAMIN WITH MINERALS) TABS tablet Take 1 tablet by mouth daily. (Patient not taking: Reported on 07/11/2019) 30 tablet 0  . predniSONE (DELTASONE) 10 MG tablet Take 50 mg daily taper by 10 mg daily then continue 10 mg as per your previous routine. (Patient not taking: Reported on 06/28/2019) 15 tablet 0  . predniSONE (DELTASONE) 50 MG tablet Take 1 tablet by mouth daily (Patient not taking: Reported on 06/28/2019) 4 tablet 0  . thiamine 100 MG tablet Take 1 tablet (100 mg total) by mouth daily. (Patient not taking: Reported on 07/11/2019) 30 tablet 0  . umeclidinium-vilanterol (ANORO ELLIPTA) 62.5-25 MCG/INH AEPB Inhale 1 puff into the lungs daily. (Patient not taking: Reported on 06/28/2019) 1 each 10   No current facility-administered medications on file prior to visit.    There are no Patient Instructions on file for this visit. No follow-ups on file.   Kris Hartmann, NP  This note was completed with Sales executive.  Any  errors are purely unintentional.

## 2019-09-12 ENCOUNTER — Observation Stay
Admission: EM | Admit: 2019-09-12 | Discharge: 2019-09-13 | Disposition: A | Discharge status: Home / Self Care | Payer: Medicare HMO | Attending: Internal Medicine | Admitting: Internal Medicine

## 2019-09-12 ENCOUNTER — Emergency Department: Payer: Medicare HMO

## 2019-09-12 ENCOUNTER — Encounter: Payer: Self-pay | Admitting: Emergency Medicine

## 2019-09-12 ENCOUNTER — Other Ambulatory Visit: Payer: Self-pay

## 2019-09-12 DIAGNOSIS — Z681 Body mass index (BMI) 19 or less, adult: Secondary | ICD-10-CM | POA: Diagnosis not present

## 2019-09-12 DIAGNOSIS — I10 Essential (primary) hypertension: Secondary | ICD-10-CM | POA: Insufficient documentation

## 2019-09-12 DIAGNOSIS — I499 Cardiac arrhythmia, unspecified: Secondary | ICD-10-CM | POA: Diagnosis not present

## 2019-09-12 DIAGNOSIS — R05 Cough: Secondary | ICD-10-CM | POA: Diagnosis not present

## 2019-09-12 DIAGNOSIS — F17210 Nicotine dependence, cigarettes, uncomplicated: Secondary | ICD-10-CM | POA: Diagnosis not present

## 2019-09-12 DIAGNOSIS — Z923 Personal history of irradiation: Secondary | ICD-10-CM | POA: Insufficient documentation

## 2019-09-12 DIAGNOSIS — I739 Peripheral vascular disease, unspecified: Secondary | ICD-10-CM | POA: Diagnosis present

## 2019-09-12 DIAGNOSIS — Z720 Tobacco use: Secondary | ICD-10-CM | POA: Diagnosis not present

## 2019-09-12 DIAGNOSIS — R21 Rash and other nonspecific skin eruption: Secondary | ICD-10-CM | POA: Insufficient documentation

## 2019-09-12 DIAGNOSIS — Z9221 Personal history of antineoplastic chemotherapy: Secondary | ICD-10-CM | POA: Diagnosis not present

## 2019-09-12 DIAGNOSIS — H409 Unspecified glaucoma: Secondary | ICD-10-CM | POA: Insufficient documentation

## 2019-09-12 DIAGNOSIS — R0602 Shortness of breath: Secondary | ICD-10-CM | POA: Diagnosis not present

## 2019-09-12 DIAGNOSIS — R0902 Hypoxemia: Secondary | ICD-10-CM | POA: Diagnosis not present

## 2019-09-12 DIAGNOSIS — E785 Hyperlipidemia, unspecified: Secondary | ICD-10-CM | POA: Diagnosis present

## 2019-09-12 DIAGNOSIS — Z7982 Long term (current) use of aspirin: Secondary | ICD-10-CM | POA: Diagnosis not present

## 2019-09-12 DIAGNOSIS — C3410 Malignant neoplasm of upper lobe, unspecified bronchus or lung: Secondary | ICD-10-CM | POA: Diagnosis not present

## 2019-09-12 DIAGNOSIS — C3490 Malignant neoplasm of unspecified part of unspecified bronchus or lung: Secondary | ICD-10-CM | POA: Insufficient documentation

## 2019-09-12 DIAGNOSIS — Z20822 Contact with and (suspected) exposure to covid-19: Secondary | ICD-10-CM | POA: Diagnosis not present

## 2019-09-12 DIAGNOSIS — Z79899 Other long term (current) drug therapy: Secondary | ICD-10-CM | POA: Insufficient documentation

## 2019-09-12 DIAGNOSIS — J441 Chronic obstructive pulmonary disease with (acute) exacerbation: Secondary | ICD-10-CM | POA: Diagnosis present

## 2019-09-12 DIAGNOSIS — J9621 Acute and chronic respiratory failure with hypoxia: Secondary | ICD-10-CM | POA: Diagnosis present

## 2019-09-12 DIAGNOSIS — J439 Emphysema, unspecified: Principal | ICD-10-CM | POA: Insufficient documentation

## 2019-09-12 DIAGNOSIS — Z7902 Long term (current) use of antithrombotics/antiplatelets: Secondary | ICD-10-CM | POA: Diagnosis not present

## 2019-09-12 DIAGNOSIS — E46 Unspecified protein-calorie malnutrition: Secondary | ICD-10-CM | POA: Insufficient documentation

## 2019-09-12 DIAGNOSIS — Z8249 Family history of ischemic heart disease and other diseases of the circulatory system: Secondary | ICD-10-CM | POA: Insufficient documentation

## 2019-09-12 DIAGNOSIS — J9622 Acute and chronic respiratory failure with hypercapnia: Secondary | ICD-10-CM | POA: Insufficient documentation

## 2019-09-12 LAB — COMPREHENSIVE METABOLIC PANEL
ALT: 9 U/L (ref 0–44)
AST: 18 U/L (ref 15–41)
Albumin: 3.4 g/dL — ABNORMAL LOW (ref 3.5–5.0)
Alkaline Phosphatase: 58 U/L (ref 38–126)
Anion gap: 9 (ref 5–15)
BUN: 15 mg/dL (ref 8–23)
CO2: 27 mmol/L (ref 22–32)
Calcium: 8.9 mg/dL (ref 8.9–10.3)
Chloride: 104 mmol/L (ref 98–111)
Creatinine, Ser: 0.85 mg/dL (ref 0.61–1.24)
GFR calc Af Amer: >60 mL/min (ref >60)
GFR calc non Af Amer: >60 mL/min (ref >60)
Glucose, Bld: 107 mg/dL — ABNORMAL HIGH (ref 70–99)
Potassium: 3.9 mmol/L (ref 3.5–5.1)
Sodium: 140 mmol/L (ref 135–145)
Total Bilirubin: 0.7 mg/dL (ref 0.3–1.2)
Total Protein: 6.9 g/dL (ref 6.5–8.1)

## 2019-09-12 LAB — CBC WITH DIFFERENTIAL/PLATELET
Abs Immature Granulocytes: 0.02 10*3/uL (ref 0.00–0.07)
Basophils Absolute: 0 10*3/uL (ref 0.0–0.1)
Basophils Relative: 1 %
Eosinophils Absolute: 0.6 10*3/uL — ABNORMAL HIGH (ref 0.0–0.5)
Eosinophils Relative: 11 %
HCT: 32.8 % — ABNORMAL LOW (ref 39.0–52.0)
Hemoglobin: 10.9 g/dL — ABNORMAL LOW (ref 13.0–17.0)
Immature Granulocytes: 0 %
Lymphocytes Relative: 16 %
Lymphs Abs: 0.9 10*3/uL (ref 0.7–4.0)
MCH: 26.7 pg (ref 26.0–34.0)
MCHC: 33.2 g/dL (ref 30.0–36.0)
MCV: 80.4 fL (ref 80.0–100.0)
Monocytes Absolute: 0.7 10*3/uL (ref 0.1–1.0)
Monocytes Relative: 14 %
Neutro Abs: 3.1 10*3/uL (ref 1.7–7.7)
Neutrophils Relative %: 58 %
Platelets: 247 10*3/uL (ref 150–400)
RBC: 4.08 MIL/uL — ABNORMAL LOW (ref 4.22–5.81)
RDW: 16.1 % — ABNORMAL HIGH (ref 11.5–15.5)
WBC: 5.3 10*3/uL (ref 4.0–10.5)
nRBC: 0 % (ref 0.0–0.2)

## 2019-09-12 LAB — BLOOD GAS, VENOUS
Acid-Base Excess: 2.3 mmol/L — ABNORMAL HIGH (ref 0.0–2.0)
Bicarbonate: 31.4 mmol/L — ABNORMAL HIGH (ref 20.0–28.0)
O2 Saturation: 35 %
Patient temperature: 37
pCO2, Ven: 70 mmHg — ABNORMAL HIGH (ref 44.0–60.0)
pH, Ven: 7.26 (ref 7.250–7.430)
pO2, Ven: <31 mmHg — CL (ref 32.0–45.0)

## 2019-09-12 LAB — TROPONIN I (HIGH SENSITIVITY)
Troponin I (High Sensitivity): 4 ng/L (ref <18)
Troponin I (High Sensitivity): 4 ng/L (ref <18)

## 2019-09-12 LAB — LACTIC ACID, PLASMA
Lactic Acid, Venous: 1 mmol/L (ref 0.5–1.9)
Lactic Acid, Venous: 1.1 mmol/L (ref 0.5–1.9)

## 2019-09-12 LAB — RESPIRATORY PANEL BY RT PCR (FLU A&B, COVID)
Influenza A by PCR: NEGATIVE
Influenza B by PCR: NEGATIVE
SARS Coronavirus 2 by RT PCR: NEGATIVE

## 2019-09-12 MED ORDER — IPRATROPIUM-ALBUTEROL 0.5-2.5 (3) MG/3ML IN SOLN
3.0000 mL | Freq: Once | RESPIRATORY_TRACT | Status: AC
  Administered 2019-09-12: 3 mL via RESPIRATORY_TRACT
  Filled 2019-09-12: qty 3

## 2019-09-12 MED ORDER — BRIMONIDINE TARTRATE-TIMOLOL 0.2-0.5 % OP SOLN
1.0000 [drp] | Freq: Two times a day (BID) | OPHTHALMIC | Status: DC
  Filled 2019-09-12 (×2): qty 5

## 2019-09-12 MED ORDER — DM-GUAIFENESIN ER 30-600 MG PO TB12
1.0000 | ORAL_TABLET | Freq: Two times a day (BID) | ORAL | Status: DC
  Administered 2019-09-12 – 2019-09-13 (×3): 1 via ORAL
  Filled 2019-09-12 (×3): qty 1

## 2019-09-12 MED ORDER — CLOPIDOGREL BISULFATE 75 MG PO TABS
75.0000 mg | ORAL_TABLET | Freq: Every day | ORAL | Status: DC
  Administered 2019-09-12 – 2019-09-13 (×2): 75 mg via ORAL
  Filled 2019-09-12 (×2): qty 1

## 2019-09-12 MED ORDER — METHYLPREDNISOLONE SODIUM SUCC 40 MG IJ SOLR
40.0000 mg | Freq: Two times a day (BID) | INTRAMUSCULAR | Status: DC
  Administered 2019-09-12 – 2019-09-13 (×3): 40 mg via INTRAVENOUS
  Filled 2019-09-12 (×3): qty 1

## 2019-09-12 MED ORDER — AZITHROMYCIN 250 MG PO TABS
250.0000 mg | ORAL_TABLET | Freq: Every day | ORAL | Status: DC
  Administered 2019-09-13: 250 mg via ORAL
  Filled 2019-09-12: qty 1

## 2019-09-12 MED ORDER — ASPIRIN EC 81 MG PO TBEC
81.0000 mg | DELAYED_RELEASE_TABLET | Freq: Every day | ORAL | Status: DC
  Administered 2019-09-12 – 2019-09-13 (×2): 81 mg via ORAL
  Filled 2019-09-12 (×2): qty 1

## 2019-09-12 MED ORDER — ACETAMINOPHEN 325 MG PO TABS: 650.0000 mg | ORAL_TABLET | Freq: Four times a day (QID) | ORAL | Status: DC | PRN

## 2019-09-12 MED ORDER — BRIMONIDINE TARTRATE 0.2 % OP SOLN
1.0000 [drp] | Freq: Two times a day (BID) | OPHTHALMIC | Status: DC
  Administered 2019-09-12 – 2019-09-13 (×2): 1 [drp] via OPHTHALMIC
  Filled 2019-09-12 (×2): qty 5

## 2019-09-12 MED ORDER — ATORVASTATIN CALCIUM 10 MG PO TABS
10.0000 mg | ORAL_TABLET | Freq: Every day | ORAL | Status: DC
  Administered 2019-09-12 – 2019-09-13 (×2): 10 mg via ORAL
  Filled 2019-09-12 (×2): qty 1

## 2019-09-12 MED ORDER — TIMOLOL MALEATE 0.5 % OP SOLN
1.0000 [drp] | Freq: Two times a day (BID) | OPHTHALMIC | Status: DC
  Administered 2019-09-12 – 2019-09-13 (×2): 1 [drp] via OPHTHALMIC
  Filled 2019-09-12 (×2): qty 5

## 2019-09-12 MED ORDER — SODIUM CHLORIDE 0.9 % IV SOLN
500.0000 mg | Freq: Once | INTRAVENOUS | Status: AC
  Administered 2019-09-12: 500 mg via INTRAVENOUS
  Filled 2019-09-12: qty 500

## 2019-09-12 MED ORDER — HYDRALAZINE HCL 25 MG PO TABS: 25.0000 mg | ORAL_TABLET | Freq: Three times a day (TID) | ORAL | Status: DC | PRN

## 2019-09-12 MED ORDER — ONDANSETRON HCL 4 MG/2ML IJ SOLN: 4.0000 mg | Freq: Three times a day (TID) | INTRAMUSCULAR | Status: DC | PRN

## 2019-09-12 MED ORDER — ALBUTEROL SULFATE (2.5 MG/3ML) 0.083% IN NEBU: 2.5000 mg | INHALATION_SOLUTION | RESPIRATORY_TRACT | Status: DC | PRN

## 2019-09-12 MED ORDER — NICOTINE 21 MG/24HR TD PT24
21.0000 mg | MEDICATED_PATCH | Freq: Every day | TRANSDERMAL | Status: DC
  Filled 2019-09-12: qty 1

## 2019-09-12 MED ORDER — IPRATROPIUM-ALBUTEROL 0.5-2.5 (3) MG/3ML IN SOLN: 3.0000 mL | RESPIRATORY_TRACT | Status: DC

## 2019-09-12 MED ORDER — ENOXAPARIN SODIUM 40 MG/0.4ML ~~LOC~~ SOLN
40.0000 mg | SUBCUTANEOUS | Status: DC
  Administered 2019-09-12: 40 mg via SUBCUTANEOUS
  Filled 2019-09-12: qty 0.4

## 2019-09-12 MED ORDER — LATANOPROST 0.005 % OP SOLN
1.0000 [drp] | Freq: Every day | OPHTHALMIC | Status: DC
  Administered 2019-09-12: 1 [drp] via OPHTHALMIC
  Filled 2019-09-12 (×2): qty 2.5

## 2019-09-12 MED ORDER — ALBUTEROL SULFATE (2.5 MG/3ML) 0.083% IN NEBU: 2.5000 mg | INHALATION_SOLUTION | RESPIRATORY_TRACT | Status: DC

## 2019-09-12 NOTE — ED Provider Notes (Signed)
[facility] Emergency Department Provider Note   ____________________________________________   First MD Initiated Contact with Patient 09/12/19 [phone]     (approximate)  I have reviewed the triage vital signs and the nursing notes.   HISTORY  Chief Complaint Shortness of Breath and Cough    HPI Carl Bell is a 69 y.o. male brought to the ED via EMS from home with a chief complaint of shortness of breath and cough.  Patient with a history of COPD, lung cancer not on home oxygen who complains of progressive shortness of breath with cough productive of white phlegm for the past 3 days.  Denies associated fever, chest pain, abdominal pain, nausea, vomiting or diarrhea.  EMS reports patient had room air saturations of 90%.  Placed on nonrebreather en route to the ED.  Given 125 mg IV Solu-Medrol.       Past Medical History:  Diagnosis Date  . COPD (chronic obstructive pulmonary disease) ([place])   . Emphysema of lung ([place])   . Glaucoma   . Hypertension   . Lung cancer [facility])     Patient Active Problem List   Diagnosis Date Noted  . Atherosclerosis of native arteries of the extremities with ulceration ([place]) 06/28/2019  . Sepsis ([place]) 12/24/2017  . Protein-calorie malnutrition, severe 11/12/2017  . Acute respiratory failure ([place]) 11/10/2017  . Acute respiratory distress 12/22/2015  . COPD (chronic obstructive pulmonary disease) ([place]) 12/22/2015  . Acute bronchitis 12/22/2015  . HTN, goal below 140/80 12/22/2015  . Lung cancer, lingula ([place]) 09/23/2015  . Chronic edema 09/15/2014    Past Surgical History:  Procedure Laterality Date  . LOWER EXTREMITY ANGIOGRAPHY Left 07/11/2019   Procedure: LOWER EXTREMITY ANGIOGRAPHY;  Surgeon: Algernon Huxley, MD;  Location: [facility];  Service: Cardiovascular;  Laterality: Left;  . LUNG BIOPSY    . PERIPHERAL VASCULAR CATHETERIZATION N/A 09/24/2015   Procedure: [name];  Surgeon: Algernon Huxley,  MD;  Location: [facility];  Service: Cardiovascular;  Laterality: N/A;  . Port a cath placement      Prior to Admission medications   Medication Sig Start Date End Date Taking? Authorizing Provider  albuterol (PROVENTIL HFA;VENTOLIN HFA) 108 (90 Base) MCG/ACT inhaler Inhale 2 puffs into the lungs every 6 (six) hours as needed for wheezing or shortness of breath. Patient not taking: Reported on 07/11/2019 11/12/17   Vaughan Basta, MD  aspirin EC 81 MG tablet Take 1 tablet (81 mg total) by mouth daily. 07/11/19   Algernon Huxley, MD  atorvastatin (LIPITOR) 10 MG tablet Take 1 tablet (10 mg total) by mouth daily. 07/11/19 07/10/20  Algernon Huxley, MD  chlorpheniramine-HYDROcodone (TUSSIONEX PENNKINETIC ER) 10-8 MG/5ML SUER Take 5 mLs by mouth 2 (two) times daily. Patient not taking: Reported on 07/11/2019 10/30/18   Earleen Newport, MD  clopidogrel (PLAVIX) 75 MG tablet Take 1 tablet (75 mg total) by mouth daily. 07/11/19   Algernon Huxley, MD  feeding supplement, ENSURE ENLIVE, (ENSURE ENLIVE) LIQD Take 237 mLs by mouth 3 (three) times daily between meals. 11/12/17   Vaughan Basta, MD  levofloxacin (LEVAQUIN) 750 MG tablet Take 1 tablet (750 mg total) by mouth daily. Patient not taking: Reported on 06/28/2019 10/30/18   Earleen Newport, MD  Multiple Vitamin (MULTIVITAMIN WITH MINERALS) TABS tablet Take 1 tablet by mouth daily. Patient not taking: Reported on 07/11/2019 11/13/17   Vaughan Basta, MD  predniSONE (DELTASONE) 10 MG tablet Take 1  tablet by mouth as directed. 12/17/17   [provider]  predniSONE (DELTASONE) 10 MG tablet Take 50 mg daily taper by 10 mg daily then continue 10 mg as per your previous routine. Patient not taking: Reported on 06/28/2019 12/24/17   Fritzi Mandes, MD  predniSONE (DELTASONE) 50 MG tablet Take 1 tablet by mouth daily Patient not taking: Reported on 06/28/2019 10/30/18   Earleen Newport, MD  thiamine 100 MG tablet Take 1  tablet (100 mg total) by mouth daily. Patient not taking: Reported on 07/11/2019 11/13/17   Vaughan Basta, MD  TRAVATAN Z 0.004 % SOLN ophthalmic solution Place 1 drop into both eyes at bedtime.  04/01/18   [provider]  umeclidinium-vilanterol (ANORO ELLIPTA) 62.5-25 MCG/INH AEPB Inhale 1 puff into the lungs daily. Patient not taking: Reported on 06/28/2019 12/17/17   Laverle Hobby, MD    Allergies Patient has no known allergies.  Family History  Problem Relation Age of Onset  . Diabetes Mellitus II Mother   . Hypertension Father     Social History Social History   Tobacco Use  . Smoking status: Current Every Day Smoker    Packs/day: 0.50    Years: 50.00    Pack years: 25.00    Types: Cigarettes  . Smokeless tobacco: Never Used  Substance Use Topics  . Alcohol use: Yes    Alcohol/week: 0.0 standard drinks    Comment: 3 pints/week  . Drug use: No    Review of Systems  Constitutional: No fever/chills Eyes: No visual changes. ENT: No sore throat. Cardiovascular: Denies chest pain. Respiratory: Positive for cough and shortness of breath. Gastrointestinal: No abdominal pain.  No nausea, no vomiting.  No diarrhea.  No constipation. Genitourinary: Negative for dysuria. Musculoskeletal: Negative for back pain. Skin: Negative for rash. Neurological: Negative for headaches, focal weakness or numbness.   ____________________________________________   PHYSICAL EXAM:  VITAL SIGNS: ED Triage Vitals  Enc Vitals Group     BP      Pulse      Resp      Temp      Temp src      SpO2      Weight      Height      Head Circumference      Peak Flow      Pain Score      Pain Loc      Pain Edu?      Excl. in [place]?     Constitutional: Alert and oriented.  Cachectic appearing and in mild acute distress. Eyes: Conjunctivae are normal. PERRL. EOMI. Head: Atraumatic. Nose: No congestion/rhinnorhea. Mouth/Throat: Mucous membranes are moist.   Oropharynx non-erythematous. Neck: No stridor.   Cardiovascular: Normal rate, regular rhythm. Grossly normal heart sounds.  Good peripheral circulation. Respiratory: Increased respiratory effort.  Retractions. Lungs diminished bilaterally. Gastrointestinal: Soft and nontender. No distention. No abdominal bruits. No CVA tenderness. Musculoskeletal: No lower extremity tenderness nor edema.  No joint effusions. Neurologic:  Normal speech and language. No gross focal neurologic deficits are appreciated.  Skin:  Skin is warm, dry and intact. No rash noted. Psychiatric: Mood and affect are normal. Speech and behavior are normal.  ____________________________________________   LABS (all labs ordered are listed, but only abnormal results are displayed)  Labs Reviewed  CULTURE, BLOOD (ROUTINE X 2)  CULTURE, BLOOD (ROUTINE X 2)  RESPIRATORY PANEL BY RT PCR (FLU A&B, COVID)  LACTIC ACID, PLASMA  LACTIC ACID, PLASMA  CBC WITH DIFFERENTIAL/PLATELET  COMPREHENSIVE METABOLIC PANEL  TROPONIN I (HIGH SENSITIVITY)   ____________________________________________  EKG  Pending ____________________________________________  RADIOLOGY  ED MD interpretation: Pending  Official radiology report(s): No results found.  ____________________________________________   PROCEDURES  Procedure(s) performed (including Critical Care):  Procedures   ____________________________________________   INITIAL IMPRESSION / ASSESSMENT AND PLAN / ED COURSE  As part of my medical decision making, I reviewed the following data within the [facility] notes reviewed and incorporated, Labs reviewed, EKG interpreted, Old chart reviewed, Radiograph reviewed and Notes from prior ED visits     Carl Bell was evaluated in Emergency Department on 09/12/2019 for the symptoms described in the history of present illness. He was evaluated in the context of the global COVID-19 pandemic, which  necessitated consideration that the patient might be at risk for infection with the SARS-CoV-2 virus that causes COVID-19. Institutional protocols and algorithms that pertain to the evaluation of patients at risk for COVID-19 are in a state of rapid change based on information released by regulatory bodies including the CDC and federal and state organizations. These policies and algorithms were followed during the patient's care in the ED.    69 year old male with lung cancer and COPD who presents with respiratory difficulty. Differential includes, but is not limited to, viral syndrome, bronchitis including COPD exacerbation, pneumonia, reactive airway disease including asthma, CHF including exacerbation with or without pulmonary/interstitial edema, pneumothorax, ACS, thoracic trauma, and pulmonary embolism.  Room air saturation is 98% with tachypnea and retractions.  Placed on 2 L nasal cannula oxygen.  Patient was given 125 mg IV Solu-Medrol by EMS; will start DuoNeb.  Obtain sepsis work-up, troponin, COVID-19 swab, chest x-ray.   Clinical Course as of Sep 11 656  Mon Sep 12, 2019  0657 Care transferred to Dr. Joan Mayans at change of shift. Anticipate hospitalization if patient does not turn around sufficiently with duoneb treatments.   [JS]    Clinical Course User Index [JS] Paulette Blanch, MD     ____________________________________________   FINAL CLINICAL IMPRESSION(S) / ED DIAGNOSES  Final diagnoses:  COPD exacerbation ([place])  Shortness of breath     ED Discharge Orders    None       Note:  This document was prepared using Dragon voice recognition software and may include unintentional dictation errors.   Paulette Blanch, MD 09/12/19 [phone]

## 2019-09-12 NOTE — ED Provider Notes (Signed)
Work of breathing improved after 2nd nebulizer, though still with coarse lung sounds and expiratory wheezing bilaterally. Do not feel he necessitates BiPAP at this time. VBG with mild hypercarbia, pH 7.26. Will plan for 3rd nebulizer, azithromycin, and admission for COPD exacerbation. Patient comfortable with plan.    Lilia Pro., MD 09/12/19 [phone]

## 2019-09-12 NOTE — H&P (Signed)
History and Physical    Carl Bell ZDG:[id number] DOB: [birth year] DOA: 09/12/2019  Referring MD/NP/PA:   PCP: Kirk Ruths, MD   Patient coming from:  The patient is coming from home.  At baseline, pt is independent for most of ADL.        Chief Complaint: SOB  HPI: Carl Bell is a 69 y.o. male with medical history significant of lung cancer (s/p of chemo and radiation therapy, no surgery, in remission), tobacco abuse, COPD not on oxygen at home, hypertension, hyperlipidemia, PAD, who presents with shortness of breath.  Patient states that he has been having shortness of breath in the past 3 days, which has been progressively worsening.  He has productive cough with greenish colored mucus production.  No chest pain, fever or chills.  Patient has oxygen saturation 90% on room air, which improved to 99% on 2 L nasal cannula oxygen.  Patient does not have nausea, vomiting, diarrhea, abdominal pain, symptoms of UTI or unilateral weakness.  Patient states that he had shingles on the left neck area recently. The rash has arelady scabbed.  ED Course: pt was found to have negative Covid PCR, WBC 5.3, troponin IV, lactic acid 1.0, electrolytes renal function okay, temperature 97.5, blood pressure 134/88, heart rate 77, RR 27, chest x-ray negative.  Patient is placed on MedSurg bed for observation.  Review of Systems:   General: no fevers, chills, no body weight gain, has fatigue HEENT: no blurry vision, hearing changes or sore throat Respiratory: has dyspnea, coughing, wheezing CV: no chest pain, no palpitations GI: no nausea, vomiting, abdominal pain, diarrhea, constipation GU: no dysuria, burning on urination, increased urinary frequency, hematuria  Ext: no leg edema Neuro: no unilateral weakness, numbness, or tingling, no vision change or hearing loss Skin: No skin tear. MSK: No muscle spasm, no deformity, no limitation of range of movement in spin Heme: No easy bruising.   Travel history: No recent long distant travel.  Allergy: No Known Allergies  Past Medical History:  Diagnosis Date  . COPD (chronic obstructive pulmonary disease) ([place])   . Emphysema of lung ([place])   . Glaucoma   . Hypertension   . Lung cancer [facility])     Past Surgical History:  Procedure Laterality Date  . LOWER EXTREMITY ANGIOGRAPHY Left 07/11/2019   Procedure: LOWER EXTREMITY ANGIOGRAPHY;  Surgeon: Algernon Huxley, MD;  Location: [facility];  Service: Cardiovascular;  Laterality: Left;  . LUNG BIOPSY    . PERIPHERAL VASCULAR CATHETERIZATION N/A 09/24/2015   Procedure: [name];  Surgeon: Algernon Huxley, MD;  Location: [facility];  Service: Cardiovascular;  Laterality: N/A;  . Port a cath placement      Social History:  reports that he has been smoking cigarettes. He has a 25.00 pack-year smoking history. He has never used smokeless tobacco. He reports current alcohol use. He reports that he does not use drugs.  Family History:  Family History  Problem Relation Age of Onset  . Diabetes Mellitus II Mother   . Hypertension Father      Prior to Admission medications   Medication Sig Start Date End Date Taking? Authorizing Provider  albuterol (PROVENTIL HFA;VENTOLIN HFA) 108 (90 Base) MCG/ACT inhaler Inhale 2 puffs into the lungs every 6 (six) hours as needed for wheezing or shortness of breath. Patient not taking: Reported on 07/11/2019 11/12/17   Vaughan Basta, MD  aspirin EC 81 MG tablet Take 1 tablet (81 mg total)  by mouth daily. 07/11/19   Algernon Huxley, MD  atorvastatin (LIPITOR) 10 MG tablet Take 1 tablet (10 mg total) by mouth daily. 07/11/19 07/10/20  Algernon Huxley, MD  chlorpheniramine-HYDROcodone (TUSSIONEX PENNKINETIC ER) 10-8 MG/5ML SUER Take 5 mLs by mouth 2 (two) times daily. Patient not taking: Reported on 07/11/2019 10/30/18   Earleen Newport, MD  clopidogrel (PLAVIX) 75 MG tablet Take 1 tablet (75 mg total) by mouth daily.  07/11/19   Algernon Huxley, MD  feeding supplement, ENSURE ENLIVE, (ENSURE ENLIVE) LIQD Take 237 mLs by mouth 3 (three) times daily between meals. 11/12/17   Vaughan Basta, MD  levofloxacin (LEVAQUIN) 750 MG tablet Take 1 tablet (750 mg total) by mouth daily. Patient not taking: Reported on 06/28/2019 10/30/18   Earleen Newport, MD  Multiple Vitamin (MULTIVITAMIN WITH MINERALS) TABS tablet Take 1 tablet by mouth daily. Patient not taking: Reported on 07/11/2019 11/13/17   Vaughan Basta, MD  predniSONE (DELTASONE) 10 MG tablet Take 1 tablet by mouth as directed. 12/17/17   [provider]  predniSONE (DELTASONE) 10 MG tablet Take 50 mg daily taper by 10 mg daily then continue 10 mg as per your previous routine. Patient not taking: Reported on 06/28/2019 12/24/17   Fritzi Mandes, MD  predniSONE (DELTASONE) 50 MG tablet Take 1 tablet by mouth daily Patient not taking: Reported on 06/28/2019 10/30/18   Earleen Newport, MD  thiamine 100 MG tablet Take 1 tablet (100 mg total) by mouth daily. Patient not taking: Reported on 07/11/2019 11/13/17   Vaughan Basta, MD  TRAVATAN Z 0.004 % SOLN ophthalmic solution Place 1 drop into both eyes at bedtime.  04/01/18   [provider]  umeclidinium-vilanterol (ANORO ELLIPTA) 62.5-25 MCG/INH AEPB Inhale 1 puff into the lungs daily. Patient not taking: Reported on 06/28/2019 12/17/17   Laverle Hobby, MD    Physical Exam: Vitals:   09/12/19 0830 09/12/19 0900 09/12/19 1051 09/12/19 1051  BP: 134/88 130/87 (!) 140/94 (!) 140/94  Pulse: 77 75 74 75  Resp: (!) 27 18 18 18   Temp:   97.7 F (36.5 C) 97.7 F (36.5 C)  TempSrc:   Oral Oral  SpO2: 99% 100% 100% 100%  Weight:      Height:       General: Not in acute distress HEENT:       Eyes: PERRL, EOMI, no scleral icterus.       ENT: No discharge from the ears and nose, no pharynx injection, no tonsillar enlargement.        Neck: No JVD, no bruit, no mass  felt. Heme: No neck lymph node enlargement. Cardiac: S1/S2, RRR, No murmurs, No gallops or rubs. Respiratory: Has mild wheezing, rhonchi, coarse breathing sound bilaterally.   GI: Soft, nondistended, nontender, no rebound pain, no organomegaly, BS present. GU: No hematuria Ext: No pitting leg edema bilaterally. 2+DP/PT pulse bilaterally. Musculoskeletal: No joint deformities, No joint redness or warmth, no limitation of ROM in spin. Skin: has scabbed rashes in left neck area. Neuro: Alert, oriented X3, cranial nerves II-XII grossly intact, moves all extremities normally.  Psych: Patient is not psychotic, no suicidal or hemocidal ideation.  Labs on Admission: I have personally reviewed following labs and imaging studies  CBC: Recent Labs  Lab 09/12/19 0637  WBC 5.3  NEUTROABS 3.1  HGB 10.9*  HCT 32.8*  MCV 80.4  PLT 481   Basic Metabolic Panel: Recent Labs  Lab 09/12/19 0637  NA 140  K 3.9  CL 104  CO2 27  GLUCOSE 107*  BUN 15  CREATININE 0.85  CALCIUM 8.9   GFR: Estimated Creatinine Clearance: 77.8 mL/min (by C-G formula based on SCr of 0.85 mg/dL). Liver Function Tests: Recent Labs  Lab 09/12/19 0637  AST 18  ALT 9  ALKPHOS 58  BILITOT 0.7  PROT 6.9  ALBUMIN 3.4*   No results for input(s): LIPASE, AMYLASE in the last 168 hours. No results for input(s): AMMONIA in the last 168 hours. Coagulation Profile: No results for input(s): INR, PROTIME in the last 168 hours. Cardiac Enzymes: No results for input(s): CKTOTAL, CKMB, CKMBINDEX, TROPONINI in the last 168 hours. BNP (last 3 results) No results for input(s): PROBNP in the last 8760 hours. HbA1C: No results for input(s): HGBA1C in the last 72 hours. CBG: No results for input(s): GLUCAP in the last 168 hours. Lipid Profile: No results for input(s): CHOL, HDL, LDLCALC, TRIG, CHOLHDL, LDLDIRECT in the last 72 hours. Thyroid Function Tests: No results for input(s): TSH, T4TOTAL, FREET4, T3FREE, THYROIDAB  in the last 72 hours. Anemia Panel: No results for input(s): VITAMINB12, FOLATE, FERRITIN, TIBC, IRON, RETICCTPCT in the last 72 hours. Urine analysis:    Component Value Date/Time   COLORURINE YELLOW (A) 12/24/2017 0914   APPEARANCEUR HAZY (A) 12/24/2017 0914   LABSPEC 1.019 12/24/2017 0914   PHURINE 5.0 12/24/2017 0914   GLUCOSEU NEGATIVE 12/24/2017 0914   HGBUR NEGATIVE 12/24/2017 0914   BILIRUBINUR NEGATIVE 12/24/2017 0914   KETONESUR 5 (A) 12/24/2017 0914   PROTEINUR NEGATIVE 12/24/2017 0914   NITRITE NEGATIVE 12/24/2017 0914   LEUKOCYTESUR SMALL (A) 12/24/2017 0914   Sepsis Labs: @LABRCNTIP (procalcitonin:4,lacticidven:4) ) Recent Results (from the past 240 hour(s))  Culture, blood (routine x 2)     Status: None (Preliminary result)   Collection Time: 09/12/19  7:02 AM   Specimen: BLOOD  Result Value Ref Range Status   Specimen Description BLOOD BLOOD LEFT HAND  Final   Special Requests   Final    BOTTLES DRAWN AEROBIC AND ANAEROBIC Blood Culture adequate volume   Culture   Final    NO GROWTH <12 HOURS Performed at [facility], [address] [place], [place] [address]    Report Status PENDING  Incomplete  Culture, blood (routine x 2)     Status: None (Preliminary result)   Collection Time: 09/12/19  7:02 AM   Specimen: BLOOD  Result Value Ref Range Status   Specimen Description BLOOD BLOOD LEFT FOREARM  Final   Special Requests   Final    BOTTLES DRAWN AEROBIC AND ANAEROBIC Blood Culture adequate volume   Culture   Final    NO GROWTH <12 HOURS Performed at [facility] [place], [address] [place], [place] [address]    Report Status PENDING  Incomplete  Respiratory Panel by RT PCR (Flu A&B, Covid) - Nasopharyngeal Swab     Status: None   Collection Time: 09/12/19  7:02 AM   Specimen: Nasopharyngeal Swab  Result Value Ref Range Status   SARS Coronavirus 2 by RT PCR NEGATIVE NEGATIVE Final    Comment: (NOTE) SARS-CoV-2 target nucleic acids are  NOT DETECTED. The SARS-CoV-2 RNA is generally detectable in upper respiratoy specimens during the acute phase of infection. The lowest concentration of SARS-CoV-2 viral copies this assay can detect is 131 copies/mL. A negative result does not preclude SARS-Cov-2 infection and should not be used as the sole basis for treatment or other patient management decisions. A negative result may occur with  improper specimen collection/handling, submission of specimen other than nasopharyngeal swab, presence of viral mutation(s) within the areas targeted by this assay, and inadequate number of viral copies (<131 copies/mL). A negative result must be combined with clinical observations, patient history, and epidemiological information. The expected result is Negative. Fact Sheet for Patients:  PinkCheek.be Fact Sheet for Healthcare Providers:  GravelBags.it This test is not yet ap proved or cleared by the [place] FDA and  has been authorized for detection and/or diagnosis of SARS-CoV-2 by FDA under an Emergency Use Authorization (EUA). This EUA will remain  in effect (meaning this test can be used) for the duration of the COVID-19 declaration under Section 564(b)(1) of the Act, 21 U.S.C. section 360bbb-3(b)(1), unless the authorization is terminated or revoked sooner.    Influenza A by PCR NEGATIVE NEGATIVE Final   Influenza B by PCR NEGATIVE NEGATIVE Final    Comment: (NOTE) The Xpert Xpress SARS-CoV-2/FLU/RSV assay is intended as an aid in  the diagnosis of influenza from Nasopharyngeal swab specimens and  should not be used as a sole basis for treatment. Nasal washings and  aspirates are unacceptable for Xpert Xpress SARS-CoV-2/FLU/RSV  testing. Fact Sheet for Patients: PinkCheek.be Fact Sheet for Healthcare Providers: GravelBags.it This test is not yet approved or  cleared by the [place] FDA and  has been authorized for detection and/or diagnosis of SARS-CoV-2 by  FDA under an Emergency Use Authorization (EUA). This EUA will remain  in effect (meaning this test can be used) for the duration of the  Covid-19 declaration under Section 564(b)(1) of the Act, 21  U.S.C. section 360bbb-3(b)(1), unless the authorization is  terminated or revoked. Performed at [facility], [address] [place], [place] [address]      Radiological Exams on Admission: DG Chest [facility] 1 View  Result Date: 09/12/2019 CLINICAL DATA:  Shortness of breath. Cough for 3 days. Lung cancer. EXAM: PORTABLE CHEST 1 VIEW COMPARISON:  Two-view chest x-ray 09/09/2018. CT of the chest with contrast 05/20/2019 FINDINGS: The heart size is normal. Linear atelectasis is present in the left lung. No other focal airspace disease is present. There is no edema or effusion. Right IJ Port-A-Cath is stable. IMPRESSION: 1. Linear atelectasis in the left lung. 2. No other acute cardiopulmonary disease. Electronically Signed   By: San Morelle M.D.   On: 09/12/2019 07:12     EKG:  Not done in ED, will get one.   Assessment/Plan Principal Problem:   COPD exacerbation (HCC) Active Problems:   Lung cancer, lingula (H[facility])   Acute on chronic respiratory failure with hypoxia and hypercapnia (HCC)   Tobacco abuse   PAD (peripheral artery disease) (H[facility])   HLD (hyperlipidemia)   HTN (hypertension)   Acute on chronic respiratory failure with hypoxia and hypercapnia due to COPD exacerbation: Patient has a productive cough, wheezing and rhonchi on auscultation, clinically consistent with COPD exacerbation.  Patient does not have chest pain, no tachycardia.  Low suspicions for PE.  -will place on med-surg bed for obs -Bronchodilators -Solu-Medrol 40 mg IV tid -Azithromycin -Mucinex for cough  -Incentive spirometry -Follow up sputum culture -Nasal cannula oxygen as needed to  maintain O2 saturation 93% or greater  Lung cancer, lingula ([place]): s/p of chemo and radiation therapy no surgery.  In remission. -follow up with oncology  HTN: not taking meds. Bp 134/88 -hydralazine prn  HLD: -Lipitor  Tobacco abuse -nicotine patch  PAD (peripheral artery disease) ([place]): -on ASA, Plavix and lipitor  DVT ppx:  SQ Lovenox Code Status: Full code Family Communication: not done, no family member is at bed side.   Disposition Plan:  Anticipate discharge back to previous home environment Consults called:  none Admission status: Med-surg bed for obs    Date of Service 09/12/2019    [facility]   If 7PM-7AM, please contact night-coverage [email or web address] 09/12/2019, 11:41 AM

## 2019-09-12 NOTE — ED Triage Notes (Signed)
Patient presents to Emergency Department via [facility] from home with complaints of SOB with cough for 3 days  EMS found pt 90% on RA and moved to [facility] for WOB  History of recent shingle (left neck), left leg surgery 2 weeks ago for venous insufficiency and at that time started blood thinners, COPD and lung CA (right chest port), daily smoker, and uses albuterol but no at home O2  Pt WOB with 100% room air and gulping breaths   Insect found and captured off pt's clothes  - in cup in room

## 2019-09-12 NOTE — ED Notes (Signed)
Pt transported to room 233-2A by RN Deneise Lever

## 2019-09-12 NOTE — Plan of Care (Signed)
Pt educated

## 2019-09-12 NOTE — Progress Notes (Signed)
Multiple beg bugs found on pt and on his clothes. MD notified

## 2019-09-12 NOTE — ED Notes (Signed)
Recollect of green and red sent to lab

## 2019-09-13 DIAGNOSIS — J441 Chronic obstructive pulmonary disease with (acute) exacerbation: Secondary | ICD-10-CM | POA: Diagnosis not present

## 2019-09-13 MED ORDER — TRIAMCINOLONE ACETONIDE 0.1 % EX CREA
TOPICAL_CREAM | Freq: Two times a day (BID) | CUTANEOUS | Status: DC
  Filled 2019-09-13: qty 15

## 2019-09-13 MED ORDER — ALBUTEROL SULFATE HFA 108 (90 BASE) MCG/ACT IN AERS: 2.0000 | INHALATION_SPRAY | Freq: Four times a day (QID) | RESPIRATORY_TRACT | 0 refills | Status: DC | PRN

## 2019-09-13 MED ORDER — TRIAMCINOLONE ACETONIDE 0.1 % EX CREA: TOPICAL_CREAM | Freq: Two times a day (BID) | CUTANEOUS | 0 refills | Status: DC

## 2019-09-13 MED ORDER — NICOTINE 21 MG/24HR TD PT24: 21.0000 mg | MEDICATED_PATCH | Freq: Every day | TRANSDERMAL | 0 refills | Status: DC

## 2019-09-13 MED ORDER — CHLORHEXIDINE GLUCONATE CLOTH 2 % EX PADS
6.0000 | MEDICATED_PAD | Freq: Every day | CUTANEOUS | Status: DC
  Administered 2019-09-13: 6 via TOPICAL

## 2019-09-13 MED ORDER — PREDNISONE 20 MG PO TABS: 20.0000 mg | ORAL_TABLET | Freq: Every day | ORAL | 0 refills | Status: AC

## 2019-09-13 MED ORDER — LEVOFLOXACIN 750 MG PO TABS: 750.0000 mg | ORAL_TABLET | Freq: Every day | ORAL | 0 refills | Status: AC

## 2019-09-13 NOTE — Discharge Summary (Signed)
Physician Discharge Summary  Carl Bell MLY:[id number] DOB: 02-08-1951 DOA: 09/12/2019  PCP: Kirk Ruths, MD  Admit date: 09/12/2019 Discharge date: 09/13/2019  Admitted From: Home  Disposition:  Home  Recommendations for Outpatient Follow-up:  1. Follow up with PCP in 1-2 weeks 2.  Home Health: No Equipment/Devices: None  Discharge Condition: Stable CODE STATUS: Full Diet recommendation: Heart Healthy  Brief/Interim Summary: HPI: Carl Bell is a 69 y.o. male with medical history significant of lung cancer (s/p of chemo and radiation therapy, no surgery, in remission), tobacco abuse, COPD not on oxygen at home, hypertension, hyperlipidemia, PAD, who presents with shortness of breath.  Patient states that he has been having shortness of breath in the past 3 days, which has been progressively worsening.  He has productive cough with greenish colored mucus production.  No chest pain, fever or chills.  Patient has oxygen saturation 90% on room air, which improved to 99% on 2 L nasal cannula oxygen.  Patient does not have nausea, vomiting, diarrhea, abdominal pain, symptoms of UTI or unilateral weakness.  Patient states that he had shingles on the left neck area recently. The rash has arelady scabbed.  3/2: Patient seen and examined.  Respiratory status back to baseline.  No cough noted.  No wheezing noted.  Patient on room air and has remained on so.  Ambulated prior to discharge and was stable.  Medically stable for discharge home.  Will prescribe additional 5 days of Levaquin empirically and prednisone 20 mg a day.  We will also prescribe albuterol rescue inhaler.  Instructed floor to establish primary care appointment.  Patient also noted to have bedbugs.  Apparently he takes care of his 67 year old mother.  Social work contacted for [employer].  Wellness check completed.  No concerns.   Discharge Diagnoses:  Principal Problem:   COPD exacerbation ([place]) Active Problems:    Lung cancer, lingula (HCC)   Acute on chronic respiratory failure with hypoxia and hypercapnia (H[facility])   Tobacco abuse   PAD (peripheral artery disease) (H[facility])   HLD (hyperlipidemia)   HTN (hypertension)  Acute on chronic respiratory failure with hypoxia and hypercapnia due to COPD exacerbation Patient has a productive cough, wheezing and rhonchi on auscultation, clinically consistent with COPD exacerbation.   Patient does not have chest pain, no tachycardia.  Low suspicions for PE. Responded to IV Solu-Medrol Tapered off oxygen, on room air at time of discharge Stable for discharge home Prescribed 5 additional days of antibiotics and p.o. steroid therapy Prescribed albuterol MDI rescue inhaler Unit secretary to set up primary care appointment Patient encouraged to establish care with PCP for further evaluation management of underlying COPD   Bed Bugs Triamcinolone 0.1% prescribed Home safety eval requested  Lung cancer, lingula ([place]) s/p of chemo and radiation therapy no surgery.  In remission. -follow up with oncology  HTN not taking meds. Bp 134/88 -hydralazine prn PCP followup  HLD: -Lipitor  Tobacco abuse -nicotine patch  PAD (peripheral artery disease) ([place]): -on ASA, Plavix and lipitor   Discharge Instructions  Discharge Instructions    Diet - low sodium heart healthy   Complete by: As directed    Increase activity slowly   Complete by: As directed      Allergies as of 09/13/2019   No Known Allergies     Medication List    TAKE these medications   albuterol 108 (90 Base) MCG/ACT inhaler Commonly known as: VENTOLIN HFA Inhale 2 puffs into the lungs every 6 (  six) hours as needed for wheezing or shortness of breath.   aspirin EC 81 MG tablet Take 1 tablet (81 mg total) by mouth daily.   atorvastatin 10 MG tablet Commonly known as: Lipitor Take 1 tablet (10 mg total) by mouth daily.   clopidogrel 75 MG tablet Commonly known as: Plavix Take 1  tablet (75 mg total) by mouth daily.   Combigan 0.2-0.5 % ophthalmic solution Generic drug: brimonidine-timolol Place 1 drop into both eyes in the morning and at bedtime.   levofloxacin 750 MG tablet Commonly known as: Levaquin Take 1 tablet (750 mg total) by mouth daily for 5 days.   nicotine 21 mg/24hr patch Commonly known as: NICODERM CQ - dosed in mg/24 hours Place 1 patch (21 mg total) onto the skin daily.   predniSONE 20 MG tablet Commonly known as: Deltasone Take 1 tablet (20 mg total) by mouth daily for 5 days.   Travatan Z 0.004 % Soln ophthalmic solution Generic drug: Travoprost (BAK Free) Place 1 drop into both eyes at bedtime.   triamcinolone cream 0.1 % Commonly known as: KENALOG Apply topically 2 (two) times daily.      Follow-up Information    Kirk Ruths, MD. Schedule an appointment as soon as possible for a visit in 1 week(s).   Specialty: Internal Medicine Contact information: [facility] [address] [place] [address] [phone]          No Known Allergies  Consultations:  none   Procedures/Studies: DG Chest Port 1 View  Result Date: 09/12/2019 CLINICAL DATA:  Shortness of breath. Cough for 3 days. Lung cancer. EXAM: PORTABLE CHEST 1 VIEW COMPARISON:  Two-view chest x-ray 09/09/2018. CT of the chest with contrast 05/20/2019 FINDINGS: The heart size is normal. Linear atelectasis is present in the left lung. No other focal airspace disease is present. There is no edema or effusion. Right IJ Port-A-Cath is stable. IMPRESSION: 1. Linear atelectasis in the left lung. 2. No other acute cardiopulmonary disease. Electronically Signed   By: San Morelle M.D.   On: 09/12/2019 07:12    (Echo, Carotid, EGD, Colonoscopy, ERCP)    Subjective: Patient seen and examined on day of discharge On room air, no SOB Stable for discharge home   Discharge Exam: Vitals:   09/13/19 0355 09/13/19 0849  BP: (!) 142/91  (!) 157/98  Pulse: 77 78  Resp:  18  Temp: 97.7 F (36.5 C) 98.4 F (36.9 C)  SpO2: 95% 99%   Vitals:   09/12/19 1555 09/12/19 2028 09/13/19 0355 09/13/19 0849  BP: (!) 156/96 (!) 124/94 (!) 142/91 (!) 157/98  Pulse: 79 91 77 78  Resp: 17   18  Temp: 97.8 F (36.6 C) 98 F (36.7 C) 97.7 F (36.5 C) 98.4 F (36.9 C)  TempSrc: Oral Oral Oral   SpO2: 98% 100% 95% 99%  Weight:      Height:        General: Pt is alert, awake, not in acute distress Cardiovascular: RRR, S1/S2 +, no rubs, no gallops Respiratory: CTA bilaterally, no wheezing, no rhonchi Abdominal: Soft, NT, ND, bowel sounds + Extremities: no edema, no cyanosis    The results of significant diagnostics from this hospitalization (including imaging, microbiology, ancillary and laboratory) are listed below for reference.     Microbiology: Recent Results (from the past 240 hour(s))  Culture, blood (routine x 2)     Status: None (Preliminary result)   Collection Time: 09/12/19  7:02  AM   Specimen: BLOOD  Result Value Ref Range Status   Specimen Description BLOOD BLOOD LEFT HAND  Final   Special Requests   Final    BOTTLES DRAWN AEROBIC AND ANAEROBIC Blood Culture adequate volume   Culture   Final    NO GROWTH < 24 HOURS Performed at [facility], [address], [place], [place] [address]    Report Status PENDING  Incomplete  Culture, blood (routine x 2)     Status: None (Preliminary result)   Collection Time: 09/12/19  7:02 AM   Specimen: BLOOD  Result Value Ref Range Status   Specimen Description BLOOD BLOOD LEFT FOREARM  Final   Special Requests   Final    BOTTLES DRAWN AEROBIC AND ANAEROBIC Blood Culture adequate volume   Culture   Final    NO GROWTH < 24 HOURS Performed at [facility], [address], [place], [place] [address]    Report Status PENDING  Incomplete  Respiratory Panel by RT PCR (Flu A&B, Covid) - Nasopharyngeal Swab     Status: None   Collection Time: 09/12/19   7:02 AM   Specimen: Nasopharyngeal Swab  Result Value Ref Range Status   SARS Coronavirus 2 by RT PCR NEGATIVE NEGATIVE Final    Comment: (NOTE) SARS-CoV-2 target nucleic acids are NOT DETECTED. The SARS-CoV-2 RNA is generally detectable in upper respiratoy specimens during the acute phase of infection. The lowest concentration of SARS-CoV-2 viral copies this assay can detect is 131 copies/mL. A negative result does not preclude SARS-Cov-2 infection and should not be used as the sole basis for treatment or other patient management decisions. A negative result may occur with  improper specimen collection/handling, submission of specimen other than nasopharyngeal swab, presence of viral mutation(s) within the areas targeted by this assay, and inadequate number of viral copies (<131 copies/mL). A negative result must be combined with clinical observations, patient history, and epidemiological information. The expected result is Negative. Fact Sheet for Patients:  PinkCheek.be Fact Sheet for Healthcare Providers:  GravelBags.it This test is not yet ap proved or cleared by the [place] FDA and  has been authorized for detection and/or diagnosis of SARS-CoV-2 by FDA under an Emergency Use Authorization (EUA). This EUA will remain  in effect (meaning this test can be used) for the duration of the COVID-19 declaration under Section 564(b)(1) of the Act, 21 U.S.C. section 360bbb-3(b)(1), unless the authorization is terminated or revoked sooner.    Influenza A by PCR NEGATIVE NEGATIVE Final   Influenza B by PCR NEGATIVE NEGATIVE Final    Comment: (NOTE) The Xpert Xpress SARS-CoV-2/FLU/RSV assay is intended as an aid in  the diagnosis of influenza from Nasopharyngeal swab specimens and  should not be used as a sole basis for treatment. Nasal washings and  aspirates are unacceptable for Xpert Xpress SARS-CoV-2/FLU/RSV   testing. Fact Sheet for Patients: PinkCheek.be Fact Sheet for Healthcare Providers: GravelBags.it This test is not yet approved or cleared by the [place] FDA and  has been authorized for detection and/or diagnosis of SARS-CoV-2 by  FDA under an Emergency Use Authorization (EUA). This EUA will remain  in effect (meaning this test can be used) for the duration of the  Covid-19 declaration under Section 564(b)(1) of the Act, 21  U.S.C. section 360bbb-3(b)(1), unless the authorization is  terminated or revoked. Performed at [facility], [address] [place], [place] [address]      Labs: BNP (last 3 results) Recent  Labs    10/30/18 1941 03/10/19 1455  BNP 53.0 40.8   Basic Metabolic Panel: Recent Labs  Lab 09/12/19 0637  NA 140  K 3.9  CL 104  CO2 27  GLUCOSE 107*  BUN 15  CREATININE 0.85  CALCIUM 8.9   Liver Function Tests: Recent Labs  Lab 09/12/19 0637  AST 18  ALT 9  ALKPHOS 58  BILITOT 0.7  PROT 6.9  ALBUMIN 3.4*   No results for input(s): LIPASE, AMYLASE in the last 168 hours. No results for input(s): AMMONIA in the last 168 hours. CBC: Recent Labs  Lab 09/12/19 0637  WBC 5.3  NEUTROABS 3.1  HGB 10.9*  HCT 32.8*  MCV 80.4  PLT 247   Cardiac Enzymes: No results for input(s): CKTOTAL, CKMB, CKMBINDEX, TROPONINI in the last 168 hours. BNP: Invalid input(s): POCBNP CBG: No results for input(s): GLUCAP in the last 168 hours. D-Dimer No results for input(s): DDIMER in the last 72 hours. Hgb A1c No results for input(s): HGBA1C in the last 72 hours. Lipid Profile No results for input(s): CHOL, HDL, LDLCALC, TRIG, CHOLHDL, LDLDIRECT in the last 72 hours. Thyroid function studies No results for input(s): TSH, T4TOTAL, T3FREE, THYROIDAB in the last 72 hours.  Invalid input(s): FREET3 Anemia work up No results for input(s): VITAMINB12, FOLATE, FERRITIN, TIBC, IRON,  RETICCTPCT in the last 72 hours. Urinalysis    Component Value Date/Time   COLORURINE YELLOW (A) 12/24/2017 0914   APPEARANCEUR HAZY (A) 12/24/2017 0914   LABSPEC 1.019 12/24/2017 0914   PHURINE 5.0 12/24/2017 0914   GLUCOSEU NEGATIVE 12/24/2017 0914   HGBUR NEGATIVE 12/24/2017 0914   BILIRUBINUR NEGATIVE 12/24/2017 0914   KETONESUR 5 (A) 12/24/2017 0914   PROTEINUR NEGATIVE 12/24/2017 0914   NITRITE NEGATIVE 12/24/2017 0914   LEUKOCYTESUR SMALL (A) 12/24/2017 0914   Sepsis Labs Invalid input(s): PROCALCITONIN,  WBC,  LACTICIDVEN Microbiology Recent Results (from the past 240 hour(s))  Culture, blood (routine x 2)     Status: None (Preliminary result)   Collection Time: 09/12/19  7:02 AM   Specimen: BLOOD  Result Value Ref Range Status   Specimen Description BLOOD BLOOD LEFT HAND  Final   Special Requests   Final    BOTTLES DRAWN AEROBIC AND ANAEROBIC Blood Culture adequate volume   Culture   Final    NO GROWTH < 24 HOURS Performed at [facility], [address] [place], [place] [address]    Report Status PENDING  Incomplete  Culture, blood (routine x 2)     Status: None (Preliminary result)   Collection Time: 09/12/19  7:02 AM   Specimen: BLOOD  Result Value Ref Range Status   Specimen Description BLOOD BLOOD LEFT FOREARM  Final   Special Requests   Final    BOTTLES DRAWN AEROBIC AND ANAEROBIC Blood Culture adequate volume   Culture   Final    NO GROWTH < 24 HOURS Performed at [facility], [address], [place], [place] [address]    Report Status PENDING  Incomplete  Respiratory Panel by RT PCR (Flu A&B, Covid) - Nasopharyngeal Swab     Status: None   Collection Time: 09/12/19  7:02 AM   Specimen: Nasopharyngeal Swab  Result Value Ref Range Status   SARS Coronavirus 2 by RT PCR NEGATIVE NEGATIVE Final    Comment: (NOTE) SARS-CoV-2 target nucleic acids are NOT DETECTED. The SARS-CoV-2 RNA is generally detectable in upper  respiratoy specimens during the acute phase of infection. The lowest  concentration of SARS-CoV-2 viral copies this assay can detect is 131 copies/mL. A negative result does not preclude SARS-Cov-2 infection and should not be used as the sole basis for treatment or other patient management decisions. A negative result may occur with  improper specimen collection/handling, submission of specimen other than nasopharyngeal swab, presence of viral mutation(s) within the areas targeted by this assay, and inadequate number of viral copies (<131 copies/mL). A negative result must be combined with clinical observations, patient history, and epidemiological information. The expected result is Negative. Fact Sheet for Patients:  PinkCheek.be Fact Sheet for Healthcare Providers:  GravelBags.it This test is not yet ap proved or cleared by the [place] FDA and  has been authorized for detection and/or diagnosis of SARS-CoV-2 by FDA under an Emergency Use Authorization (EUA). This EUA will remain  in effect (meaning this test can be used) for the duration of the COVID-19 declaration under Section 564(b)(1) of the Act, 21 U.S.C. section 360bbb-3(b)(1), unless the authorization is terminated or revoked sooner.    Influenza A by PCR NEGATIVE NEGATIVE Final   Influenza B by PCR NEGATIVE NEGATIVE Final    Comment: (NOTE) The Xpert Xpress SARS-CoV-2/FLU/RSV assay is intended as an aid in  the diagnosis of influenza from Nasopharyngeal swab specimens and  should not be used as a sole basis for treatment. Nasal washings and  aspirates are unacceptable for Xpert Xpress SARS-CoV-2/FLU/RSV  testing. Fact Sheet for Patients: PinkCheek.be Fact Sheet for Healthcare Providers: GravelBags.it This test is not yet approved or cleared by the [place] FDA and  has been authorized for  detection and/or diagnosis of SARS-CoV-2 by  FDA under an Emergency Use Authorization (EUA). This EUA will remain  in effect (meaning this test can be used) for the duration of the  Covid-19 declaration under Section 564(b)(1) of the Act, 21  U.S.C. section 360bbb-3(b)(1), unless the authorization is  terminated or revoked. Performed at [facility], [address], [place], [place] [address]      Time coordinating discharge: Over 30 minutes  SIGNED:   Sidney Ace, MD  [facility] 09/13/2019, 4:50 PM Pager   If 7PM-7AM, please contact night-coverage

## 2019-09-13 NOTE — Progress Notes (Signed)
Educated patient on discharge instructions and will follow up with PCP in 1 week. Patient verbalized understanding. Patient will be driven home by family member.

## 2019-09-13 NOTE — Clinical Social Work Note (Addendum)
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 CSW called police to do wellness check.  --CSW heard back from police. Pt's mother appeared fine and answered the door. Pt was aware son was in hospital and would d/c today. No concerns noted from police.  [place], [place]

## 2019-09-17 LAB — CULTURE, BLOOD (ROUTINE X 2)
Culture: NO GROWTH
Culture: NO GROWTH
Special Requests: ADEQUATE
Special Requests: ADEQUATE

## 2019-09-20 DIAGNOSIS — I739 Peripheral vascular disease, unspecified: Secondary | ICD-10-CM | POA: Diagnosis not present

## 2019-09-20 DIAGNOSIS — E785 Hyperlipidemia, unspecified: Secondary | ICD-10-CM | POA: Diagnosis not present

## 2019-09-20 DIAGNOSIS — J449 Chronic obstructive pulmonary disease, unspecified: Secondary | ICD-10-CM | POA: Diagnosis not present

## 2019-09-20 DIAGNOSIS — I10 Essential (primary) hypertension: Secondary | ICD-10-CM | POA: Diagnosis not present

## 2019-09-20 DIAGNOSIS — F17210 Nicotine dependence, cigarettes, uncomplicated: Secondary | ICD-10-CM | POA: Diagnosis not present

## 2019-09-20 DIAGNOSIS — B029 Zoster without complications: Secondary | ICD-10-CM | POA: Diagnosis not present

## 2019-09-20 DIAGNOSIS — C3490 Malignant neoplasm of unspecified part of unspecified bronchus or lung: Secondary | ICD-10-CM | POA: Diagnosis not present

## 2019-09-20 DIAGNOSIS — R21 Rash and other nonspecific skin eruption: Secondary | ICD-10-CM | POA: Diagnosis not present

## 2019-09-20 DIAGNOSIS — Z7982 Long term (current) use of aspirin: Secondary | ICD-10-CM | POA: Diagnosis not present

## 2019-10-17 ENCOUNTER — Observation Stay
Admission: EM | Admit: 2019-10-17 | Discharge: 2019-10-18 | Disposition: A | Discharge status: Home / Self Care | Payer: Medicare HMO | Attending: Family Medicine | Admitting: Family Medicine

## 2019-10-17 ENCOUNTER — Other Ambulatory Visit: Payer: Self-pay

## 2019-10-17 ENCOUNTER — Emergency Department: Payer: Medicare HMO

## 2019-10-17 DIAGNOSIS — I739 Peripheral vascular disease, unspecified: Secondary | ICD-10-CM | POA: Diagnosis not present

## 2019-10-17 DIAGNOSIS — F17210 Nicotine dependence, cigarettes, uncomplicated: Secondary | ICD-10-CM | POA: Insufficient documentation

## 2019-10-17 DIAGNOSIS — Z7982 Long term (current) use of aspirin: Secondary | ICD-10-CM | POA: Insufficient documentation

## 2019-10-17 DIAGNOSIS — Z85118 Personal history of other malignant neoplasm of bronchus and lung: Secondary | ICD-10-CM | POA: Insufficient documentation

## 2019-10-17 DIAGNOSIS — J439 Emphysema, unspecified: Secondary | ICD-10-CM | POA: Diagnosis not present

## 2019-10-17 DIAGNOSIS — Z20822 Contact with and (suspected) exposure to covid-19: Secondary | ICD-10-CM | POA: Insufficient documentation

## 2019-10-17 DIAGNOSIS — J9622 Acute and chronic respiratory failure with hypercapnia: Secondary | ICD-10-CM | POA: Diagnosis not present

## 2019-10-17 DIAGNOSIS — J9621 Acute and chronic respiratory failure with hypoxia: Secondary | ICD-10-CM | POA: Diagnosis not present

## 2019-10-17 DIAGNOSIS — J441 Chronic obstructive pulmonary disease with (acute) exacerbation: Secondary | ICD-10-CM

## 2019-10-17 DIAGNOSIS — Z7902 Long term (current) use of antithrombotics/antiplatelets: Secondary | ICD-10-CM | POA: Diagnosis not present

## 2019-10-17 DIAGNOSIS — R062 Wheezing: Secondary | ICD-10-CM | POA: Diagnosis not present

## 2019-10-17 DIAGNOSIS — H409 Unspecified glaucoma: Secondary | ICD-10-CM | POA: Diagnosis not present

## 2019-10-17 DIAGNOSIS — J9601 Acute respiratory failure with hypoxia: Secondary | ICD-10-CM | POA: Diagnosis not present

## 2019-10-17 DIAGNOSIS — E785 Hyperlipidemia, unspecified: Secondary | ICD-10-CM | POA: Insufficient documentation

## 2019-10-17 DIAGNOSIS — I160 Hypertensive urgency: Secondary | ICD-10-CM | POA: Diagnosis not present

## 2019-10-17 DIAGNOSIS — R0602 Shortness of breath: Secondary | ICD-10-CM | POA: Diagnosis not present

## 2019-10-17 DIAGNOSIS — I10 Essential (primary) hypertension: Secondary | ICD-10-CM | POA: Insufficient documentation

## 2019-10-17 DIAGNOSIS — I499 Cardiac arrhythmia, unspecified: Secondary | ICD-10-CM | POA: Diagnosis not present

## 2019-10-17 DIAGNOSIS — Z79899 Other long term (current) drug therapy: Secondary | ICD-10-CM | POA: Diagnosis not present

## 2019-10-17 LAB — CBC WITH DIFFERENTIAL/PLATELET
Abs Immature Granulocytes: 0.01 10*3/uL (ref 0.00–0.07)
Basophils Absolute: 0.1 10*3/uL (ref 0.0–0.1)
Basophils Relative: 1 %
Eosinophils Absolute: 0.4 10*3/uL (ref 0.0–0.5)
Eosinophils Relative: 11 %
HCT: 38.8 % — ABNORMAL LOW (ref 39.0–52.0)
Hemoglobin: 12.3 g/dL — ABNORMAL LOW (ref 13.0–17.0)
Immature Granulocytes: 0 %
Lymphocytes Relative: 16 %
Lymphs Abs: 0.6 10*3/uL — ABNORMAL LOW (ref 0.7–4.0)
MCH: 26.3 pg (ref 26.0–34.0)
MCHC: 31.7 g/dL (ref 30.0–36.0)
MCV: 82.9 fL (ref 80.0–100.0)
Monocytes Absolute: 0.9 10*3/uL (ref 0.1–1.0)
Monocytes Relative: 22 %
Neutro Abs: 1.9 10*3/uL (ref 1.7–7.7)
Neutrophils Relative %: 50 %
Platelets: 206 10*3/uL (ref 150–400)
RBC: 4.68 MIL/uL (ref 4.22–5.81)
RDW: 16.2 % — ABNORMAL HIGH (ref 11.5–15.5)
WBC: 3.9 10*3/uL — ABNORMAL LOW (ref 4.0–10.5)
nRBC: 0 % (ref 0.0–0.2)

## 2019-10-17 LAB — BLOOD GAS, VENOUS
Acid-Base Excess: 1.5 mmol/L (ref 0.0–2.0)
Bicarbonate: 30.5 mmol/L — ABNORMAL HIGH (ref 20.0–28.0)
Delivery systems: POSITIVE
FIO2: 0.5
O2 Saturation: 35 %
PEEP: 5 cmH2O
Patient temperature: 37
Pressure support: 14 cmH2O
pCO2, Ven: 68 mmHg — ABNORMAL HIGH (ref 44.0–60.0)
pH, Ven: 7.26 (ref 7.250–7.430)
pO2, Ven: <31 mmHg — CL (ref 32.0–45.0)

## 2019-10-17 LAB — BLOOD GAS, ARTERIAL
Acid-Base Excess: 1.3 mmol/L (ref 0.0–2.0)
Bicarbonate: 27.2 mmol/L (ref 20.0–28.0)
FIO2: 0.28
O2 Saturation: 95.8 %
Patient temperature: 37
pCO2 arterial: 47 mmHg (ref 32.0–48.0)
pH, Arterial: 7.37 (ref 7.350–7.450)
pO2, Arterial: 83 mmHg (ref 83.0–108.0)

## 2019-10-17 LAB — BASIC METABOLIC PANEL
Anion gap: 6 (ref 5–15)
BUN: 18 mg/dL (ref 8–23)
CO2: 27 mmol/L (ref 22–32)
Calcium: 9 mg/dL (ref 8.9–10.3)
Chloride: 106 mmol/L (ref 98–111)
Creatinine, Ser: 0.83 mg/dL (ref 0.61–1.24)
GFR calc Af Amer: >60 mL/min (ref >60)
GFR calc non Af Amer: >60 mL/min (ref >60)
Glucose, Bld: 149 mg/dL — ABNORMAL HIGH (ref 70–99)
Potassium: 4.4 mmol/L (ref 3.5–5.1)
Sodium: 139 mmol/L (ref 135–145)

## 2019-10-17 LAB — RESPIRATORY PANEL BY RT PCR (FLU A&B, COVID)
Influenza A by PCR: NEGATIVE
Influenza B by PCR: NEGATIVE
SARS Coronavirus 2 by RT PCR: NEGATIVE

## 2019-10-17 MED ORDER — ALBUTEROL SULFATE (2.5 MG/3ML) 0.083% IN NEBU
5.0000 mg | INHALATION_SOLUTION | Freq: Once | RESPIRATORY_TRACT | Status: AC
  Administered 2019-10-17: 05:00:00 5 mg via RESPIRATORY_TRACT
  Filled 2019-10-17: qty 6

## 2019-10-17 MED ORDER — HYDRALAZINE HCL 20 MG/ML IJ SOLN: 10.0000 mg | Freq: Four times a day (QID) | INTRAMUSCULAR | Status: DC | PRN

## 2019-10-17 MED ORDER — TRIAMCINOLONE ACETONIDE 0.1 % EX CREA
TOPICAL_CREAM | Freq: Two times a day (BID) | CUTANEOUS | Status: DC
  Filled 2019-10-17 (×2): qty 15

## 2019-10-17 MED ORDER — BRIMONIDINE TARTRATE 0.2 % OP SOLN
1.0000 [drp] | Freq: Two times a day (BID) | OPHTHALMIC | Status: DC
  Administered 2019-10-17 – 2019-10-18 (×3): 1 [drp] via OPHTHALMIC
  Filled 2019-10-17 (×2): qty 5

## 2019-10-17 MED ORDER — SODIUM CHLORIDE 0.9% FLUSH: 3.0000 mL | INTRAVENOUS | Status: DC | PRN

## 2019-10-17 MED ORDER — LATANOPROST 0.005 % OP SOLN: 1.0000 [drp] | Freq: Every day | OPHTHALMIC | Status: DC

## 2019-10-17 MED ORDER — CLOPIDOGREL BISULFATE 75 MG PO TABS
75.0000 mg | ORAL_TABLET | Freq: Every day | ORAL | Status: DC
  Administered 2019-10-17 – 2019-10-18 (×2): 75 mg via ORAL
  Filled 2019-10-17 (×3): qty 1

## 2019-10-17 MED ORDER — OMEGA-3 FISH OIL 1000 MG PO CAPS: 1.0000 | ORAL_CAPSULE | Freq: Every day | ORAL | Status: DC

## 2019-10-17 MED ORDER — PREDNISONE 20 MG PO TABS
40.0000 mg | ORAL_TABLET | Freq: Every day | ORAL | Status: DC
  Administered 2019-10-17 – 2019-10-18 (×2): 40 mg via ORAL
  Filled 2019-10-17 (×2): qty 2

## 2019-10-17 MED ORDER — TIMOLOL MALEATE 0.5 % OP SOLN
1.0000 [drp] | Freq: Two times a day (BID) | OPHTHALMIC | Status: DC
  Administered 2019-10-17 – 2019-10-18 (×3): 1 [drp] via OPHTHALMIC
  Filled 2019-10-17 (×2): qty 5

## 2019-10-17 MED ORDER — TIMOLOL MALEATE 0.5 % OP SOLN: 1.0000 [drp] | Freq: Two times a day (BID) | OPHTHALMIC | Status: DC

## 2019-10-17 MED ORDER — IPRATROPIUM-ALBUTEROL 0.5-2.5 (3) MG/3ML IN SOLN: 3.0000 mL | Freq: Four times a day (QID) | RESPIRATORY_TRACT | Status: DC | PRN

## 2019-10-17 MED ORDER — BRIMONIDINE TARTRATE 0.2 % OP SOLN: 1.0000 [drp] | Freq: Two times a day (BID) | OPHTHALMIC | Status: DC

## 2019-10-17 MED ORDER — ATORVASTATIN CALCIUM 10 MG PO TABS
10.0000 mg | ORAL_TABLET | Freq: Every day | ORAL | Status: DC
  Administered 2019-10-17 – 2019-10-18 (×2): 10 mg via ORAL
  Filled 2019-10-17 (×2): qty 1

## 2019-10-17 MED ORDER — OMEGA-3-ACID ETHYL ESTERS 1 G PO CAPS
1.0000 g | ORAL_CAPSULE | Freq: Every day | ORAL | Status: DC
  Administered 2019-10-17 – 2019-10-18 (×2): 1 g via ORAL
  Filled 2019-10-17 (×2): qty 1

## 2019-10-17 MED ORDER — SODIUM CHLORIDE 0.9% FLUSH
3.0000 mL | Freq: Two times a day (BID) | INTRAVENOUS | Status: DC
  Administered 2019-10-17 (×2): 3 mL via INTRAVENOUS

## 2019-10-17 MED ORDER — ALBUTEROL SULFATE (2.5 MG/3ML) 0.083% IN NEBU: 2.5000 mg | INHALATION_SOLUTION | RESPIRATORY_TRACT | Status: DC | PRN

## 2019-10-17 MED ORDER — BRIMONIDINE TARTRATE-TIMOLOL 0.2-0.5 % OP SOLN
1.0000 [drp] | Freq: Two times a day (BID) | OPHTHALMIC | Status: DC
  Filled 2019-10-17: qty 5

## 2019-10-17 MED ORDER — MOMETASONE FURO-FORMOTEROL FUM 200-5 MCG/ACT IN AERO
1.0000 | INHALATION_SPRAY | Freq: Two times a day (BID) | RESPIRATORY_TRACT | Status: DC
  Administered 2019-10-17 – 2019-10-18 (×3): 1 via RESPIRATORY_TRACT
  Filled 2019-10-17 (×2): qty 8.8

## 2019-10-17 MED ORDER — ASPIRIN EC 81 MG PO TBEC
81.0000 mg | DELAYED_RELEASE_TABLET | Freq: Every day | ORAL | Status: DC
  Administered 2019-10-17 – 2019-10-18 (×2): 81 mg via ORAL
  Filled 2019-10-17 (×3): qty 1

## 2019-10-17 MED ORDER — NICOTINE 21 MG/24HR TD PT24
21.0000 mg | MEDICATED_PATCH | Freq: Every day | TRANSDERMAL | Status: DC
  Administered 2019-10-18: 21 mg via TRANSDERMAL
  Filled 2019-10-17 (×2): qty 1

## 2019-10-17 MED ORDER — LABETALOL HCL 5 MG/ML IV SOLN: 20.0000 mg | Freq: Once | INTRAVENOUS | Status: DC

## 2019-10-17 MED ORDER — SODIUM CHLORIDE 0.9 % IV SOLN: 250.0000 mL | INTRAVENOUS | Status: DC | PRN

## 2019-10-17 MED ORDER — VITAMIN D3 25 MCG (1000 UNIT) PO TABS
5000.0000 [IU] | ORAL_TABLET | Freq: Every day | ORAL | Status: DC
  Administered 2019-10-17 – 2019-10-18 (×2): 5000 [IU] via ORAL
  Filled 2019-10-17 (×4): qty 5

## 2019-10-17 MED ORDER — ENOXAPARIN SODIUM 40 MG/0.4ML ~~LOC~~ SOLN
40.0000 mg | SUBCUTANEOUS | Status: DC
  Administered 2019-10-17: 40 mg via SUBCUTANEOUS
  Filled 2019-10-17: qty 0.4

## 2019-10-17 MED ORDER — AMLODIPINE BESYLATE 10 MG PO TABS
10.0000 mg | ORAL_TABLET | Freq: Every day | ORAL | Status: DC
  Administered 2019-10-17 – 2019-10-18 (×2): 10 mg via ORAL
  Filled 2019-10-17: qty 1
  Filled 2019-10-17 (×2): qty 2

## 2019-10-17 MED ORDER — IPRATROPIUM BROMIDE 0.02 % IN SOLN: 0.5000 mg | Freq: Four times a day (QID) | RESPIRATORY_TRACT | Status: DC

## 2019-10-17 NOTE — ED Notes (Addendum)
Patient sleepy and does not want to take his meds at this time. He likes to take them with food and would prefer to take them at lunch time.  Messaged pharmacy and requested to change med times. Admitting MD at bedside.  Remains oriented and in NAD after bipap removed. Breakfast tray was offered but pt declined.

## 2019-10-17 NOTE — Evaluation (Signed)
Occupational Therapy Evaluation Patient Details Name: MARSHAUN LORTIE MRN: [id number] DOB: [birth year] Today's Date: 10/17/2019    History of Present Illness ZAKRY CASO is a 69 y.o. male with medical history significant for lung cancer status post chemo and radiation therapy, COPD, peripheral arterial disease and hypertension who presents to the emergency room for evaluation  of shortness of breath that progressively worsened.  COPD with acute exacerbation.   Clinical Impression   Patient has bed bugs.  Please wear appropriate PPE.  Patient supine in bed upon entry, agreeable to evaluation.  Patient's vitals at 135/92, 84HR and 97% 02 on 2L.  Patient states his BP is typically high and endorses no dizziness or lightheadedness.  Patient reports he was previously I with all ADLs and functional mobility.  Patient was inconsistent with answering home set up questions.  Patient reports no pain and generally requires CGA for for functional transfers at this time.  Patient notably winded with minimal exertion.  Provided education regarding PLB. Patient would benefit from participating in skilled occupational therapy to address activity tolerance, use of energy conservation techniques, compensatory techniques, strengthening, self pacing techniques and general ADL retraining.  Based on today's performance, recommending [place] OT at discharge.  Will continue to monitor and update as appropriate.        Follow Up Recommendations  Home health OT;Supervision - Intermittent    Equipment Recommendations       Recommendations for Other Services       Precautions / Restrictions Precautions Precautions: Fall;Other (comment) Precaution Comments: BED BUGS Restrictions Weight Bearing Restrictions: No      Mobility Bed Mobility Overal bed mobility: Needs Assistance Bed Mobility: Supine to Sit;Sit to Supine     Supine to sit: Supervision;HOB elevated Sit to supine: Supervision;HOB elevated       Transfers Overall transfer level: Needs assistance Equipment used: Rolling walker (2 wheeled) Transfers: Sit to/from Stand Sit to Stand: Min guard         General transfer comment: Requires cues for breathing techniques    Balance                                           ADL either performed or assessed with clinical judgement   ADL Overall ADL's : Needs assistance/impaired                     Lower Body Dressing: Min guard;Sit to/from stand   Toilet Transfer: Min guard;Stand-pivot;RW   Toileting- Water quality scientist and Hygiene: Min guard;Sit to/from stand       Functional mobility during ADLs: Min guard;Rolling walker General ADL Comments: Patient able to perform ADLs well physically, but activity tolerance/pulmonary endurance is a large limited factor.     Vision Patient Visual Report: No change from baseline       Perception     Praxis      Pertinent Vitals/Pain Pain Assessment: No/denies pain     Hand Dominance Right   Extremity/Trunk Assessment Upper Extremity Assessment Upper Extremity Assessment: Overall WFL for tasks assessed   Lower Extremity Assessment Lower Extremity Assessment: Defer to PT evaluation;Overall [facility] for tasks assessed   Cervical / Trunk Assessment Cervical / Trunk Assessment: Normal   Communication Communication Communication: No difficulties   Cognition Arousal/Alertness: Awake/alert Behavior During Therapy: WFL for tasks assessed/performed Overall Cognitive Status: Within Functional Limits for tasks assessed  General Comments  PATIENT HAS BED BUGS.    Exercises Other Exercises Other Exercises: Provided education on goals and role of OT in acute care setting Other Exercises: General safety education regarding bed controls, telephone, call light, etc Other Exercises: Performed sit<>stand transfers with CGA Other Exercises: Educated  patient in PLB techniques   Shoulder Instructions      Home Living Family/patient expects to be discharged to:: Private residence       Home Access: Stairs to enter Technical brewer of Steps: 3 Entrance Stairs-Rails: Can reach both Home Layout: One level     Bathroom Shower/Tub: Tub only             Additional Comments: Patient reports he has no AE/DME      Prior Functioning/Environment Level of Independence: Needs assistance  Gait / Transfers Assistance Needed: Pt reports I with mobility without AD ADL's / Homemaking Assistance Needed: Pt reports I            OT Problem List: Decreased activity tolerance;Cardiopulmonary status limiting activity;Decreased knowledge of precautions;Decreased knowledge of use of DME or AE      OT Treatment/Interventions: Self-care/ADL training;Therapeutic exercise;Energy conservation;DME and/or AE instruction;Therapeutic activities;Patient/family education    OT Goals(Current goals can be found in the care plan section) Acute Rehab OT Goals Patient Stated Goal: "breathe better" OT Goal Formulation: With patient Time For Goal Achievement: 10/31/19 Potential to Achieve Goals: Good  OT Frequency: Min 1X/week   Barriers to D/C:    Uncertain how accurate PLOF/home set up details are.       Co-evaluation              AM-PAC OT "6 Clicks" Daily Activity     Outcome Measure Help from another person eating meals?: None Help from another person taking care of personal grooming?: None Help from another person toileting, which includes using toliet, bedpan, or urinal?: A Little Help from another person bathing (including washing, rinsing, drying)?: A Little Help from another person to put on and taking off regular upper body clothing?: None Help from another person to put on and taking off regular lower body clothing?: A Little 6 Click Score: 21   End of Session Equipment Utilized During Treatment: Rolling walker Nurse  Communication: Precautions  Activity Tolerance: Patient tolerated treatment well Patient left: in bed;with call bell/phone within reach;with nursing/sitter in room  OT Visit Diagnosis: Unsteadiness on feet (R26.81);Other (comment)                Time: 4098-1191 OT Time Calculation (min): 21 min Charges:  OT General Charges $OT Visit: 1 Visit OT Evaluation $OT Eval Low Complexity: 1 Low OT Treatments $Therapeutic Activity: 8-22 mins  Baldomero Lamy, MS, OTR/L 10/17/19, 5:25 PM

## 2019-10-17 NOTE — ED Provider Notes (Signed)
[facility] Emergency Department Provider Note  ____________________________________________  Time seen: Approximately 5:13 AM  I have reviewed the triage vital signs and the nursing notes.   HISTORY  Chief Complaint Shortness of Breath    HPI STONEWALL DOSS is a 69 y.o. male with a history of COPD hypertension and lung cancer who comes ED complaining of shortness of breath for the past 24 hours, gradually worsening, severe, constant, no aggravating or alleviating factors.  Denies chest pain fever or cough.  EMS report giving 125 mg of Solu-Medrol, 2 g of mag, 2 duo nebs en route.  Initial room air oxygen saturation was about 85%.      Past Medical History:  Diagnosis Date  . COPD (chronic obstructive pulmonary disease) ([place])   . Emphysema of lung ([place])   . Glaucoma   . Hypertension   . Lung cancer [facility])      Patient Active Problem List   Diagnosis Date Noted  . COPD exacerbation ([place]) 09/12/2019  . Acute on chronic respiratory failure with hypoxia and hypercapnia ([place]) 09/12/2019  . Tobacco abuse 09/12/2019  . PAD (peripheral artery disease) ([place]) 09/12/2019  . HLD (hyperlipidemia) 09/12/2019  . HTN (hypertension) 09/12/2019  . Atherosclerosis of native arteries of the extremities with ulceration ([place]) 06/28/2019  . Sepsis ([place]) 12/24/2017  . Protein-calorie malnutrition, severe 11/12/2017  . Acute respiratory failure ([place]) 11/10/2017  . Acute respiratory distress 12/22/2015  . COPD (chronic obstructive pulmonary disease) ([place]) 12/22/2015  . Acute bronchitis 12/22/2015  . HTN, goal below 140/80 12/22/2015  . Lung cancer, lingula ([place]) 09/23/2015  . Chronic edema 09/15/2014     Past Surgical History:  Procedure Laterality Date  . LOWER EXTREMITY ANGIOGRAPHY Left 07/11/2019   Procedure: LOWER EXTREMITY ANGIOGRAPHY;  Surgeon: Algernon Huxley, MD;  Location: [place] CV LAB;  Service: Cardiovascular;  Laterality: Left;  . LUNG BIOPSY     . PERIPHERAL VASCULAR CATHETERIZATION N/A 09/24/2015   Procedure: [name];  Surgeon: Algernon Huxley, MD;  Location: [place] CV LAB;  Service: Cardiovascular;  Laterality: N/A;  . Port a cath placement       Prior to Admission medications   Medication Sig Start Date End Date Taking? Authorizing Provider  albuterol (VENTOLIN HFA) 108 (90 Base) MCG/ACT inhaler Inhale 2 puffs into the lungs every 6 (six) hours as needed for wheezing or shortness of breath. 09/13/19   Sidney Ace, MD  aspirin EC 81 MG tablet Take 1 tablet (81 mg total) by mouth daily. 07/11/19   Algernon Huxley, MD  atorvastatin (LIPITOR) 10 MG tablet Take 1 tablet (10 mg total) by mouth daily. 07/11/19 07/10/20  Algernon Huxley, MD  clopidogrel (PLAVIX) 75 MG tablet Take 1 tablet (75 mg total) by mouth daily. 07/11/19   Algernon Huxley, MD  COMBIGAN 0.2-0.5 % ophthalmic solution Place 1 drop into both eyes in the morning and at bedtime. 08/15/19   [provider]  nicotine (NICODERM CQ - DOSED IN MG/24 HOURS) 21 mg/24hr patch Place 1 patch (21 mg total) onto the skin daily. 09/13/19   Sreenath, Trula Slade, MD  TRAVATAN Z 0.004 % SOLN ophthalmic solution Place 1 drop into both eyes at bedtime.  04/01/18   [provider]  triamcinolone cream (KENALOG) 0.1 % Apply topically 2 (two) times daily. 09/13/19   Sidney Ace, MD     Allergies Patient has no known allergies.   Family History  Problem Relation Age of  Onset  . Diabetes Mellitus II Mother   . Hypertension Father     Social History Social History   Tobacco Use  . Smoking status: Current Every Day Smoker    Packs/day: 0.50    Years: 50.00    Pack years: 25.00    Types: Cigarettes  . Smokeless tobacco: Never Used  Substance Use Topics  . Alcohol use: Yes    Alcohol/week: 0.0 standard drinks    Comment: 3 pints/week  . Drug use: No    Review of Systems  Constitutional:   No fever or chills.  ENT:   No sore throat. No  rhinorrhea. Cardiovascular:   No chest pain or syncope. Respiratory: Positive shortness of breath as above without cough. Gastrointestinal:   Negative for abdominal pain, vomiting and diarrhea.  Musculoskeletal:   Negative for focal pain or swelling All other systems reviewed and are negative except as documented above in ROS and HPI.  ____________________________________________   PHYSICAL EXAM:  VITAL SIGNS: ED Triage Vitals  Enc Vitals Group     BP 10/17/19 0455 (!) 203/130     Pulse Rate 10/17/19 0455 89     Resp 10/17/19 0455 16     Temp 10/17/19 0455 (!) 97.5 F (36.4 C)     Temp Source 10/17/19 0455 Oral     SpO2 10/17/19 0455 (!) 88 %     Weight 10/17/19 0458 160 lb (72.6 kg)     Height 10/17/19 0458 6\' 1"  (1.854 m)     Head Circumference --      Peak Flow --      Pain Score 10/17/19 0458 0     Pain Loc --      Pain Edu? --      Excl. in [place]? --     Vital signs reviewed, nursing assessments reviewed.   Constitutional:   Alert and oriented.  Ill-appearing, respiratory distress. Eyes:   Conjunctivae are normal. EOMI. PERRL. ENT      Head:   Normocephalic and atraumatic.      Nose:   Wearing a mask.      Mouth/Throat:   Wearing a mask.      Neck:   No meningismus. Full ROM. Hematological/Lymphatic/Immunilogical:   No cervical lymphadenopathy. Cardiovascular:   RRR. Symmetric bilateral radial and DP pulses.  No murmurs. Cap refill less than 2 seconds. Respiratory: Diminished breath sounds diffusely, increased accessory muscle use.  Diffuse expiratory wheezing with prolonged expiratory phase.  No focal crackles Gastrointestinal:   Soft and nontender. Non distended. There is no CVA tenderness.  No rebound, rigidity, or guarding. Musculoskeletal:   Normal range of motion in all extremities. No joint effusions.  No lower extremity tenderness.  No edema. Neurologic:   Normal speech and language.  Motor grossly intact. No acute focal neurologic deficits are appreciated.   Skin:    Skin is warm, dry and intact. No rash noted.  No petechiae, purpura, or bullae.  ____________________________________________    LABS (pertinent positives/negatives) (all labs ordered are listed, but only abnormal results are displayed) Labs Reviewed  CBC WITH DIFFERENTIAL/PLATELET - Abnormal; Notable for the following components:      Result Value   WBC 3.9 (*)    Hemoglobin 12.3 (*)    HCT 38.8 (*)    RDW 16.2 (*)    Lymphs Abs 0.6 (*)    All other components within normal limits  BLOOD GAS, VENOUS - Abnormal; Notable for the following components:   pCO2, [place]  68 (*)    pO2, Ven <31.0 (*)    Bicarbonate 30.5 (*)    All other components within normal limits  RESPIRATORY PANEL BY RT PCR (FLU A&B, COVID)  BASIC METABOLIC PANEL   ____________________________________________   EKG  Interpreted by me Sinus rhythm rate of 89, normal axis and intervals.  Normal QRS ST segments and T waves.  No ischemic changes.  ____________________________________________    [id number]  DG Chest Portable 1 View  Result Date: 10/17/2019 CLINICAL DATA:  Shortness of breath. EXAM: PORTABLE CHEST 1 VIEW COMPARISON:  09/12/2019 FINDINGS: The right-sided power port is in good position, unchanged. The cardiac silhouette, mediastinal and hilar contours are within normal limits and stable. Stable mild tortuosity and calcification of the thoracic aorta. Emphysematous changes with fairly marked hyperinflation. Stable linear scarring type changes but no definite infiltrates or effusions. The bony thorax is intact. IMPRESSION: Emphysematous changes and pulmonary scarring but no definite acute overlying pulmonary process. Electronically Signed   By: Marijo Sanes M.D.   On: 10/17/2019 05:28    ____________________________________________   PROCEDURES .Critical Care Performed by: Carrie Mew, MD Authorized by: Carrie Mew, MD   Critical care provider statement:    Critical care time  (minutes):  35   Critical care time was exclusive of:  Separately billable procedures and treating other patients   Critical care was necessary to treat or prevent imminent or life-threatening deterioration of the following conditions:  Respiratory failure   Critical care was time spent personally by me on the following activities:  Development of treatment plan with patient or surrogate, discussions with consultants, evaluation of patient's response to treatment, examination of patient, obtaining history from patient or surrogate, ordering and performing treatments and interventions, ordering and review of laboratory studies, ordering and review of radiographic studies, pulse oximetry, re-evaluation of patient's condition and review of old charts    ____________________________________________  DIFFERENTIAL DIAGNOSIS   COPD exacerbation, pneumonia, pulmonary edema, pleural effusion  CLINICAL IMPRESSION / ASSESSMENT AND PLAN / ED COURSE  Medications ordered in the ED: Medications  labetalol (NORMODYNE) injection 20 mg (has no administration in time range)  albuterol (PROVENTIL) (2.5 MG/3ML) 0.083% nebulizer solution 5 mg (5 mg Nebulization Given 10/17/19 0505)    Pertinent labs & imaging results that were available during my care of the patient were reviewed by me and considered in my medical decision making (see chart for details).  TRACER GUTRIDGE was evaluated in Emergency Department on 10/17/2019 for the symptoms described in the history of present illness. He was evaluated in the context of the global COVID-19 pandemic, which necessitated consideration that the patient might be at risk for infection with the SARS-CoV-2 virus that causes COVID-19. Institutional protocols and algorithms that pertain to the evaluation of patients at risk for COVID-19 are in a state of rapid change based on information released by regulatory bodies including the CDC and federal and state organizations. These  policies and algorithms were followed during the patient's care in the ED.   Patient presents with respiratory distress, acute hypoxic respiratory failure.  In addition to medications already administered by EMS, I will give additional 2 albuterol nebs.  Placed on BiPAP on arrival for respiratory support given his hypoxia and markedly increased work of breathing.  Patient will need to be admitted for further stabilization.  Will give empiric antibiotics for CAP coverage.  Patient is not septic.  Given clinically apparent pulmonary findings, I doubt PE as a cause of his symptoms.  Clinical Course as of Oct 16 628  Mon Oct 17, 2019  1761 Chest x-ray image interpreted by me, no pneumothorax pulmonary edema or consolidation.  Underlying emphysema but no acute findings.  Radiology report reviewed which agrees.   [PS]    Clinical Course User Index [PS] Carrie Mew, MD     ----------------------------------------- 6:29 AM on 10/17/2019 -----------------------------------------  Persistent wheezing, other work of breathing is improved on BiPAP.  CBC unremarkable.  Case discussed with hospitalist for further evaluation and management.  ____________________________________________   FINAL CLINICAL IMPRESSION(S) / ED DIAGNOSES    Final diagnoses:  COPD exacerbation ([place])  Acute respiratory failure with hypoxia [facility])     ED Discharge Orders    None      Portions of this note were generated with dragon dictation software. Dictation errors may occur despite best attempts at proofreading.   Carrie Mew, MD 10/17/19 0630

## 2019-10-17 NOTE — H&P (Signed)
History and Physical    Carl Bell JME:[id number] DOB: Oct 31, 1950 DOA: 10/17/2019  PCP: Kirk Ruths, MD   Patient coming from: Home  I have personally briefly reviewed patient's old medical records in [place]  Chief Complaint: Shortness of breath  HPI: Carl Bell is a 69 y.o. male with medical history significant for lung cancer status post chemo and radiation therapy, COPD, peripheral arterial disease and hypertension who presents to the emergency room for evaluation  of shortness of breath that progressively worsened over the last 24 hours.  He was initially short of breath with exertion but is now short of breath at rest.  Shortness of breath is associated with cough productive of clear phlegm. He denies having any chest pain, fever, chills, nausea or vomiting, no orthopnea, no dizziness, no headaches or lightheadedness When EMS arrived patient was found to have a pulse oximetry of 85%.  He was noted to have diffuse wheezes and received Solu-Medrol 125 mg in the field as well as IV magnesium and bronchodilator therapy. Chest x ray showed emphysematous changes and pulmonary scarring but no definite acute overlying pulmonary process. Arterial blood gas showed uncompensated respiratory acidosis and patient was placed on BiPAP  ED Course: Patient presents with respiratory distress, acute hypoxic respiratory failure.  In addition to medications already administered by EMS, he received additional 2 albuterol nebs.  He was placed on BiPAP on arrival for respiratory support given his hypoxia and markedly increased work of breathing.  Patient will need to be admitted for further stabilization.     Review of Systems: As per HPI otherwise 10 point review of systems negative.    Past Medical History:  Diagnosis Date  . COPD (chronic obstructive pulmonary disease) ([place])   . Emphysema of lung ([place])   . Glaucoma   . Hypertension   . Lung cancer [facility])     Past Surgical  History:  Procedure Laterality Date  . LOWER EXTREMITY ANGIOGRAPHY Left 07/11/2019   Procedure: LOWER EXTREMITY ANGIOGRAPHY;  Surgeon: Algernon Huxley, MD;  Location: [place] CV LAB;  Service: Cardiovascular;  Laterality: Left;  . LUNG BIOPSY    . PERIPHERAL VASCULAR CATHETERIZATION N/A 09/24/2015   Procedure: [name];  Surgeon: Algernon Huxley, MD;  Location: [place] CV LAB;  Service: Cardiovascular;  Laterality: N/A;  . Port a cath placement       reports that he has been smoking cigarettes. He has a 25.00 pack-year smoking history. He has never used smokeless tobacco. He reports current alcohol use. He reports that he does not use drugs.  No Known Allergies  Family History  Problem Relation Age of Onset  . Diabetes Mellitus II Mother   . Hypertension Father      Prior to Admission medications   Medication Sig Start Date End Date Taking? Authorizing Provider  albuterol (VENTOLIN HFA) 108 (90 Base) MCG/ACT inhaler Inhale 2 puffs into the lungs every 6 (six) hours as needed for wheezing or shortness of breath. 09/13/19   Sidney Ace, MD  aspirin EC 81 MG tablet Take 1 tablet (81 mg total) by mouth daily. 07/11/19   Algernon Huxley, MD  atorvastatin (LIPITOR) 10 MG tablet Take 1 tablet (10 mg total) by mouth daily. 07/11/19 07/10/20  Algernon Huxley, MD  brimonidine (ALPHAGAN) 0.2 % ophthalmic solution Place 1 drop into both eyes in the morning and at bedtime.    [provider]  Cholecalciferol (VITAMIN D-3) 125 MCG (  5000 UT) TABS Take 5,000 Units by mouth daily.    [provider]  clopidogrel (PLAVIX) 75 MG tablet Take 1 tablet (75 mg total) by mouth daily. 07/11/19   Algernon Huxley, MD  COMBIGAN 0.2-0.5 % ophthalmic solution Place 1 drop into both eyes in the morning and at bedtime. 08/15/19   [provider]  latanoprost (XALATAN) 0.005 % ophthalmic solution Place 1 drop into both eyes at bedtime.    [provider]  nicotine  (NICODERM CQ - DOSED IN MG/24 HOURS) 21 mg/24hr patch Place 1 patch (21 mg total) onto the skin daily. 09/13/19   Sreenath, Trula Slade, MD  Omega-3 Fatty Acids (OMEGA-3 FISH OIL PO) Take 1 capsule by mouth daily.    [provider]  timolol (TIMOPTIC) 0.5 % ophthalmic solution Place 1 drop into both eyes in the morning and at bedtime.    [provider]  TRAVATAN Z 0.004 % SOLN ophthalmic solution Place 1 drop into both eyes at bedtime.  04/01/18   [provider]  triamcinolone cream (KENALOG) 0.1 % Apply topically 2 (two) times daily. 09/13/19   Sidney Ace, MD  valACYclovir (VALTREX) 1000 MG tablet Take 1,000 mg by mouth as directed. 07/17/19   [provider]  varenicline (CHANTIX PAK) 0.5 MG X 11 & 1 MG X 42 tablet Follow package directions. 09/20/19 10/22/19  [provider]  varenicline (CHANTIX) 1 MG tablet Take 1 mg by mouth 2 (two) times daily.    [provider]    Physical Exam: Vitals:   10/17/19 0455 10/17/19 0458  BP: (!) 203/130   Pulse: 89   Resp: 16   Temp: (!) 97.5 F (36.4 C)   TempSrc: Oral   SpO2: (!) 88%   Weight:  72.6 kg  Height:  6\' 1"  (1.854 m)     Vitals:   10/17/19 0455 10/17/19 0458  BP: (!) 203/130   Pulse: 89   Resp: 16   Temp: (!) 97.5 F (36.4 C)   TempSrc: Oral   SpO2: (!) 88%   Weight:  72.6 kg  Height:  6\' 1"  (1.854 m)    Constitutional: NAD, alert and oriented x 3.  Chronically ill-appearing Eyes: PERRL, lids and conjunctivae normal ENMT: Mucous membranes are moist.  Neck: normal, supple, no masses, no thyromegaly Respiratory: clear to auscultation bilaterally, scattered wheezing, no crackles. Normal respiratory effort. No accessory muscle use.  Cardiovascular: Regular rate and rhythm, no murmurs / rubs / gallops. No extremity edema. 2+ pedal pulses. No carotid bruits.  Abdomen: no tenderness, no masses palpated. No hepatosplenomegaly. Bowel sounds positive.  Musculoskeletal: no  clubbing / cyanosis. No joint deformity upper and lower extremities.  Skin: no rashes, lesions, ulcers.  Neurologic: No gross focal neurologic deficit. Psychiatric: Normal mood and affect.   Labs on Admission: I have personally reviewed following labs and imaging studies  CBC: Recent Labs  Lab 10/17/19 0451  WBC 3.9*  NEUTROABS 1.9  HGB 12.3*  HCT 38.8*  MCV 82.9  PLT 297   Basic Metabolic Panel: No results for input(s): NA, K, CL, CO2, GLUCOSE, BUN, CREATININE, CALCIUM, MG, PHOS in the last 168 hours. GFR: CrCl cannot be calculated (Patient's most recent lab result is older than the maximum 21 days allowed.). Liver Function Tests: No results for input(s): AST, ALT, ALKPHOS, BILITOT, PROT, ALBUMIN in the last 168 hours. No results for input(s): LIPASE, AMYLASE in the last 168 hours. No results for input(s): AMMONIA in the  last 168 hours. Coagulation Profile: No results for input(s): INR, PROTIME in the last 168 hours. Cardiac Enzymes: No results for input(s): CKTOTAL, CKMB, CKMBINDEX, TROPONINI in the last 168 hours. BNP (last 3 results) No results for input(s): PROBNP in the last 8760 hours. HbA1C: No results for input(s): HGBA1C in the last 72 hours. CBG: No results for input(s): GLUCAP in the last 168 hours. Lipid Profile: No results for input(s): CHOL, HDL, LDLCALC, TRIG, CHOLHDL, LDLDIRECT in the last 72 hours. Thyroid Function Tests: No results for input(s): TSH, T4TOTAL, FREET4, T3FREE, THYROIDAB in the last 72 hours. Anemia Panel: No results for input(s): VITAMINB12, FOLATE, FERRITIN, TIBC, IRON, RETICCTPCT in the last 72 hours. Urine analysis:    Component Value Date/Time   COLORURINE YELLOW (A) 12/24/2017 0914   APPEARANCEUR HAZY (A) 12/24/2017 0914   LABSPEC 1.019 12/24/2017 0914   PHURINE 5.0 12/24/2017 0914   GLUCOSEU NEGATIVE 12/24/2017 0914   HGBUR NEGATIVE 12/24/2017 0914   BILIRUBINUR NEGATIVE 12/24/2017 0914   KETONESUR 5 (A) 12/24/2017 0914    PROTEINUR NEGATIVE 12/24/2017 0914   NITRITE NEGATIVE 12/24/2017 0914   LEUKOCYTESUR SMALL (A) 12/24/2017 0914    Radiological Exams on Admission: DG Chest Portable 1 View  Result Date: 10/17/2019 CLINICAL DATA:  Shortness of breath. EXAM: PORTABLE CHEST 1 VIEW COMPARISON:  09/12/2019 FINDINGS: The right-sided power port is in good position, unchanged. The cardiac silhouette, mediastinal and hilar contours are within normal limits and stable. Stable mild tortuosity and calcification of the thoracic aorta. Emphysematous changes with fairly marked hyperinflation. Stable linear scarring type changes but no definite infiltrates or effusions. The bony thorax is intact. IMPRESSION: Emphysematous changes and pulmonary scarring but no definite acute overlying pulmonary process. Electronically Signed   By: Marijo Sanes M.D.   On: 10/17/2019 05:28    EKG: Independently reviewed.  Sinus rhythm PVCs  Assessment/Plan Principal Problem:   COPD with acute exacerbation (HCC) Active Problems:   Acute on chronic respiratory failure with hypoxia and hypercapnia (HCC)   PAD (peripheral artery disease) (HCC)   Hypertensive urgency     COPD with acute exacerbation Patient with a history of emphysema who presents for evaluation of worsening shortness of breath when compared to his baseline associated with a cough productive of clear phlegm Place patient on scheduled and as needed bronchodilator therapy Place patient on systemic and inhaled steroids   Acute hypercapnic respiratory failure Secondary to COPD exacerbation Patient was on noninvasive mechanical ventilation initially but has been weaned down to oxygen via nasal cannula  Will need to be assessed for home oxygen need prior to discharge  Hypertensive urgency Optimize blood pressure control Start patient on amlodipine 10 mg daily Add hydralazine 10 mg IV every 6 as needed for systolic blood pressure greater than 110mmHg   Peripheral arterial  disease Continue aspirin, Plavix and statins  DVT prophylaxis: Lovenox Code Status: Full Family Communication: Plan of care was discussed with patient at the bedside.  He verbalizes understanding and agrees with the plan Disposition Plan: Back to previous home environment Consults called: None    Kelcie Currie MD [facility]     10/17/2019, 7:30 AM

## 2019-10-17 NOTE — ED Notes (Signed)
Per lab pt's BMP from 5:00 am was hemolyzed, redrawn by this RN and sent to lab

## 2019-10-17 NOTE — ED Triage Notes (Signed)
Pt arrives [facility] from home w cc of shob, COPD exacerbation. EMS has been out 2 previous times and given duonebs.  18 G L AC 2g mag given 125 soumedrol given 2 duonebs Room air sats were mid 80s on EMS arrival, improved to 100% on nebs.

## 2019-10-17 NOTE — ED Notes (Signed)
Pt alert and oriented.  Unlabored even respirations. bipap removed and placed on 2 L [place]. RT aware. Bed bugs noted to pt.

## 2019-10-17 NOTE — ED Notes (Signed)
Pt given dinner tray.

## 2019-10-18 DIAGNOSIS — I160 Hypertensive urgency: Secondary | ICD-10-CM | POA: Diagnosis not present

## 2019-10-18 DIAGNOSIS — I739 Peripheral vascular disease, unspecified: Secondary | ICD-10-CM

## 2019-10-18 DIAGNOSIS — J9622 Acute and chronic respiratory failure with hypercapnia: Secondary | ICD-10-CM | POA: Diagnosis not present

## 2019-10-18 DIAGNOSIS — J441 Chronic obstructive pulmonary disease with (acute) exacerbation: Secondary | ICD-10-CM | POA: Diagnosis not present

## 2019-10-18 DIAGNOSIS — J9621 Acute and chronic respiratory failure with hypoxia: Secondary | ICD-10-CM | POA: Diagnosis not present

## 2019-10-18 LAB — BASIC METABOLIC PANEL
Anion gap: 8 (ref 5–15)
BUN: 23 mg/dL (ref 8–23)
CO2: 26 mmol/L (ref 22–32)
Calcium: 8.7 mg/dL — ABNORMAL LOW (ref 8.9–10.3)
Chloride: 105 mmol/L (ref 98–111)
Creatinine, Ser: 0.79 mg/dL (ref 0.61–1.24)
GFR calc Af Amer: >60 mL/min (ref >60)
GFR calc non Af Amer: >60 mL/min (ref >60)
Glucose, Bld: 120 mg/dL — ABNORMAL HIGH (ref 70–99)
Potassium: 4.4 mmol/L (ref 3.5–5.1)
Sodium: 139 mmol/L (ref 135–145)

## 2019-10-18 LAB — CBC
HCT: 33.7 % — ABNORMAL LOW (ref 39.0–52.0)
Hemoglobin: 10.8 g/dL — ABNORMAL LOW (ref 13.0–17.0)
MCH: 26.2 pg (ref 26.0–34.0)
MCHC: 32 g/dL (ref 30.0–36.0)
MCV: 81.6 fL (ref 80.0–100.0)
Platelets: 185 10*3/uL (ref 150–400)
RBC: 4.13 MIL/uL — ABNORMAL LOW (ref 4.22–5.81)
RDW: 15.6 % — ABNORMAL HIGH (ref 11.5–15.5)
WBC: 4.7 10*3/uL (ref 4.0–10.5)
nRBC: 0.4 % — ABNORMAL HIGH (ref 0.0–0.2)

## 2019-10-18 LAB — HIV ANTIBODY (ROUTINE TESTING W REFLEX): HIV Screen 4th Generation wRfx: NONREACTIVE

## 2019-10-18 MED ORDER — PREDNISONE 20 MG PO TABS: 40.0000 mg | ORAL_TABLET | Freq: Every day | ORAL | 0 refills | Status: DC

## 2019-10-18 MED ORDER — HYDROCOD POLST-CPM POLST ER 10-8 MG/5ML PO SUER
5.0000 mL | Freq: Two times a day (BID) | ORAL | Status: DC | PRN
  Administered 2019-10-18: 5 mL via ORAL
  Filled 2019-10-18: qty 5

## 2019-10-18 MED ORDER — ALBUTEROL SULFATE (2.5 MG/3ML) 0.083% IN NEBU: 2.5000 mg | INHALATION_SOLUTION | Freq: Four times a day (QID) | RESPIRATORY_TRACT | 0 refills | Status: DC | PRN

## 2019-10-18 NOTE — Discharge Instructions (Signed)
Chronic Obstructive Pulmonary Disease Chronic obstructive pulmonary disease (COPD) is a long-term (chronic) lung problem. When you have COPD, it is hard for air to get in and out of your lungs. Usually the condition gets worse over time, and your lungs will never return to normal. There are things you can do to keep yourself as healthy as possible.  Your doctor may treat your condition with: ? Medicines. ? Oxygen. ? Lung surgery.  Your doctor may also recommend: ? Rehabilitation. This includes steps to make your body work better. It may involve a team of specialists. ? Quitting smoking, if you smoke. ? Exercise and changes to your diet. ? Comfort measures (palliative care). Follow these instructions at home: Medicines  Take over-the-counter and prescription medicines only as told by your doctor.  Talk to your doctor before taking any cough or allergy medicines. You may need to avoid medicines that cause your lungs to be dry. Lifestyle  If you smoke, stop. Smoking makes the problem worse. If you need help quitting, ask your doctor.  Avoid being around things that make your breathing worse. This may include smoke, chemicals, and fumes.  Stay active, but remember to rest as well.  Learn and use tips on how to relax.  Make sure you get enough sleep. Most adults need at least 7 hours of sleep every night.  Eat healthy foods. Eat smaller meals more often. Rest before meals. Controlled breathing Learn and use tips on how to control your breathing as told by your doctor. Try:  Breathing in (inhaling) through your nose for 1 second. Then, pucker your lips and breath out (exhale) through your lips for 2 seconds.  Putting one hand on your belly (abdomen). Breathe in slowly through your nose for 1 second. Your hand on your belly should move out. Pucker your lips and breathe out slowly through your lips. Your hand on your belly should move in as you breathe out.  Controlled coughing Learn  and use controlled coughing to clear mucus from your lungs. Follow these steps: 1. Lean your head a little forward. 2. Breathe in deeply. 3. Try to hold your breath for 3 seconds. 4. Keep your mouth slightly open while coughing 2 times. 5. Spit any mucus out into a tissue. 6. Rest and do the steps again 1 or 2 times as needed. General instructions  Make sure you get all the shots (vaccines) that your doctor recommends. Ask your doctor about a flu shot and a pneumonia shot.  Use oxygen therapy and pulmonary rehabilitation if told by your doctor. If you need home oxygen therapy, ask your doctor if you should buy a tool to measure your oxygen level (oximeter).  Make a COPD action plan with your doctor. This helps you to know what to do if you feel worse than usual.  Manage any other conditions you have as told by your doctor.  Avoid going outside when it is very hot, cold, or humid.  Avoid people who have a sickness you can catch (contagious).  Keep all follow-up visits as told by your doctor. This is important. Contact a doctor if:  You cough up more mucus than usual.  There is a change in the color or thickness of the mucus.  It is harder to breathe than usual.  Your breathing is faster than usual.  You have trouble sleeping.  You need to use your medicines more often than usual.  You have trouble doing your normal activities such as getting dressed   or walking around the house. Get help right away if:  You have shortness of breath while resting.  You have shortness of breath that stops you from: ? Being able to talk. ? Doing normal activities.  Your chest hurts for longer than 5 minutes.  Your skin color is more blue than usual.  Your pulse oximeter shows that you have low oxygen for longer than 5 minutes.  You have a fever.  You feel too tired to breathe normally. Summary  Chronic obstructive pulmonary disease (COPD) is a long-term lung problem.  The way your  lungs work will never return to normal. Usually the condition gets worse over time. There are things you can do to keep yourself as healthy as possible.  Take over-the-counter and prescription medicines only as told by your doctor.  If you smoke, stop. Smoking makes the problem worse. This information is not intended to replace advice given to you by your health care provider. Make sure you discuss any questions you have with your health care provider. Document Revised: 06/12/2017 Document Reviewed: 08/04/2016 Elsevier Patient Education  2020 Elsevier Inc.  

## 2019-10-18 NOTE — Progress Notes (Signed)
PT Cancellation Note  Patient Details Name: Carl Bell MRN: [id number] DOB: 09-May-1951   Cancelled Treatment:    Reason Eval/Treat Not Completed: PT screened, no needs identified, will sign off(Consult received and chart reviewed.  Per discussion with attending physician, patient at baseline level of ability; no acute PT needs identified at this time. Will re-consult should needs arise.)   Myrl Bynum H. Owens Shark, PT, DPT, NCS 10/18/19, 1:46 PM [phone]

## 2019-10-18 NOTE — TOC Initial Note (Signed)
Transition of Care [facility]) - Initial/Assessment Note    Patient Details  Name: Carl Bell MRN: [id number] Date of Birth: April 02, 1951  Transition of [facility]) CM/SW Contact:    Beverly Sessions, RN Phone Number: 10/18/2019, 2:34 PM  Clinical Narrative:                 Patient to discharge home today Patient states that he lives at home with his mother  PCP Ouida Sills.  Denies issues with transportation or obtaining medications   Patient confirms he has a working nebulizer at home   Expected Discharge Plan: Home/Self Care Barriers to Discharge: No Barriers Identified   Patient Goals and CMS Choice        Expected Discharge Plan and Services Expected Discharge Plan: Home/Self Care         Expected Discharge Date: 10/18/19                                    Prior Living Arrangements/Services                       Activities of Daily Living Home Assistive Devices/Equipment: None ADL Screening (condition at time of admission) Patient's cognitive ability adequate to safely complete daily activities?: No Is the patient deaf or have difficulty hearing?: No Does the patient have difficulty seeing, even when wearing glasses/contacts?: No Does the patient have difficulty concentrating, remembering, or making decisions?: No Patient able to express need for assistance with ADLs?: Yes Does the patient have difficulty dressing or bathing?: No Independently performs ADLs?: Yes (appropriate for developmental age) Does the patient have difficulty walking or climbing stairs?: No Weakness of Legs: None Weakness of Arms/Hands: None  Permission Sought/Granted                  Emotional Assessment              Admission diagnosis:  COPD exacerbation ([place]) [J44.1] Acute respiratory failure with hypoxia ([place]) [J96.01] COPD with acute exacerbation ([place]) [J44.1] Acute exacerbation of chronic obstructive pulmonary disease (COPD) ([place]) [J44.1] Patient  Active Problem List   Diagnosis Date Noted  . Acute exacerbation of chronic obstructive pulmonary disease (COPD) ([place]) 10/18/2019  . Hypertensive urgency 10/17/2019  . COPD with acute exacerbation ([place]) 09/12/2019  . Acute on chronic respiratory failure with hypoxia and hypercapnia ([place]) 09/12/2019  . Tobacco abuse 09/12/2019  . PAD (peripheral artery disease) ([place]) 09/12/2019  . HLD (hyperlipidemia) 09/12/2019  . HTN (hypertension) 09/12/2019  . Atherosclerosis of native arteries of the extremities with ulceration ([place]) 06/28/2019  . Sepsis ([place]) 12/24/2017  . Protein-calorie malnutrition, severe 11/12/2017  . Acute respiratory failure ([place]) 11/10/2017  . Acute respiratory distress 12/22/2015  . COPD (chronic obstructive pulmonary disease) ([place]) 12/22/2015  . Acute bronchitis 12/22/2015  . HTN, goal below 140/80 12/22/2015  . Lung cancer, lingula ([place]) 09/23/2015  . Chronic edema 09/15/2014   PCP:  Kirk Ruths, MD Pharmacy:   [facility], [address] [place] [address] Phone: [phone] Fax: [phone]     Social Determinants of Health (SDOH) Interventions    Readmission Risk Interventions No flowsheet data found.

## 2019-10-18 NOTE — Discharge Summary (Signed)
Physician Discharge Summary  Carl Bell:[id number] DOB: [birth year] DOA: 10/17/2019  PCP: Kirk Ruths, MD  Admit date: 10/17/2019 Discharge date: 10/18/2019  Admitted From: Home Disposition: Home   Recommendations for Outpatient Follow-up:  1. Follow up with PCP in 1-2 weeks  Home Health: None Equipment/Devices: Nebulizer machine Discharge Condition: Stable CODE STATUS: Full Diet recommendation: Heart healthy  Brief/Interim Summary: Carl Bell is a 69 y.o. male with medical history significant for lung cancer status post chemo and radiation therapy, COPD, peripheral arterial disease and hypertension who presents to the emergency room for evaluation  of shortness of breath that progressively worsened over the last 24 hours.  He was initially short of breath with exertion but is now short of breath at rest.  Shortness of breath is associated with cough productive of clear phlegm. He denies having any chest pain, fever, chills, nausea or vomiting, no orthopnea, no dizziness, no headaches or lightheadedness When EMS arrived patient was found to have a pulse oximetry of 85%.  He was noted to have diffuse wheezes and received Solu-Medrol 125 mg in the field as well as IV magnesium and bronchodilator therapy. Chest x ray showed emphysematous changes and pulmonary scarring but no definite acute overlying pulmonary process. Arterial blood gas showed uncompensated respiratory acidosis and patient was placed on BiPAP  ED Course: Patient presents with respiratory distress, acute hypoxic respiratory failure. In addition to medications already administered by EMS, he received additional 2 albuterol nebs. He was placed on BiPAP on arrival for respiratory support given his hypoxia and markedly increased work of breathing. Patient was admitted for further stabilization.   Hospital Course: Significantly improved, opened up with steroids and nebs which will continue at home.   Discharge  Diagnoses:  Principal Problem:   COPD with acute exacerbation ([place]) Active Problems:   Acute on chronic respiratory failure with hypoxia and hypercapnia (HCC)   PAD (peripheral artery disease) (H[facility])   Hypertensive urgency   Acute exacerbation of chronic obstructive pulmonary disease (COPD) (HCC)  Acute hypercapnic respiratory failure secondary to acute COPD exacerbation: Patient was on noninvasive mechanical ventilation initially but has been weaned down to oxygen via nasal cannula and ultimately off oxygen with normalization of respiratory effort.  - Will continue bronchodilators and steroids. Nebulizer machine and meds ordered, facilitated by TOC.  - Needs follow up to measure response, for ongoing management.  Hypertensive urgency: Improved. Restart home medications and follow up with PCP.   Peripheral arterial disease Continue aspirin, Plavix and statins  Discharge Instructions Discharge Instructions    Diet - low sodium heart healthy   Complete by: As directed    Discharge instructions   Complete by: As directed    Continue taking prednisone daily for 4 more days Continue using albuterol inhaler and if needed, albuterol nebulizer (solution prescription sent to your pharmacy) Follow up with your doctor in the next 1-2 weeks or seek medical attention sooner if your symptoms return   Increase activity slowly   Complete by: As directed      Allergies as of 10/18/2019   No Known Allergies     Medication List    TAKE these medications   albuterol 108 (90 Base) MCG/ACT inhaler Commonly known as: VENTOLIN HFA Inhale 2 puffs into the lungs every 6 (six) hours as needed for wheezing or shortness of breath. What changed: Another medication with the same name was added. Make sure you understand how and when to take each.   albuterol (2.5 MG/3ML)  0.083% nebulizer solution Commonly known as: PROVENTIL Take 3 mLs (2.5 mg total) by nebulization every 6 (six) hours as needed for  wheezing or shortness of breath. What changed: You were already taking a medication with the same name, and this prescription was added. Make sure you understand how and when to take each.   aspirin EC 81 MG tablet Take 1 tablet (81 mg total) by mouth daily.   atorvastatin 10 MG tablet Commonly known as: Lipitor Take 1 tablet (10 mg total) by mouth daily.   clopidogrel 75 MG tablet Commonly known as: Plavix Take 1 tablet (75 mg total) by mouth daily.   Combigan 0.2-0.5 % ophthalmic solution Generic drug: brimonidine-timolol Place 1 drop into both eyes 2 (two) times daily.   nicotine 21 mg/24hr patch Commonly known as: NICODERM CQ - dosed in mg/24 hours Place 1 patch (21 mg total) onto the skin daily.   OMEGA-3 FISH OIL PO Take 1 capsule by mouth daily.   predniSONE 20 MG tablet Commonly known as: DELTASONE Take 2 tablets (40 mg total) by mouth daily with breakfast.   Travatan Z 0.004 % Soln ophthalmic solution Generic drug: Travoprost (BAK Free) Place 1 drop into both eyes at bedtime.   triamcinolone cream 0.1 % Commonly known as: KENALOG Apply topically 2 (two) times daily.   varenicline 0.5 MG X 11 & 1 MG X 42 tablet Commonly known as: CHANTIX PAK Follow package directions.   Vitamin D-3 125 MCG (5000 UT) Tabs Take 5,000 Units by mouth daily.      Follow-up Information    Kirk Ruths, MD. Go on 10/20/2019.   Specialty: Internal Medicine Why: 10:15am Contact information: [place] [address] [phone]          No Known Allergies  Consultations:  None  Procedures/Studies: DG Chest Portable 1 View  Result Date: 10/17/2019 CLINICAL DATA:  Shortness of breath. EXAM: PORTABLE CHEST 1 VIEW COMPARISON:  09/12/2019 FINDINGS: The right-sided power port is in good position, unchanged. The cardiac silhouette, mediastinal and hilar contours are within normal limits and stable. Stable mild tortuosity and  calcification of the thoracic aorta. Emphysematous changes with fairly marked hyperinflation. Stable linear scarring type changes but no definite infiltrates or effusions. The bony thorax is intact. IMPRESSION: Emphysematous changes and pulmonary scarring but no definite acute overlying pulmonary process. Electronically Signed   By: Marijo Sanes M.D.   On: 10/17/2019 05:28    Subjective: Feels much better, breathing at baseline, walking around the room without dyspnea. No wheezing or chest pain.  Discharge Exam: Vitals:   10/17/19 2019 10/18/19 0635  BP: (!) 138/96 123/83  Pulse: 80 66  Resp: 18   Temp: 97.6 F (36.4 C) (!) 97.4 F (36.3 C)  SpO2: 100% 99%   General: Pt is alert, awake, not in acute distress Cardiovascular: RRR, S1/S2 +, no rubs, no gallops Respiratory: CTA bilaterally, no wheezing, no rhonchi Abdominal: Soft, NT, ND, bowel sounds + Extremities: No edema, no cyanosis  Labs: BNP (last 3 results) Recent Labs    10/30/18 1941 03/10/19 1455  BNP 53.0 09.3   Basic Metabolic Panel: Recent Labs  Lab 10/17/19 1158 10/18/19 0437  NA 139 139  K 4.4 4.4  CL 106 105  CO2 27 26  GLUCOSE 149* 120*  BUN 18 23  CREATININE 0.83 0.79  CALCIUM 9.0 8.7*   Liver Function Tests: No results for input(s): AST, ALT, ALKPHOS, BILITOT, PROT, ALBUMIN  in the last 168 hours. No results for input(s): LIPASE, AMYLASE in the last 168 hours. No results for input(s): AMMONIA in the last 168 hours. CBC: Recent Labs  Lab 10/17/19 0451 10/18/19 0437  WBC 3.9* 4.7  NEUTROABS 1.9  --   HGB 12.3* 10.8*  HCT 38.8* 33.7*  MCV 82.9 81.6  PLT 206 185   Cardiac Enzymes: No results for input(s): CKTOTAL, CKMB, CKMBINDEX, TROPONINI in the last 168 hours. BNP: Invalid input(s): POCBNP CBG: No results for input(s): GLUCAP in the last 168 hours. D-Dimer No results for input(s): DDIMER in the last 72 hours. Hgb A1c No results for input(s): HGBA1C in the last 72 hours. Lipid  Profile No results for input(s): CHOL, HDL, LDLCALC, TRIG, CHOLHDL, LDLDIRECT in the last 72 hours. Thyroid function studies No results for input(s): TSH, T4TOTAL, T3FREE, THYROIDAB in the last 72 hours.  Invalid input(s): FREET3 Anemia work up No results for input(s): VITAMINB12, FOLATE, FERRITIN, TIBC, IRON, RETICCTPCT in the last 72 hours. Urinalysis    Component Value Date/Time   COLORURINE YELLOW (A) 12/24/2017 0914   APPEARANCEUR HAZY (A) 12/24/2017 0914   LABSPEC 1.019 12/24/2017 0914   PHURINE 5.0 12/24/2017 0914   GLUCOSEU NEGATIVE 12/24/2017 0914   HGBUR NEGATIVE 12/24/2017 0914   BILIRUBINUR NEGATIVE 12/24/2017 0914   KETONESUR 5 (A) 12/24/2017 0914   PROTEINUR NEGATIVE 12/24/2017 0914   NITRITE NEGATIVE 12/24/2017 0914   LEUKOCYTESUR SMALL (A) 12/24/2017 0914    Microbiology Recent Results (from the past 240 hour(s))  Respiratory Panel by RT PCR (Flu A&B, Covid) - Nasopharyngeal Swab     Status: None   Collection Time: 10/17/19  4:51 AM   Specimen: Nasopharyngeal Swab  Result Value Ref Range Status   SARS Coronavirus 2 by RT PCR NEGATIVE NEGATIVE Final    Comment: (NOTE) SARS-CoV-2 target nucleic acids are NOT DETECTED. The SARS-CoV-2 RNA is generally detectable in upper respiratoy specimens during the acute phase of infection. The lowest concentration of SARS-CoV-2 viral copies this assay can detect is 131 copies/mL. A negative result does not preclude SARS-Cov-2 infection and should not be used as the sole basis for treatment or other patient management decisions. A negative result may occur with  improper specimen collection/handling, submission of specimen other than nasopharyngeal swab, presence of viral mutation(s) within the areas targeted by this assay, and inadequate number of viral copies (<131 copies/mL). A negative result must be combined with clinical observations, patient history, and epidemiological information. The expected result is  Negative. Fact Sheet for Patients:  PinkCheek.be Fact Sheet for Healthcare Providers:  GravelBags.it This test is not yet ap proved or cleared by the [place] FDA and  has been authorized for detection and/or diagnosis of SARS-CoV-2 by FDA under an Emergency Use Authorization (EUA). This EUA will remain  in effect (meaning this test can be used) for the duration of the COVID-19 declaration under Section 564(b)(1) of the Act, 21 U.S.C. section 360bbb-3(b)(1), unless the authorization is terminated or revoked sooner.    Influenza A by PCR NEGATIVE NEGATIVE Final   Influenza B by PCR NEGATIVE NEGATIVE Final    Comment: (NOTE) The Xpert Xpress SARS-CoV-2/FLU/RSV assay is intended as an aid in  the diagnosis of influenza from Nasopharyngeal swab specimens and  should not be used as a sole basis for treatment. Nasal washings and  aspirates are unacceptable for Xpert Xpress SARS-CoV-2/FLU/RSV  testing. Fact Sheet for Patients: PinkCheek.be Fact Sheet for Healthcare Providers: GravelBags.it This test is not yet approved  or cleared by the [place] and  has been authorized for detection and/or diagnosis of SARS-CoV-2 by  FDA under an Emergency Use Authorization (EUA). This EUA will remain  in effect (meaning this test can be used) for the duration of the  Covid-19 declaration under Section 564(b)(1) of the Act, 21  U.S.C. section 360bbb-3(b)(1), unless the authorization is  terminated or revoked. Performed at [facility], [address] [place], [place] [address]     Time coordinating discharge: Approximately 40 minutes  Patrecia Pour, MD  [facility] 10/21/2019, 6:42 PM

## 2019-10-18 NOTE — Care Management Obs Status (Signed)
[place] NOTIFICATION   Patient Details  Name: GUSTAF MCCARTER MRN: [id number] Date of Birth: [birth year]   Medicare Observation Status Notification Given:  Yes    Beverly Sessions, RN 10/18/2019, 12:31 PM

## 2019-10-18 NOTE — Care Management CC44 (Signed)
Condition Code 44 Documentation Completed  Patient Details  Name: Carl Bell MRN: [id number] Date of Birth: 08/07/1950   Condition Code 44 given:  Yes Patient signature on Condition Code 44 notice:  Yes Documentation of 2 MD's agreement:  Yes Code 44 added to claim:  Yes    Beverly Sessions, RN 10/18/2019, 12:31 PM

## 2019-10-18 NOTE — Progress Notes (Signed)
Patient ambulated independently in room and to bathroom, denies shortness of breath.  Patient oxygenation 99% sitting and 96% while ambulating.  Patient states he has a nebulizer machine at home and verbalized he knew how to use it.  Discharge instructions reviewed as well as medication changes.  Patient denies needing any additional items nor does he have questions regarding discharge.  Patient PIV removed and awaiting family to pick him up to transport home.

## 2019-10-20 DIAGNOSIS — Z1211 Encounter for screening for malignant neoplasm of colon: Secondary | ICD-10-CM | POA: Diagnosis not present

## 2019-10-20 DIAGNOSIS — J449 Chronic obstructive pulmonary disease, unspecified: Secondary | ICD-10-CM | POA: Diagnosis not present

## 2019-10-20 DIAGNOSIS — I700 Atherosclerosis of aorta: Secondary | ICD-10-CM | POA: Diagnosis not present

## 2019-10-20 DIAGNOSIS — E441 Mild protein-calorie malnutrition: Secondary | ICD-10-CM | POA: Diagnosis not present

## 2019-10-31 ENCOUNTER — Other Ambulatory Visit (INDEPENDENT_AMBULATORY_CARE_PROVIDER_SITE_OTHER): Payer: Self-pay | Admitting: Nurse Practitioner

## 2019-10-31 DIAGNOSIS — Z95820 Peripheral vascular angioplasty status with implants and grafts: Secondary | ICD-10-CM

## 2019-10-31 DIAGNOSIS — I70249 Atherosclerosis of native arteries of left leg with ulceration of unspecified site: Secondary | ICD-10-CM

## 2019-11-01 ENCOUNTER — Ambulatory Visit (INDEPENDENT_AMBULATORY_CARE_PROVIDER_SITE_OTHER): Payer: Medicare HMO | Admitting: Nurse Practitioner

## 2019-11-01 ENCOUNTER — Encounter (INDEPENDENT_AMBULATORY_CARE_PROVIDER_SITE_OTHER): Payer: Medicare HMO

## 2019-11-08 ENCOUNTER — Observation Stay
Admission: EM | Admit: 2019-11-08 | Discharge: 2019-11-09 | Disposition: A | Discharge status: Home / Self Care | Payer: Medicare HMO | Attending: Internal Medicine | Admitting: Internal Medicine

## 2019-11-08 ENCOUNTER — Other Ambulatory Visit: Payer: Self-pay

## 2019-11-08 ENCOUNTER — Encounter: Payer: Self-pay | Admitting: Emergency Medicine

## 2019-11-08 ENCOUNTER — Emergency Department: Payer: Medicare HMO

## 2019-11-08 DIAGNOSIS — R0902 Hypoxemia: Secondary | ICD-10-CM | POA: Diagnosis not present

## 2019-11-08 DIAGNOSIS — Z8249 Family history of ischemic heart disease and other diseases of the circulatory system: Secondary | ICD-10-CM | POA: Insufficient documentation

## 2019-11-08 DIAGNOSIS — J440 Chronic obstructive pulmonary disease with acute lower respiratory infection: Secondary | ICD-10-CM | POA: Diagnosis not present

## 2019-11-08 DIAGNOSIS — J439 Emphysema, unspecified: Secondary | ICD-10-CM | POA: Diagnosis not present

## 2019-11-08 DIAGNOSIS — Z7902 Long term (current) use of antithrombotics/antiplatelets: Secondary | ICD-10-CM | POA: Insufficient documentation

## 2019-11-08 DIAGNOSIS — Z7982 Long term (current) use of aspirin: Secondary | ICD-10-CM | POA: Diagnosis not present

## 2019-11-08 DIAGNOSIS — J984 Other disorders of lung: Secondary | ICD-10-CM | POA: Diagnosis not present

## 2019-11-08 DIAGNOSIS — J209 Acute bronchitis, unspecified: Secondary | ICD-10-CM

## 2019-11-08 DIAGNOSIS — Z923 Personal history of irradiation: Secondary | ICD-10-CM | POA: Insufficient documentation

## 2019-11-08 DIAGNOSIS — R000 Tachycardia, unspecified: Secondary | ICD-10-CM | POA: Diagnosis not present

## 2019-11-08 DIAGNOSIS — H409 Unspecified glaucoma: Secondary | ICD-10-CM | POA: Diagnosis not present

## 2019-11-08 DIAGNOSIS — R0602 Shortness of breath: Secondary | ICD-10-CM | POA: Diagnosis not present

## 2019-11-08 DIAGNOSIS — Z9221 Personal history of antineoplastic chemotherapy: Secondary | ICD-10-CM | POA: Insufficient documentation

## 2019-11-08 DIAGNOSIS — F17210 Nicotine dependence, cigarettes, uncomplicated: Secondary | ICD-10-CM | POA: Insufficient documentation

## 2019-11-08 DIAGNOSIS — I10 Essential (primary) hypertension: Secondary | ICD-10-CM | POA: Insufficient documentation

## 2019-11-08 DIAGNOSIS — E785 Hyperlipidemia, unspecified: Secondary | ICD-10-CM | POA: Insufficient documentation

## 2019-11-08 DIAGNOSIS — Z85118 Personal history of other malignant neoplasm of bronchus and lung: Secondary | ICD-10-CM | POA: Insufficient documentation

## 2019-11-08 DIAGNOSIS — Z79899 Other long term (current) drug therapy: Secondary | ICD-10-CM | POA: Insufficient documentation

## 2019-11-08 DIAGNOSIS — Z20822 Contact with and (suspected) exposure to covid-19: Secondary | ICD-10-CM | POA: Insufficient documentation

## 2019-11-08 DIAGNOSIS — J441 Chronic obstructive pulmonary disease with (acute) exacerbation: Secondary | ICD-10-CM

## 2019-11-08 DIAGNOSIS — J9601 Acute respiratory failure with hypoxia: Secondary | ICD-10-CM | POA: Diagnosis not present

## 2019-11-08 LAB — BASIC METABOLIC PANEL
Anion gap: 15 (ref 5–15)
BUN: 14 mg/dL (ref 8–23)
CO2: 22 mmol/L (ref 22–32)
Calcium: 9.2 mg/dL (ref 8.9–10.3)
Chloride: 100 mmol/L (ref 98–111)
Creatinine, Ser: 0.61 mg/dL (ref 0.61–1.24)
GFR calc Af Amer: >60 mL/min (ref >60)
GFR calc non Af Amer: >60 mL/min (ref >60)
Glucose, Bld: 91 mg/dL (ref 70–99)
Potassium: 4 mmol/L (ref 3.5–5.1)
Sodium: 137 mmol/L (ref 135–145)

## 2019-11-08 LAB — BRAIN NATRIURETIC PEPTIDE: B Natriuretic Peptide: 174 pg/mL — ABNORMAL HIGH (ref 0.0–100.0)

## 2019-11-08 LAB — CBC
HCT: 35.9 % — ABNORMAL LOW (ref 39.0–52.0)
Hemoglobin: 11.8 g/dL — ABNORMAL LOW (ref 13.0–17.0)
MCH: 26.5 pg (ref 26.0–34.0)
MCHC: 32.9 g/dL (ref 30.0–36.0)
MCV: 80.5 fL (ref 80.0–100.0)
Platelets: 158 10*3/uL (ref 150–400)
RBC: 4.46 MIL/uL (ref 4.22–5.81)
RDW: 16.3 % — ABNORMAL HIGH (ref 11.5–15.5)
WBC: 6 10*3/uL (ref 4.0–10.5)
nRBC: 0 % (ref 0.0–0.2)

## 2019-11-08 LAB — TROPONIN I (HIGH SENSITIVITY)
Troponin I (High Sensitivity): 8 ng/L (ref <18)
Troponin I (High Sensitivity): 7 ng/L (ref <18)

## 2019-11-08 MED ORDER — ATORVASTATIN CALCIUM 10 MG PO TABS
10.0000 mg | ORAL_TABLET | Freq: Every day | ORAL | Status: DC
  Administered 2019-11-08 – 2019-11-09 (×2): 10 mg via ORAL
  Filled 2019-11-08 (×2): qty 1

## 2019-11-08 MED ORDER — ACETAMINOPHEN 325 MG PO TABS: 650.0000 mg | ORAL_TABLET | Freq: Four times a day (QID) | ORAL | Status: DC | PRN

## 2019-11-08 MED ORDER — OMEGA-3-ACID ETHYL ESTERS 1 G PO CAPS
1.0000 | ORAL_CAPSULE | Freq: Every day | ORAL | Status: DC
  Administered 2019-11-08 – 2019-11-09 (×2): 1 g via ORAL
  Filled 2019-11-08 (×2): qty 1

## 2019-11-08 MED ORDER — IPRATROPIUM-ALBUTEROL 0.5-2.5 (3) MG/3ML IN SOLN
3.0000 mL | Freq: Once | RESPIRATORY_TRACT | Status: AC
  Administered 2019-11-08: 3 mL via RESPIRATORY_TRACT

## 2019-11-08 MED ORDER — VITAMIN D 25 MCG (1000 UNIT) PO TABS
5000.0000 [IU] | ORAL_TABLET | Freq: Every day | ORAL | Status: DC
  Administered 2019-11-08 – 2019-11-09 (×2): 5000 [IU] via ORAL
  Filled 2019-11-08 (×2): qty 5

## 2019-11-08 MED ORDER — BRIMONIDINE TARTRATE-TIMOLOL 0.2-0.5 % OP SOLN: 1.0000 [drp] | Freq: Two times a day (BID) | OPHTHALMIC | Status: DC

## 2019-11-08 MED ORDER — LATANOPROST 0.005 % OP SOLN
1.0000 [drp] | Freq: Every day | OPHTHALMIC | Status: DC
  Administered 2019-11-08: 1 [drp] via OPHTHALMIC
  Filled 2019-11-08: qty 2.5

## 2019-11-08 MED ORDER — CLOPIDOGREL BISULFATE 75 MG PO TABS
75.0000 mg | ORAL_TABLET | Freq: Every day | ORAL | Status: DC
  Administered 2019-11-08 – 2019-11-09 (×2): 75 mg via ORAL
  Filled 2019-11-08 (×2): qty 1

## 2019-11-08 MED ORDER — SODIUM CHLORIDE 0.9 % IV SOLN
500.0000 mg | INTRAVENOUS | Status: DC
  Administered 2019-11-08: 500 mg via INTRAVENOUS
  Filled 2019-11-08 (×2): qty 500

## 2019-11-08 MED ORDER — ALBUTEROL SULFATE HFA 108 (90 BASE) MCG/ACT IN AERS: 2.0000 | INHALATION_SPRAY | Freq: Four times a day (QID) | RESPIRATORY_TRACT | Status: DC | PRN

## 2019-11-08 MED ORDER — ASPIRIN EC 81 MG PO TBEC
81.0000 mg | DELAYED_RELEASE_TABLET | Freq: Every day | ORAL | Status: DC
Start: 2019-11-08 — End: 2019-11-08

## 2019-11-08 MED ORDER — SODIUM CHLORIDE 0.9 % IV SOLN: Freq: Once | INTRAVENOUS | Status: AC

## 2019-11-08 MED ORDER — NICOTINE 21 MG/24HR TD PT24
21.0000 mg | MEDICATED_PATCH | Freq: Every day | TRANSDERMAL | Status: DC
  Administered 2019-11-08 – 2019-11-09 (×2): 21 mg via TRANSDERMAL
  Filled 2019-11-08 (×2): qty 1

## 2019-11-08 MED ORDER — BRIMONIDINE TARTRATE 0.2 % OP SOLN
1.0000 [drp] | Freq: Two times a day (BID) | OPHTHALMIC | Status: DC
  Administered 2019-11-08 – 2019-11-09 (×2): 1 [drp] via OPHTHALMIC
  Filled 2019-11-08: qty 5

## 2019-11-08 MED ORDER — SODIUM CHLORIDE 0.9 % IV SOLN: INTRAVENOUS | Status: DC

## 2019-11-08 MED ORDER — METHYLPREDNISOLONE SODIUM SUCC 40 MG IJ SOLR
40.0000 mg | Freq: Three times a day (TID) | INTRAMUSCULAR | Status: DC
  Administered 2019-11-09 (×2): 40 mg via INTRAVENOUS
  Filled 2019-11-08 (×2): qty 1

## 2019-11-08 MED ORDER — ONDANSETRON HCL 4 MG PO TABS: 4.0000 mg | ORAL_TABLET | Freq: Four times a day (QID) | ORAL | Status: DC | PRN

## 2019-11-08 MED ORDER — IPRATROPIUM-ALBUTEROL 0.5-2.5 (3) MG/3ML IN SOLN
3.0000 mL | Freq: Once | RESPIRATORY_TRACT | Status: AC
  Administered 2019-11-08: 3 mL via RESPIRATORY_TRACT
  Filled 2019-11-08: qty 9

## 2019-11-08 MED ORDER — HYDROCOD POLST-CPM POLST ER 10-8 MG/5ML PO SUER
5.0000 mL | Freq: Two times a day (BID) | ORAL | Status: DC | PRN
  Administered 2019-11-08: 5 mL via ORAL
  Filled 2019-11-08: qty 5

## 2019-11-08 MED ORDER — METHYLPREDNISOLONE SODIUM SUCC 125 MG IJ SOLR
125.0000 mg | Freq: Once | INTRAMUSCULAR | Status: AC
  Administered 2019-11-08: 125 mg via INTRAVENOUS
  Filled 2019-11-08: qty 2

## 2019-11-08 MED ORDER — ONDANSETRON HCL 4 MG/2ML IJ SOLN: 4.0000 mg | Freq: Four times a day (QID) | INTRAMUSCULAR | Status: DC | PRN

## 2019-11-08 MED ORDER — TIMOLOL MALEATE 0.5 % OP SOLN
1.0000 [drp] | Freq: Two times a day (BID) | OPHTHALMIC | Status: DC
  Administered 2019-11-08 – 2019-11-09 (×2): 1 [drp] via OPHTHALMIC
  Filled 2019-11-08: qty 5

## 2019-11-08 MED ORDER — TRAZODONE HCL 50 MG PO TABS: 25.0000 mg | ORAL_TABLET | Freq: Every evening | ORAL | Status: DC | PRN

## 2019-11-08 MED ORDER — IPRATROPIUM-ALBUTEROL 0.5-2.5 (3) MG/3ML IN SOLN
3.0000 mL | Freq: Four times a day (QID) | RESPIRATORY_TRACT | Status: DC
  Administered 2019-11-08 – 2019-11-09 (×4): 3 mL via RESPIRATORY_TRACT
  Filled 2019-11-08 (×5): qty 3

## 2019-11-08 MED ORDER — ALBUTEROL SULFATE (2.5 MG/3ML) 0.083% IN NEBU: 2.5000 mg | INHALATION_SOLUTION | Freq: Four times a day (QID) | RESPIRATORY_TRACT | Status: DC | PRN

## 2019-11-08 MED ORDER — ASPIRIN EC 81 MG PO TBEC
81.0000 mg | DELAYED_RELEASE_TABLET | Freq: Every day | ORAL | Status: DC
  Administered 2019-11-08 – 2019-11-09 (×2): 81 mg via ORAL
  Filled 2019-11-08 (×2): qty 1

## 2019-11-08 MED ORDER — ENOXAPARIN SODIUM 40 MG/0.4ML ~~LOC~~ SOLN
40.0000 mg | SUBCUTANEOUS | Status: DC
  Administered 2019-11-08: 40 mg via SUBCUTANEOUS
  Filled 2019-11-08: qty 0.4

## 2019-11-08 MED ORDER — ACETAMINOPHEN 650 MG RE SUPP: 650.0000 mg | Freq: Four times a day (QID) | RECTAL | Status: DC | PRN

## 2019-11-08 MED ORDER — SODIUM CHLORIDE 0.9 % IV SOLN
1.0000 g | INTRAVENOUS | Status: DC
  Administered 2019-11-08: 1 g via INTRAVENOUS
  Filled 2019-11-08: qty 10
  Filled 2019-11-08: qty 1

## 2019-11-08 MED ORDER — GUAIFENESIN ER 600 MG PO TB12
600.0000 mg | ORAL_TABLET | Freq: Two times a day (BID) | ORAL | Status: DC
  Administered 2019-11-08 – 2019-11-09 (×2): 600 mg via ORAL
  Filled 2019-11-08 (×2): qty 1

## 2019-11-08 MED ORDER — MAGNESIUM HYDROXIDE 400 MG/5ML PO SUSP: 30.0000 mL | Freq: Every day | ORAL | Status: DC | PRN

## 2019-11-08 NOTE — H&P (Signed)
[facility] NAME: Carl Bell    MR#:  [id number]  DATE OF BIRTH:  January 16, 1951  DATE OF ADMISSION:  11/08/2019  PRIMARY CARE PHYSICIAN: Kirk Ruths, MD   REQUESTING/REFERRING PHYSICIAN: Duffy Bruce, MD  CHIEF COMPLAINT:   Chief Complaint  Patient presents with  . Hypertension    HISTORY OF PRESENT ILLNESS:  Carl Bell  is a 69 y.o. African-American male with a known history of COPD, hypertension, lung cancer and glaucoma, presented to the emergency room with acute onset of worsening dyspnea with associated cough that is mainly dry and wheezing since yesterday. He was recently admitted here for COPD exacerbation on 4/5 and discharged next day. He has been doing fairly well after discharge to the yesterday. He ran out of his albuterol however and has been using nebulizer machine from his friend. He was noted to have pulse currently of 88% on room air at rest. He denied any fever or chills. No chest pain or palpitations. Hemoptysis. No other bleeding diathesis. No COVID-19 exposure.  Upon presentation to the emergency room, blood pressure was 164/90 with respiratory to 24 and pulse currently was 90% on 4 L of O2 by nasal cannula. Labs revealed a BNP of 174, high-sensitivity troponin I of 8 and later 7 with mild anemia on his CBC and unremarkable BMP. Two-view chest x-ray showed advanced emphysema with linear scarring in the left midlung with no acute cardiopulmonary disease. COVID-19 PCR is currently pending.  The patient was given duo nebs x3 and Solu-Medrol 125 mg IV. He will be admitted to an observation medical monitored bed for further evaluation and management. PAST MEDICAL HISTORY:   Past Medical History:  Diagnosis Date  . COPD (chronic obstructive pulmonary disease) ([place])   . Emphysema of lung ([place])   . Glaucoma   . Hypertension   . Lung cancer [facility])   Status post chemotherapy and radiotherapy in 2017 with no  recurrence.  PAST SURGICAL HISTORY:   Past Surgical History:  Procedure Laterality Date  . LOWER EXTREMITY ANGIOGRAPHY Left 07/11/2019   Procedure: LOWER EXTREMITY ANGIOGRAPHY;  Surgeon: Algernon Huxley, MD;  Location: [facility];  Service: Cardiovascular;  Laterality: Left;  . LUNG BIOPSY    . PERIPHERAL VASCULAR CATHETERIZATION N/A 09/24/2015   Procedure: [name];  Surgeon: Algernon Huxley, MD;  Location: [facility];  Service: Cardiovascular;  Laterality: N/A;  . Port a cath placement      SOCIAL HISTORY:   Social History   Tobacco Use  . Smoking status: Current Every Day Smoker    Packs/day: 0.50    Years: 50.00    Pack years: 25.00    Types: Cigarettes  . Smokeless tobacco: Never Used  Substance Use Topics  . Alcohol use: Yes    Alcohol/week: 0.0 standard drinks    Comment: 3 pints/week    FAMILY HISTORY:   Family History  Problem Relation Age of Onset  . Diabetes Mellitus II Mother   . Hypertension Father     DRUG ALLERGIES:  No Known Allergies  REVIEW OF SYSTEMS:   ROS As per history of present illness. All pertinent systems were reviewed above. Constitutional,  HEENT, cardiovascular, respiratory, GI, GU, musculoskeletal, neuro, psychiatric, endocrine,  integumentary and hematologic systems were reviewed and are otherwise  negative/unremarkable except for positive findings mentioned above in the HPI.   MEDICATIONS AT HOME:   Prior to Admission medications   Medication Sig  Start Date End Date Taking? Authorizing Provider  albuterol (PROVENTIL) (2.5 MG/3ML) 0.083% nebulizer solution Take 3 mLs (2.5 mg total) by nebulization every 6 (six) hours as needed for wheezing or shortness of breath. 10/18/19  Yes Patrecia Pour, MD  albuterol (VENTOLIN HFA) 108 (90 Base) MCG/ACT inhaler Inhale 2 puffs into the lungs every 6 (six) hours as needed for wheezing or shortness of breath. 09/13/19  Yes Sreenath, Sudheer B, MD  aspirin EC 81 MG tablet  Take 1 tablet (81 mg total) by mouth daily. 07/11/19  Yes Dew, Erskine Squibb, MD  atorvastatin (LIPITOR) 10 MG tablet Take 1 tablet (10 mg total) by mouth daily. 07/11/19 07/10/20 Yes Dew, Erskine Squibb, MD  Cholecalciferol (VITAMIN D-3) 125 MCG (5000 UT) TABS Take 5,000 Units by mouth daily.   Yes [provider]  clopidogrel (PLAVIX) 75 MG tablet Take 1 tablet (75 mg total) by mouth daily. 07/11/19  Yes Dew, Erskine Squibb, MD  COMBIGAN 0.2-0.5 % ophthalmic solution Place 1 drop into both eyes 2 (two) times daily.  08/15/19  Yes [provider]  nicotine (NICODERM CQ - DOSED IN MG/24 HOURS) 21 mg/24hr patch Place 1 patch (21 mg total) onto the skin daily. 09/13/19  Yes Sreenath, Sudheer B, MD  Omega-3 Fatty Acids (OMEGA-3 FISH OIL PO) Take 1 capsule by mouth daily.   Yes [provider]  TRAVATAN Z 0.004 % SOLN ophthalmic solution Place 1 drop into both eyes at bedtime.  04/01/18  Yes [provider]      VITAL SIGNS:  Blood pressure (!) 163/82, pulse 98, temperature 97.8 F (36.6 C), temperature source Oral, resp. rate 18, height 6\' 1"  (1.854 m), weight 74.8 kg, SpO2 99 %.  PHYSICAL EXAMINATION:  Physical Exam  GENERAL:  69 y.o.-year-old African-American male patient lying in the bed with mild respiratory distress with conversational dyspnea. EYES: Pupils equal, round, reactive to light and accommodation. No scleral icterus. Extraocular muscles intact.  HEENT: Head atraumatic, normocephalic. Oropharynx and nasopharynx clear.  NECK:  Supple, no jugular venous distention. No thyroid enlargement, no tenderness.  LUNGS: Slight diminished expiratory airflow with occasional expiratory wheezes and harsh vesicular breathing. CARDIOVASCULAR: Regular rate and rhythm, S1, S2 normal. No murmurs, rubs, or gallops.  ABDOMEN: Soft, nondistended, nontender. Bowel sounds present. No organomegaly or mass.  EXTREMITIES: No pedal edema, cyanosis, or clubbing.  NEUROLOGIC: Cranial nerves II  through XII are intact. Muscle strength 5/5 in all extremities. Sensation intact. Gait not checked.  PSYCHIATRIC: The patient is alert and oriented x 3.  Normal affect and good eye contact. SKIN: No obvious rash, lesion, or ulcer.   LABORATORY PANEL:   CBC Recent Labs  Lab 11/08/19 1808  WBC 6.0  HGB 11.8*  HCT 35.9*  PLT 158   ------------------------------------------------------------------------------------------------------------------  Chemistries  Recent Labs  Lab 11/08/19 1808  NA 137  K 4.0  CL 100  CO2 22  GLUCOSE 91  BUN 14  CREATININE 0.61  CALCIUM 9.2   ------------------------------------------------------------------------------------------------------------------  Cardiac Enzymes No results for input(s): TROPONINI in the last 168 hours. ------------------------------------------------------------------------------------------------------------------  RADIOLOGY:  DG Chest 2 View  Result Date: 11/08/2019 CLINICAL DATA:  Shortness of breath.  Hypertension. EXAM: CHEST - 2 VIEW COMPARISON:  10/17/2019 FINDINGS: Right internal jugular power port tip in the SVC above the right atrium as seen previously. Heart size is normal. Mild aortic atherosclerosis. Pronounced emphysema with pulmonary hyperinflation. Linear scarring in the left mid lung. No evidence of active infiltrate, mass, effusion or collapse. No  pulmonary edema. IMPRESSION: Background pattern of advanced emphysema. Linear scarring in the left mid lung. No active finding otherwise. Electronically Signed   By: Nelson Chimes M.D.   On: 11/08/2019 15:41      IMPRESSION AND PLAN:   1. COPD with acute exacerbation, like secondary to noncompliance and possibly acute bronchitis with subsequent acute hypoxic respiratory failure. -The patient will be admitted to a medically monitored observation bed. -We will continue steroid therapy with IV Solu-Medrol as well as duo nebs 4 times daily and every 4 hours as  needed. -Mucolytic therapy will be provided. -We will place him on IV Rocephin and Zithromax.  2. Tobacco abuse. -He was counseled for smoking cessation and will receive further counseling here. -We will utilize NicoDerm CQ.  3. Uncontrolled hypertension. -He will be placed on as needed IV labetalol and started on p.o. Norvasc.  4. Glaucoma. -We will continue his ophthalmic GTT.  5. Dyslipidemia. -Statin therapy will be resumed.  6. History of lung cancer status post treatment in 2017 with no recurrence.  7. DVT prophylaxis. -Subtenons Lovenox    All the records are reviewed and case discussed with ED provider. The plan of care was discussed in details with the patient (and family). I answered all questions. The patient agreed to proceed with the above mentioned plan. Further management will depend upon hospital course.   CODE STATUS: Full code  Status is: Observation  The patient remains OBS appropriate and will d/c before 2 midnights.  Dispo: The patient is from: Home              Anticipated d/c is to: Home              Anticipated d/c date is: 1 day              Patient currently is not medically stable to d/c.   TOTAL TIME TAKING CARE OF THIS PATIENT: 50 minutes.    Christel Mormon M.D on 11/08/2019 at 10:16 PM  [facility]   From 7 PM-7 AM, contact night-coverage [email or web address]  CC: Primary care physician; Kirk Ruths, MD   Note: This dictation was prepared with Dragon dictation along with smaller phrase technology. Any transcriptional errors that result from this process are unintentional.

## 2019-11-08 NOTE — ED Provider Notes (Addendum)
[facility] Emergency Department Provider Note  ____________________________________________   First MD Initiated Contact with Patient 11/08/19 1746     (approximate)  I have reviewed the triage vital signs and the nursing notes.   HISTORY  Chief Complaint Hypertension    HPI Carl Bell is a 69 y.o. male  With h/o HTN, COPD here with SOB. Pt reports he has had worsening SOB for the past day. He felt fine yesterday. He does continue to smoke. He's had wheezing, SOB and feeling like he has sputum that won't come out. No chest pain. Feels similar to his COPD exacerbation. No fever, chills. No CP. No leg swelling. Mild improvement noted w/ albuterol inhaler. No alleviating factors.        Past Medical History:  Diagnosis Date  . COPD (chronic obstructive pulmonary disease) ([place])   . Emphysema of lung ([place])   . Glaucoma   . Hypertension   . Lung cancer [facility])     Patient Active Problem List   Diagnosis Date Noted  . COPD exacerbation ([place]) 11/08/2019  . Acute exacerbation of chronic obstructive pulmonary disease (COPD) ([place]) 10/18/2019  . Hypertensive urgency 10/17/2019  . COPD with acute exacerbation ([place]) 09/12/2019  . Acute on chronic respiratory failure with hypoxia and hypercapnia ([place]) 09/12/2019  . Tobacco abuse 09/12/2019  . PAD (peripheral artery disease) ([place]) 09/12/2019  . HLD (hyperlipidemia) 09/12/2019  . HTN (hypertension) 09/12/2019  . Atherosclerosis of native arteries of the extremities with ulceration ([place]) 06/28/2019  . Sepsis ([place]) 12/24/2017  . Protein-calorie malnutrition, severe 11/12/2017  . Acute respiratory failure ([place]) 11/10/2017  . Acute respiratory distress 12/22/2015  . COPD (chronic obstructive pulmonary disease) ([place]) 12/22/2015  . Acute bronchitis 12/22/2015  . HTN, goal below 140/80 12/22/2015  . Lung cancer, lingula ([place]) 09/23/2015  . Chronic edema 09/15/2014    Past Surgical History:  Procedure  Laterality Date  . LOWER EXTREMITY ANGIOGRAPHY Left 07/11/2019   Procedure: LOWER EXTREMITY ANGIOGRAPHY;  Surgeon: Algernon Huxley, MD;  Location: [facility];  Service: Cardiovascular;  Laterality: Left;  . LUNG BIOPSY    . PERIPHERAL VASCULAR CATHETERIZATION N/A 09/24/2015   Procedure: [name];  Surgeon: Algernon Huxley, MD;  Location: [facility];  Service: Cardiovascular;  Laterality: N/A;  . Port a cath placement      Prior to Admission medications   Medication Sig Start Date End Date Taking? Authorizing Provider  albuterol (PROVENTIL) (2.5 MG/3ML) 0.083% nebulizer solution Take 3 mLs (2.5 mg total) by nebulization every 6 (six) hours as needed for wheezing or shortness of breath. 10/18/19  Yes Patrecia Pour, MD  albuterol (VENTOLIN HFA) 108 (90 Base) MCG/ACT inhaler Inhale 2 puffs into the lungs every 6 (six) hours as needed for wheezing or shortness of breath. 09/13/19  Yes Sreenath, Sudheer B, MD  aspirin EC 81 MG tablet Take 1 tablet (81 mg total) by mouth daily. 07/11/19  Yes Dew, Erskine Squibb, MD  atorvastatin (LIPITOR) 10 MG tablet Take 1 tablet (10 mg total) by mouth daily. 07/11/19 07/10/20 Yes Dew, Erskine Squibb, MD  Cholecalciferol (VITAMIN D-3) 125 MCG (5000 UT) TABS Take 5,000 Units by mouth daily.   Yes [provider]  clopidogrel (PLAVIX) 75 MG tablet Take 1 tablet (75 mg total) by mouth daily. 07/11/19  Yes Dew, Erskine Squibb, MD  COMBIGAN 0.2-0.5 % ophthalmic solution Place 1 drop into both eyes 2 (two) times daily.  08/15/19  Yes [provider]  nicotine (NICODERM CQ - DOSED IN MG/24 HOURS) 21 mg/24hr patch Place 1 patch (21 mg total) onto the skin daily. 09/13/19  Yes Sreenath, Sudheer B, MD  Omega-3 Fatty Acids (OMEGA-3 FISH OIL PO) Take 1 capsule by mouth daily.   Yes [provider]  TRAVATAN Z 0.004 % SOLN ophthalmic solution Place 1 drop into both eyes at bedtime.  04/01/18  Yes [provider]    Allergies Patient has no known  allergies.  Family History  Problem Relation Age of Onset  . Diabetes Mellitus II Mother   . Hypertension Father     Social History Social History   Tobacco Use  . Smoking status: Current Every Day Smoker    Packs/day: 0.50    Years: 50.00    Pack years: 25.00    Types: Cigarettes  . Smokeless tobacco: Never Used  Substance Use Topics  . Alcohol use: Yes    Alcohol/week: 0.0 standard drinks    Comment: 3 pints/week  . Drug use: No    Review of Systems  Review of Systems  Constitutional: Negative for chills, fatigue and fever.  HENT: Negative for sore throat.   Respiratory: Positive for cough and shortness of breath.   Cardiovascular: Negative for chest pain.  Gastrointestinal: Negative for abdominal pain.  Genitourinary: Negative for flank pain.  Musculoskeletal: Negative for neck pain.  Skin: Negative for rash and wound.  Allergic/Immunologic: Negative for immunocompromised state.  Neurological: Negative for weakness and numbness.  Hematological: Does not bruise/bleed easily.  All other systems reviewed and are negative.    ____________________________________________  PHYSICAL EXAM:      VITAL SIGNS: ED Triage Vitals  Enc Vitals Group     BP 11/08/19 1456 (!) 164/90     Pulse Rate 11/08/19 1456 96     Resp 11/08/19 1456 (!) 24     Temp 11/08/19 1456 98.4 F (36.9 C)     Temp Source 11/08/19 1456 Oral     SpO2 11/08/19 1456 100 %     Weight 11/08/19 1459 165 lb (74.8 kg)     Height 11/08/19 1459 6\' 1"  (1.854 m)     Head Circumference --      Peak Flow --      Pain Score --      Pain Loc --      Pain Edu? --      Excl. in [place]? --      Physical Exam Vitals and nursing note reviewed.  Constitutional:      General: He is not in acute distress.    Appearance: He is well-developed.  HENT:     Head: Normocephalic and atraumatic.  Eyes:     Conjunctiva/sclera: Conjunctivae normal.  Cardiovascular:     Rate and Rhythm: Regular rhythm. Tachycardia  present.     Heart sounds: Normal heart sounds.     Comments: Borderline tachycardia Pulmonary:     Effort: Pulmonary effort is normal. No respiratory distress.     Breath sounds: Wheezing present.     Comments: Moderate tachypnea with bilateral rales, prolonged expiration though speaking in full sentences Abdominal:     General: There is no distension.  Musculoskeletal:     Cervical back: Neck supple.  Skin:    General: Skin is warm.     Capillary Refill: Capillary refill takes less than 2 seconds.     Findings: No rash.  Neurological:     Mental Status: He is alert and oriented to  person, place, and time.     Motor: No abnormal muscle tone.       ____________________________________________   LABS (all labs ordered are listed, but only abnormal results are displayed)  Labs Reviewed  CBC - Abnormal; Notable for the following components:      Result Value   Hemoglobin 11.8 (*)    HCT 35.9 (*)    RDW 16.3 (*)    All other components within normal limits  BRAIN NATRIURETIC PEPTIDE - Abnormal; Notable for the following components:   B Natriuretic Peptide 174.0 (*)    All other components within normal limits  SARS CORONAVIRUS 2 (TAT 6-24 HRS)  BASIC METABOLIC PANEL  TROPONIN I (HIGH SENSITIVITY)  TROPONIN I (HIGH SENSITIVITY)    ____________________________________________  EKG: Sinus tachycardia, ventricular rate 108.  PR 112, QRS 82, QTc 471.  Pulmonary disease pattern with rightward axis deviation.  No acute ST elevations or depressions. ________________________________________  RADIOLOGY All imaging, including plain films, CT scans, and ultrasounds, independently reviewed by me, and interpretations confirmed via formal radiology reads.  ED MD interpretation:   CXR: Chronic emphysema, no focal PNA  Official radiology report(s): DG Chest 2 View  Result Date: 11/08/2019 CLINICAL DATA:  Shortness of breath.  Hypertension. EXAM: CHEST - 2 VIEW COMPARISON:   10/17/2019 FINDINGS: Right internal jugular power port tip in the SVC above the right atrium as seen previously. Heart size is normal. Mild aortic atherosclerosis. Pronounced emphysema with pulmonary hyperinflation. Linear scarring in the left mid lung. No evidence of active infiltrate, mass, effusion or collapse. No pulmonary edema. IMPRESSION: Background pattern of advanced emphysema. Linear scarring in the left mid lung. No active finding otherwise. Electronically Signed   By: Nelson Chimes M.D.   On: 11/08/2019 15:41    ____________________________________________  PROCEDURES   Procedure(s) performed (including Critical Care):  .Critical Care Performed by: Duffy Bruce, MD Authorized by: Duffy Bruce, MD   Critical care provider statement:    Critical care time (minutes):  35   Critical care time was exclusive of:  Separately billable procedures and treating other patients and teaching time   Critical care was necessary to treat or prevent imminent or life-threatening deterioration of the following conditions:  Cardiac failure, circulatory failure and respiratory failure   Critical care was time spent personally by me on the following activities:  Development of treatment plan with patient or surrogate, discussions with consultants, evaluation of patient's response to treatment, examination of patient, obtaining history from patient or surrogate, ordering and performing treatments and interventions, ordering and review of laboratory studies, ordering and review of radiographic studies, pulse oximetry, re-evaluation of patient's condition and review of old charts   I assumed direction of critical care for this patient from another provider in my specialty: no    .1-3 Lead EKG Interpretation Performed by: Duffy Bruce, MD Authorized by: Duffy Bruce, MD     Interpretation: normal     ECG rate:  80-90   ECG rate assessment: normal     Rhythm: sinus rhythm     Ectopy: none      Conduction: normal   Comments:     Indication: SOB    ____________________________________________  INITIAL IMPRESSION / MDM / ASSESSMENT AND PLAN / ED COURSE  As part of my medical decision making, I reviewed the following data within the [facility] notes reviewed and incorporated, Old chart reviewed, Notes from prior ED visits, and [place] Controlled Substance Database       *  Carl Bell was evaluated in Emergency Department on 11/08/2019 for the symptoms described in the history of present illness. He was evaluated in the context of the global COVID-19 pandemic, which necessitated consideration that the patient might be at risk for infection with the SARS-CoV-2 virus that causes COVID-19. Institutional protocols and algorithms that pertain to the evaluation of patients at risk for COVID-19 are in a state of rapid change based on information released by regulatory bodies including the CDC and federal and state organizations. These policies and algorithms were followed during the patient's care in the ED.  Some ED evaluations and interventions may be delayed as a result of limited staffing during the pandemic.*  Clinical Course as of Nov 08 1943  Tue Nov 08, 2019  1843 Feeling better, still wheezing diffusely. Does not feel like he will be able to manage at home.   [CI]    Clinical Course User Index [CI] Duffy Bruce, MD    Medical Decision Making:   History obtained and provided by: patient Records reviewed, notable for: recurrent COPD exacerbations, most recently in early [place] Images reviewed: As summarized above in free-text portion of imaging  69 year old male here with shortness of breath likely secondary to COPD exacerbation.  The patient has diffuse wheezing on exam and new onset mild hypoxia.  Chest x-ray shows no evidence of pneumonia.  Has no fever.  No leg swelling or signs of CHF or PE.  Based on his exam and hypoxia, patient given IV steroids as  well as 3 DuoNeb's.  He has improved work of breathing but persistent wheezing and dyspnea after this.  Will admit for ongoing treatment.  EKG nonischemic, doubt ACS.   ____________________________________________  FINAL CLINICAL IMPRESSION(S) / ED DIAGNOSES  Final diagnoses:  COPD exacerbation ([place])  Shortness of breath  Hypoxia     MEDICATIONS GIVEN DURING THIS VISIT:  Medications  ipratropium-albuterol (DUONEB) 0.5-2.5 (3) MG/3ML nebulizer solution 3 mL (3 mLs Nebulization Given 11/08/19 1809)  ipratropium-albuterol (DUONEB) 0.5-2.5 (3) MG/3ML nebulizer solution 3 mL (3 mLs Nebulization Given 11/08/19 1809)  ipratropium-albuterol (DUONEB) 0.5-2.5 (3) MG/3ML nebulizer solution 3 mL (3 mLs Nebulization Given 11/08/19 1809)  methylPREDNISolone sodium succinate (SOLU-MEDROL) 125 mg/2 mL injection 125 mg (125 mg Intravenous Given 11/08/19 1809)  0.9 %  sodium chloride infusion ( Intravenous New Bag/Given 11/08/19 1815)     ED Discharge Orders    None       Note:  This document was prepared using Dragon voice recognition software and may include unintentional dictation errors.   Duffy Bruce, MD 11/08/19 Tomasa Hose    Duffy Bruce, MD 11/08/19 Tomasa Hose    Duffy Bruce, MD 11/08/19 1946

## 2019-11-08 NOTE — ED Triage Notes (Signed)
Brought by ems.  Short of breath and hypertension.  90% on RA.  On 4 liters now.  Says he has been short of breath all day, but oxygen is helping.  He was given svn on the way here with also helped.

## 2019-11-09 DIAGNOSIS — J441 Chronic obstructive pulmonary disease with (acute) exacerbation: Secondary | ICD-10-CM

## 2019-11-09 LAB — BASIC METABOLIC PANEL
Anion gap: 7 (ref 5–15)
BUN: 22 mg/dL (ref 8–23)
CO2: 26 mmol/L (ref 22–32)
Calcium: 8.5 mg/dL — ABNORMAL LOW (ref 8.9–10.3)
Chloride: 105 mmol/L (ref 98–111)
Creatinine, Ser: 0.7 mg/dL (ref 0.61–1.24)
GFR calc Af Amer: >60 mL/min (ref >60)
GFR calc non Af Amer: >60 mL/min (ref >60)
Glucose, Bld: 133 mg/dL — ABNORMAL HIGH (ref 70–99)
Potassium: 4.3 mmol/L (ref 3.5–5.1)
Sodium: 138 mmol/L (ref 135–145)

## 2019-11-09 LAB — SARS CORONAVIRUS 2 (TAT 6-24 HRS): SARS Coronavirus 2: NEGATIVE

## 2019-11-09 LAB — CBC
HCT: 31.4 % — ABNORMAL LOW (ref 39.0–52.0)
Hemoglobin: 10.4 g/dL — ABNORMAL LOW (ref 13.0–17.0)
MCH: 26.5 pg (ref 26.0–34.0)
MCHC: 33.1 g/dL (ref 30.0–36.0)
MCV: 79.9 fL — ABNORMAL LOW (ref 80.0–100.0)
Platelets: 138 10*3/uL — ABNORMAL LOW (ref 150–400)
RBC: 3.93 MIL/uL — ABNORMAL LOW (ref 4.22–5.81)
RDW: 15.7 % — ABNORMAL HIGH (ref 11.5–15.5)
WBC: 4.2 10*3/uL (ref 4.0–10.5)
nRBC: 0 % (ref 0.0–0.2)

## 2019-11-09 MED ORDER — CHLORHEXIDINE GLUCONATE CLOTH 2 % EX PADS: 6.0000 | MEDICATED_PAD | Freq: Every day | CUTANEOUS | Status: DC

## 2019-11-09 MED ORDER — AMLODIPINE BESYLATE 5 MG PO TABS
5.0000 mg | ORAL_TABLET | Freq: Every day | ORAL | Status: DC
  Administered 2019-11-09: 5 mg via ORAL
  Filled 2019-11-09: qty 1

## 2019-11-09 MED ORDER — ENSURE ENLIVE PO LIQD
237.0000 mL | Freq: Three times a day (TID) | ORAL | Status: DC
  Administered 2019-11-09: 237 mL via ORAL

## 2019-11-09 MED ORDER — PREDNISONE 20 MG PO TABS
40.0000 mg | ORAL_TABLET | Freq: Every day | ORAL | 0 refills | Status: AC
Start: 2019-11-09 — End: 2019-11-13

## 2019-11-09 MED ORDER — ADULT MULTIVITAMIN W/MINERALS CH: 1.0000 | ORAL_TABLET | Freq: Every day | ORAL | Status: DC

## 2019-11-09 MED ORDER — AMLODIPINE BESYLATE 5 MG PO TABS: 2.5000 mg | ORAL_TABLET | Freq: Every day | ORAL | 0 refills | Status: DC

## 2019-11-09 MED ORDER — HEPARIN SOD (PORK) LOCK FLUSH 10 UNIT/ML IV SOLN
10.0000 [IU] | Freq: Once | INTRAVENOUS | Status: AC
  Administered 2019-11-09: 10 [IU] via INTRAVENOUS
  Filled 2019-11-09: qty 1

## 2019-11-09 MED ORDER — ALBUTEROL SULFATE HFA 108 (90 BASE) MCG/ACT IN AERS
2.0000 | INHALATION_SPRAY | Freq: Four times a day (QID) | RESPIRATORY_TRACT | 0 refills | Status: DC | PRN
Start: 2019-11-09 — End: 2020-07-08

## 2019-11-09 NOTE — Progress Notes (Signed)
Initial Nutrition Assessment  DOCUMENTATION CODES:   Severe malnutrition in context of chronic illness  INTERVENTION:   Ensure Enlive po TID, each supplement provides 350 kcal and 20 grams of protein  Magic cup TID with meals, each supplement provides 290 kcal and 9 grams of protein  MVI daily   Liberalize diet   NUTRITION DIAGNOSIS:   Severe Malnutrition related to chronic illness(lung cancer, COPD) as evidenced by severe fat depletion, severe muscle depletion.  GOAL:   Patient will meet greater than or equal to 90% of their needs  MONITOR:   PO intake, Supplement acceptance, Labs, Weight trends, Skin, I & O's  REASON FOR ASSESSMENT:   Malnutrition Screening Tool    ASSESSMENT:   69 y.o. African-American male with a known history of COPD, hypertension, lung cancer; hx stage III SCC of lung s/p chemotherapy and XRT, EtOH abuse and glaucoma who is admitted with COPD exacerbation  Met with pt in room today. Pt is known to this RD from previous admits. Pt reports good appetite and oral intake in hospital; pt ate 100% of his breakfast this morning. Pt reports intermittent poor appetite and oral intake at home. Pt reports that his appetite has improved now that he is no longer receiving cancer treatments. Pt is trying to regain his weight; pt drinks 2-3 vanilla Ensure per day at home. Per chart, pt has gained 24lbs since this RD last saw him. Pt remains malnourished. RD will add supplements and vitamins to help pt meet his estimated needs. RD will also liberalize pt's diet.   Medications reviewed and include: aspirin, D3, plavix, lovenox, solu-medrol, MVI, nicotine, lovaza, NaCl @100ml /hr, azithromycin, ceftriaxone   Labs reviewed: Hgb 10.4(L), Hct 31.4(L), MCV 79.9(L)   NUTRITION - FOCUSED PHYSICAL EXAM:    Most Recent Value  Orbital Region  Moderate depletion  Upper Arm Region  Severe depletion  Thoracic and Lumbar Region  Severe depletion  Buccal Region  Moderate  depletion  Temple Region  Severe depletion  Clavicle Bone Region  Severe depletion  Clavicle and Acromion Bone Region  Severe depletion  Scapular Bone Region  Moderate depletion  Dorsal Hand  Severe depletion  Patellar Region  Severe depletion  Anterior Thigh Region  Severe depletion  Posterior Calf Region  Severe depletion  Edema (RD Assessment)  None  Hair  Reviewed  Eyes  Reviewed  Mouth  Reviewed  Skin  Reviewed  Nails  Reviewed     Diet Order:   Diet Order            Diet regular Room service appropriate? Yes; Fluid consistency: Thin  Diet effective now             EDUCATION NEEDS:   Education needs have been addressed  Skin:  Skin Assessment: Reviewed RN Assessment  Last BM:  4/26  Height:   Ht Readings from Last 1 Encounters:  11/08/19 6' 1"  (1.854 m)    Weight:   Wt Readings from Last 1 Encounters:  11/08/19 74.8 kg    Ideal Body Weight:  83.6 kg  BMI:  Body mass index is 21.77 kg/m.  Estimated Nutritional Needs:   Kcal:  2200-2500kcal/day  Protein:  110-125g/day  Fluid:  >1.9L/day  Koleen Distance MS, RD, LDN Please refer to [facility] for RD and/or RD on-call/weekend/after hours pager

## 2019-11-09 NOTE — Progress Notes (Signed)
Navraj A Crumby A and O x4. VSS. Pt tolerating diet well. No complaints of nausea or vomiting. IV removed intact, prescriptions given. Pt voices understanding of discharge instructions with no further questions. Patient discharged via wheelchair with NT  Allergies as of 11/09/2019   No Known Allergies     Medication List    TAKE these medications   albuterol (2.5 MG/3ML) 0.083% nebulizer solution Commonly known as: PROVENTIL Take 3 mLs (2.5 mg total) by nebulization every 6 (six) hours as needed for wheezing or shortness of breath.   albuterol 108 (90 Base) MCG/ACT inhaler Commonly known as: VENTOLIN HFA Inhale 2 puffs into the lungs every 6 (six) hours as needed for wheezing or shortness of breath.   amLODipine 5 MG tablet Commonly known as: NORVASC Take 0.5 tablets (2.5 mg total) by mouth daily. Start taking on: November 10, 2019   aspirin EC 81 MG tablet Take 1 tablet (81 mg total) by mouth daily.   atorvastatin 10 MG tablet Commonly known as: Lipitor Take 1 tablet (10 mg total) by mouth daily.   clopidogrel 75 MG tablet Commonly known as: Plavix Take 1 tablet (75 mg total) by mouth daily.   Combigan 0.2-0.5 % ophthalmic solution Generic drug: brimonidine-timolol Place 1 drop into both eyes 2 (two) times daily.   nicotine 21 mg/24hr patch Commonly known as: NICODERM CQ - dosed in mg/24 hours Place 1 patch (21 mg total) onto the skin daily.   OMEGA-3 FISH OIL PO Take 1 capsule by mouth daily.   predniSONE 20 MG tablet Commonly known as: DELTASONE Take 2 tablets (40 mg total) by mouth daily for 4 days.   Travatan Z 0.004 % Soln ophthalmic solution Generic drug: Travoprost (BAK Free) Place 1 drop into both eyes at bedtime.   Vitamin D-3 125 MCG (5000 UT) Tabs Take 5,000 Units by mouth daily.       Vitals:   11/09/19 0853 11/09/19 1157  BP: 135/84 110/72  Pulse:  86  Resp:  18  Temp:  97.8 F (36.6 C)  SpO2:  99%    Darnelle Catalan

## 2019-11-09 NOTE — Progress Notes (Signed)
SATURATION QUALIFICATIONS: (This note is used to comply with regulatory documentation for home oxygen)  Patient Saturations on Room Air at Rest = 100%  Patient Saturations on Room Air while Ambulating = 96%   Please briefly explain why patient needs home oxygen: N/A

## 2019-11-09 NOTE — Care Management Obs Status (Signed)
[place] NOTIFICATION   Patient Details  Name: Carl Bell MRN: [id number] Date of Birth: 1950-11-05   Medicare Observation Status Notification Given:  No(admitted obs less than 24 hours)    Beverly Sessions, RN 11/09/2019, 3:47 PM

## 2019-11-09 NOTE — Discharge Summary (Addendum)
Physician Discharge Summary  Carl Bell TSV:[id number] DOB: March 02, 1951 DOA: 11/08/2019  PCP: Kirk Ruths, MD  Admit date: 11/08/2019 Discharge date: 11/09/2019  Discharge disposition: Home   Recommendations for Outpatient Follow-Up:   Follow-up with PCP in 1 week   Discharge Diagnosis:   Active Problems:   COPD exacerbation ([place])    Discharge Condition: Stable.  Diet recommendation: Heart healthy diet  Code status: Full code.    Hospital Course:   Carl Bell  is a 69 y.o. African-American male with a known history of COPD, hypertension, lung cancer, glaucoma, recent discharge from the hospital on 10/18/2019 for COPD exacerbation, who presented to the emergency room with shortness of breath, cough and wheezing.  His oxygen saturation was 88% on room air.  He was admitted to the hospital for COPD exacerbation and acute hypoxemic respiratory failure.  He was treated with steroids, bronchodilators and oxygen.  His condition has improved and is being weaned off of oxygen.  He ambulated in the hallways without any oxygen desaturation or symptoms.  Patient said he had run out of his albuterol inhaler so a new prescription was provided.  He is also hypertensive but not on any medications.  He was started on low-dose amlodipine for BP control.  Patient was found to have a large right scrotal swelling.  He said it was painless and he has had it for several years (since his 24s).  Follow-up with his PCP for further evaluation of scrotal swelling was recommended.  He is deemed stable for discharge to home today.      Discharge Exam:   Vitals:   11/09/19 0853 11/09/19 1157  BP: 135/84 110/72  Pulse:  86  Resp:  18  Temp:  97.8 F (36.6 C)  SpO2:  99%   Vitals:   11/08/19 2110 11/09/19 0243 11/09/19 0853 11/09/19 1157  BP: (!) 163/82 117/86 135/84 110/72  Pulse: 98 70  86  Resp: 18 18  18   Temp: 97.8 F (36.6 C) 97.8 F (36.6 C)  97.8 F (36.6 C)  TempSrc: Oral  Oral    SpO2: 99% 98%  99%  Weight:      Height:         GEN: NAD SKIN: Rash/ lesions (grayish, maculopapular lesions) on the back of his neck and upper back EYES: EOMI ENT: MMM CV: RRR PULM: CTA B ABD: soft, ND, NT, +BS CNS: AAO x 3, non focal EXT: No edema or tenderness GU: Large right scrotal swelling (he said he has had it for many years- since his 43s)   The results of significant diagnostics from this hospitalization (including imaging, microbiology, ancillary and laboratory) are listed below for reference.     Procedures and Diagnostic Studies:   DG Chest 2 View  Result Date: 11/08/2019 CLINICAL DATA:  Shortness of breath.  Hypertension. EXAM: CHEST - 2 VIEW COMPARISON:  10/17/2019 FINDINGS: Right internal jugular power port tip in the SVC above the right atrium as seen previously. Heart size is normal. Mild aortic atherosclerosis. Pronounced emphysema with pulmonary hyperinflation. Linear scarring in the left mid lung. No evidence of active infiltrate, mass, effusion or collapse. No pulmonary edema. IMPRESSION: Background pattern of advanced emphysema. Linear scarring in the left mid lung. No active finding otherwise. Electronically Signed   By: Nelson Chimes M.D.   On: 11/08/2019 15:41     Labs:   Basic Metabolic Panel: Recent Labs  Lab 11/08/19 1808 11/09/19 0537  NA 137 138  K  4.0 4.3  CL 100 105  CO2 22 26  GLUCOSE 91 133*  BUN 14 22  CREATININE 0.61 0.70  CALCIUM 9.2 8.5*   GFR Estimated Creatinine Clearance: 90.9 mL/min (by C-G formula based on SCr of 0.7 mg/dL). Liver Function Tests: No results for input(s): AST, ALT, ALKPHOS, BILITOT, PROT, ALBUMIN in the last 168 hours. No results for input(s): LIPASE, AMYLASE in the last 168 hours. No results for input(s): AMMONIA in the last 168 hours. Coagulation profile No results for input(s): INR, PROTIME in the last 168 hours.  CBC: Recent Labs  Lab 11/08/19 1808 11/09/19 0537  WBC 6.0 4.2  HGB 11.8*  10.4*  HCT 35.9* 31.4*  MCV 80.5 79.9*  PLT 158 138*   Cardiac Enzymes: No results for input(s): CKTOTAL, CKMB, CKMBINDEX, TROPONINI in the last 168 hours. BNP: Invalid input(s): POCBNP CBG: No results for input(s): GLUCAP in the last 168 hours. D-Dimer No results for input(s): DDIMER in the last 72 hours. Hgb A1c No results for input(s): HGBA1C in the last 72 hours. Lipid Profile No results for input(s): CHOL, HDL, LDLCALC, TRIG, CHOLHDL, LDLDIRECT in the last 72 hours. Thyroid function studies No results for input(s): TSH, T4TOTAL, T3FREE, THYROIDAB in the last 72 hours.  Invalid input(s): FREET3 Anemia work up No results for input(s): VITAMINB12, FOLATE, FERRITIN, TIBC, IRON, RETICCTPCT in the last 72 hours. Microbiology Recent Results (from the past 240 hour(s))  SARS CORONAVIRUS 2 (TAT 6-24 HRS) Nasopharyngeal Nasopharyngeal Swab     Status: None   Collection Time: 11/08/19  7:06 PM   Specimen: Nasopharyngeal Swab  Result Value Ref Range Status   SARS Coronavirus 2 NEGATIVE NEGATIVE Final    Comment: (NOTE) SARS-CoV-2 target nucleic acids are NOT DETECTED. The SARS-CoV-2 RNA is generally detectable in upper and lower respiratory specimens during the acute phase of infection. Negative results do not preclude SARS-CoV-2 infection, do not rule out co-infections with other pathogens, and should not be used as the sole basis for treatment or other patient management decisions. Negative results must be combined with clinical observations, patient history, and epidemiological information. The expected result is Negative. Fact Sheet for Patients: SugarRoll.be Fact Sheet for Healthcare Providers: https://www.woods-mathews.com/ This test is not yet approved or cleared by the [place] FDA and  has been authorized for detection and/or diagnosis of SARS-CoV-2 by FDA under an Emergency Use Authorization (EUA). This EUA will remain  in  effect (meaning this test can be used) for the duration of the COVID-19 declaration under Section 56 4(b)(1) of the Act, 21 U.S.C. section 360bbb-3(b)(1), unless the authorization is terminated or revoked sooner. Performed at [facility], [place] [address] [place],  [address]      Discharge Instructions:   Discharge Instructions    Diet - low sodium heart healthy   Complete by: As directed    Discharge instructions   Complete by: As directed    Stop smoking cigarettes   Increase activity slowly   Complete by: As directed      Allergies as of 11/09/2019   No Known Allergies     Medication List    TAKE these medications   albuterol (2.5 MG/3ML) 0.083% nebulizer solution Commonly known as: PROVENTIL Take 3 mLs (2.5 mg total) by nebulization every 6 (six) hours as needed for wheezing or shortness of breath.   albuterol 108 (90 Base) MCG/ACT inhaler Commonly known as: VENTOLIN HFA Inhale 2 puffs into the lungs every 6 (six) hours as needed for  wheezing or shortness of breath.   amLODipine 5 MG tablet Commonly known as: NORVASC Take 0.5 tablets (2.5 mg total) by mouth daily. Start taking on: November 10, 2019   aspirin EC 81 MG tablet Take 1 tablet (81 mg total) by mouth daily.   atorvastatin 10 MG tablet Commonly known as: Lipitor Take 1 tablet (10 mg total) by mouth daily.   clopidogrel 75 MG tablet Commonly known as: Plavix Take 1 tablet (75 mg total) by mouth daily.   Combigan 0.2-0.5 % ophthalmic solution Generic drug: brimonidine-timolol Place 1 drop into both eyes 2 (two) times daily.   nicotine 21 mg/24hr patch Commonly known as: NICODERM CQ - dosed in mg/24 hours Place 1 patch (21 mg total) onto the skin daily.   OMEGA-3 FISH OIL PO Take 1 capsule by mouth daily.   predniSONE 20 MG tablet Commonly known as: DELTASONE Take 2 tablets (40 mg total) by mouth daily for 4 days.   Travatan Z 0.004 % Soln ophthalmic solution Generic drug:  Travoprost (BAK Free) Place 1 drop into both eyes at bedtime.   Vitamin D-3 125 MCG (5000 UT) Tabs Take 5,000 Units by mouth daily.         Time coordinating discharge: 34 minutes  Signed:  Ori Trejos  [facility] 11/09/2019, 5:55 PM

## 2020-01-12 ENCOUNTER — Emergency Department
Admission: EM | Admit: 2020-01-12 | Discharge: 2020-01-12 | Disposition: A | Discharge status: Home / Self Care | Payer: Medicare (Managed Care) | Attending: Emergency Medicine | Admitting: Emergency Medicine

## 2020-01-12 ENCOUNTER — Encounter: Payer: Self-pay | Admitting: Emergency Medicine

## 2020-01-12 ENCOUNTER — Other Ambulatory Visit: Payer: Self-pay

## 2020-01-12 DIAGNOSIS — Z7982 Long term (current) use of aspirin: Secondary | ICD-10-CM | POA: Diagnosis not present

## 2020-01-12 DIAGNOSIS — F17210 Nicotine dependence, cigarettes, uncomplicated: Secondary | ICD-10-CM | POA: Diagnosis not present

## 2020-01-12 DIAGNOSIS — J441 Chronic obstructive pulmonary disease with (acute) exacerbation: Secondary | ICD-10-CM | POA: Insufficient documentation

## 2020-01-12 DIAGNOSIS — Z85118 Personal history of other malignant neoplasm of bronchus and lung: Secondary | ICD-10-CM | POA: Diagnosis not present

## 2020-01-12 DIAGNOSIS — I10 Essential (primary) hypertension: Secondary | ICD-10-CM | POA: Diagnosis not present

## 2020-01-12 DIAGNOSIS — Z79899 Other long term (current) drug therapy: Secondary | ICD-10-CM | POA: Insufficient documentation

## 2020-01-12 DIAGNOSIS — M79672 Pain in left foot: Secondary | ICD-10-CM | POA: Diagnosis not present

## 2020-01-12 DIAGNOSIS — M79671 Pain in right foot: Secondary | ICD-10-CM | POA: Diagnosis not present

## 2020-01-12 MED ORDER — CEPHALEXIN 500 MG PO CAPS: 500.0000 mg | ORAL_CAPSULE | Freq: Four times a day (QID) | ORAL | 0 refills | Status: AC

## 2020-01-12 NOTE — ED Triage Notes (Signed)
Pt presents from home via [facility] with c/o bilateral foot pain. Pt has hx of foot surgery, endorses foul smell of feet. Pt has missed post-op follow-up appointments. Pt also endorses exertional shortness of breath. pt current every day smoker. Hx of copd

## 2020-01-12 NOTE — ED Notes (Signed)
E-signature not working at this time. Pt verbalized understanding of D/C instructions, prescriptions and follow up care with no further questions at this time. Pt in NAD and ambulatory at time of D/C.  

## 2020-01-12 NOTE — Discharge Instructions (Addendum)
Please seek medical attention for any high fevers, chest pain, shortness of breath, change in behavior, persistent vomiting, bloody stool or any other new or concerning symptoms.  

## 2020-01-12 NOTE — ED Notes (Signed)
Pt son updated on patient condition.

## 2020-01-12 NOTE — ED Provider Notes (Signed)
[facility] Emergency Department Provider Note  ____________________________________________   I have reviewed the triage vital signs and the nursing notes.   HISTORY  Chief Complaint Foot Pain   History limited by: Not Limited   HPI Carl Bell is a 69 y.o. male who presents to the emergency department today because of concern for bilateral foot pain. The patient states that the pain has been present for roughly 1 month. Located in both feet. The patient states that he has had similar pain in the past when he has had circulation issues in his legs. States that he has not tried any medication for the pain. The patient says he did have an appointment with vascular surgery today but was not able to make it because his transport vehicle broke down.    Records reviewed. Per medical record review patient has a history of COPD, PAD.   Past Medical History:  Diagnosis Date  . COPD (chronic obstructive pulmonary disease) ([place])   . Emphysema of lung ([place])   . Glaucoma   . Hypertension   . Lung cancer [facility])     Patient Active Problem List   Diagnosis Date Noted  . COPD exacerbation ([place]) 11/08/2019  . Acute exacerbation of chronic obstructive pulmonary disease (COPD) ([place]) 10/18/2019  . Hypertensive urgency 10/17/2019  . COPD with acute exacerbation ([place]) 09/12/2019  . Acute on chronic respiratory failure with hypoxia and hypercapnia ([place]) 09/12/2019  . Tobacco abuse 09/12/2019  . PAD (peripheral artery disease) ([place]) 09/12/2019  . HLD (hyperlipidemia) 09/12/2019  . HTN (hypertension) 09/12/2019  . Atherosclerosis of native arteries of the extremities with ulceration ([place]) 06/28/2019  . Sepsis ([place]) 12/24/2017  . Protein-calorie malnutrition, severe 11/12/2017  . Acute respiratory failure ([place]) 11/10/2017  . Acute respiratory distress 12/22/2015  . COPD (chronic obstructive pulmonary disease) ([place]) 12/22/2015  . Acute bronchitis 12/22/2015  . HTN, goal  below 140/80 12/22/2015  . Lung cancer, lingula ([place]) 09/23/2015  . Chronic edema 09/15/2014    Past Surgical History:  Procedure Laterality Date  . LOWER EXTREMITY ANGIOGRAPHY Left 07/11/2019   Procedure: LOWER EXTREMITY ANGIOGRAPHY;  Surgeon: Algernon Huxley, MD;  Location: [place] [facility];  Service: Cardiovascular;  Laterality: Left;  . LUNG BIOPSY    . PERIPHERAL VASCULAR CATHETERIZATION N/A 09/24/2015   Procedure: [name];  Surgeon: Algernon Huxley, MD;  Location: [place] [facility];  Service: Cardiovascular;  Laterality: N/A;  . Port a cath placement      Prior to Admission medications   Medication Sig Start Date End Date Taking? Authorizing Provider  albuterol (PROVENTIL) (2.5 MG/3ML) 0.083% nebulizer solution Take 3 mLs (2.5 mg total) by nebulization every 6 (six) hours as needed for wheezing or shortness of breath. 10/18/19   Patrecia Pour, MD  albuterol (VENTOLIN HFA) 108 (90 Base) MCG/ACT inhaler Inhale 2 puffs into the lungs every 6 (six) hours as needed for wheezing or shortness of breath. 11/09/19   Jennye Boroughs, MD  amLODipine (NORVASC) 5 MG tablet Take 0.5 tablets (2.5 mg total) by mouth daily. 11/10/19 12/10/19  Jennye Boroughs, MD  aspirin EC 81 MG tablet Take 1 tablet (81 mg total) by mouth daily. 07/11/19   Algernon Huxley, MD  atorvastatin (LIPITOR) 10 MG tablet Take 1 tablet (10 mg total) by mouth daily. 07/11/19 07/10/20  Algernon Huxley, MD  Cholecalciferol (VITAMIN D-3) 125 MCG (5000 UT) TABS Take 5,000 Units by mouth daily.    [provider]  clopidogrel (PLAVIX) 75 MG tablet Take 1 tablet (75 mg total) by mouth daily. 07/11/19   Algernon Huxley, MD  COMBIGAN 0.2-0.5 % ophthalmic solution Place 1 drop into both eyes 2 (two) times daily.  08/15/19   [provider]  nicotine (NICODERM CQ - DOSED IN MG/24 HOURS) 21 mg/24hr patch Place 1 patch (21 mg total) onto the skin daily. 09/13/19   Sreenath, Trula Slade, MD  Omega-3 Fatty Acids (OMEGA-3 FISH OIL PO)  Take 1 capsule by mouth daily.    [provider]  TRAVATAN Z 0.004 % SOLN ophthalmic solution Place 1 drop into both eyes at bedtime.  04/01/18   [provider]    Allergies Patient has no known allergies.  Family History  Problem Relation Age of Onset  . Diabetes Mellitus II Mother   . Hypertension Father     Social History Social History   Tobacco Use  . Smoking status: Current Every Day Smoker    Packs/day: 0.50    Years: 50.00    Pack years: 25.00    Types: Cigarettes  . Smokeless tobacco: Never Used  Vaping Use  . Vaping Use: Never used  Substance Use Topics  . Alcohol use: Yes    Alcohol/week: 0.0 standard drinks    Comment: 3 pints/week  . Drug use: No    Review of Systems Constitutional: No fever/chills Eyes: No visual changes. ENT: No sore throat. Cardiovascular: Denies chest pain. Respiratory: Denies shortness of breath. Gastrointestinal: No abdominal pain.  No nausea, no vomiting.  No diarrhea.   Genitourinary: Negative for dysuria. Musculoskeletal: Positive for bilateral foot pain. Skin: Negative for rash. Neurological: Negative for headaches, focal weakness or numbness.  ____________________________________________   PHYSICAL EXAM:  VITAL SIGNS: ED Triage Vitals  Enc Vitals Group     BP 01/12/20 1613 140/85     Pulse Rate 01/12/20 1613 (!) 104     Resp 01/12/20 1613 (!) 22     Temp 01/12/20 1613 98.7 F (37.1 C)     Temp Source 01/12/20 1613 Oral     SpO2 01/12/20 1613 100 %     Weight 01/12/20 1615 164 lb 14.5 oz (74.8 kg)     Height 01/12/20 1615 6\' 1"  (1.854 m)     Head Circumference --      Peak Flow --      Pain Score 01/12/20 1615 7   Constitutional: Alert and oriented.  Eyes: Conjunctivae are normal.  ENT      Head: Normocephalic and atraumatic.      Nose: No congestion/rhinnorhea.      Mouth/Throat: Mucous membranes are moist.      Neck: No stridor. Hematological/Lymphatic/Immunilogical: No cervical  lymphadenopathy. Cardiovascular: Normal rate, regular rhythm. Bilateral dopplerable dorsalis pedis pulses.  Respiratory: Normal respiratory effort without tachypnea nor retractions. Genitourinary: Deferred Musculoskeletal: Normal range of motion in all extremities. No lower extremity edema. Neurologic:  Normal speech and language. No gross focal neurologic deficits are appreciated.  Skin:  Skin is warm, dry and intact. No rash noted. Psychiatric: Mood and affect are normal. Speech and behavior are normal. Patient exhibits appropriate insight and judgment.  ____________________________________________    LABS (pertinent positives/negatives)  None  ____________________________________________   EKG  I, Nance Pear, attending physician, personally viewed and interpreted this EKG  EKG Time: 1613 Rate: 107 Rhythm: sinus tachycardia Axis: normal Intervals: qtc 462 QRS: narrow, q waves v1 ST changes: no st elevation Impression: abnormal ekg   ____________________________________________  RADIOLOGY  None  ____________________________________________   PROCEDURES  Procedures  ____________________________________________   INITIAL IMPRESSION / ASSESSMENT AND PLAN / ED COURSE  Pertinent labs & imaging results that were available during my care of the patient were reviewed by me and considered in my medical decision making (see chart for details).   Patient presented to the emergency department today because of concerns for bilateral foot pain and inability to get to vascular surgery appointment.  Exam patient had dopplerable dorsalis pedal pulses bilaterally.  There was some minimal skin breakdown on the dorsal surface of the right great toe.  There is evidence of poor hygiene to his feet with some maggots appreciated between the toes however no skin breakdown or signs of infection between the toes.  At this time do not feel any emergent imaging is necessary as I doubt  arterial occlusion or osteomyelitis.  I do think patient would benefit from follow-up with vascular surgery as well as podiatry to help monitor the slight amount of breakdown over his foot.  Discussed with patient importance of good hygiene and cleansing of his foot.  Will plan on discharging with antibiotics out of an abundance of caution although again at this time I doubt significant infection.  ____________________________________________   FINAL CLINICAL IMPRESSION(S) / ED DIAGNOSES  Final diagnoses:  Bilateral foot pain     Note: This dictation was prepared with Dragon dictation. Any transcriptional errors that result from this process are unintentional     Nance Pear, MD 01/12/20 1711

## 2020-05-13 DIAGNOSIS — H401133 Primary open-angle glaucoma, bilateral, severe stage: Secondary | ICD-10-CM | POA: Diagnosis present

## 2020-05-13 DIAGNOSIS — H548 Legal blindness, as defined in USA: Secondary | ICD-10-CM | POA: Insufficient documentation

## 2020-06-09 ENCOUNTER — Other Ambulatory Visit: Payer: Self-pay

## 2020-06-09 ENCOUNTER — Emergency Department: Payer: Medicare (Managed Care)

## 2020-06-09 ENCOUNTER — Emergency Department
Admission: EM | Admit: 2020-06-09 | Discharge: 2020-06-09 | Disposition: A | Discharge status: Home / Self Care | Payer: Medicare (Managed Care) | Attending: Emergency Medicine | Admitting: Emergency Medicine

## 2020-06-09 DIAGNOSIS — F17210 Nicotine dependence, cigarettes, uncomplicated: Secondary | ICD-10-CM | POA: Diagnosis not present

## 2020-06-09 DIAGNOSIS — Z79899 Other long term (current) drug therapy: Secondary | ICD-10-CM | POA: Insufficient documentation

## 2020-06-09 DIAGNOSIS — Z7982 Long term (current) use of aspirin: Secondary | ICD-10-CM | POA: Insufficient documentation

## 2020-06-09 DIAGNOSIS — I10 Essential (primary) hypertension: Secondary | ICD-10-CM | POA: Diagnosis not present

## 2020-06-09 DIAGNOSIS — Z85118 Personal history of other malignant neoplasm of bronchus and lung: Secondary | ICD-10-CM | POA: Diagnosis not present

## 2020-06-09 DIAGNOSIS — J441 Chronic obstructive pulmonary disease with (acute) exacerbation: Secondary | ICD-10-CM | POA: Insufficient documentation

## 2020-06-09 DIAGNOSIS — R0602 Shortness of breath: Secondary | ICD-10-CM | POA: Diagnosis present

## 2020-06-09 LAB — BASIC METABOLIC PANEL
Anion gap: 11 (ref 5–15)
BUN: 11 mg/dL (ref 8–23)
CO2: 27 mmol/L (ref 22–32)
Calcium: 9.4 mg/dL (ref 8.9–10.3)
Chloride: 101 mmol/L (ref 98–111)
Creatinine, Ser: 0.63 mg/dL (ref 0.61–1.24)
GFR, Estimated: >60 mL/min (ref >60)
Glucose, Bld: 113 mg/dL — ABNORMAL HIGH (ref 70–99)
Potassium: 4.1 mmol/L (ref 3.5–5.1)
Sodium: 139 mmol/L (ref 135–145)

## 2020-06-09 LAB — CBC
HCT: 31.5 % — ABNORMAL LOW (ref 39.0–52.0)
Hemoglobin: 9.8 g/dL — ABNORMAL LOW (ref 13.0–17.0)
MCH: 22.8 pg — ABNORMAL LOW (ref 26.0–34.0)
MCHC: 31.1 g/dL (ref 30.0–36.0)
MCV: 73.3 fL — ABNORMAL LOW (ref 80.0–100.0)
Platelets: 237 10*3/uL (ref 150–400)
RBC: 4.3 MIL/uL (ref 4.22–5.81)
RDW: 18.3 % — ABNORMAL HIGH (ref 11.5–15.5)
WBC: 6 10*3/uL (ref 4.0–10.5)
nRBC: 0 % (ref 0.0–0.2)

## 2020-06-09 MED ORDER — IPRATROPIUM-ALBUTEROL 0.5-2.5 (3) MG/3ML IN SOLN
3.0000 mL | Freq: Once | RESPIRATORY_TRACT | Status: AC
  Administered 2020-06-09: 3 mL via RESPIRATORY_TRACT

## 2020-06-09 MED ORDER — IPRATROPIUM-ALBUTEROL 0.5-2.5 (3) MG/3ML IN SOLN
3.0000 mL | Freq: Once | RESPIRATORY_TRACT | Status: AC
  Administered 2020-06-09: 3 mL via RESPIRATORY_TRACT
  Filled 2020-06-09: qty 6

## 2020-06-09 MED ORDER — PREDNISONE 50 MG PO TABS: 50.0000 mg | ORAL_TABLET | Freq: Every day | ORAL | 0 refills | Status: DC

## 2020-06-09 NOTE — ED Notes (Signed)
Pt has been attempting to call for a ride

## 2020-06-09 NOTE — ED Notes (Signed)
Pt asleep.

## 2020-06-09 NOTE — ED Notes (Signed)
Pt assisted to toilet. Steady on feet, well tolerated.   Pt has great difficulty seeing. Poor visual acuity

## 2020-06-09 NOTE — ED Notes (Signed)
Pt reports worsening SOB over past few days, using inhaler with little or no relief. Pt states "I'm not normally on oxygen, but I think I might need to be." \  Pt does have congested non-productive cough. States it is mildly worsened from his baseline. Pt reports that his breathing has improved following EMS treatments. ]  Pt in subwait removed IV and oxygen. Was somewhat confused. Room air SpO2 difficult to obtain from poor pleth, sats 97-99% on 2L [place]

## 2020-06-09 NOTE — ED Provider Notes (Signed)
[facility] Emergency Department Provider Note   ____________________________________________    I have reviewed the triage vital signs and the nursing notes.   HISTORY  Chief Complaint Shortness of Breath     HPI Carl Bell is a 69 y.o. male with a history of COPD who presents with complaints of shortness of breath.  Patient reports he feels like he is having a COPD exacerbation.  Denies fevers chills, does have a dry cough.  No leg pain or swelling.  No sick contacts reported.  No nausea or vomiting or chest pain.  EMS gave Solu-Medrol and DuoNeb which she reports helped somewhat.   Past Medical History:  Diagnosis Date  . COPD (chronic obstructive pulmonary disease) ([place])   . Emphysema of lung ([place])   . Glaucoma   . Hypertension   . Lung cancer [facility])     Patient Active Problem List   Diagnosis Date Noted  . COPD exacerbation ([place]) 11/08/2019  . Acute exacerbation of chronic obstructive pulmonary disease (COPD) ([place]) 10/18/2019  . Hypertensive urgency 10/17/2019  . COPD with acute exacerbation ([place]) 09/12/2019  . Acute on chronic respiratory failure with hypoxia and hypercapnia ([place]) 09/12/2019  . Tobacco abuse 09/12/2019  . PAD (peripheral artery disease) ([place]) 09/12/2019  . HLD (hyperlipidemia) 09/12/2019  . HTN (hypertension) 09/12/2019  . Atherosclerosis of native arteries of the extremities with ulceration ([place]) 06/28/2019  . Sepsis ([place]) 12/24/2017  . Protein-calorie malnutrition, severe 11/12/2017  . Acute respiratory failure ([place]) 11/10/2017  . Acute respiratory distress 12/22/2015  . COPD (chronic obstructive pulmonary disease) ([place]) 12/22/2015  . Acute bronchitis 12/22/2015  . HTN, goal below 140/80 12/22/2015  . Lung cancer, lingula ([place]) 09/23/2015  . Chronic edema 09/15/2014    Past Surgical History:  Procedure Laterality Date  . LOWER EXTREMITY ANGIOGRAPHY Left 07/11/2019   Procedure: LOWER EXTREMITY ANGIOGRAPHY;   Surgeon: Algernon Huxley, MD;  Location: [place] CV LAB;  Service: Cardiovascular;  Laterality: Left;  . LUNG BIOPSY    . PERIPHERAL VASCULAR CATHETERIZATION N/A 09/24/2015   Procedure: [name];  Surgeon: Algernon Huxley, MD;  Location: [place] [facility];  Service: Cardiovascular;  Laterality: N/A;  . Port a cath placement      Prior to Admission medications   Medication Sig Start Date End Date Taking? Authorizing Provider  albuterol (PROVENTIL) (2.5 MG/3ML) 0.083% nebulizer solution Take 3 mLs (2.5 mg total) by nebulization every 6 (six) hours as needed for wheezing or shortness of breath. 10/18/19   Patrecia Pour, MD  albuterol (VENTOLIN HFA) 108 (90 Base) MCG/ACT inhaler Inhale 2 puffs into the lungs every 6 (six) hours as needed for wheezing or shortness of breath. 11/09/19   Jennye Boroughs, MD  amLODipine (NORVASC) 5 MG tablet Take 0.5 tablets (2.5 mg total) by mouth daily. 11/10/19 12/10/19  Jennye Boroughs, MD  aspirin EC 81 MG tablet Take 1 tablet (81 mg total) by mouth daily. 07/11/19   Algernon Huxley, MD  atorvastatin (LIPITOR) 10 MG tablet Take 1 tablet (10 mg total) by mouth daily. 07/11/19 07/10/20  Algernon Huxley, MD  Cholecalciferol (VITAMIN D-3) 125 MCG (5000 UT) TABS Take 5,000 Units by mouth daily.    [provider]  clopidogrel (PLAVIX) 75 MG tablet Take 1 tablet (75 mg total) by mouth daily. 07/11/19   Algernon Huxley, MD  COMBIGAN 0.2-0.5 % ophthalmic solution Place 1 drop into both eyes 2 (two) times daily.  08/15/19  [provider]  nicotine (NICODERM CQ - DOSED IN MG/24 HOURS) 21 mg/24hr patch Place 1 patch (21 mg total) onto the skin daily. 09/13/19   Sreenath, Trula Slade, MD  Omega-3 Fatty Acids (OMEGA-3 FISH OIL PO) Take 1 capsule by mouth daily.    [provider]  predniSONE (DELTASONE) 50 MG tablet Take 1 tablet (50 mg total) by mouth daily with breakfast. 06/09/20   Lavonia Drafts, MD  TRAVATAN Z 0.004 % SOLN ophthalmic solution Place 1  drop into both eyes at bedtime.  04/01/18   [provider]     Allergies Patient has no known allergies.  Family History  Problem Relation Age of Onset  . Diabetes Mellitus II Mother   . Hypertension Father     Social History Social History   Tobacco Use  . Smoking status: Current Every Day Smoker    Packs/day: 0.50    Years: 50.00    Pack years: 25.00    Types: Cigarettes  . Smokeless tobacco: Never Used  Vaping Use  . Vaping Use: Never used  Substance Use Topics  . Alcohol use: Yes    Alcohol/week: 0.0 standard drinks    Comment: 3 pints/week  . Drug use: No    Review of Systems  Constitutional: No fever/chills Eyes: No visual changes.  ENT: No sore throat. Cardiovascular: As above Respiratory: As above Gastrointestinal: No abdominal pain.  No nausea, no vomiting.   Genitourinary: Negative for dysuria. Musculoskeletal: Negative for back pain. Skin: Negative for rash. Neurological: Negative for headaches or weakness   ____________________________________________   PHYSICAL EXAM:  VITAL SIGNS: ED Triage Vitals  Enc Vitals Group     BP 06/09/20 1038 (!) 190/113     Pulse --      Resp 06/09/20 1038 (!) 24     Temp 06/09/20 1038 97.9 F (36.6 C)     Temp src --      SpO2 06/09/20 1038 91 %     Weight 06/09/20 1045 68 kg (150 lb)     Height 06/09/20 1045 1.854 m (6\' 1" )     Head Circumference --      Peak Flow --      Pain Score 06/09/20 1042 0     Pain Loc --      Pain Edu? --      Excl. in [place]? --     Constitutional: Alert and oriented.  Eyes: Conjunctivae are normal.  Head: Atraumatic. Nose: No congestion/rhinnorhea. Mouth/Throat: Mucous membranes are moist.   Neck:  Painless ROM Cardiovascular: Normal rate, regular rhythm. Grossly normal heart sounds.  Good peripheral circulation. Respiratory: Mild tachypnea, no retractions, diffuse wheezing Gastrointestinal: Soft and nontender. No distention.  No CVA tenderness. Genitourinary:  deferred Musculoskeletal: No lower extremity tenderness nor edema.  Warm and well perfused Neurologic:  Normal speech and language. No gross focal neurologic deficits are appreciated.  Skin:  Skin is warm, dry and intact. No rash noted. Psychiatric: Mood and affect are normal. Speech and behavior are normal.  ____________________________________________   LABS (all labs ordered are listed, but only abnormal results are displayed)  Labs Reviewed  BASIC METABOLIC PANEL - Abnormal; Notable for the following components:      Result Value   Glucose, Bld 113 (*)    All other components within normal limits  CBC - Abnormal; Notable for the following components:   Hemoglobin 9.8 (*)    HCT 31.5 (*)    MCV 73.3 (*)  MCH 22.8 (*)    RDW 18.3 (*)    All other components within normal limits   ____________________________________________  EKG  ED ECG REPORT I, Lavonia Drafts, the attending physician, personally viewed and interpreted this ECG.  Date: 06/09/2020  Rhythm: normal sinus rhythm QRS Axis: normal Intervals: normal ST/T Wave abnormalities: Nonspecific changes Narrative Interpretation: no evidence of acute ischemia  ____________________________________________  RADIOLOGY  Chest x-ray viewed by me, no evidence of pneumonia ____________________________________________   PROCEDURES  Procedure(s) performed: No  Procedures   Critical Care performed: No ____________________________________________   INITIAL IMPRESSION / ASSESSMENT AND PLAN / ED COURSE  Pertinent labs & imaging results that were available during my care of the patient were reviewed by me and considered in my medical decision making (see chart for details).  Patient presents with shortness of breath in the setting of COPD.  He is wheezing diffusely.  Presentation consistent with COPD exacerbation, has already received Solu-Medrol, will treat with DuoNeb's while we await labs  Lab work overall is  reassuring, normal white blood cell count.  Chest x-ray without evidence of pneumonia.  Patient is feeling somewhat improved.  After additional 2 DuoNeb's patient feeling much better, he states that he feels "100% better ".  Will discharge on prednisone, albuterol, strict return precautions of any worsening.    ____________________________________________   FINAL CLINICAL IMPRESSION(S) / ED DIAGNOSES  Final diagnoses:  COPD exacerbation ([place])        Note:  This document was prepared using Dragon voice recognition software and may include unintentional dictation errors.   Lavonia Drafts, MD 06/09/20 [phone]

## 2020-06-09 NOTE — ED Triage Notes (Signed)
Pt in via EMS from home with c/o COPD exacerbation that started Wednesday. Pt worsening over the last few days  5mg  of albuterol, 0.5 atrovent, 125mg  solumedrol, administered by EMS #18 to left AC. Pt c/o SOB, worse on exertion. Pt 94% on RA, HR 110 post treatment, BP 180/120

## 2020-06-09 NOTE — ED Notes (Signed)
This pt has assisted pt to dial pt contact in chart (mother) since he states she can coordinate transport for his brother to come get him. Called a few times, transport on the way now

## 2020-06-16 NOTE — Progress Notes (Signed)
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  INTERVAL HISTORY: Patient returns to clinic today for routine yearly evaluation.  He did not have his CT scan and this will be rescheduled in the next couple weeks.  He is in a wheelchair today, but states this is secondary to poor eyesight.  He otherwise feels well.  He has no neurologic complaints.  He denies any recent fevers or illnesses.  He has a good appetite and denies weight loss.  He has no chest pain, shortness of breath, cough, or hemoptysis. He denies any nausea, vomiting, constipation, or diarrhea. He has no urinary complaints.  Patient offers no further specific complaints today.  REVIEW OF SYSTEMS:   Review of Systems  Constitutional: Negative.  Negative for fever, malaise/fatigue and weight loss.  Respiratory: Negative.  Negative for cough, hemoptysis and shortness of breath.   Cardiovascular: Negative.  Negative for chest pain and leg swelling.  Gastrointestinal: Negative.  Negative for abdominal pain, blood in stool and melena.  Genitourinary: Negative.  Negative for dysuria.  Musculoskeletal: Negative.  Negative for back pain.  Skin: Negative.  Negative for rash.  Neurological: Negative.  Negative for sensory change, focal weakness and weakness.  Endo/Heme/Allergies: Does not bruise/bleed easily.  Psychiatric/Behavioral: Negative.  The patient is not nervous/anxious.     As per HPI. Otherwise, a complete review of systems is negative.  PAST MEDICAL HISTORY: Past Medical History:  Diagnosis Date  . COPD (chronic obstructive pulmonary disease) ([place])   . Emphysema of lung ([place])   . Glaucoma   . Hypertension   . Lung  cancer ([place])     PAST SURGICAL HISTORY: Past Surgical History:  Procedure Laterality Date  . LOWER EXTREMITY ANGIOGRAPHY Left 07/11/2019   Procedure: LOWER EXTREMITY ANGIOGRAPHY;  Surgeon: Algernon Huxley, MD;  Location: [place] [facility];  Service: Cardiovascular;  Laterality: Left;  . LUNG BIOPSY    . PERIPHERAL VASCULAR CATHETERIZATION N/A 09/24/2015   Procedure: [name];  Surgeon: Algernon Huxley, MD;  Location: [place] [facility];  Service: Cardiovascular;  Laterality: N/A;  . Port a cath placement      FAMILY HISTORY: Reviewed and unchanged. No reported history of malignancy or chronic disease.     ADVANCED DIRECTIVES:    HEALTH MAINTENANCE: Social History   Tobacco Use  . Smoking status: Current Every Day Smoker    Packs/day: 0.50    Years: 50.00    Pack years: 25.00    Types: Cigarettes  . Smokeless tobacco: Never Used  Vaping Use  . Vaping Use: Never used  Substance Use Topics  . Alcohol use: Yes    Alcohol/week: 0.0 standard drinks    Comment: 3 pints/week  . Drug use: No     No Known Allergies  Current Outpatient Medications  Medication Sig Dispense Refill  . albuterol (PROVENTIL) (2.5 MG/3ML) 0.083% nebulizer solution Take 3 mLs (2.5 mg total) by nebulization every 6 (six) hours as needed for wheezing or shortness of breath. 75 mL 0  . albuterol (VENTOLIN HFA) 108 (90 Base) MCG/ACT inhaler Inhale 2 puffs into the lungs every 6 (six) hours as needed for wheezing or shortness of breath. 8 g 0  . COMBIGAN  0.2-0.5 % ophthalmic solution Place 1 drop into both eyes 2 (two) times daily.     . TRAVATAN Z 0.004 % SOLN ophthalmic solution Place 1 drop into both eyes at bedtime.     Marland Kitchen amLODipine (NORVASC) 5 MG tablet Take 0.5 tablets (2.5 mg total) by mouth daily. 15 tablet 0  . aspirin EC 81 MG tablet Take 1 tablet (81 mg total) by mouth daily. (Patient not taking: Reported on 06/21/2020) 150 tablet 2  . atorvastatin (LIPITOR) 10 MG tablet Take 1 tablet  (10 mg total) by mouth daily. (Patient not taking: Reported on 06/21/2020) 30 tablet 11  . Cholecalciferol (VITAMIN D-3) 125 MCG (5000 UT) TABS Take 5,000 Units by mouth daily. (Patient not taking: Reported on 06/21/2020)    . clopidogrel (PLAVIX) 75 MG tablet Take 1 tablet (75 mg total) by mouth daily. (Patient not taking: Reported on 06/21/2020) 30 tablet 11  . nicotine (NICODERM CQ - DOSED IN MG/24 HOURS) 21 mg/24hr patch Place 1 patch (21 mg total) onto the skin daily. (Patient not taking: Reported on 06/21/2020) 28 patch 0  . Omega-3 Fatty Acids (OMEGA-3 FISH OIL PO) Take 1 capsule by mouth daily. (Patient not taking: Reported on 06/21/2020)    . predniSONE (DELTASONE) 50 MG tablet Take 1 tablet (50 mg total) by mouth daily with breakfast. (Patient not taking: Reported on 06/21/2020) 5 tablet 0   No current facility-administered medications for this visit.    OBJECTIVE: Vitals:   06/21/20 1056  BP: (!) 153/82  Pulse: (!) 102  Resp: 16  Temp: 97.9 F (36.6 C)  SpO2: 100%     Body mass index is 19.1 kg/m.    ECOG FS:0 - Asymptomatic  General: Well-developed, well-nourished, no acute distress. Eyes: Pink conjunctiva, anicteric sclera. HEENT: Normocephalic, moist mucous membranes. Lungs: No audible wheezing or coughing. Heart: Regular rate and rhythm. Abdomen: Soft, nontender, no obvious distention. Musculoskeletal: No edema, cyanosis, or clubbing. Neuro: Alert, answering all questions appropriately. Cranial nerves grossly intact. Skin: No rashes or petechiae noted. Psych: Normal affect.   LAB RESULTS:  Lab Results  Component Value Date   NA 139 06/09/2020   K 4.1 06/09/2020   CL 101 06/09/2020   CO2 27 06/09/2020   GLUCOSE 113 (H) 06/09/2020   BUN 11 06/09/2020   CREATININE 0.63 06/09/2020   CALCIUM 9.4 06/09/2020   PROT 6.9 09/12/2019   ALBUMIN 3.4 (L) 09/12/2019   AST 18 09/12/2019   ALT 9 09/12/2019   ALKPHOS 58 09/12/2019   BILITOT 0.7 09/12/2019   GFRNONAA >60  06/09/2020   GFRAA >60 11/09/2019    Lab Results  Component Value Date   WBC 6.0 06/09/2020   NEUTROABS 1.9 10/17/2019   HGB 9.8 (L) 06/09/2020   HCT 31.5 (L) 06/09/2020   MCV 73.3 (L) 06/09/2020   PLT 237 06/09/2020     STUDIES: DG Chest 2 View  Result Date: 06/09/2020 CLINICAL DATA:  Shortness of breath. EXAM: CHEST - 2 VIEW COMPARISON:  November 08, 2019 FINDINGS: Lungs remain hyperexpanded. Scarring left mid lung evident. Suspected underlying bullous disease, particularly in the upper lobes. Heart size is normal. Pulmonary vascularity is stable and is felt to reflect the underlying emphysematous changes. Port-A-Cath tip in superior vena cava. No adenopathy. No bone lesions. There is aortic atherosclerosis. IMPRESSION: Underlying changes indicative of emphysematous change. Scarring left mid lung. No edema or airspace opacity. Stable cardiac silhouette. Port-A-Cath tip in superior vena cava. There is aortic atherosclerosis. Aortic Atherosclerosis (ICD10-I70.0) and  Emphysema (ICD10-J43.9). Electronically Signed   By: Lowella Grip III M.D.   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  PLAN:    1. 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 Patient only received one infusion of consolidation chemotherapy on December 27, 2015, but this was discontinued secondary to persistent pancytopenia.  Patient's most recent CT scan on May 20, 2019 reviewed independently with no obvious evidence of recurrent or progressive disease.  Patient will require repeat CT scan in the next several weeks with follow-up 1 to 2 days later.  Will continue yearly imaging and evaluation for 1 more year at which point patient will be 5 years removed from his treatment and can be discharged from clinic.  2.  Cough: Patient does not complain of this today. 3.  Leukopenia: Resolved. 4.  Anemia:  Chronic and unchanged.  Patient's hemoglobin is 9.8.  Patient expressed understanding and was in agreement with this plan. He also understands that He can call clinic at any time with any questions, concerns, or complaints.   Lloyd Huger, MD 06/22/20 6:52 AM

## 2020-06-21 ENCOUNTER — Inpatient Hospital Stay: Payer: Medicare (Managed Care) | Attending: Oncology | Admitting: Oncology

## 2020-06-21 ENCOUNTER — Telehealth: Payer: Self-pay | Admitting: Internal Medicine

## 2020-06-21 ENCOUNTER — Other Ambulatory Visit: Payer: Self-pay

## 2020-06-21 ENCOUNTER — Encounter: Payer: Self-pay | Admitting: Oncology

## 2020-06-21 VITALS — BP 153/82 | HR 102 | Temp 97.9°F | Resp 16 | Wt 144.8 lb

## 2020-06-21 DIAGNOSIS — F17210 Nicotine dependence, cigarettes, uncomplicated: Secondary | ICD-10-CM | POA: Insufficient documentation

## 2020-06-21 DIAGNOSIS — Z79899 Other long term (current) drug therapy: Secondary | ICD-10-CM | POA: Insufficient documentation

## 2020-06-21 DIAGNOSIS — D649 Anemia, unspecified: Secondary | ICD-10-CM | POA: Diagnosis not present

## 2020-06-21 DIAGNOSIS — Z85118 Personal history of other malignant neoplasm of bronchus and lung: Secondary | ICD-10-CM | POA: Insufficient documentation

## 2020-06-21 DIAGNOSIS — C3410 Malignant neoplasm of upper lobe, unspecified bronchus or lung: Secondary | ICD-10-CM

## 2020-07-07 DIAGNOSIS — Z85118 Personal history of other malignant neoplasm of bronchus and lung: Secondary | ICD-10-CM | POA: Insufficient documentation

## 2020-07-07 DIAGNOSIS — Z7901 Long term (current) use of anticoagulants: Secondary | ICD-10-CM | POA: Insufficient documentation

## 2020-07-07 DIAGNOSIS — Z7982 Long term (current) use of aspirin: Secondary | ICD-10-CM | POA: Insufficient documentation

## 2020-07-07 DIAGNOSIS — J441 Chronic obstructive pulmonary disease with (acute) exacerbation: Secondary | ICD-10-CM | POA: Insufficient documentation

## 2020-07-07 DIAGNOSIS — R0602 Shortness of breath: Secondary | ICD-10-CM | POA: Diagnosis present

## 2020-07-07 DIAGNOSIS — F17210 Nicotine dependence, cigarettes, uncomplicated: Secondary | ICD-10-CM | POA: Diagnosis not present

## 2020-07-07 DIAGNOSIS — I10 Essential (primary) hypertension: Secondary | ICD-10-CM | POA: Insufficient documentation

## 2020-07-07 DIAGNOSIS — Z20822 Contact with and (suspected) exposure to covid-19: Secondary | ICD-10-CM | POA: Diagnosis not present

## 2020-07-07 DIAGNOSIS — Z79899 Other long term (current) drug therapy: Secondary | ICD-10-CM | POA: Diagnosis not present

## 2020-07-07 NOTE — ED Triage Notes (Signed)
Pt BIB [facility] for shortness of breath/difficulty breathing. EMS states pt had a breathing treatment earlier and did not come in and that he called EMS again tonight for shortness of breath and got a nebulized treatment. Pt has history of COPD and lung cancer. EMS states pts at home inhaler is not effective.  Vitals for EMS: BP: 194/98 HR: 96 O2: 99

## 2020-07-08 ENCOUNTER — Other Ambulatory Visit: Payer: Self-pay

## 2020-07-08 ENCOUNTER — Encounter: Payer: Self-pay | Admitting: Radiology

## 2020-07-08 ENCOUNTER — Emergency Department: Payer: Medicare (Managed Care)

## 2020-07-08 ENCOUNTER — Emergency Department
Admission: EM | Admit: 2020-07-08 | Discharge: 2020-07-08 | Disposition: A | Discharge status: Home / Self Care | Payer: Medicare (Managed Care) | Attending: Emergency Medicine | Admitting: Emergency Medicine

## 2020-07-08 DIAGNOSIS — J441 Chronic obstructive pulmonary disease with (acute) exacerbation: Secondary | ICD-10-CM

## 2020-07-08 LAB — BASIC METABOLIC PANEL
Anion gap: 10 (ref 5–15)
BUN: 15 mg/dL (ref 8–23)
CO2: 26 mmol/L (ref 22–32)
Calcium: 9.3 mg/dL (ref 8.9–10.3)
Chloride: 104 mmol/L (ref 98–111)
Creatinine, Ser: 0.86 mg/dL (ref 0.61–1.24)
GFR, Estimated: >60 mL/min (ref >60)
Glucose, Bld: 93 mg/dL (ref 70–99)
Potassium: 4 mmol/L (ref 3.5–5.1)
Sodium: 140 mmol/L (ref 135–145)

## 2020-07-08 LAB — CBC
HCT: 30.8 % — ABNORMAL LOW (ref 39.0–52.0)
Hemoglobin: 9.7 g/dL — ABNORMAL LOW (ref 13.0–17.0)
MCH: 22.9 pg — ABNORMAL LOW (ref 26.0–34.0)
MCHC: 31.5 g/dL (ref 30.0–36.0)
MCV: 72.8 fL — ABNORMAL LOW (ref 80.0–100.0)
Platelets: 202 10*3/uL (ref 150–400)
RBC: 4.23 MIL/uL (ref 4.22–5.81)
RDW: 19.8 % — ABNORMAL HIGH (ref 11.5–15.5)
WBC: 3 10*3/uL — ABNORMAL LOW (ref 4.0–10.5)
nRBC: 0 % (ref 0.0–0.2)

## 2020-07-08 LAB — RESP PANEL BY RT-PCR (FLU A&B, COVID) ARPGX2
Influenza A by PCR: NEGATIVE
Influenza B by PCR: NEGATIVE
SARS Coronavirus 2 by RT PCR: NEGATIVE

## 2020-07-08 MED ORDER — PREDNISONE 20 MG PO TABS: 60.0000 mg | ORAL_TABLET | Freq: Every day | ORAL | 0 refills | Status: DC

## 2020-07-08 MED ORDER — ALBUTEROL SULFATE HFA 108 (90 BASE) MCG/ACT IN AERS: INHALATION_SPRAY | RESPIRATORY_TRACT | 0 refills | Status: DC

## 2020-07-08 MED ORDER — IPRATROPIUM-ALBUTEROL 0.5-2.5 (3) MG/3ML IN SOLN
3.0000 mL | Freq: Once | RESPIRATORY_TRACT | Status: AC
  Administered 2020-07-08: 3 mL via RESPIRATORY_TRACT
  Filled 2020-07-08: qty 3

## 2020-07-08 MED ORDER — PREDNISONE 20 MG PO TABS
60.0000 mg | ORAL_TABLET | ORAL | Status: AC
  Administered 2020-07-08: 60 mg via ORAL
  Filled 2020-07-08: qty 3

## 2020-07-08 NOTE — ED Provider Notes (Signed)
[facility] Emergency Department Provider Note  ____________________________________________   Event Date/Time   First MD Initiated Contact with Patient 07/08/20 0244     (approximate)  I have reviewed the triage vital signs and the nursing notes.   HISTORY  Chief Complaint Shortness of Breath    HPI Carl Bell is a 69 y.o. male with history of COPD without the use of chronic oxygen supplementation and a prior history of lung cancer for which he is followed by Dr. Grayland Ormond.  He presents for gradually worsening shortness of breath  over the last few days.  He said that he ran out of his albuterol inhaler at home.  EMS came out and give him a breathing treatment earlier and the patient refused transfer but then it got worse again tonight.  He said he feels better now.  He denies fever/chills, sore throat, chest pain, nausea, vomiting, and abdominal pain.  He is not having a significant amount of cough, just a little bit.  Exertion makes his shortness of breath worse.  No nausea, vomiting, nor abdominal pain.        Past Medical History:  Diagnosis Date  . COPD (chronic obstructive pulmonary disease) ([place])   . Emphysema of lung ([place])   . Glaucoma   . Hypertension   . Lung cancer [facility])     Patient Active Problem List   Diagnosis Date Noted  . COPD exacerbation ([place]) 11/08/2019  . Acute exacerbation of chronic obstructive pulmonary disease (COPD) ([place]) 10/18/2019  . Hypertensive urgency 10/17/2019  . COPD with acute exacerbation ([place]) 09/12/2019  . Acute on chronic respiratory failure with hypoxia and hypercapnia ([place]) 09/12/2019  . Tobacco abuse 09/12/2019  . PAD (peripheral artery disease) ([place]) 09/12/2019  . HLD (hyperlipidemia) 09/12/2019  . HTN (hypertension) 09/12/2019  . Atherosclerosis of native arteries of the extremities with ulceration ([place]) 06/28/2019  . Sepsis ([place]) 12/24/2017  . Protein-calorie malnutrition, severe 11/12/2017  .  Acute respiratory failure ([place]) 11/10/2017  . Acute respiratory distress 12/22/2015  . COPD (chronic obstructive pulmonary disease) ([place]) 12/22/2015  . Acute bronchitis 12/22/2015  . HTN, goal below 140/80 12/22/2015  . Lung cancer, lingula ([place]) 09/23/2015  . Chronic edema 09/15/2014    Past Surgical History:  Procedure Laterality Date  . LOWER EXTREMITY ANGIOGRAPHY Left 07/11/2019   Procedure: LOWER EXTREMITY ANGIOGRAPHY;  Surgeon: Algernon Huxley, MD;  Location: [place] [facility];  Service: Cardiovascular;  Laterality: Left;  . LUNG BIOPSY    . PERIPHERAL VASCULAR CATHETERIZATION N/A 09/24/2015   Procedure: [name];  Surgeon: Algernon Huxley, MD;  Location: [place] [facility];  Service: Cardiovascular;  Laterality: N/A;  . Port a cath placement      Prior to Admission medications   Medication Sig Start Date End Date Taking? Authorizing Provider  albuterol (VENTOLIN HFA) 108 (90 Base) MCG/ACT inhaler Inhale 2-4 puffs by mouth every 4 hours as needed for wheezing, cough, and/or shortness of breath 07/08/20   Hinda Kehr, MD  amLODipine (NORVASC) 5 MG tablet Take 0.5 tablets (2.5 mg total) by mouth daily. 11/10/19 12/10/19  Jennye Boroughs, MD  aspirin EC 81 MG tablet Take 1 tablet (81 mg total) by mouth daily. Patient not taking: Reported on 06/21/2020 07/11/19   Algernon Huxley, MD  atorvastatin (LIPITOR) 10 MG tablet Take 1 tablet (10 mg total) by mouth daily. Patient not taking: Reported on 06/21/2020 07/11/19 07/10/20  Algernon Huxley, MD  Cholecalciferol (VITAMIN D-3) 125  MCG (5000 UT) TABS Take 5,000 Units by mouth daily. Patient not taking: Reported on 06/21/2020    [provider]  clopidogrel (PLAVIX) 75 MG tablet Take 1 tablet (75 mg total) by mouth daily. Patient not taking: Reported on 06/21/2020 07/11/19   Algernon Huxley, MD  COMBIGAN 0.2-0.5 % ophthalmic solution Place 1 drop into both eyes 2 (two) times daily.  08/15/19   [provider]  nicotine  (NICODERM CQ - DOSED IN MG/24 HOURS) 21 mg/24hr patch Place 1 patch (21 mg total) onto the skin daily. Patient not taking: Reported on 06/21/2020 09/13/19   Sidney Ace, MD  Omega-3 Fatty Acids (OMEGA-3 FISH OIL PO) Take 1 capsule by mouth daily. Patient not taking: Reported on 06/21/2020    [provider]  predniSONE (DELTASONE) 20 MG tablet Take 3 tablets (60 mg total) by mouth daily. 07/08/20   Hinda Kehr, MD  TRAVATAN Z 0.004 % SOLN ophthalmic solution Place 1 drop into both eyes at bedtime.  04/01/18   [provider]    Allergies Patient has no known allergies.  Family History  Problem Relation Age of Onset  . Diabetes Mellitus II Mother   . Hypertension Father     Social History Social History   Tobacco Use  . Smoking status: Current Every Day Smoker    Packs/day: 0.50    Years: 50.00    Pack years: 25.00    Types: Cigarettes  . Smokeless tobacco: Never Used  Vaping Use  . Vaping Use: Never used  Substance Use Topics  . Alcohol use: Yes    Alcohol/week: 0.0 standard drinks    Comment: 3 pints/week  . Drug use: No    Review of Systems Constitutional: No fever/chills Eyes: No visual changes. ENT: No sore throat. Cardiovascular: Denies chest pain. Respiratory: +Shortness of breath Gastrointestinal: No abdominal pain.  No nausea, no vomiting.  No diarrhea.  No constipation. Genitourinary: Negative for dysuria. Musculoskeletal: Negative for neck pain.  Negative for back pain. Integumentary: Negative for rash. Neurological: Negative for headaches, focal weakness or numbness.   ____________________________________________   PHYSICAL EXAM:  VITAL SIGNS: ED Triage Vitals  Enc Vitals Group     BP 07/08/20 0007 (!) 141/90     Pulse Rate 07/08/20 0007 83     Resp 07/08/20 0007 20     Temp 07/08/20 0007 98 F (36.7 C)     Temp Source 07/08/20 0007 Oral     SpO2 07/08/20 0007 99 %     Weight 07/08/20 0007 65.8 kg (145 lb)     Height  07/08/20 0007 1.854 m (6\' 1" )     Head Circumference --      Peak Flow --      Pain Score 07/08/20 0005 0     Pain Loc --      Pain Edu? --      Excl. in [place]? --     Constitutional: Alert and oriented.  Eyes: Conjunctivae are normal.  Head: Atraumatic. Nose: No congestion/rhinnorhea. Mouth/Throat: Patient is wearing a mask. Neck: No stridor.  No meningeal signs.   Cardiovascular: Normal rate, regular rhythm. Good peripheral circulation. Respiratory: Normal respiratory effort.  Mild expiratory wheezing and some coarse breath sounds upon auscultation, but no accessory muscle usage. Gastrointestinal: Soft and nontender. No distention.  Musculoskeletal: No lower extremity tenderness nor edema. No gross deformities of extremities. Neurologic:  Normal speech and language. No gross focal neurologic deficits are appreciated.  Skin:  Skin is  warm, dry and intact. Psychiatric: Mood and affect are normal. Speech and behavior are normal.  ____________________________________________   LABS (all labs ordered are listed, but only abnormal results are displayed)  Labs Reviewed  CBC - Abnormal; Notable for the following components:      Result Value   WBC 3.0 (*)    Hemoglobin 9.7 (*)    HCT 30.8 (*)    MCV 72.8 (*)    MCH 22.9 (*)    RDW 19.8 (*)    All other components within normal limits  RESP PANEL BY RT-PCR (FLU A&B, COVID) [id number]  BASIC METABOLIC PANEL   ____________________________________________  EKG  ED ECG REPORT I, Hinda Kehr, the attending physician, personally viewed and interpreted this ECG.  Date: 07/08/2020 EKG Time: 00: 14 Rate: 86 Rhythm: normal sinus rhythm QRS Axis: Right axis deviation Intervals: normal ST/T Wave abnormalities: Non-specific ST segment / T-wave changes, but no clear evidence of acute ischemia. Narrative Interpretation: no definitive evidence of acute ischemia; does not meet STEMI  criteria.   ____________________________________________  RADIOLOGY I, Hinda Kehr, personally viewed and evaluated these images (plain radiographs) as part of my medical decision making, as well as reviewing the written report by the radiologist.  ED MD interpretation: No acute abnormality identified on chest x-ray  Official radiology report(s): DG Chest 2 View  Result Date: 07/08/2020 CLINICAL DATA:  Shortness of breath EXAM: CHEST - 2 VIEW COMPARISON:  June 09, 2020 FINDINGS: The lungs are hyperinflated. Stable right-sided venous Port-A-Cath positioning is seen. Mild, stable linear scarring and/or atelectasis is seen within the mid left lung. There is no evidence of acute infiltrate, pleural effusion or pneumothorax. The heart size and mediastinal contours are within normal limits. The visualized skeletal structures are unremarkable. IMPRESSION: Stable exam without active cardiopulmonary disease. Electronically Signed   By: Virgina Norfolk M.D.   On: 07/08/2020 01:17    ____________________________________________   PROCEDURES   Procedure(s) performed (including Critical Care):  Procedures   ____________________________________________   INITIAL IMPRESSION / MDM / Novelty / ED COURSE  As part of my medical decision making, I reviewed the following data within the [facility] notes reviewed and incorporated, Labs reviewed , EKG interpreted , Old chart reviewed, Radiograph reviewed  and Notes from prior ED visits   Differential diagnosis includes, but is not limited to, COPD exacerbation, COVID-19, pneumonia, other respiratory viral infection.  Patient's COVID-19 test is negative.  Vital signs are stable, no hypoxemia, no tachycardia.  Expiratory wheezing on exam.  I personally reviewed the patient's imaging and agree with the radiologist's interpretation that there is no acute abnormality such as pneumonia or pneumothorax on the chest  x-ray.  CBC is normal other than mild leukopenia.  Basic metabolic panel normal.  Patient was feeling much better after a breathing treatment by EMS and was sleeping comfortably when I checked on him.  I gave him 2 additional duo nebs and a first dose of prednisone.  I will prescribe him another albuterol inhaler as well as a course of prednisone.  He understands and agrees with the plan and the need for outpatient follow-up.  I gave my usual and customary return precautions.        Clinical Course as of 07/08/20 1607  Nancy Fetter Jul 08, 2020  0319 SARS Coronavirus 2 by RT PCR: NEGATIVE [CF]    Clinical Course User Index [CF] Hinda Kehr, MD     ____________________________________________  FINAL CLINICAL IMPRESSION(S) / ED  DIAGNOSES  Final diagnoses:  COPD exacerbation ([place])     MEDICATIONS GIVEN DURING THIS VISIT:  Medications  ipratropium-albuterol (DUONEB) 0.5-2.5 (3) MG/3ML nebulizer solution 3 mL (3 mLs Nebulization Given 07/08/20 0406)  ipratropium-albuterol (DUONEB) 0.5-2.5 (3) MG/3ML nebulizer solution 3 mL (3 mLs Nebulization Given 07/08/20 0405)  predniSONE (DELTASONE) tablet 60 mg (60 mg Oral Given 07/08/20 0404)     ED Discharge Orders         Ordered    predniSONE (DELTASONE) 20 MG tablet  Daily        07/08/20 0427    albuterol (VENTOLIN HFA) 108 (90 Base) MCG/ACT inhaler       Note to Pharmacy: Pharmacy may substitute brand and size for insurance-approved equivalent   07/08/20 0427          *Please note:  Carl Bell was evaluated in Emergency Department on 07/08/2020 for the symptoms described in the history of present illness. He was evaluated in the context of the global COVID-19 pandemic, which necessitated consideration that the patient might be at risk for infection with the SARS-CoV-2 virus that causes COVID-19. Institutional protocols and algorithms that pertain to the evaluation of patients at risk for COVID-19 are in a state of rapid change based  on information released by regulatory bodies including the CDC and federal and state organizations. These policies and algorithms were followed during the patient's care in the ED.  Some ED evaluations and interventions may be delayed as a result of limited staffing during and after the pandemic.*  Note:  This document was prepared using Dragon voice recognition software and may include unintentional dictation errors.   Hinda Kehr, MD 07/08/20 [phone]

## 2020-07-24 ENCOUNTER — Ambulatory Visit: Payer: Medicare (Managed Care)

## 2020-07-27 ENCOUNTER — Ambulatory Visit: Payer: Medicare (Managed Care) | Admitting: Oncology

## 2020-08-03 ENCOUNTER — Emergency Department: Payer: Medicare (Managed Care)

## 2020-08-03 ENCOUNTER — Encounter: Payer: Self-pay | Admitting: Emergency Medicine

## 2020-08-03 ENCOUNTER — Other Ambulatory Visit: Payer: Self-pay

## 2020-08-03 ENCOUNTER — Inpatient Hospital Stay
Admission: EM | Admit: 2020-08-03 | Discharge: 2020-08-08 | DRG: 177 | Disposition: A | Discharge status: Home Health | Payer: Medicare (Managed Care) | Attending: Internal Medicine | Admitting: Internal Medicine

## 2020-08-03 DIAGNOSIS — Z85118 Personal history of other malignant neoplasm of bronchus and lung: Secondary | ICD-10-CM

## 2020-08-03 DIAGNOSIS — F17210 Nicotine dependence, cigarettes, uncomplicated: Secondary | ICD-10-CM | POA: Diagnosis present

## 2020-08-03 DIAGNOSIS — J441 Chronic obstructive pulmonary disease with (acute) exacerbation: Secondary | ICD-10-CM

## 2020-08-03 DIAGNOSIS — I739 Peripheral vascular disease, unspecified: Secondary | ICD-10-CM | POA: Diagnosis present

## 2020-08-03 DIAGNOSIS — I248 Other forms of acute ischemic heart disease: Secondary | ICD-10-CM | POA: Diagnosis present

## 2020-08-03 DIAGNOSIS — I249 Acute ischemic heart disease, unspecified: Secondary | ICD-10-CM | POA: Diagnosis not present

## 2020-08-03 DIAGNOSIS — R9431 Abnormal electrocardiogram [ECG] [EKG]: Secondary | ICD-10-CM | POA: Diagnosis not present

## 2020-08-03 DIAGNOSIS — J9601 Acute respiratory failure with hypoxia: Secondary | ICD-10-CM | POA: Diagnosis not present

## 2020-08-03 DIAGNOSIS — R0602 Shortness of breath: Secondary | ICD-10-CM | POA: Diagnosis not present

## 2020-08-03 DIAGNOSIS — Z9221 Personal history of antineoplastic chemotherapy: Secondary | ICD-10-CM

## 2020-08-03 DIAGNOSIS — Z7982 Long term (current) use of aspirin: Secondary | ICD-10-CM

## 2020-08-03 DIAGNOSIS — U071 COVID-19: Secondary | ICD-10-CM | POA: Diagnosis present

## 2020-08-03 DIAGNOSIS — Z923 Personal history of irradiation: Secondary | ICD-10-CM | POA: Diagnosis not present

## 2020-08-03 DIAGNOSIS — I10 Essential (primary) hypertension: Secondary | ICD-10-CM | POA: Diagnosis not present

## 2020-08-03 DIAGNOSIS — E785 Hyperlipidemia, unspecified: Secondary | ICD-10-CM | POA: Diagnosis present

## 2020-08-03 DIAGNOSIS — I2489 Other forms of acute ischemic heart disease: Secondary | ICD-10-CM

## 2020-08-03 DIAGNOSIS — Z7952 Long term (current) use of systemic steroids: Secondary | ICD-10-CM

## 2020-08-03 DIAGNOSIS — J439 Emphysema, unspecified: Secondary | ICD-10-CM | POA: Diagnosis present

## 2020-08-03 DIAGNOSIS — H409 Unspecified glaucoma: Secondary | ICD-10-CM | POA: Diagnosis present

## 2020-08-03 DIAGNOSIS — Z79899 Other long term (current) drug therapy: Secondary | ICD-10-CM

## 2020-08-03 DIAGNOSIS — Z7902 Long term (current) use of antithrombotics/antiplatelets: Secondary | ICD-10-CM

## 2020-08-03 DIAGNOSIS — Z8249 Family history of ischemic heart disease and other diseases of the circulatory system: Secondary | ICD-10-CM | POA: Diagnosis not present

## 2020-08-03 DIAGNOSIS — R778 Other specified abnormalities of plasma proteins: Secondary | ICD-10-CM

## 2020-08-03 DIAGNOSIS — I361 Nonrheumatic tricuspid (valve) insufficiency: Secondary | ICD-10-CM | POA: Diagnosis not present

## 2020-08-03 LAB — COMPREHENSIVE METABOLIC PANEL
ALT: 14 U/L (ref 0–44)
AST: 23 U/L (ref 15–41)
Albumin: 3.8 g/dL (ref 3.5–5.0)
Alkaline Phosphatase: 58 U/L (ref 38–126)
Anion gap: 13 (ref 5–15)
BUN: 13 mg/dL (ref 8–23)
CO2: 24 mmol/L (ref 22–32)
Calcium: 9.3 mg/dL (ref 8.9–10.3)
Chloride: 99 mmol/L (ref 98–111)
Creatinine, Ser: 0.84 mg/dL (ref 0.61–1.24)
GFR, Estimated: >60 mL/min (ref >60)
Glucose, Bld: 118 mg/dL — ABNORMAL HIGH (ref 70–99)
Potassium: 4.1 mmol/L (ref 3.5–5.1)
Sodium: 136 mmol/L (ref 135–145)
Total Bilirubin: 1.8 mg/dL — ABNORMAL HIGH (ref 0.3–1.2)
Total Protein: 7.3 g/dL (ref 6.5–8.1)

## 2020-08-03 LAB — IRON AND TIBC
Iron: 77 ug/dL (ref 45–182)
Saturation Ratios: 17 % — ABNORMAL LOW (ref 17.9–39.5)
TIBC: 451 ug/dL — ABNORMAL HIGH (ref 250–450)
UIBC: 374 ug/dL

## 2020-08-03 LAB — BLOOD GAS, ARTERIAL
Acid-base deficit: 0 mmol/L (ref 0.0–2.0)
Bicarbonate: 23.6 mmol/L (ref 20.0–28.0)
FIO2: 0.21
O2 Saturation: 96.5 %
Patient temperature: 37
pCO2 arterial: 34 mmHg (ref 32.0–48.0)
pH, Arterial: 7.45 (ref 7.350–7.450)
pO2, Arterial: 82 mmHg — ABNORMAL LOW (ref 83.0–108.0)

## 2020-08-03 LAB — CBC WITH DIFFERENTIAL/PLATELET
Abs Immature Granulocytes: 0.01 10*3/uL (ref 0.00–0.07)
Basophils Absolute: 0 10*3/uL (ref 0.0–0.1)
Basophils Relative: 0 %
Eosinophils Absolute: 0.1 10*3/uL (ref 0.0–0.5)
Eosinophils Relative: 1 %
HCT: 32.5 % — ABNORMAL LOW (ref 39.0–52.0)
Hemoglobin: 10.1 g/dL — ABNORMAL LOW (ref 13.0–17.0)
Immature Granulocytes: 0 %
Lymphocytes Relative: 5 %
Lymphs Abs: 0.3 10*3/uL — ABNORMAL LOW (ref 0.7–4.0)
MCH: 22.4 pg — ABNORMAL LOW (ref 26.0–34.0)
MCHC: 31.1 g/dL (ref 30.0–36.0)
MCV: 72.2 fL — ABNORMAL LOW (ref 80.0–100.0)
Monocytes Absolute: 0.7 10*3/uL (ref 0.1–1.0)
Monocytes Relative: 12 %
Neutro Abs: 4.9 10*3/uL (ref 1.7–7.7)
Neutrophils Relative %: 82 %
Platelets: 182 10*3/uL (ref 150–400)
RBC: 4.5 MIL/uL (ref 4.22–5.81)
RDW: 21.1 % — ABNORMAL HIGH (ref 11.5–15.5)
Smear Review: NORMAL
WBC: 6.1 10*3/uL (ref 4.0–10.5)
nRBC: 0 % (ref 0.0–0.2)

## 2020-08-03 LAB — TROPONIN I (HIGH SENSITIVITY)
Troponin I (High Sensitivity): 8 ng/L (ref <18)
Troponin I (High Sensitivity): 25 ng/L — ABNORMAL HIGH (ref <18)
Troponin I (High Sensitivity): 26 ng/L — ABNORMAL HIGH (ref <18)
Troponin I (High Sensitivity): 18 ng/L — ABNORMAL HIGH (ref <18)

## 2020-08-03 LAB — TSH: TSH: 0.292 u[IU]/mL — ABNORMAL LOW (ref 0.350–4.500)

## 2020-08-03 LAB — BRAIN NATRIURETIC PEPTIDE: B Natriuretic Peptide: 653.2 pg/mL — ABNORMAL HIGH (ref 0.0–100.0)

## 2020-08-03 LAB — PROTIME-INR
INR: 1.2 (ref 0.8–1.2)
Prothrombin Time: 14.3 seconds (ref 11.4–15.2)

## 2020-08-03 LAB — HEMOGLOBIN A1C
Hgb A1c MFr Bld: 5.3 % (ref 4.8–5.6)
Mean Plasma Glucose: 105.41 mg/dL

## 2020-08-03 LAB — POC SARS CORONAVIRUS 2 AG -  ED: SARS Coronavirus 2 Ag: POSITIVE — AB

## 2020-08-03 LAB — FIBRIN DERIVATIVES D-DIMER (ARMC ONLY): Fibrin derivatives D-dimer (ARMC): 1229.33 ng/mL (FEU) — ABNORMAL HIGH (ref 0.00–499.00)

## 2020-08-03 LAB — C-REACTIVE PROTEIN: CRP: 2.5 mg/dL — ABNORMAL HIGH (ref <1.0)

## 2020-08-03 LAB — CBG MONITORING, ED: Glucose-Capillary: 123 mg/dL — ABNORMAL HIGH (ref 70–99)

## 2020-08-03 LAB — FERRITIN: Ferritin: 21 ng/mL — ABNORMAL LOW (ref 24–336)

## 2020-08-03 MED ORDER — ASPIRIN 81 MG PO CHEW
324.0000 mg | CHEWABLE_TABLET | Freq: Once | ORAL | Status: AC
  Administered 2020-08-03: 324 mg via ORAL
  Filled 2020-08-03: qty 4

## 2020-08-03 MED ORDER — ENOXAPARIN SODIUM 80 MG/0.8ML ~~LOC~~ SOLN
1.0000 mg/kg | Freq: Two times a day (BID) | SUBCUTANEOUS | Status: DC
  Administered 2020-08-03 – 2020-08-06 (×6): 70 mg via SUBCUTANEOUS
  Filled 2020-08-03 (×8): qty 0.8

## 2020-08-03 MED ORDER — METOPROLOL TARTRATE 25 MG PO TABS
12.5000 mg | ORAL_TABLET | Freq: Two times a day (BID) | ORAL | Status: DC
  Administered 2020-08-03 – 2020-08-08 (×11): 12.5 mg via ORAL
  Filled 2020-08-03 (×11): qty 1

## 2020-08-03 MED ORDER — IPRATROPIUM-ALBUTEROL 0.5-2.5 (3) MG/3ML IN SOLN
3.0000 mL | Freq: Once | RESPIRATORY_TRACT | Status: AC
  Administered 2020-08-03: 3 mL via RESPIRATORY_TRACT
  Filled 2020-08-03: qty 3

## 2020-08-03 MED ORDER — PREDNISONE 20 MG PO TABS
60.0000 mg | ORAL_TABLET | Freq: Once | ORAL | Status: AC
  Administered 2020-08-03: 60 mg via ORAL
  Filled 2020-08-03: qty 3

## 2020-08-03 MED ORDER — ONDANSETRON HCL 4 MG/2ML IJ SOLN: 4.0000 mg | Freq: Four times a day (QID) | INTRAMUSCULAR | Status: DC | PRN

## 2020-08-03 MED ORDER — ACETAMINOPHEN 325 MG PO TABS
650.0000 mg | ORAL_TABLET | Freq: Once | ORAL | Status: AC
  Administered 2020-08-03: 650 mg via ORAL
  Filled 2020-08-03: qty 2

## 2020-08-03 MED ORDER — ASPIRIN EC 81 MG PO TBEC
81.0000 mg | DELAYED_RELEASE_TABLET | Freq: Every day | ORAL | Status: DC
  Administered 2020-08-04 – 2020-08-08 (×5): 81 mg via ORAL
  Filled 2020-08-03 (×5): qty 1

## 2020-08-03 MED ORDER — ACETAMINOPHEN 325 MG PO TABS: 650.0000 mg | ORAL_TABLET | ORAL | Status: DC | PRN

## 2020-08-03 MED ORDER — NITROGLYCERIN 0.4 MG SL SUBL: 0.4000 mg | SUBLINGUAL_TABLET | SUBLINGUAL | Status: DC | PRN

## 2020-08-03 NOTE — H&P (Addendum)
History and Physical    Carl Bell JXB:[id number] DOB: April 21, 1951 DOA: 08/03/2020  PCP: Kirk Ruths, MD  Patient coming from: home  I have personally briefly reviewed patient's old medical records in [place]  Chief Complaint: sob,cough ,fever in setting of COVID exposure  HPI: Carl Bell is a 70 y.o. male with medical history significant of  COPD not on home O2, HTN, Anemia,Lung CA Status post chemotherapy and radiotherapy in 2017 with no recurrence,continued tobacco abuse  Patient presents to ED BIBEMS with complaint of sob/severe cough/ fever x 2 days. In the field per EMS saturation 93% on ra , patient placed on 3L with improvement to 100%. Patient notes he has contact with sister who was recently diagnosed with COVID 58.  He notes no associated chest pain other than with coughing. He also noted no sore throat ,myalgias, n/v/d/abdominal pain or dysuria.   ED Course:  Temp 99.2, BP168/108- 147/87, [id number], rr19 Sat 94% on ra Labs: Wbc:6.1, hgb 10.1, MCV 72,2 plt 182 NA:136, K 4.1, CL99, glu 118 ZHY:[id number] without acute superimposed finding. Covid:+ EKG NSR-st depression inferior lat leads, more prominent that prior  CE ,8 , 25 + delta IN ed patient was able to be weaned of O2 s/p dounebs/prednisone Patient now with sat of 97% on ra Plans made for discharge however repeat CE noted upward trend, with noted ekg changes Review of Systems: As per HPI otherwise 10 point review of systems negative.   Past Medical History:  Diagnosis Date  . COPD (chronic obstructive pulmonary disease) ([place])   . Emphysema of lung ([place])   . Glaucoma   . Hypertension   . Lung cancer [facility])     Past Surgical History:  Procedure Laterality Date  . LOWER EXTREMITY ANGIOGRAPHY Left 07/11/2019   Procedure: LOWER EXTREMITY ANGIOGRAPHY;  Surgeon: Algernon Huxley, MD;  Location: [place] [facility];  Service: Cardiovascular;  Laterality: Left;  . LUNG BIOPSY    . PERIPHERAL  VASCULAR CATHETERIZATION N/A 09/24/2015   Procedure: [name];  Surgeon: Algernon Huxley, MD;  Location: [place] [facility];  Service: Cardiovascular;  Laterality: N/A;  . Port a cath placement       reports that he has been smoking cigarettes. He has a 25.00 pack-year smoking history. He has never used smokeless tobacco. He reports current alcohol use. He reports that he does not use drugs.  No Known Allergies  Family History  Problem Relation Age of Onset  . Diabetes Mellitus II Mother   . Hypertension Father     Prior to Admission medications   Medication Sig Start Date End Date Taking? Authorizing Provider  albuterol (VENTOLIN HFA) 108 (90 Base) MCG/ACT inhaler Inhale 2-4 puffs by mouth every 4 hours as needed for wheezing, cough, and/or shortness of breath 07/08/20   Hinda Kehr, MD  amLODipine (NORVASC) 5 MG tablet Take 0.5 tablets (2.5 mg total) by mouth daily. 11/10/19 12/10/19  Jennye Boroughs, MD  aspirin EC 81 MG tablet Take 1 tablet (81 mg total) by mouth daily. Patient not taking: Reported on 06/21/2020 07/11/19   Algernon Huxley, MD  atorvastatin (LIPITOR) 10 MG tablet Take 1 tablet (10 mg total) by mouth daily. Patient not taking: Reported on 06/21/2020 07/11/19 07/10/20  Algernon Huxley, MD  Cholecalciferol (VITAMIN D-3) 125 MCG (5000 UT) TABS Take 5,000 Units by mouth daily. Patient not taking: Reported on 06/21/2020    [provider]  clopidogrel (PLAVIX) 75 MG tablet  Take 1 tablet (75 mg total) by mouth daily. Patient not taking: Reported on 06/21/2020 07/11/19   Algernon Huxley, MD  COMBIGAN 0.2-0.5 % ophthalmic solution Place 1 drop into both eyes 2 (two) times daily.  08/15/19   [provider]  nicotine (NICODERM CQ - DOSED IN MG/24 HOURS) 21 mg/24hr patch Place 1 patch (21 mg total) onto the skin daily. Patient not taking: Reported on 06/21/2020 09/13/19   Sidney Ace, MD  Omega-3 Fatty Acids (OMEGA-3 FISH OIL PO) Take 1 capsule by mouth  daily. Patient not taking: Reported on 06/21/2020    [provider]  predniSONE (DELTASONE) 20 MG tablet Take 3 tablets (60 mg total) by mouth daily. 07/08/20   Hinda Kehr, MD  TRAVATAN Z 0.004 % SOLN ophthalmic solution Place 1 drop into both eyes at bedtime.  04/01/18   [provider]    Physical Exam: Vitals:   08/03/20 0815 08/03/20 0816 08/03/20 1107 08/03/20 1213  BP: (!) 168/108  (!) 163/116 (!) 147/87  Pulse: (!) 113   (!) 105  Resp: 19  18 20   Temp: 99.2 F (37.3 C)     TempSrc: Oral     SpO2: 94%  98% 97%  Weight:  68.8 kg    Height:  6\' 1"  (1.854 m)       Vitals:   08/03/20 0815 08/03/20 0816 08/03/20 1107 08/03/20 1213  BP: (!) 168/108  (!) 163/116 (!) 147/87  Pulse: (!) 113   (!) 105  Resp: 19  18 20   Temp: 99.2 F (37.3 C)     TempSrc: Oral     SpO2: 94%  98% 97%  Weight:  68.8 kg    Height:  6\' 1"  (1.854 m)    Constitutional: NAD, calm, comfortable Eyes: PERRL, lids and conjunctivae normal ENMT: Mucous membranes are moist. Posterior pharynx clear of any exudate or lesions.Normal dentition.  Neck: normal, supple, no masses, no thyromegaly Respiratory: clear to auscultation bilaterally, mild coarse bs, no wheezing, no crackles. Normal respiratory effort. No accessory muscle use.  Cardiovascular: Regular rate and rhythm, no murmurs / rubs / gallops. No extremity edema. + pedal pulses.  Abdomen: no tenderness, no masses palpated. No hepatosplenomegaly. Bowel sounds positive.  Musculoskeletal: no clubbing / cyanosis. No joint deformity upper and lower extremities. Good ROM, no contractures. Normal muscle tone.  Skin: no rashes, lesions, ulcers. No induration Neurologic: CN 2-12 grossly intact. Sensation intact,  Strength 5/5 in all 4.  Psychiatric: Normal judgment and insight. Alert and oriented x 3. Normal mood.    Labs on Admission: I have personally reviewed following labs and imaging studies  CBC: Recent Labs  Lab 08/03/20 0815   WBC 6.1  NEUTROABS 4.9  HGB 10.1*  HCT 32.5*  MCV 72.2*  PLT 631   Basic Metabolic Panel: Recent Labs  Lab 08/03/20 0815  NA 136  K 4.1  CL 99  CO2 24  GLUCOSE 118*  BUN 13  CREATININE 0.84  CALCIUM 9.3   GFR: Estimated Creatinine Clearance: 78.5 mL/min (by C-G formula based on SCr of 0.84 mg/dL). Liver Function Tests: Recent Labs  Lab 08/03/20 0815  AST 23  ALT 14  ALKPHOS 58  BILITOT 1.8*  PROT 7.3  ALBUMIN 3.8   No results for input(s): LIPASE, AMYLASE in the last 168 hours. No results for input(s): AMMONIA in the last 168 hours. Coagulation Profile: No results for input(s): INR, PROTIME in the last 168 hours. Cardiac Enzymes: No results for  input(s): CKTOTAL, CKMB, CKMBINDEX, TROPONINI in the last 168 hours. BNP (last 3 results) No results for input(s): PROBNP in the last 8760 hours. HbA1C: No results for input(s): HGBA1C in the last 72 hours. CBG: No results for input(s): GLUCAP in the last 168 hours. Lipid Profile: No results for input(s): CHOL, HDL, LDLCALC, TRIG, CHOLHDL, LDLDIRECT in the last 72 hours. Thyroid Function Tests: No results for input(s): TSH, T4TOTAL, FREET4, T3FREE, THYROIDAB in the last 72 hours. Anemia Panel: No results for input(s): VITAMINB12, FOLATE, FERRITIN, TIBC, IRON, RETICCTPCT in the last 72 hours. Urine analysis:    Component Value Date/Time   COLORURINE YELLOW (A) 12/24/2017 0914   APPEARANCEUR HAZY (A) 12/24/2017 0914   LABSPEC 1.019 12/24/2017 0914   PHURINE 5.0 12/24/2017 0914   GLUCOSEU NEGATIVE 12/24/2017 0914   HGBUR NEGATIVE 12/24/2017 0914   BILIRUBINUR NEGATIVE 12/24/2017 0914   KETONESUR 5 (A) 12/24/2017 0914   PROTEINUR NEGATIVE 12/24/2017 0914   NITRITE NEGATIVE 12/24/2017 0914   LEUKOCYTESUR SMALL (A) 12/24/2017 0914    Radiological Exams on Admission: DG Chest 2 View  Result Date: 08/03/2020 CLINICAL DATA:  Shortness of breath and cough EXAM: CHEST - 2 VIEW COMPARISON:  07/08/2020 FINDINGS:  Normal heart size and mediastinal contours. Small band of scarring at the lingula. Large lung volumes in the setting of emphysema. Blunting at the posterior costophrenic sulci is likely from the hyperinflation. Porta catheter on the right with tip in good position. There is no edema, consolidation, effusion, or pneumothorax. No acute osseous finding. IMPRESSION: Emphysema without acute superimposed finding. Electronically Signed   By: Monte Fantasia M.D.   On: 08/03/2020 09:03    EKG: Independently reviewed.   Assessment/Plan  AECOPD -due to viral infection  -place on inhaler standing and prn  -mucolytic prn,pulmonary toilet -zpack x 5days -baseline abg ordered  -prednisone 40 mg po dailyx 5 days -wean O2 as able    Abn CE/EKG  r/o ACS --ekg with more prominent s-Twave changes  -ce 8,-->25  -concern for developing ACS -will start full dose lovenox ,asa,statin -cycle ce, check lipid, echo  -control blood pressure, reverse hypoxemia as able -cardiology consult   COVID viral infection without  associated hypoxic respiratory failure  - remdesivir per pharmacy protocol x 3 days -patient currently without O2 requirement  -encourage po fluid intake/supportive care  -daily monitoring of COVIDseverity labs  -consider CTPA if patient has progression of clinical course  -dvt ppx per protocol   HTN -uncontrolled bp in ed with diastolic in 1teens which resolved w/o treatment -resume home regimen -prn as needed   PVD  -followed by vascular (Dr Lucky Cowboy) -S/p left lower extremity angiogram and revascularization  -continue plavix   Tobacco abuse -encourage cessation  -further education per RN protocol  Lung CA  -Status post chemotherapy and radiotherapy in 2017 - no recurrence.  HLD -continue statin   Glaucoma  -no acute issues     DVT prophylaxis: lovenox  Code Status: Full Family Communication: no family at beside  Disposition Plan: patient  expected to be admitted  greater than 2 midnights Consults called:  Cardiology:[place] /Dun Admission status: inpatient , med surg   Clance Boll MD [facility]  If 7PM-7AM, please contact night-coverage [email or web address] Password [email or web address]  08/03/2020, 1:40 PM

## 2020-08-03 NOTE — Consult Note (Signed)
ANTICOAGULATION CONSULT NOTE - Consult  Pharmacy Consult for enoxaparin  Indication: ACS/CP/NSTEMI No Known Allergies  Patient Measurements: Height: 6\' 1"  (185.4 cm) Weight: 68.8 kg (151 lb 10.8 oz) IBW/kg (Calculated) : 79.9 Heparin Dosing Weight: 68.8 kg  Vital Signs: Temp: 99.2 F (37.3 C) (01/21 0815) Temp Source: Oral (01/21 0815) BP: 147/87 (01/21 1213) Pulse Rate: 105 (01/21 1213)  Labs: Recent Labs    08/03/20 0815 08/03/20 1213  HGB 10.1*  --   HCT 32.5*  --   PLT 182  --   CREATININE 0.84  --   TROPONINIHS 8 25*    Estimated Creatinine Clearance: 78.5 mL/min (by C-G formula based on SCr of 0.84 mg/dL).   Medications:  Heparin Dosing Weight: 68.8 kg PTA: ASA 81, Plavix 75mg  IP: ASA 81mg   Assessment: 70yo M PMH of COPD, HTN, h/o lung cancer, emphysema presenting with SOB and non-productive cough x1d.  Recent covid exposure, but is fully vaccinated and boosted. Pharmacy consulted for the management of therapeutic lovenox ISO CP and c/f ACS/NSTEMI.  Goal of Therapy:  Monitor platelets by anticoagulation protocol: Yes   Plan:  Lovenox 1mg /kg BID (70mg )  Carl Bell 08/03/2020,2:59 PM

## 2020-08-03 NOTE — ED Triage Notes (Signed)
Presents via EMS with some SOB  No relief with use of inhalers   o2 on RA 93%  Placed on 3 l via [place]  100% at present.developed new cough with some fever

## 2020-08-03 NOTE — ED Notes (Signed)
Pt awake and alert; GCS 15 - now sitting up in bed watching tv - no acute distress.  Pt states original complaints of sob and cough have improved since arrival - RR even and unlabored on RA with pt speaking in clear complete sentences; O2 sats 99%.  Cardiac monitoring initiated; NSR HR 80s.  Abdomen soft nontender-- skin warm dry and intact.  20G R FA; dressing dry and intact- site free of complications.  Pt requesting to eat but denies any other requests, questions, concerns -- will provide sandwich meal and continue to monitor for acute changes and maintain plan of care as pt awaits bed assignment.

## 2020-08-03 NOTE — ED Provider Notes (Signed)
[facility] Emergency Department Provider Note ____________________________________________   Event Date/Time   First MD Initiated Contact with Patient 08/03/20 [phone]     (approximate)  I have reviewed the triage vital signs and the nursing notes.   HISTORY  Chief Complaint Shortness of Breath    HPI Carl Bell is a 70 y.o. male with PMH as noted below including COPD (not on home O2) who presents with worsening shortness of breath since yesterday associated with nonproductive cough.  The patient denies any significant fevers, chills, vomiting, or diarrhea.  He states that his sister was recently around him and tested positive for COVID.  The patient is vaccinated and states he got a booster several months ago.  Past Medical History:  Diagnosis Date  . COPD (chronic obstructive pulmonary disease) ([place])   . Emphysema of lung ([place])   . Glaucoma   . Hypertension   . Lung cancer [facility])     Patient Active Problem List   Diagnosis Date Noted  . ACS (acute coronary syndrome) ([place]) 08/03/2020  . COPD exacerbation ([place]) 11/08/2019  . Acute exacerbation of chronic obstructive pulmonary disease (COPD) ([place]) 10/18/2019  . Hypertensive urgency 10/17/2019  . COPD with acute exacerbation ([place]) 09/12/2019  . Acute on chronic respiratory failure with hypoxia and hypercapnia ([place]) 09/12/2019  . Tobacco abuse 09/12/2019  . PAD (peripheral artery disease) ([place]) 09/12/2019  . HLD (hyperlipidemia) 09/12/2019  . HTN (hypertension) 09/12/2019  . Atherosclerosis of native arteries of the extremities with ulceration ([place]) 06/28/2019  . Sepsis ([place]) 12/24/2017  . Protein-calorie malnutrition, severe 11/12/2017  . Acute respiratory failure ([place]) 11/10/2017  . Acute respiratory distress 12/22/2015  . COPD (chronic obstructive pulmonary disease) ([place]) 12/22/2015  . Acute bronchitis 12/22/2015  . HTN, goal below 140/80 12/22/2015  . Lung cancer, lingula ([place]) 09/23/2015   . Chronic edema 09/15/2014    Past Surgical History:  Procedure Laterality Date  . LOWER EXTREMITY ANGIOGRAPHY Left 07/11/2019   Procedure: LOWER EXTREMITY ANGIOGRAPHY;  Surgeon: Algernon Huxley, MD;  Location: [facility] [facility];  Service: Cardiovascular;  Laterality: Left;  . LUNG BIOPSY    . PERIPHERAL VASCULAR CATHETERIZATION N/A 09/24/2015   Procedure: [name];  Surgeon: Algernon Huxley, MD;  Location: [facility];  Service: Cardiovascular;  Laterality: N/A;  . Port a cath placement      Prior to Admission medications   Medication Sig Start Date End Date Taking? Authorizing Provider  albuterol (VENTOLIN HFA) 108 (90 Base) MCG/ACT inhaler Inhale 2-4 puffs by mouth every 4 hours as needed for wheezing, cough, and/or shortness of breath 07/08/20   Hinda Kehr, MD  amLODipine (NORVASC) 5 MG tablet Take 0.5 tablets (2.5 mg total) by mouth daily. 11/10/19 12/10/19  Jennye Boroughs, MD  aspirin EC 81 MG tablet Take 1 tablet (81 mg total) by mouth daily. Patient not taking: Reported on 06/21/2020 07/11/19   Algernon Huxley, MD  atorvastatin (LIPITOR) 10 MG tablet Take 1 tablet (10 mg total) by mouth daily. Patient not taking: Reported on 06/21/2020 07/11/19 07/10/20  Algernon Huxley, MD  Cholecalciferol (VITAMIN D-3) 125 MCG (5000 UT) TABS Take 5,000 Units by mouth daily. Patient not taking: Reported on 06/21/2020    [provider]  clopidogrel (PLAVIX) 75 MG tablet Take 1 tablet (75 mg total) by mouth daily. Patient not taking: Reported on 06/21/2020 07/11/19   Algernon Huxley, MD  COMBIGAN 0.2-0.5 % ophthalmic solution Place 1 drop into both  eyes 2 (two) times daily.  08/15/19   [provider]  nicotine (NICODERM CQ - DOSED IN MG/24 HOURS) 21 mg/24hr patch Place 1 patch (21 mg total) onto the skin daily. Patient not taking: Reported on 06/21/2020 09/13/19   Sidney Ace, MD  Omega-3 Fatty Acids (OMEGA-3 FISH OIL PO) Take 1 capsule by mouth daily. Patient not  taking: Reported on 06/21/2020    [provider]  predniSONE (DELTASONE) 20 MG tablet Take 3 tablets (60 mg total) by mouth daily. 07/08/20   Hinda Kehr, MD  TRAVATAN Z 0.004 % SOLN ophthalmic solution Place 1 drop into both eyes at bedtime.  04/01/18   [provider]    Allergies Patient has no known allergies.  Family History  Problem Relation Age of Onset  . Diabetes Mellitus II Mother   . Hypertension Father     Social History Social History   Tobacco Use  . Smoking status: Current Every Day Smoker    Packs/day: 0.50    Years: 50.00    Pack years: 25.00    Types: Cigarettes  . Smokeless tobacco: Never Used  Vaping Use  . Vaping Use: Never used  Substance Use Topics  . Alcohol use: Yes    Alcohol/week: 0.0 standard drinks    Comment: 3 pints/week  . Drug use: No    Review of Systems  Constitutional: No fever/chills. Eyes: No redness. ENT: No sore throat. Cardiovascular: Denies chest pain. Respiratory: Positive for shortness of breath. Gastrointestinal: No vomiting or diarrhea.  Genitourinary: Negative for dysuria.  Musculoskeletal: Negative for back pain. Skin: Negative for rash. Neurological: Negative for headache.   ____________________________________________   PHYSICAL EXAM:  VITAL SIGNS: ED Triage Vitals  Enc Vitals Group     BP 08/03/20 0815 (!) 168/108     Pulse Rate 08/03/20 0815 (!) 113     Resp 08/03/20 0815 19     Temp 08/03/20 0815 99.2 F (37.3 C)     Temp Source 08/03/20 0815 Oral     SpO2 08/03/20 0815 94 %     Weight 08/03/20 0816 151 lb 10.8 oz (68.8 kg)     Height 08/03/20 0816 6\' 1"  (1.854 m)     Head Circumference --      Peak Flow --      Pain Score --      Pain Loc --      Pain Edu? --      Excl. in [place]? --     Constitutional: Alert and oriented.  Relatively well appearing, in no acute distress. Eyes: Conjunctivae are normal.  Head: Atraumatic. Nose: No congestion/rhinnorhea. Mouth/Throat: Mucous  membranes are moist.   Neck: Normal range of motion.  Cardiovascular: Normal rate, regular rhythm. Grossly normal heart sounds.  Good peripheral circulation. Respiratory: Slightly increased respiratory effort.  No retractions.  Diminished breath sounds bilaterally. Gastrointestinal: Soft and nontender. No distention.  Genitourinary: No flank tenderness. Musculoskeletal: No lower extremity edema.  Extremities warm and well perfused.  Neurologic:  Normal speech and language. No gross focal neurologic deficits are appreciated.  Skin:  Skin is warm and dry. No rash noted. Psychiatric: Mood and affect are normal. Speech and behavior are normal.  ____________________________________________   LABS (all labs ordered are listed, but only abnormal results are displayed)  Labs Reviewed  CBC WITH DIFFERENTIAL/PLATELET - Abnormal; Notable for the following components:      Result Value   Hemoglobin 10.1 (*)    HCT 32.5 (*)  MCV 72.2 (*)    MCH 22.4 (*)    RDW 21.1 (*)    Lymphs Abs 0.3 (*)    All other components within normal limits  COMPREHENSIVE METABOLIC PANEL - Abnormal; Notable for the following components:   Glucose, Bld 118 (*)    Total Bilirubin 1.8 (*)    All other components within normal limits  POC SARS CORONAVIRUS 2 AG -  ED - Abnormal; Notable for the following components:   SARS Coronavirus 2 Ag POSITIVE (*)    All other components within normal limits  TROPONIN I (HIGH SENSITIVITY) - Abnormal; Notable for the following components:   Troponin I (High Sensitivity) 25 (*)    All other components within normal limits  TSH  HEMOGLOBIN A1C  BRAIN NATRIURETIC PEPTIDE  PROTIME-INR  C-REACTIVE PROTEIN  FERRITIN  FIBRIN DERIVATIVES D-DIMER ([facility] ONLY)  IRON AND TIBC  BLOOD GAS, ARTERIAL  TROPONIN I (HIGH SENSITIVITY)   ____________________________________________  EKG  ED ECG REPORT I, Arta Silence, the attending physician, personally viewed and interpreted this  ECG.  Date: 08/03/2020 EKG Time: 08 18 Rate: 113 Rhythm: Sinus tachycardia QRS Axis: normal Intervals: normal ST/T Wave abnormalities: Nonspecific ST abnormalities Narrative Interpretation: Nonspecific abnormalities with possibly more prominent inferior ST abnormality when compared to EKG of 07/08/2020  ____________________________________________  RADIOLOGY  Chest x-ray interpreted by me shows no focal infiltrate or edema  ____________________________________________   PROCEDURES  Procedure(s) performed: No  Procedures  Critical Care performed: No ____________________________________________   INITIAL IMPRESSION / ASSESSMENT AND PLAN / ED COURSE  Pertinent labs & imaging results that were available during my care of the patient were reviewed by me and considered in my medical decision making (see chart for details).  70 year old male with history of COPD presents with increased shortness of breath since yesterday along with a nonproductive cough.  He did not note a fever at home.  I reviewed the past medical records in [place].  He was seen for COPD exacerbations not requiring admission on 11/27 and 12/26.  On exam currently, the patient is overall relatively well-appearing.  He has slightly increased work of breathing but no acute respiratory distress.  He has a borderline elevated temperature and mild tachycardia.  O2 saturation was 93% on room air.  Differential includes COPD exacerbation, acute bronchitis, pneumonia, COVID-19.  I have a low suspicion for cardiac etiology.  The EKG shows no acute ischemic changes and the patient denies any chest pain.  We will obtain a chest x-ray, lab work-up, COVID swab and reassess.  ----------------------------------------- 1:55 PM on 08/03/2020 -----------------------------------------  The patient states that he feels significantly better although he still does have slightly increased work of breathing and some intermittent  pursed lip type breathing.  Initial troponin was negative, however the repeat has increased.  Given the somewhat abnormal EKG, I recommended that we admit the patient for ACS rule out and he agrees.  Aspirin has been ordered.  I discussed the case with the hospitalist Dr. Marcello Moores for admission.  ____________________________________________   FINAL CLINICAL IMPRESSION(S) / ED DIAGNOSES  Final diagnoses:  COPD exacerbation ([place])  COVID-19  Elevated troponin      NEW MEDICATIONS STARTED DURING THIS VISIT:  New Prescriptions   No medications on file     Note:  This document was prepared using Dragon voice recognition software and may include unintentional dictation errors.    Arta Silence, MD 08/03/20 1452

## 2020-08-03 NOTE — ED Notes (Signed)
Pt 99% on RA

## 2020-08-04 ENCOUNTER — Encounter: Payer: Self-pay | Admitting: Internal Medicine

## 2020-08-04 ENCOUNTER — Inpatient Hospital Stay: Admit: 2020-08-04 | Payer: Medicare (Managed Care)

## 2020-08-04 DIAGNOSIS — E785 Hyperlipidemia, unspecified: Secondary | ICD-10-CM

## 2020-08-04 DIAGNOSIS — R0602 Shortness of breath: Secondary | ICD-10-CM | POA: Diagnosis not present

## 2020-08-04 DIAGNOSIS — I10 Essential (primary) hypertension: Secondary | ICD-10-CM

## 2020-08-04 DIAGNOSIS — R778 Other specified abnormalities of plasma proteins: Secondary | ICD-10-CM | POA: Diagnosis not present

## 2020-08-04 DIAGNOSIS — I248 Other forms of acute ischemic heart disease: Secondary | ICD-10-CM

## 2020-08-04 DIAGNOSIS — J9601 Acute respiratory failure with hypoxia: Secondary | ICD-10-CM

## 2020-08-04 DIAGNOSIS — U071 COVID-19: Secondary | ICD-10-CM | POA: Diagnosis not present

## 2020-08-04 LAB — LIPID PANEL
Cholesterol: 145 mg/dL (ref 0–200)
HDL: 73 mg/dL (ref >40)
LDL Cholesterol: 62 mg/dL (ref 0–99)
Total CHOL/HDL Ratio: 2 RATIO
Triglycerides: 52 mg/dL (ref <150)
VLDL: 10 mg/dL (ref 0–40)

## 2020-08-04 LAB — C-REACTIVE PROTEIN: CRP: 4.2 mg/dL — ABNORMAL HIGH (ref <1.0)

## 2020-08-04 LAB — CBG MONITORING, ED: Glucose-Capillary: 135 mg/dL — ABNORMAL HIGH (ref 70–99)

## 2020-08-04 LAB — GLUCOSE, CAPILLARY: Glucose-Capillary: 121 mg/dL — ABNORMAL HIGH (ref 70–99)

## 2020-08-04 MED ORDER — ALBUTEROL SULFATE HFA 108 (90 BASE) MCG/ACT IN AERS
1.0000 | INHALATION_SPRAY | RESPIRATORY_TRACT | Status: DC | PRN
  Filled 2020-08-04: qty 6.7

## 2020-08-04 MED ORDER — GUAIFENESIN-DM 100-10 MG/5ML PO SYRP
5.0000 mL | ORAL_SOLUTION | ORAL | Status: DC | PRN
  Administered 2020-08-04 – 2020-08-07 (×5): 5 mL via ORAL
  Filled 2020-08-04 (×5): qty 5

## 2020-08-04 MED ORDER — CHLORHEXIDINE GLUCONATE CLOTH 2 % EX PADS: 6.0000 | MEDICATED_PAD | Freq: Every day | CUTANEOUS | Status: DC

## 2020-08-04 MED ORDER — ATORVASTATIN CALCIUM 20 MG PO TABS
10.0000 mg | ORAL_TABLET | Freq: Every day | ORAL | Status: DC
  Administered 2020-08-04 – 2020-08-08 (×5): 10 mg via ORAL
  Filled 2020-08-04 (×5): qty 1

## 2020-08-04 MED ORDER — LATANOPROST 0.005 % OP SOLN
1.0000 [drp] | Freq: Every day | OPHTHALMIC | Status: DC
  Administered 2020-08-04 – 2020-08-07 (×4): 1 [drp] via OPHTHALMIC
  Filled 2020-08-04: qty 2.5

## 2020-08-04 MED ORDER — DEXTROSE 50 % IV SOLN
INTRAVENOUS | Status: AC
  Filled 2020-08-04: qty 50

## 2020-08-04 MED ORDER — SODIUM CHLORIDE 0.9 % IV SOLN
100.0000 mg | Freq: Every day | INTRAVENOUS | Status: AC
  Administered 2020-08-05 – 2020-08-08 (×4): 100 mg via INTRAVENOUS
  Filled 2020-08-04 (×4): qty 20

## 2020-08-04 MED ORDER — METHYLPREDNISOLONE SODIUM SUCC 125 MG IJ SOLR
60.0000 mg | Freq: Two times a day (BID) | INTRAMUSCULAR | Status: DC
  Administered 2020-08-04 – 2020-08-08 (×9): 60 mg via INTRAVENOUS
  Filled 2020-08-04 (×9): qty 2

## 2020-08-04 MED ORDER — SODIUM CHLORIDE 0.9 % IV SOLN
200.0000 mg | Freq: Once | INTRAVENOUS | Status: AC
  Administered 2020-08-04: 21:00:00 200 mg via INTRAVENOUS
  Filled 2020-08-04: qty 200

## 2020-08-04 NOTE — ED Notes (Signed)
Pt awake and alert but appears forgetful at times -- has repeatedly stated to nurse to take phone out of room when hospital loaner phone used earlier was already removed - pt also states he hadn't eaten since being here when this nurse provided express meal sandwich.  Pt states he is legally blind (states he has cataract and glaucoma) -- R chest port noted which has not been accessed - pt does report h/o lung ca in remission.  Denies any needs, questions concerns at this time.  Will monitor for acute changes and maintain plan of care

## 2020-08-04 NOTE — ED Notes (Signed)
Pt provided lunch tray.

## 2020-08-04 NOTE — Progress Notes (Signed)
PROGRESS NOTE    Carl Bell  FWY:[id number] DOB: [birth year] DOA: 08/03/2020 PCP: Kirk Ruths, MD    Brief Narrative:  Carl Bell is a 70 y.o. male with medical history significant of  COPD not on home O2, HTN, Anemia,Lung CA Status post chemotherapy and radiotherapy in 2017 with no recurrence,continued tobacco abuse  Patient presents to ED BIBEMS with complaint of sob/severe cough/ fever x 2 days. In the field per EMS saturation 93% on ra , patient placed on 3L with improvement to 100%.   Patient found to be COVID-positive EKG with ST depressions  Consultants:   Cardiology  Procedures:   Antimicrobials:       Subjective: Less sob. No cp. No dizziness, no chills  Objective: Vitals:   08/04/20 1400 08/04/20 1430 08/04/20 1500 08/04/20 1530  BP: 131/78 115/79 135/84 133/87  Pulse: 67 72 74   Resp: 17 16    Temp:      TempSrc:      SpO2: 98% 97% 95%   Weight:      Height:       No intake or output data in the 24 hours ending 08/04/20 1659 Filed Weights   08/03/20 0816  Weight: 68.8 kg    Examination:  General exam: Appears calm and comfortable  Respiratory system: Increased expiratory time, no wheezing Cardiovascular system: S1 & S2 heard, RRR. No JVD, murmurs, rubs, gallops or clicks.  Gastrointestinal system: Abdomen is nondistended, soft and nontender.. Normal bowel sounds heard. Central nervous system: Alert and oriented.  Grossly intact Extremities: No edema Skin: Warm dry Psychiatry: Mood & affect appropriate.     Data Reviewed: I have personally reviewed following labs and imaging studies  CBC: Recent Labs  Lab 08/03/20 0815  WBC 6.1  NEUTROABS 4.9  HGB 10.1*  HCT 32.5*  MCV 72.2*  PLT 277   Basic Metabolic Panel: Recent Labs  Lab 08/03/20 0815  NA 136  K 4.1  CL 99  CO2 24  GLUCOSE 118*  BUN 13  CREATININE 0.84  CALCIUM 9.3   GFR: Estimated Creatinine Clearance: 78.5 mL/min (by C-G formula based on SCr of 0.84  mg/dL). Liver Function Tests: Recent Labs  Lab 08/03/20 0815  AST 23  ALT 14  ALKPHOS 58  BILITOT 1.8*  PROT 7.3  ALBUMIN 3.8   No results for input(s): LIPASE, AMYLASE in the last 168 hours. No results for input(s): AMMONIA in the last 168 hours. Coagulation Profile: Recent Labs  Lab 08/03/20 1613  INR 1.2   Cardiac Enzymes: No results for input(s): CKTOTAL, CKMB, CKMBINDEX, TROPONINI in the last 168 hours. BNP (last 3 results) No results for input(s): PROBNP in the last 8760 hours. HbA1C: Recent Labs    08/03/20 1613  HGBA1C 5.3   CBG: Recent Labs  Lab 08/03/20 1949 08/04/20 1040  GLUCAP 123* 135*   Lipid Profile: Recent Labs    08/04/20 0500  CHOL 145  HDL 73  LDLCALC 62  TRIG 52  CHOLHDL 2.0   Thyroid Function Tests: Recent Labs    08/03/20 1613  TSH 0.292*   Anemia Panel: Recent Labs    08/03/20 1613  FERRITIN 21*  TIBC 451*  IRON 77   Sepsis Labs: No results for input(s): PROCALCITON, LATICACIDVEN in the last 168 hours.  No results found for this or any previous visit (from the past 240 hour(s)).       Radiology Studies: DG Chest 2 View  Result Date: 08/03/2020 CLINICAL DATA:  Shortness of breath and cough EXAM: CHEST - 2 VIEW COMPARISON:  07/08/2020 FINDINGS: Normal heart size and mediastinal contours. Small band of scarring at the lingula. Large lung volumes in the setting of emphysema. Blunting at the posterior costophrenic sulci is likely from the hyperinflation. Porta catheter on the right with tip in good position. There is no edema, consolidation, effusion, or pneumothorax. No acute osseous finding. IMPRESSION: Emphysema without acute superimposed finding. Electronically Signed   By: Monte Fantasia M.D.   On: 08/03/2020 09:03        Scheduled Meds: . aspirin EC  81 mg Oral Daily  . atorvastatin  10 mg Oral Daily  . enoxaparin (LOVENOX) injection  1 mg/kg Subcutaneous Q12H  . methylPREDNISolone (SOLU-MEDROL) injection   60 mg Intravenous Q12H  . metoprolol tartrate  12.5 mg Oral BID   Continuous Infusions:  Assessment & Plan:   Principal Problem:   COVID Active Problems:   Demand ischemia (HCC)   SOB (shortness of breath)   AECOPD -due to viral infection  Start IV steroids Keep 02 >92%  Continue inhalers  Covid + Initially presented at 93% on RA Started on iv steroids crp elevated, fibrinogen elevated Discussed with pt about iv Remdesivir- he is ok with start this Pharmacy to start Rem. airborn contact precautions   Abn CE/EKG -likely to demand ischemia Not consistent with ACS Mildly elevated TP Cards was consulted, input was appreciated Ck echo Continue asa, statin, beta-blockers   HTN-stable Continue  beta-blockers    PVD  -followed by vascular (Dr Lucky Cowboy) -S/p left lower extremity angiogram and revascularization  Not sure pt on plavix, will need to ck Continue on asa.  Tobacco abuse -encourage cessation  -further education per RN protocol  Lung CA  -Status post chemotherapy and radiotherapy in 2017 - no recurrence.  HLD -continue statin   Glaucoma  Resume Xalatan  DVT prophylaxis: Lovenox Code Status: Full Family Communication: None at bedside  Status is: Inpatient  Remains inpatient appropriate because:Inpatient level of care appropriate due to severity of illness   Dispo: The patient is from: Home              Anticipated d/c is to: Home              Anticipated d/c date is: 3 days              Patient currently is not medically stable to d/c.   Difficult to place patient No            LOS: 1 day   Time spent: 35 minutes with more than 50% on [place], MD [facility] Pager [phone]  If 7PM-7AM, please contact night-coverage 08/04/2020, 4:59 PM

## 2020-08-04 NOTE — Consult Note (Signed)
Remdesivir - Pharmacy Brief Note   O:  ALT: 14 CXR: Emphysema without acute superimposed finding. SpO2: 98% on RA   A/P:  Remdesivir 200 mg IVPB once followed by 100 mg IVPB daily x 4 days.   Darnelle Bos, [facility] 08/04/2020 5:20 PM

## 2020-08-04 NOTE — ED Notes (Signed)
While at bedside pt observed to have persistent nonprod cough - no other acute changes noted.  Remains on continuous cardiac and pulse ox monitoring.  VSS.  (on call provider sent secure chat requesting prn cough med).

## 2020-08-04 NOTE — Consult Note (Addendum)
Cardiology Consultation:   Patient ID: Carl Bell; [id number]; May 29, 1951   Admit date: 08/03/2020 Date of Consult: 08/04/2020  Primary Care Provider: Kirk Ruths, MD Primary Cardiologist: New to [facility] (consult by Caroleena Paolini) Primary Electrophysiologist:  None   Patient Profile:   Carl Bell is a 70 y.o. male with a hx of lung cancer status post chemoradiation in 2017 with ongoing tobacco use, COPD not on supplemental oxygen, PVD status post left lower extremity angioplasty and stenting by vascular surgery in 06/2019 on aspirin and Plavix, HTN, HLD who is being seen today for the evaluation of elevated troponin at the request of Dr. Marcello Moores.  History of Present Illness:   Carl Bell has no previously known cardiac history.  Remote echo performed at [facility] in 09/2014, performed for shortness of breath, showed an EF of 55 to 60%, normal RV systolic function, mild tricuspid valve regurgitation, trace mitral valve regurgitation, and no significant valvular stenoses.  He presented to [facility] ED, via EMS, on 08/03/2020 with a 2-day history of fever, severe cough, and shortness of breath.  It was noted to have O2 saturations of 93% on room air in the field and was placed on supplemental oxygen via nasal cannula at 3 L.  He reported recent contact with known COVID positive sister.  He denied any chest pain other than being sore from coughing.  He was found to be COVID-positive upon admission.  High-sensitivity troponin was checked with an initial value of 8 with a delta of 25 subsequently peaking at 26 and now downtrending.  BNP mildly elevated at 653.  D-dimer 1229.  Initial BP elevated in the 160s over 100s, currently improved.  EKG showed sinus tachycardia, 113 bpm, LVH, and diffuse ST-T changes.  Chest x-ray showed emphysema without acute superimposed findings.  Upon admission he was placed on full dose Lovenox, ASA, metoprolol, remdesivir, and methylprednisolone.  Cardiology asked to evaluate  elevated troponin.    Past Medical History:  Diagnosis Date  . COPD (chronic obstructive pulmonary disease) ([place])   . Emphysema of lung ([place])   . Glaucoma   . Hypertension   . Lung cancer [facility])     Past Surgical History:  Procedure Laterality Date  . LOWER EXTREMITY ANGIOGRAPHY Left 07/11/2019   Procedure: LOWER EXTREMITY ANGIOGRAPHY;  Surgeon: Algernon Huxley, MD;  Location: [place] [facility];  Service: Cardiovascular;  Laterality: Left;  . LUNG BIOPSY    . PERIPHERAL VASCULAR CATHETERIZATION N/A 09/24/2015   Procedure: [name];  Surgeon: Algernon Huxley, MD;  Location: [place] [facility];  Service: Cardiovascular;  Laterality: N/A;  . Port a cath placement       Home Meds: Prior to Admission medications   Medication Sig Start Date End Date Taking? Authorizing Provider  albuterol (VENTOLIN HFA) 108 (90 Base) MCG/ACT inhaler Inhale 2-4 puffs by mouth every 4 hours as needed for wheezing, cough, and/or shortness of breath 07/08/20  Yes Hinda Kehr, MD  dorzolamide-timolol (COSOPT) 22.3-6.8 MG/ML ophthalmic solution Place 1 drop into both eyes 2 (two) times daily. 07/10/20  Yes [provider]  latanoprost (XALATAN) 0.005 % ophthalmic solution Place 1 drop into both eyes at bedtime. 07/10/20  Yes [provider]  predniSONE (DELTASONE) 20 MG tablet Take 40 mg by mouth daily. 07/31/20 08/09/20 Yes [provider]    Inpatient Medications: Scheduled Meds: . aspirin EC  81 mg Oral Daily  . enoxaparin (LOVENOX) injection  1 mg/kg Subcutaneous Q12H  .  metoprolol tartrate  12.5 mg Oral BID   Continuous Infusions:  PRN Meds: acetaminophen, guaiFENesin-dextromethorphan, nitroGLYCERIN, ondansetron (ZOFRAN) IV  Allergies:  No Known Allergies  Social History:   Social History   Socioeconomic History  . Marital status: Legally Separated    Spouse name: Not on file  . Number of children: Not on file  . Years of education: Not on file  .  Highest education level: Not on file  Occupational History  . Not on file  Tobacco Use  . Smoking status: Current Every Day Smoker    Packs/day: 0.50    Years: 50.00    Pack years: 25.00    Types: Cigarettes  . Smokeless tobacco: Never Used  Vaping Use  . Vaping Use: Never used  Substance and Sexual Activity  . Alcohol use: Yes    Alcohol/week: 0.0 standard drinks    Comment: 3 pints/week  . Drug use: No  . Sexual activity: Not on file  Other Topics Concern  . Not on file  Social History Narrative  . Not on file   Social Determinants of Health   Financial Resource Strain: Not on file  Food Insecurity: Not on file  Transportation Needs: Not on file  Physical Activity: Not on file  Stress: Not on file  Social Connections: Not on file  Intimate Partner Violence: Not on file     Family History:   Family History  Problem Relation Age of Onset  . Diabetes Mellitus II Mother   . Hypertension Father     ROS:  ROS   Please see MD attestation secondary to COVID status.  Physical Exam/Data:   Vitals:   08/03/20 1700 08/03/20 1949 08/04/20 0108 08/04/20 0610  BP: 128/87 (!) 130/100 118/72 (!) 123/97  Pulse: 82 77 85 85  Resp: 18 20 20 20   Temp:  97.9 F (36.6 C) 98 F (36.7 C)   TempSrc:  Oral Oral   SpO2: 100% 99% 99% 98%  Weight:      Height:       No intake or output data in the 24 hours ending 08/04/20 0859 Filed Weights   08/03/20 0816  Weight: 68.8 kg   Body mass index is 20.01 kg/m.   Physical Exam: Please see MD attestation secondary to patient COVID status.   EKG:  The EKG was personally reviewed and demonstrates: Sinus tachycardia, 113 bpm, LVH, diffuse ST-T Telemetry:  Telemetry was personally reviewed and demonstrates: SR  Weights: [employer]   08/03/20 0816  Weight: 68.8 kg    Relevant CV Studies:  2D echo 09/22/2014 ([facility]): INTERPRETATION  NORMAL LEFT VENTRICULAR SYSTOLIC FUNCTION = 55-73 %  NORMAL RIGHT VENTRICULAR SYSTOLIC  FUNCTION  MILD TRICUSPID VALVE INSUFFICIENCY  TRACE MITRAL VALVE INSUFFICIENCY  NO VALVULAR STENOSIS    Laboratory Data:  Chemistry Recent Labs  Lab 08/03/20 0815  NA 136  K 4.1  CL 99  CO2 24  GLUCOSE 118*  BUN 13  CREATININE 0.84  CALCIUM 9.3  GFRNONAA >60  ANIONGAP 13    Recent Labs  Lab 08/03/20 0815  PROT 7.3  ALBUMIN 3.8  AST 23  ALT 14  ALKPHOS 58  BILITOT 1.8*   Hematology Recent Labs  Lab 08/03/20 0815  WBC 6.1  RBC 4.50  HGB 10.1*  HCT 32.5*  MCV 72.2*  MCH 22.4*  MCHC 31.1  RDW 21.1*  PLT 182   Cardiac EnzymesNo results for input(s): TROPONINI in the last 168 hours. No results for input(s): TROPIPOC  in the last 168 hours.  BNP Recent Labs  Lab 08/03/20 1613  BNP 653.2*    DDimer No results for input(s): DDIMER in the last 168 hours.  Radiology/Studies:  DG Chest 2 View  Result Date: 08/03/2020 IMPRESSION: Emphysema without acute superimposed finding. Electronically Signed   By: Monte Fantasia M.D.   On: 08/03/2020 09:03    Assessment and Plan:   1.  Demand ischemia/abnormal EKG: -High-sensitivity troponin minimally elevated and flat trending peaking at 26 and subsequently down trending -Felt to be in the setting of supply demand ischemia with acute hypoxic respiratory failure secondary to underlying COVID infection and underlying hypertension -Not consistent with ACS -No indication for heparin drip from a troponin perspective at this time -Patient certainly has significant risk factors for CAD including ongoing tobacco use with underlying PVD and documented aortic atherosclerosis, male status with age 26, hypertension, and hyperlipidemia -Recommend obtaining echo, if this is unrevealing he can follow-up as an outpatient once he is improved from his acute illness for further ischemic evaluation -ASA -LDL 62, continue PTA atorvastatin 10 mg -A1c 5.3  2. HTN: -BP improved -Continue metoprolol  3. HLD: -LDL at goal as outlined  above -PTA atorvastatin  4. PVD: -Consider resuming PTA aspirin/Plavix, if indicated per IM  5. Acute hypoxic respiratory failure secondary to COVID infection/elevated D-dimer: -Management per internal medicine    For questions or updates, please contact [facility] Please consult [email or web address] for contact info under Cardiology/STEMI.   Signed, Christell Faith, PA-C [facility] Pager: [phone] 08/04/2020, 8:59 AM   I have reviewed the above assessment and plan and discussed with Christell Faith.  Changes to above are made where necessary.  The patient presents with COVID.   I agree that current admission is not consistent with ACS.  Outpatient myoview would be reasonable once he is recovered from [place].  Echo is pending.  If Echo is low risk, cardiology team to see as needed  Please call with questions.  Co Sign: Thompson Grayer, MD 08/04/2020 3:40 PM

## 2020-08-05 DIAGNOSIS — R9431 Abnormal electrocardiogram [ECG] [EKG]: Secondary | ICD-10-CM

## 2020-08-05 DIAGNOSIS — R778 Other specified abnormalities of plasma proteins: Secondary | ICD-10-CM | POA: Diagnosis not present

## 2020-08-05 DIAGNOSIS — U071 COVID-19: Secondary | ICD-10-CM | POA: Diagnosis not present

## 2020-08-05 LAB — CBC
HCT: 32 % — ABNORMAL LOW (ref 39.0–52.0)
Hemoglobin: 10.5 g/dL — ABNORMAL LOW (ref 13.0–17.0)
MCH: 23.3 pg — ABNORMAL LOW (ref 26.0–34.0)
MCHC: 32.8 g/dL (ref 30.0–36.0)
MCV: 71.1 fL — ABNORMAL LOW (ref 80.0–100.0)
Platelets: 200 10*3/uL (ref 150–400)
RBC: 4.5 MIL/uL (ref 4.22–5.81)
RDW: 20.9 % — ABNORMAL HIGH (ref 11.5–15.5)
WBC: 3.2 10*3/uL — ABNORMAL LOW (ref 4.0–10.5)
nRBC: 0 % (ref 0.0–0.2)

## 2020-08-05 LAB — BASIC METABOLIC PANEL
Anion gap: 13 (ref 5–15)
BUN: 23 mg/dL (ref 8–23)
CO2: 26 mmol/L (ref 22–32)
Calcium: 9 mg/dL (ref 8.9–10.3)
Chloride: 99 mmol/L (ref 98–111)
Creatinine, Ser: 1.04 mg/dL (ref 0.61–1.24)
GFR, Estimated: >60 mL/min (ref >60)
Glucose, Bld: 148 mg/dL — ABNORMAL HIGH (ref 70–99)
Potassium: 4.8 mmol/L (ref 3.5–5.1)
Sodium: 138 mmol/L (ref 135–145)

## 2020-08-05 LAB — GLUCOSE, CAPILLARY
Glucose-Capillary: 140 mg/dL — ABNORMAL HIGH (ref 70–99)
Glucose-Capillary: 192 mg/dL — ABNORMAL HIGH (ref 70–99)
Glucose-Capillary: 138 mg/dL — ABNORMAL HIGH (ref 70–99)
Glucose-Capillary: 112 mg/dL — ABNORMAL HIGH (ref 70–99)

## 2020-08-05 LAB — FIBRINOGEN: Fibrinogen: 446 mg/dL (ref 210–475)

## 2020-08-05 LAB — FIBRIN DERIVATIVES D-DIMER (ARMC ONLY): Fibrin derivatives D-dimer (ARMC): 886.69 ng/mL (FEU) — ABNORMAL HIGH (ref 0.00–499.00)

## 2020-08-05 LAB — C-REACTIVE PROTEIN: CRP: 1.9 mg/dL — ABNORMAL HIGH (ref <1.0)

## 2020-08-05 NOTE — Progress Notes (Signed)
PROGRESS NOTE    Carl Bell  DJM:[id number] DOB: [birth year] DOA: 08/03/2020 PCP: Kirk Ruths, MD    Brief Narrative:  Carl Bell is a 70 y.o. male with medical history significant of  COPD not on home O2, HTN, Anemia,Lung CA Status post chemotherapy and radiotherapy in 2017 with no recurrence,continued tobacco abuse  Patient presents to ED BIBEMS with complaint of sob/severe cough/ fever x 2 days. In the field per EMS saturation 93% on ra , patient placed on 3L with improvement to 100%.   Patient found to be COVID-positive EKG with ST depressioin  1/23-no overnight issues.   Consultants:   Cardiology  Procedures:   Antimicrobials:       Subjective: Patient denies shortness of breath, chest pain, fever or chills  Objective: Vitals:   08/04/20 1530 08/04/20 2059 08/05/20 0018 08/05/20 0536  BP: 133/87 136/84 (!) 135/91 (!) 148/106  Pulse:  78 72 79  Resp:  18 16 18   Temp:  (!) 97.5 F (36.4 C) 98.9 F (37.2 C) 98.1 F (36.7 C)  TempSrc:  Oral Oral Oral  SpO2:  100% 100% 99%  Weight:      Height:        Intake/Output Summary (Last 24 hours) at 08/05/2020 0804 Last data filed at 08/05/2020 0557 Gross per 24 hour  Intake -  Output 600 ml  Net -600 ml   Filed Weights   08/03/20 0816  Weight: 68.8 kg    Examination: Eating breakfast, NAD Increased expiratory time, minimal wheezing Regular S1-S2 no gallops Soft benign positive bowel sounds No edema Alert oriented x3, grossly intact Mood and affect appropriate in current setting   Data Reviewed: I have personally reviewed following labs and imaging studies  CBC: Recent Labs  Lab 08/03/20 0815 08/05/20 0535  WBC 6.1 3.2*  NEUTROABS 4.9  --   HGB 10.1* 10.5*  HCT 32.5* 32.0*  MCV 72.2* 71.1*  PLT 182 222   Basic Metabolic Panel: Recent Labs  Lab 08/03/20 0815  NA 136  K 4.1  CL 99  CO2 24  GLUCOSE 118*  BUN 13  CREATININE 0.84  CALCIUM 9.3   GFR: Estimated Creatinine  Clearance: 78.5 mL/min (by C-G formula based on SCr of 0.84 mg/dL). Liver Function Tests: Recent Labs  Lab 08/03/20 0815  AST 23  ALT 14  ALKPHOS 58  BILITOT 1.8*  PROT 7.3  ALBUMIN 3.8   No results for input(s): LIPASE, AMYLASE in the last 168 hours. No results for input(s): AMMONIA in the last 168 hours. Coagulation Profile: Recent Labs  Lab 08/03/20 1613  INR 1.2   Cardiac Enzymes: No results for input(s): CKTOTAL, CKMB, CKMBINDEX, TROPONINI in the last 168 hours. BNP (last 3 results) No results for input(s): PROBNP in the last 8760 hours. HbA1C: Recent Labs    08/03/20 1613  HGBA1C 5.3   CBG: Recent Labs  Lab 08/03/20 1949 08/04/20 1040 08/04/20 2025  GLUCAP 123* 135* 121*   Lipid Profile: Recent Labs    08/04/20 0500  CHOL 145  HDL 73  LDLCALC 62  TRIG 52  CHOLHDL 2.0   Thyroid Function Tests: Recent Labs    08/03/20 1613  TSH 0.292*   Anemia Panel: Recent Labs    08/03/20 1613  FERRITIN 21*  TIBC 451*  IRON 77   Sepsis Labs: No results for input(s): PROCALCITON, LATICACIDVEN in the last 168 hours.  No results found for this or any previous visit (from the past 240  hour(s)).       Radiology Studies: DG Chest 2 View  Result Date: 08/03/2020 CLINICAL DATA:  Shortness of breath and cough EXAM: CHEST - 2 VIEW COMPARISON:  07/08/2020 FINDINGS: Normal heart size and mediastinal contours. Small band of scarring at the lingula. Large lung volumes in the setting of emphysema. Blunting at the posterior costophrenic sulci is likely from the hyperinflation. Porta catheter on the right with tip in good position. There is no edema, consolidation, effusion, or pneumothorax. No acute osseous finding. IMPRESSION: Emphysema without acute superimposed finding. Electronically Signed   By: Monte Fantasia M.D.   On: 08/03/2020 09:03        Scheduled Meds: . aspirin EC  81 mg Oral Daily  . atorvastatin  10 mg Oral Daily  . enoxaparin (LOVENOX)  injection  1 mg/kg Subcutaneous Q12H  . latanoprost  1 drop Both Eyes QHS  . methylPREDNISolone (SOLU-MEDROL) injection  60 mg Intravenous Q12H  . metoprolol tartrate  12.5 mg Oral BID   Continuous Infusions: . remdesivir 100 mg in NS 100 mL      Assessment & Plan:   Principal Problem:   COVID Active Problems:   Demand ischemia (HCC)   SOB (shortness of breath)   AECOPD -due to viral infection  Continue IV steroids Continue Robitussin-DM Keep O2 more than 92% Continue inhalers   Covid + Initially presented at 93% on RA Continue on IV steroids Range and elevated  Continue IV remdesivir started on 1/22  Airborne contact precautions  Follow inflammatory markers   Abn CE/EKG -likely to demand ischemia Cardiology was consulted, did not feel it was consistent with ACS. Echo pending Continue aspirin, statin, beta-blockers    HTN-stable Continue beta-blockers    PVD  -followed by vascular (Dr Lucky Cowboy) -S/p left lower extremity angiogram and revascularization  Per pharmacy's review patient completed 1 year of Plavix since stent placement and it was in 12/21.  Patient has not been taking it for the last 30 days Continue on aspirin  Tobacco abuse -encourage cessation  -further education per RN protocol  Lung CA  -Status post chemotherapy and radiotherapy in 2017 - no recurrence.  HLD -continue statin   Glaucoma  Resume Xalatan  DVT prophylaxis: Lovenox Code Status: Full Family Communication: updated mother.   Status is: Inpatient  Remains inpatient appropriate because:Inpatient level of care appropriate due to severity of illness   Dispo: The patient is from: Home              Anticipated d/c is to: Home              Anticipated d/c date is: 3 days              Patient currently is not medically stable to d/c.   Difficult to place patient No            LOS: 2 days   Time spent: 35 minutes with more than 50% on [place],  MD [facility] Pager [phone]  If 7PM-7AM, please contact night-coverage 08/05/2020, 8:04 AM

## 2020-08-05 NOTE — Progress Notes (Addendum)
Progress Note  Patient Name: Carl Bell Date of Encounter: 08/05/2020  Primary Cardiologist: New to [facility] - consult by Shizuko Wojdyla  Subjective   No acute overnight events. Echo remains pending.   Inpatient Medications    Scheduled Meds: . aspirin EC  81 mg Oral Daily  . atorvastatin  10 mg Oral Daily  . enoxaparin (LOVENOX) injection  1 mg/kg Subcutaneous Q12H  . latanoprost  1 drop Both Eyes QHS  . methylPREDNISolone (SOLU-MEDROL) injection  60 mg Intravenous Q12H  . metoprolol tartrate  12.5 mg Oral BID   Continuous Infusions: . remdesivir 100 mg in NS 100 mL     PRN Meds: acetaminophen, albuterol, guaiFENesin-dextromethorphan, nitroGLYCERIN, ondansetron (ZOFRAN) IV   Vital Signs    Vitals:   08/04/20 1530 08/04/20 2059 08/05/20 0018 08/05/20 0536  BP: 133/87 136/84 (!) 135/91 (!) 148/106  Pulse:  78 72 79  Resp:  18 16 18   Temp:  (!) 97.5 F (36.4 C) 98.9 F (37.2 C) 98.1 F (36.7 C)  TempSrc:  Oral Oral Oral  SpO2:  100% 100% 99%  Weight:      Height:        Intake/Output Summary (Last 24 hours) at 08/05/2020 0741 Last data filed at 08/05/2020 0557 Gross per 24 hour  Intake -  Output 600 ml  Net -600 ml   Filed Weights   08/03/20 0816  Weight: 68.8 kg    Telemetry    SR (reviewed in Epic given patient in covid unit) - Personally Reviewed  ECG    No new tracings - Personally Reviewed  Physical Exam   Please see MD attestation given covid status.   Labs    Chemistry Recent Labs  Lab 08/03/20 0815  NA 136  K 4.1  CL 99  CO2 24  GLUCOSE 118*  BUN 13  CREATININE 0.84  CALCIUM 9.3  PROT 7.3  ALBUMIN 3.8  AST 23  ALT 14  ALKPHOS 58  BILITOT 1.8*  GFRNONAA >60  ANIONGAP 13     Hematology Recent Labs  Lab 08/03/20 0815 08/05/20 0535  WBC 6.1 3.2*  RBC 4.50 4.50  HGB 10.1* 10.5*  HCT 32.5* 32.0*  MCV 72.2* 71.1*  MCH 22.4* 23.3*  MCHC 31.1 32.8  RDW 21.1* 20.9*  PLT 182 200    Cardiac EnzymesNo results for  input(s): TROPONINI in the last 168 hours. No results for input(s): TROPIPOC in the last 168 hours.   BNP Recent Labs  Lab 08/03/20 1613  BNP 653.2*     DDimer No results for input(s): DDIMER in the last 168 hours.   Radiology    DG Chest 2 View  Result Date: 08/03/2020 IMPRESSION: Emphysema without acute superimposed finding. Electronically Signed   By: Monte Fantasia M.D.   On: 08/03/2020 09:03    Cardiac Studies   2D echo pending __________  2D echo 09/22/2014 ([facility]): INTERPRETATION  NORMAL LEFT VENTRICULAR SYSTOLIC FUNCTION = 10-93 %  NORMAL RIGHT VENTRICULAR SYSTOLIC FUNCTION  MILD TRICUSPID VALVE INSUFFICIENCY  TRACE MITRAL VALVE INSUFFICIENCY  NO VALVULAR STENOSIS   Patient Profile     70 y.o. male with history of lung cancer status post chemoradiation in 2017 with ongoing tobacco use, COPD not on supplemental oxygen, PVD status post left lower extremity angioplasty and stenting by vascular surgery in 06/2019 on aspirin and Plavix, HTN, HLD who is being seen today for the evaluation of elevated troponin at the request of Dr. Marcello Moores.  Assessment & Plan  1. Demand ischemia/abnormal EKG: -High-sensitivity troponin minimally elevated and flat trending peaking at 26 and subsequently down trending -Felt to be in the setting of supply demand ischemia with acute hypoxic respiratory failure secondary to underlying COVID infection and underlying hypertension -Not consistent with ACS -No indication for heparin drip from a troponin perspective at this time -Patient certainly has significant risk factors for CAD including ongoing tobacco use with underlying PVD and documented aortic atherosclerosis, male status with age 32, hypertension, and hyperlipidemia -Recommend obtaining echo, if this is unrevealing he can follow-up as an outpatient once he is improved from his acute illness for further ischemic evaluation -ASA -LDL 62, continue PTA atorvastatin 10 mg -A1c 5.3  2.  HTN: -BP mildly elevated this morning  -Continue metoprolol  3. HLD: -LDL at goal as outlined above -PTA atorvastatin  4. PVD: -Consider resuming PTA aspirin/Plavix, if indicated per IM  5. Acute hypoxic respiratory failure secondary to COVID infection/elevated D-dimer: -Management per internal medicine  For questions or updates, please contact [facility] Please consult [email or web address] for contact info under Cardiology/STEMI.    Signed, Christell Faith, PA-C [facility] Pager: [phone] 08/05/2020, 7:41 AM    I have reviewed the above assessment and plan and discussed with Christell Faith.  Changes to above are made where necessary.  The patient presents with COVID.  No indication of ACS.  Awaiting echo.  No plans for invasive cardiovascular workup at this time.  Co Sign: Thompson Grayer, MD 08/05/2020 3:36 PM

## 2020-08-06 ENCOUNTER — Inpatient Hospital Stay (HOSPITAL_COMMUNITY)
Admit: 2020-08-06 | Discharge: 2020-08-06 | Disposition: A | Discharge status: Home / Self Care | Payer: Medicare (Managed Care) | Attending: Physician Assistant | Admitting: Physician Assistant

## 2020-08-06 DIAGNOSIS — U071 COVID-19: Secondary | ICD-10-CM | POA: Diagnosis not present

## 2020-08-06 DIAGNOSIS — R9431 Abnormal electrocardiogram [ECG] [EKG]: Secondary | ICD-10-CM

## 2020-08-06 DIAGNOSIS — I361 Nonrheumatic tricuspid (valve) insufficiency: Secondary | ICD-10-CM | POA: Diagnosis not present

## 2020-08-06 LAB — BASIC METABOLIC PANEL
Anion gap: 8 (ref 5–15)
BUN: 25 mg/dL — ABNORMAL HIGH (ref 8–23)
CO2: 28 mmol/L (ref 22–32)
Calcium: 8.5 mg/dL — ABNORMAL LOW (ref 8.9–10.3)
Chloride: 101 mmol/L (ref 98–111)
Creatinine, Ser: 0.88 mg/dL (ref 0.61–1.24)
GFR, Estimated: >60 mL/min (ref >60)
Glucose, Bld: 120 mg/dL — ABNORMAL HIGH (ref 70–99)
Potassium: 4.4 mmol/L (ref 3.5–5.1)
Sodium: 137 mmol/L (ref 135–145)

## 2020-08-06 LAB — FIBRIN DERIVATIVES D-DIMER (ARMC ONLY): Fibrin derivatives D-dimer (ARMC): 515.35 ng/mL (FEU) — ABNORMAL HIGH (ref 0.00–499.00)

## 2020-08-06 LAB — ECHOCARDIOGRAM COMPLETE
Height: 73 in
S' Lateral: 2.7 cm
Weight: 2426.82 oz

## 2020-08-06 LAB — FIBRINOGEN: Fibrinogen: 335 mg/dL (ref 210–475)

## 2020-08-06 LAB — GLUCOSE, CAPILLARY
Glucose-Capillary: 122 mg/dL — ABNORMAL HIGH (ref 70–99)
Glucose-Capillary: 164 mg/dL — ABNORMAL HIGH (ref 70–99)
Glucose-Capillary: 173 mg/dL — ABNORMAL HIGH (ref 70–99)
Glucose-Capillary: 127 mg/dL — ABNORMAL HIGH (ref 70–99)

## 2020-08-06 LAB — C-REACTIVE PROTEIN: CRP: 0.6 mg/dL (ref <1.0)

## 2020-08-06 MED ORDER — ENOXAPARIN SODIUM 40 MG/0.4ML ~~LOC~~ SOLN
40.0000 mg | SUBCUTANEOUS | Status: DC
  Administered 2020-08-07 – 2020-08-08 (×2): 40 mg via SUBCUTANEOUS
  Filled 2020-08-06 (×2): qty 0.4

## 2020-08-06 MED ORDER — DORZOLAMIDE HCL-TIMOLOL MAL 2-0.5 % OP SOLN
1.0000 [drp] | Freq: Two times a day (BID) | OPHTHALMIC | Status: DC
  Administered 2020-08-06 – 2020-08-08 (×4): 1 [drp] via OPHTHALMIC
  Filled 2020-08-06: qty 10

## 2020-08-06 NOTE — Evaluation (Signed)
Physical Therapy Evaluation Patient Details Name: KEMON DEVINCENZI MRN: [id number] DOB: 03/28/1951 Today's Date: 08/06/2020   History of Present Illness  Presented to ER secondary to SOB, cough, fever; admitted for management of acute hypoxic respiratory failure related to AECOPD, COVID-19 PNA.  Also noted with elevation in troponin; demand ischemia per cardiology.  Clinical Impression  Upon evaluation, patient alert and oriented; follows commands and agreeable to participation with session, mod encouragement required.  Reports feeling generally fatigued and not sleeping well overnight.  Bilat UE/LE strength and ROM grossly symmetrical and WFL; no focal weakness appreciated.  Able to complete bed mobility with mod indep; sit/stand, basic transfers and gait (60') without assist device, cga.  Demonstrates mild forward trunk flexion, limited trunk rotation and arm swing; fair step height/length.  Mild sway, but self-corrects without overt buckling or LOB.  Patient self-limiting distance due to fatigue.  Sats 90-91% on RA with gait trials.  Will continue to progress/assess in subsequent sessions as appropriate. Would benefit from skilled PT to address above deficits and promote optimal return to PLOF.; Recommend transition to HHPT upon discharge from acute hospitalization.     Follow Up Recommendations Home health PT    Equipment Recommendations       Recommendations for Other Services       Precautions / Restrictions Precautions Precautions: Fall Restrictions Weight Bearing Restrictions: No      Mobility  Bed Mobility Overal bed mobility: Modified Independent                  Transfers Overall transfer level: Needs assistance Equipment used: None Transfers: Sit to/from Stand Sit to Stand: Min guard            Ambulation/Gait Ambulation/Gait assistance: Min guard Gait Distance (Feet): 60 Feet Assistive device: None       General Gait Details: mild forward trunk  flexion, limited trunk rotation and arm swing; fair step height/length.  Mild sway, but self-corrects without overt buckling or LOB.  Patient self-limiting distance due to fatigue.  Sats 90-91% on RA with gait trials.  Stairs            Wheelchair Mobility    Modified Rankin (Stroke Patients Only)       Balance Overall balance assessment: Needs assistance Sitting-balance support: No upper extremity supported;Feet supported Sitting balance-Leahy Scale: Good     Standing balance support: No upper extremity supported Standing balance-Leahy Scale: Fair                               Pertinent Vitals/Pain Pain Assessment: No/denies pain    Home Living Family/patient expects to be discharged to:: Private residence Living Arrangements: Parent Available Help at Discharge: Family Type of Home: House Home Access: Level entry     Home Layout: One level        Prior Function Level of Independence: Independent         Comments: Indep with ADLs, household and community mobilization; denies fall history; no home O2.     Hand Dominance        Extremity/Trunk Assessment   Upper Extremity Assessment Upper Extremity Assessment: Overall WFL for tasks assessed    Lower Extremity Assessment Lower Extremity Assessment: Overall WFL for tasks assessed (grossly 4/5 throughout)       Communication   Communication: No difficulties  Cognition Arousal/Alertness: Awake/alert Behavior During Therapy: WFL for tasks assessed/performed Overall Cognitive Status: Within Functional Limits for  tasks assessed                                        General Comments      Exercises     Assessment/Plan    PT Assessment Patient needs continued PT services  PT Problem List Decreased strength;Decreased activity tolerance;Decreased balance;Decreased mobility;Decreased knowledge of use of DME;Decreased safety awareness;Decreased knowledge of  precautions;Cardiopulmonary status limiting activity       PT Treatment Interventions DME instruction;Gait training;Functional mobility training;Therapeutic activities;Stair training;Balance training;Therapeutic exercise;Patient/family education    PT Goals (Current goals can be found in the Care Plan section)  Acute Rehab PT Goals Patient Stated Goal: to go home PT Goal Formulation: With patient Time For Goal Achievement: 08/20/20 Potential to Achieve Goals: Good    Frequency Min 2X/week   Barriers to discharge        Co-evaluation               AM-PAC PT "6 Clicks" Mobility  Outcome Measure Help needed turning from your back to your side while in a flat bed without using bedrails?: None Help needed moving from lying on your back to sitting on the side of a flat bed without using bedrails?: None Help needed moving to and from a bed to a chair (including a wheelchair)?: A Little Help needed standing up from a chair using your arms (e.g., wheelchair or bedside chair)?: A Little Help needed to walk in hospital room?: A Little Help needed climbing 3-5 steps with a railing? : A Little 6 Click Score: 20    End of Session   Activity Tolerance: Patient tolerated treatment well Patient left: in bed;with call bell/phone within reach;with bed alarm set Nurse Communication: Mobility status PT Visit Diagnosis: Muscle weakness (generalized) (M62.81);Difficulty in walking, not elsewhere classified (R26.2)    Time: 6579-0383 PT Time Calculation (min) (ACUTE ONLY): 19 min   Charges:   PT Evaluation $PT Eval Moderate Complexity: 1 Mod          Trason Shifflet H. Owens Shark, PT, DPT, NCS 08/06/20, 4:30 PM [phone]

## 2020-08-06 NOTE — Progress Notes (Signed)
PROGRESS NOTE    Carl Bell  HGD:[id number] DOB: Apr 15, 1951 DOA: 08/03/2020 PCP: Kirk Ruths, MD    Brief Narrative:  Carl Bell is a 70 y.o. male with medical history significant of  COPD not on home O2, HTN, Anemia,Lung CA Status post chemotherapy and radiotherapy in 2017 with no recurrence,continued tobacco abuse  Patient presents to ED BIBEMS with complaint of sob/severe cough/ fever x 2 days. In the field per EMS saturation 93% on ra , patient placed on 3L with improvement to 100%.   Patient found to be COVID-positive EKG with ST depressioin  1/23-no overnight issues.  1/24-sob mildly. Ambulated on RA around 90-91%  Consultants:   Cardiology  Procedures:   Antimicrobials:       Subjective: Sob today while sitting in bed, 02 sat stable at rest.   Objective: Vitals:   08/06/20 0512 08/06/20 0733 08/06/20 1011 08/06/20 1131  BP: (!) 131/92 128/79 120/72 127/77  Pulse: 89 79 80 68  Resp: 16 20  18   Temp: 98.3 F (36.8 C) 98.1 F (36.7 C)  97.9 F (36.6 C)  TempSrc: Oral Oral  Oral  SpO2: 100% 98%  97%  Weight:      Height:        Intake/Output Summary (Last 24 hours) at 08/06/2020 1143 Last data filed at 08/06/2020 0500 Gross per 24 hour  Intake 820 ml  Output 850 ml  Net -30 ml   Filed Weights   08/03/20 0816  Weight: 68.8 kg    Examination: NAD, calm Minimal wheezing bilaterally, increased breath sounds, no rales or rhonchi Regular S1-S2 no gallops Soft nontender nondistended positive bowel sounds No edema Alert oriented x3 grossly intact   Data Reviewed: I have personally reviewed following labs and imaging studies  CBC: Recent Labs  Lab 08/03/20 0815 08/05/20 0535  WBC 6.1 3.2*  NEUTROABS 4.9  --   HGB 10.1* 10.5*  HCT 32.5* 32.0*  MCV 72.2* 71.1*  PLT 182 962   Basic Metabolic Panel: Recent Labs  Lab 08/03/20 0815 08/05/20 0535 08/06/20 0827  NA 136 138 137  K 4.1 4.8 4.4  CL 99 99 101  CO2 24 26 28    GLUCOSE 118* 148* 120*  BUN 13 23 25*  CREATININE 0.84 1.04 0.88  CALCIUM 9.3 9.0 8.5*   GFR: Estimated Creatinine Clearance: 74.9 mL/min (by C-G formula based on SCr of 0.88 mg/dL). Liver Function Tests: Recent Labs  Lab 08/03/20 0815  AST 23  ALT 14  ALKPHOS 58  BILITOT 1.8*  PROT 7.3  ALBUMIN 3.8   No results for input(s): LIPASE, AMYLASE in the last 168 hours. No results for input(s): AMMONIA in the last 168 hours. Coagulation Profile: Recent Labs  Lab 08/03/20 1613  INR 1.2   Cardiac Enzymes: No results for input(s): CKTOTAL, CKMB, CKMBINDEX, TROPONINI in the last 168 hours. BNP (last 3 results) No results for input(s): PROBNP in the last 8760 hours. HbA1C: Recent Labs    08/03/20 1613  HGBA1C 5.3   CBG: Recent Labs  Lab 08/05/20 0817 08/05/20 1212 08/05/20 1715 08/05/20 1954 08/06/20 0824  GLUCAP 140* 192* 138* 112* 122*   Lipid Profile: Recent Labs    08/04/20 0500  CHOL 145  HDL 73  LDLCALC 62  TRIG 52  CHOLHDL 2.0   Thyroid Function Tests: Recent Labs    08/03/20 1613  TSH 0.292*   Anemia Panel: Recent Labs    08/03/20 1613  FERRITIN 21*  TIBC 451*  IRON 77   Sepsis Labs: No results for input(s): PROCALCITON, LATICACIDVEN in the last 168 hours.  No results found for this or any previous visit (from the past 240 hour(s)).       Radiology Studies: ECHOCARDIOGRAM COMPLETE  Result Date: 08/06/2020    ECHOCARDIOGRAM REPORT   Patient Name:   Carl Bell Date of Exam: 08/06/2020 Medical Rec #:  [id number]      Height:       73.0 in Accession #:    [id number]     Weight:       151.7 lb Date of Birth:  [birth year]       BSA:          1.913 m Patient Age:    38 years       BP:           120/72 mmHg Patient Gender: M              HR:           80 bpm. Exam Location:  [facility] Procedure: 2D Echo, Color Doppler and Cardiac Doppler Indications:     Abnormal ECG R94.31  History:         Patient has no prior history of Echocardiogram  examinations.                  COPD; Risk Factors:Hypertension.  Sonographer:     Sherrie Sport RDCS (AE) Referring Phys:  [id number] [place] Diagnosing Phys: Kathlyn Sacramento MD  Sonographer Comments: No apical window, no parasternal window and Technically challenging study due to limited acoustic windows. Image acquisition challenging due to COPD and Image acquisition challenging due to patient body habitus. IMPRESSIONS  1. Left ventricular ejection fraction, by estimation, is 55 to 60%. The left ventricle has normal function. Left ventricular endocardial border not optimally defined to evaluate regional wall motion. There is mild left ventricular hypertrophy. Left ventricular diastolic function could not be evaluated.  2. Right ventricular systolic function is normal. The right ventricular size is normal. There is moderately elevated pulmonary artery systolic pressure. The estimated right ventricular systolic pressure is 44.3 mmHg.  3. A small pericardial effusion is present. The pericardial effusion is posterior to the left ventricle.  4. The mitral valve is normal in structure. No evidence of mitral valve regurgitation. No evidence of mitral stenosis.  5. Tricuspid valve regurgitation is moderate.  6. The aortic valve is normal in structure. Aortic valve regurgitation is not visualized. Mild aortic valve sclerosis is present, with no evidence of aortic valve stenosis.  7. Very challenging image quality. FINDINGS  Left Ventricle: Left ventricular ejection fraction, by estimation, is 55 to 60%. The left ventricle has normal function. Left ventricular endocardial border not optimally defined to evaluate regional wall motion. The left ventricular internal cavity size was normal in size. There is mild left ventricular hypertrophy. Left ventricular diastolic function could not be evaluated. Right Ventricle: The right ventricular size is normal. No increase in right ventricular wall thickness. Right ventricular systolic  function is normal. There is moderately elevated pulmonary artery systolic pressure. The tricuspid regurgitant velocity is 3.18 m/s, and with an assumed right atrial pressure of 10 mmHg, the estimated right ventricular systolic pressure is 15.4 mmHg. Left Atrium: Left atrial size was normal in size. Right Atrium: Right atrial size was normal in size. Pericardium: A small pericardial effusion is present. The pericardial effusion is posterior to the left ventricle. Mitral Valve: The mitral valve is  normal in structure. No evidence of mitral valve regurgitation. No evidence of mitral valve stenosis. Tricuspid Valve: The tricuspid valve is normal in structure. Tricuspid valve regurgitation is moderate . No evidence of tricuspid stenosis. Aortic Valve: The aortic valve is normal in structure. Aortic valve regurgitation is not visualized. Mild aortic valve sclerosis is present, with no evidence of aortic valve stenosis. Pulmonic Valve: The pulmonic valve was normal in structure. Pulmonic valve regurgitation is not visualized. No evidence of pulmonic stenosis. Aorta: The aortic root is normal in size and structure. Venous: The inferior vena cava was not well visualized. IAS/Shunts: No atrial level shunt detected by color flow Doppler.  LEFT VENTRICLE PLAX 2D LVIDd:         3.90 cm LVIDs:         2.70 cm LV PW:         1.50 cm LV IVS:        1.00 cm  LEFT ATRIUM         Index      RIGHT ATRIUM           Index LA diam:    5.00 cm 2.61 cm/m RA Area:     19.60 cm                                RA Volume:   73.50 ml  38.42 ml/m  PULMONIC VALVE PV Vmax:        0.78 m/s PV Peak grad:   2.5 mmHg RVOT Peak grad: 5 mmHg  TRICUSPID VALVE TR Peak grad:   40.4 mmHg TR Vmax:        318.00 cm/s Kathlyn Sacramento MD Electronically signed by Kathlyn Sacramento MD Signature Date/Time: 08/06/2020/10:59:58 AM    Final         Scheduled Meds: . aspirin EC  81 mg Oral Daily  . atorvastatin  10 mg Oral Daily  . enoxaparin (LOVENOX)  injection  1 mg/kg Subcutaneous Q12H  . latanoprost  1 drop Both Eyes QHS  . methylPREDNISolone (SOLU-MEDROL) injection  60 mg Intravenous Q12H  . metoprolol tartrate  12.5 mg Oral BID   Continuous Infusions: . remdesivir 100 mg in NS 100 mL 100 mg (08/06/20 1018)    Assessment & Plan:   Principal Problem:   COVID Active Problems:   Demand ischemia (HCC)   SOB (shortness of breath)   AECOPD Due to viral infection IV steroids Continue with Robitussin-DM Keep O2 sat above 92% Continue inhalers    Covid + Initially presented at 93% on RA Ambulation 90 to 91% but become short of breath on room air  Inflammatory markers improving  Continue Remdisivir  started on 1/22  Continue IV steroids 60 mg twice daily  Airborne contact precautions  Follow inflammatory markers    Abn CE/EKG -likely to demand ischemia Cardiology was consulted, did not feel it was consistent with ACS. Echo with normal EF to define regional wall motion abnormality.  Small pericardial effusion posterior to the LV present Continue aspirin, statin, beta-blockers     HTN-stable Stable on beta-blockers    PVD  -followed by vascular (Dr Lucky Cowboy) -S/p left lower extremity angiogram and revascularization  Per pharmacy's review patient completed 1 year of Plavix since stent placement and it was in 12/21.  Patient has not been taking it for the last 30 days Continue on aspirin  Tobacco abuse -encourage cessation  -further education per RN  protocol  Lung CA  -Status post chemotherapy and radiotherapy in 2017 - no recurrence.  HLD -continue statin   Glaucoma  Resume Xalatan  DVT prophylaxis: Lovenox Code Status: Full Family Communication: updated mother.   Status is: Inpatient  Remains inpatient appropriate because:Inpatient level of care appropriate due to severity of illness   Dispo: The patient is from: Home              Anticipated d/c is to: Home              Anticipated d/c  date is: 1-2 days              Patient currently is not medically stable to d/c.still sob, needs iv steroid   Difficult to place patient No            LOS: 3 days   Time spent: 35 minutes with more than 50% on [place], MD [facility] Pager [phone]  If 7PM-7AM, please contact night-coverage 08/06/2020, 11:43 AM

## 2020-08-06 NOTE — Progress Notes (Signed)
*  PRELIMINARY RESULTS* Echocardiogram 2D Echocardiogram has been performed.  Sherrie Sport 08/06/2020, 10:15 AM

## 2020-08-07 DIAGNOSIS — U071 COVID-19: Secondary | ICD-10-CM | POA: Diagnosis not present

## 2020-08-07 LAB — GLUCOSE, CAPILLARY
Glucose-Capillary: 127 mg/dL — ABNORMAL HIGH (ref 70–99)
Glucose-Capillary: 159 mg/dL — ABNORMAL HIGH (ref 70–99)
Glucose-Capillary: 124 mg/dL — ABNORMAL HIGH (ref 70–99)
Glucose-Capillary: 120 mg/dL — ABNORMAL HIGH (ref 70–99)

## 2020-08-07 LAB — HEPATIC FUNCTION PANEL
ALT: 10 U/L (ref 0–44)
AST: 19 U/L (ref 15–41)
Albumin: 2.9 g/dL — ABNORMAL LOW (ref 3.5–5.0)
Alkaline Phosphatase: 43 U/L (ref 38–126)
Bilirubin, Direct: <0.1 mg/dL (ref 0.0–0.2)
Total Bilirubin: 0.4 mg/dL (ref 0.3–1.2)
Total Protein: 5.9 g/dL — ABNORMAL LOW (ref 6.5–8.1)

## 2020-08-07 NOTE — Care Management Important Message (Signed)
Important Message  Patient Details  Name: Carl Bell MRN: [id number] Date of Birth: 1951/05/22   Medicare Important Message Given:  Yes     Shelbie Hutching, RN 08/07/2020, 3:45 PM

## 2020-08-07 NOTE — Progress Notes (Addendum)
PROGRESS NOTE    Carl Bell  XAJ:[id number] DOB: 05/06/1951 DOA: 08/03/2020 PCP: Kirk Ruths, MD    Brief Narrative:  Carl Bell is a 70 y.o. male with medical history significant of  COPD not on home O2, HTN, Anemia,Lung CA Status post chemotherapy and radiotherapy in 2017 with no recurrence,continued tobacco abuse  Patient presents to ED BIBEMS with complaint of sob/severe cough/ fever x 2 days. In the field per EMS saturation 93% on ra , patient placed on 3L with improvement to 100%.   Patient found to be COVID-positive EKG with ST depressioin  1/23-no overnight issues.  1/24-sob mildly. Ambulated on RA around 90-91%  1/25-With ambulation heart rate went up to 140s and respiratory rate to 40s.  Patient was very fatigued with only 100 feet of walking.did not desat per PT.   Consultants:   Cardiology  Procedures:   Antimicrobials:       Subjective: Report mild DOE  No cp. No dizziness.   Objective: Vitals:   08/06/20 1510 08/06/20 1958 08/07/20 0833 08/07/20 1157  BP: (!) 149/88 (!) 116/91 138/81 97/66  Pulse: 90 94 65 79  Resp: 20 18 20 20   Temp: 98.4 F (36.9 C) 97.7 F (36.5 C) 97.8 F (36.6 C) 98.2 F (36.8 C)  TempSrc:  Oral    SpO2: 100% 97%  99%  Weight:      Height:        Intake/Output Summary (Last 24 hours) at 08/07/2020 1325 Last data filed at 08/07/2020 0845 Gross per 24 hour  Intake --  Output 900 ml  Net -900 ml   Filed Weights   08/03/20 0816  Weight: 68.8 kg    Examination: Nad, calm Increase expiratory time , no rales Regular s1/s2 Soft benign +bs No edema [place]3, nad   Data Reviewed: I have personally reviewed following labs and imaging studies  CBC: Recent Labs  Lab 08/03/20 0815 08/05/20 0535  WBC 6.1 3.2*  NEUTROABS 4.9  --   HGB 10.1* 10.5*  HCT 32.5* 32.0*  MCV 72.2* 71.1*  PLT 182 094   Basic Metabolic Panel: Recent Labs  Lab 08/03/20 0815 08/05/20 0535 08/06/20 0827  NA 136 138 137  K  4.1 4.8 4.4  CL 99 99 101  CO2 24 26 28   GLUCOSE 118* 148* 120*  BUN 13 23 25*  CREATININE 0.84 1.04 0.88  CALCIUM 9.3 9.0 8.5*   GFR: Estimated Creatinine Clearance: 74.9 mL/min (by C-G formula based on SCr of 0.88 mg/dL). Liver Function Tests: Recent Labs  Lab 08/03/20 0815 08/06/20 0827  AST 23 19  ALT 14 10  ALKPHOS 58 43  BILITOT 1.8* 0.4  PROT 7.3 5.9*  ALBUMIN 3.8 2.9*   No results for input(s): LIPASE, AMYLASE in the last 168 hours. No results for input(s): AMMONIA in the last 168 hours. Coagulation Profile: Recent Labs  Lab 08/03/20 1613  INR 1.2   Cardiac Enzymes: No results for input(s): CKTOTAL, CKMB, CKMBINDEX, TROPONINI in the last 168 hours. BNP (last 3 results) No results for input(s): PROBNP in the last 8760 hours. HbA1C: No results for input(s): HGBA1C in the last 72 hours. CBG: Recent Labs  Lab 08/06/20 1205 08/06/20 1636 08/06/20 2145 08/07/20 0835 08/07/20 1156  GLUCAP 164* 173* 127* 127* 159*   Lipid Profile: No results for input(s): CHOL, HDL, LDLCALC, TRIG, CHOLHDL, LDLDIRECT in the last 72 hours. Thyroid Function Tests: No results for input(s): TSH, T4TOTAL, FREET4, T3FREE, THYROIDAB in the last  72 hours. Anemia Panel: No results for input(s): VITAMINB12, FOLATE, FERRITIN, TIBC, IRON, RETICCTPCT in the last 72 hours. Sepsis Labs: No results for input(s): PROCALCITON, LATICACIDVEN in the last 168 hours.  No results found for this or any previous visit (from the past 240 hour(s)).       Radiology Studies: ECHOCARDIOGRAM COMPLETE  Result Date: 08/06/2020    ECHOCARDIOGRAM REPORT   Patient Name:   Carl Bell Date of Exam: 08/06/2020 Medical Rec #:  [id number]      Height:       73.0 in Accession #:    [id number]     Weight:       151.7 lb Date of Birth:  April 21, 1951       BSA:          1.913 m Patient Age:    68 years       BP:           120/72 mmHg Patient Gender: M              HR:           80 bpm. Exam Location:  [facility] Procedure:  2D Echo, Color Doppler and Cardiac Doppler Indications:     Abnormal ECG R94.31  History:         Patient has no prior history of Echocardiogram examinations.                  COPD; Risk Factors:Hypertension.  Sonographer:     Sherrie Sport RDCS (AE) Referring Phys:  [id number] [place] Diagnosing Phys: Kathlyn Sacramento MD  Sonographer Comments: No apical window, no parasternal window and Technically challenging study due to limited acoustic windows. Image acquisition challenging due to COPD and Image acquisition challenging due to patient body habitus. IMPRESSIONS  1. Left ventricular ejection fraction, by estimation, is 55 to 60%. The left ventricle has normal function. Left ventricular endocardial border not optimally defined to evaluate regional wall motion. There is mild left ventricular hypertrophy. Left ventricular diastolic function could not be evaluated.  2. Right ventricular systolic function is normal. The right ventricular size is normal. There is moderately elevated pulmonary artery systolic pressure. The estimated right ventricular systolic pressure is 07.3 mmHg.  3. A small pericardial effusion is present. The pericardial effusion is posterior to the left ventricle.  4. The mitral valve is normal in structure. No evidence of mitral valve regurgitation. No evidence of mitral stenosis.  5. Tricuspid valve regurgitation is moderate.  6. The aortic valve is normal in structure. Aortic valve regurgitation is not visualized. Mild aortic valve sclerosis is present, with no evidence of aortic valve stenosis.  7. Very challenging image quality. FINDINGS  Left Ventricle: Left ventricular ejection fraction, by estimation, is 55 to 60%. The left ventricle has normal function. Left ventricular endocardial border not optimally defined to evaluate regional wall motion. The left ventricular internal cavity size was normal in size. There is mild left ventricular hypertrophy. Left ventricular diastolic function could not be  evaluated. Right Ventricle: The right ventricular size is normal. No increase in right ventricular wall thickness. Right ventricular systolic function is normal. There is moderately elevated pulmonary artery systolic pressure. The tricuspid regurgitant velocity is 3.18 m/s, and with an assumed right atrial pressure of 10 mmHg, the estimated right ventricular systolic pressure is 71.0 mmHg. Left Atrium: Left atrial size was normal in size. Right Atrium: Right atrial size was normal in size. Pericardium: A small pericardial effusion is present.  The pericardial effusion is posterior to the left ventricle. Mitral Valve: The mitral valve is normal in structure. No evidence of mitral valve regurgitation. No evidence of mitral valve stenosis. Tricuspid Valve: The tricuspid valve is normal in structure. Tricuspid valve regurgitation is moderate . No evidence of tricuspid stenosis. Aortic Valve: The aortic valve is normal in structure. Aortic valve regurgitation is not visualized. Mild aortic valve sclerosis is present, with no evidence of aortic valve stenosis. Pulmonic Valve: The pulmonic valve was normal in structure. Pulmonic valve regurgitation is not visualized. No evidence of pulmonic stenosis. Aorta: The aortic root is normal in size and structure. Venous: The inferior vena cava was not well visualized. IAS/Shunts: No atrial level shunt detected by color flow Doppler.  LEFT VENTRICLE PLAX 2D LVIDd:         3.90 cm LVIDs:         2.70 cm LV PW:         1.50 cm LV IVS:        1.00 cm  LEFT ATRIUM         Index      RIGHT ATRIUM           Index LA diam:    5.00 cm 2.61 cm/m RA Area:     19.60 cm                                RA Volume:   73.50 ml  38.42 ml/m  PULMONIC VALVE PV Vmax:        0.78 m/s PV Peak grad:   2.5 mmHg RVOT Peak grad: 5 mmHg  TRICUSPID VALVE TR Peak grad:   40.4 mmHg TR Vmax:        318.00 cm/s Kathlyn Sacramento MD Electronically signed by Kathlyn Sacramento MD Signature Date/Time: 08/06/2020/10:59:58  AM    Final         Scheduled Meds: . aspirin EC  81 mg Oral Daily  . atorvastatin  10 mg Oral Daily  . dorzolamide-timolol  1 drop Both Eyes BID  . enoxaparin (LOVENOX) injection  40 mg Subcutaneous Q24H  . latanoprost  1 drop Both Eyes QHS  . methylPREDNISolone (SOLU-MEDROL) injection  60 mg Intravenous Q12H  . metoprolol tartrate  12.5 mg Oral BID   Continuous Infusions: . remdesivir 100 mg in NS 100 mL 100 mg (08/07/20 1055)    Assessment & Plan:   Principal Problem:   COVID Active Problems:   Demand ischemia (HCC)   SOB (shortness of breath)   AECOPD Due to viral infection 1/25-With ambulation RR increases to 40's with HR up in 140 Continue on iv steroid Continue with Robitussin-Dm kep 02 above 92% Continue inhalers Incentive spirometer, flutter valve     Covid + Initially presented at 93% on RA Ambulation per PT O2 sats stable however heart rate increased so [place] respiratory rate.   Continue remdesivir started on 1/22  Continue IV steroids 60 mg twice daily  Inflammatory markers decreasing continue to monitor  Airborne contact precautions    Abn CE/EKG -likely to demand ischemia Cardiology was consulted, did not feel it was consistent with ACS. Echo with normal EF, difficult to define regional wall motion abnormalities.  Small pericardial effusion posterior to the LV present.   Continue aspirin, statin, beta-blockers    HTN-stable Stable, on beta-blockers    PVD  -followed by vascular (Dr Lucky Cowboy) -S/p left lower extremity angiogram and  revascularization  Per pharmacy's review patient completed 1 year of Plavix since stent placement and it was in 12/21.  Patient has not been taking it for the last 30 days Continue on aspirin  Tobacco abuse -encourage cessation  -further education per RN protocol  Lung CA  -Status post chemotherapy and radiotherapy in 2017 - no recurrence.  HLD -continue statin   Glaucoma  Resume Xalatan  DVT  prophylaxis: Lovenox Code Status: Full Family Communication: updated mother.   Status is: Inpatient  Remains inpatient appropriate because:Inpatient level of care appropriate due to severity of illness   Dispo: The patient is from: Home              Anticipated d/c is to: Home              Anticipated d/c date is: 1-2 days              Patient currently is not medically stable to d/c.still sob, needs iv steroid.  Respiratory rate high with ambulation so his heart rate.   Difficult to place patient No            LOS: 4 days   Time spent: 35 minutes with more than 50% on [place], MD [facility] Pager [phone]  If 7PM-7AM, please contact night-coverage 08/07/2020, 1:25 PM

## 2020-08-07 NOTE — Progress Notes (Signed)
Physical Therapy Treatment Patient Details Name: Carl Bell MRN: [id number] DOB: Dec 26, 1950 Today's Date: 08/07/2020    History of Present Illness Presented to ER secondary to SOB, cough, fever; admitted for management of acute hypoxic respiratory failure related to AECOPD, COVID-19 PNA.  Also noted with elevation in troponin; demand ischemia per cardiology.    PT Comments    Pt was long sitting in bed upon arriving. Dress and eager to DC home. Pt states he does not see very well but was able to get OOB and ambulate with CGA assistance. HR elevated to upper 140 s and RR into 40s. No desaturation but pt is very fatigued with ambulation only 100 ft. Took several minutes in sitting to recover. Overall he demonstrates safe physical abilities but cardio/respiratory concerns are observed. If pt DCs home, will benefit from HHPT to improve safety with ADL. He was in bed at conclusion of session with call bell in reach.     Follow Up Recommendations  Home health PT     Equipment Recommendations  None recommended by PT    Recommendations for Other Services       Precautions / Restrictions Precautions Precautions: Fall Restrictions Weight Bearing Restrictions: No    Mobility  Bed Mobility Overal bed mobility: Modified Independent                Transfers Overall transfer level: Needs assistance Equipment used: None Transfers: Sit to/from Stand Sit to Stand: Min guard         General transfer comment: CGA for safety  Ambulation/Gait Ambulation/Gait assistance: Min guard Gait Distance (Feet): 100 Feet Assistive device: None;IV Pole Gait Pattern/deviations: WFL(Within Functional Limits) Gait velocity: decreased   General Gait Details: Pt ambulated without LOB however does have HR elevation 148 with RR elevated to 44. Pt very SOB and took several minutes to recover.      Balance Overall balance assessment: Needs assistance Sitting-balance support: No upper  extremity supported;Feet supported Sitting balance-Leahy Scale: Good     Standing balance support: No upper extremity supported;During functional activity Standing balance-Leahy Scale: Fair       Cognition Arousal/Alertness: Awake/alert Behavior During Therapy: WFL for tasks assessed/performed Overall Cognitive Status: Within Functional Limits for tasks assessed      General Comments: Pt is A and O x 4             Pertinent Vitals/Pain Pain Assessment: No/denies pain           PT Goals (current goals can now be found in the care plan section) Acute Rehab PT Goals Patient Stated Goal: to go home Progress towards PT goals: Progressing toward goals    Frequency    Min 2X/week      PT Plan Current plan remains appropriate       AM-PAC PT "6 Clicks" Mobility   Outcome Measure  Help needed turning from your back to your side while in a flat bed without using bedrails?: None Help needed moving from lying on your back to sitting on the side of a flat bed without using bedrails?: None Help needed moving to and from a bed to a chair (including a wheelchair)?: A Little Help needed standing up from a chair using your arms (e.g., wheelchair or bedside chair)?: A Little Help needed to walk in hospital room?: A Little Help needed climbing 3-5 steps with a railing? : A Little 6 Click Score: 20    End of Session Equipment Utilized During Treatment: Gait belt  Activity Tolerance: Patient limited by fatigue;Patient tolerated treatment well Patient left: in bed;with call bell/phone within reach;with bed alarm set Nurse Communication: Mobility status PT Visit Diagnosis: Muscle weakness (generalized) (M62.81);Difficulty in walking, not elsewhere classified (R26.2)     Time: 0950-1020 PT Time Calculation (min) (ACUTE ONLY): 30 min  Charges:  $Gait Training: 8-22 mins $Therapeutic Activity: 8-22 mins                     Julaine Fusi PTA 08/07/20, 12:56 PM

## 2020-08-07 NOTE — TOC Initial Note (Signed)
Transition of Care [facility]) - Initial/Assessment Note    Patient Details  Name: Carl Bell MRN: [id number] Date of Birth: Dec 01, 1950  [facility] CM/SW Contact:    Shelbie Hutching, RN Phone Number: 08/07/2020, 3:45 PM  Clinical Narrative:                 Patient admitted to the hospital with COPD exacerbation and COVID 19.  RNCM met with patient at the bedside.  Patient is from home with his mother.  He reports that he is independent, he does not drive because his vision is not good.  Family and or friends provide transportation, he reports sometimes he has to pay for transportation.  Patient is current with PCP Dr. Ouida Sills.  PT is recommending home health services and patient states "that would be a good idea".  Patient has no preference in agency, Corene Cornea with [facility] accepted referral for RN, PT, and OT.  Patient has no DME needs.     Expected Discharge Plan: [place] Barriers to Discharge: Continued Medical Work up   Patient Goals and CMS Choice Patient states their goals for this hospitalization and ongoing recovery are:: wants to go home with home health services CMS Medicare.gov Compare Post Acute Care list provided to:: Patient Choice offered to / list presented to : Patient  Expected Discharge Plan and Services Expected Discharge Plan: [place]   Discharge Planning Services: CM Consult Post Acute Care Choice: [place] arrangements for the past 2 months: Single Family Home                 DME Arranged: N/A DME Agency: NA       HH Arranged: RN,PT,OT [place] Agency: [place] ([place]) Date [place]: 08/07/20 Time [place]: [place] Representative spoke with at [place]: Corene Cornea  Prior Living Arrangements/Services Living arrangements for the past 2 months: [place] Lives with:: Parents (Mother) Patient language and need for interpreter reviewed:: Yes Do you feel safe  going back to the place where you live?: Yes      Need for Family Participation in Patient Care: Yes (Comment) (COVID) Care giver support system in place?: Yes (comment) (mother, family and friends)   Criminal Activity/Legal Involvement Pertinent to Current Situation/Hospitalization: No - Comment as needed  Activities of Daily Living Home Assistive Devices/Equipment: None ADL Screening (condition at time of admission) Patient's cognitive ability adequate to safely complete daily activities?: Yes Is the patient deaf or have difficulty hearing?: No Does the patient have difficulty seeing, even when wearing glasses/contacts?: Yes Does the patient have difficulty concentrating, remembering, or making decisions?: No Patient able to express need for assistance with ADLs?: Yes Does the patient have difficulty dressing or bathing?: No Independently performs ADLs?: Yes (appropriate for developmental age) Does the patient have difficulty walking or climbing stairs?: Yes Weakness of Legs: None Weakness of Arms/Hands: None  Permission Sought/Granted Permission sought to share information with : Case Manager,Family Supports,Other (comment) Permission granted to share information with : Yes, Verbal Permission Granted  Share Information with NAME: Rod Holler  Permission granted to share info w AGENCY: [facility]  Permission granted to share info w Relationship: mother     Emotional Assessment Appearance:: Appears stated age Attitude/Demeanor/Rapport: Engaged Affect (typically observed): Accepting Orientation: : Oriented to Self,Oriented to Place,Oriented to  Time,Oriented to Situation Alcohol / Substance Use: Not Applicable Psych Involvement: No (comment)  Admission diagnosis:  SOB (shortness of  breath) [R06.02] ACS (acute coronary syndrome) (HCC) [I24.9] Elevated troponin [R77.8] COPD exacerbation (HCC) [J44.1] COVID-19 [U07.1] Patient Active Problem List   Diagnosis Date Noted  . COVID  08/04/2020  . Demand ischemia ([place]) 08/04/2020  . SOB (shortness of breath) 08/04/2020  . COPD exacerbation ([place]) 11/08/2019  . Acute exacerbation of chronic obstructive pulmonary disease (COPD) ([place]) 10/18/2019  . Hypertensive urgency 10/17/2019  . COPD with acute exacerbation ([place]) 09/12/2019  . Acute on chronic respiratory failure with hypoxia and hypercapnia ([place]) 09/12/2019  . Tobacco abuse 09/12/2019  . PAD (peripheral artery disease) ([place]) 09/12/2019  . HLD (hyperlipidemia) 09/12/2019  . HTN (hypertension) 09/12/2019  . Atherosclerosis of native arteries of the extremities with ulceration ([place]) 06/28/2019  . Sepsis ([place]) 12/24/2017  . Protein-calorie malnutrition, severe 11/12/2017  . Acute respiratory failure ([place]) 11/10/2017  . Acute respiratory distress 12/22/2015  . COPD (chronic obstructive pulmonary disease) ([place]) 12/22/2015  . Acute bronchitis 12/22/2015  . HTN, goal below 140/80 12/22/2015  . Lung cancer, lingula ([place]) 09/23/2015  . Chronic edema 09/15/2014   PCP:  Kirk Ruths, MD Pharmacy:   [facility], [address] [place] [address] Phone: [phone] Fax: [phone]     Social Determinants of Health (SDOH) Interventions    Readmission Risk Interventions No flowsheet data found.

## 2020-08-08 DIAGNOSIS — U071 COVID-19: Secondary | ICD-10-CM | POA: Diagnosis not present

## 2020-08-08 DIAGNOSIS — I248 Other forms of acute ischemic heart disease: Secondary | ICD-10-CM | POA: Diagnosis not present

## 2020-08-08 DIAGNOSIS — R0602 Shortness of breath: Secondary | ICD-10-CM | POA: Diagnosis not present

## 2020-08-08 LAB — GLUCOSE, CAPILLARY
Glucose-Capillary: 114 mg/dL — ABNORMAL HIGH (ref 70–99)
Glucose-Capillary: 218 mg/dL — ABNORMAL HIGH (ref 70–99)

## 2020-08-08 LAB — FIBRIN DERIVATIVES D-DIMER (ARMC ONLY): Fibrin derivatives D-dimer (ARMC): 366.93 ng/mL (FEU) (ref 0.00–499.00)

## 2020-08-08 MED ORDER — SENNOSIDES-DOCUSATE SODIUM 8.6-50 MG PO TABS: 1.0000 | ORAL_TABLET | Freq: Two times a day (BID) | ORAL | Status: DC

## 2020-08-08 MED ORDER — GUAIFENESIN-DM 100-10 MG/5ML PO SYRP: 5.0000 mL | ORAL_SOLUTION | ORAL | 0 refills | Status: DC | PRN

## 2020-08-08 MED ORDER — ASPIRIN 81 MG PO TBEC: 81.0000 mg | DELAYED_RELEASE_TABLET | Freq: Every day | ORAL | 2 refills | Status: DC

## 2020-08-08 MED ORDER — POLYETHYLENE GLYCOL 3350 17 G PO PACK: 17.0000 g | PACK | Freq: Every day | ORAL | Status: DC

## 2020-08-08 MED ORDER — BISACODYL 5 MG PO TBEC: 5.0000 mg | DELAYED_RELEASE_TABLET | Freq: Every day | ORAL | Status: DC | PRN

## 2020-08-08 MED ORDER — METOPROLOL TARTRATE 25 MG PO TABS: 12.5000 mg | ORAL_TABLET | Freq: Two times a day (BID) | ORAL | 2 refills | Status: DC

## 2020-08-08 MED ORDER — ATORVASTATIN CALCIUM 10 MG PO TABS: 10.0000 mg | ORAL_TABLET | Freq: Every day | ORAL | 2 refills | Status: DC

## 2020-08-08 NOTE — Discharge Summary (Signed)
Physician Discharge Summary  Carl Bell DUK:[id number] DOB: 11/26/1950 DOA: 08/03/2020  PCP: Kirk Ruths, MD  Admit date: 08/03/2020 Discharge date: 08/08/2020  Admitted From: home Disposition:  home  Recommendations for Outpatient Follow-up:  1. Follow up with PCP in 1-2 weeks 2. Please obtain BMP/CBC in one week 3. Please follow up on patient's recovery from Covid-19 infection   Home Health: PT, OT, RN Equipment/Devices: None   Discharge Condition: Stable  CODE STATUS: Full  Diet recommendation: Heart Healthy      Discharge Diagnoses: Principal Problem:   COVID Active Problems:   Demand ischemia ([place])   SOB (shortness of breath)    Summary of HPI and Hospital Course:  Carl Rhines Foustis a 70 y.o.malewith medical history significant ofCOPD not on home O2, HTN, Anemia,Lung CAStatus post chemotherapy and radiotherapy in 2017 with no recurrence, continued tobacco abuse presented to ED via EMS with complaint of sob/severe cough/ fever x 2 days. In the field per EMS saturation 93% on ra , patient placed on 3L with improvement to 100%.  In the ED, patient found to be COVID-positive with secondary acute exacerbation of COPD.    Covid-19 infection with secondary  Acute Exacerbation of COPD - Initially presented at 93% on RA Ambulation per PT O2 sats stable however pt becomes tachycardic and tachypneic with ambulation.   Treated with remdesivir, IV steroids, supportive care with bronchodilators, antitussives, mucolytics and pulmonary hygiene. Inflammatory markers were monitored and improved.   Elevated troponin and abnormal EKG secondary to demand ischemia in the setting of hypoxia and infection.  Cardiology was consulted.  Presentation not clinically consistent with ACS. Echo with normal EF, difficult to define regional wall motion abnormalities.  Small pericardial effusion posterior to the LV present.  Continue aspirin, statin, beta-blockers.  Hypertension -  stable, on beta-blocker  PVD - followed by vascular (Dr Lucky Cowboy), s/p LLE angiogram andrevascularization.  Per pharmacy's review patient completed 1 year of Plavix since stent placement and it was in 12/21.  Patient has not been taking Plavix for the past 30 days.   Continue on aspirin  Tobacco abuse - encourage cessation.  Primary care follow up.   Hx of Lung Cancer - status post chemotherapy and radiotherapy in 2017.  No recurrence.  No acute issues.  Hyperlipidemia - on statin   Glaucoma - on Xalatan   Discharge Instructions   Discharge Instructions    Call MD for:   Complete by: As directed    Worsening shortness of breath, if you cannot catch your breath with resting.   Call MD for:  extreme fatigue   Complete by: As directed    Call MD for:  persistant dizziness or light-headedness   Complete by: As directed    Call MD for:  persistant nausea and vomiting   Complete by: As directed    Call MD for:  severe uncontrolled pain   Complete by: As directed    Call MD for:  temperature >100.4   Complete by: As directed    Diet - low sodium heart healthy   Complete by: As directed    Increase activity slowly   Complete by: As directed      Allergies as of 08/08/2020   No Known Allergies     Medication List    STOP taking these medications   predniSONE 20 MG tablet Commonly known as: DELTASONE     TAKE these medications   albuterol 108 (90 Base) MCG/ACT inhaler Commonly known as: VENTOLIN  HFA Inhale 2-4 puffs by mouth every 4 hours as needed for wheezing, cough, and/or shortness of breath   aspirin 81 MG EC tablet Take 1 tablet (81 mg total) by mouth daily. Swallow whole. Start taking on: August 09, 2020   atorvastatin 10 MG tablet Commonly known as: LIPITOR Take 1 tablet (10 mg total) by mouth daily. Start taking on: August 09, 2020   dorzolamide-timolol 22.3-6.8 MG/ML ophthalmic solution Commonly known as: COSOPT Place 1 drop into both eyes 2 (two)  times daily.   guaiFENesin-dextromethorphan 100-10 MG/5ML syrup Commonly known as: ROBITUSSIN DM Take 5 mLs by mouth every 4 (four) hours as needed for cough.   latanoprost 0.005 % ophthalmic solution Commonly known as: XALATAN Place 1 drop into both eyes at bedtime.   metoprolol tartrate 25 MG tablet Commonly known as: LOPRESSOR Take 0.5 tablets (12.5 mg total) by mouth 2 (two) times daily.       No Known Allergies  Consultations:  cardiology   Procedures/Studies: DG Chest 2 View  Result Date: 08/03/2020 CLINICAL DATA:  Shortness of breath and cough EXAM: CHEST - 2 VIEW COMPARISON:  07/08/2020 FINDINGS: Normal heart size and mediastinal contours. Small band of scarring at the lingula. Large lung volumes in the setting of emphysema. Blunting at the posterior costophrenic sulci is likely from the hyperinflation. Porta catheter on the right with tip in good position. There is no edema, consolidation, effusion, or pneumothorax. No acute osseous finding. IMPRESSION: Emphysema without acute superimposed finding. Electronically Signed   By: Monte Fantasia M.D.   On: 08/03/2020 09:03   ECHOCARDIOGRAM COMPLETE  Result Date: 08/06/2020    ECHOCARDIOGRAM REPORT   Patient Name:   Carl Bell Date of Exam: 08/06/2020 Medical Rec #:  [id number]      Height:       73.0 in Accession #:    [id number]     Weight:       151.7 lb Date of Birth:  Apr 06, 1951       BSA:          1.913 m Patient Age:    70 years       BP:           120/72 mmHg Patient Gender: M              HR:           80 bpm. Exam Location:  [facility] Procedure: 2D Echo, Color Doppler and Cardiac Doppler Indications:     Abnormal ECG R94.31  History:         Patient has no prior history of Echocardiogram examinations.                  COPD; Risk Factors:Hypertension.  Sonographer:     Sherrie Sport RDCS (AE) Referring Phys:  [id number] [place] Diagnosing Phys: Kathlyn Sacramento MD  Sonographer Comments: No apical window, no parasternal window and  Technically challenging study due to limited acoustic windows. Image acquisition challenging due to COPD and Image acquisition challenging due to patient body habitus. IMPRESSIONS  1. Left ventricular ejection fraction, by estimation, is 55 to 60%. The left ventricle has normal function. Left ventricular endocardial border not optimally defined to evaluate regional wall motion. There is mild left ventricular hypertrophy. Left ventricular diastolic function could not be evaluated.  2. Right ventricular systolic function is normal. The right ventricular size is normal. There is moderately elevated pulmonary artery systolic pressure. The estimated right ventricular systolic pressure is  50.4 mmHg.  3. A small pericardial effusion is present. The pericardial effusion is posterior to the left ventricle.  4. The mitral valve is normal in structure. No evidence of mitral valve regurgitation. No evidence of mitral stenosis.  5. Tricuspid valve regurgitation is moderate.  6. The aortic valve is normal in structure. Aortic valve regurgitation is not visualized. Mild aortic valve sclerosis is present, with no evidence of aortic valve stenosis.  7. Very challenging image quality. FINDINGS  Left Ventricle: Left ventricular ejection fraction, by estimation, is 55 to 60%. The left ventricle has normal function. Left ventricular endocardial border not optimally defined to evaluate regional wall motion. The left ventricular internal cavity size was normal in size. There is mild left ventricular hypertrophy. Left ventricular diastolic function could not be evaluated. Right Ventricle: The right ventricular size is normal. No increase in right ventricular wall thickness. Right ventricular systolic function is normal. There is moderately elevated pulmonary artery systolic pressure. The tricuspid regurgitant velocity is 3.18 m/s, and with an assumed right atrial pressure of 10 mmHg, the estimated right ventricular systolic pressure is  78.6 mmHg. Left Atrium: Left atrial size was normal in size. Right Atrium: Right atrial size was normal in size. Pericardium: A small pericardial effusion is present. The pericardial effusion is posterior to the left ventricle. Mitral Valve: The mitral valve is normal in structure. No evidence of mitral valve regurgitation. No evidence of mitral valve stenosis. Tricuspid Valve: The tricuspid valve is normal in structure. Tricuspid valve regurgitation is moderate . No evidence of tricuspid stenosis. Aortic Valve: The aortic valve is normal in structure. Aortic valve regurgitation is not visualized. Mild aortic valve sclerosis is present, with no evidence of aortic valve stenosis. Pulmonic Valve: The pulmonic valve was normal in structure. Pulmonic valve regurgitation is not visualized. No evidence of pulmonic stenosis. Aorta: The aortic root is normal in size and structure. Venous: The inferior vena cava was not well visualized. IAS/Shunts: No atrial level shunt detected by color flow Doppler.  LEFT VENTRICLE PLAX 2D LVIDd:         3.90 cm LVIDs:         2.70 cm LV PW:         1.50 cm LV IVS:        1.00 cm  LEFT ATRIUM         Index      RIGHT ATRIUM           Index LA diam:    5.00 cm 2.61 cm/m RA Area:     19.60 cm                                RA Volume:   73.50 ml  38.42 ml/m  PULMONIC VALVE PV Vmax:        0.78 m/s PV Peak grad:   2.5 mmHg RVOT Peak grad: 5 mmHg  TRICUSPID VALVE TR Peak grad:   40.4 mmHg TR Vmax:        318.00 cm/s Kathlyn Sacramento MD Electronically signed by Kathlyn Sacramento MD Signature Date/Time: 08/06/2020/10:59:58 AM    Final        Subjective: Pt reports he feels well. No chest pain or palpitations.  Feels a bit short of breath ambulating, but tolerates and says improving.  No other acute complaints.    Discharge Exam: Vitals:   08/08/20 0357 08/08/20 1123  BP: (!) 130/104 102/71  Pulse:  71 95  Resp: 17 16  Temp: 98 F (36.7 C) 98 F (36.7 C)  SpO2: 97% 100%   Vitals:    08/07/20 2326 08/08/20 0357 08/08/20 0450 08/08/20 1123  BP: (!) 139/93 (!) 130/104  102/71  Pulse: 65 71  95  Resp: 17 17  16   Temp: 98 F (36.7 C) 98 F (36.7 C)  98 F (36.7 C)  TempSrc: Oral Oral    SpO2: 100% 97%  100%  Weight:   68.6 kg   Height:        General: Pt is alert, awake, not in acute distress Cardiovascular: RRR, S1/S2 +, no rubs, no gallops Respiratory: decreased breath sounds but overall clear with improving aeration, no wheezing, no rhonchi Abdominal: Soft, NT, ND, bowel sounds + Extremities: no edema, no cyanosis    The results of significant diagnostics from this hospitalization (including imaging, microbiology, ancillary and laboratory) are listed below for reference.     Microbiology: No results found for this or any previous visit (from the past 240 hour(s)).   Labs: BNP (last 3 results) Recent Labs    11/08/19 1814 08/03/20 1613  BNP 174.0* 101.7*   Basic Metabolic Panel: Recent Labs  Lab 08/03/20 0815 08/05/20 0535 08/06/20 0827  NA 136 138 137  K 4.1 4.8 4.4  CL 99 99 101  CO2 24 26 28   GLUCOSE 118* 148* 120*  BUN 13 23 25*  CREATININE 0.84 1.04 0.88  CALCIUM 9.3 9.0 8.5*   Liver Function Tests: Recent Labs  Lab 08/03/20 0815 08/06/20 0827  AST 23 19  ALT 14 10  ALKPHOS 58 43  BILITOT 1.8* 0.4  PROT 7.3 5.9*  ALBUMIN 3.8 2.9*   No results for input(s): LIPASE, AMYLASE in the last 168 hours. No results for input(s): AMMONIA in the last 168 hours. CBC: Recent Labs  Lab 08/03/20 0815 08/05/20 0535  WBC 6.1 3.2*  NEUTROABS 4.9  --   HGB 10.1* 10.5*  HCT 32.5* 32.0*  MCV 72.2* 71.1*  PLT 182 200   Cardiac Enzymes: No results for input(s): CKTOTAL, CKMB, CKMBINDEX, TROPONINI in the last 168 hours. BNP: Invalid input(s): POCBNP CBG: Recent Labs  Lab 08/07/20 1156 08/07/20 1742 08/07/20 2153 08/08/20 0804 08/08/20 1124  GLUCAP 159* 124* 120* 114* 218*   D-Dimer No results for input(s): DDIMER in the  last 72 hours. Hgb A1c No results for input(s): HGBA1C in the last 72 hours. Lipid Profile No results for input(s): CHOL, HDL, LDLCALC, TRIG, CHOLHDL, LDLDIRECT in the last 72 hours. Thyroid function studies No results for input(s): TSH, T4TOTAL, T3FREE, THYROIDAB in the last 72 hours.  Invalid input(s): FREET3 Anemia work up No results for input(s): VITAMINB12, FOLATE, FERRITIN, TIBC, IRON, RETICCTPCT in the last 72 hours. Urinalysis    Component Value Date/Time   COLORURINE YELLOW (A) 12/24/2017 0914   APPEARANCEUR HAZY (A) 12/24/2017 0914   LABSPEC 1.019 12/24/2017 0914   PHURINE 5.0 12/24/2017 0914   GLUCOSEU NEGATIVE 12/24/2017 0914   HGBUR NEGATIVE 12/24/2017 0914   BILIRUBINUR NEGATIVE 12/24/2017 0914   KETONESUR 5 (A) 12/24/2017 0914   PROTEINUR NEGATIVE 12/24/2017 0914   NITRITE NEGATIVE 12/24/2017 0914   LEUKOCYTESUR SMALL (A) 12/24/2017 0914   Sepsis Labs Invalid input(s): PROCALCITONIN,  WBC,  LACTICIDVEN Microbiology No results found for this or any previous visit (from the past 240 hour(s)).   Time coordinating discharge: Over 30 minutes  SIGNED:   Ezekiel Slocumb, DO [facility] 08/08/2020, 2:15 PM  If 7PM-7AM, please contact night-coverage [email or web address]

## 2020-08-08 NOTE — Progress Notes (Signed)
Patient able to ambulate 150 feet on room air and maintain O2 sats 89% or above with HR 102.  He did not c/o weakness, SOB or dizziness on ambulation.

## 2020-08-08 NOTE — TOC Transition Note (Addendum)
Transition of Care [facility]) - CM/SW Discharge Note   Patient Details  Name: Carl Bell MRN: [id number] Date of Birth: 02/08/1951  Transition of Care [facility]) CM/SW Contact:  Carl Ivan, LCSW Phone Number: 08/08/2020, 2:43 PM   Clinical Narrative:   Patient to discharge home today. Informed [place], they will be following for PT, OT, and RN services. Informed patient's mother Carl Bell. She confirmed home address in chart. Per RN, patient need a ride home and appropriate for [employer]. RN to have patient sign [employer]. CSW called [employer], COVID + transport home arranged with Carl Bell. They will pick patient up at [place] entrance within 30-45 minutes, updated RN. No other needs prior to DC.    Final next level of care: [place] Barriers to Discharge: Barriers Resolved   Patient Goals and CMS Choice Patient states their goals for this hospitalization and ongoing recovery are:: home with home health CMS Medicare.gov Compare Post Acute Care list provided to:: Patient Choice offered to / list presented to : [place]  Discharge Placement                Patient to be transferred to facility by: transported by [facility] Name of family member notified: Carl Bell- mother Patient and family notified of of transfer: 08/08/20  Discharge Plan and Services   Discharge Planning Services: CM Consult Post Acute Care Choice: Home Health          DME Arranged: N/A DME Agency: NA       HH Arranged: RN,PT,OT [place] Agency: [place] ([place]) Date [place]: 08/08/20 Time [place]: [place] Representative spoke with at [place]: Carl Bell  Social Determinants of Health ([place]) Interventions     Readmission Risk Interventions No flowsheet data found.

## 2020-08-08 NOTE — Progress Notes (Signed)
Patient discharged to home via [facility].  Leaves the unit with belongings and AVS in hand in w/c with transport staff.

## 2020-08-24 ENCOUNTER — Ambulatory Visit: Payer: Medicare (Managed Care) | Admitting: Cardiology

## 2020-08-27 ENCOUNTER — Encounter: Payer: Self-pay | Admitting: Cardiology

## 2021-05-13 ENCOUNTER — Other Ambulatory Visit: Payer: Self-pay | Admitting: Registered Nurse

## 2021-05-13 DIAGNOSIS — R911 Solitary pulmonary nodule: Secondary | ICD-10-CM

## 2021-05-13 DIAGNOSIS — N281 Cyst of kidney, acquired: Secondary | ICD-10-CM

## 2021-06-13 ENCOUNTER — Other Ambulatory Visit: Payer: Medicare (Managed Care)

## 2021-07-07 ENCOUNTER — Observation Stay
Admission: EM | Admit: 2021-07-07 | Discharge: 2021-07-08 | Disposition: A | Discharge status: Home / Self Care | Payer: Medicare HMO | Attending: Internal Medicine | Admitting: Internal Medicine

## 2021-07-07 ENCOUNTER — Other Ambulatory Visit: Payer: Self-pay

## 2021-07-07 ENCOUNTER — Emergency Department: Payer: Medicare HMO

## 2021-07-07 DIAGNOSIS — D649 Anemia, unspecified: Secondary | ICD-10-CM | POA: Diagnosis not present

## 2021-07-07 DIAGNOSIS — J441 Chronic obstructive pulmonary disease with (acute) exacerbation: Principal | ICD-10-CM | POA: Diagnosis present

## 2021-07-07 DIAGNOSIS — J9601 Acute respiratory failure with hypoxia: Secondary | ICD-10-CM | POA: Insufficient documentation

## 2021-07-07 DIAGNOSIS — I10 Essential (primary) hypertension: Secondary | ICD-10-CM | POA: Insufficient documentation

## 2021-07-07 DIAGNOSIS — Z8616 Personal history of COVID-19: Secondary | ICD-10-CM | POA: Diagnosis not present

## 2021-07-07 DIAGNOSIS — R0602 Shortness of breath: Secondary | ICD-10-CM | POA: Diagnosis present

## 2021-07-07 DIAGNOSIS — F17210 Nicotine dependence, cigarettes, uncomplicated: Secondary | ICD-10-CM | POA: Diagnosis not present

## 2021-07-07 DIAGNOSIS — Z85118 Personal history of other malignant neoplasm of bronchus and lung: Secondary | ICD-10-CM | POA: Insufficient documentation

## 2021-07-07 DIAGNOSIS — Z20822 Contact with and (suspected) exposure to covid-19: Secondary | ICD-10-CM | POA: Insufficient documentation

## 2021-07-07 LAB — COMPREHENSIVE METABOLIC PANEL
ALT: 12 U/L (ref 0–44)
AST: 24 U/L (ref 15–41)
Albumin: 4.1 g/dL (ref 3.5–5.0)
Alkaline Phosphatase: 65 U/L (ref 38–126)
Anion gap: 11 (ref 5–15)
BUN: 9 mg/dL (ref 8–23)
CO2: 23 mmol/L (ref 22–32)
Calcium: 9.1 mg/dL (ref 8.9–10.3)
Chloride: 99 mmol/L (ref 98–111)
Creatinine, Ser: 0.54 mg/dL — ABNORMAL LOW (ref 0.61–1.24)
GFR, Estimated: >60 mL/min (ref >60)
Glucose, Bld: 123 mg/dL — ABNORMAL HIGH (ref 70–99)
Potassium: 4 mmol/L (ref 3.5–5.1)
Sodium: 133 mmol/L — ABNORMAL LOW (ref 135–145)
Total Bilirubin: 1.6 mg/dL — ABNORMAL HIGH (ref 0.3–1.2)
Total Protein: 8.1 g/dL (ref 6.5–8.1)

## 2021-07-07 LAB — CBC WITH DIFFERENTIAL/PLATELET
Abs Immature Granulocytes: 0.01 10*3/uL (ref 0.00–0.07)
Basophils Absolute: 0 10*3/uL (ref 0.0–0.1)
Basophils Relative: 0 %
Eosinophils Absolute: 0.1 10*3/uL (ref 0.0–0.5)
Eosinophils Relative: 1 %
HCT: 33.7 % — ABNORMAL LOW (ref 39.0–52.0)
Hemoglobin: 10.4 g/dL — ABNORMAL LOW (ref 13.0–17.0)
Immature Granulocytes: 0 %
Lymphocytes Relative: 9 %
Lymphs Abs: 0.5 10*3/uL — ABNORMAL LOW (ref 0.7–4.0)
MCH: 24.8 pg — ABNORMAL LOW (ref 26.0–34.0)
MCHC: 30.9 g/dL (ref 30.0–36.0)
MCV: 80.2 fL (ref 80.0–100.0)
Monocytes Absolute: 0.7 10*3/uL (ref 0.1–1.0)
Monocytes Relative: 12 %
Neutro Abs: 4.4 10*3/uL (ref 1.7–7.7)
Neutrophils Relative %: 78 %
Platelets: 114 10*3/uL — ABNORMAL LOW (ref 150–400)
RBC: 4.2 MIL/uL — ABNORMAL LOW (ref 4.22–5.81)
RDW: 16.4 % — ABNORMAL HIGH (ref 11.5–15.5)
WBC: 5.6 10*3/uL (ref 4.0–10.5)
nRBC: 0 % (ref 0.0–0.2)

## 2021-07-07 LAB — RESP PANEL BY RT-PCR (FLU A&B, COVID) ARPGX2
Influenza A by PCR: NEGATIVE
Influenza B by PCR: NEGATIVE
SARS Coronavirus 2 by RT PCR: NEGATIVE

## 2021-07-07 LAB — BLOOD GAS, VENOUS
Acid-Base Excess: 1.3 mmol/L (ref 0.0–2.0)
Bicarbonate: 29.3 mmol/L — ABNORMAL HIGH (ref 20.0–28.0)
O2 Saturation: 61.1 %
Patient temperature: 37
pCO2, Ven: 61 mmHg — ABNORMAL HIGH (ref 44.0–60.0)
pH, Ven: 7.29 (ref 7.250–7.430)
pO2, Ven: 36 mmHg (ref 32.0–45.0)

## 2021-07-07 LAB — TROPONIN I (HIGH SENSITIVITY)
Troponin I (High Sensitivity): 6 ng/L (ref <18)
Troponin I (High Sensitivity): 6 ng/L (ref <18)

## 2021-07-07 LAB — BRAIN NATRIURETIC PEPTIDE: B Natriuretic Peptide: 118.9 pg/mL — ABNORMAL HIGH (ref 0.0–100.0)

## 2021-07-07 MED ORDER — DORZOLAMIDE HCL-TIMOLOL MAL 2-0.5 % OP SOLN
1.0000 [drp] | Freq: Two times a day (BID) | OPHTHALMIC | Status: DC
  Administered 2021-07-07 – 2021-07-08 (×2): 1 [drp] via OPHTHALMIC
  Filled 2021-07-07: qty 10

## 2021-07-07 MED ORDER — ONDANSETRON HCL 4 MG/2ML IJ SOLN: 4.0000 mg | Freq: Four times a day (QID) | INTRAMUSCULAR | Status: DC | PRN

## 2021-07-07 MED ORDER — ONDANSETRON HCL 4 MG PO TABS: 4.0000 mg | ORAL_TABLET | Freq: Four times a day (QID) | ORAL | Status: DC | PRN

## 2021-07-07 MED ORDER — FLUTICASONE FUROATE-VILANTEROL 100-25 MCG/ACT IN AEPB
1.0000 | INHALATION_SPRAY | Freq: Every day | RESPIRATORY_TRACT | Status: DC
  Administered 2021-07-07: 21:00:00 1 via RESPIRATORY_TRACT
  Filled 2021-07-07: qty 28

## 2021-07-07 MED ORDER — IPRATROPIUM-ALBUTEROL 0.5-2.5 (3) MG/3ML IN SOLN
3.0000 mL | Freq: Four times a day (QID) | RESPIRATORY_TRACT | Status: DC
  Administered 2021-07-07 – 2021-07-08 (×5): 3 mL via RESPIRATORY_TRACT
  Filled 2021-07-07 (×5): qty 3

## 2021-07-07 MED ORDER — ACETAMINOPHEN 325 MG PO TABS: 650.0000 mg | ORAL_TABLET | Freq: Four times a day (QID) | ORAL | Status: DC | PRN

## 2021-07-07 MED ORDER — FLUTICASONE-UMECLIDIN-VILANT 100-62.5-25 MCG/ACT IN AEPB: 1.0000 | INHALATION_SPRAY | Freq: Every day | RESPIRATORY_TRACT | Status: DC

## 2021-07-07 MED ORDER — METOPROLOL TARTRATE 25 MG PO TABS
12.5000 mg | ORAL_TABLET | Freq: Two times a day (BID) | ORAL | Status: DC
  Administered 2021-07-07 – 2021-07-08 (×2): 12.5 mg via ORAL
  Filled 2021-07-07 (×2): qty 1

## 2021-07-07 MED ORDER — LEVOFLOXACIN IN D5W 750 MG/150ML IV SOLN
750.0000 mg | INTRAVENOUS | Status: DC
  Administered 2021-07-07 – 2021-07-08 (×2): 750 mg via INTRAVENOUS
  Filled 2021-07-07 (×2): qty 150

## 2021-07-07 MED ORDER — METOPROLOL TARTRATE 25 MG PO TABS: 12.5000 mg | ORAL_TABLET | Freq: Two times a day (BID) | ORAL | Status: DC

## 2021-07-07 MED ORDER — NICOTINE 14 MG/24HR TD PT24
14.0000 mg | MEDICATED_PATCH | Freq: Every day | TRANSDERMAL | Status: DC
  Administered 2021-07-07 – 2021-07-08 (×2): 14 mg via TRANSDERMAL
  Filled 2021-07-07 (×2): qty 1

## 2021-07-07 MED ORDER — UMECLIDINIUM BROMIDE 62.5 MCG/ACT IN AEPB
1.0000 | INHALATION_SPRAY | Freq: Every day | RESPIRATORY_TRACT | Status: DC
  Administered 2021-07-07: 21:00:00 1 via RESPIRATORY_TRACT
  Filled 2021-07-07: qty 7

## 2021-07-07 MED ORDER — IPRATROPIUM-ALBUTEROL 0.5-2.5 (3) MG/3ML IN SOLN
3.0000 mL | Freq: Once | RESPIRATORY_TRACT | Status: AC
  Administered 2021-07-07: 10:00:00 3 mL via RESPIRATORY_TRACT
  Filled 2021-07-07: qty 3

## 2021-07-07 MED ORDER — METHYLPREDNISOLONE SODIUM SUCC 40 MG IJ SOLR
40.0000 mg | Freq: Two times a day (BID) | INTRAMUSCULAR | Status: DC
  Administered 2021-07-07 – 2021-07-08 (×2): 40 mg via INTRAVENOUS
  Filled 2021-07-07 (×3): qty 1

## 2021-07-07 MED ORDER — HYDROCODONE-ACETAMINOPHEN 5-325 MG PO TABS: 1.0000 | ORAL_TABLET | ORAL | Status: DC | PRN

## 2021-07-07 MED ORDER — LATANOPROST 0.005 % OP SOLN
1.0000 [drp] | Freq: Every day | OPHTHALMIC | Status: DC
  Administered 2021-07-07: 21:00:00 1 [drp] via OPHTHALMIC
  Filled 2021-07-07: qty 2.5

## 2021-07-07 MED ORDER — MORPHINE SULFATE (PF) 2 MG/ML IV SOLN: 2.0000 mg | INTRAVENOUS | Status: DC | PRN

## 2021-07-07 MED ORDER — ALBUTEROL SULFATE (2.5 MG/3ML) 0.083% IN NEBU
2.5000 mg | INHALATION_SOLUTION | Freq: Once | RESPIRATORY_TRACT | Status: AC
  Administered 2021-07-07: 10:00:00 2.5 mg via RESPIRATORY_TRACT
  Filled 2021-07-07: qty 3

## 2021-07-07 MED ORDER — SODIUM CHLORIDE 0.9 % IV BOLUS
500.0000 mL | Freq: Once | INTRAVENOUS | Status: AC
  Administered 2021-07-07: 10:00:00 500 mL via INTRAVENOUS

## 2021-07-07 MED ORDER — GUAIFENESIN ER 600 MG PO TB12
600.0000 mg | ORAL_TABLET | Freq: Two times a day (BID) | ORAL | Status: DC
  Administered 2021-07-07 – 2021-07-08 (×3): 600 mg via ORAL
  Filled 2021-07-07 (×3): qty 1

## 2021-07-07 MED ORDER — ALBUTEROL SULFATE (2.5 MG/3ML) 0.083% IN NEBU: 2.5000 mg | INHALATION_SOLUTION | RESPIRATORY_TRACT | Status: DC | PRN

## 2021-07-07 MED ORDER — ACETAMINOPHEN 650 MG RE SUPP: 650.0000 mg | Freq: Four times a day (QID) | RECTAL | Status: DC | PRN

## 2021-07-07 MED ORDER — PREDNISONE 20 MG PO TABS: 40.0000 mg | ORAL_TABLET | Freq: Every day | ORAL | Status: DC

## 2021-07-07 MED ORDER — ENOXAPARIN SODIUM 40 MG/0.4ML IJ SOSY
40.0000 mg | PREFILLED_SYRINGE | INTRAMUSCULAR | Status: DC
  Administered 2021-07-07 – 2021-07-08 (×2): 40 mg via SUBCUTANEOUS
  Filled 2021-07-07 (×2): qty 0.4

## 2021-07-07 MED ORDER — MAGNESIUM SULFATE 2 GM/50ML IV SOLN
2.0000 g | Freq: Once | INTRAVENOUS | Status: AC
  Administered 2021-07-07: 10:00:00 2 g via INTRAVENOUS
  Filled 2021-07-07: qty 50

## 2021-07-07 NOTE — ED Provider Notes (Signed)
[facility] Emergency Department Provider Note  ____________________________________________   Event Date/Time   First MD Initiated Contact with Patient 07/07/21 0920     (approximate)  I have reviewed the triage vital signs and the nursing notes.   HISTORY  Chief Complaint Shortness of Breath    HPI Carl Bell is a 70 y.o. male  with h/o COPD, HTN, here with SOB.  History is somewhat limited due to increased work of breathing on arrival.  Patient states that over the last several days, he has had progressively worsening shortness of breath and sensation like he could not catch his breath.  He has a history of severe COPD as well as lung cancer.  He states he does not regularly use an inhaler.  He states that he has had increasing shortness of breath over the last several days, with minimal improvement using his home albuterol inhaler.  He has had some increased feeding production.  Denies any fevers or chills.  No nausea or vomiting.  No known sick contacts.  He states that he went out in his home which he believes could have exacerbated his symptoms.  He states he does feel somewhat better after being placed on oxygen and given breathing treatments with EMS.  He is given Solu-Medrol in route as well.  No other complaints.  No chest pain.  No leg swelling.       Past Medical History:  Diagnosis Date   COPD (chronic obstructive pulmonary disease) ([place])    Emphysema of lung ([place])    Glaucoma    Hypertension    Lung cancer [facility])     Patient Active Problem List   Diagnosis Date Noted   COVID 08/04/2020   Demand ischemia ([place]) 08/04/2020   SOB (shortness of breath) 08/04/2020   COPD exacerbation ([place]) 11/08/2019   Acute exacerbation of chronic obstructive pulmonary disease (COPD) ([place]) 10/18/2019   Hypertensive urgency 10/17/2019   COPD with acute exacerbation ([place]) 09/12/2019   Acute on chronic respiratory failure with hypoxia and hypercapnia (HCC)  09/12/2019   Tobacco abuse 09/12/2019   PAD (peripheral artery disease) ([place]) 09/12/2019   HLD (hyperlipidemia) 09/12/2019   HTN (hypertension) 09/12/2019   Atherosclerosis of native arteries of the extremities with ulceration ([place]) 06/28/2019   Sepsis ([place]) 12/24/2017   Protein-calorie malnutrition, severe 11/12/2017   Acute respiratory failure ([place]) 11/10/2017   Acute respiratory distress 12/22/2015   COPD (chronic obstructive pulmonary disease) ([place]) 12/22/2015   Acute bronchitis 12/22/2015   HTN, goal below 140/80 12/22/2015   Lung cancer, lingula ([place]) 09/23/2015   Chronic edema 09/15/2014    Past Surgical History:  Procedure Laterality Date   LOWER EXTREMITY ANGIOGRAPHY Left 07/11/2019   Procedure: LOWER EXTREMITY ANGIOGRAPHY;  Surgeon: Algernon Huxley, MD;  Location: [place] CV LAB;  Service: Cardiovascular;  Laterality: Left;   LUNG BIOPSY     PERIPHERAL VASCULAR CATHETERIZATION N/A 09/24/2015   Procedure: [name];  Surgeon: Algernon Huxley, MD;  Location: [place] CV LAB;  Service: Cardiovascular;  Laterality: N/A;   Port a cath placement      Prior to Admission medications   Medication Sig Start Date End Date Taking? Authorizing Provider  dorzolamide-timolol (COSOPT) 22.3-6.8 MG/ML ophthalmic solution Place 1 drop into both eyes 2 (two) times daily. 07/10/20  Yes [provider]  latanoprost (XALATAN) 0.005 % ophthalmic solution Place 1 drop into both eyes at bedtime. 07/10/20  Yes [provider]  [name] 100-62.5-25  MCG/ACT AEPB Inhale 1 puff into the lungs daily. 05/10/21  Yes [provider]  albuterol (VENTOLIN HFA) 108 (90 Base) MCG/ACT inhaler Inhale 2-4 puffs by mouth every 4 hours as needed for wheezing, cough, and/or shortness of breath 07/08/20   Hinda Kehr, MD    Allergies Patient has no allergy information on record.  Family History  Problem Relation Age of Onset   Diabetes Mellitus II Mother     Hypertension Father     Social History Social History   Tobacco Use   Smoking status: Every Day    Packs/day: 0.50    Years: 50.00    Pack years: 25.00    Types: Cigarettes   Smokeless tobacco: Never  Vaping Use   Vaping Use: Never used  Substance Use Topics   Alcohol use: Yes    Alcohol/week: 0.0 standard drinks    Comment: 3 pints/week   Drug use: No    Review of Systems  Review of Systems  Constitutional:  Positive for fatigue. Negative for chills and fever.  HENT:  Negative for sore throat.   Respiratory:  Positive for cough and shortness of breath.   Cardiovascular:  Negative for chest pain.  Gastrointestinal:  Negative for abdominal pain.  Genitourinary:  Negative for flank pain.  Musculoskeletal:  Negative for neck pain.  Skin:  Negative for rash and wound.  Allergic/Immunologic: Negative for immunocompromised state.  Neurological:  Negative for weakness and numbness.  Hematological:  Does not bruise/bleed easily.  All other systems reviewed and are negative.   ____________________________________________  PHYSICAL EXAM:      VITAL SIGNS: ED Triage Vitals  Enc Vitals Group     BP 07/07/21 0917 (!) 167/115     Pulse Rate 07/07/21 0917 (!) 135     Resp 07/07/21 0917 (!) 22     Temp --      Temp src --      SpO2 07/07/21 0917 (!) 83 %     Weight 07/07/21 0920 150 lb (68 kg)     Height 07/07/21 0920 6\' 1"  (1.854 m)     Head Circumference --      Peak Flow --      Pain Score 07/07/21 0919 0     Pain Loc --      Pain Edu? --      Excl. in [place]? --      Physical Exam Vitals and nursing note reviewed.  Constitutional:      General: He is not in acute distress.    Appearance: He is well-developed.  HENT:     Head: Normocephalic and atraumatic.  Eyes:     Conjunctiva/sclera: Conjunctivae normal.  Cardiovascular:     Rate and Rhythm: Normal rate and regular rhythm.     Heart sounds: Normal heart sounds. No murmur heard.   No friction rub.   Pulmonary:     Effort: Tachypnea and respiratory distress present.     Breath sounds: Examination of the right-upper field reveals wheezing. Examination of the left-upper field reveals wheezing. Examination of the right-middle field reveals wheezing. Examination of the left-middle field reveals wheezing. Examination of the right-lower field reveals wheezing. Examination of the left-lower field reveals wheezing. Decreased breath sounds and wheezing present.  Abdominal:     General: There is no distension.     Palpations: Abdomen is soft.     Tenderness: There is no abdominal tenderness.  Musculoskeletal:     Cervical back: Neck supple.  Skin:  General: Skin is warm.     Capillary Refill: Capillary refill takes less than 2 seconds.  Neurological:     Mental Status: He is alert and oriented to person, place, and time.     Motor: No abnormal muscle tone.      ____________________________________________   LABS (all labs ordered are listed, but only abnormal results are displayed)  Labs Reviewed  BLOOD GAS, VENOUS - Abnormal; Notable for the following components:      Result Value   pCO2, Ven 61 (*)    Bicarbonate 29.3 (*)    All other components within normal limits  CBC WITH DIFFERENTIAL/PLATELET - Abnormal; Notable for the following components:   RBC 4.20 (*)    Hemoglobin 10.4 (*)    HCT 33.7 (*)    MCH 24.8 (*)    RDW 16.4 (*)    Platelets 114 (*)    Lymphs Abs 0.5 (*)    All other components within normal limits  COMPREHENSIVE METABOLIC PANEL - Abnormal; Notable for the following components:   Sodium 133 (*)    Glucose, Bld 123 (*)    Creatinine, Ser 0.54 (*)    Total Bilirubin 1.6 (*)    All other components within normal limits  BRAIN NATRIURETIC PEPTIDE - Abnormal; Notable for the following components:   B Natriuretic Peptide 118.9 (*)    All other components within normal limits  RESP PANEL BY RT-PCR (FLU A&B, COVID) ARPGX2  RESPIRATORY PANEL BY PCR   EXPECTORATED SPUTUM ASSESSMENT W GRAM STAIN, RFLX TO RESP C  BASIC METABOLIC PANEL  CBC  LIPID PANEL  TROPONIN I (HIGH SENSITIVITY)  TROPONIN I (HIGH SENSITIVITY)    ____________________________________________   ________________________________________  RADIOLOGY All imaging, including plain films, CT scans, and ultrasounds, independently reviewed by me, and interpretations confirmed via formal radiology reads.  ED MD interpretation:   CXR: COPD, chronic changes  Official radiology report(s): DG Chest Portable 1 View  Result Date: 07/07/2021 CLINICAL DATA:  Shortness of breath EXAM: PORTABLE CHEST 1 VIEW COMPARISON:  08/03/2020 FINDINGS: Right Port-A-Cath remains in place with the tip in the SVC, unchanged. Heart is normal size. Linear scarring in the lingula. No acute confluent airspace opacities or effusions. There is hyperinflation of the lungs compatible with COPD. IMPRESSION: COPD/chronic changes. No active disease. Electronically Signed   By: Rolm Baptise M.D.   On: 07/07/2021 09:48    ____________________________________________  PROCEDURES   Procedure(s) performed (including Critical Care):  .Critical Care Performed by: Duffy Bruce, MD Authorized by: Duffy Bruce, MD   Critical care provider statement:    Critical care time (minutes):  30   Critical care time was exclusive of:  Separately billable procedures and treating other patients   Critical care was necessary to treat or prevent imminent or life-threatening deterioration of the following conditions:  Cardiac failure, circulatory failure and respiratory failure   Critical care was time spent personally by me on the following activities:  Development of treatment plan with patient or surrogate, discussions with consultants, evaluation of patient's response to treatment, examination of patient, ordering and review of laboratory studies, ordering and review of radiographic studies, ordering and performing  treatments and interventions, pulse oximetry, re-evaluation of patient's condition and review of old charts  ____________________________________________  INITIAL IMPRESSION / MDM / [place] / ED COURSE  As part of my medical decision making, I reviewed the following data within the [facility] notes reviewed and incorporated, Old chart reviewed, Notes  from prior ED visits, and [place] Controlled Substance Database       *ASHAAD GAERTNER was evaluated in Emergency Department on 07/07/2021 for the symptoms described in the history of present illness. He was evaluated in the context of the global COVID-19 pandemic, which necessitated consideration that the patient might be at risk for infection with the SARS-CoV-2 virus that causes COVID-19. Institutional protocols and algorithms that pertain to the evaluation of patients at risk for COVID-19 are in a state of rapid change based on information released by regulatory bodies including the CDC and federal and state organizations. These policies and algorithms were followed during the patient's care in the ED.  Some ED evaluations and interventions may be delayed as a result of limited staffing during the pandemic.*     Medical Decision Making:  70 yo M with PMHx as above including severe COPD, h/o lung CA here with SOB. Pt arrives in apparent resp distress with pursed-lip breathing, tachypnea. Diffuse wheezes and markedly diminished aeration noted. CXR reviewed, shows no PNA or PTX. Labs reviewed, overall reassuring. Trop neg, EKG nonischemic and BNP minimally elevated - do not suspect ACS, CHF, cardiac etiology. CBC without leukocytosis. CMP unremarkable. COVID, flu negative. Pt given duonebs x 3, IV solumedrol, IV mag 2g for bronchospasm. Pt had significant improvement with this but remains hypoxic, requiring 2L [place]. WOB improving and pt breathing more comfortably. Will admit to  medicine.  ____________________________________________  FINAL CLINICAL IMPRESSION(S) / ED DIAGNOSES  Final diagnoses:  COPD exacerbation ([place])  Shortness of breath  Acute respiratory failure with hypoxia ([place])     MEDICATIONS GIVEN DURING THIS VISIT:  Medications  levofloxacin (LEVAQUIN) IVPB 750 mg (0 mg Intravenous Stopped 07/07/21 1531)  methylPREDNISolone sodium succinate (SOLU-MEDROL) 40 mg/mL injection 40 mg (40 mg Intravenous Given 07/07/21 1406)    Followed by  predniSONE (DELTASONE) tablet 40 mg (has no administration in time range)  ipratropium-albuterol (DUONEB) 0.5-2.5 (3) MG/3ML nebulizer solution 3 mL (3 mLs Nebulization Given 07/07/21 1409)  albuterol (PROVENTIL) (2.5 MG/3ML) 0.083% nebulizer solution 2.5 mg (has no administration in time range)  enoxaparin (LOVENOX) injection 40 mg (40 mg Subcutaneous Given 07/07/21 1410)  acetaminophen (TYLENOL) tablet 650 mg (has no administration in time range)    Or  acetaminophen (TYLENOL) suppository 650 mg (has no administration in time range)  HYDROcodone-acetaminophen (NORCO/VICODIN) 5-325 MG per tablet 1-2 tablet (has no administration in time range)  morphine 2 MG/ML injection 2 mg (has no administration in time range)  ondansetron (ZOFRAN) tablet 4 mg (has no administration in time range)    Or  ondansetron (ZOFRAN) injection 4 mg (has no administration in time range)  nicotine (NICODERM CQ - dosed in mg/24 hours) patch 14 mg (14 mg Transdermal Patch Applied 07/07/21 1404)  guaiFENesin (MUCINEX) 12 hr tablet 600 mg (600 mg Oral Given 07/07/21 1408)  dorzolamide-timolol (COSOPT) 22.3-6.8 MG/ML ophthalmic solution 1 drop (has no administration in time range)  latanoprost (XALATAN) 0.005 % ophthalmic solution 1 drop (has no administration in time range)  Fluticasone-Umeclidin-Vilant 100-62.5-25 MCG/ACT AEPB 1 puff (has no administration in time range)  metoprolol tartrate (LOPRESSOR) tablet 12.5 mg (has no administration  in time range)  albuterol (PROVENTIL) (2.5 MG/3ML) 0.083% nebulizer solution 2.5 mg (2.5 mg Nebulization Given 07/07/21 0940)  albuterol (PROVENTIL) (2.5 MG/3ML) 0.083% nebulizer solution 2.5 mg (2.5 mg Nebulization Given 07/07/21 0940)  ipratropium-albuterol (DUONEB) 0.5-2.5 (3) MG/3ML nebulizer solution 3 mL (3 mLs Nebulization Given 07/07/21 0940)  magnesium sulfate IVPB 2 g  50 mL (0 g Intravenous Stopped 07/07/21 0959)  sodium chloride 0.9 % bolus 500 mL (0 mLs Intravenous Stopped 07/07/21 1022)     ED Discharge Orders     None        Note:  This document was prepared using Dragon voice recognition software and may include unintentional dictation errors.   Duffy Bruce, MD 07/07/21 [phone]

## 2021-07-07 NOTE — H&P (Signed)
H&P:    Carl Bell   YJE:[id number] DOB: 12/24/1950 DOA: 07/07/2021  PCP: Kirk Ruths, MD  Chief Complaint: Shortness of breath   History of Present Illness:    HPI: Carl Bell is a 70 y.o. male with a past medical history of COPD, hypertension, history of lung cancer, tobacco dependence, hyperlipidemia.  Patient presents from home due to worsening shortness of breath.  Not on home oxygen at baseline.  Was tachypneic and hypoxic initially and plan was to place on BiPAP but after several rounds of breathing treatments in route via EMS and in the emergency department as well as IV steroids he is now much more comfortable and on 2 L and saturating well.  Wife is present at bedside.  Endorses purulent cough. Still smoking about 1/2 packs/day.  No fevers or chills.  No chest pain. No abdominal pain, nausea vomiting or diarrhea.  ED Course: Proventil neb x2, DuoNeb x1, magnesium sulfate 2 mg IV, NS 500 cc bolus was administered.  Labs show baseline anemia.  Chest x-ray shows no infiltrate or consolidation.  Venous blood gas shows a PCO2 of 61.  Blood glucose 123 and sodium 133.    ROS:   14 point review of systems is negative except for what is mentioned above in the HPI.   Past Medical History:   Past Medical History:  Diagnosis Date   COPD (chronic obstructive pulmonary disease) ([place])    Emphysema of lung ([place])    Glaucoma    Hypertension    Lung cancer ([place])     Past Surgical History:   Past Surgical History:  Procedure Laterality Date   LOWER EXTREMITY ANGIOGRAPHY Left 07/11/2019   Procedure: LOWER EXTREMITY ANGIOGRAPHY;  Surgeon: Algernon Huxley, MD;  Location: [place] CV LAB;  Service: Cardiovascular;  Laterality: Left;   LUNG BIOPSY     PERIPHERAL VASCULAR CATHETERIZATION N/A 09/24/2015   Procedure: [name];  Surgeon: Algernon Huxley, MD;  Location: [place] CV LAB;  Service: Cardiovascular;  Laterality: N/A;   Port a cath placement       Social History:   Social History   Socioeconomic History   Marital status: Legally Separated    Spouse name: Not on file   Number of children: Not on file   Years of education: Not on file   Highest education level: Not on file  Occupational History   Not on file  Tobacco Use   Smoking status: Every Day    Packs/day: 0.50    Years: 50.00    Pack years: 25.00    Types: Cigarettes   Smokeless tobacco: Never  Vaping Use   Vaping Use: Never used  Substance and Sexual Activity   Alcohol use: Yes    Alcohol/week: 0.0 standard drinks    Comment: 3 pints/week   Drug use: No   Sexual activity: Not on file  Other Topics Concern   Not on file  Social History Narrative   Not on file   Social Determinants of Health   Financial Resource Strain: Not on file  Food Insecurity: Not on file  Transportation Needs: Not on file  Physical Activity: Not on file  Stress: Not on file  Social Connections: Not on file  Intimate Partner Violence: Not on file    Allergies:   Not on File  Family History:   Family History  Problem Relation Age of Onset   Diabetes Mellitus II Mother    Hypertension  Father      Current Medications:   Prior to Admission medications   Medication Sig Start Date End Date Taking? Authorizing Provider  albuterol (VENTOLIN HFA) 108 (90 Base) MCG/ACT inhaler Inhale 2-4 puffs by mouth every 4 hours as needed for wheezing, cough, and/or shortness of breath 07/08/20   Hinda Kehr, MD  aspirin EC 81 MG EC tablet Take 1 tablet (81 mg total) by mouth daily. Swallow whole. 08/09/20   Ezekiel Slocumb, DO  atorvastatin (LIPITOR) 10 MG tablet Take 1 tablet (10 mg total) by mouth daily. 08/09/20   Nicole Kindred A, DO  dorzolamide-timolol (COSOPT) 22.3-6.8 MG/ML ophthalmic solution Place 1 drop into both eyes 2 (two) times daily. 07/10/20   [provider]  guaiFENesin-dextromethorphan (ROBITUSSIN DM) 100-10 MG/5ML syrup Take 5 mLs by mouth every 4  (four) hours as needed for cough. 08/08/20   Ezekiel Slocumb, DO  latanoprost (XALATAN) 0.005 % ophthalmic solution Place 1 drop into both eyes at bedtime. 07/10/20   [provider]  metoprolol tartrate (LOPRESSOR) 25 MG tablet Take 0.5 tablets (12.5 mg total) by mouth 2 (two) times daily. 08/08/20   Ezekiel Slocumb, DO     Physical Exam:   Vitals:   07/07/21 0920 07/07/21 0947 07/07/21 1000 07/07/21 1030  BP:   127/83 (!) 164/89  Pulse:   92 98  Resp:   19 20  Temp:  97.9 F (36.6 C)    TempSrc:  Axillary    SpO2:   100% 100%  Weight: 68 kg     Height: 6\' 1"  (1.854 m)        General:  Appears calm and comfortable and is in NAD Cardiovascular:  RRR, no m/r/g.  Respiratory:   Diffuse wheezing throughout that is moderate Abdomen:  soft, NT, ND, NABS Skin:  no rash or induration seen on limited exam Musculoskeletal:  grossly normal tone BUE/BLE, good ROM, no bony abnormality Lower extremity:  No LE edema.  Limited foot exam with no ulcerations.  2+ distal pulses. Psychiatric:  grossly normal mood and affect, speech fluent and appropriate, AOx3 Neurologic:  CN 2-12 grossly intact, moves all extremities in coordinated fashion, sensation intact    Data Review:    Radiological Exams on Admission: Independently reviewed - see discussion in A/P where applicable  DG Chest Portable 1 View  Result Date: 07/07/2021 CLINICAL DATA:  Shortness of breath EXAM: PORTABLE CHEST 1 VIEW COMPARISON:  08/03/2020 FINDINGS: Right Port-A-Cath remains in place with the tip in the SVC, unchanged. Heart is normal size. Linear scarring in the lingula. No acute confluent airspace opacities or effusions. There is hyperinflation of the lungs compatible with COPD. IMPRESSION: COPD/chronic changes. No active disease. Electronically Signed   By: Rolm Baptise M.D.   On: 07/07/2021 09:48     Labs on Admission: I have personally reviewed the available labs and imaging studies at the time of the  admission.  Pertinent labs on Admission: Sodium 133, blood glucose 123, hemoglobin 10, hematocrit 33, platelets 114     Assessment/Plan:    Acute hypoxic/hypercapnic respiratory failure secondary to COPD exacerbation: Start IV steroids.  Start bronchodilators with scheduled DuoNebs. Wean oxygen as tolerated. Currently on 2L [place]. Start Mucinex for cough.  Start Levaquin. Obtain full RVP and sputum culture. COVID-19, influenza A/B, RSV PCR negative. Continue home Trelegy inhaler. Hold home Ventolin HFA inhaler.  Tobacco dependence: Currently smokes 1/2 packs/day.  Counseled on smoking cessation.  Start nicotine patch daily.  Hypertension: No  longer taking his home Lopressor. We will resume this medication again for him.  Hyperlipidemia: No longer taking his home Lipitor. We will check a lipid panel.  History of lung cancer: Follows with local oncology and currently in remission.   Other information:    Level of Care: Med telemetry DVT prophylaxis: Lovenox subcu Code Status: Full code Consults: None Admission status: Inpatient   Leslee Home DO [facility]   How to contact the [facility] Attending or Consulting provider [place] or covering provider during after hours [place], for this patient?  Check the care team in [facility] and look for a) attending/consulting TRH provider listed and b) the [facility] team listed Log into [email or web address] and use [place]'s universal password to access. If you do not have the password, please contact the hospital operator. Locate the [facility] provider you are looking for under [facility] and page to a number that you can be directly reached. If you still have difficulty reaching the provider, please page the [facility] (Director on Call) for the Hospitalists listed on amion for assistance.   07/07/2021, 11:40 AM

## 2021-07-07 NOTE — ED Triage Notes (Signed)
BIB [facility] from home. NO heat on house. Pt has COPD HX. Pt is coughing and wheezing thats worsened over last few days.  5 albuterol 572mcg atrovent 125mg  of solumedrol 20G RFA.

## 2021-07-07 NOTE — Progress Notes (Signed)
°   07/07/21 1533  Assess: MEWS Score  Temp 98.8 F (37.1 C)  BP 132/80  Pulse Rate (!) 116  Resp 16  SpO2 100 %  O2 Device Nasal Cannula  O2 Flow Rate (L/min) 4 L/min  Assess: MEWS Score  MEWS Temp 0  MEWS Systolic 0  MEWS Pulse 2  MEWS RR 0  MEWS LOC 0  MEWS Score 2  MEWS Score Color Yellow  Assess: if the MEWS score is Yellow or Red  Were vital signs taken at a resting state? Yes  Focused Assessment Change from prior assessment (see assessment flowsheet)  Does the patient meet 2 or more of the SIRS criteria? No  MEWS guidelines implemented *See Row Information* Yes  Take Vital Signs  Increase Vital Sign Frequency  Yellow: Q 2hr X 2 then Q 4hr X 2, if remains yellow, continue Q 4hrs  Escalate  MEWS: Escalate Yellow: discuss with charge nurse/RN and consider discussing with provider and RRT  Notify: Charge Nurse/RN  Name of Charge Nurse/RN Notified [place] RN  Date Charge Nurse/RN Notified 07/07/21  Time Charge Nurse/RN Notified [id number]  Notify: Provider  Provider Name/Title dr Bonnee Quin  Date Provider Notified 07/07/21  Time Provider Notified 1535  Notification Type  (secure chat)  Notification Reason Change in status  Provider response See new orders  Date of Provider Response 07/07/21  Time of Provider Response 1536

## 2021-07-08 ENCOUNTER — Encounter: Payer: Self-pay | Admitting: Family Medicine

## 2021-07-08 DIAGNOSIS — J441 Chronic obstructive pulmonary disease with (acute) exacerbation: Secondary | ICD-10-CM

## 2021-07-08 LAB — RESPIRATORY PANEL BY PCR

## 2021-07-08 LAB — CBC
HCT: 30.1 % — ABNORMAL LOW (ref 39.0–52.0)
Hemoglobin: 9.7 g/dL — ABNORMAL LOW (ref 13.0–17.0)
MCH: 24.5 pg — ABNORMAL LOW (ref 26.0–34.0)
MCHC: 32.2 g/dL (ref 30.0–36.0)
MCV: 76 fL — ABNORMAL LOW (ref 80.0–100.0)
Platelets: 133 10*3/uL — ABNORMAL LOW (ref 150–400)
RBC: 3.96 MIL/uL — ABNORMAL LOW (ref 4.22–5.81)
RDW: 15.6 % — ABNORMAL HIGH (ref 11.5–15.5)
WBC: 6.2 10*3/uL (ref 4.0–10.5)
nRBC: 0 % (ref 0.0–0.2)

## 2021-07-08 LAB — LIPID PANEL
Cholesterol: 116 mg/dL (ref 0–200)
HDL: 64 mg/dL (ref >40)
LDL Cholesterol: 46 mg/dL (ref 0–99)
Total CHOL/HDL Ratio: 1.8 RATIO
Triglycerides: 30 mg/dL (ref <150)
VLDL: 6 mg/dL (ref 0–40)

## 2021-07-08 LAB — BASIC METABOLIC PANEL
Anion gap: 6 (ref 5–15)
BUN: 20 mg/dL (ref 8–23)
CO2: 27 mmol/L (ref 22–32)
Calcium: 8.8 mg/dL — ABNORMAL LOW (ref 8.9–10.3)
Chloride: 102 mmol/L (ref 98–111)
Creatinine, Ser: 0.98 mg/dL (ref 0.61–1.24)
GFR, Estimated: >60 mL/min (ref >60)
Glucose, Bld: 110 mg/dL — ABNORMAL HIGH (ref 70–99)
Potassium: 4.9 mmol/L (ref 3.5–5.1)
Sodium: 135 mmol/L (ref 135–145)

## 2021-07-08 LAB — PROCALCITONIN: Procalcitonin: <0.1 ng/mL

## 2021-07-08 MED ORDER — DM-GUAIFENESIN ER 30-600 MG PO TB12: 1.0000 | ORAL_TABLET | Freq: Two times a day (BID) | ORAL | 0 refills | Status: DC

## 2021-07-08 MED ORDER — LEVOFLOXACIN 750 MG PO TABS: 750.0000 mg | ORAL_TABLET | Freq: Every day | ORAL | Status: DC

## 2021-07-08 MED ORDER — AZITHROMYCIN 250 MG PO TABS: ORAL_TABLET | ORAL | 0 refills | Status: AC

## 2021-07-08 MED ORDER — METOPROLOL TARTRATE 25 MG PO TABS: 12.5000 mg | ORAL_TABLET | Freq: Two times a day (BID) | ORAL | 1 refills | Status: DC

## 2021-07-08 MED ORDER — PREDNISONE 20 MG PO TABS
40.0000 mg | ORAL_TABLET | Freq: Every day | ORAL | 0 refills | Status: AC
Start: 2021-07-09 — End: 2021-07-14

## 2021-07-08 MED ORDER — IPRATROPIUM-ALBUTEROL 0.5-2.5 (3) MG/3ML IN SOLN: 3.0000 mL | Freq: Four times a day (QID) | RESPIRATORY_TRACT | 0 refills | Status: DC | PRN

## 2021-07-08 NOTE — TOC Progression Note (Signed)
Transition of Care [facility]) - Progression Note    Patient Details  Name: Carl Bell MRN: [id number] Date of Birth: Jan 25, 1951  Transition of [facility]) CM/SW [place], RN Phone Number: 07/08/2021, 1:35 PM  Clinical Narrative:   Patient is blind.  Explained code 36 to him, he verbalized understanding of code 47.         Expected Discharge Plan and Services           Expected Discharge Date: 07/08/21                                     Social Determinants of Health (SDOH) Interventions    Readmission Risk Interventions No flowsheet data found.

## 2021-07-08 NOTE — Progress Notes (Signed)
PHARMACIST - PHYSICIAN COMMUNICATION DR: Dr. Reesa Chew CONCERNING: Antibiotic IV to Oral Route Change Policy  RECOMMENDATION: This patient is receiving levofloxacin 750mg  IV every 24 hours by the intravenous route.  Based on criteria approved by the Pharmacy and Therapeutics Committee, the antibiotic(s) is/are being converted to the equivalent oral dose form(s).  DESCRIPTION: These criteria include: Patient being treated for a respiratory tract infection, urinary tract infection, cellulitis or clostridium difficile associated diarrhea if on metronidazole The patient is not neutropenic and does not exhibit a GI malabsorption state The patient is eating (either orally or via tube) and/or has been taking other orally administered medications for a least 24 hours The patient is improving clinically and has a Tmax < 100.5  If you have questions about this conversion, please contact the Pharmacy Department  [x]   [phone])  [place], PharmD, [place] PGPM Clinical Pharmacist 07/08/2021 2:11 PM

## 2021-07-08 NOTE — TOC Initial Note (Addendum)
Transition of Care [facility]) - Initial/Assessment Note    Patient Details  Name: Carl Bell MRN: [id number] Date of Birth: 16-Apr-1951  [facility]) CM/SW Contact:    Pete Pelt, RN Phone Number: 07/08/2021, 1:58 PM  Clinical Narrative:   Patient lives at home with mother, has support of his sister, and son and daughter, who provide transportation and assistance with him and his mother.  Patient became short of breath during this assessment, RN contacted, at bedside oxygen in place.               Addendum:  RN took O2 sats, patient qualified for home oxygen.  Discussed with patient, [employer] notified of home oxygen requirements.  [employer] also notified that patient cannot see and to adjust education as appropriate.  Expected Discharge Plan: Home/Self Care Barriers to Discharge: Continued Medical Work up   Patient Goals and CMS Choice     Choice offered to / list presented to : NA  Expected Discharge Plan and Services Expected Discharge Plan: Home/Self Care   Discharge Planning Services: CM Consult   Living arrangements for the past 2 months: Single Family Home Expected Discharge Date: 07/08/21                                    Prior Living Arrangements/Services Living arrangements for the past 2 months: Single Family Home Lives with:: Self, Parents Patient language and need for interpreter reviewed:: Yes (No interpreter required) Do you feel safe going back to the place where you live?: Yes      Need for Family Participation in Patient Care: Yes (Comment) Care giver support system in place?: Yes (comment)   Criminal Activity/Legal Involvement Pertinent to Current Situation/Hospitalization: No - Comment as needed  Activities of Daily Living   ADL Screening (condition at time of admission) Patient's cognitive ability adequate to safely complete daily activities?: No Is the patient deaf or have difficulty hearing?: No Does the patient have difficulty  seeing, even when wearing glasses/contacts?: Yes (blind) Does the patient have difficulty concentrating, remembering, or making decisions?: No Patient able to express need for assistance with ADLs?: Yes Does the patient have difficulty dressing or bathing?: No Independently performs ADLs?: No Does the patient have difficulty walking or climbing stairs?: Yes Weakness of Legs: Both Weakness of Arms/Hands: None  Permission Sought/Granted Permission sought to share information with : Case Manager Permission granted to share information with : Yes, Verbal Permission Granted              Emotional Assessment Appearance:: Appears stated age Attitude/Demeanor/Rapport: Gracious, Engaged Affect (typically observed): Pleasant Orientation: : Oriented to  Time, Oriented to Situation, Oriented to Place, Oriented to Self Alcohol / Substance Use: Not Applicable Psych Involvement: No (comment)  Admission diagnosis:  COPD with acute exacerbation ([place]) [J44.1] Patient Active Problem List   Diagnosis Date Noted   COVID 08/04/2020   Demand ischemia ([place]) 08/04/2020   SOB (shortness of breath) 08/04/2020   COPD exacerbation ([place]) 11/08/2019   Acute exacerbation of chronic obstructive pulmonary disease (COPD) ([place]) 10/18/2019   Hypertensive urgency 10/17/2019   COPD with acute exacerbation ([place]) 09/12/2019   Acute on chronic respiratory failure with hypoxia and hypercapnia ([place]) 09/12/2019   Tobacco abuse 09/12/2019   PAD (peripheral artery disease) ([place]) 09/12/2019   HLD (hyperlipidemia) 09/12/2019   HTN (hypertension) 09/12/2019   Atherosclerosis of native arteries of the extremities  with ulceration ([place]) 06/28/2019   Sepsis ([place]) 12/24/2017   Protein-calorie malnutrition, severe 11/12/2017   Acute respiratory failure ([place]) 11/10/2017   Acute respiratory distress 12/22/2015   COPD (chronic obstructive pulmonary disease) ([place]) 12/22/2015   Acute bronchitis 12/22/2015   HTN, goal below 140/80  12/22/2015   Lung cancer, lingula ([place]) 09/23/2015   Chronic edema 09/15/2014   PCP:  Kirk Ruths, MD Pharmacy:   [facility], [place] [address] [place] [address] Phone: [phone] Fax: [phone]     Social Determinants of Health (SDOH) Interventions    Readmission Risk Interventions No flowsheet data found.

## 2021-07-08 NOTE — Progress Notes (Signed)
SATURATION QUALIFICATIONS: (This note is used to comply with regulatory documentation for home oxygen)  Patient Saturations on Room Air at Rest = 98 %  Patient Saturations on Room Air while Ambulating = 77%  Patient Saturations on 3 Liters of oxygen while Ambulating = 96%  Please briefly explain why patient needs home oxygen:

## 2021-07-08 NOTE — Progress Notes (Signed)
Oxygen concentrator and portable oxygen tank handed over to the family

## 2021-07-08 NOTE — Care Management CC44 (Signed)
Condition Code 44 Documentation Completed  Patient Details  Name: Carl Bell MRN: [id number] Date of Birth: 12/31/1950   Condition Code 44 given:  yes  Patient signature on Condition Code 44 notice:   yes Documentation of 2 MD's agreement:   yes Code 44 added to claim:  yes     Pete Pelt, RN 07/08/2021, 1:31 PM

## 2021-07-08 NOTE — Discharge Summary (Addendum)
Physician Discharge Summary  Carl Bell HUT:[id number] DOB: 07/04/1951 DOA: 07/07/2021  PCP: Kirk Ruths, MD  Admit date: 07/07/2021 Discharge date: 07/08/2021  Admitted From: Home Disposition: Home  Recommendations for Outpatient Follow-up:  Follow up with PCP in 1-2 weeks Please obtain BMP/CBC in one week Please follow up on the following pending results: None  Home Health: No Equipment/Devices: Discharge Condition: Stable CODE STATUS: Full Diet recommendation: Heart Healthy   Brief/Interim Summary:  Carl Bell is a 70 y.o. male with a past medical history of COPD, hypertension, history of lung cancer, tobacco dependence, hyperlipidemia,presents from home due to worsening shortness of breath.  Not on home oxygen at baseline.  Was tachypneic and hypoxic initially and plan was to place on BiPAP but after several rounds of breathing treatments in route via EMS and in the emergency department as well as IV steroids he is now much more comfortable and on 2 L and saturating well.  On admission.  COVID-19 PCR and influenza was negative.  Respiratory viral panel positive for rhinovirus.  Patient improved significantly and was able to wean off from oxygen pretty quickly. Next morning he was at his baseline, no wheezing.  Procalcitonin was negative and chest x-ray with no infiltrate or consolidation.  He was discharged home on prednisone, Zithromax and DuoNeb treatments.  He will continue with his inhalers and follow-up with his primary care provider.  Patient was later desaturating with ambulation requiring up to 3 L of oxygen.  He was discharged home on 3 L of oxygen to be used all the time and follow-up with his pulmonologist for further recommendations.  Patient was having elevated blood pressure, used to take Lopressor at home which he stopped using for some time.  We restarted Lopressor and his PCP can titrate or add an agent if needed.  He will continue the rest of his home  medications and follow-up with his providers.    Discharge Diagnoses:  Principal Problem:   COPD with acute exacerbation [facility])   Discharge Instructions  Discharge Instructions     Diet - low sodium heart healthy   Complete by: As directed    Discharge instructions   Complete by: As directed    It was pleasure taking care of you. You are being given zithromax and prednisone for 5 days. Please take it as directed. You can use your duoneb every 6 hours for next 2 days and then only as needed. Continue taking your inhalers. Keep yourself well hydrated. Follow up with your doctor for further Management.   Increase activity slowly   Complete by: As directed       Allergies as of 07/08/2021   No Known Allergies      Medication List     TAKE these medications    albuterol 108 (90 Base) MCG/ACT inhaler Commonly known as: VENTOLIN HFA Inhale 2-4 puffs by mouth every 4 hours as needed for wheezing, cough, and/or shortness of breath   azithromycin 250 MG tablet Commonly known as: Zithromax Z-Pak Take 2 tablets (500 mg) on  Day 1,  followed by 1 tablet (250 mg) once daily on Days 2 through 5.   dextromethorphan-guaiFENesin 30-600 MG 12hr tablet Commonly known as: MUCINEX DM Take 1 tablet by mouth 2 (two) times daily.   dorzolamide-timolol 22.3-6.8 MG/ML ophthalmic solution Commonly known as: COSOPT Place 1 drop into both eyes 2 (two) times daily.   ipratropium-albuterol 0.5-2.5 (3) MG/3ML Soln Commonly known as: DUONEB Take 3 mLs by nebulization  every 6 (six) hours as needed (Shortness of breath).   latanoprost 0.005 % ophthalmic solution Commonly known as: XALATAN Place 1 drop into both eyes at bedtime.   metoprolol tartrate 25 MG tablet Commonly known as: LOPRESSOR Take 0.5 tablets (12.5 mg total) by mouth 2 (two) times daily.   predniSONE 20 MG tablet Commonly known as: DELTASONE Take 2 tablets (40 mg total) by mouth daily with breakfast for 5 days. Start  taking on: July 09, 2021   Trelegy Ellipta 100-62.5-25 MCG/ACT Aepb Generic drug: Fluticasone-Umeclidin-Vilant Inhale 1 puff into the lungs daily.        Follow-up Information     Kirk Ruths, MD. Schedule an appointment as soon as possible for a visit in 1 week(s).   Specialty: Internal Medicine Contact information: [address] [place] [address] [phone]                No Known Allergies  Consultations: None  Procedures/Studies: DG Chest Portable 1 View  Result Date: 07/07/2021 CLINICAL DATA:  Shortness of breath EXAM: PORTABLE CHEST 1 VIEW COMPARISON:  08/03/2020 FINDINGS: Right Port-A-Cath remains in place with the tip in the SVC, unchanged. Heart is normal size. Linear scarring in the lingula. No acute confluent airspace opacities or effusions. There is hyperinflation of the lungs compatible with COPD. IMPRESSION: COPD/chronic changes. No active disease. Electronically Signed   By: Rolm Baptise M.D.   On: 07/07/2021 09:48    Subjective: Patient was seen and examined today.  Significantly improved, he thinks that he is at his baseline now.  Saturating well on room air.  Still having some cough.  Patient does not want any home health services stating that he can manage.  Technically blind and lives with his mother.  He is in the process of switching PCP due to change in insurance, had an appointment with new PCP next week.  He had a nebulizer machine at home but no medications.  Discharge Exam: Vitals:   07/08/21 0819 07/08/21 1100  BP: 132/82   Pulse: 93   Resp: 18   Temp: 98.4 F (36.9 C)   SpO2: 100% 96%   Vitals:   07/08/21 0239 07/08/21 0814 07/08/21 0819 07/08/21 1100  BP:   132/82   Pulse:  95 93   Resp:  16 18   Temp:   98.4 F (36.9 C)   TempSrc:   Oral   SpO2: 100% 100% 100% 96%  Weight:      Height:        General: Pt is alert, awake, not in acute distress Cardiovascular: RRR, S1/S2 +,  no rubs, no gallops Respiratory: CTA bilaterally, no wheezing, no rhonchi Abdominal: Soft, NT, ND, bowel sounds + Extremities: no edema, no cyanosis   The results of significant diagnostics from this hospitalization (including imaging, microbiology, ancillary and laboratory) are listed below for reference.    Microbiology: Recent Results (from the past 240 hour(s))  Resp Panel by RT-PCR (Flu A&B, Covid) Nasopharyngeal Swab     Status: None   Collection Time: 07/07/21  9:25 AM   Specimen: Nasopharyngeal Swab; Nasopharyngeal(NP) swabs in vial transport medium  Result Value Ref Range Status   SARS Coronavirus 2 by RT PCR NEGATIVE NEGATIVE Final    Comment: (NOTE) SARS-CoV-2 target nucleic acids are NOT DETECTED.  The SARS-CoV-2 RNA is generally detectable in upper respiratory specimens during the acute phase of infection. The lowest concentration of SARS-CoV-2 viral copies this assay  can detect is 138 copies/mL. A negative result does not preclude SARS-Cov-2 infection and should not be used as the sole basis for treatment or other patient management decisions. A negative result may occur with  improper specimen collection/handling, submission of specimen other than nasopharyngeal swab, presence of viral mutation(s) within the areas targeted by this assay, and inadequate number of viral copies(<138 copies/mL). A negative result must be combined with clinical observations, patient history, and epidemiological information. The expected result is Negative.  Fact Sheet for Patients:  EntrepreneurPulse.com.au  Fact Sheet for Healthcare Providers:  IncredibleEmployment.be  This test is no t yet approved or cleared by the [place] FDA and  has been authorized for detection and/or diagnosis of SARS-CoV-2 by FDA under an Emergency Use Authorization (EUA). This EUA will remain  in effect (meaning this test can be used) for the duration of  the COVID-19 declaration under Section 564(b)(1) of the Act, 21 U.S.C.section 360bbb-3(b)(1), unless the authorization is terminated  or revoked sooner.       Influenza A by PCR NEGATIVE NEGATIVE Final   Influenza B by PCR NEGATIVE NEGATIVE Final    Comment: (NOTE) The Xpert Xpress SARS-CoV-2/FLU/RSV plus assay is intended as an aid in the diagnosis of influenza from Nasopharyngeal swab specimens and should not be used as a sole basis for treatment. Nasal washings and aspirates are unacceptable for Xpert Xpress SARS-CoV-2/FLU/RSV testing.  Fact Sheet for Patients: EntrepreneurPulse.com.au  Fact Sheet for Healthcare Providers: IncredibleEmployment.be  This test is not yet approved or cleared by the [place] FDA and has been authorized for detection and/or diagnosis of SARS-CoV-2 by FDA under an Emergency Use Authorization (EUA). This EUA will remain in effect (meaning this test can be used) for the duration of the COVID-19 declaration under Section 564(b)(1) of the Act, 21 U.S.C. section 360bbb-3(b)(1), unless the authorization is terminated or revoked.  Performed at [facility], [place], [place] [address]   Respiratory (~20 pathogens) panel by PCR     Status: Abnormal   Collection Time: 07/07/21  9:00 PM   Specimen: Nasopharyngeal Swab; Respiratory  Result Value Ref Range Status   Adenovirus NOT DETECTED NOT DETECTED Final   Coronavirus 229E NOT DETECTED NOT DETECTED Final    Comment: (NOTE) The Coronavirus on the Respiratory Panel, DOES NOT test for the novel  Coronavirus (2019 nCoV)    Coronavirus HKU1 NOT DETECTED NOT DETECTED Final   Coronavirus NL63 NOT DETECTED NOT DETECTED Final   Coronavirus OC43 NOT DETECTED NOT DETECTED Final   Metapneumovirus NOT DETECTED NOT DETECTED Final   Rhinovirus / Enterovirus DETECTED (A) NOT DETECTED Final   Influenza A NOT DETECTED NOT DETECTED Final   Influenza B  NOT DETECTED NOT DETECTED Final   Parainfluenza Virus 1 NOT DETECTED NOT DETECTED Final   Parainfluenza Virus 2 NOT DETECTED NOT DETECTED Final   Parainfluenza Virus 3 NOT DETECTED NOT DETECTED Final   Parainfluenza Virus 4 NOT DETECTED NOT DETECTED Final   Respiratory Syncytial Virus NOT DETECTED NOT DETECTED Final   Bordetella pertussis NOT DETECTED NOT DETECTED Final   Bordetella Parapertussis NOT DETECTED NOT DETECTED Final   Chlamydophila pneumoniae NOT DETECTED NOT DETECTED Final   Mycoplasma pneumoniae NOT DETECTED NOT DETECTED Final    Comment: Performed at [facility], [place]. [address], [place], [place] [address]     Labs: BNP (last 3 results) Recent Labs    08/03/20 1613 07/07/21 0925  BNP 653.2* 254.2*   Basic Metabolic  Panel: Recent Labs  Lab 07/07/21 0925 07/08/21 0452  NA 133* 135  K 4.0 4.9  CL 99 102  CO2 23 27  GLUCOSE 123* 110*  BUN 9 20  CREATININE 0.54* 0.98  CALCIUM 9.1 8.8*   Liver Function Tests: Recent Labs  Lab 07/07/21 0925  AST 24  ALT 12  ALKPHOS 65  BILITOT 1.6*  PROT 8.1  ALBUMIN 4.1   No results for input(s): LIPASE, AMYLASE in the last 168 hours. No results for input(s): AMMONIA in the last 168 hours. CBC: Recent Labs  Lab 07/07/21 0925 07/08/21 0452  WBC 5.6 6.2  NEUTROABS 4.4  --   HGB 10.4* 9.7*  HCT 33.7* 30.1*  MCV 80.2 76.0*  PLT 114* 133*   Cardiac Enzymes: No results for input(s): CKTOTAL, CKMB, CKMBINDEX, TROPONINI in the last 168 hours. BNP: Invalid input(s): POCBNP CBG: No results for input(s): GLUCAP in the last 168 hours. D-Dimer No results for input(s): DDIMER in the last 72 hours. Hgb A1c No results for input(s): HGBA1C in the last 72 hours. Lipid Profile Recent Labs    07/08/21 0452  CHOL 116  HDL 64  LDLCALC 46  TRIG 30  CHOLHDL 1.8   Thyroid function studies No results for input(s): TSH, T4TOTAL, T3FREE, THYROIDAB in the last 72 hours.  Invalid input(s): FREET3 Anemia work up No  results for input(s): VITAMINB12, FOLATE, FERRITIN, TIBC, IRON, RETICCTPCT in the last 72 hours. Urinalysis    Component Value Date/Time   COLORURINE YELLOW (A) 12/24/2017 0914   APPEARANCEUR HAZY (A) 12/24/2017 0914   LABSPEC 1.019 12/24/2017 0914   PHURINE 5.0 12/24/2017 0914   GLUCOSEU NEGATIVE 12/24/2017 0914   HGBUR NEGATIVE 12/24/2017 0914   BILIRUBINUR NEGATIVE 12/24/2017 0914   KETONESUR 5 (A) 12/24/2017 0914   PROTEINUR NEGATIVE 12/24/2017 0914   NITRITE NEGATIVE 12/24/2017 0914   LEUKOCYTESUR SMALL (A) 12/24/2017 0914   Sepsis Labs Invalid input(s): PROCALCITONIN,  WBC,  LACTICIDVEN Microbiology Recent Results (from the past 240 hour(s))  Resp Panel by RT-PCR (Flu A&B, Covid) Nasopharyngeal Swab     Status: None   Collection Time: 07/07/21  9:25 AM   Specimen: Nasopharyngeal Swab; Nasopharyngeal(NP) swabs in vial transport medium  Result Value Ref Range Status   SARS Coronavirus 2 by RT PCR NEGATIVE NEGATIVE Final    Comment: (NOTE) SARS-CoV-2 target nucleic acids are NOT DETECTED.  The SARS-CoV-2 RNA is generally detectable in upper respiratory specimens during the acute phase of infection. The lowest concentration of SARS-CoV-2 viral copies this assay can detect is 138 copies/mL. A negative result does not preclude SARS-Cov-2 infection and should not be used as the sole basis for treatment or other patient management decisions. A negative result may occur with  improper specimen collection/handling, submission of specimen other than nasopharyngeal swab, presence of viral mutation(s) within the areas targeted by this assay, and inadequate number of viral copies(<138 copies/mL). A negative result must be combined with clinical observations, patient history, and epidemiological information. The expected result is Negative.  Fact Sheet for Patients:  EntrepreneurPulse.com.au  Fact Sheet for Healthcare Providers:   IncredibleEmployment.be  This test is no t yet approved or cleared by the [place] FDA and  has been authorized for detection and/or diagnosis of SARS-CoV-2 by FDA under an Emergency Use Authorization (EUA). This EUA will remain  in effect (meaning this test can be used) for the duration of the COVID-19 declaration under Section 564(b)(1) of the Act, 21 U.S.C.section 360bbb-3(b)(1), unless the authorization is  terminated  or revoked sooner.       Influenza A by PCR NEGATIVE NEGATIVE Final   Influenza B by PCR NEGATIVE NEGATIVE Final    Comment: (NOTE) The Xpert Xpress SARS-CoV-2/FLU/RSV plus assay is intended as an aid in the diagnosis of influenza from Nasopharyngeal swab specimens and should not be used as a sole basis for treatment. Nasal washings and aspirates are unacceptable for Xpert Xpress SARS-CoV-2/FLU/RSV testing.  Fact Sheet for Patients: EntrepreneurPulse.com.au  Fact Sheet for Healthcare Providers: IncredibleEmployment.be  This test is not yet approved or cleared by the [place] FDA and has been authorized for detection and/or diagnosis of SARS-CoV-2 by FDA under an Emergency Use Authorization (EUA). This EUA will remain in effect (meaning this test can be used) for the duration of the COVID-19 declaration under Section 564(b)(1) of the Act, 21 U.S.C. section 360bbb-3(b)(1), unless the authorization is terminated or revoked.  Performed at [facility], [place], [place] [address]   Respiratory (~20 pathogens) panel by PCR     Status: Abnormal   Collection Time: 07/07/21  9:00 PM   Specimen: Nasopharyngeal Swab; Respiratory  Result Value Ref Range Status   Adenovirus NOT DETECTED NOT DETECTED Final   Coronavirus 229E NOT DETECTED NOT DETECTED Final    Comment: (NOTE) The Coronavirus on the Respiratory Panel, DOES NOT test for the novel  Coronavirus (2019 nCoV)     Coronavirus HKU1 NOT DETECTED NOT DETECTED Final   Coronavirus NL63 NOT DETECTED NOT DETECTED Final   Coronavirus OC43 NOT DETECTED NOT DETECTED Final   Metapneumovirus NOT DETECTED NOT DETECTED Final   Rhinovirus / Enterovirus DETECTED (A) NOT DETECTED Final   Influenza A NOT DETECTED NOT DETECTED Final   Influenza B NOT DETECTED NOT DETECTED Final   Parainfluenza Virus 1 NOT DETECTED NOT DETECTED Final   Parainfluenza Virus 2 NOT DETECTED NOT DETECTED Final   Parainfluenza Virus 3 NOT DETECTED NOT DETECTED Final   Parainfluenza Virus 4 NOT DETECTED NOT DETECTED Final   Respiratory Syncytial Virus NOT DETECTED NOT DETECTED Final   Bordetella pertussis NOT DETECTED NOT DETECTED Final   Bordetella Parapertussis NOT DETECTED NOT DETECTED Final   Chlamydophila pneumoniae NOT DETECTED NOT DETECTED Final   Mycoplasma pneumoniae NOT DETECTED NOT DETECTED Final    Comment: Performed at [facility], [place]. [address] [place], [place] [address]    Time coordinating discharge: Over 30 minutes  SIGNED:  Lorella Nimrod, MD  [facility] 07/08/2021, 12:22 PM  If 7PM-7AM, please contact night-coverage [email or web address]  This record has been created using Systems analyst. Errors have been sought and corrected,but may not always be located. Such creation errors do not reflect on the standard of care.

## 2021-07-08 NOTE — Plan of Care (Signed)
°  Problem: Education: Goal: Knowledge of General Education information will improve Description: Including pain rating scale, medication(s)/side effects and non-pharmacologic comfort measures Outcome: Progressing   Problem: Health Behavior/Discharge Planning: Goal: Ability to manage health-related needs will improve Outcome: Progressing   Problem: Clinical Measurements: Goal: Respiratory complications will improve Outcome: Progressing   Problem: Nutrition: Goal: Adequate nutrition will be maintained Outcome: Progressing   Problem: Coping: Goal: Level of anxiety will decrease Outcome: Progressing   Problem: Elimination: Goal: Will not experience complications related to urinary retention Outcome: Progressing   Problem: Elimination: Goal: Will not experience complications related to bowel motility Outcome: Progressing   Problem: Pain Managment: Goal: General experience of comfort will improve Outcome: Progressing   Problem: Safety: Goal: Ability to remain free from injury will improve Outcome: Progressing   Problem: Skin Integrity: Goal: Risk for impaired skin integrity will decrease Outcome: Progressing

## 2021-09-13 ENCOUNTER — Encounter: Payer: Self-pay | Admitting: Emergency Medicine

## 2021-09-13 ENCOUNTER — Other Ambulatory Visit: Payer: Self-pay

## 2021-09-13 ENCOUNTER — Emergency Department: Payer: Medicare Other

## 2021-09-13 DIAGNOSIS — J9621 Acute and chronic respiratory failure with hypoxia: Secondary | ICD-10-CM | POA: Diagnosis present

## 2021-09-13 DIAGNOSIS — Z20822 Contact with and (suspected) exposure to covid-19: Secondary | ICD-10-CM | POA: Diagnosis present

## 2021-09-13 DIAGNOSIS — R5381 Other malaise: Secondary | ICD-10-CM | POA: Diagnosis present

## 2021-09-13 DIAGNOSIS — Z79899 Other long term (current) drug therapy: Secondary | ICD-10-CM

## 2021-09-13 DIAGNOSIS — J690 Pneumonitis due to inhalation of food and vomit: Principal | ICD-10-CM | POA: Diagnosis present

## 2021-09-13 DIAGNOSIS — H409 Unspecified glaucoma: Secondary | ICD-10-CM | POA: Diagnosis present

## 2021-09-13 DIAGNOSIS — Z7951 Long term (current) use of inhaled steroids: Secondary | ICD-10-CM

## 2021-09-13 DIAGNOSIS — Z85118 Personal history of other malignant neoplasm of bronchus and lung: Secondary | ICD-10-CM

## 2021-09-13 DIAGNOSIS — T380X5A Adverse effect of glucocorticoids and synthetic analogues, initial encounter: Secondary | ICD-10-CM | POA: Diagnosis present

## 2021-09-13 DIAGNOSIS — F17210 Nicotine dependence, cigarettes, uncomplicated: Secondary | ICD-10-CM | POA: Diagnosis present

## 2021-09-13 DIAGNOSIS — Z8249 Family history of ischemic heart disease and other diseases of the circulatory system: Secondary | ICD-10-CM

## 2021-09-13 DIAGNOSIS — Z9981 Dependence on supplemental oxygen: Secondary | ICD-10-CM

## 2021-09-13 DIAGNOSIS — I10 Essential (primary) hypertension: Secondary | ICD-10-CM | POA: Diagnosis present

## 2021-09-13 DIAGNOSIS — R739 Hyperglycemia, unspecified: Secondary | ICD-10-CM | POA: Diagnosis present

## 2021-09-13 DIAGNOSIS — J439 Emphysema, unspecified: Secondary | ICD-10-CM | POA: Diagnosis present

## 2021-09-13 DIAGNOSIS — Z9221 Personal history of antineoplastic chemotherapy: Secondary | ICD-10-CM

## 2021-09-13 DIAGNOSIS — Z923 Personal history of irradiation: Secondary | ICD-10-CM

## 2021-09-13 DIAGNOSIS — D638 Anemia in other chronic diseases classified elsewhere: Secondary | ICD-10-CM | POA: Diagnosis present

## 2021-09-13 LAB — CBC
HCT: 37.2 % — ABNORMAL LOW (ref 39.0–52.0)
Hemoglobin: 11.6 g/dL — ABNORMAL LOW (ref 13.0–17.0)
MCH: 24.2 pg — ABNORMAL LOW (ref 26.0–34.0)
MCHC: 31.2 g/dL (ref 30.0–36.0)
MCV: 77.5 fL — ABNORMAL LOW (ref 80.0–100.0)
Platelets: 163 10*3/uL (ref 150–400)
RBC: 4.8 MIL/uL (ref 4.22–5.81)
RDW: 16 % — ABNORMAL HIGH (ref 11.5–15.5)
WBC: 6.1 10*3/uL (ref 4.0–10.5)
nRBC: 0 % (ref 0.0–0.2)

## 2021-09-13 LAB — BASIC METABOLIC PANEL
Anion gap: 10 (ref 5–15)
BUN: 19 mg/dL (ref 8–23)
CO2: 33 mmol/L — ABNORMAL HIGH (ref 22–32)
Calcium: 9.3 mg/dL (ref 8.9–10.3)
Chloride: 96 mmol/L — ABNORMAL LOW (ref 98–111)
Creatinine, Ser: 0.94 mg/dL (ref 0.61–1.24)
GFR, Estimated: >60 mL/min (ref >60)
Glucose, Bld: 153 mg/dL — ABNORMAL HIGH (ref 70–99)
Potassium: 4 mmol/L (ref 3.5–5.1)
Sodium: 139 mmol/L (ref 135–145)

## 2021-09-13 LAB — TROPONIN I (HIGH SENSITIVITY)
Troponin I (High Sensitivity): 13 ng/L (ref <18)
Troponin I (High Sensitivity): 11 ng/L (ref <18)

## 2021-09-13 NOTE — ED Triage Notes (Signed)
Pt in via [facility] from home, reports worsening shortness of breath over the last couple of months.  Reports now having to wear oxygen all the time at home.  Denies any chest pain.  Vitals WDL, NAD noted at this time.   ?

## 2021-09-13 NOTE — ED Notes (Signed)
Pt arrives via ems with shob/chest pain. History of copd per ems, wheezing per ems, duoneb given, 125mg  solumedrol given by ems, pt wears 3lpm, ems also gave one ntg and asa pta. Per ems pt with peaked t waves on ekg, 18g lac.  ?

## 2021-09-14 ENCOUNTER — Other Ambulatory Visit: Payer: Self-pay

## 2021-09-14 ENCOUNTER — Emergency Department: Payer: Medicare Other

## 2021-09-14 ENCOUNTER — Inpatient Hospital Stay
Admission: EM | Admit: 2021-09-14 | Discharge: 2021-09-17 | DRG: 177 | Disposition: A | Discharge status: Home Health | Payer: Medicare Other | Attending: Internal Medicine | Admitting: Internal Medicine

## 2021-09-14 DIAGNOSIS — D638 Anemia in other chronic diseases classified elsewhere: Secondary | ICD-10-CM | POA: Diagnosis present

## 2021-09-14 DIAGNOSIS — R739 Hyperglycemia, unspecified: Secondary | ICD-10-CM | POA: Diagnosis present

## 2021-09-14 DIAGNOSIS — Z8249 Family history of ischemic heart disease and other diseases of the circulatory system: Secondary | ICD-10-CM | POA: Diagnosis not present

## 2021-09-14 DIAGNOSIS — Z923 Personal history of irradiation: Secondary | ICD-10-CM | POA: Diagnosis not present

## 2021-09-14 DIAGNOSIS — R5381 Other malaise: Secondary | ICD-10-CM | POA: Diagnosis present

## 2021-09-14 DIAGNOSIS — F17210 Nicotine dependence, cigarettes, uncomplicated: Secondary | ICD-10-CM | POA: Diagnosis present

## 2021-09-14 DIAGNOSIS — Z720 Tobacco use: Secondary | ICD-10-CM | POA: Diagnosis present

## 2021-09-14 DIAGNOSIS — J439 Emphysema, unspecified: Secondary | ICD-10-CM | POA: Diagnosis present

## 2021-09-14 DIAGNOSIS — Z20822 Contact with and (suspected) exposure to covid-19: Secondary | ICD-10-CM | POA: Diagnosis present

## 2021-09-14 DIAGNOSIS — Z85118 Personal history of other malignant neoplasm of bronchus and lung: Secondary | ICD-10-CM | POA: Diagnosis not present

## 2021-09-14 DIAGNOSIS — H409 Unspecified glaucoma: Secondary | ICD-10-CM | POA: Diagnosis present

## 2021-09-14 DIAGNOSIS — J441 Chronic obstructive pulmonary disease with (acute) exacerbation: Secondary | ICD-10-CM | POA: Diagnosis not present

## 2021-09-14 DIAGNOSIS — J9621 Acute and chronic respiratory failure with hypoxia: Secondary | ICD-10-CM

## 2021-09-14 DIAGNOSIS — J690 Pneumonitis due to inhalation of food and vomit: Principal | ICD-10-CM

## 2021-09-14 DIAGNOSIS — T380X5A Adverse effect of glucocorticoids and synthetic analogues, initial encounter: Secondary | ICD-10-CM | POA: Diagnosis present

## 2021-09-14 DIAGNOSIS — Z7951 Long term (current) use of inhaled steroids: Secondary | ICD-10-CM | POA: Diagnosis not present

## 2021-09-14 DIAGNOSIS — Z79899 Other long term (current) drug therapy: Secondary | ICD-10-CM | POA: Diagnosis not present

## 2021-09-14 DIAGNOSIS — Z9221 Personal history of antineoplastic chemotherapy: Secondary | ICD-10-CM | POA: Diagnosis not present

## 2021-09-14 DIAGNOSIS — Z9981 Dependence on supplemental oxygen: Secondary | ICD-10-CM | POA: Diagnosis not present

## 2021-09-14 DIAGNOSIS — I10 Essential (primary) hypertension: Secondary | ICD-10-CM | POA: Diagnosis present

## 2021-09-14 LAB — LACTIC ACID, PLASMA: Lactic Acid, Venous: 1.7 mmol/L (ref 0.5–1.9)

## 2021-09-14 LAB — TROPONIN I (HIGH SENSITIVITY): Troponin I (High Sensitivity): 8 ng/L (ref <18)

## 2021-09-14 LAB — BRAIN NATRIURETIC PEPTIDE: B Natriuretic Peptide: 126.1 pg/mL — ABNORMAL HIGH (ref 0.0–100.0)

## 2021-09-14 LAB — RESP PANEL BY RT-PCR (FLU A&B, COVID) ARPGX2
Influenza A by PCR: NEGATIVE
Influenza B by PCR: NEGATIVE
SARS Coronavirus 2 by RT PCR: NEGATIVE

## 2021-09-14 MED ORDER — ACETAMINOPHEN 325 MG PO TABS: 650.0000 mg | ORAL_TABLET | Freq: Four times a day (QID) | ORAL | Status: DC | PRN

## 2021-09-14 MED ORDER — IPRATROPIUM-ALBUTEROL 0.5-2.5 (3) MG/3ML IN SOLN: 3.0000 mL | RESPIRATORY_TRACT | Status: DC | PRN

## 2021-09-14 MED ORDER — AMPICILLIN-SULBACTAM SODIUM 3 (2-1) G IJ SOLR
3.0000 g | Freq: Once | INTRAMUSCULAR | Status: AC
  Administered 2021-09-14: 3 g via INTRAVENOUS
  Filled 2021-09-14: qty 8

## 2021-09-14 MED ORDER — ENOXAPARIN SODIUM 40 MG/0.4ML IJ SOSY
40.0000 mg | PREFILLED_SYRINGE | INTRAMUSCULAR | Status: DC
  Administered 2021-09-14 – 2021-09-16 (×3): 40 mg via SUBCUTANEOUS
  Filled 2021-09-14 (×3): qty 0.4

## 2021-09-14 MED ORDER — SODIUM CHLORIDE 0.9 % IV SOLN
3.0000 g | Freq: Four times a day (QID) | INTRAVENOUS | Status: DC
  Administered 2021-09-14 – 2021-09-16 (×8): 3 g via INTRAVENOUS
  Filled 2021-09-14 (×2): qty 8
  Filled 2021-09-14: qty 3
  Filled 2021-09-14 (×6): qty 8
  Filled 2021-09-14: qty 3
  Filled 2021-09-14 (×3): qty 8

## 2021-09-14 MED ORDER — UMECLIDINIUM BROMIDE 62.5 MCG/ACT IN AEPB
1.0000 | INHALATION_SPRAY | Freq: Every day | RESPIRATORY_TRACT | Status: DC
  Administered 2021-09-14 – 2021-09-16 (×2): 1 via RESPIRATORY_TRACT
  Filled 2021-09-14: qty 7

## 2021-09-14 MED ORDER — IOHEXOL 350 MG/ML SOLN
100.0000 mL | Freq: Once | INTRAVENOUS | Status: AC | PRN
  Administered 2021-09-14: 100 mL via INTRAVENOUS

## 2021-09-14 MED ORDER — METHYLPREDNISOLONE SODIUM SUCC 40 MG IJ SOLR
40.0000 mg | Freq: Two times a day (BID) | INTRAMUSCULAR | Status: DC
  Administered 2021-09-14 – 2021-09-15 (×2): 40 mg via INTRAVENOUS
  Filled 2021-09-14 (×2): qty 1

## 2021-09-14 MED ORDER — NICOTINE 7 MG/24HR TD PT24
7.0000 mg | MEDICATED_PATCH | Freq: Every day | TRANSDERMAL | Status: DC
  Filled 2021-09-14 (×4): qty 1

## 2021-09-14 MED ORDER — ACETAMINOPHEN 650 MG RE SUPP: 650.0000 mg | Freq: Four times a day (QID) | RECTAL | Status: DC | PRN

## 2021-09-14 MED ORDER — ONDANSETRON HCL 4 MG PO TABS
4.0000 mg | ORAL_TABLET | Freq: Four times a day (QID) | ORAL | Status: DC | PRN
Start: 2021-09-14 — End: 2021-09-17

## 2021-09-14 MED ORDER — ONDANSETRON HCL 4 MG/2ML IJ SOLN: 4.0000 mg | Freq: Four times a day (QID) | INTRAMUSCULAR | Status: DC | PRN

## 2021-09-14 MED ORDER — FLUTICASONE FUROATE-VILANTEROL 100-25 MCG/ACT IN AEPB
1.0000 | INHALATION_SPRAY | Freq: Every day | RESPIRATORY_TRACT | Status: DC
  Administered 2021-09-14 – 2021-09-16 (×2): 1 via RESPIRATORY_TRACT
  Filled 2021-09-14: qty 28

## 2021-09-14 MED ORDER — DORZOLAMIDE HCL-TIMOLOL MAL 2-0.5 % OP SOLN
1.0000 [drp] | Freq: Two times a day (BID) | OPHTHALMIC | Status: DC
  Administered 2021-09-14 – 2021-09-17 (×6): 1 [drp] via OPHTHALMIC
  Filled 2021-09-14: qty 10

## 2021-09-14 MED ORDER — SODIUM CHLORIDE 0.9 % IV BOLUS
1000.0000 mL | Freq: Once | INTRAVENOUS | Status: AC
  Administered 2021-09-14: 1000 mL via INTRAVENOUS

## 2021-09-14 MED ORDER — ACETAMINOPHEN 325 MG PO TABS
650.0000 mg | ORAL_TABLET | Freq: Four times a day (QID) | ORAL | Status: DC | PRN
Start: 2021-09-14 — End: 2021-09-17

## 2021-09-14 MED ORDER — LATANOPROST 0.005 % OP SOLN
1.0000 [drp] | Freq: Every day | OPHTHALMIC | Status: DC
  Administered 2021-09-14 – 2021-09-16 (×3): 1 [drp] via OPHTHALMIC
  Filled 2021-09-14: qty 2.5

## 2021-09-14 MED ORDER — IPRATROPIUM-ALBUTEROL 0.5-2.5 (3) MG/3ML IN SOLN: 3.0000 mL | Freq: Four times a day (QID) | RESPIRATORY_TRACT | Status: DC | PRN

## 2021-09-14 MED ORDER — DM-GUAIFENESIN ER 30-600 MG PO TB12
1.0000 | ORAL_TABLET | Freq: Two times a day (BID) | ORAL | Status: DC
  Administered 2021-09-14 – 2021-09-17 (×6): 1 via ORAL
  Filled 2021-09-14 (×7): qty 1

## 2021-09-14 NOTE — Assessment & Plan Note (Signed)
Patient presents to the ER for evaluation of chest pain and shortness of breath. ?He had a CT angiogram of the chest which showed retained secretions ?in the trachea at the thoracic inlet, and partially opacified posterior basal segment right lower lobe airways with platelike ?lower lobe opacity. Consider aspiration, bronchopneumonia, or ?atelectasis. Chronic scarring in both middle lobes. ?Will start patient on IV Unasyn ?Speech therapy consult for swallow function evaluation ?Consult pulmonary ? ? ? ? ?

## 2021-09-14 NOTE — ED Notes (Signed)
Informed RN bed assigned 

## 2021-09-14 NOTE — ED Notes (Signed)
Transportation requested  

## 2021-09-14 NOTE — ED Provider Notes (Addendum)
[facility] Provider Note    Event Date/Time   First MD Initiated Contact with Patient 09/14/21 [phone]     (approximate)   History   Shortness of Breath   HPI  Carl Bell is a 71 y.o. male with a history of COPD, chronic respiratory failure on 2 L of oxygen, hypertension, lung cancer on remission who presents for evaluation of shortness of breath.  Patient called 911 complaining of shortness of breath and chest tightness and sharp chest pain.  Had diffuse wheezing when they arrived.  Received DuoNeb, 125 mg of Solu-Medrol, 1 nitroglycerin and aspirin.  During my evaluation patient reports that his symptoms are fully resolved.  Denies any chest pain or shortness of breath.  He reports that his shortness of breath has been getting progressively worse over the last several months.  He was initially on oxygen as needed but over the last several weeks has been on it constantly.  The shortness of breath became severe this evening.  He denies any prior history of PE or DVT.  He does describe having sharp chest pain this evening with his shortness of breath but right now the chest pain has resolved.  He denies hemoptysis, he does report bilateral leg swelling which is new     Past Medical History:  Diagnosis Date   COPD (chronic obstructive pulmonary disease) ([place])    Emphysema of lung ([place])    Glaucoma    Hypertension    Lung cancer [facility])     Past Surgical History:  Procedure Laterality Date   LOWER EXTREMITY ANGIOGRAPHY Left 07/11/2019   Procedure: LOWER EXTREMITY ANGIOGRAPHY;  Surgeon: Algernon Huxley, MD;  Location: [place] [facility];  Service: Cardiovascular;  Laterality: Left;   LUNG BIOPSY     PERIPHERAL VASCULAR CATHETERIZATION N/A 09/24/2015   Procedure: [name];  Surgeon: Algernon Huxley, MD;  Location: [place] [facility];  Service: Cardiovascular;  Laterality: N/A;   Port a cath placement       Physical Exam   Triage Vital Signs: ED  Triage Vitals  Enc Vitals Group     BP 09/13/21 2057 113/76     Pulse Rate 09/13/21 2057 (!) 103     Resp 09/13/21 2057 17     Temp 09/13/21 2057 97.7 F (36.5 C)     Temp Source 09/13/21 2057 Oral     SpO2 09/13/21 2057 98 %     Weight 09/13/21 2059 160 lb (72.6 kg)     Height 09/13/21 2059 6\' 1"  (1.854 m)     Head Circumference --      Peak Flow --      Pain Score 09/13/21 2059 0     Pain Loc --      Pain Edu? --      Excl. in [place]? --     Most recent vital signs: Vitals:   09/13/21 2321 09/14/21 0156  BP: (!) 132/91 120/85  Pulse: 86 91  Resp: 18 16  Temp:    SpO2: 98% 97%     Constitutional: Alert and oriented. Well appearing and in no apparent distress. HEENT:      Head: Normocephalic and atraumatic.         Eyes: Conjunctivae are normal. Sclera is non-icteric.       Mouth/Throat: Mucous membranes are moist.       Neck: Supple with no signs of meningismus. Cardiovascular: Regular rate and rhythm. No murmurs, gallops, or  rubs. 2+ symmetrical distal pulses are present in all extremities.  Respiratory: Normal respiratory effort.  Slightly decreased air movement bilaterally with no wheezing or crackles Gastrointestinal: Soft, non tender, and non distended with positive bowel sounds. No rebound or guarding. Genitourinary: No CVA tenderness. Musculoskeletal: Trace pitting edema bilateral lower extremities Neurologic: Normal speech and language. Face is symmetric. Moving all extremities. No gross focal neurologic deficits are appreciated. Skin: Skin is warm, dry and intact. No rash noted. Psychiatric: Mood and affect are normal. Speech and behavior are normal.  ED Results / Procedures / Treatments   Labs (all labs ordered are listed, but only abnormal results are displayed) Labs Reviewed  BASIC METABOLIC PANEL - Abnormal; Notable for the following components:      Result Value   Chloride 96 (*)    CO2 33 (*)    Glucose, Bld 153 (*)    All other components within  normal limits  CBC - Abnormal; Notable for the following components:   Hemoglobin 11.6 (*)    HCT 37.2 (*)    MCV 77.5 (*)    MCH 24.2 (*)    RDW 16.0 (*)    All other components within normal limits  BRAIN NATRIURETIC PEPTIDE - Abnormal; Notable for the following components:   B Natriuretic Peptide 126.1 (*)    All other components within normal limits  CULTURE, BLOOD (ROUTINE X 2)  CULTURE, BLOOD (ROUTINE X 2)  LACTIC ACID, PLASMA  LACTIC ACID, PLASMA  TROPONIN I (HIGH SENSITIVITY)  TROPONIN I (HIGH SENSITIVITY)  TROPONIN I (HIGH SENSITIVITY)     Dr. Leonides Schanz EKG  ED ECG REPORT I, Rudene Re, the attending physician, personally viewed and interpreted this ECG.  Sinus rhythm with a rate of 87, patient has concave up ST elevations diffusely on inferior and lateral leads with T wave inversions in aVR and aVL.  Review of old EKGs does not mean anything thinks it might be just from the hands her dog dog bites also.  This supplemented by antibiotics she has been department the baby is pulled out and she has been   RADIOLOGY I, Rudene Re, attending MD, have personally viewed and interpreted the images obtained during this visit as below:  CT concerning for PNA   ___________________________________________________ Interpretation by Radiologist:  DG Chest 2 View  Result Date: 09/13/2021 CLINICAL DATA:  Worsening shortness of breath for several months, increasing oxygen requirement, COPD EXAM: CHEST - 2 VIEW COMPARISON:  07/07/2021 FINDINGS: Frontal and lateral views of the chest demonstrates stable right chest wall port. Cardiac silhouette is unremarkable. Chronic left lung scarring. Background emphysema unchanged. No airspace disease, effusion, or pneumothorax. IMPRESSION: 1. Stable emphysema and left perihilar scarring. No acute airspace disease. Electronically Signed   By: Randa Ngo M.D.   On: 09/13/2021 21:19   CT Angio Chest PE W and/or Wo Contrast  Result  Date: 09/14/2021 CLINICAL DATA:  71 year old male with increasing shortness of breath. Constant oxygen requirement now. History of treated right middle lobe lung cancer. EXAM: CT ANGIOGRAPHY CHEST WITH CONTRAST TECHNIQUE: Multidetector CT imaging of the chest was performed using the standard protocol during bolus administration of intravenous contrast. Multiplanar CT image reconstructions and MIPs were obtained to evaluate the vascular anatomy. RADIATION DOSE REDUCTION: This exam was performed according to the departmental dose-optimization program which includes automated exposure control, adjustment of the mA and/or kV according to patient size and/or use of iterative reconstruction technique. CONTRAST:  175mL OMNIPAQUE IOHEXOL 350 MG/ML SOLN COMPARISON:  Chest  radiographs 09/13/2021 and earlier. Chest CTA 07/23/2017. restaging chest CT 05/20/2019. FINDINGS: Cardiovascular: Excellent contrast bolus timing in the pulmonary arterial tree. No focal filling defect identified in the pulmonary arteries to suggest acute pulmonary embolism. Calcified aortic atherosclerosis. Stable tortuosity of the thoracic aorta. No cardiomegaly. Small, probably physiologic pericardial effusion is stable since 2020. Calcified coronary artery atherosclerosis. Little contrast in the aorta. Bilateral proximal great vessel calcified atherosclerosis. Right chest Port-A-Cath in place. Mediastinum/Nodes: Negative. No mediastinal mass or lymphadenopathy. Lungs/Pleura: Chronic emphysema is moderate to severe. Retained secretions in the trachea at the thoracic inlet on series 6, image 5. But other central airways remain patent. There is new partial opacification of left lower lobe airways (series 6, image 80) with platelike new posterior basal segment right lower lobe opacity on series 6, image 90. Patchy opacity continues to the posterior right costophrenic angle. Chronic architectural distortion in both the lingula and along the right major  fissure are stable since 2020. No pleural effusion or other acute pulmonary opacity. Upper Abdomen: Small volume perihepatic ascites is chronic but increased since 2020. Chronic calcific pancreatitis partially visible. Partially calcified exophytic roughly 3 cm left renal midpole lesion appears grossly unchanged from 02/28/2016 PET-CT, and was negative for hypermetabolism at that time. Negative other visible upper abdominal viscera including adrenal glands. Musculoskeletal: No acute or suspicious osseous lesion Review of the MIP images confirms the above findings. IMPRESSION: 1. Negative for acute pulmonary embolus. 2. Moderate to severe Emphysema (ICD10-J43.9). Retained secretions in the trachea at the thoracic inlet, and partially opacified posterior basal segment right lower lobe airways with platelike lower lobe opacity. Consider aspiration, bronchopneumonia, or atelectasis. Chronic scarring in both middle lobes. No pleural effusion or other acute pulmonary finding. 3. Chronic but increased small volume perihepatic ascites since 2020. Otherwise stable visible upper abdomen. 4. Calcified coronary artery and Aortic Atherosclerosis (ICD10-I70.0). Electronically Signed   By: Genevie Ann M.D.   On: 09/14/2021 05:33      PROCEDURES:  Critical Care performed: Yes  .Critical Care Performed by: Rudene Re, MD Authorized by: Rudene Re, MD   Critical care provider statement:    Critical care time (minutes):  30   Critical care time was exclusive of:  Separately billable procedures and treating other patients   Critical care was necessary to treat or prevent imminent or life-threatening deterioration of the following conditions:  Respiratory failure, circulatory failure, sepsis and shock   Critical care was time spent personally by me on the following activities:  Development of treatment plan with patient or surrogate, discussions with consultants, evaluation of patient's response to treatment,  examination of patient, ordering and review of laboratory studies, ordering and review of radiographic studies, ordering and performing treatments and interventions, pulse oximetry, re-evaluation of patient's condition and review of old charts   I assumed direction of critical care for this patient from another provider in my specialty: no     Care discussed with: admitting provider      IMPRESSION / MDM / [place] / ED COURSE  I reviewed the triage vital signs and the nursing notes.  71 y.o. male with a history of COPD, chronic respiratory failure on 2 L of oxygen, hypertension, lung cancer on remission who presents for evaluation of shortness of breath. Patient arrives after receiving nitroglycerin, aspirin, DuoNeb and Solu-Medrol.  Reports feeling markedly improved.  He has normal work of breathing and normal sats with no wheezing on initial exam.  Ddx: COPD exacerbation versus pneumonia versus COVID  versus flu versus PE versus edema/heart failure   Plan: EKG, troponin, chest x-ray, COVID and flu swab, CBC, metabolic panel.  Patient placed on telemetry for close monitoring of cardiorespiratory status.   MEDICATIONS GIVEN IN ED: Medications  Ampicillin-Sulbactam (UNASYN) 3 g in sodium chloride 0.9 % 100 mL IVPB (3 g Intravenous New Bag/Given 09/14/21 0631)  ipratropium-albuterol (DUONEB) 0.5-2.5 (3) MG/3ML nebulizer solution 3 mL (has no administration in time range)  acetaminophen (TYLENOL) tablet 650 mg (has no administration in time range)  iohexol (OMNIPAQUE) 350 MG/ML injection 100 mL (100 mLs Intravenous Contrast Given 09/14/21 0451)  sodium chloride 0.9 % bolus 1,000 mL (1,000 mLs Intravenous New Bag/Given 09/14/21 0634)     ED COURSE: Patient is EKG showing diffuse inferior and lateral ST elevations concave up.  Review of other EKGs show the patient has had similar EKG in the past.  He denies any active chest pain.  I did do 3 high-sensitivity troponins which were all  unremarkable and repeat EKGs which are unchanged.  Do believe this is his baseline at this time but he was sent for a CT angio to rule out PE as a cause of this finding.  CT is negative for PE but does show concerns for aspiration pneumonia.  Patient is tachycardic with a pulse in the 120s, normal temperature and normal BP.  Will give IV fluids and Unasyn.  I did review his medical records including his last echo from January 2022 showing normal EF 55-60%. Will consult hospitalist for admission.    Consults: Hospitalist   EMR reviewed including patient's last admission to the hospital in December 2022 for COPD exacerbation    FINAL CLINICAL IMPRESSION(S) / ED DIAGNOSES   Final diagnoses:  Acute on chronic respiratory failure with hypoxia (HCC)  Aspiration pneumonia, unspecified aspiration pneumonia type, unspecified laterality, unspecified part of lung ([place])     Rx / DC Orders   ED Discharge Orders     None        Note:  This document was prepared using Dragon voice recognition software and may include unintentional dictation errors.   Please note:  Patient was evaluated in Emergency Department today for the symptoms described in the history of present illness. Patient was evaluated in the context of the global COVID-19 pandemic, which necessitated consideration that the patient might be at risk for infection with the SARS-CoV-2 virus that causes COVID-19. Institutional protocols and algorithms that pertain to the evaluation of patients at risk for COVID-19 are in a state of rapid change based on information released by regulatory bodies including the CDC and federal and state organizations. These policies and algorithms were followed during the patient's care in the ED.  Some ED evaluations and interventions may be delayed as a result of limited staffing during the pandemic.       Alfred Levins, [place], MD 09/14/21 [place], [place], MD 09/14/21 0700

## 2021-09-14 NOTE — Consult Note (Signed)
Pulmonary Medicine          Date: 09/14/2021,   MRN# [id number] Carl Bell 16-Apr-1951     AdmissionWeight: 72.6 kg                 CurrentWeight: 72.6 kg      CHIEF COMPLAINT:   Sob, copd exacerbation and pneumonia.    HISTORY OF PRESENT ILLNESS   This is a 71 yr old male ho has a past  medical history significant for lung cancer status post chemo and radiation therapy, in remission, COPD, peripheral arterial disease and hypertension who presents to the emergency room for evaluation of shortness of breath. He has been having difficulty over the last several weeks.  He was aggressively treated and has responded. Breathing easier. He has chest pain, pulmonary embolism was ruled out on ct angio. RLL pneumonia was seen. Debris in trachea, raising the suspicion of aspiration. Speech was consulted. Speech exam reveal good swallowing mechanism.    PAST MEDICAL HISTORY   Past Medical History:  Diagnosis Date   COPD (chronic obstructive pulmonary disease) ([place])    Emphysema of lung ([place])    Glaucoma    Hypertension    Lung cancer ([place])      SURGICAL HISTORY   Past Surgical History:  Procedure Laterality Date   LOWER EXTREMITY ANGIOGRAPHY Left 07/11/2019   Procedure: LOWER EXTREMITY ANGIOGRAPHY;  Surgeon: Algernon Huxley, MD;  Location: [facility];  Service: Cardiovascular;  Laterality: Left;   LUNG BIOPSY     PERIPHERAL VASCULAR CATHETERIZATION N/A 09/24/2015   Procedure: [name];  Surgeon: Algernon Huxley, MD;  Location: [facility];  Service: Cardiovascular;  Laterality: N/A;   Port a cath placement       FAMILY HISTORY   Family History  Problem Relation Age of Onset   Diabetes Mellitus II Mother    Hypertension Father      SOCIAL HISTORY   Social History   Tobacco Use   Smoking status: Every Day    Packs/day: 0.50    Years: 50.00    Pack years: 25.00    Types: Cigarettes   Smokeless tobacco: Never   Vaping Use   Vaping Use: Never used  Substance Use Topics   Alcohol use: Yes   Drug use: No     MEDICATIONS    Home Medication:  Current Outpatient Rx   Order #: [id number] Class: Normal   Order #: [id number] Class: Historical Med   Order #: [id number] Class: Normal   Order #: [id number] Class: Historical Med   Order #: [id number] Class: Historical Med   Order #: [id number] Class: Normal   Order #: [id number] Class: Normal    Current Medication:  Current Facility-Administered Medications:    acetaminophen (TYLENOL) tablet 650 mg, 650 mg, Oral, Q6H PRN **OR** acetaminophen (TYLENOL) suppository 650 mg, 650 mg, Rectal, Q6H PRN, Agbata, Tochukwu, MD   acetaminophen (TYLENOL) tablet 650 mg, 650 mg, Oral, Q6H PRN, Athena Masse, MD   Ampicillin-Sulbactam (UNASYN) 3 g in sodium chloride 0.9 % 100 mL IVPB, 3 g, Intravenous, Q6H, Agbata, Tochukwu, MD, Last Rate: 200 mL/hr at 09/14/21 1142, 3 g at 09/14/21 1142   dextromethorphan-guaiFENesin (MUCINEX DM) 30-600 MG per 12 hr tablet 1 tablet, 1 tablet, Oral, BID, Agbata, Tochukwu, MD, 1 tablet at 09/14/21 1142   dorzolamide-timolol (COSOPT) 22.3-6.8 MG/ML ophthalmic solution 1 drop, 1 drop, Both Eyes, BID, Agbata, Tochukwu, MD   enoxaparin (LOVENOX) injection 40 mg, 40 mg, Subcutaneous,  Q24H, Agbata, Tochukwu, MD   fluticasone furoate-vilanterol (BREO ELLIPTA) 100-25 MCG/ACT 1 puff, 1 puff, Inhalation, Daily **AND** umeclidinium bromide (INCRUSE ELLIPTA) 62.5 MCG/ACT 1 puff, 1 puff, Inhalation, Daily, Agbata, Tochukwu, MD   ipratropium-albuterol (DUONEB) 0.5-2.5 (3) MG/3ML nebulizer solution 3 mL, 3 mL, Nebulization, Q4H PRN, Damita Dunnings, Waldemar Dickens, MD   latanoprost (XALATAN) 0.005 % ophthalmic solution 1 drop, 1 drop, Both Eyes, QHS, Agbata, Tochukwu, MD   methylPREDNISolone sodium succinate (SOLU-MEDROL) 40 mg/mL injection 40 mg, 40 mg, Intravenous, Q12H, Agbata, Tochukwu, MD   nicotine (NICODERM CQ - dosed in mg/24 hr) patch 7 mg, 7 mg,  Transdermal, Daily, Agbata, Tochukwu, MD   ondansetron (ZOFRAN) tablet 4 mg, 4 mg, Oral, Q6H PRN **OR** ondansetron (ZOFRAN) injection 4 mg, 4 mg, Intravenous, Q6H PRN, Agbata, Tochukwu, MD  Current Outpatient Medications:    dextromethorphan-guaiFENesin (MUCINEX DM) 30-600 MG 12hr tablet, Take 1 tablet by mouth 2 (two) times daily., Disp: 30 tablet, Rfl: 0   dorzolamide-timolol (COSOPT) 22.3-6.8 MG/ML ophthalmic solution, Place 1 drop into both eyes 2 (two) times daily., Disp: , Rfl:    ipratropium-albuterol (DUONEB) 0.5-2.5 (3) MG/3ML SOLN, Take 3 mLs by nebulization every 6 (six) hours as needed (Shortness of breath)., Disp: 360 mL, Rfl: 0   latanoprost (XALATAN) 0.005 % ophthalmic solution, Place 1 drop into both eyes at bedtime., Disp: , Rfl:    TRELEGY ELLIPTA 100-62.5-25 MCG/ACT AEPB, Inhale 1 puff into the lungs daily., Disp: , Rfl:    albuterol (VENTOLIN HFA) 108 (90 Base) MCG/ACT inhaler, Inhale 2-4 puffs by mouth every 4 hours as needed for wheezing, cough, and/or shortness of breath, Disp: 6.7 g, Rfl: 0   metoprolol tartrate (LOPRESSOR) 25 MG tablet, Take 0.5 tablets (12.5 mg total) by mouth 2 (two) times daily. (Patient not taking: Reported on 09/14/2021), Disp: 60 tablet, Rfl: 1    ALLERGIES   Patient has no known allergies.     REVIEW OF SYSTEMS    Review of Systems:  Gen:  Denies  fever, sweats, chills weigh loss  HEENT: Denies blurred vision, double vision, ear pain, eye pain, hearing loss, nose bleeds, sore throat Cardiac:  No dizziness, chest pain or heaviness, chest tightness,edema Resp:   + cough or sputum porduction, + shortness of breath,wheezing, better since being here. no hemoptysis,  Gi: Denies swallowing difficulty, stomach pain, nausea or vomiting, diarrhea, constipation, bowel incontinence Gu:  Denies bladder incontinence, burning urine Ext:   Denies Joint pain, stiffness or swelling Skin: Denies  skin rash, easy bruising or bleeding or  hives Endoc:  Denies polyuria, polydipsia , polyphagia or weight change Psych:   Denies depression, insomnia or hallucinations   Other:  All other systems negative   VS: BP 135/80 (BP Location: Right Arm)    Pulse 77    Temp 98.2 F (36.8 C) (Oral)    Resp 12    Ht 6\' 1"  (1.854 m)    Wt 72.6 kg    SpO2 100%    BMI 21.11 kg/m      PHYSICAL EXAM    GENERAL:NAD, no fevers, chills, no weakness no fatigue HEAD: Normocephalic, atraumatic.  EYES: Pupils equal, round, reactive to light. Extraocular muscles intact. No scleral icterus.  MOUTH: Moist mucosal membrane. Dentition intact. No abscess noted.  EAR, NOSE, THROAT: Clear without exudates. No external lesions.  NECK: Supple. No thyromegaly. No nodules. No JVD.  PULMONARY: Diffuse coarse rhonchi right sided +wheezes CARDIOVASCULAR: S1 and S2. Regular rate and rhythm. No murmurs, rubs, or gallops.  No edema. Pedal pulses 2+ bilaterally.  GASTROINTESTINAL: Soft, nontender, nondistended. No masses. Positive bowel sounds. No hepatosplenomegaly.  MUSCULOSKELETAL: No swelling, clubbing, or edema. Range of motion full in all extremities.  NEUROLOGIC: Cranial nerves II through XII are intact. No gross focal neurological deficits. Sensation intact. Reflexes intact.  SKIN: No ulceration, lesions, rashes, or cyanosis. Skin warm and dry. Turgor intact.  PSYCHIATRIC: Mood, affect within normal limits. The patient is awake, alert and oriented x 3. Insight, judgment intact.       IMAGING    DG Chest 2 View  Result Date: 09/13/2021 CLINICAL DATA:  Worsening shortness of breath for several months, increasing oxygen requirement, COPD EXAM: CHEST - 2 VIEW COMPARISON:  07/07/2021 FINDINGS: Frontal and lateral views of the chest demonstrates stable right chest wall port. Cardiac silhouette is unremarkable. Chronic left lung scarring. Background emphysema unchanged. No airspace disease, effusion, or pneumothorax. IMPRESSION: 1. Stable emphysema and left  perihilar scarring. No acute airspace disease. Electronically Signed   By: Randa Ngo M.D.   On: 09/13/2021 21:19   CT Angio Chest PE W and/or Wo Contrast  Result Date: 09/14/2021 CLINICAL DATA:  71 year old male with increasing shortness of breath. Constant oxygen requirement now. History of treated right middle lobe lung cancer. EXAM: CT ANGIOGRAPHY CHEST WITH CONTRAST TECHNIQUE: Multidetector CT imaging of the chest was performed using the standard protocol during bolus administration of intravenous contrast. Multiplanar CT image reconstructions and MIPs were obtained to evaluate the vascular anatomy. RADIATION DOSE REDUCTION: This exam was performed according to the departmental dose-optimization program which includes automated exposure control, adjustment of the mA and/or kV according to patient size and/or use of iterative reconstruction technique. CONTRAST:  113mL OMNIPAQUE IOHEXOL 350 MG/ML SOLN COMPARISON:  Chest radiographs 09/13/2021 and earlier. Chest CTA 07/23/2017. restaging chest CT 05/20/2019. FINDINGS: Cardiovascular: Excellent contrast bolus timing in the pulmonary arterial tree. No focal filling defect identified in the pulmonary arteries to suggest acute pulmonary embolism. Calcified aortic atherosclerosis. Stable tortuosity of the thoracic aorta. No cardiomegaly. Small, probably physiologic pericardial effusion is stable since 2020. Calcified coronary artery atherosclerosis. Little contrast in the aorta. Bilateral proximal great vessel calcified atherosclerosis. Right chest Port-A-Cath in place. Mediastinum/Nodes: Negative. No mediastinal mass or lymphadenopathy. Lungs/Pleura: Chronic emphysema is moderate to severe. Retained secretions in the trachea at the thoracic inlet on series 6, image 5. But other central airways remain patent. There is new partial opacification of left lower lobe airways (series 6, image 80) with platelike new posterior basal segment right lower lobe opacity on  series 6, image 90. Patchy opacity continues to the posterior right costophrenic angle. Chronic architectural distortion in both the lingula and along the right major fissure are stable since 2020. No pleural effusion or other acute pulmonary opacity. Upper Abdomen: Small volume perihepatic ascites is chronic but increased since 2020. Chronic calcific pancreatitis partially visible. Partially calcified exophytic roughly 3 cm left renal midpole lesion appears grossly unchanged from 02/28/2016 PET-CT, and was negative for hypermetabolism at that time. Negative other visible upper abdominal viscera including adrenal glands. Musculoskeletal: No acute or suspicious osseous lesion Review of the MIP images confirms the above findings. IMPRESSION: 1. Negative for acute pulmonary embolus. 2. Moderate to severe Emphysema (ICD10-J43.9). Retained secretions in the trachea at the thoracic inlet, and partially opacified posterior basal segment right lower lobe airways with platelike lower lobe opacity. Consider aspiration, bronchopneumonia, or atelectasis. Chronic scarring in both middle lobes. No pleural effusion or other acute pulmonary finding. 3. Chronic but increased  small volume perihepatic ascites since 2020. Otherwise stable visible upper abdomen. 4. Calcified coronary artery and Aortic Atherosclerosis (ICD10-I70.0). Electronically Signed   By: Genevie Ann M.D.   On: 09/14/2021 05:33      ASSESSMENT/PLAN   This is a 71 yr old male, a smoker presents to [place] with worsening dyspnea. Cough and wheezing. On workup has radiographic evidence of pneumonia, possible aspiration, although his swallowing mechanics per speech is good.  Pulmonary problems 1 RLL pneumonia. Suspect aspiration, w/u is in progress -anti aspiration measures ( chew well, small bites) -modified barium swallow on Monday -antibiotic coverage  covering anaerobes and community acquired organisms ( unasyn) -flutter valve ( use 4-5 times q shift) -doing  well no need for acute or immediate bronchoscopy  2 copd. His chest points to significant involvement -advised to stop smoking -albuterol and pulmicort or it equivalent is reasonable -solumedrol 40 mg q 12 hrs -since he has glaucoma. Consider no anticholinergics unless we have too -out patient f/u in pulmonary clinic and pfts  3 phx hx of lung cancer, s/p chemo and xrt. In remission -out patient continued f/u. -scarring seen may be due to xrt.    Thank you for allowing me to participate in the care of this patient.   Patient/Family are satisfied with care plan and all questions have been answered.  This document was prepared using Dragon voice recognition software and may include unintentional dictation errors.     Wallene Huh, M.D.  Division of [facility]

## 2021-09-14 NOTE — Assessment & Plan Note (Signed)
Smoking cessation  was discussed with patient in detail ?We will place him on a nicotine transdermal patch ?

## 2021-09-14 NOTE — Assessment & Plan Note (Signed)
Patient with a history of COPD who presents for evaluation of worsening shortness of breath and chest pain ?Noted to have diffuse wheezing upon arrival to the ER and received 1 dose of Solu-Medrol 125mg  IV ?We will place patient on Solu-Medrol 40 mg IV every 12 ?Continue bronchodilator therapy as well as inhaled steroids ?

## 2021-09-14 NOTE — ED Notes (Signed)
Sleeping, arousable to voice. Pt alert, NAD, calm, interactive. Denies sx or complaints. Updated.  ?

## 2021-09-14 NOTE — ED Notes (Signed)
Admitting MD at BS.  

## 2021-09-14 NOTE — Evaluation (Signed)
Clinical/Bedside Swallow Evaluation ?Patient Details  ?Name: Carl Bell ?MRN: [id number] ?Date of Birth: 13-May-1951 ? ?Today's Date: 09/14/2021 ?Time: SLP Start Time (ACUTE ONLY): 1200 SLP Stop Time (ACUTE ONLY): 1230 ?SLP Time Calculation (min) (ACUTE ONLY): 30 min ? ?Past Medical History:  ?Past Medical History:  ?Diagnosis Date  ? COPD (chronic obstructive pulmonary disease) ([place])   ? Emphysema of lung ([place])   ? Glaucoma   ? Hypertension   ? Lung cancer ([place])   ? ?Past Surgical History:  ?Past Surgical History:  ?Procedure Laterality Date  ? LOWER EXTREMITY ANGIOGRAPHY Left 07/11/2019  ? Procedure: LOWER EXTREMITY ANGIOGRAPHY;  Surgeon: Algernon Huxley, MD;  Location: [facility];  Service: Cardiovascular;  Laterality: Left;  ? LUNG BIOPSY    ? PERIPHERAL VASCULAR CATHETERIZATION N/A 09/24/2015  ? Procedure: Porta Cath Insertion;  Surgeon: Algernon Huxley, MD;  Location: [facility];  Service: Cardiovascular;  Laterality: N/A;  ? Port a cath placement    ? ?HPI:  ?Carl Bell is a 71 y.o. male with medical history significant for COPD with chronic respiratory failure, history of lung cancer post chemo and radiation therapy, nicotine dependence and hypertension who presents to the ER via EMS for evaluation of worsening shortness of breath from his baseline associated with chest pain. CT angiogram of the chest which showed moderate to severe Emphysema. Retained secretions in the trachea at the thoracic inlet, and partially opacified posterior basal segment right lower lobe airways with plate like lower lobe opacity. Consider aspiration, bronchopneumonia, or atelectasis. Chronic scarring in both middle lobes. No pleural effusion or other acute pulmonary finding.  ?  ?Assessment / Plan / Recommendation  ?Clinical Impression ? Pt presents with adequate oropharyngeal abilities when consuming puree, graham crackers and thin liquids via straw. Silent aspiration cannot be ruled out at bedside. In discussion  with pt's attending, and given severity of pt's right sided pneumonia, in agreement with MD request for instrumental study to rule out silent aspiration. Study scheduled at 8am on Monday with this provided. MD to place pt on regular diet in the interim. ?SLP Visit Diagnosis: Dysphagia, oropharyngeal phase (R13.12) ?   ?Aspiration Risk ? Mild aspiration risk  ?  ?Diet Recommendation Regular;Thin liquid  ? ?Liquid Administration via: Straw ?Medication Administration: Whole meds with liquid ?Supervision: Patient able to self feed;Staff to assist with self feeding ?Compensations: Minimize environmental distractions;Slow rate;Small sips/bites ?Postural Changes: Seated upright at 90 degrees;Remain upright for at least 30 minutes after po intake  ?  ?Other  Recommendations Oral Care Recommendations: Oral care BID   ? ?Recommendations for follow up therapy are one component of a multi-disciplinary discharge planning process, led by the attending physician.  Recommendations may be updated based on patient status, additional functional criteria and insurance authorization. ? ?Follow up Recommendations  (TBD)  ? ? ?  ?Assistance Recommended at Discharge None  ?Functional Status Assessment Patient has had a recent decline in their functional status and demonstrates the ability to make significant improvements in function in a reasonable and predictable amount of time.  ?Frequency and Duration min 2x/week  ?2 weeks ?  ?   ? ? ?Swallow Study   ?General Date of Onset: 09/14/21 ?HPI: Carl Bell is a 71 y.o. male with medical history significant for COPD with chronic respiratory failure, history of lung cancer post chemo and radiation therapy, nicotine dependence and hypertension who presents to the ER via EMS for evaluation of worsening shortness of breath  from his baseline associated with chest pain. CT angiogram of the chest which showed moderate to severe Emphysema. Retained secretions in the trachea at the thoracic inlet,  and partially opacified posterior basal segment right lower lobe airways with plate like lower lobe opacity. Consider aspiration, bronchopneumonia, or atelectasis. Chronic scarring in both middle lobes. No pleural effusion or other acute pulmonary finding. ?Type of Study: Bedside Swallow Evaluation ?Previous Swallow Assessment: none in chart ?Diet Prior to this Study: NPO ?Temperature Spikes Noted: No ?Respiratory Status: Nasal cannula (4liters) ?History of Recent Intubation: No ?Behavior/Cognition: Alert;Cooperative;Pleasant mood ?Oral Cavity Assessment: Within Functional Limits ?Oral Care Completed by SLP: No ?Oral Cavity - Dentition: Adequate natural dentition ?Vision: Functional for self-feeding ?Self-Feeding Abilities: Needs assist ?Patient Positioning: Upright in bed ?Baseline Vocal Quality: Normal ?Volitional Cough: Strong ?Volitional Swallow: Able to elicit  ?  ?Oral/Motor/Sensory Function Overall Oral Motor/Sensory Function: Within functional limits   ?Ice Chips Ice chips: Within functional limits ?Presentation: Spoon   ?Thin Liquid Thin Liquid: Within functional limits ?Presentation: Straw  ?  ?Nectar Thick Nectar Thick Liquid: Not tested   ?Honey Thick Honey Thick Liquid: Not tested   ?Puree Puree: Within functional limits ?Presentation: Spoon   ?Solid ? ? ?  Solid: Within functional limits  ? ?  ?Carl Bell, M.S., CCC-SLP, CBIS ?Speech-Language Pathologist ?Rehabilitation Services ?Office [phone] ? ?Vicki Chaffin ?09/14/2021,1:34 PM ? ? ? ?

## 2021-09-14 NOTE — ED Notes (Signed)
Pt resting comfortably. Denies pain or sob. SPO2 intermittently poor signal. Remains on 4L [place]. Alert, NAD, calm, interactive.  ?

## 2021-09-14 NOTE — ED Notes (Signed)
First set of blood cultures drawn from right forearm INT that I placed @ [phone] ?Second set of blood cultures drawn from left forearm venipuncture @ [phone] ?

## 2021-09-14 NOTE — ED Notes (Signed)
Upon my arrival to the room, I noted that the patient was not on the cardiac monitor despite the complaint of chest pain and shob. This nurse placed the patient on the monitor and as his rhythm came across, I notified the provider because it appeared the he had some significant ST elevation. I obtained an EKG and showed the provider immediately. She came to the bedside where she stated, after reviewing the chart, that his past EKGs do show the same thing. By this time, a second IV had been established by myself and blood drawn. The provider stated on 2 occassions that he had already had 2 negative troponins and another wouldn't be needed. The blood was set aside in case she decided to order anything.  ?

## 2021-09-14 NOTE — ED Notes (Signed)
New oxygen tank provided, pt update don wait.  ?

## 2021-09-14 NOTE — H&P (Addendum)
History and Physical    Patient: Carl Bell BDZ:[id number] DOB: April 23, 1951 DOA: 09/14/2021 DOS: the patient was seen and examined on 09/14/2021 PCP: System, Provider Not In  Patient coming from: Home  Chief Complaint:  Chief Complaint  Patient presents with   Shortness of Breath    HPI: Carl Bell is a 71 y.o. male with medical history significant for COPD with chronic respiratory failure, history of lung cancer post chemo and radiation therapy, nicotine dependence and hypertension who presents to the ER via EMS for evaluation of worsening shortness of breath from his baseline associated with chest pain.  Chest pain was described as a sharp pain that was nonradiating and he denied having any associated nausea, no vomiting, no diaphoresis or palpitations.  Patient states that his symptoms have been progressive over the last couple of months.  He states that he was prescribed oxygen as needed but he has had to use it continuously over the last couple of months due to his worsening shortness of breath. Per EMS when they arrived patient was noted to have diffuse wheezes and received Solu-Medrol 125 mg IV as well as DuoNeb, nitroglycerin and aspirin. During my evaluation his symptoms have resolved and he feels better. He denies having any cough, no fever, no chills, no dizziness, no lightheadedness, no headache, no palpitations, no diaphoresis, no dizziness or lightheadedness. He had a CT angiogram of the chest which showed moderate to severe Emphysema. Retained secretions in the trachea at the thoracic inlet, and partially opacified posterior basal segment right lower lobe airways with plate like lower lobe opacity. Consider aspiration, bronchopneumonia, or atelectasis. Chronic scarring in both middle lobes. No pleural effusion or other acute pulmonary finding. He received IV antibiotic therapy with Unasyn and will be admitted to the hospital for further evaluation.  Review of Systems: As  mentioned in the history of present illness. All other systems reviewed and are negative. Past Medical History:  Diagnosis Date   COPD (chronic obstructive pulmonary disease) ([place])    Emphysema of lung ([place])    Glaucoma    Hypertension    Lung cancer [facility])    Past Surgical History:  Procedure Laterality Date   LOWER EXTREMITY ANGIOGRAPHY Left 07/11/2019   Procedure: LOWER EXTREMITY ANGIOGRAPHY;  Surgeon: Algernon Huxley, MD;  Location: [facility];  Service: Cardiovascular;  Laterality: Left;   LUNG BIOPSY     PERIPHERAL VASCULAR CATHETERIZATION N/A 09/24/2015   Procedure: [name];  Surgeon: Algernon Huxley, MD;  Location: [facility];  Service: Cardiovascular;  Laterality: N/A;   Port a cath placement     Social History:  reports that he has been smoking cigarettes. He has a 25.00 pack-year smoking history. He has never used smokeless tobacco. He reports current alcohol use. He reports that he does not use drugs.  No Known Allergies  Family History  Problem Relation Age of Onset   Diabetes Mellitus II Mother    Hypertension Father     Prior to Admission medications   Medication Sig Start Date End Date Taking? Authorizing Provider  dextromethorphan-guaiFENesin (MUCINEX DM) 30-600 MG 12hr tablet Take 1 tablet by mouth 2 (two) times daily. 07/08/21  Yes Lorella Nimrod, MD  dorzolamide-timolol (COSOPT) 22.3-6.8 MG/ML ophthalmic solution Place 1 drop into both eyes 2 (two) times daily. 07/10/20  Yes [provider]  ipratropium-albuterol (DUONEB) 0.5-2.5 (3) MG/3ML SOLN Take 3 mLs by nebulization every 6 (six) hours as needed (Shortness of breath). 07/08/21  Yes Lorella Nimrod, MD  latanoprost (XALATAN) 0.005 % ophthalmic solution Place 1 drop into both eyes at bedtime. 07/10/20  Yes [provider]  TRELEGY ELLIPTA 100-62.5-25 MCG/ACT AEPB Inhale 1 puff into the lungs daily. 05/10/21  Yes [provider]  albuterol (VENTOLIN HFA) 108 (90  Base) MCG/ACT inhaler Inhale 2-4 puffs by mouth every 4 hours as needed for wheezing, cough, and/or shortness of breath 07/08/20   Hinda Kehr, MD  metoprolol tartrate (LOPRESSOR) 25 MG tablet Take 0.5 tablets (12.5 mg total) by mouth 2 (two) times daily. Patient not taking: Reported on 09/14/2021 07/08/21   Lorella Nimrod, MD    Physical Exam: Vitals:   09/14/21 0600 09/14/21 0630 09/14/21 0700 09/14/21 0730  BP: 94/70 110/87 (!) 150/81 123/78  Pulse: 73  61 70  Resp: 15 17 15 17   Temp:      TempSrc:      SpO2: 100%  100% 100%  Weight:      Height:       Physical Exam Vitals and nursing note reviewed.  Constitutional:      Appearance: He is normal weight.     Comments: Chronically ill-appearing  HENT:     Head: Normocephalic and atraumatic.     Mouth/Throat:     Mouth: Mucous membranes are moist.  Eyes:     Pupils: Pupils are equal, round, and reactive to light.  Cardiovascular:     Rate and Rhythm: Normal rate and regular rhythm.  Pulmonary:     Effort: Pulmonary effort is normal.     Breath sounds: Examination of the right-upper field reveals wheezing. Examination of the left-upper field reveals wheezing. Examination of the right-middle field reveals wheezing. Examination of the left-middle field reveals wheezing. Examination of the right-lower field reveals wheezing. Examination of the left-lower field reveals wheezing. Wheezing present.  Abdominal:     General: Bowel sounds are normal.     Palpations: Abdomen is soft.  Musculoskeletal:        General: Normal range of motion.     Cervical back: Normal range of motion and neck supple.  Skin:    General: Skin is warm and dry.  Neurological:     General: No focal deficit present.     Mental Status: He is alert and oriented to person, place, and time.  Psychiatric:        Mood and Affect: Mood normal.        Behavior: Behavior normal.     Data Reviewed: Relevant notes from primary care and specialist visits, past  discharge summaries as available in EHR, including Care Everywhere. Prior diagnostic testing as pertinent to current admission diagnoses Updated medications and problem lists for reconciliation ED course, including vitals, labs, imaging, treatment and response to treatment Triage notes, nursing and pharmacy notes and ED provider's notes Notable results as noted in HPI Labs reviewed.  White count 6.1, hemoglobin 11.6, hematocrit 37.2, MCV 77.5, chloride 96, bicarb 33, glucose 153, BNP 126, lactic acid 1.7 Chest x-ray reviewed by me shows stable emphysema and left perihilar scarring. No acute airspace disease. CT angiogram of the chest is negative for acute pulmonary embolus. Moderate to severe Emphysema (ICD10-J43.9). Retained secretions in the trachea at the thoracic inlet, and partially opacified posterior basal segment right lower lobe airways with plate like lower lobe opacity. Consider aspiration, bronchopneumonia, or atelectasis. Chronic scarring in both middle lobes. No pleural effusion or other acute pulmonary finding. Chronic but increased small volume perihepatic ascites since 2020. Otherwise  stable visible upper abdomen.Calcified coronary artery and Aortic Atherosclerosis Twelve-lead EKG reviewed by me shows sinus tachycardia with PACs There are no new results to review at this time.  Assessment and Plan: * Aspiration pneumonia [facility]) Patient presents to the ER for evaluation of chest pain and shortness of breath. He had a CT angiogram of the chest which showed retained secretions in the trachea at the thoracic inlet, and partially opacified posterior basal segment right lower lobe airways with platelike lower lobe opacity. Consider aspiration, bronchopneumonia, or atelectasis. Chronic scarring in both middle lobes. Will start patient on IV Unasyn Speech therapy consult for swallow function evaluation Consult pulmonary      Acute exacerbation of chronic obstructive pulmonary  disease (COPD) ([place]) Patient with a history of COPD who presents for evaluation of worsening shortness of breath and chest pain Noted to have diffuse wheezing upon arrival to the ER and received 1 dose of Solu-Medrol 125mg  IV We will place patient on Solu-Medrol 40 mg IV every 12 Continue bronchodilator therapy as well as inhaled steroids  Tobacco abuse Smoking cessation  was discussed with patient in detail We will place him on a nicotine transdermal patch       Advance Care Planning:   Code Status: Full Code   Consults: Pulmonary  Family Communication: Greater than 50% of time was spent discussing patient's condition and plan of care with him at the bedside.  All questions and concerns have been addressed.  He verbalizes understanding and agrees to the plan.  Severity of Illness: The appropriate patient status for this patient is INPATIENT. Inpatient status is judged to be reasonable and necessary in order to provide the required intensity of service to ensure the patient's safety. The patient's presenting symptoms, physical exam findings, and initial radiographic and laboratory data in the context of their chronic comorbidities is felt to place them at high risk for further clinical deterioration. Furthermore, it is not anticipated that the patient will be medically stable for discharge from the hospital within 2 midnights of admission.   * I certify that at the point of admission it is my clinical judgment that the patient will require inpatient hospital care spanning beyond 2 midnights from the point of admission due to high intensity of service, high risk for further deterioration and high frequency of surveillance required.*  Author: Collier Bullock, MD 09/14/2021 10:22 AM  For on call review [email or web address].

## 2021-09-15 LAB — CBC
HCT: 33.3 % — ABNORMAL LOW (ref 39.0–52.0)
Hemoglobin: 10.7 g/dL — ABNORMAL LOW (ref 13.0–17.0)
MCH: 23.9 pg — ABNORMAL LOW (ref 26.0–34.0)
MCHC: 32.1 g/dL (ref 30.0–36.0)
MCV: 74.5 fL — ABNORMAL LOW (ref 80.0–100.0)
Platelets: 161 10*3/uL (ref 150–400)
RBC: 4.47 MIL/uL (ref 4.22–5.81)
RDW: 15.4 % (ref 11.5–15.5)
WBC: 8.6 10*3/uL (ref 4.0–10.5)
nRBC: 0 % (ref 0.0–0.2)

## 2021-09-15 LAB — BASIC METABOLIC PANEL
Anion gap: 8 (ref 5–15)
BUN: 24 mg/dL — ABNORMAL HIGH (ref 8–23)
CO2: 29 mmol/L (ref 22–32)
Calcium: 8.3 mg/dL — ABNORMAL LOW (ref 8.9–10.3)
Chloride: 99 mmol/L (ref 98–111)
Creatinine, Ser: 0.73 mg/dL (ref 0.61–1.24)
GFR, Estimated: >60 mL/min (ref >60)
Glucose, Bld: 141 mg/dL — ABNORMAL HIGH (ref 70–99)
Potassium: 4.1 mmol/L (ref 3.5–5.1)
Sodium: 136 mmol/L (ref 135–145)

## 2021-09-15 MED ORDER — METHYLPREDNISOLONE SODIUM SUCC 40 MG IJ SOLR
40.0000 mg | Freq: Every day | INTRAMUSCULAR | Status: DC
  Administered 2021-09-16: 40 mg via INTRAVENOUS
  Filled 2021-09-15 (×2): qty 1

## 2021-09-15 NOTE — Progress Notes (Signed)
PROGRESS NOTE  Carl Bell EPP:[id number] DOB: 01/15/1951 DOA: 09/14/2021 PCP: System, Provider Not In  HPI/Recap of past 24 hours: Carl Bell is a 71 y.o. male with medical history significant for COPD, history of lung cancer post chemo and radiation therapy, nicotine dependence and hypertension who presents to [facility] ER via EMS for evaluation of worsening shortness of breath for the past 2 months and associated chest pain.  He was prescribed oxygen as needed but he has had to use it continuously over the last couple of months due to his worsening shortness of breath.  Work-up revealed aspiration pneumonia and acute exacerbation of COPD.  Started on IV antibiotics empirically and IV Solu-Medrol.  09/15/2021: Patient was seen and examined at his bedside.  States his breathing is improved.  Denies any anginal symptoms.   Assessment/Plan: Principal Problem:   Aspiration pneumonia (HCC) Active Problems:   Tobacco abuse   Acute exacerbation of chronic obstructive pulmonary disease (COPD) ([place])  Aspiration pneumonia ([place]), POA Patient presents to the ER for evaluation of chest pain and shortness of breath. He had a CT angiogram of the chest which showed retained secretions in the trachea at the thoracic inlet, and partially opacified posterior basal segment right lower lobe airways with platelike lower lobe opacity. Consider aspiration, bronchopneumonia, or atelectasis. Chronic scarring in both middle lobes. Continue IV Unasyn Speech therapy consult for swallow function evaluation Continue aspiration precautions.  Acute exacerbation of chronic obstructive pulmonary disease (COPD) ([place]) secondary to above. Noted to have diffuse wheezing upon arrival to the ER and received 1 dose of Solu-Medrol 125mg  IV Continue for now and wean off Solu-Medrol 40 mg IV every 12 Continue bronchodilator therapy as well as inhaled steroids   Tobacco abuse Smoking cessation  was discussed with patient in  detail We will place him on a nicotine transdermal patch Continue to encourage complete cessation of tobacco use.   Steroid-induced hyperglycemia Last hemoglobin A1c 5.3 on 08/03/2020 Monitor for now, avoid hypoglycemia   Anemia of chronic disease, microcytic Hemoglobin 10.7 with MCV of 74 Continue to monitor H&H No overt bleeding at this time  Physical debility PT OT to assess Fall precautions    Advance Care Planning:   Code Status: Full Code    Consults: Pulmonary   Family Communication: None at bedside.    Disposition Plan: Likely will discharge to home on 09/16/2021.   Consultants: None.   DVT prophylaxis: Subcu Lovenox daily.  Status is: Inpatient Patient requires at least 2 midnights for further evaluation and treatment of present condition.            Objective: Vitals:   09/14/21 2358 09/15/21 0450 09/15/21 0748 09/15/21 1145  BP: 103/71 114/74 105/67 106/75  Pulse: 75 73 82 79  Resp: 18 18 18 18   Temp: 98.3 F (36.8 C) 98.4 F (36.9 C) 97.8 F (36.6 C)   TempSrc:   Oral   SpO2: 100% 100% 100% 100%  Weight:      Height:        Intake/Output Summary (Last 24 hours) at 09/15/2021 1402 Last data filed at 09/15/2021 0500 Gross per 24 hour  Intake 460 ml  Output 400 ml  Net 60 ml   Filed Weights   09/13/21 2059  Weight: 72.6 kg    Exam:  General: 71 y.o. year-old male well developed well nourished in no acute distress.  Alert and oriented x3. Cardiovascular: Regular rate and rhythm with no rubs or gallops.  No thyromegaly  or JVD noted.   Respiratory: Mild wheezing noted.  Rales at bases.  Poor inspiratory effort.   Abdomen: Soft nontender nondistended with normal bowel sounds x4 quadrants. Musculoskeletal: No lower extremity edema bilaterally. Skin: No ulcerative lesions noted or rashes, Psychiatry: Mood is appropriate for condition and setting   Data Reviewed: CBC: Recent Labs  Lab 09/13/21 2101 09/15/21 0612  WBC 6.1 8.6   HGB 11.6* 10.7*  HCT 37.2* 33.3*  MCV 77.5* 74.5*  PLT 163 956   Basic Metabolic Panel: Recent Labs  Lab 09/13/21 2101 09/15/21 0612  NA 139 136  K 4.0 4.1  CL 96* 99  CO2 33* 29  GLUCOSE 153* 141*  BUN 19 24*  CREATININE 0.94 0.73  CALCIUM 9.3 8.3*   GFR: Estimated Creatinine Clearance: 85.7 mL/min (by C-G formula based on SCr of 0.73 mg/dL). Liver Function Tests: No results for input(s): AST, ALT, ALKPHOS, BILITOT, PROT, ALBUMIN in the last 168 hours. No results for input(s): LIPASE, AMYLASE in the last 168 hours. No results for input(s): AMMONIA in the last 168 hours. Coagulation Profile: No results for input(s): INR, PROTIME in the last 168 hours. Cardiac Enzymes: No results for input(s): CKTOTAL, CKMB, CKMBINDEX, TROPONINI in the last 168 hours. BNP (last 3 results) No results for input(s): PROBNP in the last 8760 hours. HbA1C: No results for input(s): HGBA1C in the last 72 hours. CBG: No results for input(s): GLUCAP in the last 168 hours. Lipid Profile: No results for input(s): CHOL, HDL, LDLCALC, TRIG, CHOLHDL, LDLDIRECT in the last 72 hours. Thyroid Function Tests: No results for input(s): TSH, T4TOTAL, FREET4, T3FREE, THYROIDAB in the last 72 hours. Anemia Panel: No results for input(s): VITAMINB12, FOLATE, FERRITIN, TIBC, IRON, RETICCTPCT in the last 72 hours. Urine analysis:    Component Value Date/Time   COLORURINE YELLOW (A) 12/24/2017 0914   APPEARANCEUR HAZY (A) 12/24/2017 0914   LABSPEC 1.019 12/24/2017 0914   PHURINE 5.0 12/24/2017 0914   GLUCOSEU NEGATIVE 12/24/2017 0914   HGBUR NEGATIVE 12/24/2017 0914   BILIRUBINUR NEGATIVE 12/24/2017 0914   KETONESUR 5 (A) 12/24/2017 0914   PROTEINUR NEGATIVE 12/24/2017 0914   NITRITE NEGATIVE 12/24/2017 0914   LEUKOCYTESUR SMALL (A) 12/24/2017 0914   Sepsis Labs: @LABRCNTIP (procalcitonin:4,lacticidven:4)  ) Recent Results (from the past 240 hour(s))  Blood culture (routine x 2)     Status: None  (Preliminary result)   Collection Time: 09/14/21  6:29 AM   Specimen: BLOOD  Result Value Ref Range Status   Specimen Description BLOOD BLOOD RIGHT FOREARM  Final   Special Requests   Final    BOTTLES DRAWN AEROBIC AND ANAEROBIC Blood Culture adequate volume   Culture   Final    NO GROWTH < 24 HOURS Performed at [facility], [address] [place], [place] [address]    Report Status PENDING  Incomplete  Blood culture (routine x 2)     Status: None (Preliminary result)   Collection Time: 09/14/21  6:29 AM   Specimen: BLOOD  Result Value Ref Range Status   Specimen Description BLOOD BLOOD LEFT FOREARM  Final   Special Requests   Final    BOTTLES DRAWN AEROBIC AND ANAEROBIC Blood Culture adequate volume   Culture   Final    NO GROWTH < 24 HOURS Performed at [facility], [address] [place],  [address]    Report Status PENDING  Incomplete  Resp Panel by RT-PCR (Flu A&B, Covid) Nasopharyngeal Swab     Status: None  Collection Time: 09/14/21  7:43 AM   Specimen: Nasopharyngeal Swab; Nasopharyngeal(NP) swabs in vial transport medium  Result Value Ref Range Status   SARS Coronavirus 2 by RT PCR NEGATIVE NEGATIVE Final    Comment: (NOTE) SARS-CoV-2 target nucleic acids are NOT DETECTED.  The SARS-CoV-2 RNA is generally detectable in upper respiratory specimens during the acute phase of infection. The lowest concentration of SARS-CoV-2 viral copies this assay can detect is 138 copies/mL. A negative result does not preclude SARS-Cov-2 infection and should not be used as the sole basis for treatment or other patient management decisions. A negative result may occur with  improper specimen collection/handling, submission of specimen other than nasopharyngeal swab, presence of viral mutation(s) within the areas targeted by this assay, and inadequate number of viral copies(<138 copies/mL). A negative result must be combined with clinical observations,  patient history, and epidemiological information. The expected result is Negative.  Fact Sheet for Patients:  EntrepreneurPulse.com.au  Fact Sheet for Healthcare Providers:  IncredibleEmployment.be  This test is no t yet approved or cleared by the [place] FDA and  has been authorized for detection and/or diagnosis of SARS-CoV-2 by FDA under an Emergency Use Authorization (EUA). This EUA will remain  in effect (meaning this test can be used) for the duration of the COVID-19 declaration under Section 564(b)(1) of the Act, 21 U.S.C.section 360bbb-3(b)(1), unless the authorization is terminated  or revoked sooner.       Influenza A by PCR NEGATIVE NEGATIVE Final   Influenza B by PCR NEGATIVE NEGATIVE Final    Comment: (NOTE) The Xpert Xpress SARS-CoV-2/FLU/RSV plus assay is intended as an aid in the diagnosis of influenza from Nasopharyngeal swab specimens and should not be used as a sole basis for treatment. Nasal washings and aspirates are unacceptable for Xpert Xpress SARS-CoV-2/FLU/RSV testing.  Fact Sheet for Patients: EntrepreneurPulse.com.au  Fact Sheet for Healthcare Providers: IncredibleEmployment.be  This test is not yet approved or cleared by the [place] FDA and has been authorized for detection and/or diagnosis of SARS-CoV-2 by FDA under an Emergency Use Authorization (EUA). This EUA will remain in effect (meaning this test can be used) for the duration of the COVID-19 declaration under Section 564(b)(1) of the Act, 21 U.S.C. section 360bbb-3(b)(1), unless the authorization is terminated or revoked.  Performed at [facility], [address] [place], [place] [address]       Studies: No results found.  Scheduled Meds:  dextromethorphan-guaiFENesin  1 tablet Oral BID   dorzolamide-timolol  1 drop Both Eyes BID   enoxaparin (LOVENOX) injection  40 mg Subcutaneous  Q24H   fluticasone furoate-vilanterol  1 puff Inhalation Daily   And   umeclidinium bromide  1 puff Inhalation Daily   latanoprost  1 drop Both Eyes QHS   methylPREDNISolone (SOLU-MEDROL) injection  40 mg Intravenous Q12H   nicotine  7 mg Transdermal Daily    Continuous Infusions:  ampicillin-sulbactam (UNASYN) IV 3 g (09/15/21 1246)     LOS: 1 day     Kayleen Memos, MD [facility] Pager [phone]  If 7PM-7AM, please contact night-coverage [email or web address] Password [email or web address] 09/15/2021, 2:02 PM

## 2021-09-15 NOTE — Plan of Care (Signed)
  Problem: Activity: Goal: Ability to tolerate increased activity will improve Outcome: Progressing   Problem: Clinical Measurements: Goal: Ability to maintain a body temperature in the normal range will improve Outcome: Progressing   Problem: Respiratory: Goal: Ability to maintain adequate ventilation will improve Outcome: Progressing Goal: Ability to maintain a clear airway will improve Outcome: Progressing   

## 2021-09-16 ENCOUNTER — Inpatient Hospital Stay: Payer: Medicare Other

## 2021-09-16 LAB — MAGNESIUM: Magnesium: 1.8 mg/dL (ref 1.7–2.4)

## 2021-09-16 LAB — COMPREHENSIVE METABOLIC PANEL
ALT: 8 U/L (ref 0–44)
AST: 12 U/L — ABNORMAL LOW (ref 15–41)
Albumin: 2.6 g/dL — ABNORMAL LOW (ref 3.5–5.0)
Alkaline Phosphatase: 36 U/L — ABNORMAL LOW (ref 38–126)
Anion gap: 6 (ref 5–15)
BUN: 27 mg/dL — ABNORMAL HIGH (ref 8–23)
CO2: 31 mmol/L (ref 22–32)
Calcium: 8.4 mg/dL — ABNORMAL LOW (ref 8.9–10.3)
Chloride: 102 mmol/L (ref 98–111)
Creatinine, Ser: 0.89 mg/dL (ref 0.61–1.24)
GFR, Estimated: >60 mL/min (ref >60)
Glucose, Bld: 107 mg/dL — ABNORMAL HIGH (ref 70–99)
Potassium: 3.9 mmol/L (ref 3.5–5.1)
Sodium: 139 mmol/L (ref 135–145)
Total Bilirubin: 0.3 mg/dL (ref 0.3–1.2)
Total Protein: 5.3 g/dL — ABNORMAL LOW (ref 6.5–8.1)

## 2021-09-16 LAB — CBC
HCT: 32.8 % — ABNORMAL LOW (ref 39.0–52.0)
Hemoglobin: 10.8 g/dL — ABNORMAL LOW (ref 13.0–17.0)
MCH: 24.3 pg — ABNORMAL LOW (ref 26.0–34.0)
MCHC: 32.9 g/dL (ref 30.0–36.0)
MCV: 73.7 fL — ABNORMAL LOW (ref 80.0–100.0)
Platelets: 177 10*3/uL (ref 150–400)
RBC: 4.45 MIL/uL (ref 4.22–5.81)
RDW: 15.1 % (ref 11.5–15.5)
WBC: 9.2 10*3/uL (ref 4.0–10.5)
nRBC: 0 % (ref 0.0–0.2)

## 2021-09-16 LAB — PROCALCITONIN: Procalcitonin: <0.1 ng/mL

## 2021-09-16 LAB — PHOSPHORUS: Phosphorus: 1.8 mg/dL — ABNORMAL LOW (ref 2.5–4.6)

## 2021-09-16 MED ORDER — PREDNISONE 10 MG PO TABS: 50.0000 mg | ORAL_TABLET | Freq: Every day | ORAL | 0 refills | Status: DC

## 2021-09-16 MED ORDER — AMOXICILLIN-POT CLAVULANATE 875-125 MG PO TABS
1.0000 | ORAL_TABLET | Freq: Two times a day (BID) | ORAL | Status: DC
Start: 2021-09-16 — End: 2021-09-17
  Administered 2021-09-16 – 2021-09-17 (×2): 1 via ORAL
  Filled 2021-09-16 (×2): qty 1

## 2021-09-16 MED ORDER — SODIUM PHOSPHATES 45 MMOLE/15ML IV SOLN
30.0000 mmol | Freq: Once | INTRAVENOUS | Status: AC
  Administered 2021-09-16: 30 mmol via INTRAVENOUS
  Filled 2021-09-16: qty 10

## 2021-09-16 MED ORDER — NICOTINE 7 MG/24HR TD PT24
7.0000 mg | MEDICATED_PATCH | Freq: Every day | TRANSDERMAL | 0 refills | Status: DC
Start: 2021-09-16 — End: 2022-07-18

## 2021-09-16 MED ORDER — ORAL CARE MOUTH RINSE
15.0000 mL | Freq: Two times a day (BID) | OROMUCOSAL | Status: DC
  Administered 2021-09-16 – 2021-09-17 (×3): 15 mL via OROMUCOSAL

## 2021-09-16 NOTE — Progress Notes (Signed)
Patient was unable to get any family members to bring his o2 and pick him up for discharge.  I attempted to call sister and got no answer.  Called his mom and she also said she could not get anyone to come out tonight to get him.  Notified charge nurses, Department Director and MD that no one was able to pick up patient today.      ?

## 2021-09-16 NOTE — TOC Initial Note (Addendum)
Transition of Care (TOC) - Initial/Assessment Note  ? ? ?Patient Details  ?Name: Carl Bell ?MRN: [id number] ?Date of Birth: [birth year] ? ?Transition of Care (TOC) CM/SW Contact:    ?Pete Pelt, RN ?Phone Number: ?09/16/2021, 11:22 AM ? ?Clinical Narrative:   Patient states he lives at home with his mother.  He has no concerns about returning home at this time.  He states that his only concerns about medications include receiving a detailed explanation about them.  Message given to care team, and also discussed that patient can always call PCP or pharmacist regarding questions. ? ?3n1 ordered, patient still pending PT evaluation for further equipment needs.  TOC to follow.              ? ?Addendum 03/06:  Patient states home oxygen is functioning properly.  Patient has 3n1 at bedside, no additional recommendations from PT.  Patient will pick up oximeter, as [employer] does not have them in stock.  [facility] per [place]. ? ?Expected Discharge Plan: [place] ?Barriers to Discharge: Continued Medical Work up ? ? ?Patient Goals and CMS Choice ?Patient states their goals for this hospitalization and ongoing recovery are:: To go home and feel better ?  ?Choice offered to / list presented to : NA ? ?Expected Discharge Plan and Services ?Expected Discharge Plan: [place] ?  ?  ?Post Acute Care Choice: Durable Medical Equipment, Home Health ?Living arrangements for the past 2 months: [place] ?Expected Discharge Date: 09/16/21               ?  ?  ?  ?  ?  ?HH Arranged: RN, OT, PT ?[place] Agency: [place] ([place]) ?Date HH Agency Contacted: 09/16/21 ?Time [place]: 1120 ?Representative spoke with at [place]: Floydene Flock ? ?Prior Living Arrangements/Services ?Living arrangements for the past 2 months: [place] ?Lives with:: Self, Parents ?Patient language and need for interpreter reviewed:: Yes (No interpreter required) ?Do  you feel safe going back to the place where you live?: Yes      ?Need for Family Participation in Patient Care: Yes (Comment) ?Care giver support system in place?: Yes (comment) ?Current home services: DME (Home oxygen) ?Criminal Activity/Legal Involvement Pertinent to Current Situation/Hospitalization: No - Comment as needed ? ?Activities of Daily Living ?Home Assistive Devices/Equipment: None ?ADL Screening (condition at time of admission) ?Patient's cognitive ability adequate to safely complete daily activities?: No ?Is the patient deaf or have difficulty hearing?: No ?Does the patient have difficulty seeing, even when wearing glasses/contacts?: No ?Does the patient have difficulty concentrating, remembering, or making decisions?: No ?Patient able to express need for assistance with ADLs?: Yes ?Does the patient have difficulty dressing or bathing?: Yes ?Independently performs ADLs?: No ?Does the patient have difficulty walking or climbing stairs?: Yes ?Weakness of Legs: Both ?Weakness of Arms/Hands: None ? ?Permission Sought/Granted ?Permission sought to share information with : Case Manager ?Permission granted to share information with : Yes, Verbal Permission Granted ?   ? Permission granted to share info w AGENCY: [place] ?   ?   ? ?Emotional Assessment ?Appearance:: Appears stated age ?Attitude/Demeanor/Rapport: Van Clines ?Affect (typically observed): Pleasant, Appropriate ?Orientation: : Oriented to Self, Oriented to Place, Oriented to Situation, Oriented to  Time ?Alcohol / Substance Use: Not Applicable ?Psych Involvement: No (comment) ? ?Admission diagnosis:  Aspiration pneumonia ([place]) [J69.0] ?Acute on chronic respiratory failure with hypoxia (  [place]) [J96.21] ?Aspiration pneumonia, unspecified aspiration pneumonia type, unspecified laterality, unspecified part of lung ([place]) [J69.0] ?Patient Active Problem List  ? Diagnosis Date Noted  ? Aspiration pneumonia ([place]) 09/14/2021  ? COVID  08/04/2020  ? Demand ischemia ([place]) 08/04/2020  ? SOB (shortness of breath) 08/04/2020  ? COPD exacerbation ([place]) 11/08/2019  ? Acute exacerbation of chronic obstructive pulmonary disease (COPD) ([place]) 10/18/2019  ? Hypertensive urgency 10/17/2019  ? COPD with acute exacerbation ([place]) 09/12/2019  ? Acute on chronic respiratory failure with hypoxia and hypercapnia ([place]) 09/12/2019  ? Tobacco abuse 09/12/2019  ? PAD (peripheral artery disease) ([place]) 09/12/2019  ? HLD (hyperlipidemia) 09/12/2019  ? Atherosclerosis of native arteries of the extremities with ulceration ([place]) 06/28/2019  ? Sepsis ([place]) 12/24/2017  ? Protein-calorie malnutrition, severe 11/12/2017  ? Acute respiratory failure ([place]) 11/10/2017  ? Acute respiratory distress 12/22/2015  ? COPD (chronic obstructive pulmonary disease) ([place]) 12/22/2015  ? Acute bronchitis 12/22/2015  ? HTN, goal below 140/80 12/22/2015  ? Lung cancer, lingula ([place]) 09/23/2015  ? Chronic edema 09/15/2014  ? ?PCP:  System, Provider Not In ?Pharmacy:   ?[facility] [address] ?[place] [address] ?Phone: [phone] Fax: [phone] ? ? ? ? ?Social Determinants of Health (SDOH) Interventions ?  ? ?Readmission Risk Interventions ?No flowsheet data found. ? ? ?

## 2021-09-16 NOTE — Final Progress Note (Signed)
Pulmonary Medicine          Date: 09/16/2021,   MRN# [id number] SURAJ RAMDASS 08/12/1950      HISTORY OF PRESENT ILLNESS   He is having no respiratory distress, voices he is feeling much better, no significnt cough, fever or swallowing difficulty. He is going home today   PAST MEDICAL HISTORY   Past Medical History:  Diagnosis Date   COPD (chronic obstructive pulmonary disease) ([place])    Emphysema of lung ([place])    Glaucoma    Hypertension    Lung cancer ([place])      SURGICAL HISTORY   Past Surgical History:  Procedure Laterality Date   LOWER EXTREMITY ANGIOGRAPHY Left 07/11/2019   Procedure: LOWER EXTREMITY ANGIOGRAPHY;  Surgeon: Algernon Huxley, MD;  Location: [facility];  Service: Cardiovascular;  Laterality: Left;   LUNG BIOPSY     PERIPHERAL VASCULAR CATHETERIZATION N/A 09/24/2015   Procedure: [name];  Surgeon: Algernon Huxley, MD;  Location: [facility];  Service: Cardiovascular;  Laterality: N/A;   Port a cath placement       FAMILY HISTORY   Family History  Problem Relation Age of Onset   Diabetes Mellitus II Mother    Hypertension Father      SOCIAL HISTORY   Social History   Tobacco Use   Smoking status: Every Day    Packs/day: 0.50    Years: 50.00    Pack years: 25.00    Types: Cigarettes   Smokeless tobacco: Never  Vaping Use   Vaping Use: Never used  Substance Use Topics   Alcohol use: Yes   Drug use: No     MEDICATIONS    Home Medication:  Current Outpatient Rx   Order #: [id number] Class: Normal   Order #: [id number] Class: Normal    Current Medication:  Current Facility-Administered Medications:    acetaminophen (TYLENOL) tablet 650 mg, 650 mg, Oral, Q6H PRN **OR** acetaminophen (TYLENOL) suppository 650 mg, 650 mg, Rectal, Q6H PRN, Agbata, Tochukwu, MD   acetaminophen (TYLENOL) tablet 650 mg, 650 mg, Oral, Q6H PRN, Athena Masse, MD   Ampicillin-Sulbactam (UNASYN) 3 g in sodium chloride 0.9 %  100 mL IVPB, 3 g, Intravenous, Q6H, Agbata, Tochukwu, MD, Last Rate: 200 mL/hr at 09/16/21 0619, 3 g at 09/16/21 6195   dextromethorphan-guaiFENesin (MUCINEX DM) 30-600 MG per 12 hr tablet 1 tablet, 1 tablet, Oral, BID, Agbata, Tochukwu, MD, 1 tablet at 09/16/21 0935   dorzolamide-timolol (COSOPT) 22.3-6.8 MG/ML ophthalmic solution 1 drop, 1 drop, Both Eyes, BID, Agbata, Tochukwu, MD, 1 drop at 09/16/21 0936   enoxaparin (LOVENOX) injection 40 mg, 40 mg, Subcutaneous, Q24H, Agbata, Tochukwu, MD, 40 mg at 09/15/21 2117   fluticasone furoate-vilanterol (BREO ELLIPTA) 100-25 MCG/ACT 1 puff, 1 puff, Inhalation, Daily, 1 puff at 09/16/21 0937 **AND** umeclidinium bromide (INCRUSE ELLIPTA) 62.5 MCG/ACT 1 puff, 1 puff, Inhalation, Daily, Agbata, Tochukwu, MD, 1 puff at 09/16/21 0938   ipratropium-albuterol (DUONEB) 0.5-2.5 (3) MG/3ML nebulizer solution 3 mL, 3 mL, Nebulization, Q4H PRN, Damita Dunnings, Hazel V, MD   latanoprost (XALATAN) 0.005 % ophthalmic solution 1 drop, 1 drop, Both Eyes, QHS, Agbata, Tochukwu, MD, 1 drop at 09/15/21 2117   MEDLINE mouth rinse, 15 mL, Mouth Rinse, BID, Hall, Carole N, DO, 15 mL at 09/16/21 0932   methylPREDNISolone sodium succinate (SOLU-MEDROL) 40 mg/mL injection 40 mg, 40 mg, Intravenous, Daily, Hall, Carole N, DO, 40 mg at 09/16/21 0936   nicotine (NICODERM CQ -  dosed in mg/24 hr) patch 7 mg, 7 mg, Transdermal, Daily, Agbata, Tochukwu, MD   ondansetron (ZOFRAN) tablet 4 mg, 4 mg, Oral, Q6H PRN **OR** ondansetron (ZOFRAN) injection 4 mg, 4 mg, Intravenous, Q6H PRN, Agbata, Tochukwu, MD    ALLERGIES   Patient has no known allergies.     REVIEW OF SYSTEMS    Review of Systems:  Gen:  Denies  fever, sweats, chills weigh loss  HEENT: Denies blurred vision, double vision, ear pain, eye pain, hearing loss, nose bleeds, sore throat Cardiac:  No dizziness, chest pain or heaviness, chest tightness,edema Resp:   less cough or sputum porduction, no shortness of  breath,wheezing, hemoptysis,  Gi: Denies swallowing difficulty, stomach pain, nausea or vomiting, diarrhea, constipation, bowel incontinence Gu:  Denies bladder incontinence, burning urine Ext:   Denies Joint pain, stiffness or swelling Skin: Denies  skin rash, easy bruising or bleeding or hives Endoc:  Denies polyuria, polydipsia , polyphagia or weight change Psych:   Denies depression, insomnia or hallucinations   Other:  All other systems negative   VS: BP (!) 143/89 (BP Location: Left Wrist)    Pulse 74    Temp 97.9 F (36.6 C) (Oral)    Resp 16    Ht 6\' 1"  (1.854 m)    Wt 72.6 kg    SpO2 100%    BMI 21.11 kg/m      PHYSICAL EXAM    GENERAL:NAD, no fevers, chills, no weakness no fatigue. Awaiting d/c HEAD: Normocephalic, atraumatic.  EYES: Pupils equal, round, reactive to light. Extraocular muscles intact. No scleral icterus.  MOUTH: Moist mucosal membrane. Dentition intact. No abscess noted.  EAR, NOSE, THROAT: Clear without exudates. No external lesions.  NECK: Supple. No thyromegaly. No nodules. No JVD.  PULMONARY: no wheezing or rub, no use of accessory muscles CARDIOVASCULAR: S1 and S2. Regular rate and rhythm. No murmurs, rubs, or gallops. No edema. Pedal pulses 2+ bilaterally.  GASTROINTESTINAL: Soft, nontender, nondistended. No masses. Positive bowel sounds. No hepatosplenomegaly.  MUSCULOSKELETAL: No swelling, clubbing, or edema. Range of motion full in all extremities.  NEUROLOGIC: Cranial nerves II through XII are intact. No gross focal neurological deficits. Sensation intact. Reflexes intact.  SKIN: No ulceration, lesions, rashes, or cyanosis. Skin warm and dry. Turgor intact.  PSYCHIATRIC: Mood, affect within normal limits. The patient is awake, alert and oriented x 3. Insight, judgment intact.       IMAGING    DG Chest 2 View  Result Date: 09/13/2021 CLINICAL DATA:  Worsening shortness of breath for several months, increasing oxygen requirement, COPD EXAM:  CHEST - 2 VIEW COMPARISON:  07/07/2021 FINDINGS: Frontal and lateral views of the chest demonstrates stable right chest wall port. Cardiac silhouette is unremarkable. Chronic left lung scarring. Background emphysema unchanged. No airspace disease, effusion, or pneumothorax. IMPRESSION: 1. Stable emphysema and left perihilar scarring. No acute airspace disease. Electronically Signed   By: Randa Ngo M.D.   On: 09/13/2021 21:19   CT Angio Chest PE W and/or Wo Contrast  Result Date: 09/14/2021 CLINICAL DATA:  71 year old male with increasing shortness of breath. Constant oxygen requirement now. History of treated right middle lobe lung cancer. EXAM: CT ANGIOGRAPHY CHEST WITH CONTRAST TECHNIQUE: Multidetector CT imaging of the chest was performed using the standard protocol during bolus administration of intravenous contrast. Multiplanar CT image reconstructions and MIPs were obtained to evaluate the vascular anatomy. RADIATION DOSE REDUCTION: This exam was performed according to the departmental dose-optimization program which includes automated exposure control, adjustment  of the mA and/or kV according to patient size and/or use of iterative reconstruction technique. CONTRAST:  175mL OMNIPAQUE IOHEXOL 350 MG/ML SOLN COMPARISON:  Chest radiographs 09/13/2021 and earlier. Chest CTA 07/23/2017. restaging chest CT 05/20/2019. FINDINGS: Cardiovascular: Excellent contrast bolus timing in the pulmonary arterial tree. No focal filling defect identified in the pulmonary arteries to suggest acute pulmonary embolism. Calcified aortic atherosclerosis. Stable tortuosity of the thoracic aorta. No cardiomegaly. Small, probably physiologic pericardial effusion is stable since 2020. Calcified coronary artery atherosclerosis. Little contrast in the aorta. Bilateral proximal great vessel calcified atherosclerosis. Right chest Port-A-Cath in place. Mediastinum/Nodes: Negative. No mediastinal mass or lymphadenopathy. Lungs/Pleura:  Chronic emphysema is moderate to severe. Retained secretions in the trachea at the thoracic inlet on series 6, image 5. But other central airways remain patent. There is new partial opacification of left lower lobe airways (series 6, image 80) with platelike new posterior basal segment right lower lobe opacity on series 6, image 90. Patchy opacity continues to the posterior right costophrenic angle. Chronic architectural distortion in both the lingula and along the right major fissure are stable since 2020. No pleural effusion or other acute pulmonary opacity. Upper Abdomen: Small volume perihepatic ascites is chronic but increased since 2020. Chronic calcific pancreatitis partially visible. Partially calcified exophytic roughly 3 cm left renal midpole lesion appears grossly unchanged from 02/28/2016 PET-CT, and was negative for hypermetabolism at that time. Negative other visible upper abdominal viscera including adrenal glands. Musculoskeletal: No acute or suspicious osseous lesion Review of the MIP images confirms the above findings. IMPRESSION: 1. Negative for acute pulmonary embolus. 2. Moderate to severe Emphysema (ICD10-J43.9). Retained secretions in the trachea at the thoracic inlet, and partially opacified posterior basal segment right lower lobe airways with platelike lower lobe opacity. Consider aspiration, bronchopneumonia, or atelectasis. Chronic scarring in both middle lobes. No pleural effusion or other acute pulmonary finding. 3. Chronic but increased small volume perihepatic ascites since 2020. Otherwise stable visible upper abdomen. 4. Calcified coronary artery and Aortic Atherosclerosis (ICD10-I70.0). Electronically Signed   By: Genevie Ann M.D.   On: 09/14/2021 05:33   DG Swallowing Func-Speech Pathology  Result Date: 09/16/2021 Table formatting from the original result was not included. Objective Swallowing Evaluation: Type of Study: MBS-Modified Barium Swallow Study  Patient Details Name: OSIRIS ODRISCOLL MRN: [id number] Date of Birth: [birth year] Today's Date: 09/16/2021 Time: SLP Start Time (ACUTE ONLY): 0830 -SLP Stop Time (ACUTE ONLY): 0850 SLP Time Calculation (min) (ACUTE ONLY): 20 min Past Medical History: Past Medical History: Diagnosis Date  COPD (chronic obstructive pulmonary disease) ([place])   Emphysema of lung ([place])   Glaucoma   Hypertension   Lung cancer ([place])  Past Surgical History: Past Surgical History: Procedure Laterality Date  LOWER EXTREMITY ANGIOGRAPHY Left 07/11/2019  Procedure: LOWER EXTREMITY ANGIOGRAPHY;  Surgeon: Algernon Huxley, MD;  Location: [facility];  Service: Cardiovascular;  Laterality: Left;  LUNG BIOPSY    PERIPHERAL VASCULAR CATHETERIZATION N/A 09/24/2015  Procedure: [name];  Surgeon: Algernon Huxley, MD;  Location: [facility];  Service: Cardiovascular;  Laterality: N/A;  Port a cath placement   HPI: CHAYANNE SPEIR is a 71 y.o. male with medical history significant for COPD with chronic respiratory failure, history of lung cancer post chemo and radiation therapy, nicotine dependence and hypertension who presents to the ER via EMS for evaluation of worsening shortness of breath from his baseline associated with chest pain. CT angiogram of the chest which showed moderate to  severe Emphysema. Retained secretions in the trachea at the thoracic inlet, and partially opacified posterior basal segment right lower lobe airways with plate like lower lobe opacity. Consider aspiration, bronchopneumonia, or atelectasis. Chronic scarring in both middle lobes. No pleural effusion or other acute pulmonary finding.  Subjective: pt pleasant, remembers this writer from previous evaluation over the weekend  Recommendations for follow up therapy are one component of a multi-disciplinary discharge planning process, led by the attending physician.  Recommendations may be updated based on patient status, additional functional criteria and insurance authorization. Assessment /  Plan / Recommendation Clinical Impressions 09/16/2021 Clinical Impression Pt presents with adequate oropharyngeal abilities when consuming thin liquids via cup, puree, graham crackers in puree and whole barium tablet with puree. Pt demonstrates effective oral abilities, timely swallow initiation and achieves good glottal closure. When consuming the barium tablet whole with thin liquids, the tablet had difficulty transiting thru the pharyngeal but when administered a bolus of applesauce, the tablet cleared effectively. At this time, it appears that pt is safe when consuming a regular diet with thin liquids via cup, medicine whole with puree. SLP Visit Diagnosis Dysphagia, oropharyngeal phase (R13.12) Attention and concentration deficit following -- Frontal lobe and executive function deficit following -- Impact on safety and function Mild aspiration risk   Treatment Recommendations 09/16/2021 Treatment Recommendations No treatment recommended at this time   No flowsheet data found. Diet Recommendations 09/16/2021 SLP Diet Recommendations Regular solids;Thin liquid Liquid Administration via Cup Medication Administration Whole meds with puree Compensations Minimize environmental distractions;Slow rate;Small sips/bites Postural Changes Seated upright at 90 degrees;Remain semi-upright after after feeds/meals (Comment)   Other Recommendations 09/16/2021 Recommended Consults -- Oral Care Recommendations Oral care BID Other Recommendations -- Follow Up Recommendations No SLP follow up Assistance recommended at discharge None Functional Status Assessment -- Frequency and Duration  09/14/2021 Speech Therapy Frequency (ACUTE ONLY) min 2x/week Treatment Duration 2 weeks   Oral Phase 09/16/2021 Oral Phase WFL Oral - Pudding Teaspoon -- Oral - Pudding Cup -- Oral - Honey Teaspoon -- Oral - Honey Cup -- Oral - Nectar Teaspoon -- Oral - Nectar Cup -- Oral - Nectar Straw -- Oral - Thin Teaspoon -- Oral - Thin Cup -- Oral - Thin Straw -- Oral -  Puree -- Oral - Mech Soft -- Oral - Regular -- Oral - Multi-Consistency -- Oral - Pill -- Oral Phase - Comment --  Pharyngeal Phase 09/16/2021 Pharyngeal Phase WFL Pharyngeal- Pudding Teaspoon -- Pharyngeal -- Pharyngeal- Pudding Cup -- Pharyngeal -- Pharyngeal- Honey Teaspoon -- Pharyngeal -- Pharyngeal- Honey Cup -- Pharyngeal -- Pharyngeal- Nectar Teaspoon -- Pharyngeal -- Pharyngeal- Nectar Cup -- Pharyngeal -- Pharyngeal- Nectar Straw -- Pharyngeal -- Pharyngeal- Thin Teaspoon -- Pharyngeal -- Pharyngeal- Thin Cup -- Pharyngeal -- Pharyngeal- Thin Straw -- Pharyngeal -- Pharyngeal- Puree -- Pharyngeal -- Pharyngeal- Mechanical Soft -- Pharyngeal -- Pharyngeal- Regular -- Pharyngeal -- Pharyngeal- Multi-consistency -- Pharyngeal -- Pharyngeal- Pill -- Pharyngeal -- Pharyngeal Comment --  Cervical Esophageal Phase  09/16/2021 Cervical Esophageal Phase WFL Pudding Teaspoon -- Pudding Cup -- Honey Teaspoon -- Honey Cup -- Nectar Teaspoon -- Nectar Cup -- Nectar Straw -- Thin Teaspoon -- Thin Cup -- Thin Straw -- Puree -- Mechanical Soft -- Regular -- Multi-consistency -- Pill -- Cervical Esophageal Comment -- Happi B. Rutherford Nail M.S., CCC-SLP, [place] Office [phone] Stormy Fabian 09/16/2021, 12:54 PM  ASSESSMENT/PLAN   Pulmonary problems 1 RLL pneumonia. Suspect aspiration, responded clinically well, ok to go home -anti aspiration measures ( chew well, small bites) -[place] 500 mg q 12 hrs x 5 days-flutter valve ( use 4-5 times q shift)   2 copd. His chest points to significant involvement -advised to stop smoking -agree with trelegy and albuterol -close f/u on ocular sxs. Hx of glucoma --out patient f/u in pulmonary clinic and pfts   3 phx hx of lung cancer, s/p chemo and xrt. In remission -out patient continued f/u. -scarring seen may be due to xrt.      Thank you for allowing me to participate in the care of this patient.    Patient/Family are satisfied with care plan and all questions have been answered.  This document was prepared using Dragon voice recognition software and may include unintentional dictation errors.     Wallene [place], M.D.  Division of [place]

## 2021-09-16 NOTE — Evaluation (Signed)
Physical Therapy Evaluation ?Patient Details ?Name: Carl Bell ?MRN: [id number] ?DOB: 19-Nov-1950 ?Today's Date: 09/16/2021 ? ?History of Present Illness ? Carl Bell is a 71 y.o. male with medical history significant for COPD with chronic respiratory failure, history of lung cancer post chemo and radiation therapy, nicotine dependence and hypertension who presents to the ER via EMS for evaluation of worsening shortness of breath from his baseline associated with chest pain. CT angiogram of the chest which showed moderate to severe Emphysema.  ?Clinical Impression ? Pt is a pleasant 71 year old male who was admitted for aspiration pneumonia. Pt performs bed mobility with mod I, transfers with supervision, and ambulation with cga using HHA. Pt demonstrates deficits with endurance/vision. HHA given due to low vision and high falls environment. Pt reports he is typically a household ambulator and has no issues at home. Does become SOB with gait on RA. Pt is close to baseline level. Would benefit from skilled PT to address above deficits and promote optimal return to PLOF. Recommend transition to [facility] upon discharge from acute hospitalization.  ?SaO2 on room air at rest = 69% ?SaO2 on room air while ambulating = n/a ?SaO2 on 4 liters of O2 while ambulating = 96%  ? ? ?   ? ?Recommendations for follow up therapy are one component of a multi-disciplinary discharge planning process, led by the attending physician.  Recommendations may be updated based on patient status, additional functional criteria and insurance authorization. ? ?Follow Up Recommendations Home health PT ? ?  ?Assistance Recommended at Discharge Set up Supervision/Assistance  ?Patient can return home with the following ? A little help with walking and/or transfers ? ?  ?Equipment Recommendations None recommended by PT  ?Recommendations for Other Services ?    ?  ?Functional Status Assessment Patient has had a recent decline in their functional status  and demonstrates the ability to make significant improvements in function in a reasonable and predictable amount of time.  ? ?  ?Precautions / Restrictions Precautions ?Precautions: None ?Restrictions ?Weight Bearing Restrictions: No  ? ?  ? ?Mobility ? Bed Mobility ?Overal bed mobility: Modified Independent ?  ?  ?  ?  ?  ?  ?General bed mobility comments: no physical assist required, although is limited by low vision with guidance needed ?  ? ?Transfers ?Overall transfer level: Needs assistance ?Equipment used: 1 person hand held assist ?Transfers: Sit to/from Stand ?Sit to Stand: Supervision ?  ?  ?  ?  ?  ?General transfer comment: safe technique with pt able to perform upright posture. HHA given due to low vision ?  ? ?Ambulation/Gait ?Ambulation/Gait assistance: Min guard ?Gait Distance (Feet): 40 Feet ?Assistive device: 1 person hand held assist ?Gait Pattern/deviations: Step-to pattern ?  ?  ?  ?General Gait Details: due to low vision, needs HHA and verbal cues to navigate room and obstacles. Follows commands well. All mobility performed on 4L of O2 with sats at 96% ? ?Stairs ?  ?  ?  ?  ?  ? ?Wheelchair Mobility ?  ? ?Modified Rankin (Stroke Patients Only) ?  ? ?  ? ?Balance Overall balance assessment: Needs assistance ?Sitting-balance support: No upper extremity supported, Feet supported ?Sitting balance-Leahy Scale: Normal ?  ?  ?Standing balance support: No upper extremity supported, During functional activity ?Standing balance-Leahy Scale: Good ?Standing balance comment: limited by vision ?  ?  ?  ?  ?  ?  ?  ?  ?  ?  ?  ?   ? ? ? ?  Pertinent Vitals/Pain Pain Assessment ?Pain Assessment: No/denies pain  ? ? ?Home Living Family/patient expects to be discharged to:: Private residence ?Living Arrangements: Parent ?Available Help at Discharge: Family ?Type of Home: House ?Home Access: Stairs to enter ?Entrance Stairs-Rails: Left;Right ?Entrance Stairs-Number of Steps: 4 ?  ?Home Layout: One level ?   ?Additional Comments: lives with 78yo mother who has aids to assist her  ?  ?Prior Function Prior Level of Function : Independent/Modified Independent ?  ?  ?  ?  ?  ?  ?Mobility Comments: household ambulator ?ADLs Comments: limited by low vision ?  ? ? ?Hand Dominance  ? Dominant Hand: Right ? ?  ?Extremity/Trunk Assessment  ? Upper Extremity Assessment ?Upper Extremity Assessment: Overall WFL for tasks assessed ?  ? ?Lower Extremity Assessment ?Lower Extremity Assessment: Overall WFL for tasks assessed ?  ? ?   ?Communication  ? Communication: No difficulties  ?Cognition Arousal/Alertness: Awake/alert ?Behavior During Therapy: [facility] for tasks assessed/performed ?Overall Cognitive Status: Within Functional Limits for tasks assessed ?  ?  ?  ?  ?  ?  ?  ?  ?  ?  ?  ?  ?  ?  ?  ?  ?  ?  ?  ? ?  ?General Comments General comments (skin integrity, edema, etc.): pt denies SoB, pulse ox unable to read, left with PT to complete O2 assessment ? ?  ?Exercises    ? ?Assessment/Plan  ?  ?PT Assessment Patient needs continued PT services  ?PT Problem List Decreased activity tolerance;Decreased mobility;Cardiopulmonary status limiting activity ? ?   ?  ?PT Treatment Interventions Gait training;Balance training   ? ?PT Goals (Current goals can be found in the Care Plan section)  ?Acute Rehab PT Goals ?Patient Stated Goal: to go home ?PT Goal Formulation: With patient ?Time For Goal Achievement: 09/30/21 ?Potential to Achieve Goals: Good ? ?  ?Frequency Min 2X/week ?  ? ? ?Co-evaluation   ?  ?  ?  ?  ? ? ?  ?AM-PAC PT "6 Clicks" Mobility  ?Outcome Measure Help needed turning from your back to your side while in a flat bed without using bedrails?: None ?Help needed moving from lying on your back to sitting on the side of a flat bed without using bedrails?: None ?Help needed moving to and from a bed to a chair (including a wheelchair)?: None ?Help needed standing up from a chair using your arms (e.g., wheelchair or bedside chair)?:  A Little ?Help needed to walk in hospital room?: A Little ?Help needed climbing 3-5 steps with a railing? : A Little ?6 Click Score: 21 ? ?  ?End of Session   ?Activity Tolerance: Patient tolerated treatment well ?Patient left: in bed;with nursing/sitter in room ?Nurse Communication: Mobility status ?PT Visit Diagnosis: Unsteadiness on feet (R26.81) ?  ? ?Time: 5537-4827 ?PT Time Calculation (min) (ACUTE ONLY): 12 min ? ? ?Charges:   PT Evaluation ?$PT Eval Low Complexity: 1 Low ?  ?  ?   ? ? ?[name], PT, DPT, GCS ?[phone] ? ? ?Antonetta Clanton ?09/16/2021, 12:47 PM ? ?

## 2021-09-16 NOTE — Evaluation (Signed)
Occupational Therapy Evaluation ?Patient Details ?Name: LUISANTONIO ADINOLFI ?MRN: [id number] ?DOB: 1951-06-30 ?Today's Date: 09/16/2021 ? ? ?History of Present Illness ACEL NATZKE is a 71 y.o. male with medical history significant for COPD with chronic respiratory failure, history of lung cancer post chemo and radiation therapy, nicotine dependence and hypertension who presents to the ER via EMS for evaluation of worsening shortness of breath from his baseline associated with chest pain. CT angiogram of the chest which showed moderate to severe Emphysema.  ? ?Clinical Impression ?  ?Mr Delpilar was seen for OT evaluation this date. Prior to hospital admission, pt was Independent for limited household mobility. Pt lives with his 36yo mother in home c 4 STE. Pt reports low vision. Pt presents to acute OT demonstrating impaired ADL performance and functional mobility 2/2 decreased activity tolerance and functional ROM/balance deficits. Pt currently requires SUPERVISION + SETUP for toileting using urinal in standing - assist to locate urinal 2/2 low vision. MIN A don B socks at bed level, assist for threading over toes. MIN A don jacket at bed level. Pt will benefit from familiar environment as he is primarily limited by baseline visual deficits and novel environment. Pt would benefit from skilled OT to address noted impairments and functional limitations (see below for any additional details). Upon hospital discharge, recommend HHOT to maximize pt safety and return to PLOF. ?   ? ?Recommendations for follow up therapy are one component of a multi-disciplinary discharge planning process, led by the attending physician.  Recommendations may be updated based on patient status, additional functional criteria and insurance authorization.  ? ?Follow Up Recommendations ? Home health OT  ?  ?Assistance Recommended at Discharge Set up Supervision/Assistance  ?Patient can return home with the following A little help with walking and/or  transfers;A little help with bathing/dressing/bathroom;Help with stairs or ramp for entrance ? ?  ?Functional Status Assessment ? Patient has had a recent decline in their functional status and demonstrates the ability to make significant improvements in function in a reasonable and predictable amount of time.  ?Equipment Recommendations ? BSC/3in1  ?  ?Recommendations for Other Services   ? ? ?  ?Precautions / Restrictions Precautions ?Precautions: None ?Restrictions ?Weight Bearing Restrictions: No  ? ?  ? ?Mobility Bed Mobility ?Overal bed mobility: Modified Independent ?  ?  ?  ?  ?  ?  ?General bed mobility comments: HOB elevated ?  ? ?Transfers ?Overall transfer level: Needs assistance ?Equipment used: None ?Transfers: Sit to/from Stand ?Sit to Stand: Supervision ?  ?  ?  ?  ?  ?  ?  ? ?  ?Balance Overall balance assessment: Needs assistance ?Sitting-balance support: No upper extremity supported, Feet supported ?Sitting balance-Leahy Scale: Normal ?  ?  ?Standing balance support: No upper extremity supported, During functional activity ?Standing balance-Leahy Scale: Good ?Standing balance comment: limited by vision ?  ?  ?  ?  ?  ?  ?  ?  ?  ?  ?  ?   ? ?ADL either performed or assessed with clinical judgement  ? ?ADL Overall ADL's : Needs assistance/impaired ?  ?  ?  ?  ?  ?  ?  ?  ?  ?  ?  ?  ?  ?  ?  ?  ?  ?  ?  ?General ADL Comments: SUPERVISION + SETUP for toileitng using urinal in standing - assist to locate urinal 2/2 low vision. MIN A don B socks at bed  level, assist for threading over toes. MIN A don jacket at bed level  ? ? ? ? ?Pertinent Vitals/Pain Pain Assessment ?Pain Assessment: No/denies pain  ? ? ? ?Hand Dominance Right ?  ?Extremity/Trunk Assessment Upper Extremity Assessment ?Upper Extremity Assessment: Overall WFL for tasks assessed ?  ?Lower Extremity Assessment ?Lower Extremity Assessment: Overall WFL for tasks assessed ?  ?  ?  ?Communication Communication ?Communication: No  difficulties ?  ?Cognition Arousal/Alertness: Awake/alert ?Behavior During Therapy: [facility] for tasks assessed/performed ?Overall Cognitive Status: Within Functional Limits for tasks assessed ?  ?  ?  ?  ?  ?  ?  ?  ?  ?  ?  ?  ?  ?  ?  ?  ?  ?  ?  ?General Comments  pt denies SoB, pulse ox unable to read, left with PT to complete O2 assessment ? ?  ?   ?   ? ? ?Home Living Family/patient expects to be discharged to:: Private residence ?Living Arrangements: Parent ?  ?Type of Home: House ?Home Access: Stairs to enter ?Entrance Stairs-Number of Steps: 4 ?Entrance Stairs-Rails: Right ?Home Layout: One level ?  ?  ?  ?  ?  ?  ?  ?  ?  ?Additional Comments: lives with 12yo mother who has aids to assist her ?  ? ?  ?Prior Functioning/Environment Prior Level of Function : Independent/Modified Independent ?  ?  ?  ?  ?  ?  ?Mobility Comments: household ambulator ?ADLs Comments: limited by low vision ?  ? ?  ?  ?OT Problem List: Decreased strength;Decreased range of motion;Decreased activity tolerance;Decreased safety awareness;Impaired vision/perception ?  ?   ?OT Treatment/Interventions: Self-care/ADL training;Therapeutic exercise;Energy conservation;DME and/or AE instruction;Therapeutic activities;Patient/family education;Balance training  ?  ?OT Goals(Current goals can be found in the care plan section) Acute Rehab OT Goals ?Patient Stated Goal: to go home ?OT Goal Formulation: With patient ?Time For Goal Achievement: 09/30/21 ?Potential to Achieve Goals: Good ?ADL Goals ?Pt Will Perform Grooming: standing;Independently ?Pt Will Transfer to Toilet: ambulating;regular height toilet;with modified independence ?Additional ADL Goal #1: Pt will verbalize plan to implemetn x3 visual compenstaion strategies for falls prevention  ?OT Frequency: Min 2X/week ?  ? ?Co-evaluation   ?  ?  ?  ?  ? ?  ?AM-PAC OT "6 Clicks" Daily Activity     ?Outcome Measure Help from another person eating meals?: None ?Help from another person taking  care of personal grooming?: A Little ?Help from another person toileting, which includes using toliet, bedpan, or urinal?: A Little ?Help from another person bathing (including washing, rinsing, drying)?: A Little ?Help from another person to put on and taking off regular upper body clothing?: A Little ?Help from another person to put on and taking off regular lower body clothing?: A Little ?6 Click Score: 19 ?  ?End of Session   ? ?Activity Tolerance: Patient tolerated treatment well ?Patient left: in bed;with call bell/phone within reach;Other (comment) (PT in room) ? ?OT Visit Diagnosis: Other abnormalities of gait and mobility (R26.89);Muscle weakness (generalized) (M62.81)  ?              ?Time: 5465-6812 ?OT Time Calculation (min): 18 min ?Charges:  OT General Charges ?$OT Visit: 1 Visit ?OT Evaluation ?$OT Eval Low Complexity: 1 Low ?OT Treatments ?$Self Care/Home Management : 8-22 mins ? ?[name], M.S. OTR/L  ?09/16/21, 9:42 AM  ?ascom [phone] ? ?

## 2021-09-16 NOTE — Discharge Summary (Signed)
Physician Discharge Summary   Patient: Carl Bell MRN: [id number] DOB: [birth year]  Admit date:     09/14/2021  Discharge date: 09/16/21  Discharge Physician: Lorella Nimrod   PCP: System, Provider Not In   Recommendations at discharge:  Please make sure patient checks his pulse oximetry regularly and keeping the saturation above 90%. Follow-up with primary care provider in 1 week  Discharge Diagnoses: Principal Problem:   Aspiration pneumonia ([place]) Active Problems:   Tobacco abuse   Acute exacerbation of chronic obstructive pulmonary disease (COPD) [facility])   Hospital Course: Carl Bell is a 71 y.o. male with medical history significant for COPD, history of lung cancer post chemo and radiation therapy, nicotine dependence and hypertension who presents to [facility] ER via EMS for evaluation of worsening shortness of breath for the past 2 months and associated chest pain.  He was prescribed oxygen as needed but he has had to use it continuously over the last couple of months due to his worsening shortness of breath.  He was started on Unasyn.  Procalcitonin negative.  Pulmonology saw him and advised to continue Augmentin for few more days.  He was discharged on Augmentin and will follow-up with his pulmonologist for further recommendations.  There was also concern of COPD exacerbation for which he received Solu-Medrol followed by prednisone.  He was given 4 more days of prednisone to complete the course. He was also advised to check his pulse ox and keep the saturation above 90%, might be becoming more oxygen dependent now.  Patient will continue with rest of his home medications and follow-up with his providers.  Assessment and Plan: * Aspiration pneumonia [facility]) Patient presents to the ER for evaluation of chest pain and shortness of breath. He had a CT angiogram of the chest which showed retained secretions in the trachea at the thoracic inlet, and partially opacified posterior basal  segment right lower lobe airways with platelike lower lobe opacity. Consider aspiration, bronchopneumonia, or atelectasis. Chronic scarring in both middle lobes. Will start patient on IV Unasyn Speech therapy consult for swallow function evaluation Consult pulmonary      Acute exacerbation of chronic obstructive pulmonary disease (COPD) ([place]) Patient with a history of COPD who presents for evaluation of worsening shortness of breath and chest pain Noted to have diffuse wheezing upon arrival to the ER and received 1 dose of Solu-Medrol 125mg  IV We will place patient on Solu-Medrol 40 mg IV every 12 Continue bronchodilator therapy as well as inhaled steroids  Tobacco abuse Smoking cessation  was discussed with patient in detail We will place him on a nicotine transdermal patch   Consultants: None Procedures performed: None Disposition: Home health Diet recommendation:  Discharge Diet Orders (From admission, onward)     Start     Ordered   09/16/21 0000  Diet - low sodium heart healthy        09/16/21 1049           Cardiac diet DISCHARGE MEDICATION: Allergies as of 09/16/2021   No Known Allergies      Medication List     STOP taking these medications    metoprolol tartrate 25 MG tablet Commonly known as: LOPRESSOR       TAKE these medications    albuterol 108 (90 Base) MCG/ACT inhaler Commonly known as: VENTOLIN HFA Inhale 2-4 puffs by mouth every 4 hours as needed for wheezing, cough, and/or shortness of breath   dextromethorphan-guaiFENesin 30-600 MG 12hr tablet Commonly known as:  MUCINEX DM Take 1 tablet by mouth 2 (two) times daily.   dorzolamide-timolol 22.3-6.8 MG/ML ophthalmic solution Commonly known as: COSOPT Place 1 drop into both eyes 2 (two) times daily.   ipratropium-albuterol 0.5-2.5 (3) MG/3ML Soln Commonly known as: DUONEB Take 3 mLs by nebulization every 6 (six) hours as needed (Shortness of breath).   latanoprost 0.005 %  ophthalmic solution Commonly known as: XALATAN Place 1 drop into both eyes at bedtime.   nicotine 7 mg/24hr patch Commonly known as: NICODERM CQ - dosed in mg/24 hr Place 1 patch (7 mg total) onto the skin daily.   predniSONE 10 MG tablet Commonly known as: DELTASONE Take 5 tablets (50 mg total) by mouth daily. For 3 days, then take 4 tablets for next 3 days, 3 tablets for 3 days, 2 tablets for 3 days, 1 tablet for 3 days   Trelegy Ellipta 100-62.5-25 MCG/ACT Aepb Generic drug: Fluticasone-Umeclidin-Vilant Inhale 1 puff into the lungs daily.               Durable Medical Equipment  (From admission, onward)           Start     Ordered   09/16/21 1051  For home use only DME 3 n 1  Once        09/16/21 1050   09/16/21 1050  For home use only DME Pulse oximeter  Once        09/16/21 1049            Discharge Exam: Filed Weights   09/13/21 2059  Weight: 72.6 kg   General.     In no acute distress. Pulmonary.  Lungs clear bilaterally, normal respiratory effort. CV.  Regular rate and rhythm, no JVD, rub or murmur. Abdomen.  Soft, nontender, nondistended, BS positive. CNS.  Alert and oriented .  No focal neurologic deficit. Extremities.  No edema, no cyanosis, pulses intact and symmetrical. Psychiatry.  Judgment and insight appears normal.   Condition at discharge: stable  The results of significant diagnostics from this hospitalization (including imaging, microbiology, ancillary and laboratory) are listed below for reference.   Imaging Studies: DG Chest 2 View  Result Date: 09/13/2021 CLINICAL DATA:  Worsening shortness of breath for several months, increasing oxygen requirement, COPD EXAM: CHEST - 2 VIEW COMPARISON:  07/07/2021 FINDINGS: Frontal and lateral views of the chest demonstrates stable right chest wall port. Cardiac silhouette is unremarkable. Chronic left lung scarring. Background emphysema unchanged. No airspace disease, effusion, or pneumothorax.  IMPRESSION: 1. Stable emphysema and left perihilar scarring. No acute airspace disease. Electronically Signed   By: Randa Ngo M.D.   On: 09/13/2021 21:19   CT Angio Chest PE W and/or Wo Contrast  Result Date: 09/14/2021 CLINICAL DATA:  71 year old male with increasing shortness of breath. Constant oxygen requirement now. History of treated right middle lobe lung cancer. EXAM: CT ANGIOGRAPHY CHEST WITH CONTRAST TECHNIQUE: Multidetector CT imaging of the chest was performed using the standard protocol during bolus administration of intravenous contrast. Multiplanar CT image reconstructions and MIPs were obtained to evaluate the vascular anatomy. RADIATION DOSE REDUCTION: This exam was performed according to the departmental dose-optimization program which includes automated exposure control, adjustment of the mA and/or kV according to patient size and/or use of iterative reconstruction technique. CONTRAST:  123mL OMNIPAQUE IOHEXOL 350 MG/ML SOLN COMPARISON:  Chest radiographs 09/13/2021 and earlier. Chest CTA 07/23/2017. restaging chest CT 05/20/2019. FINDINGS: Cardiovascular: Excellent contrast bolus timing in the pulmonary arterial tree. No focal filling defect  identified in the pulmonary arteries to suggest acute pulmonary embolism. Calcified aortic atherosclerosis. Stable tortuosity of the thoracic aorta. No cardiomegaly. Small, probably physiologic pericardial effusion is stable since 2020. Calcified coronary artery atherosclerosis. Little contrast in the aorta. Bilateral proximal great vessel calcified atherosclerosis. Right chest Port-A-Cath in place. Mediastinum/Nodes: Negative. No mediastinal mass or lymphadenopathy. Lungs/Pleura: Chronic emphysema is moderate to severe. Retained secretions in the trachea at the thoracic inlet on series 6, image 5. But other central airways remain patent. There is new partial opacification of left lower lobe airways (series 6, image 80) with platelike new posterior  basal segment right lower lobe opacity on series 6, image 90. Patchy opacity continues to the posterior right costophrenic angle. Chronic architectural distortion in both the lingula and along the right major fissure are stable since 2020. No pleural effusion or other acute pulmonary opacity. Upper Abdomen: Small volume perihepatic ascites is chronic but increased since 2020. Chronic calcific pancreatitis partially visible. Partially calcified exophytic roughly 3 cm left renal midpole lesion appears grossly unchanged from 02/28/2016 PET-CT, and was negative for hypermetabolism at that time. Negative other visible upper abdominal viscera including adrenal glands. Musculoskeletal: No acute or suspicious osseous lesion Review of the MIP images confirms the above findings. IMPRESSION: 1. Negative for acute pulmonary embolus. 2. Moderate to severe Emphysema (ICD10-J43.9). Retained secretions in the trachea at the thoracic inlet, and partially opacified posterior basal segment right lower lobe airways with platelike lower lobe opacity. Consider aspiration, bronchopneumonia, or atelectasis. Chronic scarring in both middle lobes. No pleural effusion or other acute pulmonary finding. 3. Chronic but increased small volume perihepatic ascites since 2020. Otherwise stable visible upper abdomen. 4. Calcified coronary artery and Aortic Atherosclerosis (ICD10-I70.0). Electronically Signed   By: Genevie Ann M.D.   On: 09/14/2021 05:33    Microbiology: Results for orders placed or performed during the hospital encounter of 09/14/21  Blood culture (routine x 2)     Status: None (Preliminary result)   Collection Time: 09/14/21  6:29 AM   Specimen: BLOOD  Result Value Ref Range Status   Specimen Description BLOOD BLOOD RIGHT FOREARM  Final   Special Requests   Final    BOTTLES DRAWN AEROBIC AND ANAEROBIC Blood Culture adequate volume   Culture   Final    NO GROWTH 2 DAYS Performed at [facility], [address] [place], [place] [address]    Report Status PENDING  Incomplete  Blood culture (routine x 2)     Status: None (Preliminary result)   Collection Time: 09/14/21  6:29 AM   Specimen: BLOOD  Result Value Ref Range Status   Specimen Description BLOOD BLOOD LEFT FOREARM  Final   Special Requests   Final    BOTTLES DRAWN AEROBIC AND ANAEROBIC Blood Culture adequate volume   Culture   Final    NO GROWTH 2 DAYS Performed at [facility], [address], [place], [place] [address]    Report Status PENDING  Incomplete  Resp Panel by RT-PCR (Flu A&B, Covid) Nasopharyngeal Swab     Status: None   Collection Time: 09/14/21  7:43 AM   Specimen: Nasopharyngeal Swab; Nasopharyngeal(NP) swabs in vial transport medium  Result Value Ref Range Status   SARS Coronavirus 2 by RT PCR NEGATIVE NEGATIVE Final    Comment: (NOTE) SARS-CoV-2 target nucleic acids are NOT DETECTED.  The SARS-CoV-2 RNA is generally detectable in upper respiratory specimens during the acute phase of infection. The lowest concentration of SARS-CoV-2 viral copies  this assay can detect is 138 copies/mL. A negative result does not preclude SARS-Cov-2 infection and should not be used as the sole basis for treatment or other patient management decisions. A negative result may occur with  improper specimen collection/handling, submission of specimen other than nasopharyngeal swab, presence of viral mutation(s) within the areas targeted by this assay, and inadequate number of viral copies(<138 copies/mL). A negative result must be combined with clinical observations, patient history, and epidemiological information. The expected result is Negative.  Fact Sheet for Patients:  EntrepreneurPulse.com.au  Fact Sheet for Healthcare Providers:  IncredibleEmployment.be  This test is no t yet approved or cleared by the [place] FDA and  has been authorized for detection and/or diagnosis of  SARS-CoV-2 by FDA under an Emergency Use Authorization (EUA). This EUA will remain  in effect (meaning this test can be used) for the duration of the COVID-19 declaration under Section 564(b)(1) of the Act, 21 U.S.C.section 360bbb-3(b)(1), unless the authorization is terminated  or revoked sooner.       Influenza A by PCR NEGATIVE NEGATIVE Final   Influenza B by PCR NEGATIVE NEGATIVE Final    Comment: (NOTE) The Xpert Xpress SARS-CoV-2/FLU/RSV plus assay is intended as an aid in the diagnosis of influenza from Nasopharyngeal swab specimens and should not be used as a sole basis for treatment. Nasal washings and aspirates are unacceptable for Xpert Xpress SARS-CoV-2/FLU/RSV testing.  Fact Sheet for Patients: EntrepreneurPulse.com.au  Fact Sheet for Healthcare Providers: IncredibleEmployment.be  This test is not yet approved or cleared by the [place] FDA and has been authorized for detection and/or diagnosis of SARS-CoV-2 by FDA under an Emergency Use Authorization (EUA). This EUA will remain in effect (meaning this test can be used) for the duration of the COVID-19 declaration under Section 564(b)(1) of the Act, 21 U.S.C. section 360bbb-3(b)(1), unless the authorization is terminated or revoked.  Performed at [facility], [address] [place], [place] [address]     Labs: CBC: Recent Labs  Lab 09/13/21 2101 09/15/21 0612 09/16/21 0558  WBC 6.1 8.6 9.2  HGB 11.6* 10.7* 10.8*  HCT 37.2* 33.3* 32.8*  MCV 77.5* 74.5* 73.7*  PLT 163 161 604   Basic Metabolic Panel: Recent Labs  Lab 09/13/21 2101 09/15/21 0612 09/16/21 0558  NA 139 136 139  K 4.0 4.1 3.9  CL 96* 99 102  CO2 33* 29 31  GLUCOSE 153* 141* 107*  BUN 19 24* 27*  CREATININE 0.94 0.73 0.89  CALCIUM 9.3 8.3* 8.4*  MG  --   --  1.8  PHOS  --   --  1.8*   Liver Function Tests: Recent Labs  Lab 09/16/21 0558  AST 12*  ALT 8  ALKPHOS 36*   BILITOT 0.3  PROT 5.3*  ALBUMIN 2.6*   CBG: No results for input(s): GLUCAP in the last 168 hours.  Discharge time spent: greater than 30 minutes.  Signed: Lorella Nimrod, MD [facility] 09/16/2021

## 2021-09-16 NOTE — Progress Notes (Signed)
Received MD order to discharge patient to home with oxygen, reviewed discharge instructions, follow up apportionments, prescriptions and home meds with patient and patient verbalized understanding.    ?

## 2021-09-16 NOTE — Progress Notes (Signed)
Modified Barium Swallow Progress Note ? ?Patient Details  ?Name: Carl Bell ?MRN: [id number] ?Date of Birth: 1951-06-22 ? ?Today's Date: 09/16/2021 ? ?Modified Barium Swallow completed.  Full report located under Chart Review in the Imaging Section. ? ?Brief recommendations include the following: ? ?Clinical Impression ? Pt presents with adequate oropharyngeal abilities when consuming thin liquids via cup, puree, graham crackers in puree and whole barium tablet with puree. Pt demonstrates effective oral abilities, timely swallow initiation and achieves good glottal closure. When consuming the barium tablet whole with thin liquids, the tablet had difficulty transiting thru the pharyngeal but when administered a bolus of applesauce, the tablet cleared effectively. At this time, it appears that pt is safe when consuming a regular diet with thin liquids via cup, medicine whole with puree. ?  ?Swallow Evaluation Recommendations ? ?   ? ? SLP Diet Recommendations: Regular solids;Thin liquid ? ? Liquid Administration via: Cup ? ? Medication Administration: Whole meds with puree ? ? Supervision: Full assist for feeding;Staff to assist with self feeding ? ? Compensations: Minimize environmental distractions;Slow rate;Small sips/bites ? ? Postural Changes: Seated upright at 90 degrees;Remain semi-upright after after feeds/meals (Comment) ? ? Oral Care Recommendations: Oral care BID ? ?   ? ? ?Avarose Mervine B. Rutherford Nail, M.S., CCC-SLP, CBIS ?Speech-Language Pathologist ?Rehabilitation Services ?Office [phone] ? ?[name] ?09/16/2021,12:52 PM ?

## 2021-09-17 MED ORDER — AMOXICILLIN-POT CLAVULANATE 875-125 MG PO TABS: 1.0000 | ORAL_TABLET | Freq: Two times a day (BID) | ORAL | 0 refills | Status: AC

## 2021-09-17 MED ORDER — PREDNISONE 50 MG PO TABS
50.0000 mg | ORAL_TABLET | Freq: Once | ORAL | Status: AC
  Administered 2021-09-17: 50 mg via ORAL
  Filled 2021-09-17: qty 1

## 2021-09-17 NOTE — TOC Progression Note (Signed)
Transition of Care (TOC) - Progression Note  ? ? ?Patient Details  ?Name: Carl Bell ?MRN: [id number] ?Date of Birth: 04/19/1951 ? ?Transition of Care (TOC) CM/SW Contact  ?Pete Pelt, RN ?Phone Number: ?09/17/2021, 10:40 AM ? ?Clinical Narrative:   Patient will be discharged today to home.  His transportation will bring home oxygen.  Patient states he was never instructed on his home oxygen unit and requested that someone come out and assist him.  RNCM contacted Zach at [facility], who stated they would send a team member to his home following discharge to assist patient.  Patient is aware, and has phone number for Zach. ? ? ? ?Expected Discharge Plan: [place] ?Barriers to Discharge: Continued Medical Work up ? ?Expected Discharge Plan and Services ?Expected Discharge Plan: [place] ?  ?  ?Post Acute Care Choice: Durable Medical Equipment, Home Health ?Living arrangements for the past 2 months: [place] ?Expected Discharge Date: 09/16/21               ?  ?  ?  ?  ?  ?HH Arranged: RN, OT, PT ?[place] Agency: [facility] ([place]) ?Date HH Agency Contacted: 09/16/21 ?Time [place]: 1120 ?Representative spoke with at [place]: Floydene Flock ? ? ?Social Determinants of Health (SDOH) Interventions ?  ? ?Readmission Risk Interventions ?No flowsheet data found. ? ?

## 2021-09-17 NOTE — Care Management Important Message (Signed)
Important Message ? ?Patient Details  ?Name: Carl Bell ?MRN: [id number] ?Date of Birth: 07-13-1951 ? ? ?Medicare Important Message Given:  Yes ? ? ? ? ?Juliann Pulse A Aram Domzalski ?09/17/2021, 9:43 AM ?

## 2021-09-17 NOTE — Progress Notes (Signed)
Physical Therapy Treatment ?Patient Details ?Name: Carl Bell ?MRN: [id number] ?DOB: 04-11-1951 ?Today's Date: 09/17/2021 ? ? ?History of Present Illness Carl Bell is a 71 y.o. male with medical history significant for COPD with chronic respiratory failure, history of lung cancer post chemo and radiation therapy, nicotine dependence and hypertension who presents to the ER via EMS for evaluation of worsening shortness of breath from his baseline associated with chest pain. CT angiogram of the chest which showed moderate to severe Emphysema. ? ?  ?PT Comments  ? ? Pt received in supine position and agreeable to therapy.  Pt requested to not participate in bed mobility or ambulation due to becoming SOB when performing, and he reports that he will be d/c at some point today.  Pt did elect to participate in bed-level exercises as noted below.  Pt does still have some difficulty with commands at times and requires tactile and verbal feedback for proper performance of the exercise.  Pt with good strength when performing however.  Pt advised to continue this set of exercises as part of HEP.  Current discharge plans to HHPT remain appropriate at this time.  Pt will continue to benefit from skilled therapy in order to address deficits listed below. ?   ?Recommendations for follow up therapy are one component of a multi-disciplinary discharge planning process, led by the attending physician.  Recommendations may be updated based on patient status, additional functional criteria and insurance authorization. ? ?Follow Up Recommendations ? Home health PT ?  ?  ?Assistance Recommended at Discharge Set up Supervision/Assistance  ?Patient can return home with the following A little help with walking and/or transfers ?  ?Equipment Recommendations ? None recommended by PT  ?  ?Recommendations for Other Services   ? ? ?  ?Precautions / Restrictions Precautions ?Precautions: None ?Restrictions ?Weight Bearing Restrictions: No  ?   ? ?Mobility ? Bed Mobility ?  ?  ?  ?  ?  ?  ?  ?General bed mobility comments: pt deferred mobility due to not wanting to be SOB before d/c at some point today. ?  ? ?Transfers ?  ?  ?  ?  ?  ?  ?  ?  ?  ?  ?  ? ?Ambulation/Gait ?  ?  ?  ?  ?  ?  ?  ?  ? ? ?Stairs ?  ?  ?  ?  ?  ? ? ?Wheelchair Mobility ?  ? ?Modified Rankin (Stroke Patients Only) ?  ? ? ?  ?Balance   ?  ?  ?  ?  ?  ?  ?  ?  ?  ?  ?  ?  ?  ?  ?  ?  ?  ?  ?  ? ?  ?Cognition Arousal/Alertness: Awake/alert ?Behavior During Therapy: [facility] for tasks assessed/performed ?Overall Cognitive Status: Within Functional Limits for tasks assessed ?  ?  ?  ?  ?  ?  ?  ?  ?  ?  ?  ?  ?  ?  ?  ?  ?  ?  ?  ? ?  ?Exercises Total Joint Exercises ?Ankle Circles/Pumps: AROM, Strengthening, Both, 10 reps, Supine ?Quad Sets: AROM, Strengthening, Both, 10 reps, Supine ?Gluteal Sets: AROM, Strengthening, Both, 10 reps, Supine ?Heel Slides: AROM, Strengthening, Both, 10 reps, Supine ?Hip ABduction/ADduction: AROM, Strengthening, Both, 10 reps, Supine ?Straight Leg Raises: AROM, Strengthening, Both, 10 reps, Supine ? ?  ?General Comments   ?  ?  ? ?  Pertinent Vitals/Pain Pain Assessment ?Pain Assessment: No/denies pain  ? ? ?Home Living   ?  ?  ?  ?  ?  ?  ?  ?  ?  ?   ?  ?Prior Function    ?  ?  ?   ? ?PT Goals (current goals can now be found in the care plan section) Acute Rehab PT Goals ?Patient Stated Goal: to go home ?PT Goal Formulation: With patient ?Time For Goal Achievement: 09/30/21 ?Potential to Achieve Goals: Good ?Progress towards PT goals: Progressing toward goals ? ?  ?Frequency ? ? ? Min 2X/week ? ? ? ?  ?PT Plan Current plan remains appropriate  ? ? ?Co-evaluation   ?  ?  ?  ?  ? ?  ?AM-PAC PT "6 Clicks" Mobility   ?Outcome Measure ? Help needed turning from your back to your side while in a flat bed without using bedrails?: None ?Help needed moving from lying on your back to sitting on the side of a flat bed without using bedrails?: None ?Help needed moving to  and from a bed to a chair (including a wheelchair)?: None ?Help needed standing up from a chair using your arms (e.g., wheelchair or bedside chair)?: A Little ?Help needed to walk in hospital room?: A Little ?Help needed climbing 3-5 steps with a railing? : A Little ?6 Click Score: 21 ? ?  ?End of Session   ?Activity Tolerance: Patient tolerated treatment well ?Patient left: in bed;with call bell/phone within reach;with bed alarm set ?  ?PT Visit Diagnosis: Unsteadiness on feet (R26.81) ?  ? ? ?Time: 5427-0623 ?PT Time Calculation (min) (ACUTE ONLY): 17 min ? ?Charges:  $Therapeutic Exercise: 8-22 mins          ?          ? ?Gwenlyn Saran, PT, DPT ?09/17/21, 1:38 PM ? ? ? ?Christie Nottingham ?09/17/2021, 1:33 PM ? ?

## 2021-09-19 LAB — BLOOD CULTURE ID PANEL (REFLEXED) - BCID2

## 2021-09-19 LAB — CULTURE, BLOOD (ROUTINE X 2)
Culture: NO GROWTH
Special Requests: ADEQUATE

## 2021-09-21 LAB — CULTURE, BLOOD (ROUTINE X 2): Special Requests: ADEQUATE

## 2021-10-11 ENCOUNTER — Inpatient Hospital Stay: Payer: Medicare Other | Admitting: Oncology

## 2021-10-11 ENCOUNTER — Telehealth: Payer: Self-pay | Admitting: *Deleted

## 2021-10-11 NOTE — Telephone Encounter (Signed)
Per Dr. Tonette Bihari office, pt needs to re-establish care with oncology to follow up history of lung cancer only. Pt does not have any new findings at this time. Dr. Grayland Ormond made aware and scheduling will re-attempt to contact pt to reschedule follow up visit.  ?

## 2021-10-11 NOTE — Telephone Encounter (Signed)
Pt scheduled to follow up with Dr. Grayland Ormond today for lung cancer based on referral from Dr. Ouida Sills. Pt did not show up for his appt today. Chart reviewed by MD and did not find any concerning findings that would warrant further follow up unless Dr. Ouida Sills has a specific reason other than lung cancer for pt to be seen. Message left with Dr. Ouida Sills to call back if pt needs to be rescheduled.  ?

## 2021-10-24 ENCOUNTER — Other Ambulatory Visit: Payer: Self-pay | Admitting: Internal Medicine

## 2021-10-24 DIAGNOSIS — I700 Atherosclerosis of aorta: Secondary | ICD-10-CM

## 2021-10-24 DIAGNOSIS — C3490 Malignant neoplasm of unspecified part of unspecified bronchus or lung: Secondary | ICD-10-CM

## 2021-10-28 NOTE — Progress Notes (Deleted)
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  INTERVAL HISTORY: Patient was last seen in clinic in December 2021.    Patient returns to clinic today for routine yearly evaluation.  He did not have his CT scan and this will be rescheduled in the next couple weeks.  He is in a wheelchair today, but states this is secondary to poor eyesight.  He otherwise feels well.  He has no neurologic complaints.  He denies any recent fevers or illnesses.  He has a good appetite and denies weight loss.  He has no chest pain, shortness of breath, cough, or hemoptysis. He denies any nausea, vomiting, constipation, or diarrhea. He has no urinary complaints.  Patient offers no further specific complaints today.  REVIEW OF SYSTEMS:   Review of Systems  Constitutional: Negative.  Negative for fever, malaise/fatigue and weight loss.  Respiratory: Negative.  Negative for cough, hemoptysis and shortness of breath.   Cardiovascular: Negative.  Negative for chest pain and leg swelling.  Gastrointestinal: Negative.  Negative for abdominal pain, blood in stool and melena.  Genitourinary: Negative.  Negative for dysuria.  Musculoskeletal: Negative.  Negative for back pain.  Skin: Negative.  Negative for rash.  Neurological: Negative.  Negative for sensory change, focal weakness and weakness.  Endo/Heme/Allergies:  Does not bruise/bleed easily.  Psychiatric/Behavioral: Negative.  The patient is not nervous/anxious.    As per HPI. Otherwise, a complete review of systems is negative.  PAST MEDICAL HISTORY: Past Medical History:  Diagnosis Date   COPD (chronic obstructive pulmonary disease) ([place])    Emphysema of lung  ([place])    Glaucoma    Hypertension    Lung cancer ([place])     PAST SURGICAL HISTORY: Past Surgical History:  Procedure Laterality Date   LOWER EXTREMITY ANGIOGRAPHY Left 07/11/2019   Procedure: LOWER EXTREMITY ANGIOGRAPHY;  Surgeon: Algernon Huxley, MD;  Location: [facility];  Service: Cardiovascular;  Laterality: Left;   LUNG BIOPSY     PERIPHERAL VASCULAR CATHETERIZATION N/A 09/24/2015   Procedure: [name];  Surgeon: Algernon Huxley, MD;  Location: [facility];  Service: Cardiovascular;  Laterality: N/A;   Port a cath placement      FAMILY HISTORY: Reviewed and unchanged. No reported history of malignancy or chronic disease.     ADVANCED DIRECTIVES:    HEALTH MAINTENANCE: Social History   Tobacco Use   Smoking status: Every Day    Packs/day: 0.50    Years: 50.00    Pack years: 25.00    Types: Cigarettes   Smokeless tobacco: Never  Vaping Use   Vaping Use: Never used  Substance Use Topics   Alcohol use: Yes   Drug use: No     No Known Allergies  Current Outpatient Medications  Medication Sig Dispense Refill   albuterol (VENTOLIN HFA) 108 (90 Base) MCG/ACT inhaler Inhale 2-4 puffs by mouth every 4 hours as needed for wheezing, cough, and/or shortness of breath 6.7 g 0   dextromethorphan-guaiFENesin (MUCINEX DM) 30-600 MG 12hr tablet Take 1 tablet by mouth 2 (two) times daily. 30 tablet 0   dorzolamide-timolol (COSOPT) 22.3-6.8 MG/ML ophthalmic solution Place 1 drop into both eyes 2 (two) times daily.  ipratropium-albuterol (DUONEB) 0.5-2.5 (3) MG/3ML SOLN Take 3 mLs by nebulization every 6 (six) hours as needed (Shortness of breath). 360 mL 0   latanoprost (XALATAN) 0.005 % ophthalmic solution Place 1 drop into both eyes at bedtime.     nicotine (NICODERM CQ - DOSED IN MG/24 HR) 7 mg/24hr patch Place 1 patch (7 mg total) onto the skin daily. 28 patch 0   predniSONE (DELTASONE) 10 MG tablet Take 5 tablets (50 mg total) by mouth daily. For 3  days, then take 4 tablets for next 3 days, 3 tablets for 3 days, 2 tablets for 3 days, 1 tablet for 3 days 50 tablet 0   TRELEGY ELLIPTA 100-62.5-25 MCG/ACT AEPB Inhale 1 puff into the lungs daily.     No current facility-administered medications for this visit.    OBJECTIVE: There were no vitals filed for this visit.    There is no height or weight on file to calculate BMI.    ECOG FS:0 - Asymptomatic  General: Well-developed, well-nourished, no acute distress. Eyes: Pink conjunctiva, anicteric sclera. HEENT: Normocephalic, moist mucous membranes. Lungs: No audible wheezing or coughing. Heart: Regular rate and rhythm. Abdomen: Soft, nontender, no obvious distention. Musculoskeletal: No edema, cyanosis, or clubbing. Neuro: Alert, answering all questions appropriately. Cranial nerves grossly intact. Skin: No rashes or petechiae noted. Psych: Normal affect.   LAB RESULTS:  Lab Results  Component Value Date   NA 139 09/16/2021   K 3.9 09/16/2021   CL 102 09/16/2021   CO2 31 09/16/2021   GLUCOSE 107 (H) 09/16/2021   BUN 27 (H) 09/16/2021   CREATININE 0.89 09/16/2021   CALCIUM 8.4 (L) 09/16/2021   PROT 5.3 (L) 09/16/2021   ALBUMIN 2.6 (L) 09/16/2021   AST 12 (L) 09/16/2021   ALT 8 09/16/2021   ALKPHOS 36 (L) 09/16/2021   BILITOT 0.3 09/16/2021   GFRNONAA >60 09/16/2021   GFRAA >60 11/09/2019    Lab Results  Component Value Date   WBC 9.2 09/16/2021   NEUTROABS 4.4 07/07/2021   HGB 10.8 (L) 09/16/2021   HCT 32.8 (L) 09/16/2021   MCV 73.7 (L) 09/16/2021   PLT 177 09/16/2021     STUDIES: No results found.   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  PLAN:    1. 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 Patient only received one infusion of consolidation chemotherapy on December 27, 2015, but this was discontinued secondary to persistent pancytopenia.  Patient's most  recent CT scan on May 20, 2019 reviewed independently with no obvious evidence of recurrent or progressive disease.  Patient will require repeat CT scan in the next several weeks with follow-up 1 to 2 days later.  Will continue yearly imaging and evaluation for 1 more year at which point patient will be 5 years removed from his treatment and can be discharged from clinic.  2.  Cough: Patient does not complain of this today. 3.  Leukopenia: Resolved. 4.  Anemia: Chronic and unchanged.  Patient's hemoglobin is 9.8.  Patient expressed understanding and was in agreement with this plan. He also understands that He can call clinic at any time with any questions, concerns, or complaints.   Lloyd Huger, MD 10/28/21 1:46 PM

## 2021-10-29 ENCOUNTER — Inpatient Hospital Stay: Payer: Medicare Other

## 2021-10-29 ENCOUNTER — Inpatient Hospital Stay: Payer: Medicare Other | Admitting: Oncology

## 2021-10-29 ENCOUNTER — Telehealth: Payer: Self-pay | Admitting: Emergency Medicine

## 2021-10-29 DIAGNOSIS — C3410 Malignant neoplasm of upper lobe, unspecified bronchus or lung: Secondary | ICD-10-CM

## 2021-10-29 NOTE — Telephone Encounter (Signed)
Pt no show to appointment. Per registration, "Just wanted to make yall aware, our Carl Bell went to pick patient up and he didn't come to the door, and the phone number we have in the chart is staying busy. me and the Carl Bell driver tried to call." Dr. Grayland Ormond made aware ?

## 2021-11-04 ENCOUNTER — Telehealth: Payer: Self-pay | Admitting: *Deleted

## 2021-11-04 NOTE — Telephone Encounter (Signed)
Pam called asking if patient has an appointment 5/12 with Dr Grayland Ormond and said if not and he does not have any appointment, he needs one scheduled. Per chart, he was a No Show for his appointment in April ?

## 2021-11-28 ENCOUNTER — Ambulatory Visit: Payer: Medicare Other | Admitting: Oncology

## 2021-12-01 NOTE — Progress Notes (Unsigned)
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  INTERVAL HISTORY: Patient returns to clinic today for routine yearly evaluation.  He did not have his CT scan and this will be rescheduled in the next couple weeks.  He is in a wheelchair today, but states this is secondary to poor eyesight.  He otherwise feels well.  He has no neurologic complaints.  He denies any recent fevers or illnesses.  He has a good appetite and denies weight loss.  He has no chest pain, shortness of breath, cough, or hemoptysis. He denies any nausea, vomiting, constipation, or diarrhea. He has no urinary complaints.  Patient offers no further specific complaints today.  REVIEW OF SYSTEMS:   Review of Systems  Constitutional: Negative.  Negative for fever, malaise/fatigue and weight loss.  Respiratory: Negative.  Negative for cough, hemoptysis and shortness of breath.   Cardiovascular: Negative.  Negative for chest pain and leg swelling.  Gastrointestinal: Negative.  Negative for abdominal pain, blood in stool and melena.  Genitourinary: Negative.  Negative for dysuria.  Musculoskeletal: Negative.  Negative for back pain.  Skin: Negative.  Negative for rash.  Neurological: Negative.  Negative for sensory change, focal weakness and weakness.  Endo/Heme/Allergies:  Does not bruise/bleed easily.  Psychiatric/Behavioral: Negative.  The patient is not nervous/anxious.    As per HPI. Otherwise, a complete review of systems is negative.  PAST MEDICAL HISTORY: Past Medical History:  Diagnosis Date   COPD (chronic obstructive pulmonary disease) ([place])    Emphysema of lung ([place])    Glaucoma    Hypertension    Lung cancer  ([place])     PAST SURGICAL HISTORY: Past Surgical History:  Procedure Laterality Date   LOWER EXTREMITY ANGIOGRAPHY Left 07/11/2019   Procedure: LOWER EXTREMITY ANGIOGRAPHY;  Surgeon: Algernon Huxley, MD;  Location: [facility];  Service: Cardiovascular;  Laterality: Left;   LUNG BIOPSY     PERIPHERAL VASCULAR CATHETERIZATION N/A 09/24/2015   Procedure: [name];  Surgeon: Algernon Huxley, MD;  Location: [facility];  Service: Cardiovascular;  Laterality: N/A;   Port a cath placement      FAMILY HISTORY: Reviewed and unchanged. No reported history of malignancy or chronic disease.     ADVANCED DIRECTIVES:    HEALTH MAINTENANCE: Social History   Tobacco Use   Smoking status: Every Day    Packs/day: 0.50    Years: 50.00    Pack years: 25.00    Types: Cigarettes   Smokeless tobacco: Never  Vaping Use   Vaping Use: Never used  Substance Use Topics   Alcohol use: Yes   Drug use: No     No Known Allergies  Current Outpatient Medications  Medication Sig Dispense Refill   albuterol (VENTOLIN HFA) 108 (90 Base) MCG/ACT inhaler Inhale 2-4 puffs by mouth every 4 hours as needed for wheezing, cough, and/or shortness of breath 6.7 g 0   dextromethorphan-guaiFENesin (MUCINEX DM) 30-600 MG 12hr tablet Take 1 tablet by mouth 2 (two) times daily. 30 tablet 0   dorzolamide-timolol (COSOPT) 22.3-6.8 MG/ML ophthalmic solution Place 1 drop into both eyes 2 (two) times daily.     ipratropium-albuterol (DUONEB) 0.5-2.5 (3) MG/3ML SOLN Take 3 mLs  by nebulization every 6 (six) hours as needed (Shortness of breath). 360 mL 0   latanoprost (XALATAN) 0.005 % ophthalmic solution Place 1 drop into both eyes at bedtime.     nicotine (NICODERM CQ - DOSED IN MG/24 HR) 7 mg/24hr patch Place 1 patch (7 mg total) onto the skin daily. 28 patch 0   predniSONE (DELTASONE) 10 MG tablet Take 5 tablets (50 mg total) by mouth daily. For 3 days, then take 4 tablets for next 3 days, 3 tablets  for 3 days, 2 tablets for 3 days, 1 tablet for 3 days 50 tablet 0   TRELEGY ELLIPTA 100-62.5-25 MCG/ACT AEPB Inhale 1 puff into the lungs daily.     No current facility-administered medications for this visit.    OBJECTIVE: There were no vitals filed for this visit.    There is no height or weight on file to calculate BMI.    ECOG FS:0 - Asymptomatic  General: Well-developed, well-nourished, no acute distress. Eyes: Pink conjunctiva, anicteric sclera. HEENT: Normocephalic, moist mucous membranes. Lungs: No audible wheezing or coughing. Heart: Regular rate and rhythm. Abdomen: Soft, nontender, no obvious distention. Musculoskeletal: No edema, cyanosis, or clubbing. Neuro: Alert, answering all questions appropriately. Cranial nerves grossly intact. Skin: No rashes or petechiae noted. Psych: Normal affect.   LAB RESULTS:  Lab Results  Component Value Date   NA 139 09/16/2021   K 3.9 09/16/2021   CL 102 09/16/2021   CO2 31 09/16/2021   GLUCOSE 107 (H) 09/16/2021   BUN 27 (H) 09/16/2021   CREATININE 0.89 09/16/2021   CALCIUM 8.4 (L) 09/16/2021   PROT 5.3 (L) 09/16/2021   ALBUMIN 2.6 (L) 09/16/2021   AST 12 (L) 09/16/2021   ALT 8 09/16/2021   ALKPHOS 36 (L) 09/16/2021   BILITOT 0.3 09/16/2021   GFRNONAA >60 09/16/2021   GFRAA >60 11/09/2019    Lab Results  Component Value Date   WBC 9.2 09/16/2021   NEUTROABS 4.4 07/07/2021   HGB 10.8 (L) 09/16/2021   HCT 32.8 (L) 09/16/2021   MCV 73.7 (L) 09/16/2021   PLT 177 09/16/2021     STUDIES: No results found.   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  PLAN:    1. 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 Patient only received one infusion of consolidation chemotherapy on December 27, 2015, but this was discontinued secondary to persistent pancytopenia.  Patient's most recent CT scan on May 20, 2019 reviewed  independently with no obvious evidence of recurrent or progressive disease.  Patient will require repeat CT scan in the next several weeks with follow-up 1 to 2 days later.  Will continue yearly imaging and evaluation for 1 more year at which point patient will be 5 years removed from his treatment and can be discharged from clinic.  2.  Cough: Patient does not complain of this today. 3.  Leukopenia: Resolved. 4.  Anemia: Chronic and unchanged.  Patient's hemoglobin is 9.8.  Patient expressed understanding and was in agreement with this plan. He also understands that He can call clinic at any time with any questions, concerns, or complaints.   Lloyd Huger, MD 12/01/21 6:38 AM

## 2021-12-03 ENCOUNTER — Encounter: Payer: Self-pay | Admitting: Oncology

## 2021-12-03 ENCOUNTER — Inpatient Hospital Stay: Payer: Medicare Other | Attending: Oncology | Admitting: Oncology

## 2021-12-03 VITALS — BP 147/86 | HR 85 | Temp 95.0°F | Resp 18 | Ht 73.0 in | Wt 124.0 lb

## 2021-12-03 DIAGNOSIS — C3410 Malignant neoplasm of upper lobe, unspecified bronchus or lung: Secondary | ICD-10-CM | POA: Insufficient documentation

## 2021-12-03 DIAGNOSIS — D649 Anemia, unspecified: Secondary | ICD-10-CM | POA: Insufficient documentation

## 2021-12-03 DIAGNOSIS — Z9981 Dependence on supplemental oxygen: Secondary | ICD-10-CM | POA: Insufficient documentation

## 2021-12-03 DIAGNOSIS — F17210 Nicotine dependence, cigarettes, uncomplicated: Secondary | ICD-10-CM | POA: Diagnosis not present

## 2021-12-03 DIAGNOSIS — R0602 Shortness of breath: Secondary | ICD-10-CM | POA: Diagnosis not present

## 2021-12-03 DIAGNOSIS — R5383 Other fatigue: Secondary | ICD-10-CM | POA: Insufficient documentation

## 2021-12-03 DIAGNOSIS — J449 Chronic obstructive pulmonary disease, unspecified: Secondary | ICD-10-CM | POA: Insufficient documentation

## 2021-12-03 DIAGNOSIS — R531 Weakness: Secondary | ICD-10-CM | POA: Insufficient documentation

## 2021-12-20 ENCOUNTER — Other Ambulatory Visit: Payer: Self-pay | Admitting: Internal Medicine

## 2021-12-20 DIAGNOSIS — K7460 Unspecified cirrhosis of liver: Secondary | ICD-10-CM

## 2022-03-20 ENCOUNTER — Emergency Department: Payer: Medicare HMO

## 2022-03-20 ENCOUNTER — Emergency Department
Admission: EM | Admit: 2022-03-20 | Discharge: 2022-03-20 | Disposition: A | Discharge status: Home / Self Care | Payer: Medicare HMO | Attending: Emergency Medicine | Admitting: Emergency Medicine

## 2022-03-20 ENCOUNTER — Other Ambulatory Visit: Payer: Self-pay

## 2022-03-20 DIAGNOSIS — Z85118 Personal history of other malignant neoplasm of bronchus and lung: Secondary | ICD-10-CM | POA: Insufficient documentation

## 2022-03-20 DIAGNOSIS — I10 Essential (primary) hypertension: Secondary | ICD-10-CM | POA: Insufficient documentation

## 2022-03-20 DIAGNOSIS — J42 Unspecified chronic bronchitis: Secondary | ICD-10-CM | POA: Diagnosis not present

## 2022-03-20 DIAGNOSIS — R051 Acute cough: Secondary | ICD-10-CM | POA: Diagnosis present

## 2022-03-20 LAB — CBC
HCT: 36 % — ABNORMAL LOW (ref 39.0–52.0)
Hemoglobin: 10.8 g/dL — ABNORMAL LOW (ref 13.0–17.0)
MCH: 24.2 pg — ABNORMAL LOW (ref 26.0–34.0)
MCHC: 30 g/dL (ref 30.0–36.0)
MCV: 80.5 fL (ref 80.0–100.0)
Platelets: 178 10*3/uL (ref 150–400)
RBC: 4.47 MIL/uL (ref 4.22–5.81)
RDW: 15.8 % — ABNORMAL HIGH (ref 11.5–15.5)
WBC: 6 10*3/uL (ref 4.0–10.5)
nRBC: 0 % (ref 0.0–0.2)

## 2022-03-20 LAB — BASIC METABOLIC PANEL
Anion gap: 6 (ref 5–15)
BUN: 14 mg/dL (ref 8–23)
CO2: 34 mmol/L — ABNORMAL HIGH (ref 22–32)
Calcium: 9.2 mg/dL (ref 8.9–10.3)
Chloride: 100 mmol/L (ref 98–111)
Creatinine, Ser: 0.71 mg/dL (ref 0.61–1.24)
GFR, Estimated: >60 mL/min (ref >60)
Glucose, Bld: 109 mg/dL — ABNORMAL HIGH (ref 70–99)
Potassium: 4.4 mmol/L (ref 3.5–5.1)
Sodium: 140 mmol/L (ref 135–145)

## 2022-03-20 LAB — TROPONIN I (HIGH SENSITIVITY): Troponin I (High Sensitivity): 5 ng/L (ref <18)

## 2022-03-20 MED ORDER — ALBUTEROL SULFATE HFA 108 (90 BASE) MCG/ACT IN AERS: 2.0000 | INHALATION_SPRAY | Freq: Four times a day (QID) | RESPIRATORY_TRACT | 0 refills | Status: DC | PRN

## 2022-03-20 MED ORDER — IPRATROPIUM-ALBUTEROL 0.5-2.5 (3) MG/3ML IN SOLN
3.0000 mL | Freq: Once | RESPIRATORY_TRACT | Status: AC
  Administered 2022-03-20: 3 mL via RESPIRATORY_TRACT
  Filled 2022-03-20: qty 3

## 2022-03-20 NOTE — ED Provider Notes (Signed)
[facility] Provider Note    Event Date/Time   First MD Initiated Contact with Patient 03/20/22 1640     (approximate)   History   Chief Complaint Shortness of Breath   HPI  Carl Bell is a 71 y.o. male with past medical history of COPD, chronic hypoxic respiratory failure on 2 L nasal cannula, hypertension, and lung cancer who presents to the ED complaining of shortness of breath.  Patient reports that last night he developed a cough productive of whitish sputum, has been feeling slightly short of breath since then.  He states "my cough was bad last night but I am doing okay today."  He denies any associated chest pain and has not had any pain or swelling in his legs.  He has been wearing his usual 2 L nasal cannula throughout the day, states he has not tried his inhaler at home.  He is not aware of any sick contacts and denies any fevers, nausea, vomiting, or diarrhea.     Physical Exam   Triage Vital Signs: ED Triage Vitals  Enc Vitals Group     BP 03/20/22 1431 (!) 159/98     Pulse Rate 03/20/22 1431 76     Resp 03/20/22 1431 (!) 26     Temp 03/20/22 1431 (!) 97.4 F (36.3 C)     Temp Source 03/20/22 1431 Oral     SpO2 03/20/22 1431 100 %     Weight 03/20/22 1427 125 lb (56.7 kg)     Height --      Head Circumference --      Peak Flow --      Pain Score 03/20/22 1427 0     Pain Loc --      Pain Edu? --      Excl. in [place]? --     Most recent vital signs: Vitals:   03/20/22 1431 03/20/22 1838  BP: (!) 159/98 (!) 155/87  Pulse: 76 88  Resp: (!) 26 17  Temp: (!) 97.4 F (36.3 C) 98.2 F (36.8 C)  SpO2: 100% 93%    Constitutional: Alert and oriented. Eyes: Conjunctivae are normal. Head: Atraumatic. Nose: No congestion/rhinnorhea. Mouth/Throat: Mucous membranes are moist.  Cardiovascular: Normal rate, regular rhythm. Grossly normal heart sounds.  2+ radial pulses bilaterally. Respiratory: Normal respiratory effort.  No retractions.  Lungs with faint end expiratory wheezing. Gastrointestinal: Soft and nontender. No distention. Musculoskeletal: No lower extremity tenderness nor edema.  Neurologic:  Normal speech and language. No gross focal neurologic deficits are appreciated.    ED Results / Procedures / Treatments   Labs (all labs ordered are listed, but only abnormal results are displayed) Labs Reviewed  BASIC METABOLIC PANEL - Abnormal; Notable for the following components:      Result Value   CO2 34 (*)    Glucose, Bld 109 (*)    All other components within normal limits  CBC - Abnormal; Notable for the following components:   Hemoglobin 10.8 (*)    HCT 36.0 (*)    MCH 24.2 (*)    RDW 15.8 (*)    All other components within normal limits  TROPONIN I (HIGH SENSITIVITY)     EKG  ED ECG REPORT I, Blake Divine, the attending physician, personally viewed and interpreted this ECG.   Date: 03/20/2022  EKG Time: 14:23  Rate: 74  Rhythm: normal sinus rhythm  Axis: Normal  Intervals:none  ST&T Change: None  RADIOLOGY Chest x-ray reviewed and  interpreted by me with no infiltrate, edema, or effusion.  PROCEDURES:  Critical Care performed: No  Procedures   MEDICATIONS ORDERED IN ED: Medications  ipratropium-albuterol (DUONEB) 0.5-2.5 (3) MG/3ML nebulizer solution 3 mL (3 mLs Nebulization Given 03/20/22 1758)     IMPRESSION / MDM / ASSESSMENT AND PLAN / ED COURSE  I reviewed the triage vital signs and the nursing notes.                              71 y.o. male with past medical history of hypertension, COPD, chronic hypoxic respiratory failure on 2 L nasal cannula, and lung cancer who presents to the ED complaining of cough with shortness of breath since last night.  Patient's presentation is most consistent with acute presentation with potential threat to life or bodily function.  Differential diagnosis includes, but is not limited to, COPD exacerbation, CHF, ACS, PE, pneumonia,  pneumothorax, bronchitis.  Patient nontoxic-appearing and in no acute distress, vital signs remarkable for mild tachypnea but otherwise reassuring.  Patient is not in any respiratory distress on my assessment, maintaining oxygen saturations on his usual 2 L nasal cannula.  He has some faint end expiratory wheezing and we will treat with DuoNeb.  Work-up thus far is reassuring with EKG showing no evidence of arrhythmia or ischemia, troponin within normal limits and I doubt ACS or PE.  Chest x-ray is also unremarkable with no evidence of pneumonia, labs without significant anemia, leukocytosis, electrolyte abnormality, or AKI.  Patient reports feeling much better following breathing treatment, improved air movement with no wheezing noted on reexamination.  He is appropriate for discharge home with PCP follow-up, will be prescribed new rescue inhaler for use as needed.  He was counseled to return to the ED for new or worsening symptoms, patient agrees with plan.      FINAL CLINICAL IMPRESSION(S) / ED DIAGNOSES   Final diagnoses:  Acute cough  Chronic bronchitis, unspecified chronic bronchitis type ([place])     Rx / DC Orders   ED Discharge Orders          Ordered    albuterol (VENTOLIN HFA) 108 (90 Base) MCG/ACT inhaler  Every 6 hours PRN       Note to Pharmacy: Please supply with spacer   03/20/22 1900             Note:  This document was prepared using Dragon voice recognition software and may include unintentional dictation errors.   Blake Divine, MD 03/20/22 [place]

## 2022-03-20 NOTE — ED Notes (Signed)
Pt verbalized understanding of discharge instructions.  Pt verbalized "I am feeling better already."  This RN assisted patient to families private vehicle for discharge.

## 2022-03-20 NOTE — ED Triage Notes (Signed)
Pt BIB EMS for SOB that started last night. Pt wear home O2 at 2L. Per pt, his SOB started to worsen last night and did not get better. Pt has hx of COPD.

## 2022-03-20 NOTE — ED Triage Notes (Signed)
First Nurse: Pt here via [facility] from home with sob that started yesterday. Pt also has a cough, denies fever or chills. Hx of COPD and lung CA.   100% on 4L (wears oxygen at home) 180/80 72 20

## 2022-04-13 ENCOUNTER — Emergency Department
Admission: EM | Admit: 2022-04-13 | Discharge: 2022-04-14 | Disposition: A | Discharge status: Home / Self Care | Payer: Medicare HMO | Attending: Emergency Medicine | Admitting: Emergency Medicine

## 2022-04-13 ENCOUNTER — Emergency Department: Payer: Medicare HMO

## 2022-04-13 ENCOUNTER — Other Ambulatory Visit: Payer: Self-pay

## 2022-04-13 ENCOUNTER — Encounter: Payer: Self-pay | Admitting: Emergency Medicine

## 2022-04-13 DIAGNOSIS — Z85118 Personal history of other malignant neoplasm of bronchus and lung: Secondary | ICD-10-CM | POA: Diagnosis not present

## 2022-04-13 DIAGNOSIS — R64 Cachexia: Secondary | ICD-10-CM | POA: Diagnosis not present

## 2022-04-13 DIAGNOSIS — I10 Essential (primary) hypertension: Secondary | ICD-10-CM | POA: Diagnosis not present

## 2022-04-13 DIAGNOSIS — K7460 Unspecified cirrhosis of liver: Secondary | ICD-10-CM | POA: Insufficient documentation

## 2022-04-13 DIAGNOSIS — J449 Chronic obstructive pulmonary disease, unspecified: Secondary | ICD-10-CM | POA: Diagnosis not present

## 2022-04-13 DIAGNOSIS — E162 Hypoglycemia, unspecified: Secondary | ICD-10-CM | POA: Diagnosis present

## 2022-04-13 DIAGNOSIS — Z20822 Contact with and (suspected) exposure to covid-19: Secondary | ICD-10-CM | POA: Diagnosis not present

## 2022-04-13 DIAGNOSIS — I517 Cardiomegaly: Secondary | ICD-10-CM | POA: Insufficient documentation

## 2022-04-13 LAB — CBC WITH DIFFERENTIAL/PLATELET
Abs Immature Granulocytes: 0.02 10*3/uL (ref 0.00–0.07)
Basophils Absolute: 0 10*3/uL (ref 0.0–0.1)
Basophils Relative: 1 %
Eosinophils Absolute: 0.1 10*3/uL (ref 0.0–0.5)
Eosinophils Relative: 1 %
HCT: 37.7 % — ABNORMAL LOW (ref 39.0–52.0)
Hemoglobin: 11.3 g/dL — ABNORMAL LOW (ref 13.0–17.0)
Immature Granulocytes: 0 %
Lymphocytes Relative: 10 %
Lymphs Abs: 0.6 10*3/uL — ABNORMAL LOW (ref 0.7–4.0)
MCH: 23.9 pg — ABNORMAL LOW (ref 26.0–34.0)
MCHC: 30 g/dL (ref 30.0–36.0)
MCV: 79.9 fL — ABNORMAL LOW (ref 80.0–100.0)
Monocytes Absolute: 0.5 10*3/uL (ref 0.1–1.0)
Monocytes Relative: 8 %
Neutro Abs: 4.5 10*3/uL (ref 1.7–7.7)
Neutrophils Relative %: 80 %
Platelets: 223 10*3/uL (ref 150–400)
RBC: 4.72 MIL/uL (ref 4.22–5.81)
RDW: 15.1 % (ref 11.5–15.5)
WBC: 5.7 10*3/uL (ref 4.0–10.5)
nRBC: 0 % (ref 0.0–0.2)

## 2022-04-13 LAB — RESP PANEL BY RT-PCR (FLU A&B, COVID) ARPGX2
Influenza A by PCR: NEGATIVE
Influenza B by PCR: NEGATIVE
SARS Coronavirus 2 by RT PCR: NEGATIVE

## 2022-04-13 LAB — COMPREHENSIVE METABOLIC PANEL
ALT: 10 U/L (ref 0–44)
AST: 27 U/L (ref 15–41)
Albumin: 3.9 g/dL (ref 3.5–5.0)
Alkaline Phosphatase: 50 U/L (ref 38–126)
Anion gap: 18 — ABNORMAL HIGH (ref 5–15)
BUN: 18 mg/dL (ref 8–23)
CO2: 25 mmol/L (ref 22–32)
Calcium: 9.6 mg/dL (ref 8.9–10.3)
Chloride: 96 mmol/L — ABNORMAL LOW (ref 98–111)
Creatinine, Ser: 0.81 mg/dL (ref 0.61–1.24)
GFR, Estimated: >60 mL/min (ref >60)
Glucose, Bld: 234 mg/dL — ABNORMAL HIGH (ref 70–99)
Potassium: 4.4 mmol/L (ref 3.5–5.1)
Sodium: 139 mmol/L (ref 135–145)
Total Bilirubin: 0.7 mg/dL (ref 0.3–1.2)
Total Protein: 7.3 g/dL (ref 6.5–8.1)

## 2022-04-13 LAB — CBG MONITORING, ED
Glucose-Capillary: 107 mg/dL — ABNORMAL HIGH (ref 70–99)
Glucose-Capillary: 98 mg/dL (ref 70–99)

## 2022-04-13 LAB — PROTIME-INR
INR: 1.2 (ref 0.8–1.2)
Prothrombin Time: 14.7 seconds (ref 11.4–15.2)

## 2022-04-13 LAB — URINALYSIS, ROUTINE W REFLEX MICROSCOPIC
Bacteria, UA: NONE SEEN
Bilirubin Urine: NEGATIVE
Glucose, UA: NEGATIVE mg/dL
Hgb urine dipstick: NEGATIVE
Ketones, ur: 5 mg/dL — AB
Leukocytes,Ua: NEGATIVE
Nitrite: NEGATIVE
Protein, ur: 30 mg/dL — AB
Specific Gravity, Urine: 1.016 (ref 1.005–1.030)
pH: 5 (ref 5.0–8.0)

## 2022-04-13 LAB — AMMONIA: Ammonia: 30 umol/L (ref 9–35)

## 2022-04-13 LAB — LIPASE, BLOOD: Lipase: 78 U/L — ABNORMAL HIGH (ref 11–51)

## 2022-04-13 MED ORDER — SODIUM CHLORIDE 0.9 % IV BOLUS
1000.0000 mL | Freq: Once | INTRAVENOUS | Status: AC
  Administered 2022-04-13: 1000 mL via INTRAVENOUS

## 2022-04-13 NOTE — ED Notes (Signed)
Patient's brother called and informed that he is ready for d/c

## 2022-04-13 NOTE — ED Triage Notes (Addendum)
Pt ems from home for hypoglycemia. Per ems pt has had decreased input x 2 weeks. Pt found to have cbg 24 at scene. Ems gave 250 ml D10. Cbg here 107. Pt a/o. Pt with hx lung ca and copd - uses O2 2lnc at home.

## 2022-04-13 NOTE — ED Provider Notes (Signed)
[facility] Provider Note    Event Date/Time   First MD Initiated Contact with Patient 04/13/22 1831     (approximate)   History   Chief Complaint: Hypoglycemia   HPI  Carl Bell is a 71 y.o. male with a history of COPD, cirrhosis, hypertension, protein calorie malnutrition, lung cancer who comes the ED due to confusion at home.  EMS report his blood glucose was 24 on scene.  They gave a D10 bolus with improvement of his blood glucose to 100.  On arrival, patient states that he feels fine.  He does report that he has had poor oral intake for the past 2 weeks due to loss of appetite.  Denies pain or shortness of breath.  He is on 2 L nasal cannula chronically.     Physical Exam   Triage Vital Signs: ED Triage Vitals  Enc Vitals Group     BP 04/13/22 1852 126/85     Pulse --      Resp 04/13/22 1852 16     Temp --      Temp src --      SpO2 --      Weight 04/13/22 1846 119 lb 4.3 oz (54.1 kg)     Height 04/13/22 1846 6\' 1"  (1.854 m)     Head Circumference --      Peak Flow --      Pain Score 04/13/22 1846 0     Pain Loc --      Pain Edu? --      Excl. in [place]? --     Most recent vital signs: Vitals:   04/13/22 2230 04/13/22 2300  BP: (!) 147/99 (!) 141/88  Pulse:  86  Resp: 17 16  SpO2:  98%    General: Awake, no distress.  Cachectic CV:  Good peripheral perfusion.  Tachycardia heart rate 110, normal distal pulses Resp:  Normal effort.  Clear to auscultation bilaterally. Abd:  No distention.  Soft nontender Other:  No lower extremity edema or calf tenderness.  No inflammatory changes.  Moist oral mucosa.   ED Results / Procedures / Treatments   Labs (all labs ordered are listed, but only abnormal results are displayed) Labs Reviewed  COMPREHENSIVE METABOLIC PANEL - Abnormal; Notable for the following components:      Result Value   Chloride 96 (*)    Glucose, Bld 234 (*)    Anion gap 18 (*)    All other components within  normal limits  LIPASE, BLOOD - Abnormal; Notable for the following components:   Lipase 78 (*)    All other components within normal limits  CBC WITH DIFFERENTIAL/PLATELET - Abnormal; Notable for the following components:   Hemoglobin 11.3 (*)    HCT 37.7 (*)    MCV 79.9 (*)    MCH 23.9 (*)    Lymphs Abs 0.6 (*)    All other components within normal limits  URINALYSIS, ROUTINE W REFLEX MICROSCOPIC - Abnormal; Notable for the following components:   Color, Urine YELLOW (*)    APPearance HAZY (*)    Ketones, ur 5 (*)    Protein, ur 30 (*)    All other components within normal limits  CBG MONITORING, ED - Abnormal; Notable for the following components:   Glucose-Capillary 107 (*)    All other components within normal limits  RESP PANEL BY RT-PCR (FLU A&B, COVID) ARPGX2  AMMONIA  PROTIME-INR  CBG MONITORING, ED  EKG Interpreted by me Sinus tachycardia, rate of 110, normal axis, normal intervals.  Normal QRS ST segments and T waves.   RADIOLOGY Chest x-ray interpreted by me, negative for effusion edema or infiltrate.  Radiology report reviewed.   PROCEDURES:  Procedures   MEDICATIONS ORDERED IN ED: Medications  sodium chloride 0.9 % bolus 1,000 mL (0 mLs Intravenous Stopped 04/13/22 2155)     IMPRESSION / MDM / ASSESSMENT AND PLAN / ED COURSE  I reviewed the triage vital signs and the nursing notes.                              Differential diagnosis includes, but is not limited to, pneumonia, pleural effusion, UTI, AKI, electrolyte abnormality, hepatic encephalopathy, liver failure, pancreatitis, anemia, nutritional deficiency, viral illness  Patient's presentation is most consistent with acute presentation with potential threat to life or bodily function.  Patient brought to the ED due to altered mental status related to hypoglycemia.  Glucose is normalized with D10 bolus given by EMS.  On arrival he is back to baseline.  Patient given additional IV fluids with  normalization of his heart rate, blood pressure remains essentially normal and other vitals are at baseline.  Patient mains calm in the ED.  Symptoms and ED evaluation results discussed with the patient and his mother and brother at bedside.  They agree that he is back to baseline, and are reassured by the unremarkable work-up.  COVID-negative, ammonia and INR.  No signs of any infectious process.  On repeat CBG, glucose is 98.  I think he does not require admission and can be discharged for outpatient follow-up and continued hospice care.       FINAL CLINICAL IMPRESSION(S) / ED DIAGNOSES   Final diagnoses:  Hypoglycemia  Cachexia ([place])     Rx / DC Orders   ED Discharge Orders     None        Note:  This document was prepared using Dragon voice recognition software and may include unintentional dictation errors.   Carrie Mew, MD 04/13/22 2355

## 2022-04-25 MED ORDER — DEXAMETHASONE SODIUM PHOSPHATE 10 MG/ML IJ SOLN
INTRAMUSCULAR | Status: AC
  Filled 2022-04-25: qty 1

## 2022-04-25 MED ORDER — LIDOCAINE HCL (PF) 2 % IJ SOLN
INTRAMUSCULAR | Status: AC
  Filled 2022-04-25: qty 5

## 2022-04-25 MED ORDER — PROPOFOL 10 MG/ML IV BOLUS
INTRAVENOUS | Status: AC
  Filled 2022-04-25: qty 20

## 2022-04-25 MED ORDER — ONDANSETRON HCL 4 MG/2ML IJ SOLN
INTRAMUSCULAR | Status: AC
  Filled 2022-04-25: qty 2

## 2022-04-25 MED ORDER — FENTANYL CITRATE (PF) 250 MCG/5ML IJ SOLN
INTRAMUSCULAR | Status: AC
  Filled 2022-04-25: qty 5

## 2022-04-25 MED ORDER — SUCCINYLCHOLINE CHLORIDE 200 MG/10ML IV SOSY
PREFILLED_SYRINGE | INTRAVENOUS | Status: AC
  Filled 2022-04-25: qty 10

## 2022-04-25 MED ORDER — ROCURONIUM BROMIDE 10 MG/ML (PF) SYRINGE
PREFILLED_SYRINGE | INTRAVENOUS | Status: AC
  Filled 2022-04-25: qty 10

## 2022-06-21 ENCOUNTER — Encounter: Payer: Self-pay | Admitting: Emergency Medicine

## 2022-06-21 DIAGNOSIS — I10 Essential (primary) hypertension: Secondary | ICD-10-CM | POA: Diagnosis present

## 2022-06-21 DIAGNOSIS — R319 Hematuria, unspecified: Secondary | ICD-10-CM | POA: Diagnosis not present

## 2022-06-21 DIAGNOSIS — J9611 Chronic respiratory failure with hypoxia: Secondary | ICD-10-CM | POA: Diagnosis present

## 2022-06-21 DIAGNOSIS — R64 Cachexia: Secondary | ICD-10-CM | POA: Diagnosis present

## 2022-06-21 DIAGNOSIS — K562 Volvulus: Principal | ICD-10-CM | POA: Diagnosis present

## 2022-06-21 DIAGNOSIS — T4145XA Adverse effect of unspecified anesthetic, initial encounter: Secondary | ICD-10-CM | POA: Diagnosis not present

## 2022-06-21 DIAGNOSIS — I952 Hypotension due to drugs: Secondary | ICD-10-CM | POA: Diagnosis not present

## 2022-06-21 DIAGNOSIS — H401130 Primary open-angle glaucoma, bilateral, stage unspecified: Secondary | ICD-10-CM | POA: Diagnosis present

## 2022-06-21 DIAGNOSIS — K5939 Other megacolon: Secondary | ICD-10-CM | POA: Diagnosis present

## 2022-06-21 DIAGNOSIS — K7460 Unspecified cirrhosis of liver: Secondary | ICD-10-CM | POA: Diagnosis present

## 2022-06-21 DIAGNOSIS — R451 Restlessness and agitation: Secondary | ICD-10-CM | POA: Diagnosis not present

## 2022-06-21 DIAGNOSIS — I700 Atherosclerosis of aorta: Secondary | ICD-10-CM | POA: Diagnosis present

## 2022-06-21 DIAGNOSIS — K4030 Unilateral inguinal hernia, with obstruction, without gangrene, not specified as recurrent: Secondary | ICD-10-CM | POA: Diagnosis present

## 2022-06-21 DIAGNOSIS — N049 Nephrotic syndrome with unspecified morphologic changes: Secondary | ICD-10-CM | POA: Diagnosis present

## 2022-06-21 DIAGNOSIS — N400 Enlarged prostate without lower urinary tract symptoms: Secondary | ICD-10-CM | POA: Diagnosis present

## 2022-06-21 DIAGNOSIS — Z9221 Personal history of antineoplastic chemotherapy: Secondary | ICD-10-CM

## 2022-06-21 DIAGNOSIS — Z681 Body mass index (BMI) 19 or less, adult: Secondary | ICD-10-CM

## 2022-06-21 DIAGNOSIS — Z923 Personal history of irradiation: Secondary | ICD-10-CM

## 2022-06-21 DIAGNOSIS — E876 Hypokalemia: Secondary | ICD-10-CM | POA: Diagnosis present

## 2022-06-21 DIAGNOSIS — H5500 Unspecified nystagmus: Secondary | ICD-10-CM | POA: Diagnosis present

## 2022-06-21 DIAGNOSIS — N1330 Unspecified hydronephrosis: Secondary | ICD-10-CM | POA: Diagnosis present

## 2022-06-21 DIAGNOSIS — Z833 Family history of diabetes mellitus: Secondary | ICD-10-CM

## 2022-06-21 DIAGNOSIS — E162 Hypoglycemia, unspecified: Secondary | ICD-10-CM | POA: Diagnosis present

## 2022-06-21 DIAGNOSIS — R410 Disorientation, unspecified: Secondary | ICD-10-CM | POA: Diagnosis not present

## 2022-06-21 DIAGNOSIS — E875 Hyperkalemia: Secondary | ICD-10-CM | POA: Diagnosis not present

## 2022-06-21 DIAGNOSIS — Z79899 Other long term (current) drug therapy: Secondary | ICD-10-CM

## 2022-06-21 DIAGNOSIS — Z9981 Dependence on supplemental oxygen: Secondary | ICD-10-CM

## 2022-06-21 DIAGNOSIS — J439 Emphysema, unspecified: Secondary | ICD-10-CM | POA: Diagnosis present

## 2022-06-21 DIAGNOSIS — E860 Dehydration: Secondary | ICD-10-CM | POA: Diagnosis present

## 2022-06-21 DIAGNOSIS — H409 Unspecified glaucoma: Secondary | ICD-10-CM | POA: Diagnosis present

## 2022-06-21 DIAGNOSIS — D62 Acute posthemorrhagic anemia: Secondary | ICD-10-CM | POA: Diagnosis not present

## 2022-06-21 DIAGNOSIS — N179 Acute kidney failure, unspecified: Secondary | ICD-10-CM | POA: Diagnosis present

## 2022-06-21 DIAGNOSIS — Z933 Colostomy status: Secondary | ICD-10-CM

## 2022-06-21 DIAGNOSIS — J90 Pleural effusion, not elsewhere classified: Secondary | ICD-10-CM | POA: Diagnosis present

## 2022-06-21 DIAGNOSIS — K766 Portal hypertension: Secondary | ICD-10-CM | POA: Diagnosis present

## 2022-06-21 DIAGNOSIS — Y92239 Unspecified place in hospital as the place of occurrence of the external cause: Secondary | ICD-10-CM | POA: Diagnosis not present

## 2022-06-21 DIAGNOSIS — F17210 Nicotine dependence, cigarettes, uncomplicated: Secondary | ICD-10-CM | POA: Diagnosis present

## 2022-06-21 DIAGNOSIS — E43 Unspecified severe protein-calorie malnutrition: Secondary | ICD-10-CM | POA: Diagnosis present

## 2022-06-21 DIAGNOSIS — K56699 Other intestinal obstruction unspecified as to partial versus complete obstruction: Secondary | ICD-10-CM | POA: Diagnosis present

## 2022-06-21 DIAGNOSIS — R188 Other ascites: Secondary | ICD-10-CM | POA: Diagnosis present

## 2022-06-21 DIAGNOSIS — K56609 Unspecified intestinal obstruction, unspecified as to partial versus complete obstruction: Secondary | ICD-10-CM | POA: Diagnosis not present

## 2022-06-21 DIAGNOSIS — Z8249 Family history of ischemic heart disease and other diseases of the circulatory system: Secondary | ICD-10-CM

## 2022-06-21 DIAGNOSIS — D638 Anemia in other chronic diseases classified elsewhere: Secondary | ICD-10-CM | POA: Diagnosis present

## 2022-06-21 DIAGNOSIS — Z85118 Personal history of other malignant neoplasm of bronchus and lung: Secondary | ICD-10-CM

## 2022-06-21 DIAGNOSIS — E785 Hyperlipidemia, unspecified: Secondary | ICD-10-CM | POA: Diagnosis present

## 2022-06-21 DIAGNOSIS — H548 Legal blindness, as defined in USA: Secondary | ICD-10-CM | POA: Diagnosis present

## 2022-06-21 DIAGNOSIS — Z751 Person awaiting admission to adequate facility elsewhere: Secondary | ICD-10-CM

## 2022-06-21 DIAGNOSIS — K861 Other chronic pancreatitis: Secondary | ICD-10-CM | POA: Diagnosis present

## 2022-06-21 LAB — CBC
HCT: 34.5 % — ABNORMAL LOW (ref 39.0–52.0)
Hemoglobin: 11 g/dL — ABNORMAL LOW (ref 13.0–17.0)
MCH: 24.3 pg — ABNORMAL LOW (ref 26.0–34.0)
MCHC: 31.9 g/dL (ref 30.0–36.0)
MCV: 76.2 fL — ABNORMAL LOW (ref 80.0–100.0)
Platelets: 362 10*3/uL (ref 150–400)
RBC: 4.53 MIL/uL (ref 4.22–5.81)
RDW: 15.6 % — ABNORMAL HIGH (ref 11.5–15.5)
WBC: 5.8 10*3/uL (ref 4.0–10.5)
nRBC: 0 % (ref 0.0–0.2)

## 2022-06-21 LAB — COMPREHENSIVE METABOLIC PANEL
ALT: 7 U/L (ref 0–44)
AST: 17 U/L (ref 15–41)
Albumin: 3.4 g/dL — ABNORMAL LOW (ref 3.5–5.0)
Alkaline Phosphatase: 53 U/L (ref 38–126)
Anion gap: 15 (ref 5–15)
BUN: 31 mg/dL — ABNORMAL HIGH (ref 8–23)
CO2: 24 mmol/L (ref 22–32)
Calcium: 9 mg/dL (ref 8.9–10.3)
Chloride: 97 mmol/L — ABNORMAL LOW (ref 98–111)
Creatinine, Ser: 1.06 mg/dL (ref 0.61–1.24)
GFR, Estimated: >60 mL/min (ref >60)
Glucose, Bld: 101 mg/dL — ABNORMAL HIGH (ref 70–99)
Potassium: 3.3 mmol/L — ABNORMAL LOW (ref 3.5–5.1)
Sodium: 136 mmol/L (ref 135–145)
Total Bilirubin: 1.4 mg/dL — ABNORMAL HIGH (ref 0.3–1.2)
Total Protein: 6.7 g/dL (ref 6.5–8.1)

## 2022-06-21 LAB — LIPASE, BLOOD: Lipase: 39 U/L (ref 11–51)

## 2022-06-21 NOTE — ED Triage Notes (Addendum)
Pt presents via EMS from home with complaints of constipation with associated lower abdominal pain x 3 days. He states this occurs when he consumes a lot of dairy. Endorses one episode of emesis yesterday and has mild abdominal distension. Of note, the patient wears 2L [place] chronically for COPD. Denies CP or SOB.

## 2022-06-22 ENCOUNTER — Inpatient Hospital Stay
Admission: EM | Admit: 2022-06-22 | Discharge: 2022-07-18 | DRG: 329 | Disposition: A | Discharge status: Skilled Nursing Facility | Payer: Medicare Other | Attending: Surgery | Admitting: Surgery

## 2022-06-22 ENCOUNTER — Inpatient Hospital Stay: Payer: Medicare Other

## 2022-06-22 ENCOUNTER — Encounter: Payer: Self-pay | Admitting: Surgery

## 2022-06-22 ENCOUNTER — Inpatient Hospital Stay: Payer: Medicare Other | Admitting: Anesthesiology

## 2022-06-22 ENCOUNTER — Emergency Department: Payer: Medicare Other

## 2022-06-22 ENCOUNTER — Encounter
Admission: EM | Disposition: A | Discharge status: Skilled Nursing Facility | Payer: Self-pay | Source: Home / Self Care | Attending: Surgery

## 2022-06-22 DIAGNOSIS — N1330 Unspecified hydronephrosis: Secondary | ICD-10-CM | POA: Diagnosis present

## 2022-06-22 DIAGNOSIS — K766 Portal hypertension: Secondary | ICD-10-CM | POA: Diagnosis present

## 2022-06-22 DIAGNOSIS — I700 Atherosclerosis of aorta: Secondary | ICD-10-CM | POA: Diagnosis present

## 2022-06-22 DIAGNOSIS — D62 Acute posthemorrhagic anemia: Secondary | ICD-10-CM | POA: Diagnosis not present

## 2022-06-22 DIAGNOSIS — I10 Essential (primary) hypertension: Secondary | ICD-10-CM | POA: Diagnosis present

## 2022-06-22 DIAGNOSIS — J439 Emphysema, unspecified: Secondary | ICD-10-CM | POA: Diagnosis present

## 2022-06-22 DIAGNOSIS — Y92239 Unspecified place in hospital as the place of occurrence of the external cause: Secondary | ICD-10-CM | POA: Diagnosis not present

## 2022-06-22 DIAGNOSIS — R188 Other ascites: Secondary | ICD-10-CM | POA: Diagnosis present

## 2022-06-22 DIAGNOSIS — H5500 Unspecified nystagmus: Secondary | ICD-10-CM | POA: Diagnosis not present

## 2022-06-22 DIAGNOSIS — R456 Violent behavior: Secondary | ICD-10-CM | POA: Diagnosis not present

## 2022-06-22 DIAGNOSIS — E162 Hypoglycemia, unspecified: Secondary | ICD-10-CM | POA: Diagnosis present

## 2022-06-22 DIAGNOSIS — N049 Nephrotic syndrome with unspecified morphologic changes: Secondary | ICD-10-CM | POA: Diagnosis present

## 2022-06-22 DIAGNOSIS — K7460 Unspecified cirrhosis of liver: Secondary | ICD-10-CM | POA: Diagnosis present

## 2022-06-22 DIAGNOSIS — K4030 Unilateral inguinal hernia, with obstruction, without gangrene, not specified as recurrent: Secondary | ICD-10-CM | POA: Diagnosis present

## 2022-06-22 DIAGNOSIS — K562 Volvulus: Principal | ICD-10-CM

## 2022-06-22 DIAGNOSIS — J9611 Chronic respiratory failure with hypoxia: Secondary | ICD-10-CM | POA: Diagnosis present

## 2022-06-22 DIAGNOSIS — J449 Chronic obstructive pulmonary disease, unspecified: Secondary | ICD-10-CM | POA: Diagnosis not present

## 2022-06-22 DIAGNOSIS — K5939 Other megacolon: Secondary | ICD-10-CM | POA: Diagnosis present

## 2022-06-22 DIAGNOSIS — H401133 Primary open-angle glaucoma, bilateral, severe stage: Secondary | ICD-10-CM | POA: Diagnosis present

## 2022-06-22 DIAGNOSIS — Z681 Body mass index (BMI) 19 or less, adult: Secondary | ICD-10-CM | POA: Diagnosis not present

## 2022-06-22 DIAGNOSIS — N139 Obstructive and reflux uropathy, unspecified: Secondary | ICD-10-CM | POA: Diagnosis present

## 2022-06-22 DIAGNOSIS — E43 Unspecified severe protein-calorie malnutrition: Secondary | ICD-10-CM | POA: Diagnosis present

## 2022-06-22 DIAGNOSIS — E876 Hypokalemia: Secondary | ICD-10-CM | POA: Insufficient documentation

## 2022-06-22 DIAGNOSIS — E785 Hyperlipidemia, unspecified: Secondary | ICD-10-CM | POA: Diagnosis present

## 2022-06-22 DIAGNOSIS — R64 Cachexia: Secondary | ICD-10-CM | POA: Diagnosis present

## 2022-06-22 DIAGNOSIS — D638 Anemia in other chronic diseases classified elsewhere: Secondary | ICD-10-CM | POA: Diagnosis present

## 2022-06-22 DIAGNOSIS — N179 Acute kidney failure, unspecified: Secondary | ICD-10-CM | POA: Diagnosis present

## 2022-06-22 DIAGNOSIS — E875 Hyperkalemia: Secondary | ICD-10-CM | POA: Diagnosis not present

## 2022-06-22 DIAGNOSIS — E860 Dehydration: Secondary | ICD-10-CM | POA: Diagnosis not present

## 2022-06-22 DIAGNOSIS — J90 Pleural effusion, not elsewhere classified: Secondary | ICD-10-CM | POA: Diagnosis present

## 2022-06-22 DIAGNOSIS — K861 Other chronic pancreatitis: Secondary | ICD-10-CM | POA: Diagnosis present

## 2022-06-22 DIAGNOSIS — T83091A Other mechanical complication of indwelling urethral catheter, initial encounter: Secondary | ICD-10-CM | POA: Diagnosis not present

## 2022-06-22 DIAGNOSIS — R2681 Unsteadiness on feet: Secondary | ICD-10-CM | POA: Diagnosis not present

## 2022-06-22 DIAGNOSIS — K56609 Unspecified intestinal obstruction, unspecified as to partial versus complete obstruction: Secondary | ICD-10-CM

## 2022-06-22 DIAGNOSIS — R339 Retention of urine, unspecified: Secondary | ICD-10-CM | POA: Diagnosis not present

## 2022-06-22 DIAGNOSIS — F99 Mental disorder, not otherwise specified: Secondary | ICD-10-CM | POA: Diagnosis present

## 2022-06-22 DIAGNOSIS — M6281 Muscle weakness (generalized): Secondary | ICD-10-CM | POA: Diagnosis not present

## 2022-06-22 DIAGNOSIS — Z9981 Dependence on supplemental oxygen: Secondary | ICD-10-CM | POA: Diagnosis not present

## 2022-06-22 DIAGNOSIS — N401 Enlarged prostate with lower urinary tract symptoms: Secondary | ICD-10-CM | POA: Diagnosis present

## 2022-06-22 DIAGNOSIS — Y732 Prosthetic and other implants, materials and accessory gastroenterology and urology devices associated with adverse incidents: Secondary | ICD-10-CM | POA: Diagnosis not present

## 2022-06-22 HISTORY — PX: COLONOSCOPY: SHX5424

## 2022-06-22 LAB — URINALYSIS, ROUTINE W REFLEX MICROSCOPIC
Bilirubin Urine: NEGATIVE
Glucose, UA: NEGATIVE mg/dL
Hgb urine dipstick: NEGATIVE
Ketones, ur: 5 mg/dL — AB
Leukocytes,Ua: NEGATIVE
Nitrite: NEGATIVE
Protein, ur: NEGATIVE mg/dL
Specific Gravity, Urine: 1.039 — ABNORMAL HIGH (ref 1.005–1.030)
pH: 5 (ref 5.0–8.0)

## 2022-06-22 LAB — CBC
HCT: 30.9 % — ABNORMAL LOW (ref 39.0–52.0)
Hemoglobin: 9.8 g/dL — ABNORMAL LOW (ref 13.0–17.0)
MCH: 24.1 pg — ABNORMAL LOW (ref 26.0–34.0)
MCHC: 31.7 g/dL (ref 30.0–36.0)
MCV: 75.9 fL — ABNORMAL LOW (ref 80.0–100.0)
Platelets: 274 10*3/uL (ref 150–400)
RBC: 4.07 MIL/uL — ABNORMAL LOW (ref 4.22–5.81)
RDW: 16 % — ABNORMAL HIGH (ref 11.5–15.5)
WBC: 5.8 10*3/uL (ref 4.0–10.5)
nRBC: 0 % (ref 0.0–0.2)

## 2022-06-22 LAB — CREATININE, SERUM
Creatinine, Ser: 0.88 mg/dL (ref 0.61–1.24)
GFR, Estimated: >60 mL/min (ref >60)

## 2022-06-22 LAB — MAGNESIUM: Magnesium: 1.7 mg/dL (ref 1.7–2.4)

## 2022-06-22 LAB — PROTIME-INR
INR: 1.3 — ABNORMAL HIGH (ref 0.8–1.2)
Prothrombin Time: 16.2 seconds — ABNORMAL HIGH (ref 11.4–15.2)

## 2022-06-22 LAB — TYPE AND SCREEN
ABO/RH(D): O POS
Antibody Screen: NEGATIVE

## 2022-06-22 LAB — APTT: aPTT: 30 seconds (ref 24–36)

## 2022-06-22 SURGERY — COLONOSCOPY
Anesthesia: General

## 2022-06-22 MED ORDER — PHENYLEPHRINE 80 MCG/ML (10ML) SYRINGE FOR IV PUSH (FOR BLOOD PRESSURE SUPPORT)
PREFILLED_SYRINGE | INTRAVENOUS | Status: DC | PRN
  Administered 2022-06-22 (×5): 160 ug via INTRAVENOUS

## 2022-06-22 MED ORDER — SUCCINYLCHOLINE CHLORIDE 200 MG/10ML IV SOSY
PREFILLED_SYRINGE | INTRAVENOUS | Status: AC
  Filled 2022-06-22: qty 20

## 2022-06-22 MED ORDER — SODIUM CHLORIDE 0.9 % IV SOLN: INTRAVENOUS | Status: DC | PRN

## 2022-06-22 MED ORDER — SUCCINYLCHOLINE CHLORIDE 200 MG/10ML IV SOSY
PREFILLED_SYRINGE | INTRAVENOUS | Status: DC | PRN
  Administered 2022-06-22: 80 mg via INTRAVENOUS

## 2022-06-22 MED ORDER — LACTATED RINGERS IV BOLUS
1000.0000 mL | Freq: Once | INTRAVENOUS | Status: AC
  Administered 2022-06-22: 1000 mL via INTRAVENOUS

## 2022-06-22 MED ORDER — FENTANYL CITRATE PF 50 MCG/ML IJ SOSY
50.0000 ug | PREFILLED_SYRINGE | Freq: Once | INTRAMUSCULAR | Status: AC
  Administered 2022-06-22: 50 ug via INTRAVENOUS
  Filled 2022-06-22: qty 1

## 2022-06-22 MED ORDER — UMECLIDINIUM BROMIDE 62.5 MCG/ACT IN AEPB
1.0000 | INHALATION_SPRAY | Freq: Every day | RESPIRATORY_TRACT | Status: DC
  Administered 2022-06-23 – 2022-07-18 (×23): 1 via RESPIRATORY_TRACT
  Filled 2022-06-22 (×6): qty 7

## 2022-06-22 MED ORDER — HYDROCODONE-ACETAMINOPHEN 5-325 MG PO TABS
1.0000 | ORAL_TABLET | ORAL | Status: DC | PRN
  Administered 2022-07-02: 2 via ORAL
  Administered 2022-07-02 – 2022-07-03 (×3): 1 via ORAL
  Administered 2022-07-04: 2 via ORAL
  Administered 2022-07-04: 1 via ORAL
  Administered 2022-07-04 – 2022-07-09 (×8): 2 via ORAL
  Administered 2022-07-12: 1 via ORAL
  Administered 2022-07-12 – 2022-07-16 (×3): 2 via ORAL
  Filled 2022-06-22 (×3): qty 2
  Filled 2022-06-22 (×2): qty 1
  Filled 2022-06-22 (×2): qty 2
  Filled 2022-06-22: qty 1
  Filled 2022-06-22: qty 2
  Filled 2022-06-22: qty 1
  Filled 2022-06-22: qty 2
  Filled 2022-06-22: qty 1
  Filled 2022-06-22 (×6): qty 2

## 2022-06-22 MED ORDER — ONDANSETRON HCL 4 MG/2ML IJ SOLN
INTRAMUSCULAR | Status: DC | PRN
  Administered 2022-06-22: 4 mg via INTRAVENOUS

## 2022-06-22 MED ORDER — MORPHINE SULFATE (PF) 2 MG/ML IV SOLN: 2.0000 mg | INTRAVENOUS | Status: DC | PRN

## 2022-06-22 MED ORDER — LIDOCAINE HCL (PF) 2 % IJ SOLN
INTRAMUSCULAR | Status: AC
  Filled 2022-06-22: qty 5

## 2022-06-22 MED ORDER — UMECLIDINIUM BROMIDE 62.5 MCG/ACT IN AEPB
1.0000 | INHALATION_SPRAY | Freq: Every day | RESPIRATORY_TRACT | Status: DC
  Filled 2022-06-22: qty 7

## 2022-06-22 MED ORDER — FLUTICASONE FUROATE-VILANTEROL 100-25 MCG/ACT IN AEPB
1.0000 | INHALATION_SPRAY | Freq: Every day | RESPIRATORY_TRACT | Status: DC
  Filled 2022-06-22: qty 28

## 2022-06-22 MED ORDER — ONDANSETRON 4 MG PO TBDP: 4.0000 mg | ORAL_TABLET | Freq: Four times a day (QID) | ORAL | Status: DC | PRN

## 2022-06-22 MED ORDER — LATANOPROST 0.005 % OP SOLN
1.0000 [drp] | Freq: Every day | OPHTHALMIC | Status: DC
  Administered 2022-06-22 – 2022-07-17 (×26): 1 [drp] via OPHTHALMIC
  Filled 2022-06-22 (×2): qty 2.5

## 2022-06-22 MED ORDER — ALBUTEROL SULFATE (2.5 MG/3ML) 0.083% IN NEBU: 2.5000 mg | INHALATION_SOLUTION | Freq: Four times a day (QID) | RESPIRATORY_TRACT | Status: DC | PRN

## 2022-06-22 MED ORDER — POTASSIUM CHLORIDE IN NACL 40-0.9 MEQ/L-% IV SOLN
INTRAVENOUS | Status: DC
  Filled 2022-06-22 (×8): qty 1000

## 2022-06-22 MED ORDER — PROPOFOL 10 MG/ML IV BOLUS
INTRAVENOUS | Status: AC
  Filled 2022-06-22: qty 20

## 2022-06-22 MED ORDER — PROPOFOL 10 MG/ML IV BOLUS
INTRAVENOUS | Status: DC | PRN
  Administered 2022-06-22: 130 mg via INTRAVENOUS

## 2022-06-22 MED ORDER — ONDANSETRON HCL 4 MG/2ML IJ SOLN: 4.0000 mg | Freq: Four times a day (QID) | INTRAMUSCULAR | Status: DC | PRN

## 2022-06-22 MED ORDER — FENTANYL CITRATE (PF) 100 MCG/2ML IJ SOLN
INTRAMUSCULAR | Status: AC
  Filled 2022-06-22: qty 2

## 2022-06-22 MED ORDER — IPRATROPIUM-ALBUTEROL 0.5-2.5 (3) MG/3ML IN SOLN: 3.0000 mL | Freq: Four times a day (QID) | RESPIRATORY_TRACT | Status: DC | PRN

## 2022-06-22 MED ORDER — TRAMADOL HCL 50 MG PO TABS
50.0000 mg | ORAL_TABLET | Freq: Four times a day (QID) | ORAL | Status: DC | PRN
  Administered 2022-06-25 – 2022-07-13 (×11): 50 mg via ORAL
  Filled 2022-06-22 (×11): qty 1

## 2022-06-22 MED ORDER — ENOXAPARIN SODIUM 40 MG/0.4ML IJ SOSY
40.0000 mg | PREFILLED_SYRINGE | INTRAMUSCULAR | Status: DC
  Administered 2022-06-23: 40 mg via SUBCUTANEOUS
  Filled 2022-06-22: qty 0.4

## 2022-06-22 MED ORDER — DORZOLAMIDE HCL-TIMOLOL MAL 2-0.5 % OP SOLN
1.0000 [drp] | Freq: Two times a day (BID) | OPHTHALMIC | Status: DC
  Administered 2022-06-23 – 2022-07-18 (×51): 1 [drp] via OPHTHALMIC
  Filled 2022-06-22 (×3): qty 10

## 2022-06-22 MED ORDER — LIDOCAINE HCL (CARDIAC) PF 100 MG/5ML IV SOSY
PREFILLED_SYRINGE | INTRAVENOUS | Status: DC | PRN
  Administered 2022-06-22: 80 mg via INTRAVENOUS

## 2022-06-22 MED ORDER — MIRTAZAPINE 15 MG PO TABS
7.5000 mg | ORAL_TABLET | Freq: Every day | ORAL | Status: DC
  Administered 2022-06-22 – 2022-06-24 (×3): 7.5 mg via ORAL
  Filled 2022-06-22 (×3): qty 1

## 2022-06-22 MED ORDER — HYDRALAZINE HCL 20 MG/ML IJ SOLN: 10.0000 mg | Freq: Four times a day (QID) | INTRAMUSCULAR | Status: DC | PRN

## 2022-06-22 MED ORDER — IOHEXOL 300 MG/ML  SOLN
100.0000 mL | Freq: Once | INTRAMUSCULAR | Status: AC | PRN
  Administered 2022-06-22: 100 mL via INTRAVENOUS

## 2022-06-22 MED ORDER — FLUTICASONE FUROATE-VILANTEROL 100-25 MCG/ACT IN AEPB
1.0000 | INHALATION_SPRAY | Freq: Every day | RESPIRATORY_TRACT | Status: DC
  Administered 2022-06-23 – 2022-07-18 (×24): 1 via RESPIRATORY_TRACT
  Filled 2022-06-22 (×4): qty 28

## 2022-06-22 NOTE — ED Provider Notes (Addendum)
[facility] Provider Note    Event Date/Time   First MD Initiated Contact with Patient 06/22/22 [phone]     (approximate)   History   Constipation   HPI  Carl Bell is a 71 y.o. male who presents to the ED for evaluation of Constipation   I reviewed PCP visit from 6/20.  History of COPD, HTN liver cirrhosis.  Follows with hospice via [place].  Chronic 2 L supplemental O2.  Patient presents to the ED for evaluation of 2 to 3 days of abdominal pain, distention, nausea, constipation and emesis.  He reports 53-month unintentional 50 pound weight loss.  Physical Exam   Triage Vital Signs: ED Triage Vitals  Enc Vitals Group     BP 06/21/22 2309 120/86     Pulse Rate 06/21/22 2309 95     Resp 06/21/22 2309 17     Temp 06/21/22 2309 98.4 F (36.9 C)     Temp Source 06/21/22 2309 Oral     SpO2 06/21/22 2309 100 %     Weight 06/21/22 2242 124 lb 1.9 oz (56.3 kg)     Height 06/21/22 2242 6\' 1"  (1.854 m)     Head Circumference --      Peak Flow --      Pain Score 06/21/22 2242 0     Pain Loc --      Pain Edu? --      Excl. in [place]? --     Most recent vital signs: Vitals:   06/21/22 2309 06/22/22 0328  BP: 120/86 122/77  Pulse: 95 93  Resp: 17 16  Temp: 98.4 F (36.9 C) 98.3 F (36.8 C)  SpO2: 100% 100%    General: Awake, no distress.  CV:  Good peripheral perfusion.  Resp:  Normal effort.  Abd:  Distended and tender throughout. MSK:  No deformity noted.  Neuro:  No focal deficits appreciated. Other:     ED Results / Procedures / Treatments   Labs (all labs ordered are listed, but only abnormal results are displayed) Labs Reviewed  COMPREHENSIVE METABOLIC PANEL - Abnormal; Notable for the following components:      Result Value   Potassium 3.3 (*)    Chloride 97 (*)    Glucose, Bld 101 (*)    BUN 31 (*)    Albumin 3.4 (*)    Total Bilirubin 1.4 (*)    All other components within normal limits  CBC - Abnormal; Notable for  the following components:   Hemoglobin 11.0 (*)    HCT 34.5 (*)    MCV 76.2 (*)    MCH 24.3 (*)    RDW 15.6 (*)    All other components within normal limits  LIPASE, BLOOD  URINALYSIS, ROUTINE W REFLEX MICROSCOPIC    EKG   RADIOLOGY Ct abd/pelvis interpreted by me with SBO  Official radiology report(s): CT ABDOMEN PELVIS W CONTRAST  Result Date: 06/22/2022 CLINICAL DATA:  Lower abdominal pain and constipation, evaluate for bowel obstruction. EXAM: CT ABDOMEN AND PELVIS WITH CONTRAST TECHNIQUE: Multidetector CT imaging of the abdomen and pelvis was performed using the standard protocol following bolus administration of intravenous contrast. RADIATION DOSE REDUCTION: This exam was performed according to the departmental dose-optimization program which includes automated exposure control, adjustment of the mA and/or kV according to patient size and/or use of iterative reconstruction technique. CONTRAST:  144mL OMNIPAQUE IOHEXOL 300 MG/ML  SOLN COMPARISON:  None similar FINDINGS: Lower chest: Mild reticulation at the  right lower lobe more than left lower lobe linear appearance favoring atelectasis. Hepatobiliary: No focal liver abnormality.No visualized gallbladder. No biliary obstruction noted. Pancreas: Extensive calcification of the atrophic pancreas with intermittent ductal dilatation and a 14 mm tail cyst, likely pseudocyst in this setting. No acute inflammation noted. Spleen: Unremarkable. Adrenals/Urinary Tract: Negative adrenals. Generalized atrophy. 2.8 cm cyst the interpolar left kidney with dystrophic calcified appearance, size unchanged from a 2017 PET CT. Similar although less confluent calcification of a right lower pole cyst measuring 18 mm with internal layering hematocrit/protein level, also size stable. A posterior right renal cyst is collapse since prior PET CT. Unremarkable bladder. Stomach/Bowel: Dilated colon to a marked degree with abrupt transition point where there is marked  twisting of the sigmoid colon. Transverse colon measures up to 8.5 cm in diameter. Large right groin hernia containing small bowel, dilated cecum, and normal appendix. Associated ascitic fluid. Prelim sent in epic chat Vascular/Lymphatic: Extensive atheromatous calcification of the aorta and its branches. No mass or adenopathy. Reproductive:No pathologic findings. Other: Small volume ascites.  No pneumoperitoneum. Musculoskeletal: No acute abnormalities. L5 chronic pars defects with L5-S1 anterolisthesis. IMPRESSION: 1. High-grade distal colonic obstruction due to sigmoid volvulus. 2. Large right groin hernia containing cecum, ileum, and ascites. 3. Chronic calcific pancreatitis.  14 mm presumed pseudocyst. 4. Bilateral complicated renal cysts that are size stable from a PET CT in 2017. 5. Advanced atherosclerosis. Electronically Signed   By: Jorje Guild M.D.   On: 06/22/2022 06:30    PROCEDURES and INTERVENTIONS:  Procedures  Medications  iohexol (OMNIPAQUE) 300 MG/ML solution 100 mL (100 mLs Intravenous Contrast Given 06/22/22 0614)  lactated ringers bolus 1,000 mL (1,000 mLs Intravenous New Bag/Given 06/22/22 0658)  fentaNYL (SUBLIMAZE) injection 50 mcg (50 mcg Intravenous Given 06/22/22 0705)     IMPRESSION / MDM / ASSESSMENT AND PLAN / ED COURSE  I reviewed the triage vital signs and the nursing notes.  Differential diagnosis includes, but is not limited to, SBO, viral syndrome, appendicitis,   {Patient presents with symptoms of an acute illness or injury that is potentially life-threatening.  Patient presents to the ED with a few days of abdominal pain and emesis superimposed on chronic unintentional weight loss, with evidence of an acute sigmoid volvulus causing small bowel obstruction.  Reassuring vital signs.  He is tender, but not peritoneal.  Doubt rupture at this point.  WBCs are normal as is his lipase.  Metabolic panel with marginal hypokalemia, but essentially normal.  CT  confirms obstruction, likely caused by sigmoid volvulus.  Again, doubt rupture at this point.  We will initiate medical management, consult with medicine for admission and surgery for possible intervention.    I consult with Dr. Lysle Pearl, he agrees to see the patient in consultation   FINAL CLINICAL IMPRESSION(S) / ED DIAGNOSES   Final diagnoses:  Sigmoid volvulus ([place])  Small bowel obstruction ([place])     Rx / DC Orders   ED Discharge Orders     None        Note:  This document was prepared using Dragon voice recognition software and may include unintentional dictation errors.   Vladimir Crofts, MD 06/22/22 1761    Vladimir Crofts, MD 06/22/22 [phone]

## 2022-06-22 NOTE — Assessment & Plan Note (Addendum)
Secondary to GI losses from nausea and vomiting Supplement potassium Check magnesium levels

## 2022-06-22 NOTE — Consult Note (Signed)
Initial Consultation Note   Patient: Carl Bell MGQ:[id number] DOB: [birth year] PCP: Kirk Ruths, MD DOA: 06/22/2022 DOS: the patient was seen and examined on 06/22/2022 Primary service: [name], DO  Referring physician: Dr Lysle Pearl Reason for consult: Medical management of patient's multiple comorbidities  Assessment/Plan: Assessment and Plan: * Volvulus of intestine [facility]) Patient presents to the ER for evaluation of constipation associated with diffuse abdominal pain, nausea and vomiting and is found to have a high-grade distal colonic obstruction due to sigmoid volvulus.  Patient admitted to the surgical service and further treatment plan per surgery  Hypertension Will place patient on as needed hydralazine to optimize blood pressure control since he is n.p.o. and unable to receive oral meds  COPD (chronic obstructive pulmonary disease) ([place]) Patient has a history of COPD with chronic respiratory failure on 2 L of oxygen continuous. Stable and not acutely exacerbated Continue as needed bronchodilator therapy as well as inhaled steroids Continue oxygen supplementation to maintain pulse oximetry greater than 92%        TRH will continue to follow the patient.  HPI: Carl Bell is a 71 y.o. male with past medical history significant for COPD with chronic respiratory failure on 2 L of oxygen, hypertension, history of lung cancer status post chemoradiation therapy, history of liver cirrhosis and hypertension who presents to the ER for evaluation of a 3-day history of constipation associated with diffuse abdominal pain with localization to the periumbilical area rated an 8 x 10 in intensity at its worst.  There is no radiation of the pain but it is associated with nausea and vomiting.  He admits to a 57-month unintentional 50 pound weight loss.  He denies having any alleviating or aggravating factors. He denies having any chest pain, no dizziness, no lightheadedness, no  urinary symptoms, no leg swelling, no headache, no dizziness, no lightheadedness, no blurred vision no focal deficit. Patient had a CT scan of abdomen and pelvis which showed high-grade distal colonic obstruction due to sigmoid volvulus. Large right groin hernia containing cecum, ileum, and ascites. Chronic calcific pancreatitis.  14 mm presumed pseudocyst. Bilateral complicated renal cysts that are size stable from a PET CT in 2017. Advanced atherosclerosis. Patient has been admitted to the surgical service and medical consult requested to manage patient's multiple comorbidities.     Review of Systems: As mentioned in the history of present illness. All other systems reviewed and are negative. Past Medical History:  Diagnosis Date   COPD (chronic obstructive pulmonary disease) ([place])    Emphysema of lung ([place])    Glaucoma    Hypertension    Lung cancer [facility])    Past Surgical History:  Procedure Laterality Date   LOWER EXTREMITY ANGIOGRAPHY Left 07/11/2019   Procedure: LOWER EXTREMITY ANGIOGRAPHY;  Surgeon: Algernon Huxley, MD;  Location: [place] CV LAB;  Service: Cardiovascular;  Laterality: Left;   LUNG BIOPSY     PERIPHERAL VASCULAR CATHETERIZATION N/A 09/24/2015   Procedure: [name];  Surgeon: Algernon Huxley, MD;  Location: [place] CV LAB;  Service: Cardiovascular;  Laterality: N/A;   Port a cath placement     Social History:  reports that he has been smoking cigarettes. He has a 25.00 pack-year smoking history. He has never used smokeless tobacco. He reports current alcohol use. He reports that he does not use drugs.  No Known Allergies  Family History  Problem Relation Age of Onset   Diabetes Mellitus II Mother    Hypertension  Father     Prior to Admission medications   Medication Sig Start Date End Date Taking? Authorizing Provider  albuterol (VENTOLIN HFA) 108 (90 Base) MCG/ACT inhaler Inhale 2 puffs into the lungs every 6 (six) hours as needed for  wheezing or shortness of breath. 03/20/22   Blake Divine, MD  dextromethorphan-guaiFENesin [facility] DM) 30-600 MG 12hr tablet Take 1 tablet by mouth 2 (two) times daily. Patient not taking: Reported on 12/03/2021 07/08/21   Lorella Nimrod, MD  dorzolamide-timolol (COSOPT) 22.3-6.8 MG/ML ophthalmic solution Place 1 drop into both eyes 2 (two) times daily. 07/10/20   [provider]  furosemide (LASIX) 40 MG tablet Take by mouth. Patient not taking: Reported on 12/03/2021 09/25/21 09/25/22  [provider]  ipratropium-albuterol (DUONEB) 0.5-2.5 (3) MG/3ML SOLN Take 3 mLs by nebulization every 6 (six) hours as needed (Shortness of breath). 07/08/21   Lorella Nimrod, MD  latanoprost (XALATAN) 0.005 % ophthalmic solution Place 1 drop into both eyes at bedtime. 07/10/20   [provider]  mirtazapine (REMERON) 7.5 MG tablet Take by mouth. 10/23/21 10/23/22  [provider]  nicotine (NICODERM CQ - DOSED IN MG/24 HR) 7 mg/24hr patch Place 1 patch (7 mg total) onto the skin daily. Patient not taking: Reported on 12/03/2021 09/16/21   Lorella Nimrod, MD  potassium chloride (KLOR-CON) 10 MEQ tablet Take by mouth. 09/25/21 09/25/22  [provider]  predniSONE (DELTASONE) 10 MG tablet Take 5 tablets (50 mg total) by mouth daily. For 3 days, then take 4 tablets for next 3 days, 3 tablets for 3 days, 2 tablets for 3 days, 1 tablet for 3 days Patient not taking: Reported on 12/03/2021 09/16/21   Lorella Nimrod, MD  TRELEGY ELLIPTA 100-62.5-25 MCG/ACT AEPB Inhale 1 puff into the lungs daily. 05/10/21   [provider]    Physical Exam: Vitals:   06/21/22 2309 06/22/22 0328 06/22/22 0730 06/22/22 0830  BP: 120/86 122/77 (!) 146/84 (!) 153/94  Pulse: 95 93 75 86  Resp: 17 16 16 20   Temp: 98.4 F (36.9 C) 98.3 F (36.8 C)    TempSrc: Oral     SpO2: 100% 100% 100% 100%  Weight:      Height:       Physical Exam Vitals and nursing note reviewed.  Constitutional:       Comments: Chronically ill-appearing, thin and frail  HENT:     Head: Normocephalic and atraumatic.     Nose: Nose normal.     Mouth/Throat:     Mouth: Mucous membranes are dry.  Eyes:     Comments: Pale conjunctiva  Cardiovascular:     Rate and Rhythm: Normal rate and regular rhythm.  Pulmonary:     Effort: Pulmonary effort is normal.     Breath sounds: Normal breath sounds.  Abdominal:     General: There is distension.     Comments: Abdomen is firm, distended and diffusely tender.  Hyperactive bowel sounds  Musculoskeletal:        General: Normal range of motion.     Cervical back: Normal range of motion and neck supple.  Skin:    General: Skin is warm and dry.  Neurological:     Mental Status: He is oriented to person, place, and time.  Psychiatric:        Mood and Affect: Mood normal.        Behavior: Behavior normal.     Data Reviewed:  Relevant notes from primary care and specialist visits,  past discharge summaries as available in EHR, including Care Everywhere. Prior diagnostic testing as pertinent to current admission diagnoses Updated medications and problem lists for reconciliation ED course, including vitals, labs, imaging, treatment and response to treatment Triage notes, nursing and pharmacy notes and ED provider's notes Notable results as noted in HPI Labs reviewed.  Lipase 39, sodium 136, potassium 3.3, chloride 97, bicarb 24, glucose 101, BUN 31, creatinine 1.06, calcium 9.0, total protein 6.7, albumin 3.4, AST 17, ALT 7, alkaline phosphatase 53, total bilirubin 1.4, white count 5.8, hemoglobin 11.0, hematocrit 34.5, platelet count 362 Results are pending, will review when available.    Family Communication: Greater than 50% of time was spent discussing patient's condition and plan of care with him at the bedside.  All questions and concerns have been addressed.  He lists his brother Aaron Edelman as his healthcare power of attorney.  CODE STATUS was discussed and he  wishes to be full code. Primary team communication:  Thank you very much for involving [place] in the care of your patient.  Author: Collier Bullock, MD 06/22/2022 9:26 AM  For on call review [email or web address].

## 2022-06-22 NOTE — Op Note (Signed)
[facility] Gastroenterology Patient Name: Carl Bell Procedure Date: 06/22/2022 9:57 AM MRN: [id number] Account #: [id number] Date of Birth: [birth year] Admit Type: Outpatient Age: 71 Room: [facility] ENDO ROOM 1 Gender: Male Note Status: Finalized Instrument Name: Jasper Riling [id number] Procedure:             Colonoscopy Indications:           Volvulus, For therapy of volvulus Providers:             [name] MD, MD Medicines:             General Anesthesia Complications:         No immediate complications. Procedure:             Pre-Anesthesia Assessment:                        - After reviewing the risks and benefits, the patient                         was deemed in satisfactory condition to undergo the                         procedure.                        After obtaining informed consent, the colonoscope was                         passed under direct vision. Throughout the procedure,                         the patient's blood pressure, pulse, and oxygen                         saturations were monitored continuously. The                         Colonoscope was introduced through the anus and                         advanced to the the ascending colon to examine a                         stricture. This was the intended extent. The                         colonoscopy was performed without difficulty. The                         patient tolerated the procedure well. Findings:      The perianal and digital rectal examinations were normal.      A volvulus with viable appearing mucosa was found in the sigmoid colon.       Decompression of the volvulus was attempted and was successful, with       complete decompression achieved. Following the maneuver, a tube was       placed to maintain the decompression. Estimated blood loss: none.      An extrinsic mild stenosis was found in the proximal ascending colon and       was non-traversed. known large scrotoinguinal  hernia with stenosis       location consistent at location of inguinal opening. procedure aborted       at this point due to risk of perforation with forceful manipulation of       colon in setting of acute volvulus Impression:            - Volvulus. Successful complete decompression achieved.                        - Stricture in the proximal ascending colon.                        - No specimens collected. Recommendation:        - Return patient to hospital ward for ongoing care.                        - NPO. Procedure Code(s):     --- Professional ---                        [id number], 61, Colonoscopy, flexible; with decompression                         (for pathologic distention) (eg, volvulus, megacolon),                         including placement of decompression tube, when                         performed Diagnosis Code(s):     --- Professional ---                        T02.111, Other intestinal obstruction unspecified as                         to partial versus complete obstruction                        K56.2, Volvulus CPT copyright 2022 American Medical Association. All rights reserved. The codes documented in this report are preliminary and upon coder review may  be revised to meet current compliance requirements. Dr. Sheppard Penton, MD [name] MD, MD 06/22/2022 11:09:45 AM This report has been signed electronically. Number of Addenda: 0 Note Initiated On: 06/22/2022 9:57 AM Total Procedure Duration: 0 hours 13 minutes 32 seconds  Estimated Blood Loss:  Estimated blood loss: none.      [facility]

## 2022-06-22 NOTE — Assessment & Plan Note (Signed)
S/p sigmoid colectomy.  Having bowel movement in colostomy bag. -Being started on clear liquid diet by general surgery. -Management by general surgery

## 2022-06-22 NOTE — Anesthesia Preprocedure Evaluation (Signed)
Anesthesia Evaluation  Patient identified by MRN, date of birth, ID band Patient awake    Reviewed: Allergy & Precautions, NPO status , Patient's Chart, lab work & pertinent test results  History of Anesthesia Complications Negative for: history of anesthetic complications  Airway Mallampati: I  TM Distance: >3 FB Neck ROM: Full    Dental  (+) Poor Dentition, Chipped, Missing   Pulmonary neg sleep apnea, COPD,  oxygen dependent, Current Smoker and Patient abstained from smoking.   Pulmonary exam normal breath sounds clear to auscultation       Cardiovascular Exercise Tolerance: Good METShypertension, + Peripheral Vascular Disease  (-) CAD and (-) Past MI (-) dysrhythmias  Rhythm:Regular Rate:Normal - Systolic murmurs    Neuro/Psych negative neurological ROS  negative psych ROS   GI/Hepatic ,neg GERD  ,,(+)     (-) substance abuse   Colonic obstruction from volvulus.   Endo/Other  neg diabetes    Renal/GU negative Renal ROS     Musculoskeletal   Abdominal   Peds  Hematology   Anesthesia Other Findings Past Medical History: No date: COPD (chronic obstructive pulmonary disease) (HCC) No date: Emphysema of lung (HCC) No date: Glaucoma No date: Hypertension No date: Lung cancer ([place])  Reproductive/Obstetrics                             Anesthesia Physical Anesthesia Plan  ASA: 3  Anesthesia Plan: General   Post-op Pain Management:    Induction: Intravenous and Rapid sequence  PONV Risk Score and Plan: 1 and Ondansetron and Treatment may vary due to age or medical condition  Airway Management Planned: Oral ETT and Video Laryngoscope Planned  Additional Equipment: None  Intra-op Plan:   Post-operative Plan: Extubation in OR  Informed Consent: I have reviewed the patients History and Physical, chart, labs and discussed the procedure including the risks, benefits and  alternatives for the proposed anesthesia with the patient or authorized representative who has indicated his/her understanding and acceptance.     Dental advisory given  Plan Discussed with: CRNA and Surgeon  Anesthesia Plan Comments: (Discussed risks of anesthesia with patient, including PONV, sore throat, lip/dental/eye damage, aspiration. To mitigate, we will place preop NG tube. Rare risks discussed as well, such as cardiorespiratory and neurological sequelae, and allergic reactions. Discussed the role of CRNA in patient's perioperative care. Patient understands.)       Anesthesia Quick Evaluation

## 2022-06-22 NOTE — Transfer of Care (Signed)
Immediate Anesthesia Transfer of Care Note  Patient: Carl Bell  Procedure(s) Performed: COLONOSCOPY  Patient Location: Endoscopy Unit  Anesthesia Type:General  Level of Consciousness: awake and alert   Airway & Oxygen Therapy: Patient Spontanous Breathing and Patient connected to face mask oxygen  Post-op Assessment: Report given to RN, Post -op Vital signs reviewed and stable, and Patient moving all extremities  Post vital signs: Reviewed and stable  Last Vitals:  Vitals Value Taken Time  BP    Temp    Pulse    Resp    SpO2      Last Pain:  Vitals:   06/22/22 1028  TempSrc: Temporal  PainSc: 0-No pain         Complications: No notable events documented.

## 2022-06-22 NOTE — H&P (Addendum)
Subjective:   CC: Sigmoid  HPI:  Carl Bell is a 71 y.o. male who was consulted by [facility] for issue above.  Symptoms were first noted 2 days ago. Pain is sharp, abdominal and diffuse.  Associated with constipation and distention, exacerbated by nothing specific.   Past Medical History:  has a past medical history of COPD (chronic obstructive pulmonary disease) ([place]), Emphysema of lung ([place]), Glaucoma, Hypertension, and Lung cancer ([place]).  Past Surgical History:  Past Surgical History:  Procedure Laterality Date   LOWER EXTREMITY ANGIOGRAPHY Left 07/11/2019   Procedure: LOWER EXTREMITY ANGIOGRAPHY;  Surgeon: Algernon Huxley, MD;  Location: [facility];  Service: Cardiovascular;  Laterality: Left;   LUNG BIOPSY     PERIPHERAL VASCULAR CATHETERIZATION N/A 09/24/2015   Procedure: [name];  Surgeon: Algernon Huxley, MD;  Location: [facility];  Service: Cardiovascular;  Laterality: N/A;   Port a cath placement      Family History: family history includes Diabetes Mellitus II in his mother; Hypertension in his father.  Social History:  reports that he has been smoking cigarettes. He has a 25.00 pack-year smoking history. He has never used smokeless tobacco. He reports current alcohol use. He reports that he does not use drugs.  Current Medications:  Prior to Admission medications   Medication Sig Start Date End Date Taking? Authorizing Provider  albuterol (VENTOLIN HFA) 108 (90 Base) MCG/ACT inhaler Inhale 2 puffs into the lungs every 6 (six) hours as needed for wheezing or shortness of breath. 03/20/22   Blake Divine, MD  dextromethorphan-guaiFENesin [facility] DM) 30-600 MG 12hr tablet Take 1 tablet by mouth 2 (two) times daily. Patient not taking: Reported on 12/03/2021 07/08/21   Lorella Nimrod, MD  dorzolamide-timolol (COSOPT) 22.3-6.8 MG/ML ophthalmic solution Place 1 drop into both eyes 2 (two) times daily. 07/10/20   [provider]  furosemide (LASIX)  40 MG tablet Take by mouth. Patient not taking: Reported on 12/03/2021 09/25/21 09/25/22  [provider]  ipratropium-albuterol (DUONEB) 0.5-2.5 (3) MG/3ML SOLN Take 3 mLs by nebulization every 6 (six) hours as needed (Shortness of breath). 07/08/21   Lorella Nimrod, MD  latanoprost (XALATAN) 0.005 % ophthalmic solution Place 1 drop into both eyes at bedtime. 07/10/20   [provider]  mirtazapine (REMERON) 7.5 MG tablet Take by mouth. 10/23/21 10/23/22  [provider]  nicotine (NICODERM CQ - DOSED IN MG/24 HR) 7 mg/24hr patch Place 1 patch (7 mg total) onto the skin daily. Patient not taking: Reported on 12/03/2021 09/16/21   Lorella Nimrod, MD  potassium chloride (KLOR-CON) 10 MEQ tablet Take by mouth. 09/25/21 09/25/22  [provider]  predniSONE (DELTASONE) 10 MG tablet Take 5 tablets (50 mg total) by mouth daily. For 3 days, then take 4 tablets for next 3 days, 3 tablets for 3 days, 2 tablets for 3 days, 1 tablet for 3 days Patient not taking: Reported on 12/03/2021 09/16/21   Lorella Nimrod, MD  TRELEGY ELLIPTA 100-62.5-25 MCG/ACT AEPB Inhale 1 puff into the lungs daily. 05/10/21   [provider]    Allergies:  Allergies as of 06/21/2022   (No Known Allergies)    ROS:  General: Denies weight loss, weight gain, fatigue, fevers, chills, and night sweats. Eyes: Denies blurry vision, double vision, eye pain, itchy eyes, and tearing. Ears: Denies hearing loss, earache, and ringing in ears. Nose: Denies sinus pain, congestion, infections, runny nose, and nosebleeds. Mouth/throat: Denies hoarseness, sore throat, bleeding gums, and  difficulty swallowing. Heart: Denies chest pain, palpitations, racing heart, irregular heartbeat, leg pain or swelling, and decreased activity tolerance. Respiratory: Denies breathing difficulty, shortness of breath, wheezing, cough, and sputum. GI: Denies change in appetite, heartburn, nausea, vomiting, diarrhea, and blood in  stool. GU: Denies difficulty urinating, pain with urinating, urgency, frequency, blood in urine. Musculoskeletal: Denies joint stiffness, pain, swelling, muscle weakness. Skin: Denies rash, itching, mass, tumors, sores, and boils Neurologic: Denies headache, fainting, dizziness, seizures, numbness, and tingling. Psychiatric: Denies depression, anxiety, difficulty sleeping, and memory loss. Endocrine: Denies heat or cold intolerance, and increased thirst or urination. Blood/lymph: Denies easy bruising, easy bruising, and swollen glands     Objective:     BP (!) 153/94 (BP Location: Left Arm)   Pulse 86   Temp 98.3 F (36.8 C)   Resp 20   Ht 6\' 1"  (1.854 m)   Wt 56.3 kg   SpO2 100%   BMI 16.38 kg/m   Constitutional :  alert, cooperative, appears stated age, and no distress  Lymphatics/Throat:  no asymmetry, masses, or scars  Respiratory:  clear to auscultation bilaterally  Cardiovascular:  regular rate and rhythm  Gastrointestinal: Distended with voluntary guarding and tenderness to palpation all 4 quadrants .   Musculoskeletal: Steady movement  Skin: Cool and moist, no surgical scars   Psychiatric: Normal affect, non-agitated, not confused       LABS:     Latest Ref Rng & Units 06/21/2022   11:09 PM 04/13/2022    7:03 PM 03/20/2022    2:32 PM  CMP  Glucose 70 - 99 mg/dL 101  234  109   BUN 8 - 23 mg/dL 31  18  14    Creatinine 0.61 - 1.24 mg/dL 1.06  0.81  0.71   Sodium 135 - 145 mmol/L 136  139  140   Potassium 3.5 - 5.1 mmol/L 3.3  4.4  4.4   Chloride 98 - 111 mmol/L 97  96  100   CO2 22 - 32 mmol/L 24  25  34   Calcium 8.9 - 10.3 mg/dL 9.0  9.6  9.2   Total Protein 6.5 - 8.1 g/dL 6.7  7.3    Total Bilirubin 0.3 - 1.2 mg/dL 1.4  0.7    Alkaline Phos 38 - 126 U/L 53  50    AST 15 - 41 U/L 17  27    ALT 0 - 44 U/L 7  10        Latest Ref Rng & Units 06/21/2022   11:09 PM 04/13/2022    7:03 PM 03/20/2022    2:32 PM  CBC  WBC 4.0 - 10.5 K/uL 5.8  5.7  6.0    Hemoglobin 13.0 - 17.0 g/dL 11.0  11.3  10.8   Hematocrit 39.0 - 52.0 % 34.5  37.7  36.0   Platelets 150 - 400 K/uL 362  223  178     RADS: Narrative & Impression  CLINICAL DATA:  Lower abdominal pain and constipation, evaluate for bowel obstruction.   EXAM: CT ABDOMEN AND PELVIS WITH CONTRAST   TECHNIQUE: Multidetector CT imaging of the abdomen and pelvis was performed using the standard protocol following bolus administration of intravenous contrast.   RADIATION DOSE REDUCTION: This exam was performed according to the departmental dose-optimization program which includes automated exposure control, adjustment of the mA and/or kV according to patient size and/or use of iterative reconstruction technique.   CONTRAST:  166mL OMNIPAQUE IOHEXOL 300 MG/ML  SOLN  COMPARISON:  None similar   FINDINGS: Lower chest: Mild reticulation at the right lower lobe more than left lower lobe linear appearance favoring atelectasis.   Hepatobiliary: No focal liver abnormality.No visualized gallbladder. No biliary obstruction noted.   Pancreas: Extensive calcification of the atrophic pancreas with intermittent ductal dilatation and a 14 mm tail cyst, likely pseudocyst in this setting. No acute inflammation noted.   Spleen: Unremarkable.   Adrenals/Urinary Tract: Negative adrenals. Generalized atrophy. 2.8 cm cyst the interpolar left kidney with dystrophic calcified appearance, size unchanged from a 2017 PET CT. Similar although less confluent calcification of a right lower pole cyst measuring 18 mm with internal layering hematocrit/protein level, also size stable. A posterior right renal cyst is collapse since prior PET CT. Unremarkable bladder.   Stomach/Bowel: Dilated colon to a marked degree with abrupt transition point where there is marked twisting of the sigmoid colon. Transverse colon measures up to 8.5 cm in diameter. Large right groin hernia containing small bowel, dilated  cecum, and normal appendix. Associated ascitic fluid. Prelim sent in epic chat   Vascular/Lymphatic: Extensive atheromatous calcification of the aorta and its branches. No mass or adenopathy.   Reproductive:No pathologic findings.   Other: Small volume ascites.  No pneumoperitoneum.   Musculoskeletal: No acute abnormalities. L5 chronic pars defects with L5-S1 anterolisthesis.   IMPRESSION: 1. High-grade distal colonic obstruction due to sigmoid volvulus. 2. Large right groin hernia containing cecum, ileum, and ascites. 3. Chronic calcific pancreatitis.  14 mm presumed pseudocyst. 4. Bilateral complicated renal cysts that are size stable from a PET CT in 2017. 5. Advanced atherosclerosis.     Electronically Signed   By: Jorje Guild M.D.   On: 06/22/2022 06:30     Assessment:   Sigmoid volvulus  Plan:    Patient is listed as a hospice patient and when inquired about the status, patient stated he was not aware he was ever placed in hospice.  He specifically stated at this time he would like to be full code understanding the scope of intervention being on full CODE STATUS.  He verbalized Laurita Quint to be his medical power of attorney.  Initially recommended decompressive colonoscopy followed by innermost sigmoidectomy during same admission.  While attempting to discuss risk benefits alternatives to the procedure, patient repeatedly asked when the procedure will be performed multiple times over a span of a few minutes despite explaining to him it will be done within a couple hours.  He also kept asking whether or not there are any other alternatives to these procedures to resolve the sigmoid volvulus for which I repeatedly had to explain to him that there is no other alternative.  I asked if I can call any family members to discuss this issue further and he mention Festus Aloe as his medical power of attorney.  His voicemail was full so I called Mr. Timothy follows to his other  brother, who requested that I try to reach his Sister Ledora Bottcher first and that he is not comfortable making any medical decisions.  Ms. Tana Felts was also unavailable and her voicemail was full.  His mother Ruud Oran Rein was contacted and she also stated that she is in no state of mind to make any medical decisions for her son.  I then reached out to [facility] hospice and talk with the on-call nurse who mentioned that he is indeed a patient active with care program, and their documentation has not as a full code.  After the phone call  as above I returned to patient's room, and asked him again if he had any further questions or concerns.  He was able to better comprehend the situation and did not seem confused at the time.  Due to the time sensitive nature of a volvulus requiring decompression, I explained to him that we should proceed with making decision at this time in order to start preparing for the colonoscopy.  Patient verbalized understanding and at this point requested that we proceed with the decompressive colonoscopy.  At the time of the decision-making process, patient seemed alert and oriented, understanding enough to obtain consent.  Will continue to reach out to [place] in the meantime to update him as well.  Colonoscopy risk benefits alternatives discussed.  Risks include bleeding, perforation.  Alternatives includes none benefits include: Decompression and symptom resolution.  Typical postprocedure recovery time of additional days in hospital and likelihood of proceeding with formal sigmoidectomy as discussed above confirmed again.  The patient verbalized understanding and all questions were answered to the patient's satisfaction.  Hospitalist also requested for comanagement of comorbidities  labs/images/medications/previous chart entries reviewed personally and relevant changes/updates noted above.

## 2022-06-22 NOTE — ED Notes (Signed)
Dr. Lysle Pearl, DO at bedside at this time. Pt is scheduled to go to colon

## 2022-06-22 NOTE — ED Notes (Signed)
X-ray at bedside

## 2022-06-22 NOTE — ED Notes (Signed)
Pt back from ENDO at this time. Lab called to collect bloodwork at this time.

## 2022-06-22 NOTE — ED Notes (Signed)
Xray at bedside to confirm placement of NG tube

## 2022-06-22 NOTE — Progress Notes (Signed)
[facility] Liaison Note  Patient to ED for complaints of constipation associated with diffuse abdominal pain, nausea and vomiting found to have high grade distal colonic obstruction due to sigmoid volvulus.  Patient had a CT scan of abdomen and pelvis which showed high-grade distal colonic obstruction due to sigmoid volvulus. Large right groin hernia containing cecum, ileum, and ascites. Plan for colonoscopy and possible surgical intervention.  Met with patient and patients sister at bedside to discuss hospice services and the focus of hospice services being comfort. Patient did not realize he was receiving Hospice services, he thought Medicare staff were visiting. Patient and sister are clear they wish to pursue any and all aggressive measures that may be offered by the surgical team. Offered the opportunity to revoke the Hospice Medicare benefit to pursue aggressive treatment outside of the Hospice plan of care to which the patient and sister both agreed.  Revocation paperwork completed. Patient and sister aware that patient may resume hospice services at a later date if desired and approved.  Patient and sister request to be followed by [facility] outpatient palliative program. [facility] liaison will follow throughout hospitalization through discharge.  Please call with any hospice/outpatient palliative care questions.  Thank you, Margaretmary Eddy, BSN, RN [facility] Liaison [phone]

## 2022-06-22 NOTE — Anesthesia Procedure Notes (Addendum)
Procedure Name: Intubation Date/Time: 06/22/2022 10:40 AM  Performed by: Esaw Grandchild, CRNAPre-anesthesia Checklist: Patient identified, Emergency Drugs available, Suction available and Patient being monitored Patient Re-evaluated:Patient Re-evaluated prior to induction Oxygen Delivery Method: Circle system utilized Preoxygenation: Pre-oxygenation with 100% oxygen Induction Type: IV induction and Rapid sequence Laryngoscope Size: McGraph and 3 Grade View: Grade I Tube type: Oral Tube size: 7.5 mm Number of attempts: 1 Airway Equipment and Method: Stylet, Oral airway and Bite block Placement Confirmation: ETT inserted through vocal cords under direct vision, positive ETCO2 and breath sounds checked- equal and bilateral Secured at: 22 cm Tube secured with: Tape Dental Injury: Teeth and Oropharynx as per pre-operative assessment

## 2022-06-22 NOTE — Assessment & Plan Note (Signed)
Patient has a history of COPD with chronic respiratory failure on 2 L of oxygen continuous. Stable and not acutely exacerbated Continue as needed bronchodilator therapy as well as inhaled steroids Continue oxygen supplementation to maintain pulse oximetry greater than 92%

## 2022-06-22 NOTE — Assessment & Plan Note (Signed)
Will place patient on as needed hydralazine to optimize blood pressure control since he is n.p.o. and unable to receive oral meds

## 2022-06-22 NOTE — Anesthesia Postprocedure Evaluation (Signed)
Anesthesia Post Note  Patient: Carl Bell  Procedure(s) Performed: COLONOSCOPY  Patient location during evaluation: Endoscopy Anesthesia Type: General Level of consciousness: awake and alert Pain management: pain level controlled Vital Signs Assessment: post-procedure vital signs reviewed and stable Respiratory status: spontaneous breathing, nonlabored ventilation, respiratory function stable and patient connected to nasal cannula oxygen Cardiovascular status: blood pressure returned to baseline and stable Postop Assessment: no apparent nausea or vomiting Anesthetic complications: no   No notable events documented.   Last Vitals:  Vitals:   06/22/22 1124 06/22/22 1134  BP: 128/77 135/79  Pulse: 68 72  Resp: 20 18  Temp:    SpO2: 100% 100%    Last Pain:  Vitals:   06/22/22 1134  TempSrc:   PainSc: 0-No pain                 Arita Miss

## 2022-06-23 ENCOUNTER — Inpatient Hospital Stay: Payer: Medicare Other | Admitting: Certified Registered"

## 2022-06-23 ENCOUNTER — Encounter
Admission: EM | Disposition: A | Discharge status: Skilled Nursing Facility | Payer: Medicare HMO | Source: Home / Self Care | Attending: Surgery

## 2022-06-23 ENCOUNTER — Other Ambulatory Visit: Payer: Self-pay

## 2022-06-23 ENCOUNTER — Encounter: Payer: Self-pay | Admitting: Surgery

## 2022-06-23 DIAGNOSIS — K562 Volvulus: Secondary | ICD-10-CM | POA: Diagnosis not present

## 2022-06-23 HISTORY — PX: COLOSTOMY: SHX63

## 2022-06-23 HISTORY — PX: COLON RESECTION SIGMOID: SHX6737

## 2022-06-23 LAB — BASIC METABOLIC PANEL
Anion gap: 14 (ref 5–15)
BUN: 21 mg/dL (ref 8–23)
CO2: 21 mmol/L — ABNORMAL LOW (ref 22–32)
Calcium: 8.3 mg/dL — ABNORMAL LOW (ref 8.9–10.3)
Chloride: 104 mmol/L (ref 98–111)
Creatinine, Ser: 0.76 mg/dL (ref 0.61–1.24)
GFR, Estimated: >60 mL/min (ref >60)
Glucose, Bld: 74 mg/dL (ref 70–99)
Potassium: 4 mmol/L (ref 3.5–5.1)
Sodium: 139 mmol/L (ref 135–145)

## 2022-06-23 LAB — CBC
HCT: 36.3 % — ABNORMAL LOW (ref 39.0–52.0)
Hemoglobin: 11.6 g/dL — ABNORMAL LOW (ref 13.0–17.0)
MCH: 24.4 pg — ABNORMAL LOW (ref 26.0–34.0)
MCHC: 32 g/dL (ref 30.0–36.0)
MCV: 76.3 fL — ABNORMAL LOW (ref 80.0–100.0)
Platelets: 292 10*3/uL (ref 150–400)
RBC: 4.76 MIL/uL (ref 4.22–5.81)
RDW: 15.9 % — ABNORMAL HIGH (ref 11.5–15.5)
WBC: 6.1 10*3/uL (ref 4.0–10.5)
nRBC: 0 % (ref 0.0–0.2)

## 2022-06-23 LAB — PHOSPHORUS: Phosphorus: 2.6 mg/dL (ref 2.5–4.6)

## 2022-06-23 LAB — MAGNESIUM: Magnesium: 1.7 mg/dL (ref 1.7–2.4)

## 2022-06-23 SURGERY — COLECTOMY, SIGMOID, OPEN
Anesthesia: General

## 2022-06-23 MED ORDER — FENTANYL CITRATE (PF) 100 MCG/2ML IJ SOLN
INTRAMUSCULAR | Status: AC
  Filled 2022-06-23: qty 2

## 2022-06-23 MED ORDER — DEXAMETHASONE SODIUM PHOSPHATE 10 MG/ML IJ SOLN
INTRAMUSCULAR | Status: DC | PRN
  Administered 2022-06-23: 10 mg via INTRAVENOUS

## 2022-06-23 MED ORDER — PHENYLEPHRINE 80 MCG/ML (10ML) SYRINGE FOR IV PUSH (FOR BLOOD PRESSURE SUPPORT)
PREFILLED_SYRINGE | INTRAVENOUS | Status: DC | PRN
  Administered 2022-06-23 (×5): 160 ug via INTRAVENOUS
  Administered 2022-06-23: 80 ug via INTRAVENOUS
  Administered 2022-06-23 (×4): 160 ug via INTRAVENOUS

## 2022-06-23 MED ORDER — BUPIVACAINE LIPOSOME 1.3 % IJ SUSP
INTRAMUSCULAR | Status: DC | PRN
  Administered 2022-06-23: 20 mL

## 2022-06-23 MED ORDER — BUPIVACAINE HCL (PF) 0.5 % IJ SOLN
INTRAMUSCULAR | Status: DC | PRN
  Administered 2022-06-23: 30 mL

## 2022-06-23 MED ORDER — SODIUM CHLORIDE 0.9% FLUSH
10.0000 mL | Freq: Two times a day (BID) | INTRAVENOUS | Status: DC
  Administered 2022-06-23 – 2022-06-25 (×6): 10 mL
  Administered 2022-06-26: 40 mL
  Administered 2022-06-26 – 2022-07-06 (×16): 10 mL
  Administered 2022-07-06: 20 mL
  Administered 2022-07-07: 10 mL

## 2022-06-23 MED ORDER — EPHEDRINE SULFATE (PRESSORS) 50 MG/ML IJ SOLN
INTRAMUSCULAR | Status: DC | PRN
  Administered 2022-06-23 (×4): 5 mg via INTRAVENOUS

## 2022-06-23 MED ORDER — VASOPRESSIN 20 UNIT/ML IV SOLN
INTRAVENOUS | Status: DC | PRN
  Administered 2022-06-23: 2 [IU] via INTRAVENOUS
  Administered 2022-06-23: 1 [IU] via INTRAVENOUS

## 2022-06-23 MED ORDER — CEFAZOLIN SODIUM-DEXTROSE 2-3 GM-%(50ML) IV SOLR
INTRAVENOUS | Status: DC | PRN
  Administered 2022-06-23: 2 g via INTRAVENOUS

## 2022-06-23 MED ORDER — FENTANYL CITRATE (PF) 100 MCG/2ML IJ SOLN
INTRAMUSCULAR | Status: DC | PRN
  Administered 2022-06-23 (×4): 25 ug via INTRAVENOUS

## 2022-06-23 MED ORDER — CHLORHEXIDINE GLUCONATE CLOTH 2 % EX PADS
6.0000 | MEDICATED_PAD | Freq: Every day | CUTANEOUS | Status: DC
  Administered 2022-06-23 – 2022-07-18 (×24): 6 via TOPICAL

## 2022-06-23 MED ORDER — ENOXAPARIN SODIUM 40 MG/0.4ML IJ SOSY
40.0000 mg | PREFILLED_SYRINGE | INTRAMUSCULAR | Status: DC
  Administered 2022-06-24 – 2022-07-08 (×15): 40 mg via SUBCUTANEOUS
  Filled 2022-06-23 (×15): qty 0.4

## 2022-06-23 MED ORDER — PHENYLEPHRINE HCL-NACL 20-0.9 MG/250ML-% IV SOLN
INTRAVENOUS | Status: DC | PRN
  Administered 2022-06-23: 25 ug/min via INTRAVENOUS

## 2022-06-23 MED ORDER — SODIUM CHLORIDE 0.9% FLUSH: 10.0000 mL | INTRAVENOUS | Status: DC | PRN

## 2022-06-23 MED ORDER — PROPOFOL 10 MG/ML IV BOLUS
INTRAVENOUS | Status: DC | PRN
  Administered 2022-06-23: 50 mg via INTRAVENOUS

## 2022-06-23 MED ORDER — GLYCOPYRROLATE 0.2 MG/ML IJ SOLN
INTRAMUSCULAR | Status: DC | PRN
  Administered 2022-06-23: .2 mg via INTRAVENOUS

## 2022-06-23 MED ORDER — LIDOCAINE HCL (CARDIAC) PF 100 MG/5ML IV SOSY
PREFILLED_SYRINGE | INTRAVENOUS | Status: DC | PRN
  Administered 2022-06-23: 40 mg via INTRAVENOUS

## 2022-06-23 MED ORDER — SODIUM CHLORIDE (PF) 0.9 % IJ SOLN
INTRAMUSCULAR | Status: DC | PRN
  Administered 2022-06-23: 30 mL

## 2022-06-23 MED ORDER — ROCURONIUM BROMIDE 100 MG/10ML IV SOLN
INTRAVENOUS | Status: DC | PRN
  Administered 2022-06-23: 30 mg via INTRAVENOUS
  Administered 2022-06-23: 20 mg via INTRAVENOUS

## 2022-06-23 MED ORDER — ONDANSETRON HCL 4 MG/2ML IJ SOLN
INTRAMUSCULAR | Status: DC | PRN
  Administered 2022-06-23 (×2): 4 mg via INTRAVENOUS

## 2022-06-23 MED ORDER — LACTATED RINGERS IV SOLN: INTRAVENOUS | Status: DC | PRN

## 2022-06-23 MED ORDER — SUGAMMADEX SODIUM 200 MG/2ML IV SOLN
INTRAVENOUS | Status: DC | PRN
  Administered 2022-06-23: 200 mg via INTRAVENOUS

## 2022-06-23 SURGICAL SUPPLY — 65 items
APL PRP STRL LF DISP 70% ISPRP (MISCELLANEOUS) ×2
CELL SAVER LIPIGURD (MISCELLANEOUS) IMPLANT
CHLORAPREP W/TINT 26 (MISCELLANEOUS) ×3 IMPLANT
DEVICE RETRIEVAL ALEXIS 14 (MISCELLANEOUS) IMPLANT
DRAIN CHANNEL JP 15F RND 16 (MISCELLANEOUS) IMPLANT
DRAPE LAPAROTOMY 100X77 ABD (DRAPES) ×3 IMPLANT
DRSG OPSITE POSTOP 4X10 (GAUZE/BANDAGES/DRESSINGS) IMPLANT
DRSG OPSITE POSTOP 4X8 (GAUZE/BANDAGES/DRESSINGS) IMPLANT
DRSG TEGADERM 4X10 (GAUZE/BANDAGES/DRESSINGS) IMPLANT
DRSG TELFA 3X8 NADH STRL (GAUZE/BANDAGES/DRESSINGS) IMPLANT
ELECT BLADE 6.5 EXT (BLADE) ×3 IMPLANT
ELECT CAUTERY BLADE 6.4 (BLADE) ×3 IMPLANT
ELECT REM PT RETURN 9FT ADLT (ELECTROSURGICAL) ×2
ELECTRODE REM PT RTRN 9FT ADLT (ELECTROSURGICAL) ×3 IMPLANT
EXTRT SYSTEM ALEXIS 14CM (MISCELLANEOUS)
EXTRT SYSTEM ALEXIS 17CM (MISCELLANEOUS)
GAUZE 4X4 16PLY ~~LOC~~+RFID DBL (SPONGE) ×3 IMPLANT
GLOVE BIOGEL PI IND STRL 7.0 (GLOVE) ×9 IMPLANT
GLOVE SURG SYN 6.5 ES PF (GLOVE) ×8 IMPLANT
GLOVE SURG SYN 6.5 PF PI (GLOVE) ×9 IMPLANT
GOWN STRL REUS W/ TWL LRG LVL3 (GOWN DISPOSABLE) ×12 IMPLANT
GOWN STRL REUS W/TWL LRG LVL3 (GOWN DISPOSABLE) ×8
HANDLE SUCTION POOLE (INSTRUMENTS) ×3 IMPLANT
HOLDER FOLEY CATH W/STRAP (MISCELLANEOUS) ×3 IMPLANT
KIT OSTOMY 2 PC DRNBL 2.25 STR (WOUND CARE) IMPLANT
KIT OSTOMY DRAINABLE 2.25 STR (WOUND CARE) ×2
KIT TURNOVER KIT A (KITS) ×3 IMPLANT
LABEL OR SOLS (LABEL) ×3 IMPLANT
LIGASURE IMPACT 36 18CM CVD LR (INSTRUMENTS) ×3 IMPLANT
LIGHT WAVEGUIDE WIDE FLAT (MISCELLANEOUS) ×3 IMPLANT
MANIFOLD NEPTUNE II (INSTRUMENTS) ×3 IMPLANT
NEEDLE HYPO 22GX1.5 SAFETY (NEEDLE) ×3 IMPLANT
NS IRRIG 1000ML POUR BTL (IV SOLUTION) ×3 IMPLANT
PACK BASIN MAJOR ARMC (MISCELLANEOUS) ×3 IMPLANT
PACK COLON CLEAN CLOSURE (MISCELLANEOUS) ×3 IMPLANT
RELOAD GRN CONTOUR (ENDOMECHANICALS) ×2 IMPLANT
RELOAD STAPLE 40 GRN THCK (ENDOMECHANICALS) IMPLANT
RETRACTOR WND ALEXIS-O 25 LRG (MISCELLANEOUS) IMPLANT
RETRACTOR WOUND ALXS 18CM MED (MISCELLANEOUS) IMPLANT
RTRCTR WOUND ALEXIS O 18CM MED (MISCELLANEOUS) ×2
RTRCTR WOUND ALEXIS O 25CM LRG (MISCELLANEOUS)
SOL PREP PVP 2OZ (MISCELLANEOUS) ×2
SOLUTION PREP PVP 2OZ (MISCELLANEOUS) ×3 IMPLANT
SPONGE T-LAP 18X18 ~~LOC~~+RFID (SPONGE) ×9 IMPLANT
STAPLER CVD CUT GN 40 RELOAD (ENDOMECHANICALS) ×2 IMPLANT
STAPLER CVD CUT GRN 40 RELOAD (ENDOMECHANICALS) IMPLANT
STAPLER SKIN PROX 35W (STAPLE) ×3 IMPLANT
SUCTION POOLE HANDLE (INSTRUMENTS)
SURGILUBE 2OZ TUBE FLIPTOP (MISCELLANEOUS) IMPLANT
SUT PDS AB 1 TP1 54 (SUTURE) IMPLANT
SUT PROLENE 2 0 SH DA (SUTURE) IMPLANT
SUT SILK 2 0 (SUTURE)
SUT SILK 2-0 18XBRD TIE 12 (SUTURE) IMPLANT
SUT SILK 3 0 (SUTURE)
SUT SILK 3-0 (SUTURE) ×3 IMPLANT
SUT SILK 3-0 18XBRD TIE 12 (SUTURE) IMPLANT
SUT VIC AB 3-0 SH 27 (SUTURE)
SUT VIC AB 3-0 SH 27X BRD (SUTURE) IMPLANT
SUT VIC AB 3-0 SH 8-18 (SUTURE) IMPLANT
SYR 20ML LL LF (SYRINGE) ×3 IMPLANT
SYSTEM CONTND EXTRCTN KII BLLN (MISCELLANEOUS) IMPLANT
TOWEL OR 17X26 4PK STRL BLUE (TOWEL DISPOSABLE) ×3 IMPLANT
TRAP FLUID SMOKE EVACUATOR (MISCELLANEOUS) ×3 IMPLANT
TRAY FOLEY SLVR 16FR LF STAT (SET/KITS/TRAYS/PACK) ×3 IMPLANT
WATER STERILE IRR 500ML POUR (IV SOLUTION) ×3 IMPLANT

## 2022-06-23 NOTE — Anesthesia Procedure Notes (Signed)
Arterial Line Insertion Start/End12/05/2022 11:40 AM, 06/23/2022 11:42 AM Performed by: Darrin Nipper, MD, anesthesiologist  Patient location: OR. Preanesthetic checklist: patient identified, IV checked, site marked, risks and benefits discussed, surgical consent, monitors and equipment checked, pre-op evaluation, timeout performed and anesthesia consent Patient sedated Left, radial was placed Catheter size: 20 G Hand hygiene performed  and maximum sterile barriers used   Attempts: 1 Procedure performed without using ultrasound guided technique. Following insertion, dressing applied. Post procedure assessment: normal and unchanged  Patient tolerated the procedure well with no immediate complications.

## 2022-06-23 NOTE — Transfer of Care (Signed)
Immediate Anesthesia Transfer of Care Note  Patient: [name]  Procedure(s) Performed: COLON RESECTION SIGMOID COLOSTOMY  Patient Location: PACU  Anesthesia Type:General  Level of Consciousness: awake, drowsy, and patient cooperative  Airway & Oxygen Therapy: Patient Spontanous Breathing and Patient connected to face mask oxygen  Post-op Assessment: Report given to RN and Post -op Vital signs reviewed and stable  Post vital signs: Reviewed and stable  Last Vitals:  Vitals Value Taken Time  BP 143/97 06/23/22 1334  Temp    Pulse 81 06/23/22 1338  Resp 12 06/23/22 1338  SpO2 100 % 06/23/22 1338  Vitals shown include unvalidated device data.  Last Pain:  Vitals:   06/23/22 1101  TempSrc: Temporal  PainSc: 0-No pain         Complications:  Encounter Notable Events  Notable Event Outcome Phase Comment  Difficult to intubate - expected  Intraprocedure Filed from anesthesia note documentation.

## 2022-06-23 NOTE — Anesthesia Postprocedure Evaluation (Signed)
Anesthesia Post Note  Patient: Carl Bell  Procedure(s) Performed: COLON RESECTION SIGMOID COLOSTOMY  Patient location during evaluation: PACU Anesthesia Type: General Level of consciousness: awake and alert, oriented and patient cooperative Pain management: pain level controlled Vital Signs Assessment: post-procedure vital signs reviewed and stable Respiratory status: spontaneous breathing, nonlabored ventilation and respiratory function stable Cardiovascular status: blood pressure returned to baseline and stable Postop Assessment: adequate PO intake Anesthetic complications: yes   Encounter Notable Events  Notable Event Outcome Phase Comment  Difficult to intubate - expected  Intraprocedure Filed from anesthesia note documentation.     Last Vitals:  Vitals:   06/23/22 1345 06/23/22 1400  BP: (!) 131/94 (!) 129/94  Pulse: 80 78  Resp:  13  Temp:    SpO2: 100% 100%    Last Pain:  Vitals:   06/23/22 1400  TempSrc:   PainSc: Asleep                 Darrin Nipper

## 2022-06-23 NOTE — Interval H&P Note (Signed)
History and Physical Interval Note:  06/23/2022 10:42 AM  Carl Bell  has presented today for surgery, with the diagnosis of Diverticulitis.  The various methods of treatment have been discussed with the patient and family. After consideration of risks, benefits and other options for treatment, the patient has consented to  Procedure(s): COLON RESECTION SIGMOID (N/A) as a surgical intervention.  The patient's history has been reviewed, patient examined, no change in status, stable for surgery.  I have reviewed the patient's chart and labs.  Questions were answered to the patient's satisfaction.     Shaniqwa Horsman Lysle Pearl

## 2022-06-23 NOTE — Op Note (Signed)
Pre-Op Dx: Sigmoid volvulus  Post-Op Dx: Same anesthesia: GETA EBL: 30 Complications:  none apparent Specimen: Sigmoid colon  procedure: Hartman's procedure   Surgeon: Lysle Pearl  Indications for procedure: Sigmoid volvulus status post decompression.  Description of Procedure:  Consent obtained, time out performed.  Patient placed in supine position.  Foley placed area sterilized and draped in usual position.  Infraumbilical midline incision made and dissection carried down to fascia and peritoneal cavity entered.  Dilated sigmoid colon immediately noted.  This was traced down to somewhat decrease in caliber rectum.  Hold made in the mesentery to accommodate green load contour stapler and colon transected.  Sigmoid mesentery was then transected using LigaSure to decrease in caliber descending colon.  Hole in the mesentery made at this point to accommodate green load contour stapler and colon transected.  Sigmoid colon specimen removed off operative field pending pathology.  Rectal stump marked with 3-0 PDS x 1.  Skin this excised in left upper quadrant at preplanned colostomy site and dissection carried down through the fascia to accommodate 2 fingerbreadths.  Proximal colon was then brought through this defect under minimal tension.  1-0 PDS x2 was then used to close the midline incision.  Exparel then infused along the incision site and around ostomy site.  Skin then closed with staples.  Wound then dressed with honeycomb dressing.  Staple line of proximal colon removed and ostomy was matured using interrupted 3-0 Vicryl circumferentially.  Care was noted to ensure the colon not twisted and the mucosa viable throughout the entire process.  Newly created ostomy was then dressed with ostomy appliance.  Pt tolerated procedure well, and transferred to PACU in stable condition, Foley and NG in place.  Sponge and instrument count correct at end of procedure.

## 2022-06-23 NOTE — Progress Notes (Signed)
Per Dr. Erenest Rasher order, A-line can be discontinued at this time. Pt tolerated well. Pressure applied on left wrist until bleeding stopped. Gauze and paper tape applied.

## 2022-06-23 NOTE — Consult Note (Signed)
WOC consulted for new end colostomy; will follow up for post op ostomy care Wednesday, no WOC on the [facility] campus 12/12.   [place], [place], [place]

## 2022-06-23 NOTE — Plan of Care (Signed)

## 2022-06-23 NOTE — Progress Notes (Signed)
Patient refused to let us check to see if he had a BM that needed to be cleaned up. Patient stated he was fine.

## 2022-06-23 NOTE — Anesthesia Procedure Notes (Signed)
Procedure Name: Intubation Date/Time: 06/23/2022 11:24 AM  Performed by: Kelton Pillar, CRNAPre-anesthesia Checklist: Patient identified, Emergency Drugs available, Suction available and Patient being monitored Patient Re-evaluated:Patient Re-evaluated prior to induction Oxygen Delivery Method: Circle system utilized Preoxygenation: Pre-oxygenation with 100% oxygen Induction Type: IV induction Ventilation: Mask ventilation without difficulty Laryngoscope Size: McGraph and 3 Grade View: Grade I Tube type: Oral Tube size: 6.5 mm Number of attempts: 1 Airway Equipment and Method: Stylet and Oral airway Placement Confirmation: ETT inserted through vocal cords under direct vision, positive ETCO2, breath sounds checked- equal and bilateral and CO2 detector Secured at: 21 cm Tube secured with: Tape Dental Injury: Teeth and Oropharynx as per pre-operative assessment  Difficulty Due To: Difficulty was anticipated and Difficult Airway- due to dentition

## 2022-06-23 NOTE — Progress Notes (Signed)
Subjective:  CC: Carl Bell is a 71 y.o. male  Hospital stay day 1, Day of Surgery sigmoid volvulus  HPI: No complaints overnight.    ROS:  General: Denies weight loss, weight gain, fatigue, fevers, chills, and night sweats. Heart: Denies chest pain, palpitations, racing heart, irregular heartbeat, leg pain or swelling, and decreased activity tolerance. Respiratory: Denies breathing difficulty, shortness of breath, wheezing, cough, and sputum. GI: Denies change in appetite, heartburn, nausea, vomiting, constipation, diarrhea, and blood in stool. GU: Denies difficulty urinating, pain with urinating, urgency, frequency, blood in urine.   Objective:   Temp:  [96.6 F (35.9 C)-98.1 F (36.7 C)] 98.1 F (36.7 C) (12/11 0421) Pulse Rate:  [65-94] 83 (12/11 0736) Resp:  [15-23] 18 (12/11 0736) BP: (119-159)/(69-120) 159/93 (12/11 0736) SpO2:  [92 %-100 %] 92 % (12/11 0736)     Height: 6\' 1"  (185.4 cm) Weight: 53.1 kg BMI (Calculated): 15.44   Intake/Output this shift:   Intake/Output Summary (Last 24 hours) at 06/23/2022 1039 Last data filed at 06/23/2022 1014 Gross per 24 hour  Intake 1687.79 ml  Output 350 ml  Net 1337.79 ml    Constitutional :  alert, cooperative, appears stated age, and no distress  Respiratory:  clear to auscultation bilaterally  Cardiovascular:  regular rate and rhythm  Gastrointestinal: soft, non-tender; bowel sounds normal; no masses,  no organomegaly.   Skin: Cool and moist.   Psychiatric: Normal affect, non-agitated, not confused       LABS:     Latest Ref Rng & Units 06/23/2022    5:22 AM 06/21/2022   11:09 PM 04/13/2022    7:03 PM  CMP  Glucose 70 - 99 mg/dL 74  101  234   BUN 8 - 23 mg/dL 21  31  18    Creatinine 0.61 - 1.24 mg/dL 0.76  1.06  0.81   Sodium 135 - 145 mmol/L 139  136  139   Potassium 3.5 - 5.1 mmol/L 4.0  3.3  4.4   Chloride 98 - 111 mmol/L 104  97  96   CO2 22 - 32 mmol/L 21  24  25    Calcium 8.9 - 10.3 mg/dL 8.3  9.0   9.6   Total Protein 6.5 - 8.1 g/dL  6.7  7.3   Total Bilirubin 0.3 - 1.2 mg/dL  1.4  0.7   Alkaline Phos 38 - 126 U/L  53  50   AST 15 - 41 U/L  17  27   ALT 0 - 44 U/L  7  10       Latest Ref Rng & Units 06/23/2022    5:22 AM 06/22/2022    5:00 PM 06/21/2022   11:09 PM  CBC  WBC 4.0 - 10.5 K/uL 6.1  5.8  5.8   Hemoglobin 13.0 - 17.0 g/dL 11.6  9.8  11.0   Hematocrit 39.0 - 52.0 % 36.3  30.9  34.5   Platelets 150 - 400 K/uL 292  274  362     RADS: N/a Assessment:   Sigmoid volvulus, s/p decompression colonoscopy  Stable.  Discussed proceeding with sigmoidectomy, possible ostomy to prevent recurence.  Pt agreeable.  The risk of surgery include, but not limited to, recurrence, bleeding, chronic pain, post-op infxn, post-op SBO or ileus, hernias, resection of bowel, re-anastamosis, possible ostomy placement and need for re-operation to address said risks. The risks of general anesthetic, if used, includes MI, CVA, sudden death or even reaction to anesthetic medications also  discussed. Alternatives include continued observation and NG decompression.  Benefits include possible symptom relief, preventing further decline in health and possible death.  Typical post-op recovery time of additional days in hospital for observation afterwards also discussed.    The patient verbalized understanding and all questions were answered to the patient's satisfaction.  labs/images/medications/previous chart entries reviewed personally and relevant changes/updates noted above.

## 2022-06-23 NOTE — Progress Notes (Signed)
[facility] Progress Note  Patient: Carl Bell    RUE:[id number]  DOA: 06/22/2022     Date of Service: the patient was seen and examined on 06/23/2022  Chief Complaint  Patient presents with   Constipation   Brief hospital course: ANTJUAN ROTHE is a 71 y.o. male with past medical history significant for COPD with chronic respiratory failure on 2 L of oxygen, hypertension, history of lung cancer status post chemoradiation therapy, history of liver cirrhosis and hypertension who presents to the ER for evaluation of a 3-day history of constipation associated with diffuse abdominal pain with localization to the periumbilical area rated an 8 x 10 in intensity at its worst.  There is no radiation of the pain but it is associated with nausea and vomiting.  He admits to a 33-month unintentional 50 pound weight loss.  He denies having any alleviating or aggravating factors. He denies having any chest pain, no dizziness, no lightheadedness, no urinary symptoms, no leg swelling, no headache, no dizziness, no lightheadedness, no blurred vision no focal deficit. Patient had a CT scan of abdomen and pelvis which showed high-grade distal colonic obstruction due to sigmoid volvulus. Large right groin hernia containing cecum, ileum, and ascites. Chronic calcific pancreatitis.  14 mm presumed pseudocyst. Bilateral complicated renal cysts that are size stable from a PET CT in 2017. Advanced atherosclerosis. Patient has been admitted to the surgical service and medical consult requested to manage patient's multiple comorbidities.   Assessment and Plan:  * Volvulus of intestine ([place]) Patient presents to the ER for evaluation of constipation associated with diffuse abdominal pain, nausea and vomiting and is found to have a high-grade distal colonic obstruction due to sigmoid volvulus.  Patient admitted to the surgical service and further treatment plan per surgery 12/11 s/p sigmoid  colectomy  Hypertension Patient developed hypotension postop most likely due to anesthesia Continue IV fluid for hydration Monitor BP Patient is on hydralazine as needed    COPD (chronic obstructive pulmonary disease) ([place]) Patient has a history of COPD with chronic respiratory failure on 2 L of oxygen continuous. Stable and not acutely exacerbated Continue as needed bronchodilator therapy as well as inhaled steroids Continue oxygen supplementation to maintain pulse oximetry greater than 92%    Body mass index is 15.44 kg/m.  Interventions:       Diet: NPO DVT Prophylaxis: Subcutaneous Lovenox   Advance goals of care discussion: Full code  Family Communication: family was present at bedside, at the time of interview.  The pt provided permission to discuss medical plan with the family. Opportunity was given to ask question and all questions were answered satisfactorily.   Disposition:  Pt is from Home, admitted with abdominal pain found to have sigmoid volvulus, admitted by general surgery s/p colectomy done today, patient is n.p.o., which precludes a safe discharge. Discharge to home versus SNF, may need PT/OT eval, when stable..  Subjective: Patient was seen and examined at bedside after sigmoid colectomy, patient was sleepy most likely due to anesthesia, patient had low blood pressure was on IV fluid and hypothermic.  We will continue to monitor.  Patient was complaining of pain 7/10 which is expected after surgery.  Patient was unable to offer any other complaints due to still not fully awake from anesthesia.  Physical Exam: General: Sleepy, lethargic postanesthesia. Marland Kitchen  Appear in mild distress,  Eyes: PERRLA ENT: Oral Mucosa Clear, moist  Neck: no JVD,  Cardiovascular: S1 and S2 Present, no Murmur,  Respiratory:  good respiratory effort, Bilateral Air entry equal and Decreased, no Crackles, no wheezes Abdomen: Bowel Sound sluggish, postop tenderness  Skin: No  rashes Extremities: No pedal edema, no calf tenderness Neurologic: without any new focal findings Gait not checked due to patient safety concerns  Vitals:   06/23/22 1500 06/23/22 1515 06/23/22 1610 06/23/22 1619  BP: (!) 138/94 (!) 126/91 94/65 104/71  Pulse: 77 79 79   Resp: 13 14 16    Temp:  (!) 97 F (36.1 C)    TempSrc:      SpO2: 100% 100% 100%   Weight:      Height:        Intake/Output Summary (Last 24 hours) at 06/23/2022 1640 Last data filed at 06/23/2022 1632 Gross per 24 hour  Intake 1832.6 ml  Output 730 ml  Net 1102.6 ml   Filed Weights   06/21/22 2242 06/22/22 1028 06/23/22 1101  Weight: 56.3 kg 53.1 kg 53.1 kg    Data Reviewed: I have personally reviewed and interpreted daily labs, tele strips, imagings as discussed above. I reviewed all nursing notes, pharmacy notes, vitals, pertinent old records I have discussed plan of care as described above with RN and patient/family.  CBC: Recent Labs  Lab 06/21/22 2309 06/22/22 1700 06/23/22 0522  WBC 5.8 5.8 6.1  HGB 11.0* 9.8* 11.6*  HCT 34.5* 30.9* 36.3*  MCV 76.2* 75.9* 76.3*  PLT 362 274 728   Basic Metabolic Panel: Recent Labs  Lab 06/21/22 2309 06/22/22 1223 06/23/22 0522  NA 136  --  139  K 3.3*  --  4.0  CL 97*  --  104  CO2 24  --  21*  GLUCOSE 101*  --  74  BUN 31*  --  21  CREATININE 1.06  --  0.76  CALCIUM 9.0  --  8.3*  MG  --  1.7 1.7  PHOS  --   --  2.6    Studies: No results found.  Scheduled Meds:  Chlorhexidine Gluconate Cloth  6 each Topical Daily   dorzolamide-timolol  1 drop Both Eyes BID   [START ON 06/24/2022] enoxaparin (LOVENOX) injection  40 mg Subcutaneous Q24H   fluticasone furoate-vilanterol  1 puff Inhalation Daily   And   umeclidinium bromide  1 puff Inhalation Daily   latanoprost  1 drop Both Eyes QHS   mirtazapine  7.5 mg Oral QHS   sodium chloride flush  10-40 mL Intracatheter Q12H   Continuous Infusions:  0.9 % NaCl with KCl 40 mEq / L 100 mL/hr  at 06/23/22 1632   PRN Meds: albuterol, hydrALAZINE, HYDROcodone-acetaminophen, ipratropium-albuterol, morphine injection, ondansetron **OR** ondansetron (ZOFRAN) IV, sodium chloride flush, traMADol  Time spent: 35 minutes  Author: Val Riles. MD [facility] 06/23/2022 4:40 PM  To reach On-call, see care teams to locate the attending and reach out to them via [email or web address]. If 7PM-7AM, please contact night-coverage If you still have difficulty reaching the attending provider, please page the [facility] (Director on Call) for [facility] on amion for assistance.

## 2022-06-23 NOTE — Progress Notes (Signed)
[facility] Hospital Liaison note:  Notified of request for [facility] services after Hospice revocation. Will continue to follow for disposition.  Please call with any outpatient palliative questions or concerns.  Thank you for the opportunity to participate in this patient's care.  Thank you, Lorelee Market, LPN [facility] Liaison [phone]

## 2022-06-23 NOTE — Anesthesia Preprocedure Evaluation (Addendum)
Anesthesia Evaluation  Patient identified by MRN, date of birth, ID band Patient awake    Reviewed: Allergy & Precautions, NPO status , Patient's Chart, lab work & pertinent test results  History of Anesthesia Complications Negative for: history of anesthetic complications  Airway Mallampati: III   Neck ROM: Full    Dental  (+) Missing, Loose   Pulmonary COPD (home O2 2L continuously), former smoker (quit few months ago) Lung CA   Pulmonary exam normal breath sounds clear to auscultation       Cardiovascular hypertension, + Peripheral Vascular Disease  Normal cardiovascular exam Rhythm:Regular Rate:Normal  ECG 06/22/22:  Sinus rhythm Borderline right axis deviation Nonspecific T abnrm, anterolateral leads Minimal ST elevation, inferior leads Prolonged QT interval   Neuro/Psych Blindness     GI/Hepatic negative GI ROS,,,(+) Cirrhosis         Endo/Other  negative endocrine ROS    Renal/GU negative Renal ROS     Musculoskeletal   Abdominal   Peds  Hematology negative hematology ROS (+)   Anesthesia Other Findings   Reproductive/Obstetrics                             Anesthesia Physical Anesthesia Plan  ASA: 4  Anesthesia Plan: General   Post-op Pain Management:    Induction: Intravenous  PONV Risk Score and Plan: 1 and Ondansetron, Dexamethasone and Treatment may vary due to age or medical condition  Airway Management Planned: Oral ETT  Additional Equipment:   Intra-op Plan:   Post-operative Plan: Extubation in OR  Informed Consent: I have reviewed the patients History and Physical, chart, labs and discussed the procedure including the risks, benefits and alternatives for the proposed anesthesia with the patient or authorized representative who has indicated his/her understanding and acceptance.     Dental advisory given  Plan Discussed with: CRNA  Anesthesia  Plan Comments: (Patient consented for risks of anesthesia including but not limited to:  - adverse reactions to medications - damage to eyes, teeth, lips or other oral mucosa - nerve damage due to positioning  - sore throat or hoarseness - damage to heart, brain, nerves, lungs, other parts of body or loss of life  Informed patient about role of CRNA in peri- and intra-operative care.  Patient voiced understanding.)        Anesthesia Quick Evaluation

## 2022-06-23 NOTE — H&P (View-Only) (Signed)
Subjective:  CC: Carl Bell is a 71 y.o. male  Hospital stay day 1, Day of Surgery sigmoid volvulus  HPI: No complaints overnight.    ROS:  General: Denies weight loss, weight gain, fatigue, fevers, chills, and night sweats. Heart: Denies chest pain, palpitations, racing heart, irregular heartbeat, leg pain or swelling, and decreased activity tolerance. Respiratory: Denies breathing difficulty, shortness of breath, wheezing, cough, and sputum. GI: Denies change in appetite, heartburn, nausea, vomiting, constipation, diarrhea, and blood in stool. GU: Denies difficulty urinating, pain with urinating, urgency, frequency, blood in urine.   Objective:   Temp:  [96.6 F (35.9 C)-98.1 F (36.7 C)] 98.1 F (36.7 C) (12/11 0421) Pulse Rate:  [65-94] 83 (12/11 0736) Resp:  [15-23] 18 (12/11 0736) BP: (119-159)/(69-120) 159/93 (12/11 0736) SpO2:  [92 %-100 %] 92 % (12/11 0736)     Height: 6\' 1"  (185.4 cm) Weight: 53.1 kg BMI (Calculated): 15.44   Intake/Output this shift:   Intake/Output Summary (Last 24 hours) at 06/23/2022 1039 Last data filed at 06/23/2022 1014 Gross per 24 hour  Intake 1687.79 ml  Output 350 ml  Net 1337.79 ml    Constitutional :  alert, cooperative, appears stated age, and no distress  Respiratory:  clear to auscultation bilaterally  Cardiovascular:  regular rate and rhythm  Gastrointestinal: soft, non-tender; bowel sounds normal; no masses,  no organomegaly.   Skin: Cool and moist.   Psychiatric: Normal affect, non-agitated, not confused       LABS:     Latest Ref Rng & Units 06/23/2022    5:22 AM 06/21/2022   11:09 PM 04/13/2022    7:03 PM  CMP  Glucose 70 - 99 mg/dL 74  101  234   BUN 8 - 23 mg/dL 21  31  18    Creatinine 0.61 - 1.24 mg/dL 0.76  1.06  0.81   Sodium 135 - 145 mmol/L 139  136  139   Potassium 3.5 - 5.1 mmol/L 4.0  3.3  4.4   Chloride 98 - 111 mmol/L 104  97  96   CO2 22 - 32 mmol/L 21  24  25    Calcium 8.9 - 10.3 mg/dL 8.3  9.0   9.6   Total Protein 6.5 - 8.1 g/dL  6.7  7.3   Total Bilirubin 0.3 - 1.2 mg/dL  1.4  0.7   Alkaline Phos 38 - 126 U/L  53  50   AST 15 - 41 U/L  17  27   ALT 0 - 44 U/L  7  10       Latest Ref Rng & Units 06/23/2022    5:22 AM 06/22/2022    5:00 PM 06/21/2022   11:09 PM  CBC  WBC 4.0 - 10.5 K/uL 6.1  5.8  5.8   Hemoglobin 13.0 - 17.0 g/dL 11.6  9.8  11.0   Hematocrit 39.0 - 52.0 % 36.3  30.9  34.5   Platelets 150 - 400 K/uL 292  274  362     RADS: N/a Assessment:   Sigmoid volvulus, s/p decompression colonoscopy  Stable.  Discussed proceeding with sigmoidectomy, possible ostomy to prevent recurence.  Pt agreeable.  The risk of surgery include, but not limited to, recurrence, bleeding, chronic pain, post-op infxn, post-op SBO or ileus, hernias, resection of bowel, re-anastamosis, possible ostomy placement and need for re-operation to address said risks. The risks of general anesthetic, if used, includes MI, CVA, sudden death or even reaction to anesthetic medications also  discussed. Alternatives include continued observation and NG decompression.  Benefits include possible symptom relief, preventing further decline in health and possible death.  Typical post-op recovery time of additional days in hospital for observation afterwards also discussed.    The patient verbalized understanding and all questions were answered to the patient's satisfaction.  labs/images/medications/previous chart entries reviewed personally and relevant changes/updates noted above.

## 2022-06-23 NOTE — Progress Notes (Signed)
   06/23/22 1700  Assess: MEWS Score  Temp (!) 93.9 F (34.4 C)  BP 107/73  MAP (mmHg) 84  Pulse Rate 78  Resp 15  Level of Consciousness Alert  SpO2 100 %  O2 Device Room Air  Assess: MEWS Score  MEWS Temp 2  MEWS Systolic 0  MEWS Pulse 0  MEWS RR 0  MEWS LOC 0  MEWS Score 2  MEWS Score Color Yellow  Assess: if the MEWS score is Yellow or Red  Were vital signs taken at a resting state? Yes  Focused Assessment No change from prior assessment  Does the patient meet 2 or more of the SIRS criteria? Yes  Does the patient have a confirmed or suspected source of infection? No  MEWS guidelines implemented *See Row Information* Yes  Notify: Charge Nurse/RN  Name of Charge Nurse/RN Notified Elfrida, [place]  Date Charge Nurse/RN Notified 06/23/22  Time Charge Nurse/RN Notified 1700  Provider Notification  Provider Name/Title Dr. Candie Chroman  Date Provider Notified 06/23/22  Time Provider Notified 1715  Method of Notification Page (secure chat)  Notification Reason Other (Comment) (low temp, placed on bair hugger)  Provider response No new orders (acknowledged)  Date of Provider Response 06/23/22  Time of Provider Response 1715  Assess: SIRS CRITERIA  SIRS Temperature  1  SIRS Pulse 0  SIRS Respirations  0  SIRS WBC 1  SIRS Score Sum  2

## 2022-06-24 ENCOUNTER — Encounter: Payer: Self-pay | Admitting: Surgery

## 2022-06-24 DIAGNOSIS — E875 Hyperkalemia: Secondary | ICD-10-CM | POA: Insufficient documentation

## 2022-06-24 DIAGNOSIS — K562 Volvulus: Secondary | ICD-10-CM | POA: Diagnosis not present

## 2022-06-24 DIAGNOSIS — D62 Acute posthemorrhagic anemia: Secondary | ICD-10-CM | POA: Insufficient documentation

## 2022-06-24 LAB — BASIC METABOLIC PANEL
Anion gap: 11 (ref 5–15)
Anion gap: 11 (ref 5–15)
BUN: 22 mg/dL (ref 8–23)
BUN: 21 mg/dL (ref 8–23)
CO2: 19 mmol/L — ABNORMAL LOW (ref 22–32)
CO2: 21 mmol/L — ABNORMAL LOW (ref 22–32)
Calcium: 8 mg/dL — ABNORMAL LOW (ref 8.9–10.3)
Calcium: 8.3 mg/dL — ABNORMAL LOW (ref 8.9–10.3)
Chloride: 115 mmol/L — ABNORMAL HIGH (ref 98–111)
Chloride: 111 mmol/L (ref 98–111)
Creatinine, Ser: 1.05 mg/dL (ref 0.61–1.24)
Creatinine, Ser: 1.13 mg/dL (ref 0.61–1.24)
GFR, Estimated: >60 mL/min (ref >60)
GFR, Estimated: >60 mL/min (ref >60)
Glucose, Bld: 75 mg/dL (ref 70–99)
Glucose, Bld: 133 mg/dL — ABNORMAL HIGH (ref 70–99)
Potassium: 7.1 mmol/L (ref 3.5–5.1)
Potassium: 5.1 mmol/L (ref 3.5–5.1)
Sodium: 145 mmol/L (ref 135–145)
Sodium: 143 mmol/L (ref 135–145)

## 2022-06-24 LAB — IRON AND TIBC
Iron: 11 ug/dL — ABNORMAL LOW (ref 45–182)
Saturation Ratios: 5 % — ABNORMAL LOW (ref 17.9–39.5)
TIBC: 228 ug/dL — ABNORMAL LOW (ref 250–450)
UIBC: 217 ug/dL

## 2022-06-24 LAB — GLUCOSE, CAPILLARY
Glucose-Capillary: 58 mg/dL — ABNORMAL LOW (ref 70–99)
Glucose-Capillary: 81 mg/dL (ref 70–99)

## 2022-06-24 LAB — RETICULOCYTES
Immature Retic Fract: 19.2 % — ABNORMAL HIGH (ref 2.3–15.9)
RBC.: 4.09 MIL/uL — ABNORMAL LOW (ref 4.22–5.81)
Retic Count, Absolute: 92.8 10*3/uL (ref 19.0–186.0)
Retic Ct Pct: 2.3 % (ref 0.4–3.1)

## 2022-06-24 LAB — CBC
HCT: 29.3 % — ABNORMAL LOW (ref 39.0–52.0)
Hemoglobin: 9.2 g/dL — ABNORMAL LOW (ref 13.0–17.0)
MCH: 24.1 pg — ABNORMAL LOW (ref 26.0–34.0)
MCHC: 31.4 g/dL (ref 30.0–36.0)
MCV: 76.7 fL — ABNORMAL LOW (ref 80.0–100.0)
Platelets: 254 10*3/uL (ref 150–400)
RBC: 3.82 MIL/uL — ABNORMAL LOW (ref 4.22–5.81)
RDW: 16.4 % — ABNORMAL HIGH (ref 11.5–15.5)
WBC: 8.5 10*3/uL (ref 4.0–10.5)
nRBC: 0 % (ref 0.0–0.2)

## 2022-06-24 LAB — FOLATE: Folate: 10.5 ng/mL (ref >5.9)

## 2022-06-24 LAB — VITAMIN B12: Vitamin B-12: 1816 pg/mL — ABNORMAL HIGH (ref 180–914)

## 2022-06-24 LAB — MAGNESIUM: Magnesium: 1.6 mg/dL — ABNORMAL LOW (ref 1.7–2.4)

## 2022-06-24 LAB — PHOSPHORUS: Phosphorus: 3.3 mg/dL (ref 2.5–4.6)

## 2022-06-24 LAB — FERRITIN: Ferritin: 64 ng/mL (ref 24–336)

## 2022-06-24 MED ORDER — MAGNESIUM SULFATE 4 GM/100ML IV SOLN
4.0000 g | Freq: Once | INTRAVENOUS | Status: AC
  Administered 2022-06-24: 4 g via INTRAVENOUS
  Filled 2022-06-24 (×2): qty 100

## 2022-06-24 MED ORDER — SODIUM CHLORIDE 0.9 % IV SOLN
250.0000 mg | Freq: Every day | INTRAVENOUS | Status: AC
  Administered 2022-06-24 – 2022-06-25 (×2): 250 mg via INTRAVENOUS
  Filled 2022-06-24 (×2): qty 20

## 2022-06-24 MED ORDER — SODIUM CHLORIDE 0.9 % IV BOLUS
500.0000 mL | Freq: Once | INTRAVENOUS | Status: AC
  Administered 2022-06-24: 500 mL via INTRAVENOUS

## 2022-06-24 MED ORDER — ALBUTEROL SULFATE (2.5 MG/3ML) 0.083% IN NEBU
10.0000 mg | INHALATION_SOLUTION | Freq: Once | RESPIRATORY_TRACT | Status: AC
  Administered 2022-06-24: 10 mg via RESPIRATORY_TRACT
  Filled 2022-06-24: qty 12

## 2022-06-24 MED ORDER — CALCIUM GLUCONATE-NACL 1-0.675 GM/50ML-% IV SOLN
1.0000 g | Freq: Once | INTRAVENOUS | Status: AC
  Administered 2022-06-24: 1000 mg via INTRAVENOUS
  Filled 2022-06-24 (×2): qty 50

## 2022-06-24 MED ORDER — DEXTROSE 10 % IV SOLN: Freq: Once | INTRAVENOUS | Status: AC

## 2022-06-24 NOTE — Progress Notes (Signed)
Consultation Progress Note   Patient: Carl Bell NTI:[id number] DOB: October 04, 1950 DOA: 06/22/2022 DOS: the patient was seen and examined on 06/24/2022 Primary service: [name], DO  Brief hospital course: Taken from prior notes.  Carl Bell is a 71 y.o. male with past medical history significant for COPD with chronic respiratory failure on 2 L of oxygen, hypertension, history of lung cancer status post chemoradiation therapy, history of liver cirrhosis and hypertension who presents to the ER for evaluation of a 3-day history of constipation associated with diffuse abdominal pain with localization to the periumbilical area rated an 8 x 10 in intensity at its worst.  There is no radiation of the pain but it is associated with nausea and vomiting.  He admits to a 34-month unintentional 50 pound weight loss.  He denies having any alleviating or aggravating factors.   CT scan of abdomen and pelvis which showed high-grade distal colonic obstruction due to sigmoid volvulus. Large right groin hernia containing cecum, ileum, and ascites. Chronic calcific pancreatitis.  14 mm presumed pseudocyst. Bilateral complicated renal cysts that are size stable from a PET CT in 2017. Advanced atherosclerosis.   Patient has been admitted to the surgical service and medical consult requested to manage patient's multiple comorbidities.   S/p sigmoid colectomy on 06/23/2022.  12/12: Hemodynamically stable but labs pertinent for potassium of 7.1-ordered EKG, calcium gluconate, albuterol and insulin per hyperkalemia order set protocol.  Patient is n.p.o. so cannot use Lokelma or Kayexalate.  Apparently patient was on IV infusion with potassium which was immediately discontinued. Magnesium was also low at 1.6-replating.  CBC with decrease of hemoglobin to 9.2 postoperatively, checking anemia panel, which shows anemia of chronic disease with some iron deficiency. Ordered IV iron as patient is n.p.o. Patient was  started on clear liquid diet after having stool in colostomy bag.    Assessment and Plan: * Volvulus of intestine (HCC) S/p sigmoid colectomy.  Having bowel movement in colostomy bag. -Being started on clear liquid diet by general surgery. -Management by general surgery  Acute postoperative anemia due to expected blood loss Hemoglobin decreased to 9.2 postsurgically.  Anemia panel with anemia of chronic disease with some iron deficiency. -Ordered 2 doses of IV iron -Monitor hemoglobin -Transfuse if below 7  Hyperkalemia Potassium at 7.1, apparently he was on potassium infusion which was immediately discontinued.  No EKG changes. -Calcium gluconate, albuterol and insulin with D50. -Repeat potassium at 5.1 -Continue to monitor  Hypertension Currently blood pressure within goal. -IV hydralazine as needed  COPD (chronic obstructive pulmonary disease) ([place]) Patient has a history of COPD with chronic respiratory failure on 2 L of oxygen continuous. Stable and not acutely exacerbated Continue as needed bronchodilator therapy as well as inhaled steroids Continue oxygen supplementation to maintain pulse oximetry greater than 92%   Hypokalemia Resolved.  Now had hyperkalemia. -Please see above   DVT prophylaxis.  Lovenox  TRH will continue to follow the patient.  Subjective: Patient was seen and examined today.  Denies any pain.  Still had clamped NG tube.  Physical Exam: Vitals:   06/24/22 0500 06/24/22 0745 06/24/22 1135 06/24/22 1535  BP: 103/74 105/71 127/73 110/75  Pulse: 82 86 93 66  Resp: 17 18 20 18   Temp: 98.2 F (36.8 C) 97.7 F (36.5 C)  98.1 F (36.7 C)  TempSrc: Oral     SpO2: 100%  100% 99%  Weight:      Height:       General.  Cachectic  gentleman, in no acute distress. Pulmonary.  Lungs clear bilaterally, normal respiratory effort. CV.  Regular rate and rhythm, no JVD, rub or murmur. Abdomen.  Soft, colostomy bag with feces in place, midline incision  with clean bandage CNS.  Alert and oriented .  No focal neurologic deficit. Extremities.  No edema, no cyanosis, pulses intact and symmetrical. Psychiatry.  Judgment and insight appears normal.   Data Reviewed: Prior data reviewed  Family Communication: Being done by primary team  Time spent: 50 minutes.  This record has been created using Systems analyst. Errors have been sought and corrected,but may not always be located. Such creation errors do not reflect on the standard of care.   Author: Lorella Nimrod, MD 06/24/2022 4:48 PM  For on call review [email or web address].

## 2022-06-24 NOTE — Progress Notes (Signed)
OT Cancellation Note  Patient Details Name: KATHERINE SYME MRN: [id number] DOB: [birth year]   Cancelled Treatment:    Reason Eval/Treat Not Completed: Medical issues which prohibited therapy. Chart reviewed - pt noted to have K+ critically high at 7.1; contraindicated for exertional activity at this time. Will continue to follow and initiate services as pt medically appropriate to participate in therapy.   [name], M.S. OTR/L  06/24/22, 8:51 AM  ascom [phone]

## 2022-06-24 NOTE — Progress Notes (Signed)
PT Cancellation Note  Patient Details Name: Carl Bell MRN: [id number] DOB: June 22, 1951   Cancelled Treatment:    Reason Eval/Treat Not Completed: Other (comment). Pt noted to have elevated K+ at 7.1, outside of therapy protocol for exertional activity. Will re-attempt once medically stable.   Charlei Ramsaran 06/24/2022, 8:52 AM [name], PT, DPT, GCS [phone]

## 2022-06-24 NOTE — TOC Initial Note (Signed)
Transition of Care [facility]) - Initial/Assessment Note    Patient Details  Name: Carl Bell MRN: [id number] Date of Birth: 18-Jun-1951  Transition of [facility]) CM/SW Contact:    Candie Chroman, LCSW Phone Number: 06/24/2022, 11:09 AM  Clinical Narrative:  Readmission prevention screen complete. CSW met with patient. No supports at bedside. CSW introduced role and explained that discharge planning would be discussed. PCP is Frazier Richards, MD. His siblings typically transport him to appointments and will transport him home at discharge. He uses [employer] [facility]. No issues obtaining medications. Patient lives home with his parents. He stated he was receiving home health prior to admission through [facility] but he is unable to give me name of agency and patient has [employer]. Patient appears slightly confused. Unable to find active home health service information in Patient [place] or chart review. He was active with [facility] in May. Patient stated he has a cane, BSC, and shower chair at home but does not use them. PT/OT evaluations are pending. Will follow up once recommendations are made. Anticipate needing at least a home health RN for new colostomy. No further concerns. CSW encouraged patient to contact CSW as needed. CSW will continue to follow patient for support and facilitate discharge once medically stable.                Expected Discharge Plan: [place] Barriers to Discharge: Continued Medical Work up   Patient Goals and CMS Choice        Expected Discharge Plan and Services Expected Discharge Plan: [place] Acute Care Choice:  (TBD) Living arrangements for the past 2 months: Single Family Home                                      Prior Living Arrangements/Services Living arrangements for the past 2 months: Single Family Home Lives with:: Parents Patient language and need for interpreter reviewed:: Yes Do you feel  safe going back to the place where you live?: Yes      Need for Family Participation in Patient Care: Yes (Comment) Care giver support system in place?: Yes (comment) Current home services: DME Criminal Activity/Legal Involvement Pertinent to Current Situation/Hospitalization: No - Comment as needed  Activities of Daily Living Home Assistive Devices/Equipment: Walker (specify type) ADL Screening (condition at time of admission) Patient's cognitive ability adequate to safely complete daily activities?: No Is the patient deaf or have difficulty hearing?: No Does the patient have difficulty seeing, even when wearing glasses/contacts?: Yes Does the patient have difficulty concentrating, remembering, or making decisions?: No Patient able to express need for assistance with ADLs?: Yes Does the patient have difficulty dressing or bathing?: Yes Independently performs ADLs?: No Communication: Independent Dressing (OT): Needs assistance Is this a change from baseline?: Pre-admission baseline Grooming: Needs assistance Is this a change from baseline?: Pre-admission baseline Feeding: Dependent Is this a change from baseline?: Pre-admission baseline Bathing: Needs assistance Is this a change from baseline?: Pre-admission baseline Toileting: Needs assistance Is this a change from baseline?: Pre-admission baseline In/Out Bed: Needs assistance Is this a change from baseline?: Pre-admission baseline Walks in Home: Needs assistance Is this a change from baseline?: Pre-admission baseline Does the patient have difficulty walking or climbing stairs?: Yes Weakness of Legs: Both Weakness of Arms/Hands: Both  Permission Sought/Granted  Emotional Assessment Appearance:: Appears stated age Attitude/Demeanor/Rapport: Engaged, Gracious Affect (typically observed): Accepting, Appropriate, Calm, Pleasant Orientation: : Oriented to Self, Oriented to Place, Oriented to  Situation Alcohol / Substance Use: Not Applicable Psych Involvement: No (comment)  Admission diagnosis:  Small bowel obstruction ([place]) [K56.609] Volvulus of intestine (HCC) [K56.2] Sigmoid volvulus ([place]) [K56.2] Patient Active Problem List   Diagnosis Date Noted   Volvulus of intestine ([place]) 06/22/2022   Hypokalemia 06/22/2022   Aspiration pneumonia ([place]) 09/14/2021   COVID 08/04/2020   Demand ischemia 08/04/2020   SOB (shortness of breath) 08/04/2020   COPD exacerbation ([place]) 11/08/2019   Acute exacerbation of chronic obstructive pulmonary disease (COPD) ([place]) 10/18/2019   Hypertensive urgency 10/17/2019   COPD with acute exacerbation ([place]) 09/12/2019   Acute on chronic respiratory failure with hypoxia and hypercapnia ([place]) 09/12/2019   Tobacco abuse 09/12/2019   PAD (peripheral artery disease) ([place]) 09/12/2019   HLD (hyperlipidemia) 09/12/2019   Atherosclerosis of native arteries of the extremities with ulceration ([place]) 06/28/2019   Sepsis ([place]) 12/24/2017   Protein-calorie malnutrition, severe 11/12/2017   Acute respiratory failure ([place]) 11/10/2017   Acute respiratory distress 12/22/2015   COPD (chronic obstructive pulmonary disease) ([place]) 12/22/2015   Acute bronchitis 12/22/2015   Hypertension 12/22/2015   Lung cancer, lingula ([place]) 09/23/2015   Chronic edema 09/15/2014   PCP:  Kirk Ruths, MD Pharmacy:   [place], [place] - [address] [place] [address] Phone: [phone] Fax: [phone]     Social Determinants of Health (SDOH) Interventions    Readmission Risk Interventions    06/24/2022   11:08 AM  Readmission Risk Prevention Plan  Transportation Screening Complete  PCP or Specialist Appt within 3-5 Days Complete  Social Work Consult for [place] Planning/Counseling Complete  Palliative Care Screening Not Applicable  Medication Review Press [employer]) Complete

## 2022-06-24 NOTE — Progress Notes (Signed)
Subjective:  CC: Carl Bell is a 71 y.o. male  Hospital stay day 2, 1 Day Post-Op Hartman's procedure for sigmoid volvulus  HPI: No complaints overnight.    ROS:  General: Denies weight loss, weight gain, fatigue, fevers, chills, and night sweats. Heart: Denies chest pain, palpitations, racing heart, irregular heartbeat, leg pain or swelling, and decreased activity tolerance. Respiratory: Denies breathing difficulty, shortness of breath, wheezing, cough, and sputum. GI: Denies change in appetite, heartburn, nausea, vomiting, constipation, diarrhea, and blood in stool. GU: Denies difficulty urinating, pain with urinating, urgency, frequency, blood in urine.   Objective:   Temp:  [93.9 F (34.4 C)-98.5 F (36.9 C)] 97.7 F (36.5 C) (12/12 0745) Pulse Rate:  [78-93] 93 (12/12 1135) Resp:  [14-20] 20 (12/12 1135) BP: (94-127)/(65-91) 127/73 (12/12 1135) SpO2:  [98 %-100 %] 100 % (12/12 1135)     Height: 6\' 1"  (185.4 cm) Weight: 53.1 kg BMI (Calculated): 15.44   Intake/Output this shift:   Intake/Output Summary (Last 24 hours) at 06/24/2022 1506 Last data filed at 06/24/2022 1031 Gross per 24 hour  Intake 35.82 ml  Output 375 ml  Net -339.18 ml    Constitutional :  alert, cooperative, appears stated age, and no distress  Respiratory:  clear to auscultation bilaterally  Cardiovascular:  regular rate and rhythm  Gastrointestinal: Soft, no guarding, ostomy with productive stool.   Skin: Cool and moist.  Staple line clean dry and intact  Psychiatric: Normal affect, non-agitated, not confused       LABS:     Latest Ref Rng & Units 06/24/2022    9:52 AM 06/24/2022    6:32 AM 06/23/2022    5:22 AM  CMP  Glucose 70 - 99 mg/dL 133  75  74   BUN 8 - 23 mg/dL 21  22  21    Creatinine 0.61 - 1.24 mg/dL 1.13  1.05  0.76   Sodium 135 - 145 mmol/L 143  145  139   Potassium 3.5 - 5.1 mmol/L 5.1  7.1  4.0   Chloride 98 - 111 mmol/L 111  115  104   CO2 22 - 32 mmol/L 21  19  21     Calcium 8.9 - 10.3 mg/dL 8.3  8.0  8.3       Latest Ref Rng & Units 06/24/2022    6:32 AM 06/23/2022    5:22 AM 06/22/2022    5:00 PM  CBC  WBC 4.0 - 10.5 K/uL 8.5  6.1  5.8   Hemoglobin 13.0 - 17.0 g/dL 9.2  11.6  9.8   Hematocrit 39.0 - 52.0 % 29.3  36.3  30.9   Platelets 150 - 400 K/uL 254  292  274     RADS: N/a Assessment:   Sigmoid volvulus, s/p decompression colonoscopy, subsequent Hartman's procedure  Stable.  Tolerated NG clamp trial and started on clear liquid diet after noting productive stool in ostomy.  Continue to monitor and appreciate hospitalist recs for management of comorbidities.  The patient verbalized understanding and all questions were answered to the patient's satisfaction.  labs/images/medications/previous chart entries reviewed personally and relevant changes/updates noted above.

## 2022-06-24 NOTE — Hospital Course (Addendum)
Taken from prior notes.  Carl Bell is a 71 y.o. male with past medical history significant for COPD with chronic respiratory failure on 2 L of oxygen, hypertension, history of lung cancer status post chemoradiation therapy, history of liver cirrhosis and hypertension who presents to the ER for evaluation of a 3-day history of constipation associated with diffuse abdominal pain.  CT scan of abdomen and pelvis which showed high-grade distal colonic obstruction due to sigmoid volvulus. Large right groin hernia containing cecum, ileum, and ascites. Chronic calcific pancreatitis.   Patient has been admitted to the surgical service and medical consult requested to manage patient's multiple comorbidities. S/p sigmoid colectomy on 06/23/2022.  Patient developed acute renal failure secondary to urinary retention 12/27.  Foley catheter is anchored, received fluids, renal function appears to be improving.  Patient also had a paracentesis removing 2.1 L of ascites.  Patient seems very high risk for deterioration and mortality based on underlying comorbidities and poor functional status.  PT/OT are recommending SNF.

## 2022-06-24 NOTE — Assessment & Plan Note (Signed)
Potassium at 7.1, apparently he was on potassium infusion which was immediately discontinued.  No EKG changes. -Calcium gluconate, albuterol and insulin with D50. -Repeat potassium at 5.1 -Continue to monitor

## 2022-06-24 NOTE — Assessment & Plan Note (Signed)
Hemoglobin decreased to 9.2 postsurgically.  Anemia panel with anemia of chronic disease with some iron deficiency. -Ordered 2 doses of IV iron -Monitor hemoglobin -Transfuse if below 7

## 2022-06-25 DIAGNOSIS — K562 Volvulus: Secondary | ICD-10-CM | POA: Diagnosis not present

## 2022-06-25 LAB — CBC
HCT: 30.8 % — ABNORMAL LOW (ref 39.0–52.0)
Hemoglobin: 9.9 g/dL — ABNORMAL LOW (ref 13.0–17.0)
MCH: 24.3 pg — ABNORMAL LOW (ref 26.0–34.0)
MCHC: 32.1 g/dL (ref 30.0–36.0)
MCV: 75.5 fL — ABNORMAL LOW (ref 80.0–100.0)
Platelets: 223 10*3/uL (ref 150–400)
RBC: 4.08 MIL/uL — ABNORMAL LOW (ref 4.22–5.81)
RDW: 16.5 % — ABNORMAL HIGH (ref 11.5–15.5)
WBC: 6.1 10*3/uL (ref 4.0–10.5)
nRBC: 0 % (ref 0.0–0.2)

## 2022-06-25 LAB — BASIC METABOLIC PANEL
Anion gap: 2 — ABNORMAL LOW (ref 5–15)
BUN: 20 mg/dL (ref 8–23)
CO2: 28 mmol/L (ref 22–32)
Calcium: 8.4 mg/dL — ABNORMAL LOW (ref 8.9–10.3)
Chloride: 111 mmol/L (ref 98–111)
Creatinine, Ser: 0.81 mg/dL (ref 0.61–1.24)
GFR, Estimated: >60 mL/min (ref >60)
Glucose, Bld: 115 mg/dL — ABNORMAL HIGH (ref 70–99)
Potassium: 4.5 mmol/L (ref 3.5–5.1)
Sodium: 141 mmol/L (ref 135–145)

## 2022-06-25 LAB — MAGNESIUM: Magnesium: 2.2 mg/dL (ref 1.7–2.4)

## 2022-06-25 LAB — SURGICAL PATHOLOGY

## 2022-06-25 MED ORDER — LACTATED RINGERS IV SOLN: INTRAVENOUS | Status: DC

## 2022-06-25 NOTE — Progress Notes (Signed)
Subjective:  CC: Carl Bell is a 71 y.o. male  Hospital stay day 3, 2 Days Post-Op Hartman's procedure for sigmoid volvulus  HPI: No complaints overnight.    ROS:  General: Denies weight loss, weight gain, fatigue, fevers, chills, and night sweats. Heart: Denies chest pain, palpitations, racing heart, irregular heartbeat, leg pain or swelling, and decreased activity tolerance. Respiratory: Denies breathing difficulty, shortness of breath, wheezing, cough, and sputum. GI: Denies change in appetite, heartburn, nausea, vomiting, constipation, diarrhea, and blood in stool. GU: Denies difficulty urinating, pain with urinating, urgency, frequency, blood in urine.   Objective:   Temp:  [97.4 F (36.3 C)-98 F (36.7 C)] 97.5 F (36.4 C) (12/13 1522) Pulse Rate:  [52-96] 80 (12/13 1624) Resp:  [18-21] 21 (12/13 1522) BP: (93-140)/(68-87) 106/79 (12/13 1624) SpO2:  [90 %-96 %] 90 % (12/13 1522)     Height: 6\' 1"  (185.4 cm) Weight: 53.1 kg BMI (Calculated): 15.44   Intake/Output this shift:   Intake/Output Summary (Last 24 hours) at 06/25/2022 1737 Last data filed at 06/25/2022 1539 Gross per 24 hour  Intake 167.2 ml  Output 1550 ml  Net -1382.8 ml    Constitutional :  alert, cooperative, appears stated age, and no distress  Respiratory:  clear to auscultation bilaterally  Cardiovascular:  regular rate and rhythm  Gastrointestinal: Soft, no guarding, ostomy with productive stool.   Skin: Cool and moist.  Staple line clean dry and intact  Psychiatric: Normal affect, non-agitated, not confused       LABS:     Latest Ref Rng & Units 06/25/2022    4:51 AM 06/24/2022    9:52 AM 06/24/2022    6:32 AM  CMP  Glucose 70 - 99 mg/dL 115  133  75   BUN 8 - 23 mg/dL 20  21  22    Creatinine 0.61 - 1.24 mg/dL 0.81  1.13  1.05   Sodium 135 - 145 mmol/L 141  143  145   Potassium 3.5 - 5.1 mmol/L 4.5  5.1  7.1   Chloride 98 - 111 mmol/L 111  111  115   CO2 22 - 32 mmol/L 28  21  19     Calcium 8.9 - 10.3 mg/dL 8.4  8.3  8.0       Latest Ref Rng & Units 06/25/2022    4:51 AM 06/24/2022    6:32 AM 06/23/2022    5:22 AM  CBC  WBC 4.0 - 10.5 K/uL 6.1  8.5  6.1   Hemoglobin 13.0 - 17.0 g/dL 9.9  9.2  11.6   Hematocrit 39.0 - 52.0 % 30.8  29.3  36.3   Platelets 150 - 400 K/uL 223  254  292     RADS: N/a Assessment:   Sigmoid volvulus, s/p decompression colonoscopy, subsequent Hartman's procedure  Stable.  Tolerated clears and BM noted overnight but nothing during day.  Will make sure another BM before advancing clears.  labs/images/medications/previous chart entries reviewed personally and relevant changes/updates noted above.

## 2022-06-25 NOTE — Consult Note (Signed)
WOC attempted to see this patient today. He is standing with PT, purewick has been pulled by patient and is leaking. Patient is reported to be very low vision and mother (72yo) cares for him at home. Reported to be SNF appropriate but was adamant to go home last admission. Ostomy care will require support and possibly full support for care at home.  I will contact mother, TOC and MD to determine plans.  He did not have any output when I visited.  I will plan to re-assess patient Friday am. No WOC on the [facility] tomorrow.   [place], [place], [place]

## 2022-06-25 NOTE — Progress Notes (Signed)
Consultation Progress Note   Patient: Carl Bell ZJI:[id number] DOB: 03/09/1951 DOA: 06/22/2022 DOS: the patient was seen and examined on 06/25/2022 Primary service: [name], DO  Brief hospital course: Taken from prior notes.  Carl Bell is a 71 y.o. male with past medical history significant for COPD with chronic respiratory failure on 2 L of oxygen, hypertension, history of lung cancer status post chemoradiation therapy, history of liver cirrhosis and hypertension who presents to the ER for evaluation of a 3-day history of constipation associated with diffuse abdominal pain with localization to the periumbilical area rated an 8 x 10 in intensity at its worst.  There is no radiation of the pain but it is associated with nausea and vomiting.  He admits to a 55-month unintentional 50 pound weight loss.  He denies having any alleviating or aggravating factors.   CT scan of abdomen and pelvis which showed high-grade distal colonic obstruction due to sigmoid volvulus. Large right groin hernia containing cecum, ileum, and ascites. Chronic calcific pancreatitis.  14 mm presumed pseudocyst. Bilateral complicated renal cysts that are size stable from a PET CT in 2017. Advanced atherosclerosis.   Patient has been admitted to the surgical service and medical consult requested to manage patient's multiple comorbidities.   S/p sigmoid colectomy on 06/23/2022.  12/12: Hemodynamically stable but labs pertinent for potassium of 7.1-ordered EKG, calcium gluconate, albuterol and insulin per hyperkalemia order set protocol.  Patient is n.p.o. so cannot use Lokelma or Kayexalate.  Apparently patient was on IV infusion with potassium which was immediately discontinued. Magnesium was also low at 1.6-replating.  CBC with decrease of hemoglobin to 9.2 postoperatively, checking anemia panel, which shows anemia of chronic disease with some iron deficiency. Ordered IV iron as patient is n.p.o. Patient was  started on clear liquid diet after having stool in colostomy bag.  12/13: Hemodynamically stable, hemoglobin started improving to 9.9, hyperkalemia resolved with potassium of 4.5 and magnesium of 2.2 today.  Overnight leakage from wound which is being managed by surgical team.  Might need to repeat imaging.   Assessment and Plan: * Volvulus of intestine (HCC) S/p sigmoid colectomy.  Having bowel movement in colostomy bag. -Being started on clear liquid diet by general surgery. -Management by general surgery  Acute postoperative anemia due to expected blood loss Hemoglobin decreased to 9.2>>9.9 postsurgically.  Anemia panel with anemia of chronic disease with some iron deficiency. -Ordered 2 doses of IV iron-received second dose today -Monitor hemoglobin -Transfuse if below 7  Hyperkalemia Resolved, potassium at 4.5 Potassium at 7.1, apparently he was on potassium infusion which was immediately discontinued.  No EKG changes. -Received calcium gluconate, albuterol and insulin with D50. -Continue to monitor  Hypertension Currently blood pressure within goal. -IV hydralazine as needed  COPD (chronic obstructive pulmonary disease) ([place]) Patient has a history of COPD with chronic respiratory failure on 2 L of oxygen continuous. Stable and not acutely exacerbated Continue as needed bronchodilator therapy as well as inhaled steroids Continue oxygen supplementation to maintain pulse oximetry greater than 92%   Hypokalemia Resolved.  Now had hyperkalemia. -Please see above   DVT prophylaxis.  Lovenox  TRH will continue to follow the patient.  Subjective: Patient was seen and examined today, tolerating clear liquid diet.  Physical Exam: Vitals:   06/25/22 0552 06/25/22 0821 06/25/22 0825 06/25/22 1522  BP: 118/73 107/68 107/68 93/68  Pulse: 96 80 78 (!) 52  Resp: 18 20 19  (!) 21  Temp: (!) 97.4 F (36.3 C)  98 F (36.7 C) (!) 97.5 F (36.4 C) (!) 97.5 F (36.4 C)  TempSrc:    Oral   SpO2: 94%  94% 90%  Weight:      Height:       General.  Emaciated gentleman, in no acute distress. Pulmonary.  Lungs clear bilaterally, normal respiratory effort. CV.  Regular rate and rhythm, no JVD, rub or murmur. Abdomen.  Soft, nontender, nondistended, BS positive.  Colostomy bag in place. CNS.  Alert and oriented .  No focal neurologic deficit. Extremities.  No edema, no cyanosis, pulses intact and symmetrical. Psychiatry.  Appears to have some cognitive impairment  Data Reviewed: Prior data reviewed  Family Communication: Being done by primary team  Time spent: 45 minutes.  This record has been created using Systems analyst. Errors have been sought and corrected,but may not always be located. Such creation errors do not reflect on the standard of care.   Author: Lorella Nimrod, MD 06/25/2022 4:11 PM  For on call review [email or web address].

## 2022-06-25 NOTE — Progress Notes (Deleted)
Patient wound to abd with constant drainage. Several attempts made to patch dressing such as incision made to dressing and suctioned green bowel, patch work to wound vac applied. Wound vac leak several times after. Patient still requiring pain medication every two hours.

## 2022-06-25 NOTE — Evaluation (Signed)
Occupational Therapy Evaluation Patient Details Name: Carl Bell MRN: [id number] DOB: 1950/10/12 Today's Date: 06/25/2022   History of Present Illness Carl Bell is a 71 y.o. male with past medical history significant for low vision, COPD with chronic respiratory failure on 2 L of oxygen, hypertension, history of lung cancer status post chemoradiation therapy, history of liver cirrhosis and hypertension who presents to the ER for evaluation of a 3-day history of constipation associated with diffuse abdominal pain. S/p Hartman's procedure for sigmoid volvulus on 06/23/22.   Clinical Impression   Carl Bell was seen for OT evaluation this date. Prior to hospital admission, pt was IND for mobility and assist for IADLs. Pt lives with 66yo mother. Pt presents to acute OT demonstrating impaired ADL performance and functional mobility 2/2 decreased activity tolerance and functional strength/balance deficits. A&O x2, repeatedly asks where he is and if he can smoke. Question cognition vs visual deficits limiting pt this session.   Pt currently requires MIN A don/doff socks seated EOB. MIN A + RW sit<>stand and ~20 ft mobility. In standing reports need to urinate, leakage from purewick requires sock change and LB washing with MIN A. Pt would benefit from skilled OT to address noted impairments and functional limitations (see below for any additional details). Upon hospital discharge, recommend STR however if family able to manage ostomy changes and pt's cognition improves, may improve to [place].    Recommendations for follow up therapy are one component of a multi-disciplinary discharge planning process, led by the attending physician.  Recommendations may be updated based on patient status, additional functional criteria and insurance authorization.   Follow Up Recommendations  Skilled nursing-short term rehab (<3 hours/day) (may progress)     Assistance Recommended at Discharge Frequent or constant  Supervision/Assistance  Patient can return home with the following A little help with walking and/or transfers;A little help with bathing/dressing/bathroom;Help with stairs or ramp for entrance    Functional Status Assessment  Patient has had a recent decline in their functional status and demonstrates the ability to make significant improvements in function in a reasonable and predictable amount of time.  Equipment Recommendations  BSC/3in1    Recommendations for Other Services       Precautions / Restrictions Precautions Precautions: Fall Restrictions Weight Bearing Restrictions: No      Mobility Bed Mobility Overal bed mobility: Needs Assistance Bed Mobility: Sit to Supine, Supine to Sit     Supine to sit: Min assist Sit to supine: Min guard        Transfers Overall transfer level: Needs assistance Equipment used: Rolling walker (2 wheels) Transfers: Sit to/from Stand Sit to Stand: Min assist                  Balance Overall balance assessment: Needs assistance Sitting-balance support: No upper extremity supported, Feet supported Sitting balance-Leahy Scale: Good     Standing balance support: Single extremity supported, During functional activity Standing balance-Leahy Scale: Fair                             ADL either performed or assessed with clinical judgement   ADL Overall ADL's : Needs assistance/impaired                                       General ADL Comments: MIN A don/doff socks seated EOB.  MIN A + RW for simulated toilet t/f.      Pertinent Vitals/Pain Pain Assessment Pain Assessment: No/denies pain     Hand Dominance Right   Extremity/Trunk Assessment Upper Extremity Assessment Upper Extremity Assessment: Generalized weakness   Lower Extremity Assessment Lower Extremity Assessment: Generalized weakness       Communication Communication Communication: No difficulties   Cognition  Arousal/Alertness: Awake/alert Behavior During Therapy: WFL for tasks assessed/performed Overall Cognitive Status: No family/caregiver present to determine baseline cognitive functioning Area of Impairment: Orientation, Attention, Following commands, Problem solving                 Orientation Level: Disoriented to, Place, Time Current Attention Level: Focused   Following Commands: Follows one step commands consistently, Follows one step commands with increased time     Problem Solving: Slow processing, Decreased initiation, Difficulty sequencing, Requires verbal cues, Requires tactile cues General Comments: ? cog vs vision deficits.                Home Living Family/patient expects to be discharged to:: Private residence Living Arrangements: Parent Available Help at Discharge: Family Type of Home: House Home Access: Stairs to enter Technical brewer of Steps: 4 Entrance Stairs-Rails: Left;Right Home Layout: One level                   Additional Comments: setup per chart. lives with 50yo mother who has aids to assist her      Prior Functioning/Environment Prior Level of Function : Independent/Modified Independent             Mobility Comments: household ambulator ADLs Comments: limited by low vision. assist for IADLs        OT Problem List: Decreased strength;Decreased activity tolerance;Impaired balance (sitting and/or standing);Decreased safety awareness      OT Treatment/Interventions: Self-care/ADL training;Therapeutic exercise;Energy conservation;DME and/or AE instruction;Therapeutic activities;Patient/family education;Balance training    OT Goals(Current goals can be found in the care plan section) Acute Rehab OT Goals Patient Stated Goal: to go home OT Goal Formulation: With patient Time For Goal Achievement: 07/09/22 Potential to Achieve Goals: Good ADL Goals Pt Will Perform Grooming: with modified independence;standing Pt Will  Perform Lower Body Dressing: Independently;sit to/from stand Pt Will Transfer to Toilet: with modified independence;ambulating;regular height toilet  OT Frequency: Min 4X/week    Co-evaluation              AM-PAC OT "6 Clicks" Daily Activity     Outcome Measure Help from another person eating meals?: None Help from another person taking care of personal grooming?: A Little Help from another person toileting, which includes using toliet, bedpan, or urinal?: A Little Help from another person bathing (including washing, rinsing, drying)?: A Little Help from another person to put on and taking off regular upper body clothing?: None Help from another person to put on and taking off regular lower body clothing?: A Little 6 Click Score: 20   End of Session Nurse Communication: Mobility status  Activity Tolerance: Patient tolerated treatment well Patient left: in bed;with call bell/phone within reach;with bed alarm set  OT Visit Diagnosis: Other abnormalities of gait and mobility (R26.89);Muscle weakness (generalized) (M62.81)                Time: 0102-7253 OT Time Calculation (min): 29 min Charges:  OT General Charges $OT Visit: 1 Visit OT Evaluation $OT Eval Moderate Complexity: 1 Mod OT Treatments $Self Care/Home Management : 8-22 mins  [name], M.S. OTR/L  06/25/22,  1:23 PM  ascom [phone]

## 2022-06-25 NOTE — Evaluation (Signed)
Physical Therapy Evaluation Patient Details Name: Carl Bell MRN: [id number] DOB: [birth year] Today's Date: 06/25/2022  History of Present Illness  Carl Bell is a 71 y.o. male with past medical history significant for low vision, COPD with chronic respiratory failure on 2 L of oxygen, hypertension, history of lung cancer status post chemoradiation therapy, history of liver cirrhosis and hypertension who presents to the ER for evaluation of a 3-day history of constipation associated with diffuse abdominal pain. S/p Hartman's procedure for sigmoid volvulus on 06/23/22.  Clinical Impression  Pt is a pleasant 71 year old male who was admitted for Hartman's procedure. Pt alert and confused upon arrival, very sleepy. Limited secondary to poor vision. Pt performs bed mobility, transfers, and ambulation with min assist and HHA. Limited distance secondary to vision and weakness. Pt demonstrates deficits with strength/mobility/balance. Would attempt ambulation with RW next session. HR increased to 113bpm with exertion and O2 sats at 93% on RA. Would benefit from skilled PT to address above deficits and promote optimal return to PLOF; recommend transition to STR upon discharge from acute hospitalization.      Recommendations for follow up therapy are one component of a multi-disciplinary discharge planning process, led by the attending physician.  Recommendations may be updated based on patient status, additional functional criteria and insurance authorization.  Follow Up Recommendations Skilled nursing-short term rehab (<3 hours/day) Can patient physically be transported by private vehicle: Yes    Assistance Recommended at Discharge Intermittent Supervision/Assistance  Patient can return home with the following  A little help with walking and/or transfers;A little help with bathing/dressing/bathroom;Help with stairs or ramp for entrance    Equipment Recommendations  (TBD)  Recommendations for  Other Services       Functional Status Assessment Patient has had a recent decline in their functional status and demonstrates the ability to make significant improvements in function in a reasonable and predictable amount of time.     Precautions / Restrictions Precautions Precautions: Fall Restrictions Weight Bearing Restrictions: No      Mobility  Bed Mobility Overal bed mobility: Needs Assistance Bed Mobility: Supine to Sit     Supine to sit: Min assist     General bed mobility comments: needs initiation to move B LEs across bed. Once seated, able to sit with upright posture and supervision    Transfers Overall transfer level: Needs assistance Equipment used: 1 person hand held assist Transfers: Sit to/from Stand Sit to Stand: Min assist           General transfer comment: needs cues for sequencing secondary to low vision. Additional person required due to equipment.    Ambulation/Gait Ambulation/Gait assistance: Min assist Gait Distance (Feet): 15 Feet Assistive device: 1 person hand held assist Gait Pattern/deviations: Step-to pattern       General Gait Details: ambulated around bed with HHA and additional person for equipment. Pt limited due to low vision. Does feel very weak and min assist secondary to B knees buckling.  Stairs            Wheelchair Mobility    Modified Rankin (Stroke Patients Only)       Balance Overall balance assessment: Needs assistance Sitting-balance support: No upper extremity supported, Feet supported Sitting balance-Leahy Scale: Good     Standing balance support: Single extremity supported, During functional activity Standing balance-Leahy Scale: Fair  Pertinent Vitals/Pain Pain Assessment Pain Assessment: No/denies pain    Home Living Family/patient expects to be discharged to:: Private residence Living Arrangements: Parent (mom) Available Help at Discharge:  Family Type of Home: House Home Access: Stairs to enter Entrance Stairs-Rails: Chemical engineer of Steps: 4   Home Layout: One level   Additional Comments: setup per chart. lives with 29yo mother who has aids to assist her    Prior Function Prior Level of Function : Independent/Modified Independent             Mobility Comments: household ambulator ADLs Comments: limited by low vision. assist for IADLs     Hand Dominance        Extremity/Trunk Assessment   Upper Extremity Assessment Upper Extremity Assessment: Generalized weakness    Lower Extremity Assessment Lower Extremity Assessment: Generalized weakness       Communication   Communication: No difficulties  Cognition Arousal/Alertness: Awake/alert Behavior During Therapy: WFL for tasks assessed/performed                                   General Comments: confused to place, time, situation        General Comments      Exercises     Assessment/Plan    PT Assessment Patient needs continued PT services  PT Problem List Decreased strength;Decreased activity tolerance;Decreased balance;Decreased mobility       PT Treatment Interventions DME instruction;Gait training;Therapeutic exercise    PT Goals (Current goals can be found in the Care Plan section)  Acute Rehab PT Goals Patient Stated Goal: unable PT Goal Formulation: With patient Time For Goal Achievement: 07/09/22 Potential to Achieve Goals: Good    Frequency Min 4X/week     Co-evaluation               AM-PAC PT "6 Clicks" Mobility  Outcome Measure Help needed turning from your back to your side while in a flat bed without using bedrails?: A Little Help needed moving from lying on your back to sitting on the side of a flat bed without using bedrails?: A Little Help needed moving to and from a bed to a chair (including a wheelchair)?: A Little Help needed standing up from a chair using your arms  (e.g., wheelchair or bedside chair)?: A Little Help needed to walk in hospital room?: A Lot Help needed climbing 3-5 steps with a railing? : Total 6 Click Score: 15    End of Session Equipment Utilized During Treatment: Gait belt Activity Tolerance: Patient tolerated treatment well Patient left: in chair;with chair alarm set Nurse Communication: Mobility status PT Visit Diagnosis: Unsteadiness on feet (R26.81);Muscle weakness (generalized) (M62.81);Difficulty in walking, not elsewhere classified (R26.2)    Time: 3338-3291 PT Time Calculation (min) (ACUTE ONLY): 26 min   Charges:   PT Evaluation $PT Eval Low Complexity: 1 Low PT Treatments $Gait Training: 8-22 mins        [name], PT, DPT, GCS [phone]   Tsutomu Barfoot 06/25/2022, 4:33 PM

## 2022-06-25 NOTE — Plan of Care (Signed)
Bed in lowest position, bed alarm activated, patient reoriented to uni.t

## 2022-06-25 NOTE — Assessment & Plan Note (Addendum)
Hemoglobin decreased to 9.2>>9.9>>10.4 postsurgically.  Anemia panel with anemia of chronic disease with some iron deficiency. -Received 2 doses of IV iron- -Monitor hemoglobin -Transfuse if below 7

## 2022-06-25 NOTE — Care Management Important Message (Signed)
Important Message  Patient Details  Name: IHSAN NOMURA MRN: [id number] Date of Birth: [birth year]   Medicare Important Message Given:  N/A - LOS <3 / Initial given by admissions     Dannette Barbara 06/25/2022, 9:42 AM

## 2022-06-25 NOTE — Assessment & Plan Note (Signed)
Resolved, potassium at 4.5 Potassium at 7.1, apparently he was on potassium infusion which was immediately discontinued.  No EKG changes. -Received calcium gluconate, albuterol and insulin with D50. -Continue to monitor

## 2022-06-26 DIAGNOSIS — K562 Volvulus: Secondary | ICD-10-CM | POA: Diagnosis not present

## 2022-06-26 LAB — CBC
HCT: 33.2 % — ABNORMAL LOW (ref 39.0–52.0)
Hemoglobin: 10.4 g/dL — ABNORMAL LOW (ref 13.0–17.0)
MCH: 23.7 pg — ABNORMAL LOW (ref 26.0–34.0)
MCHC: 31.3 g/dL (ref 30.0–36.0)
MCV: 75.8 fL — ABNORMAL LOW (ref 80.0–100.0)
Platelets: 232 10*3/uL (ref 150–400)
RBC: 4.38 MIL/uL (ref 4.22–5.81)
RDW: 16.7 % — ABNORMAL HIGH (ref 11.5–15.5)
WBC: 4.8 10*3/uL (ref 4.0–10.5)
nRBC: 0 % (ref 0.0–0.2)

## 2022-06-26 LAB — BASIC METABOLIC PANEL
Anion gap: 5 (ref 5–15)
BUN: 17 mg/dL (ref 8–23)
CO2: 26 mmol/L (ref 22–32)
Calcium: 8.4 mg/dL — ABNORMAL LOW (ref 8.9–10.3)
Chloride: 107 mmol/L (ref 98–111)
Creatinine, Ser: 0.66 mg/dL (ref 0.61–1.24)
GFR, Estimated: >60 mL/min (ref >60)
Glucose, Bld: 90 mg/dL (ref 70–99)
Potassium: 4 mmol/L (ref 3.5–5.1)
Sodium: 138 mmol/L (ref 135–145)

## 2022-06-26 LAB — MAGNESIUM: Magnesium: 1.9 mg/dL (ref 1.7–2.4)

## 2022-06-26 LAB — GLUCOSE, CAPILLARY
Glucose-Capillary: 76 mg/dL (ref 70–99)
Glucose-Capillary: 56 mg/dL — ABNORMAL LOW (ref 70–99)
Glucose-Capillary: 73 mg/dL (ref 70–99)

## 2022-06-26 LAB — PHOSPHORUS: Phosphorus: 2.6 mg/dL (ref 2.5–4.6)

## 2022-06-26 MED ORDER — LORAZEPAM 2 MG/ML IJ SOLN
0.5000 mg | Freq: Once | INTRAMUSCULAR | Status: AC
  Administered 2022-06-26: 0.5 mg via INTRAVENOUS
  Filled 2022-06-26: qty 1

## 2022-06-26 MED ORDER — DEXTROSE 10 % IV SOLN
INTRAVENOUS | Status: DC
  Administered 2022-06-28: 950 mL via INTRAVENOUS

## 2022-06-26 MED ORDER — DOCUSATE SODIUM 100 MG PO CAPS
100.0000 mg | ORAL_CAPSULE | Freq: Every day | ORAL | Status: DC | PRN
  Administered 2022-06-28 – 2022-07-12 (×2): 100 mg via ORAL
  Filled 2022-06-26 (×2): qty 1

## 2022-06-26 MED ORDER — DEXTROSE 50 % IV SOLN
12.5000 g | Freq: Once | INTRAVENOUS | Status: AC
  Administered 2022-06-26: 12.5 g via INTRAVENOUS
  Filled 2022-06-26: qty 50

## 2022-06-26 MED ORDER — ENSURE ENLIVE PO LIQD
237.0000 mL | Freq: Three times a day (TID) | ORAL | Status: DC
  Administered 2022-06-27 – 2022-07-18 (×34): 237 mL via ORAL

## 2022-06-26 MED ORDER — ADULT MULTIVITAMIN W/MINERALS CH
1.0000 | ORAL_TABLET | Freq: Every day | ORAL | Status: DC
  Administered 2022-06-27 – 2022-07-18 (×21): 1 via ORAL
  Filled 2022-06-26 (×21): qty 1

## 2022-06-26 NOTE — Progress Notes (Addendum)
Initial Nutrition Assessment  DOCUMENTATION CODES:   Severe malnutrition in context of chronic illness  INTERVENTION:   Ensure Enlive po TID, each supplement provides 350 kcal and 20 grams of protein.  Magic cup TID with meals, each supplement provides 290 kcal and 9 grams of protein  MVI po daily   Pt at high refeed risk; recommend monitor potassium, magnesium and phosphorus labs daily until stable  Daily weights   NUTRITION DIAGNOSIS:   Severe Malnutrition related to chronic illness as evidenced by severe fat depletion, severe muscle depletion, 27 percent weight loss in 9 months.  GOAL:   Patient will meet greater than or equal to 90% of their needs  MONITOR:   PO intake, Supplement acceptance, Labs, Weight trends, Skin, I & O's  REASON FOR ASSESSMENT:   Other (Comment) (Low BMI)    ASSESSMENT:   71 y.o. 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  Met with pt in room today. Pt is well known to this RD from previous admissions. Pt with chronic poor appetite and oral intake at baseline. Per chart, pt is down 43lbs(27%) since March; this is severe weight loss. Per NT report, pt ate only bites of his meals today. Pt reports eating a few bites of yogurt and drinking a few sips of broth. RD discussed with pt the importance of adequate nutrition needed to preserve lean muscle and to support post op healing. Pt would like to have strawberry Ensure in hospital. RD will add supplements and MVI to help pt meet his estimated needs. Pt is at high refeed risk. Plan is for SNF at discharge. Pt is followed by hospice.   Medications reviewed and include: lovenox, MVI  Labs reviewed: K 4.0 wnl, P 2.6 wnl, Mg 1.9 wnl Hgb 10.4(L), Hct 33.2(L), MCV 75.8(L), MCH 23.7(L)  NUTRITION -  FOCUSED PHYSICAL EXAM:  Flowsheet Row Most Recent Value  Orbital Region Severe depletion  Upper Arm Region Severe depletion  Thoracic and Lumbar Region Severe depletion  Buccal Region Severe depletion  Temple Region Severe depletion  Clavicle Bone Region Severe depletion  Clavicle and Acromion Bone Region Severe depletion  Scapular Bone Region Severe depletion  Dorsal Hand Severe depletion  Patellar Region Severe depletion  Anterior Thigh Region Severe depletion  Posterior Calf Region Severe depletion  Edema (RD Assessment) None  Hair Reviewed  Eyes Reviewed  Mouth Reviewed  Skin Reviewed  Nails Reviewed   Diet Order:   Diet Order             Diet regular Room service appropriate? Yes; Fluid consistency: Thin  Diet effective now                  EDUCATION NEEDS:   Education needs have been addressed  Skin:  Skin Assessment: Reviewed RN Assessment (incision abdomen)  Last BM:  ostomy  Height:   Ht Readings from Last 1 Encounters:  06/23/22 _0  (1.854 m)    Weight:   Wt Readings from Last 1 Encounters:  06/23/22 53.1 kg    Ideal Body Weight:  83.6 kg  BMI:  Body mass index is 15.44 kg/m.  Estimated Nutritional Needs:   Kcal:  1700-2000kcal/day  Protein:  85-100g/day  Fluid:  1.4-1.6L/day  Koleen Distance MS, RD, LDN Please refer to [facility] for RD and/or RD on-call/weekend/after hours pager

## 2022-06-26 NOTE — Progress Notes (Signed)
Subjective:  CC: Carl Bell is a 71 y.o. male  Hospital stay day 4, 3 Days Post-Op Hartman's procedure for sigmoid volvulus  HPI: No complaints overnight.    ROS:  General: Denies weight loss, weight gain, fatigue, fevers, chills, and night sweats. Heart: Denies chest pain, palpitations, racing heart, irregular heartbeat, leg pain or swelling, and decreased activity tolerance. Respiratory: Denies breathing difficulty, shortness of breath, wheezing, cough, and sputum. GI: Denies change in appetite, heartburn, nausea, vomiting, constipation, diarrhea, and blood in stool. GU: Denies difficulty urinating, pain with urinating, urgency, frequency, blood in urine.   Objective:   Temp:  [97 F (36.1 C)-98.3 F (36.8 C)] 98.3 F (36.8 C) (12/14 1600) Pulse Rate:  [70-102] 102 (12/14 1600) Resp:  [18-20] 18 (12/14 1600) BP: (106-147)/(77-95) 147/93 (12/14 1600) SpO2:  [96 %-100 %] 100 % (12/14 1600)     Height: 6\' 1"  (185.4 cm) Weight: 53.1 kg BMI (Calculated): 15.44   Intake/Output this shift:   Intake/Output Summary (Last 24 hours) at 06/26/2022 1619 Last data filed at 06/26/2022 0600 Gross per 24 hour  Intake 809.63 ml  Output 0 ml  Net 809.63 ml    Constitutional :  alert, cooperative, appears stated age, and no distress  Respiratory:  clear to auscultation bilaterally  Cardiovascular:  regular rate and rhythm  Gastrointestinal: Soft, no guarding, ostomy intact  Skin: Cool and moist.  Staple line clean dry and intact  Psychiatric: Normal affect, non-agitated, not confused       LABS:     Latest Ref Rng & Units 06/26/2022   11:46 AM 06/25/2022    4:51 AM 06/24/2022    9:52 AM  CMP  Glucose 70 - 99 mg/dL 90  115  133   BUN 8 - 23 mg/dL 17  20  21    Creatinine 0.61 - 1.24 mg/dL 0.66  0.81  1.13   Sodium 135 - 145 mmol/L 138  141  143   Potassium 3.5 - 5.1 mmol/L 4.0  4.5  5.1   Chloride 98 - 111 mmol/L 107  111  111   CO2 22 - 32 mmol/L 26  28  21    Calcium 8.9 -  10.3 mg/dL 8.4  8.4  8.3       Latest Ref Rng & Units 06/26/2022   11:46 AM 06/25/2022    4:51 AM 06/24/2022    6:32 AM  CBC  WBC 4.0 - 10.5 K/uL 4.8  6.1  8.5   Hemoglobin 13.0 - 17.0 g/dL 10.4  9.9  9.2   Hematocrit 39.0 - 52.0 % 33.2  30.8  29.3   Platelets 150 - 400 K/uL 232  223  254     RADS: N/a Assessment:   Sigmoid volvulus, s/p decompression colonoscopy, subsequent Hartman's procedure  Stable.  Abdomen remains soft with no distention.  Large amount of stool output noted past couple days.  Will advance to regular diet.  Colace as needed.  Final dispo will be SNF.  Pending bed placement.   labs/images/medications/previous chart entries reviewed personally and relevant changes/updates noted above.

## 2022-06-26 NOTE — TOC Progression Note (Signed)
Transition of Care [facility]) - Progression Note    Patient Details  Name: CHAUN UEMURA MRN: [id number] Date of Birth: 09-08-1950  Transition of Care [facility]) CM/SW [place], LCSW Phone Number: 06/26/2022, 11:25 AM  Clinical Narrative:   Patient only oriented to self. Called mother and discussed SNF recommendation. She is agreeable. Will follow up with bed offers once available.  Expected Discharge Plan: [place] Barriers to Discharge: Continued Medical Work up  Expected Discharge Plan and Services Expected Discharge Plan: [place] Choice:  (TBD) Living arrangements for the past 2 months: Single Family Home                                       Social Determinants of Health (SDOH) Interventions    Readmission Risk Interventions    06/24/2022   11:08 AM  Readmission Risk Prevention Plan  Transportation Screening Complete  PCP or Specialist Appt within 3-5 Days Complete  Social Work Consult for [place] Planning/Counseling Complete  Palliative Care Screening Not Applicable  Medication Review Press photographer) Complete

## 2022-06-26 NOTE — Progress Notes (Signed)
       CROSS COVER NOTE  NAME: MYKAI WENDORF MRN: [id number] DOB : 02-01-1951 ATTENDING PHYSICIAN: [name], DO    Date of Service   06/26/2022   HPI/Events of Note   Notified by nursing that Mr Thea Alken is pulling lines/tubes and is unable to be redirected. Last EKG shows qTC 558.  Interventions   Assessment/Plan:  Ativan 0.5 mg IV  [place]      To reach the provider On-Call:   7AM- 7PM see care teams to locate the attending and reach out to them via [email or web address]. 7PM-7AM contact night-coverage If you still have difficulty reaching the appropriate provider, please page the [facility] (Director on Call) for [facility] on amion for assistance  This document was prepared using Set designer software and may include unintentional dictation errors.  Neomia Glass DNP, MBA, FNP-BC Nurse Practitioner [facility] Pager [phone]

## 2022-06-26 NOTE — NC FL2 (Signed)
[place] LEVEL OF CARE FORM     IDENTIFICATION  Patient Name: Carl Bell Birthdate: 1951-05-29 Sex: male Admission Date (Current Location): 06/22/2022  [facility] and [place] Number:  Engineering geologist and Address:  [facility], [address] [place], [place] [address]      Provider Number: [id number]  Attending Physician Name and Address:  [name], DO  Relative Name and Phone Number:       Current Level of Care: Hospital Recommended Level of Care: [place] Prior Approval Number:    Date Approved/Denied:   PASRR Number: [id number]  Discharge Plan: SNF    Current Diagnoses: Patient Active Problem List   Diagnosis Date Noted   Acute postoperative anemia due to expected blood loss 06/24/2022   Hyperkalemia 06/24/2022   Volvulus of intestine ([place]) 06/22/2022   Hypokalemia 06/22/2022   Aspiration pneumonia ([place]) 09/14/2021   COVID 08/04/2020   Demand ischemia 08/04/2020   SOB (shortness of breath) 08/04/2020   COPD exacerbation ([place]) 11/08/2019   Acute exacerbation of chronic obstructive pulmonary disease (COPD) ([place]) 10/18/2019   Hypertensive urgency 10/17/2019   COPD with acute exacerbation ([place]) 09/12/2019   Acute on chronic respiratory failure with hypoxia and hypercapnia ([place]) 09/12/2019   Tobacco abuse 09/12/2019   PAD (peripheral artery disease) ([place]) 09/12/2019   HLD (hyperlipidemia) 09/12/2019   Atherosclerosis of native arteries of the extremities with ulceration ([place]) 06/28/2019   Sepsis ([place]) 12/24/2017   Protein-calorie malnutrition, severe 11/12/2017   Acute respiratory failure (HCC) 11/10/2017   Acute respiratory distress 12/22/2015   COPD (chronic obstructive pulmonary disease) ([place]) 12/22/2015   Acute bronchitis 12/22/2015   Hypertension 12/22/2015   Lung cancer, lingula ([place]) 09/23/2015   Chronic edema 09/15/2014    Orientation RESPIRATION BLADDER Height & Weight     Self   Normal Incontinent Weight: 117 lb (53.1 kg) Height:  6\' 1"  (185.4 cm)  BEHAVIORAL SYMPTOMS/MOOD NEUROLOGICAL BOWEL NUTRITION STATUS   (None.)  (None) Colostomy (New) Diet (Full liquids)  AMBULATORY STATUS COMMUNICATION OF NEEDS Skin   Limited Assist Verbally Surgical wounds (Incision on medial mid abdomen: [place].)                       Personal Care Assistance Level of Assistance  Bathing, Feeding, Dressing Bathing Assistance: Limited assistance Feeding assistance: Limited assistance Dressing Assistance: Limited assistance     Functional Limitations Info  Sight, Hearing, Speech Sight Info: Adequate Hearing Info: Adequate Speech Info: Adequate    SPECIAL CARE FACTORS FREQUENCY  PT (By licensed PT), OT (By licensed OT)     PT Frequency: 5 x week OT Frequency: 5 x week            Contractures Contractures Info: Not present    Additional Factors Info  Code Status, Allergies Code Status Info: Full code Allergies Info: NKDA           Current Medications (06/26/2022):  This is the current hospital active medication list Current Facility-Administered Medications  Medication Dose Route Frequency Provider Last Rate Last Admin   albuterol (PROVENTIL) (2.5 MG/3ML) 0.083% nebulizer solution 2.5 mg  2.5 mg Inhalation Q6H PRN Sakai, Isami, DO       Chlorhexidine Gluconate Cloth 2 % PADS 6 each  6 each Topical Daily Sakai, Isami, DO   6 each at 06/25/22 1002   dorzolamide-timolol (COSOPT) 2-0.5 % ophthalmic solution 1 drop  1 drop Both Eyes BID Lysle Pearl, Isami, DO  1 drop at 06/25/22 2322   enoxaparin (LOVENOX) injection 40 mg  40 mg Subcutaneous Q24H Sakai, Isami, DO   40 mg at 06/25/22 0800   fluticasone furoate-vilanterol (BREO ELLIPTA) 100-25 MCG/ACT 1 puff  1 puff Inhalation Daily Sakai, Isami, DO   1 puff at 06/25/22 0803   And   umeclidinium bromide (INCRUSE ELLIPTA) 62.5 MCG/ACT 1 puff  1 puff Inhalation Daily Sakai, Isami, DO   1 puff at 06/25/22 0804    hydrALAZINE (APRESOLINE) injection 10 mg  10 mg Intravenous Q6H PRN Sakai, Isami, DO       HYDROcodone-acetaminophen (NORCO/VICODIN) 5-325 MG per tablet 1-2 tablet  1-2 tablet Oral Q4H PRN Sakai, Isami, DO       ipratropium-albuterol (DUONEB) 0.5-2.5 (3) MG/3ML nebulizer solution 3 mL  3 mL Nebulization Q6H PRN Sakai, Isami, DO       latanoprost (XALATAN) 0.005 % ophthalmic solution 1 drop  1 drop Both Eyes QHS Sakai, Isami, DO   1 drop at 06/25/22 2323   morphine (PF) 2 MG/ML injection 2 mg  2 mg Intravenous Q4H PRN Sakai, Isami, DO       ondansetron (ZOFRAN-ODT) disintegrating tablet 4 mg  4 mg Oral Q6H PRN Lysle Pearl, Isami, DO       Or   ondansetron (ZOFRAN) injection 4 mg  4 mg Intravenous Q6H PRN Sakai, Isami, DO       sodium chloride flush (NS) 0.9 % injection 10-40 mL  10-40 mL Intracatheter Q12H Sakai, Isami, DO   10 mL at 06/25/22 2323   sodium chloride flush (NS) 0.9 % injection 10-40 mL  10-40 mL Intracatheter PRN Lysle Pearl, Isami, DO       traMADol (ULTRAM) tablet 50 mg  50 mg Oral Q6H PRN Sakai, Isami, DO   50 mg at 06/25/22 0015     Discharge Medications: Please see discharge summary for a list of discharge medications.  Relevant Imaging Results:  Relevant Lab Results:   Additional Information SS#: [id number]  Candie Chroman, LCSW

## 2022-06-26 NOTE — Progress Notes (Signed)
Physical Therapy Treatment Patient Details Name: Carl Bell MRN: [id number] DOB: [birth year] Today's Date: 06/26/2022   History of Present Illness Carl Bell is a 71 y.o. male with past medical history significant for low vision, COPD with chronic respiratory failure on 2 L of oxygen, hypertension, history of lung cancer status post chemoradiation therapy, history of liver cirrhosis and hypertension who presents to the ER for evaluation of a 3-day history of constipation associated with diffuse abdominal pain. S/p Hartman's procedure for sigmoid volvulus on 06/23/22.    PT Comments    Pt very confused and restless throughout session. Was moving B LEs off bed despite bed rail. Assisted in repositioning for comfort and attempted PT session. Poor participation and unable to perform there-ex. Notified RN of concerns. Will continue to progress. Decreased frequency.   Recommendations for follow up therapy are one component of a multi-disciplinary discharge planning process, led by the attending physician.  Recommendations may be updated based on patient status, additional functional criteria and insurance authorization.  Follow Up Recommendations  Skilled nursing-short term rehab (<3 hours/day) Can patient physically be transported by private vehicle: Yes   Assistance Recommended at Discharge Frequent or constant Supervision/Assistance  Patient can return home with the following A little help with walking and/or transfers;A little help with bathing/dressing/bathroom;Help with stairs or ramp for entrance   Equipment Recommendations   (TBD)    Recommendations for Other Services       Precautions / Restrictions Precautions Precautions: Fall Restrictions Weight Bearing Restrictions: No     Mobility  Bed Mobility Overal bed mobility: Needs Assistance             General bed mobility comments: assisted with repositioning including placing pillows under knees and sliding up. Pt  appears more comfortable and less restless    Transfers                   General transfer comment: not safe to perform OOB mobility at this time    Ambulation/Gait                   Stairs             Wheelchair Mobility    Modified Rankin (Stroke Patients Only)       Balance                                            Cognition Arousal/Alertness: Awake/alert Behavior During Therapy: Restless Overall Cognitive Status: No family/caregiver present to determine baseline cognitive functioning                                 General Comments: confused and restless. Trying to get OOB. Difficult to re-direct        Exercises Other Exercises Other Exercises: attempted ther-ex, however pt having difficulty responding well to cues. Is able to squeeze therapist hands and raises above head, however only spontaneous movement noted on B LEs.    General Comments        Pertinent Vitals/Pain Pain Assessment Pain Assessment: No/denies pain    Home Living                          Prior Function  PT Goals (current goals can now be found in the care plan section) Acute Rehab PT Goals Patient Stated Goal: unable PT Goal Formulation: With patient Time For Goal Achievement: 07/09/22 Potential to Achieve Goals: Fair Progress towards PT goals: Progressing toward goals    Frequency    Min 2X/week      PT Plan Current plan remains appropriate;Frequency needs to be updated    Co-evaluation              AM-PAC PT "6 Clicks" Mobility   Outcome Measure  Help needed turning from your back to your side while in a flat bed without using bedrails?: A Little Help needed moving from lying on your back to sitting on the side of a flat bed without using bedrails?: A Little Help needed moving to and from a bed to a chair (including a wheelchair)?: A Little Help needed standing up from a chair using  your arms (e.g., wheelchair or bedside chair)?: A Little Help needed to walk in hospital room?: A Lot Help needed climbing 3-5 steps with a railing? : Total 6 Click Score: 15    End of Session   Activity Tolerance:  (limited by cognition) Patient left: in bed;with bed alarm set Nurse Communication: Mobility status PT Visit Diagnosis: Unsteadiness on feet (R26.81);Muscle weakness (generalized) (M62.81);Difficulty in walking, not elsewhere classified (R26.2)     Time: 5997-7414 PT Time Calculation (min) (ACUTE ONLY): 10 min  Charges:  $Therapeutic Activity: 8-22 mins                     [name], PT, DPT, GCS [phone]    Trianna Lupien 06/26/2022, 4:39 PM

## 2022-06-26 NOTE — Progress Notes (Addendum)
       CROSS COVER NOTE  NAME: Carl Bell MRN: [id number] DOB : 26-Apr-1951    HPI/Events of Note   Notified by nurse patient is agitated, restless, trying to get out of bed and unable to redirect.  Also reported good effect from lorazepam given night before for the same behavior  Assessment and  Interventions   Assessment: Last QTc measured 490; high falls risk Plan: Lorazepam 0. 5 mg once ordered Telesitter monitoring ordered Check cbg      Kathlene Cote NP [facility]  2233 update Hypoglycemic at 35 reported - patient able to drink some ensure  1/2 amp D50 ordered f/b D10 and 20 ml/h 2. CBG monitoring every 4 h  Kathlene Cote NP [facility]

## 2022-06-26 NOTE — Progress Notes (Signed)
Occupational Therapy Treatment Patient Details Name: Carl Bell MRN: [id number] DOB: [birth year] Today's Date: 06/26/2022   History of present illness Carl Bell is a 71 y.o. male with past medical history significant for low vision, COPD with chronic respiratory failure on 2 L of oxygen, hypertension, history of lung cancer status post chemoradiation therapy, history of liver cirrhosis and hypertension who presents to the ER for evaluation of a 3-day history of constipation associated with diffuse abdominal pain. S/p Hartman's procedure for sigmoid volvulus on 06/23/22.   OT comments  [name] was seen for OT treatment on this date. Upon arrival to room pt reclined in bed, attempting to exit with BLE over rail, agreeable to tx. Increased confusion, oriented to self and time only. Pt requires MOD A bed level mobility. MOD A sit<>stand urinating in standing, unable to achieve upright posture. MOD A self-feeding at bed level. Pt making progress toward goals, limited by cognition, will continue to follow POC. Discharge recommendation remains appropriate.     Recommendations for follow up therapy are one component of a multi-disciplinary discharge planning process, led by the attending physician.  Recommendations may be updated based on patient status, additional functional criteria and insurance authorization.    Follow Up Recommendations  Skilled nursing-short term rehab (<3 hours/day)     Assistance Recommended at Discharge Frequent or constant Supervision/Assistance  Patient can return home with the following  A little help with walking and/or transfers;A little help with bathing/dressing/bathroom;Help with stairs or ramp for entrance   Equipment Recommendations  BSC/3in1    Recommendations for Other Services      Precautions / Restrictions Precautions Precautions: Fall Restrictions Weight Bearing Restrictions: No       Mobility Bed Mobility Overal bed mobility: Needs  Assistance Bed Mobility: Supine to Sit, Sit to Supine     Supine to sit: Mod assist Sit to supine: Mod assist   General bed mobility comments: increased assist 2/2 cognition    Transfers Overall transfer level: Needs assistance Equipment used: 1 person hand held assist Transfers: Sit to/from Stand Sit to Stand: Mod assist                 Balance Overall balance assessment: Needs assistance Sitting-balance support: No upper extremity supported, Feet supported Sitting balance-Leahy Scale: Good     Standing balance support: Single extremity supported, During functional activity Standing balance-Leahy Scale: Poor                             ADL either performed or assessed with clinical judgement   ADL Overall ADL's : Needs assistance/impaired                                       General ADL Comments: MOD A urinating in standing, unable to achieve upright posture. MOD A self-feeding at bed level    Extremity/Trunk Assessment Upper Extremity Assessment Upper Extremity Assessment: Generalized weakness   Lower Extremity Assessment Lower Extremity Assessment: Generalized weakness         Cognition Arousal/Alertness: Awake/alert Behavior During Therapy: WFL for tasks assessed/performed Overall Cognitive Status: No family/caregiver present to determine baseline cognitive functioning Area of Impairment: Orientation, Attention, Following commands, Problem solving                 Orientation Level: Disoriented to, Place, Situation Current Attention Level: Focused  Following Commands: Follows one step commands consistently, Follows one step commands with increased time     Problem Solving: Slow processing, Decreased initiation, Difficulty sequencing, Requires verbal cues, Requires tactile cues                     Pertinent Vitals/ Pain       Pain Assessment Pain Assessment: No/denies pain   Frequency  Min 2X/week         Progress Toward Goals  OT Goals(current goals can now be found in the care plan section)  Progress towards OT goals: Progressing toward goals  Acute Rehab OT Goals Patient Stated Goal: go home OT Goal Formulation: With patient Time For Goal Achievement: 07/09/22 Potential to Achieve Goals: Good ADL Goals Pt Will Perform Grooming: with modified independence;standing Pt Will Perform Lower Body Dressing: Independently;sit to/from stand Pt Will Transfer to Toilet: with modified independence;ambulating;regular height toilet  Plan Discharge plan remains appropriate;Frequency needs to be updated    Co-evaluation                 AM-PAC OT "6 Clicks" Daily Activity     Outcome Measure   Help from another person eating meals?: None Help from another person taking care of personal grooming?: A Little Help from another person toileting, which includes using toliet, bedpan, or urinal?: A Little Help from another person bathing (including washing, rinsing, drying)?: A Little Help from another person to put on and taking off regular upper body clothing?: None Help from another person to put on and taking off regular lower body clothing?: A Little 6 Click Score: 20    End of Session    OT Visit Diagnosis: Other abnormalities of gait and mobility (R26.89);Muscle weakness (generalized) (M62.81)   Activity Tolerance Patient tolerated treatment well   Patient Left in bed;with call bell/phone within reach;with bed alarm set   Nurse Communication Mobility status        Time: 4481-8563 OT Time Calculation (min): 33 min  Charges: OT General Charges $OT Visit: 1 Visit OT Treatments $Self Care/Home Management : 23-37 mins  Carl Bell, M.S. OTR/L  06/26/22, 1:39 PM  ascom [phone]

## 2022-06-26 NOTE — Progress Notes (Signed)
Consultation Progress Note   Patient: Carl Bell WUJ:[id number] DOB: 01-04-1951 DOA: 06/22/2022 DOS: the patient was seen and examined on 06/26/2022 Primary service: [name], DO  Brief hospital course: Taken from prior notes.  Carl Bell is a 71 y.o. male with past medical history significant for COPD with chronic respiratory failure on 2 L of oxygen, hypertension, history of lung cancer status post chemoradiation therapy, history of liver cirrhosis and hypertension who presents to the ER for evaluation of a 3-day history of constipation associated with diffuse abdominal pain with localization to the periumbilical area rated an 8 x 10 in intensity at its worst.  There is no radiation of the pain but it is associated with nausea and vomiting.  He admits to a 22-month unintentional 50 pound weight loss.  He denies having any alleviating or aggravating factors.   CT scan of abdomen and pelvis which showed high-grade distal colonic obstruction due to sigmoid volvulus. Large right groin hernia containing cecum, ileum, and ascites. Chronic calcific pancreatitis.  14 mm presumed pseudocyst. Bilateral complicated renal cysts that are size stable from a PET CT in 2017. Advanced atherosclerosis.   Patient has been admitted to the surgical service and medical consult requested to manage patient's multiple comorbidities.   S/p sigmoid colectomy on 06/23/2022.  12/12: Hemodynamically stable but labs pertinent for potassium of 7.1-ordered EKG, calcium gluconate, albuterol and insulin per hyperkalemia order set protocol.  Patient is n.p.o. so cannot use Lokelma or Kayexalate.  Apparently patient was on IV infusion with potassium which was immediately discontinued. Magnesium was also low at 1.6-replating.  CBC with decrease of hemoglobin to 9.2 postoperatively, checking anemia panel, which shows anemia of chronic disease with some iron deficiency. Ordered IV iron as patient is n.p.o. Patient was  started on clear liquid diet after having stool in colostomy bag.  12/13: Hemodynamically stable, hemoglobin started improving to 9.9, hyperkalemia resolved with potassium of 4.5 and magnesium of 2.2 today.  Overnight leakage from wound which is being managed by surgical team.  Might need to repeat imaging.  12/14: Patient appears more lethargic today.  Apparently received Ativan after midnight due to agitation and pulling on lines.  Arousable and denies any pain, going back to sleep pretty quickly.  Repeat labs seems stable.  Patient seems very high risk for deterioration and mortality based on underlying comorbidities and poor functional status.  PT/OT are recommending SNF.   Assessment and Plan: * Volvulus of intestine (HCC) S/p sigmoid colectomy.  Having bowel movement in colostomy bag. -Being started on clear liquid diet by general surgery. -Management by general surgery  Acute postoperative anemia due to expected blood loss Hemoglobin decreased to 9.2>>9.9 postsurgically.  Anemia panel with anemia of chronic disease with some iron deficiency. -Ordered 2 doses of IV iron-received second dose today -Monitor hemoglobin -Transfuse if below 7  Hyperkalemia Resolved, potassium at 4.5 Potassium at 7.1, apparently he was on potassium infusion which was immediately discontinued.  No EKG changes. -Received calcium gluconate, albuterol and insulin with D50. -Continue to monitor  Hypertension Currently blood pressure within goal. -IV hydralazine as needed  COPD (chronic obstructive pulmonary disease) ([place]) Patient has a history of COPD with chronic respiratory failure on 2 L of oxygen continuous. Stable and not acutely exacerbated Continue as needed bronchodilator therapy as well as inhaled steroids Continue oxygen supplementation to maintain pulse oximetry greater than 92%   Hypokalemia Resolved.  Now had hyperkalemia. -Please see above   DVT prophylaxis.  Lovenox  TRH will  continue to follow the patient.  Subjective: Patient was very somnolent when seen today.  Denies any pain  Physical Exam: Vitals:   06/25/22 1624 06/25/22 1940 06/26/22 0430 06/26/22 0736  BP: 106/79 119/77 (!) 120/90 (!) 142/95  Pulse: 80 83 70 82  Resp:  18 20 18   Temp:  97.7 F (36.5 C) (!) 97 F (36.1 C) 98.1 F (36.7 C)  TempSrc:  Axillary    SpO2:  96% 96% 96%  Weight:      Height:       General.  Cachectic gentleman, in no acute distress. Pulmonary.  Lungs clear bilaterally, normal respiratory effort. CV.  Regular rate and rhythm, no JVD, rub or murmur. Abdomen.  Soft, nontender, nondistended, BS positive.  Colostomy bag in place CNS.  Alert and oriented .  No focal neurologic deficit. Extremities.  No edema, no cyanosis, pulses intact and symmetrical.  Data Reviewed: Prior data reviewed  Family Communication: Being done by primary team  Time spent: 44 minutes.  This record has been created using Systems analyst. Errors have been sought and corrected,but may not always be located. Such creation errors do not reflect on the standard of care.   Author: Lorella Nimrod, MD 06/26/2022 3:41 PM  For on call review [email or web address].

## 2022-06-26 NOTE — Consult Note (Signed)
[place] [facility] Nurse ostomy consult note Stoma type/location: LLQ, end colostomy  Stomal assessment/size: 1 3/4" round, slightly budded, with some darkening of mucosa circumferentially, appears it will slough away  Peristomal assessment: intact  Treatment options for stomal/peristomal skin: 2" skin barrier ring Output liquid green/brown Ostomy pouching: 2pc.  Education provided: none, patient is low vision with limited capacity to learn care. Verified with mother she is not able to reliable come in for any teaching and has heard from [facility] that he will be a rehab candidate. She is agreeable with that. Is not able to provide the level of care potentially needed for new ostomy patient.  Enrolled patient in [place] program: Yes. Patient will DC to SNF  [place] Nurse will follow along with you for continued support with ostomy teaching and care [place] MSN, RN, [place], [place], [place]

## 2022-06-27 DIAGNOSIS — K562 Volvulus: Secondary | ICD-10-CM | POA: Diagnosis not present

## 2022-06-27 LAB — GLUCOSE, CAPILLARY
Glucose-Capillary: 109 mg/dL — ABNORMAL HIGH (ref 70–99)
Glucose-Capillary: 135 mg/dL — ABNORMAL HIGH (ref 70–99)
Glucose-Capillary: 130 mg/dL — ABNORMAL HIGH (ref 70–99)
Glucose-Capillary: 134 mg/dL — ABNORMAL HIGH (ref 70–99)
Glucose-Capillary: 148 mg/dL — ABNORMAL HIGH (ref 70–99)
Glucose-Capillary: 126 mg/dL — ABNORMAL HIGH (ref 70–99)

## 2022-06-27 MED ORDER — QUETIAPINE FUMARATE 25 MG PO TABS
25.0000 mg | ORAL_TABLET | Freq: Every day | ORAL | Status: DC
  Administered 2022-06-27 – 2022-07-17 (×21): 25 mg via ORAL
  Filled 2022-06-27 (×21): qty 1

## 2022-06-27 NOTE — Care Management Important Message (Signed)
Important Message  Patient Details  Name: Carl Bell MRN: [id number] Date of Birth: 1951/06/29   Medicare Important Message Given:  Yes     Dannette Barbara 06/27/2022, 11:54 AM

## 2022-06-27 NOTE — Discharge Summary (Incomplete)
Physician Discharge Summary  Patient ID: Carl Bell MRN: [id number] DOB/AGE: 03-23-1951 71 y.o.  Admit date: 06/22/2022 Discharge date: ***  Admission Diagnoses: Sigmoid volvulus, COPD, blindness  Discharge Diagnoses:  Same as above  Discharged Condition: good  Hospital Course: admitted for above. Underwent decompression colonoscopy followed by sigmoidectomy with end colostomy creation.  Please see op note for details.  Post op, recovered as expected from GI standpoint but noted to have increasing confusion.  PT evaluation recommended SNF placement.    While pending placement, patient's urine noted to be darker in color.  Repeat labs noted increasing creatinine and some proteinuria and hematuria.  Nephrology consulted.   At time of d/c, tolerating diet and pain controlled.  Patient will follow-up in the surgery office as an outpatient for staple removal around postop 7-10.  Ostomy care as needed.  Okay to start regular diet.  No lifting greater than 10 pounds, no push pull greater than 30 pounds for 4 weeks postop  Consults: Hospitalist consult for end-stage COPD comanagement, nephrology consult for acute kidney injury  Discharge Exam: Blood pressure (!) 140/117, pulse 99, temperature (!) 97.1 F (36.2 C), resp. rate 16, height 6\' 1"  (1.854 m), weight 53.1 kg, SpO2 93 %. {physical LEXN:[id number]}  Disposition:     Allergies as of 06/27/2022   No Known Allergies   Med Rec must be completed prior to using this [place]***       Follow-up Information     [name], DO Follow up in 1 week(s).   Specialty: Surgery Why: staple removal post op Contact information: [address] [place] [address] [phone]                  Total time spent arranging discharge was >62min. Signed: [name] 06/27/2022, 1:15 PM

## 2022-06-27 NOTE — Progress Notes (Signed)
Subjective:  CC: Carl Bell is a 71 y.o. male  Hospital stay day 5, 4 Days Post-Op Hartman's procedure for sigmoid volvulus  HPI: Increased confusion overnight with hypoglycemia.  ROS:  General: Denies weight loss, weight gain, fatigue, fevers, chills, and night sweats. Heart: Denies chest pain, palpitations, racing heart, irregular heartbeat, leg pain or swelling, and decreased activity tolerance. Respiratory: Denies breathing difficulty, shortness of breath, wheezing, cough, and sputum. GI: Denies change in appetite, heartburn, nausea, vomiting, constipation, diarrhea, and blood in stool. GU: Denies difficulty urinating, pain with urinating, urgency, frequency, blood in urine.   Objective:   Temp:  [97.1 F (36.2 C)-98.3 F (36.8 C)] 97.1 F (36.2 C) (12/15 0722) Pulse Rate:  [97-102] 99 (12/15 0722) Resp:  [16-20] 16 (12/15 0722) BP: (140-163)/(93-117) 140/117 (12/15 0722) SpO2:  [93 %-100 %] 93 % (12/15 0722)     Height: 6\' 1"  (185.4 cm) Weight: 53.1 kg BMI (Calculated): 15.44   Intake/Output this shift:   Intake/Output Summary (Last 24 hours) at 06/27/2022 0917 Last data filed at 06/27/2022 0542 Gross per 24 hour  Intake 116.25 ml  Output 0 ml  Net 116.25 ml    Constitutional :  alert, cooperative, appears stated age, and no distress  Respiratory:  clear to auscultation bilaterally  Cardiovascular:  regular rate and rhythm  Gastrointestinal: Soft, no guarding, ostomy intact  Skin: Cool and moist.  Staple line clean dry and intact  Psychiatric: Normal affect, non-agitated, not confused       LABS:     Latest Ref Rng & Units 06/26/2022   11:46 AM 06/25/2022    4:51 AM 06/24/2022    9:52 AM  CMP  Glucose 70 - 99 mg/dL 90  115  133   BUN 8 - 23 mg/dL 17  20  21    Creatinine 0.61 - 1.24 mg/dL 0.66  0.81  1.13   Sodium 135 - 145 mmol/L 138  141  143   Potassium 3.5 - 5.1 mmol/L 4.0  4.5  5.1   Chloride 98 - 111 mmol/L 107  111  111   CO2 22 - 32 mmol/L 26   28  21    Calcium 8.9 - 10.3 mg/dL 8.4  8.4  8.3       Latest Ref Rng & Units 06/26/2022   11:46 AM 06/25/2022    4:51 AM 06/24/2022    6:32 AM  CBC  WBC 4.0 - 10.5 K/uL 4.8  6.1  8.5   Hemoglobin 13.0 - 17.0 g/dL 10.4  9.9  9.2   Hematocrit 39.0 - 52.0 % 33.2  30.8  29.3   Platelets 150 - 400 K/uL 232  223  254     RADS: N/a Assessment:   Sigmoid volvulus, s/p decompression colonoscopy, subsequent Hartman's procedure  Stable.  Abdomen remains soft with no distention.  Despite no recorded stool output yesterday.  Okay to continue regular diet for now as long as abdomen is not distended.  Colace as needed.  Final dispo will be SNF.  Pending bed placement.   labs/images/medications/previous chart entries reviewed personally and relevant changes/updates noted above.

## 2022-06-27 NOTE — TOC Progression Note (Signed)
Transition of Care [facility]) - Progression Note    Patient Details  Name: Carl Bell MRN: [id number] Date of Birth: [birth year]  [facility]) CM/SW Contact  Gerilyn Pilgrim, LCSW Phone Number: 06/27/2022, 10:50 AM  Clinical Narrative:    SW spoke with daughter regarding bed offers and provided list. Daughter will review and follow up with preference.   Expected Discharge Plan: [place] Barriers to Discharge: Continued Medical Work up  Expected Discharge Plan and Services Expected Discharge Plan: [place] Choice:  (TBD) Living arrangements for the past 2 months: Single Family Home                                       Social Determinants of Health (SDOH) Interventions    Readmission Risk Interventions    06/24/2022   11:08 AM  Readmission Risk Prevention Plan  Transportation Screening Complete  PCP or Specialist Appt within 3-5 Days Complete  Social Work Consult for [place] Planning/Counseling Complete  Palliative Care Screening Not Applicable  Medication Review Press photographer) Complete

## 2022-06-27 NOTE — TOC Progression Note (Addendum)
Transition of Care [facility]) - Progression Note    Patient Details  Name: Carl Bell MRN: [id number] Date of Birth: [birth year]  Transition of Care [facility]) CM/SW [place], LCSW Phone Number: 06/27/2022, 9:52 AM  Clinical Narrative: Yesterday's RN requested that CSW call sister regarding bed offers. Left her a voicemail. All facilities in [facility] declined. Has offers from [facility] in [place] and [facility] in [place]. Expanded search to more facilities in [place].  4:02 pm: Updated daughter. She is researching current offers. She is aware patient is medically stable for discharge, per surgeon.  4:15 pm: Received call back from daughter. Her brother is considering bringing patient home with him. Discussed home health services and education they could receive regarding colostomy management prior to discharge. She will give information to her brother and follow up with decision.  Expected Discharge Plan: [place] Barriers to Discharge: Continued Medical Work up  Expected Discharge Plan and Services Expected Discharge Plan: [place] Choice:  (TBD) Living arrangements for the past 2 months: Single Family Home                                       Social Determinants of Health (SDOH) Interventions    Readmission Risk Interventions    06/24/2022   11:08 AM  Readmission Risk Prevention Plan  Transportation Screening Complete  PCP or Specialist Appt within 3-5 Days Complete  Social Work Consult for [place] Planning/Counseling Complete  Palliative Care Screening Not Applicable  Medication Review Press photographer) Complete

## 2022-06-27 NOTE — Plan of Care (Signed)
  Problem: Health Behavior/Discharge Planning: Goal: Ability to manage health-related needs will improve Outcome: Progressing   Problem: Clinical Measurements: Goal: Will remain free from infection Outcome: Progressing   Problem: Activity: Goal: Risk for activity intolerance will decrease Outcome: Progressing   Problem: Elimination: Goal: Will not experience complications related to bowel motility Outcome: Progressing Goal: Will not experience complications related to urinary retention Outcome: Progressing   Problem: Pain Managment: Goal: General experience of comfort will improve Outcome: Progressing   Problem: Safety: Goal: Ability to remain free from injury will improve Outcome: Progressing   Problem: Skin Integrity: Goal: Risk for impaired skin integrity will decrease Outcome: Progressing

## 2022-06-27 NOTE — Assessment & Plan Note (Signed)
S/p sigmoid colectomy.  Having bowel movement in colostomy bag. -Diet was advanced to regular by general surgery. -Management by general surgery

## 2022-06-27 NOTE — Progress Notes (Signed)
Consultation Progress Note   Patient: Carl Bell VOZ:[id number] DOB: Feb 18, 1951 DOA: 06/22/2022 DOS: the patient was seen and examined on 06/27/2022 Primary service: [name], DO  Brief hospital course: Taken from prior notes.  Carl Bell is a 71 y.o. male with past medical history significant for COPD with chronic respiratory failure on 2 L of oxygen, hypertension, history of lung cancer status post chemoradiation therapy, history of liver cirrhosis and hypertension who presents to the ER for evaluation of a 3-day history of constipation associated with diffuse abdominal pain with localization to the periumbilical area rated an 8 x 10 in intensity at its worst.  There is no radiation of the pain but it is associated with nausea and vomiting.  He admits to a 73-month unintentional 50 pound weight loss.  He denies having any alleviating or aggravating factors.   CT scan of abdomen and pelvis which showed high-grade distal colonic obstruction due to sigmoid volvulus. Large right groin hernia containing cecum, ileum, and ascites. Chronic calcific pancreatitis.  14 mm presumed pseudocyst. Bilateral complicated renal cysts that are size stable from a PET CT in 2017. Advanced atherosclerosis.   Patient has been admitted to the surgical service and medical consult requested to manage patient's multiple comorbidities.   S/p sigmoid colectomy on 06/23/2022.  12/12: Hemodynamically stable but labs pertinent for potassium of 7.1-ordered EKG, calcium gluconate, albuterol and insulin per hyperkalemia order set protocol.  Patient is n.p.o. so cannot use Lokelma or Kayexalate.  Apparently patient was on IV infusion with potassium which was immediately discontinued. Magnesium was also low at 1.6-replating.  CBC with decrease of hemoglobin to 9.2 postoperatively, checking anemia panel, which shows anemia of chronic disease with some iron deficiency. Ordered IV iron as patient is n.p.o. Patient was  started on clear liquid diet after having stool in colostomy bag.  12/13: Hemodynamically stable, hemoglobin started improving to 9.9, hyperkalemia resolved with potassium of 4.5 and magnesium of 2.2 today.  Overnight leakage from wound which is being managed by surgical team.  Might need to repeat imaging.  12/14: Patient appears more lethargic today.  Apparently received Ativan after midnight due to agitation and pulling on lines.  Arousable and denies any pain, going back to sleep pretty quickly.  Repeat labs seems stable.  12/15: Patient again became agitated overnight and 1 dose of Ativan 0.5 mg given.  Starting him on Seroquel 25 mg daily at bedtime.  Diet was advanced to regular but p.o. intake remained poor.  Patient seems very high risk for deterioration and mortality based on underlying comorbidities and poor functional status.  PT/OT are recommending SNF.   Assessment and Plan: * Volvulus of intestine (HCC) S/p sigmoid colectomy.  Having bowel movement in colostomy bag. -Diet was advanced to regular by general surgery. -Management by general surgery  Acute postoperative anemia due to expected blood loss Hemoglobin decreased to 9.2>>9.9>>10.4 postsurgically.  Anemia panel with anemia of chronic disease with some iron deficiency. -Received 2 doses of IV iron- -Monitor hemoglobin -Transfuse if below 7  Hyperkalemia Resolved, potassium at 4.5 Potassium at 7.1, apparently he was on potassium infusion which was immediately discontinued.  No EKG changes. -Received calcium gluconate, albuterol and insulin with D50. -Continue to monitor  Hypertension Currently blood pressure within goal. -IV hydralazine as needed  COPD (chronic obstructive pulmonary disease) ([place]) Patient has a history of COPD with chronic respiratory failure on 2 L of oxygen continuous. Stable and not acutely exacerbated Continue as needed bronchodilator therapy as  well as inhaled steroids Continue oxygen  supplementation to maintain pulse oximetry greater than 92%   Hypokalemia Resolved.  Now had hyperkalemia. -Please see above   DVT prophylaxis.  Lovenox  TRH will continue to follow the patient.  Subjective: Patient was seen and examined today.  He was more alert and drinking Ensure with the help of daughter.  No nausea or vomiting.  Physical Exam: Vitals:   06/26/22 1600 06/26/22 1948 06/27/22 0432 06/27/22 0722  BP: (!) 147/93 (!) 163/105 (!) 156/99 (!) 140/117  Pulse: (!) 102 (!) 101 97 99  Resp: 18 18 20 16   Temp: 98.3 F (36.8 C) (!) 97.5 F (36.4 C) (!) 97.5 F (36.4 C) (!) 97.1 F (36.2 C)  TempSrc:  Oral    SpO2: 100% 99% 100% 93%  Weight:      Height:       General.  Cachectic gentleman, in no acute distress. Pulmonary.  Lungs clear bilaterally, normal respiratory effort. CV.  Regular rate and rhythm, no JVD, rub or murmur. Abdomen.  Soft, nontender, nondistended, BS positive.  Colostomy bag in place CNS.  Alert and oriented .  No focal neurologic deficit. Extremities.  No edema, no cyanosis, pulses intact and symmetrical. Psychiatry.  Appears to have some cognitive impairment  Data Reviewed: Prior data reviewed  Family Communication: Discussed with daughter at bedside  Time spent: 40 minutes.  This record has been created using Systems analyst. Errors have been sought and corrected,but may not always be located. Such creation errors do not reflect on the standard of care.   Author: Lorella Nimrod, MD 06/27/2022 4:05 PM  For on call review [email or web address].

## 2022-06-27 NOTE — Discharge Instructions (Signed)
Laparoscopic Colectomy, Care After This sheet gives you information about how to care for yourself after your procedure. Your health care provider may also give you more specific instructions. If you have problems or questions, contact your health care provider. What can I expect after the procedure? After your procedure, it is common to have the following: Pain in your abdomen, especially in the incision areas. You will be given medicine to control the pain. Tiredness. This is a normal part of the recovery process. Your energy level will return to normal over the next several weeks. Changes in your bowel movements, such as constipation or needing to go more often. Talk with your health care provider about how to manage this. Follow these instructions at home: Medicines  tylenol and advil as needed for discomfort.  Please alternate between the two every four hours as needed for pain.    Use narcotics, if prescribed, only when tylenol and motrin is not enough to control pain.  325-650mg  every 8hrs to max of 4000mg /24hrs (including the 325mg  in every norco dose) for the tylenol.    Advil up to 800mg  per dose every 8hrs as needed for pain.   Do not drive or use heavy machinery while taking prescription pain medicine. Do not drink alcohol while taking prescription pain medicine. If you were prescribed an antibiotic medicine, use it as told by your health care provider. Do not stop using the antibiotic even if you start to feel better. Incision care    Follow instructions from your health care provider about how to take care of your incision areas. Make sure you: Keep your incisions clean and dry. Wash your hands with soap and water before and after applying medicine to the areas, and before and after changing your bandage (dressing). If soap and water are not available, use hand sanitizer. Change your dressing as told by your health care provider. Leave stitches (sutures), skin glue, or adhesive  strips in place. These skin closures may need to stay in place for 2 weeks or longer. If adhesive strip edges start to loosen and curl up, you may trim the loose edges. Do not remove adhesive strips completely unless your health care provider tells you to do that. Do not wear tight clothing over the incisions. Tight clothing may rub and irritate the incision areas, which may cause the incisions to open. Do not take baths, swim, or use a hot tub until your health care provider approves. OK TO SHOWER.   Check your incision area every day for signs of infection. Check for: More redness, swelling, or pain. More fluid or blood. Warmth. Pus or a bad smell. Activity Avoid lifting anything that is heavier than 10 lb (4.5 kg) for 2 weeks or until your health care provider says it is okay. You may resume normal activities as told by your health care provider. Ask your health care provider what activities are safe for you. Take rest breaks during the day as needed. Eating and drinking Follow instructions from your health care provider about what you can eat after surgery. To prevent or treat constipation while you are taking prescription pain medicine, your health care provider may recommend that you: Drink enough fluid to keep your urine clear or pale yellow. Take over-the-counter or prescription medicines. Eat foods that are high in fiber, such as fresh fruits and vegetables, whole grains, and beans. Limit foods that are high in fat and processed sugars, such as fried and sweet foods. General instructions Ask your  health care provider when you will need an appointment to get your sutures or staples removed. Keep all follow-up visits as told by your health care provider. This is important. Contact a health care provider if: You have more redness, swelling, or pain around your incisions. You have more fluid or blood coming from the incisions. Your incisions feel warm to the touch. You have pus or a  bad smell coming from your incisions or your dressing. You have a fever. You have an incision that breaks open (edges not staying together) after sutures or staples have been removed. Get help right away if: You develop a rash. You have chest pain or difficulty breathing. You have pain or swelling in your legs. You feel light-headed or you faint. Your abdomen swells (becomes distended). You have nausea or vomiting. You have blood in your stool (feces). This information is not intended to replace advice given to you by your health care provider. Make sure you discuss any questions you have with your health care provider. Document Released: 01/17/2005 Document Revised: 03/19/2018 Document Reviewed: 03/31/2016 Elsevier Interactive Patient Education  2019 [employer].

## 2022-06-28 DIAGNOSIS — K562 Volvulus: Principal | ICD-10-CM | POA: Diagnosis present

## 2022-06-28 LAB — GLUCOSE, CAPILLARY
Glucose-Capillary: 109 mg/dL — ABNORMAL HIGH (ref 70–99)
Glucose-Capillary: 114 mg/dL — ABNORMAL HIGH (ref 70–99)
Glucose-Capillary: 107 mg/dL — ABNORMAL HIGH (ref 70–99)
Glucose-Capillary: 104 mg/dL — ABNORMAL HIGH (ref 70–99)
Glucose-Capillary: 153 mg/dL — ABNORMAL HIGH (ref 70–99)
Glucose-Capillary: 108 mg/dL — ABNORMAL HIGH (ref 70–99)

## 2022-06-28 NOTE — Plan of Care (Signed)
  Problem: Education: Goal: Knowledge of General Education information will improve Description Including pain rating scale, medication(s)/side effects and non-pharmacologic comfort measures Outcome: Progressing   

## 2022-06-28 NOTE — Progress Notes (Signed)
06/28/2022  Subjective: No acute events overnight.  Patient reports this morning she was having some nausea but otherwise denies any worsening pain.  He has not been eating much and there is no bowel movement recorded yesterday.  He remains on a regular diet.  Currently pending SNF placement.  Vital signs: Temp:  [97.5 F (36.4 C)-97.8 F (36.6 C)] 97.6 F (36.4 C) (12/16 0751) Pulse Rate:  [85-90] 86 (12/16 0751) Resp:  [16-20] 16 (12/16 0751) BP: (107-155)/(58-90) 107/58 (12/16 0751) SpO2:  [83 %-100 %] 83 % (12/16 0751) Weight:  [54 kg] 54 kg (12/16 0456)   Intake/Output: 12/15 0701 - 12/16 0700 In: 702.4 [P.O.:240; I.V.:462.4] Out: 300 [Urine:300] Last BM Date : 06/25/22  Physical Exam: Constitutional: No acute distress, appears deconditioned. Abdomen: Soft, nondistended, only some soreness to palpation.  Midline incision is clean, dry, intact with staples in place.  Left lower quadrant ostomy with currently no gas or stool in the bag.  Labs:  Recent Labs    06/26/22 1146  WBC 4.8  HGB 10.4*  HCT 33.2*  PLT 232   Recent Labs    06/26/22 1146  NA 138  K 4.0  CL 107  CO2 26  GLUCOSE 90  BUN 17  CREATININE 0.66  CALCIUM 8.4*   No results for input(s): "LABPROT", "INR" in the last 72 hours.  Imaging: No results found.  Assessment/Plan: This is a 71 y.o. male status post Hartman's procedure for sigmoid volvulus.  - Appears patient is not been eating much at all he reports that he was having some nausea earlier.  Right now the ostomy does not have any gas or stool in the bag and no bowel movement recorded since initial bowel movement 2 days ago.  For now we will continue regular diet for him but if there is still no  further function tomorrow, may obtain KUB to further evaluate. - Encouraged p.o. intake as the patient is very malnourished and deconditioned.   I spent 35 minutes dedicated to the care of this patient on the date of this encounter to include  pre-visit review of records, face-to-face time with the patient discussing diagnosis and management, and any post-visit coordination of care.  Melvyn Neth, [facility]

## 2022-06-28 NOTE — Progress Notes (Signed)
Physical Therapy Treatment Patient Details Name: Carl Bell MRN: [id number] DOB: 1950/08/25 Today's Date: 06/28/2022   History of Present Illness Carl Bell is a 71 y.o. male with past medical history significant for low vision, COPD with chronic respiratory failure on 2 L of oxygen, hypertension, history of lung cancer status post chemoradiation therapy, history of liver cirrhosis and hypertension who presents to the ER for evaluation of a 3-day history of constipation associated with diffuse abdominal pain. S/p Hartman's procedure for sigmoid volvulus on 06/23/22.    PT Comments    Pt's family was present in the room which seemed to help motivate pt with OOB activities.  Pt reported back pain but was willing to work on sit<>stand transfers, standing EOB with HHA, and sidestepping along EOB; pt required Min A to closed supervision overall.  Pt declined further gait without reason and wanted to get back into bed at end of session.  Current PT d/c plan is appropriate.  Recommendations for follow up therapy are one component of a multi-disciplinary discharge planning process, led by the attending physician.  Recommendations may be updated based on patient status, additional functional criteria and insurance authorization.  Follow Up Recommendations  Skilled nursing-short term rehab (<3 hours/day) Can patient physically be transported by private vehicle: Yes   Assistance Recommended at Discharge Frequent or constant Supervision/Assistance  Patient can return home with the following A little help with walking and/or transfers;A little help with bathing/dressing/bathroom;Help with stairs or ramp for entrance   Equipment Recommendations  Other (comment) (TBD)    Recommendations for Other Services       Precautions / Restrictions Precautions Precautions: Fall Restrictions Weight Bearing Restrictions: No     Mobility  Bed Mobility Overal bed mobility: Needs Assistance Bed  Mobility: Supine to Sit, Sit to Supine     Supine to sit: Min guard Sit to supine: Min assist   General bed mobility comments: cues for sequencing and to stay on task.    Transfers Overall transfer level: Needs assistance Equipment used: 1 person hand held assist Transfers: Sit to/from Stand Sit to Stand: Min guard           General transfer comment: Pt stood up with son present HHA initially, then stood EOB at supervision level with son present.    Ambulation/Gait Ambulation/Gait assistance: Min assist Gait Distance (Feet): 5 Feet Assistive device: 1 person hand held assist Gait Pattern/deviations: Step-to pattern Gait velocity: decreased     General Gait Details: Pt tolerated side stepping along bed but then refused any further walking; no reason given.   Stairs             Wheelchair Mobility    Modified Rankin (Stroke Patients Only)       Balance Overall balance assessment: Needs assistance Sitting-balance support: No upper extremity supported, Feet supported Sitting balance-Leahy Scale: Good     Standing balance support: Single extremity supported, During functional activity Standing balance-Leahy Scale: Fair                              Cognition Arousal/Alertness: Awake/alert Behavior During Therapy: Restless Overall Cognitive Status: Impaired/Different from baseline Area of Impairment: Orientation, Attention, Following commands, Problem solving                   Current Attention Level: Focused   Following Commands: Follows one step commands consistently, Follows one step commands with increased time  Problem Solving: Slow processing, Decreased initiation, Difficulty sequencing, Requires verbal cues, Requires tactile cues General Comments: confused and restless.  Difficult to re-direct        Exercises Other Exercises Other Exercises: LAQ x5 ea.  QS x10ea with cues for technique    General Comments         Pertinent Vitals/Pain Pain Assessment Pain Assessment: Faces Faces Pain Scale: Hurts even more Breathing: normal Negative Vocalization: occasional moan/groan, low speech, negative/disapproving quality Facial Expression: facial grimacing Body Language: relaxed Consolability: distracted or reassured by voice/touch PAINAD Score: 4 Pain Location: back Pain Descriptors / Indicators: Aching Pain Intervention(s): Monitored during session    Home Living                          Prior Function            PT Goals (current goals can now be found in the care plan section) Acute Rehab PT Goals Patient Stated Goal: unable PT Goal Formulation: With patient Time For Goal Achievement: 07/09/22 Potential to Achieve Goals: Fair Progress towards PT goals: Progressing toward goals    Frequency    Min 2X/week      PT Plan Current plan remains appropriate    Co-evaluation              AM-PAC PT "6 Clicks" Mobility   Outcome Measure  Help needed turning from your back to your side while in a flat bed without using bedrails?: A Little Help needed moving from lying on your back to sitting on the side of a flat bed without using bedrails?: A Little Help needed moving to and from a bed to a chair (including a wheelchair)?: A Little Help needed standing up from a chair using your arms (e.g., wheelchair or bedside chair)?: A Little   Help needed climbing 3-5 steps with a railing? : Total 6 Click Score: 13    End of Session   Activity Tolerance: Patient limited by lethargy Patient left: in bed Nurse Communication: Mobility status PT Visit Diagnosis: Unsteadiness on feet (R26.81);Muscle weakness (generalized) (M62.81);Difficulty in walking, not elsewhere classified (R26.2)     Time: 4982-6415 PT Time Calculation (min) (ACUTE ONLY): 18 min  Charges:  $Therapeutic Activity: 8-22 mins                    Bjorn Loser, PTA  06/28/22, 1:21 PM

## 2022-06-28 NOTE — TOC Progression Note (Addendum)
Transition of Care [facility]) - Progression Note    Patient Details  Name: Carl Bell MRN: [id number] Date of Birth: [birth year]  Transition of Care [facility]) CM/SW [place], [place] Phone Number: 06/28/2022, 12:26 PM  Clinical Narrative:    Update: Malachy Mood with [facility] confirmed she can offer [facility] services for pt.   CSW spoke with pt's daughter to confirm preference of [place] vs SNF for pt, she states she chooses [facility], pt will be staying with her brother at [address] [place], [phone]. CSW asked pt's daughter if she understood that her brother would be pt's primary caretaker, she states she understands. CSW went over Medicare.gov ratings information, pt's daughter doesn't have a preference for [place]. CSW will reach out to some [facility] agencies for pt review.   MD made aware, pt will stay one more night, then dc in the morning.   Expected Discharge Plan: [place] Barriers to Discharge: Continued Medical Work up  Expected Discharge Plan and Services Expected Discharge Plan: [place] Choice:  (TBD) Living arrangements for the past 2 months: Single Family Home                                       Social Determinants of Health (SDOH) Interventions    Readmission Risk Interventions    06/24/2022   11:08 AM  Readmission Risk Prevention Plan  Transportation Screening Complete  PCP or Specialist Appt within 3-5 Days Complete  Social Work Consult for [place] Planning/Counseling Complete  Palliative Care Screening Not Applicable  Medication Review Press photographer) Complete

## 2022-06-28 NOTE — Progress Notes (Signed)
Consultation Progress Note   Patient: Carl Bell:[id number] DOB: Aug 28, 1950 DOA: 06/22/2022 DOS: the patient was seen and examined on 06/28/2022 Primary service: [name], DO  Brief hospital course: Taken from prior notes.  Carl Bell is a 71 y.o. male with past medical history significant for COPD with chronic respiratory failure on 2 L of oxygen, hypertension, history of lung cancer status post chemoradiation therapy, history of liver cirrhosis and hypertension who presents to the ER for evaluation of a 3-day history of constipation associated with diffuse abdominal pain with localization to the periumbilical area rated an 8 x 10 in intensity at its worst.  There is no radiation of the pain but it is associated with nausea and vomiting.  He admits to a 8-month unintentional 50 pound weight loss.  He denies having any alleviating or aggravating factors.   CT scan of abdomen and pelvis which showed high-grade distal colonic obstruction due to sigmoid volvulus. Large right groin hernia containing cecum, ileum, and ascites. Chronic calcific pancreatitis.  14 mm presumed pseudocyst. Bilateral complicated renal cysts that are size stable from a PET CT in 2017. Advanced atherosclerosis.   Patient has been admitted to the surgical service and medical consult requested to manage patient's multiple comorbidities.   S/p sigmoid colectomy on 06/23/2022.  12/12: Hemodynamically stable but labs pertinent for potassium of 7.1-ordered EKG, calcium gluconate, albuterol and insulin per hyperkalemia order set protocol.  Patient is n.p.o. so cannot use Lokelma or Kayexalate.  Apparently patient was on IV infusion with potassium which was immediately discontinued. Magnesium was also low at 1.6-replating.  CBC with decrease of hemoglobin to 9.2 postoperatively, checking anemia panel, which shows anemia of chronic disease with some iron deficiency. Ordered IV iron as patient is n.p.o. Patient was  started on clear liquid diet after having stool in colostomy bag.  12/13: Hemodynamically stable, hemoglobin started improving to 9.9, hyperkalemia resolved with potassium of 4.5 and magnesium of 2.2 today.  Overnight leakage from wound which is being managed by surgical team.  Might need to repeat imaging.  12/14: Patient appears more lethargic today.  Apparently received Ativan after midnight due to agitation and pulling on lines.  Arousable and denies any pain, going back to sleep pretty quickly.  Repeat labs seems stable.  12/15: Patient again became agitated overnight and 1 dose of Ativan 0.5 mg given.  Starting him on Seroquel 25 mg daily at bedtime.  Diet was advanced to regular but p.o. intake remained poor.  12/16: Patient with no new concern.  Family decided to take him home with home health services instead of rehab.  Patient started eating with the help of family.  Surgery and TOC to make arrangement.  No further need for internal medicine so we will sign off.  Patient seems very high risk for deterioration and mortality based on underlying comorbidities and poor functional status.  PT/OT are recommending SNF.   Assessment and Plan: * Volvulus of intestine (HCC) S/p sigmoid colectomy.  Having bowel movement in colostomy bag. -Diet was advanced to regular by general surgery. -Management by general surgery  Acute postoperative anemia due to expected blood loss Hemoglobin decreased to 9.2>>9.9>>10.4 postsurgically.  Anemia panel with anemia of chronic disease with some iron deficiency. -Received 2 doses of IV iron- -Monitor hemoglobin -Transfuse if below 7  Hyperkalemia Resolved, potassium at 4.5 Potassium at 7.1, apparently he was on potassium infusion which was immediately discontinued.  No EKG changes. -Received calcium gluconate, albuterol and insulin with  D50. -Continue to monitor  Hypertension Currently blood pressure within goal. -IV hydralazine as needed  COPD  (chronic obstructive pulmonary disease) ([place]) Patient has a history of COPD with chronic respiratory failure on 2 L of oxygen continuous. Stable and not acutely exacerbated Continue as needed bronchodilator therapy as well as inhaled steroids Continue oxygen supplementation to maintain pulse oximetry greater than 92%   Hypokalemia Resolved.  Now had hyperkalemia. -Please see above   DVT prophylaxis.  Lovenox  TRH will sign off-please call for any further need.  Subjective: Patient was seen and examined today.  No new concern.  Started eating and drinking with the help of family.  He was happy that he is going home.  Physical Exam: Vitals:   06/27/22 1607 06/28/22 0456 06/28/22 0751 06/28/22 1523  BP: (!) 155/90 116/78 (!) 107/58 (!) 88/65  Pulse: 90 85 86 89  Resp: 18 20 16 18   Temp: 97.8 F (36.6 C) (!) 97.5 F (36.4 C) 97.6 F (36.4 C) 97.6 F (36.4 C)  TempSrc: Axillary Oral    SpO2: 100% 100% (!) 83% 98%  Weight:  54 kg    Height:       General.  Cachectic gentleman, in no acute distress. Pulmonary.  Lungs clear bilaterally, normal respiratory effort. CV.  Regular rate and rhythm, no JVD, rub or murmur. Abdomen.  Soft, nontender, nondistended, BS positive. CNS.  Alert and oriented .  No focal neurologic deficit. Extremities.  No edema, no cyanosis, pulses intact and symmetrical. Psychiatry.  Appears to have some cognitive impairment  Data Reviewed: Prior data reviewed  Family Communication: Discussed with daughter and son at bedside  Time spent: 40 minutes.  This record has been created using Systems analyst. Errors have been sought and corrected,but may not always be located. Such creation errors do not reflect on the standard of care.   Author: Lorella Nimrod, MD 06/28/2022 3:29 PM  For on call review [email or web address].

## 2022-06-29 DIAGNOSIS — K562 Volvulus: Secondary | ICD-10-CM | POA: Diagnosis not present

## 2022-06-29 LAB — GLUCOSE, CAPILLARY
Glucose-Capillary: 102 mg/dL — ABNORMAL HIGH (ref 70–99)
Glucose-Capillary: 108 mg/dL — ABNORMAL HIGH (ref 70–99)
Glucose-Capillary: 109 mg/dL — ABNORMAL HIGH (ref 70–99)
Glucose-Capillary: 129 mg/dL — ABNORMAL HIGH (ref 70–99)
Glucose-Capillary: 143 mg/dL — ABNORMAL HIGH (ref 70–99)
Glucose-Capillary: 96 mg/dL (ref 70–99)

## 2022-06-29 MED ORDER — POLYETHYLENE GLYCOL 3350 17 G PO PACK
17.0000 g | PACK | Freq: Every day | ORAL | Status: DC
  Administered 2022-06-29 – 2022-07-18 (×19): 17 g via ORAL
  Filled 2022-06-29 (×19): qty 1

## 2022-06-29 NOTE — Progress Notes (Signed)
06/29/2022  Subjective: No acute events overnight.  His family has decided to bring the patient home instead of to a rehab or SNF.  As such, we have started coordinating home health services for him to be including physical therapy, Occupational Therapy, and nursing.  Denies any nausea or any significant abdominal pain.  Ostomy has not had much significant output yet, although his p.o. intake has also been poor.  Vital signs: Temp:  [97.3 F (36.3 C)-97.8 F (36.6 C)] 97.3 F (36.3 C) (12/17 0740) Pulse Rate:  [84-93] 84 (12/17 0740) Resp:  [16-18] 16 (12/17 0740) BP: (88-116)/(59-78) 99/59 (12/17 0740) SpO2:  [98 %-100 %] 100 % (12/17 0740)   Intake/Output: 12/16 0701 - 12/17 0700 In: 464.3 [P.O.:100; I.V.:364.3] Out: 10 [Stool:10] Last BM Date :  (very small amount of liquid stool on colostomy)  Physical Exam: Constitutional: No acute distress Abdomen: Soft, nondistended, nontender to palpation.  Midline incision is clean, dry, intact.  Left lower quadrant ostomy with stoma pink and patent but without any stool in the bag yet.  Labs:  Recent Labs    06/26/22 1146  WBC 4.8  HGB 10.4*  HCT 33.2*  PLT 232   Recent Labs    06/26/22 1146  NA 138  K 4.0  CL 107  CO2 26  GLUCOSE 90  BUN 17  CREATININE 0.66  CALCIUM 8.4*   No results for input(s): "LABPROT", "INR" in the last 72 hours.  Imaging: No results found.  Assessment/Plan: This is a 71 y.o. male status post Hartman's procedure for sigmoid volvulus.  - Patient's family has decided to bring the patient home rather than getting him to SNF for rehab.  Home health services are being coordinated.  WOC RN has not been able to teach patient much yet on ostomy appliance and management.  As such, will await till tomorrow when they can come and see him and also teach the family in preparation for home discharge. - We will continue regular diet.  Will also add MiraLAX daily to help stimulate bowel function.   I spent  35 minutes dedicated to the care of this patient on the date of this encounter to include pre-visit review of records, face-to-face time with the patient discussing diagnosis and management, and any post-visit coordination of care.  Melvyn Neth, [facility]

## 2022-06-29 NOTE — TOC CM/SW Note (Signed)
Patient is not able to walk the distance required to go the bathroom, or he/she is unable to safely negotiate stairs required to access the bathroom.  A 3in1 BSC will alleviate this problem  

## 2022-06-29 NOTE — TOC Progression Note (Addendum)
Transition of Care [facility]) - Progression Note    Patient Details  Name: Carl Bell MRN: [id number] Date of Birth: 1951-06-12  Transition of Care [facility]) CM/SW [place], [place] Phone Number: 06/29/2022, 12:05 PM  Clinical Narrative:    CSW spoke with patient's daughter, informing her of OT evaluation recommendation for a 3 in 1. She was appreciative for the update and would like equipment to be ordered. Provider updated  1:34 PM CSW contacted [place], with [place] to order 3 in 1. She agreed to have it delivered to patient 06/30/22 in the morning  Expected Discharge Plan: [place] Barriers to Discharge: Continued Medical Work up  Expected Discharge Plan and Services Expected Discharge Plan: [place] Choice:  (TBD) Living arrangements for the past 2 months: Single Family Home                           HH Arranged: RN, PT, OT [facility] Agency: [place] Date [place]: 06/28/22 Time [place]: [id number] Representative spoke with at Media: [place] ([place]) Interventions    Readmission Risk Interventions    06/24/2022   11:08 AM  Readmission Risk Prevention Plan  Transportation Screening Complete  PCP or Specialist Appt within 3-5 Days Complete  Social Work Consult for [place] Planning/Counseling Complete  Palliative Care Screening Not Applicable  Medication Review Press photographer) Complete

## 2022-06-29 NOTE — Plan of Care (Signed)
  Problem: Education: Goal: Knowledge of General Education information will improve Description Including pain rating scale, medication(s)/side effects and non-pharmacologic comfort measures Outcome: Progressing   Problem: Health Behavior/Discharge Planning: Goal: Ability to manage health-related needs will improve Outcome: Progressing   

## 2022-06-30 LAB — GLUCOSE, CAPILLARY
Glucose-Capillary: 104 mg/dL — ABNORMAL HIGH (ref 70–99)
Glucose-Capillary: 108 mg/dL — ABNORMAL HIGH (ref 70–99)
Glucose-Capillary: 112 mg/dL — ABNORMAL HIGH (ref 70–99)
Glucose-Capillary: 115 mg/dL — ABNORMAL HIGH (ref 70–99)
Glucose-Capillary: 120 mg/dL — ABNORMAL HIGH (ref 70–99)
Glucose-Capillary: 69 mg/dL — ABNORMAL LOW (ref 70–99)
Glucose-Capillary: 80 mg/dL (ref 70–99)

## 2022-06-30 NOTE — Progress Notes (Signed)
Physical Therapy Treatment Patient Details Name: Carl Bell MRN: [id number] DOB: 1950-11-11 Today's Date: 06/30/2022   History of Present Illness Carl Bell is a 71 y.o. male with past medical history significant for low vision, COPD with chronic respiratory failure on 2 L of oxygen, hypertension, history of lung cancer status post chemoradiation therapy, history of liver cirrhosis and hypertension who presents to the ER for evaluation of a 3-day history of constipation associated with diffuse abdominal pain. S/p Hartman's procedure for sigmoid volvulus on 06/23/22.    PT Comments    Pt is making good progress towards goals with cognition improved this session. Upgrading dispo to reflect HHPT and 24/7 care. Pt continues to be high falls risk, however follows commands well and is agreeable to sitting in recliner this date. RN staff notified of breakfast tray arrival as pt will need assist for eating. Will continue to progress.   Recommendations for follow up therapy are one component of a multi-disciplinary discharge planning process, led by the attending physician.  Recommendations may be updated based on patient status, additional functional criteria and insurance authorization.  Follow Up Recommendations  Home health PT Can patient physically be transported by private vehicle: Yes   Assistance Recommended at Discharge Frequent or constant Supervision/Assistance  Patient can return home with the following A little help with walking and/or transfers;A little help with bathing/dressing/bathroom;Help with stairs or ramp for entrance   Equipment Recommendations  BSC/3in1    Recommendations for Other Services       Precautions / Restrictions Precautions Precautions: Fall Restrictions Weight Bearing Restrictions: No     Mobility  Bed Mobility Overal bed mobility: Needs Assistance Bed Mobility: Supine to Sit, Sit to Supine     Supine to sit: Min guard     General bed  mobility comments: cues for sequencing    Transfers Overall transfer level: Needs assistance Equipment used: 1 person hand held assist Transfers: Sit to/from Stand Sit to Stand: Min guard           General transfer comment: safe technique. Slow transfer    Ambulation/Gait Ambulation/Gait assistance: Min assist Gait Distance (Feet): 15 Feet Assistive device: 1 person hand held assist Gait Pattern/deviations: Step-to pattern       General Gait Details: ambulated around bed to recliner. +2 only secondary to equipment. 2L of O2 removed for mobility, unable to get accurate pulse ox testing, returned 2L of O2 after ambulation. No SOB symptoms noted   Stairs             Wheelchair Mobility    Modified Rankin (Stroke Patients Only)       Balance Overall balance assessment: Needs assistance Sitting-balance support: No upper extremity supported, Feet supported Sitting balance-Leahy Scale: Good     Standing balance support: Single extremity supported, During functional activity Standing balance-Leahy Scale: Fair                              Cognition Arousal/Alertness: Awake/alert Behavior During Therapy: WFL for tasks assessed/performed Overall Cognitive Status: Within Functional Limits for tasks assessed                                 General Comments: improved cognition this date with ability to follow commands        Exercises Other Exercises Other Exercises: seated ther-ex including LAQ, alt marching, and B  UE shoulder raises. 10 reps    General Comments        Pertinent Vitals/Pain Pain Assessment Pain Assessment: No/denies pain    Home Living                          Prior Function            PT Goals (current goals can now be found in the care plan section) Acute Rehab PT Goals Patient Stated Goal: to go home PT Goal Formulation: With patient Time For Goal Achievement: 07/09/22 Potential to Achieve  Goals: Fair Progress towards PT goals: Progressing toward goals    Frequency    Min 2X/week      PT Plan Discharge plan needs to be updated    Co-evaluation              AM-PAC PT "6 Clicks" Mobility   Outcome Measure  Help needed turning from your back to your side while in a flat bed without using bedrails?: A Little Help needed moving from lying on your back to sitting on the side of a flat bed without using bedrails?: A Little Help needed moving to and from a bed to a chair (including a wheelchair)?: A Little Help needed standing up from a chair using your arms (e.g., wheelchair or bedside chair)?: A Little Help needed to walk in hospital room?: A Little Help needed climbing 3-5 steps with a railing? : A Lot 6 Click Score: 17    End of Session Equipment Utilized During Treatment: Gait belt Activity Tolerance: Patient tolerated treatment well Patient left: in chair;with chair alarm set Nurse Communication: Mobility status PT Visit Diagnosis: Unsteadiness on feet (R26.81);Muscle weakness (generalized) (M62.81);Difficulty in walking, not elsewhere classified (R26.2)     Time: 6384-5364 PT Time Calculation (min) (ACUTE ONLY): 19 min  Charges:  $Gait Training: 8-22 mins                     [name], PT, DPT, GCS [phone]    [name] 06/30/2022, 9:58 AM

## 2022-06-30 NOTE — Care Management Important Message (Signed)
Important Message  Patient Details  Name: Carl Bell MRN: [id number] Date of Birth: Aug 26, 1950   Medicare Important Message Given:  Yes     Dannette Barbara 06/30/2022, 10:35 AM

## 2022-06-30 NOTE — Progress Notes (Signed)
Order received from Dr Lysle Pearl to change CBG's from Q4 to [facility] and HS

## 2022-06-30 NOTE — Progress Notes (Signed)
SATURATION QUALIFICATIONS: (This note is used to comply with regulatory documentation for home oxygen)  Patient Saturations on Room Air at Rest = 94%  Patient Saturations on Room Air while Ambulating = 88%  Patient Saturations on 2 Liters of oxygen while Ambulating = 94%  Please briefly explain why patient needs home oxygen:Patient de sats without oxygen when ambulating

## 2022-06-30 NOTE — Plan of Care (Signed)

## 2022-06-30 NOTE — TOC Progression Note (Signed)
Transition of Care [facility]) - Progression Note    Patient Details  Name: Carl Bell MRN: [id number] Date of Birth: 1950/11/25  [facility]) CM/SW Contact  Beverly Sessions, RN Phone Number: 06/30/2022, 4:25 PM  Clinical Narrative:      Received return call from daughter Carl Bell  Daughter confirms ok with patient returning to his mothers home - she states that she is not able to provide ostomy care in the home and that it would have to be his brother and sister. When I spoke to brother earlier this morning at bedside he was willing to learn   - Attempting to arrange a time when they are available for education   - called brother Carl Bell VM full  - VM left for sister Carl Bell   Expected Discharge Plan: [place] Barriers to Discharge: Continued Medical Work up  Expected Discharge Plan and Services Expected Discharge Plan: [place] Choice:  (TBD) Living arrangements for the past 2 months: Single Family Home                           HH Arranged: RN, PT, OT [facility] Agency: [place] Date [place]: 06/28/22 Time [place]: 1255 Representative spoke with at [place]: [place] ([place]) Interventions    Readmission Risk Interventions    06/24/2022   11:08 AM  Readmission Risk Prevention Plan  Transportation Screening Complete  PCP or Specialist Appt within 3-5 Days Complete  Social Work Consult for [place] Planning/Counseling Complete  Palliative Care Screening Not Applicable  Medication Review Press photographer) Complete

## 2022-06-30 NOTE — Progress Notes (Signed)
Subjective:  CC: Carl Bell is a 71 y.o. male  Hospital stay day 8, 7 Days Post-Op Hartman's procedure for sigmoid volvulus  HPI: No acute issues reported overnight ROS:  General: Denies weight loss, weight gain, fatigue, fevers, chills, and night sweats. Heart: Denies chest pain, palpitations, racing heart, irregular heartbeat, leg pain or swelling, and decreased activity tolerance. Respiratory: Denies breathing difficulty, shortness of breath, wheezing, cough, and sputum. GI: Denies change in appetite, heartburn, nausea, vomiting, constipation, diarrhea, and blood in stool. GU: Denies difficulty urinating, pain with urinating, urgency, frequency, blood in urine.   Objective:   Temp:  [97.9 F (36.6 C)-98.3 F (36.8 C)] 97.9 F (36.6 C) (12/18 1528) Pulse Rate:  [71-92] 71 (12/18 1528) Resp:  [15-18] 18 (12/18 1528) BP: (100-126)/(66-87) 112/83 (12/18 1528) SpO2:  [87 %-100 %] 87 % (12/18 0809)     Height: 6\' 1"  (185.4 cm) Weight: 54 kg BMI (Calculated): 15.71   Intake/Output this shift:   Intake/Output Summary (Last 24 hours) at 06/30/2022 1627 Last data filed at 06/30/2022 0649 Gross per 24 hour  Intake 484.87 ml  Output 150 ml  Net 334.87 ml    Constitutional :  alert, cooperative, appears stated age, and no distress  Respiratory:  clear to auscultation bilaterally  Cardiovascular:  regular rate and rhythm  Gastrointestinal: Soft, no guarding, ostomy intact  Skin: Cool and moist.  Staple line clean dry and intact  Psychiatric: Normal affect, non-agitated, not confused       LABS:     Latest Ref Rng & Units 06/26/2022   11:46 AM 06/25/2022    4:51 AM 06/24/2022    9:52 AM  CMP  Glucose 70 - 99 mg/dL 90  115  133   BUN 8 - 23 mg/dL 17  20  21    Creatinine 0.61 - 1.24 mg/dL 0.66  0.81  1.13   Sodium 135 - 145 mmol/L 138  141  143   Potassium 3.5 - 5.1 mmol/L 4.0  4.5  5.1   Chloride 98 - 111 mmol/L 107  111  111   CO2 22 - 32 mmol/L 26  28  21    Calcium  8.9 - 10.3 mg/dL 8.4  8.4  8.3       Latest Ref Rng & Units 06/26/2022   11:46 AM 06/25/2022    4:51 AM 06/24/2022    6:32 AM  CBC  WBC 4.0 - 10.5 K/uL 4.8  6.1  8.5   Hemoglobin 13.0 - 17.0 g/dL 10.4  9.9  9.2   Hematocrit 39.0 - 52.0 % 33.2  30.8  29.3   Platelets 150 - 400 K/uL 232  223  254     RADS: N/a Assessment:   Sigmoid volvulus, s/p decompression colonoscopy, subsequent Hartman's procedure  Stable.  Abdomen remains soft with no distention.  Some stool noted in the ostomy bag today.  On regular diet.  Staples removed at bedside no issues.  Disposition becoming an issue due to patient's intermittent confusion and ability due to confidently make medical decisions.  Family is involved but have some varying opinions.  For now, plan is patient to go home with home health and aide from patient's brother and sister for ostomy care.  Pending formal ostomy education of siblings by ostomy nurse at bedside prior to discharge.  labs/images/medications/previous chart entries reviewed personally and relevant changes/updates noted above.

## 2022-06-30 NOTE — Consult Note (Addendum)
[place] Nurse ostomy follow up Patient registered today with [employer] post op ostomy support and sample ostomy supply program. Supplies requested: 2 and 3/4 inch ostomy pouching system 4 pouches 4 skin barriers (Cera Plus) 4 skin barrier rings (cera Plus) 1 bottle stoma powder 1 medium belt  Requested that patient be assisted to order supplies from a downstream provider compatible with his insurance provider.  Additional supplies provided to bedside: 4 pouches Kellie Simmering # [phone]), 4 skin barriers Kellie Simmering # 2), 4 skin barrier rings Kellie Simmering # [phone]).  [place] nursing team will follow, will remain available to this patient, the nursing and medical teams.  Please re-consult if needed.  Thank you for inviting [place] to participate in this patient's Plan of Care.  Maudie Flakes, MSN, RN, CNS, [place], Serita Grammes, [employer], [employer] phone:  [phone]

## 2022-06-30 NOTE — TOC Progression Note (Signed)
Transition of Care [facility]) - Progression Note    Patient Details  Name: Carl Bell MRN: [id number] Date of Birth: [birth year]  Transition [facility]) CM/SW Contact  Carl Sessions, RN Phone Number: 06/30/2022, 11:35 AM  Clinical Narrative:     Received notification from bedside Rn that patients brother is in the room, and that patient is planning to return to his home with his mother  Per MD patient is not able to make his on medical decisions.   Brother says he would be willing to learn ostomy care, however he does not live with patient.  Patients mother is 95 and unable to assist with care   Discharge planing has been facilitated daughter Carl Bell :- 35 - called Carl Bell x2.  VM left x1 - 1059 - called Carl Bell x1 - [place] - called Carl Bell x1. VM left  - Called son Carl Bell that patient is supposed to be living with at discharge.  No answer VM full  - Brother Carl Bell was at bedside however has has left.  Called to schedule a time for ostomy training with wound nurse.  No answer. VM not set up  At this time there is not a confirmed discharge plan, and family has not received ostomy education.  Patient has extensive vision loss and would not be able to manage his ostomy on his own  - [facility] delivered to room.  Patient will need a portable tank of O2 at discharge.  Confirmed with Faythe Dingwall at [employer] that his home O2 is through them.  They will need updated sats and orders   Expected Discharge Plan: [place] Barriers to Discharge: Continued Medical Work up  Expected Discharge Plan and Services Expected Discharge Plan: [place] Choice:  (TBD) Living arrangements for the past 2 months: Single Family Home                           HH Arranged: RN, PT, OT [facility] Agency: [place] Date [place]: 06/28/22 Time [place]: 3500 Representative spoke with at  [place]: [place] ([place]) Interventions    Readmission Risk Interventions    06/24/2022   11:08 AM  Readmission Risk Prevention Plan  Transportation Screening Complete  PCP or Specialist Appt within 3-5 Days Complete  Social Work Consult for [place] Planning/Counseling Complete  Palliative Care Screening Not Applicable  Medication Review Press photographer) Complete

## 2022-07-01 LAB — GLUCOSE, CAPILLARY
Glucose-Capillary: 100 mg/dL — ABNORMAL HIGH (ref 70–99)
Glucose-Capillary: 127 mg/dL — ABNORMAL HIGH (ref 70–99)
Glucose-Capillary: 75 mg/dL (ref 70–99)
Glucose-Capillary: 88 mg/dL (ref 70–99)
Glucose-Capillary: 99 mg/dL (ref 70–99)

## 2022-07-01 MED ORDER — POLYETHYLENE GLYCOL 3350 17 G PO PACK: 17.0000 g | PACK | Freq: Every day | ORAL | 0 refills | Status: AC

## 2022-07-01 NOTE — Progress Notes (Signed)
Occupational Therapy Treatment Patient Details Name: Carl Bell MRN: [id number] DOB: [birth year] Today's Date: 07/01/2022   History of present illness Carl Bell is a 71 y.o. male with past medical history significant for low vision, COPD with chronic respiratory failure on 2 L of oxygen, hypertension, history of lung cancer status post chemoradiation therapy, history of liver cirrhosis and hypertension who presents to the ER for evaluation of a 3-day history of constipation associated with diffuse abdominal pain. S/p Hartman's procedure for sigmoid volvulus on 06/23/22.   OT comments  Mr Ines was seen for OT treatment on this date. Upon arrival to room pt reclined in bed, agreeable to tx. Pt requires SUPERVISION for bed mobility. MAX A don/doff B socks seated EOB. MIN A + RW sit<>stand, unable to rise without assist from elevated bed despite multiple attempts, requires assist for fully upright posture, citing back pain. Requests to sit, CGA squat pivot bed>chair t/f. Pt making good progress toward goals, will continue to follow POC. Discharge recommendation remains appropriate however if pt has 24/7 supervision may return home. Anticipate pt will perform better in familiar environment given low vision.    Recommendations for follow up therapy are one component of a multi-disciplinary discharge planning process, led by the attending physician.  Recommendations may be updated based on patient status, additional functional criteria and insurance authorization.    Follow Up Recommendations  Skilled nursing-short term rehab (<3 hours/day) (may d/c home with 24/7 supervision)     Assistance Recommended at Discharge Frequent or constant Supervision/Assistance  Patient can return home with the following  A little help with walking and/or transfers;A little help with bathing/dressing/bathroom;Help with stairs or ramp for entrance   Equipment Recommendations  BSC/3in1    Recommendations for  Other Services      Precautions / Restrictions Precautions Precautions: Fall Restrictions Weight Bearing Restrictions: No       Mobility Bed Mobility Overal bed mobility: Needs Assistance Bed Mobility: Supine to Sit, Sit to Supine, Rolling Rolling: Min guard   Supine to sit: Supervision Sit to supine: Min guard        Transfers Overall transfer level: Needs assistance Equipment used: Rolling walker (2 wheels) Transfers: Sit to/from Stand Sit to Stand: Min assist           General transfer comment: unable to rise from elevated bed despite multiple attempts, requires assist for fully upright posture, citing back pain     Balance Overall balance assessment: Needs assistance Sitting-balance support: No upper extremity supported, Feet supported Sitting balance-Leahy Scale: Good     Standing balance support: Single extremity supported, During functional activity Standing balance-Leahy Scale: Fair                             ADL either performed or assessed with clinical judgement   ADL Overall ADL's : Needs assistance/impaired                                       General ADL Comments: MAX A don/doff B socks seated EOB. MIN A + RW for simulated BSC t/f, limited by back pain      Cognition Arousal/Alertness: Awake/alert Behavior During Therapy: WFL for tasks assessed/performed Overall Cognitive Status: Within Functional Limits for tasks assessed Area of Impairment: Safety/judgement  Safety/Judgement: Decreased awareness of safety, Decreased awareness of deficits                    General Comments on 2L [place]    Pertinent Vitals/ Pain       Pain Assessment Pain Assessment: 0-10 Pain Score: 7  Pain Location: back Pain Descriptors / Indicators: Aching Pain Intervention(s): Limited activity within patient's tolerance, Repositioned, Patient requesting pain meds-RN notified, Premedicated before  session   Frequency  Min 2X/week        Progress Toward Goals  OT Goals(current goals can now be found in the care plan section)  Progress towards OT goals: Progressing toward goals  Acute Rehab OT Goals Patient Stated Goal: to go home OT Goal Formulation: With patient Time For Goal Achievement: 07/09/22 Potential to Achieve Goals: Good ADL Goals Pt Will Perform Grooming: with modified independence;standing Pt Will Perform Lower Body Dressing: Independently;sit to/from stand Pt Will Transfer to Toilet: with modified independence;ambulating;regular height toilet  Plan Discharge plan remains appropriate;Frequency remains appropriate (may d/c home with 24/7 supervision)    Co-evaluation                 AM-PAC OT "6 Clicks" Daily Activity     Outcome Measure   Help from another person eating meals?: None Help from another person taking care of personal grooming?: A Little Help from another person toileting, which includes using toliet, bedpan, or urinal?: A Little Help from another person bathing (including washing, rinsing, drying)?: A Little Help from another person to put on and taking off regular upper body clothing?: None Help from another person to put on and taking off regular lower body clothing?: A Little 6 Click Score: 20    End of Session    OT Visit Diagnosis: Other abnormalities of gait and mobility (R26.89);Muscle weakness (generalized) (M62.81)   Activity Tolerance Patient limited by pain;Patient tolerated treatment well   Patient Left     Nurse Communication Patient requests pain meds        Time: 1100-1125 OT Time Calculation (min): 25 min  Charges: OT General Charges $OT Visit: 1 Visit OT Treatments $Self Care/Home Management : 23-37 mins  [name], M.S. OTR/L  07/01/22, 12:48 PM  ascom [phone]

## 2022-07-01 NOTE — TOC Progression Note (Signed)
Transition of Care [facility]) - Progression Note    Patient Details  Name: Carl Bell MRN: [id number] Date of Birth: 04/27/1951  Transition of Care [facility]) CM/SW Contact  Beverly Sessions, RN Phone Number: 07/01/2022, 9:29 AM  Clinical Narrative:     Received call from sister pam after hours yesterday. She states that she had concerns patients mother has a doctor appointment on Wednesday and she may end up being admitted to the hospital .  If that is the case there would not be anyone at the after the patient discharges  I recommended that Gaspar Bidding ( brother) and sister [name]) set at time that they are both available to meet with the patient and discuss the disposition and ostomy educations    845 - called placed to [place] on home and cell.  VM left on home phone.  VM full on cell phone  930 - VM left for daughter [name] to let her update her that I have not been able to reach Gaspar Bidding to arrange education, and to discuss potential placement pending insurance approval    945 - received return call from daughter [name].  She states that Gaspar Bidding will need to be the one making decision moving forward regarding disposition  , placement, and care  Expected Discharge Plan: [place] Barriers to Discharge: Continued Medical Work up  Expected Discharge Plan and Services Expected Discharge Plan: [place] Choice:  (TBD) Living arrangements for the past 2 months: Single Family Home                           HH Arranged: RN, PT, OT [facility] Agency: [place] Date [place]: 06/28/22 Time [place]: 1255 Representative spoke with at [place]: [place] ([place]) Interventions    Readmission Risk Interventions    06/24/2022   11:08 AM  Readmission Risk Prevention Plan  Transportation Screening Complete  PCP or Specialist Appt within 3-5 Days Complete  Social Work  Consult for [place] Planning/Counseling Complete  Palliative Care Screening Not Applicable  Medication Review Press photographer) Complete

## 2022-07-01 NOTE — TOC Progression Note (Signed)
Transition of Care [facility]) - Progression Note    Patient Details  Name: Carl Bell MRN: [id number] Date of Birth: Dec 05, 1950  Transition of Care [facility]) CM/SW Contact  Carl Sessions, RN Phone Number: 07/01/2022, 3:57 PM  Clinical Narrative:     Met with patient, sister, brother, son, daughter in law, [place] RN, and MD at bedside.   Ostomy teaching today  - If patients mother is able to return home tomorrow after her MD appointment plan will be for patient to return home with home health services through [facility], outpatient palliative services, and brother and sister being educated on ostomy care  - SNF bed offers presented.  Family and patient decide to accept [facility].  Their first preference was [facility] however they currently have a covid out break so family opted to [facility].  - Spoke with Whitney at [employer] and she is to start insurance approval today as a back up plan if patients mother does not get to return home at discharge.   Family and patient confirm for brother Carl Bell to be the point of contact  - I have let them though to come up with an alternative plan in case mother does not get to go home and insurance denies SNF.  Son Carl Bell states that he is still willing for patient to stay with him. Patient states "ill think about it"  Expected Discharge Plan: [place] Barriers to Discharge: Continued Medical Work up  Expected Discharge Plan and Services Expected Discharge Plan: [place] Choice:  (TBD) Living arrangements for the past 2 months: Single Family Home                           HH Arranged: RN, PT, OT [facility] Agency: [place] Date [place]: 06/28/22 Time [place]: 5953 Representative spoke with at [place]: [place] ([place]) Interventions    Readmission Risk Interventions    06/24/2022   11:08 AM   Readmission Risk Prevention Plan  Transportation Screening Complete  PCP or Specialist Appt within 3-5 Days Complete  Social Work Consult for [place] Planning/Counseling Complete  Palliative Care Screening Not Applicable  Medication Review Press photographer) Complete

## 2022-07-01 NOTE — Consult Note (Signed)
WOC Nurse ostomy follow up Stoma type/location: Extended session for family teaching. Four (4) family members in for session (Son, daughter-in-law, brother, sister). Stomal assessment/size: 1 and 1/8 inch round with small piece of sloughing mucosa still adhering to stoma Peristomal assessment: mild mucocutaneous separation Treatment options for stomal/peristomal skin: skin barrier ring Output: brown stool  Ostomy pouching: 2pc. 2 and 3/4 inch pouching system with skin barrier ring. Pouch is Kellie Simmering # [phone] skin barrier is Kellie Simmering #2 and skin barrier ring is Kellie Simmering # [phone] Education provided:   Explained role of ostomy nurse and creation of stoma  Explained stoma characteristics (budded, flush, color, texture, care) Demonstrated pouch change (cutting new skin barrier, measuring stoma, cleaning peristomal skin and stoma, use of barrier ring). Son and daughter-in-law cut out skin barrier according to pattern, I have to enlarge pattern slightly. Education on emptying when 1/3 to 1/2 full and how to empty. Family taught to empty pouch, clean bottom 2 inches of tail closure and reseal the Lock and Roll closure. Two of the 4 family members practice using the closure mechanism and can do so independently. All are shown how to clean the bottom of the pouch with a toilet paper "wick". Demonstrated use of wick to clean spout  Discussed bathing, diet, gas, medication use, constipation  Answered patient/family questions. Time of session = 75 minutes inclusive of documentation.  Supplies provided for discharge: 5 pouches, 5 skin barrier rings, 5 skin barriers, odor eliminator spray, stoma measuring guides (2), education folder, four (4) copies of the 1-page tip sheet for changing and ostomy pouch.  Patient's family has already received the [employer] welcome package at their home with 4 pouches, 4 skin barriers and 4 skin barrier rings enclosed. They have enough for 9 pouch changes, or for 4.5 weeks. [employer] representative will assist patient's family in obtaining additional supplies.   Enrolled patient in [place] Start Discharge program: Yes  [place] nursing team will follow, and will remain available to this patient, the nursing and medical teams.    Thank you for inviting [place] to participate in this patient's Plan of Care.  Maudie Flakes, MSN, RN, CNS, [place], Serita Grammes, [employer], [employer] phone:  [phone]

## 2022-07-01 NOTE — Progress Notes (Signed)
Subjective:  CC: Carl Bell is a 71 y.o. male  Hospital stay day 9, 8 Days Post-Op Hartman's procedure for sigmoid volvulus  HPI: No acute issues reported overnight.  Family here for ostomy teaching  ROS:  General: Denies weight loss, weight gain, fatigue, fevers, chills, and night sweats. Heart: Denies chest pain, palpitations, racing heart, irregular heartbeat, leg pain or swelling, and decreased activity tolerance. Respiratory: Denies breathing difficulty, shortness of breath, wheezing, cough, and sputum. GI: Denies change in appetite, heartburn, nausea, vomiting, constipation, diarrhea, and blood in stool. GU: Denies difficulty urinating, pain with urinating, urgency, frequency, blood in urine.   Objective:   Temp:  [97.3 F (36.3 C)-97.8 F (36.6 C)] 97.5 F (36.4 C) (12/19 1609) Pulse Rate:  [67-80] 67 (12/19 1609) Resp:  [16-20] 16 (12/19 1609) BP: (98-120)/(64-80) 112/72 (12/19 1609) SpO2:  [75 %-100 %] 93 % (12/19 1609)     Height: 6\' 1"  (185.4 cm) Weight: 54 kg BMI (Calculated): 15.71   Intake/Output this shift:   Intake/Output Summary (Last 24 hours) at 07/01/2022 1756 Last data filed at 06/30/2022 2150 Gross per 24 hour  Intake 118 ml  Output 0 ml  Net 118 ml    Constitutional :  alert, cooperative, appears stated age, and no distress  Respiratory:  clear to auscultation bilaterally  Cardiovascular:  regular rate and rhythm  Gastrointestinal: Soft, no guarding, ostomy intact  Skin: Cool and moist.  Staple line clean dry and intact  Psychiatric: Normal affect, non-agitated, not confused       LABS:     Latest Ref Rng & Units 06/26/2022   11:46 AM 06/25/2022    4:51 AM 06/24/2022    9:52 AM  CMP  Glucose 70 - 99 mg/dL 90  115  133   BUN 8 - 23 mg/dL 17  20  21    Creatinine 0.61 - 1.24 mg/dL 0.66  0.81  1.13   Sodium 135 - 145 mmol/L 138  141  143   Potassium 3.5 - 5.1 mmol/L 4.0  4.5  5.1   Chloride 98 - 111 mmol/L 107  111  111   CO2 22 - 32  mmol/L 26  28  21    Calcium 8.9 - 10.3 mg/dL 8.4  8.4  8.3       Latest Ref Rng & Units 06/26/2022   11:46 AM 06/25/2022    4:51 AM 06/24/2022    6:32 AM  CBC  WBC 4.0 - 10.5 K/uL 4.8  6.1  8.5   Hemoglobin 13.0 - 17.0 g/dL 10.4  9.9  9.2   Hematocrit 39.0 - 52.0 % 33.2  30.8  29.3   Platelets 150 - 400 K/uL 232  223  254     RADS: N/a Assessment:   Sigmoid volvulus, s/p decompression colonoscopy, subsequent Hartman's procedure  Stable.  Abdomen remains soft with no distention.  Some stool noted in the ostomy bag today.  On regular diet.    Pending dispo per family  labs/images/medications/previous chart entries reviewed personally and relevant changes/updates noted above.

## 2022-07-01 NOTE — Progress Notes (Signed)
Physical Therapy Treatment Patient Details Name: Carl Bell MRN: [id number] DOB: 12-27-1950 Today's Date: 07/01/2022   History of Present Illness Carl Bell is a 71 y.o. male with past medical history significant for low vision, COPD with chronic respiratory failure on 2 L of oxygen, hypertension, history of lung cancer status post chemoradiation therapy, history of liver cirrhosis and hypertension who presents to the ER for evaluation of a 3-day history of constipation associated with diffuse abdominal pain. S/p Hartman's procedure for sigmoid volvulus on 06/23/22.    PT Comments    Pt initially not wishing to work with PT citing back pain, but with some cuing and encouragement he does acknowledge that he needs to be up and moving.  Ultimately he did agree and showed good motivation.  Pt able to get to sitting EOB w/o direct assist, light assist to initiate standing with increased back pain but with some weight transfer through arms onto walker and slowly increasing upright posture he did feel better and showed great effort with a few small loops of ambulation in the room.  Plenty of directional cuing due to his blindness, but he did well with ~50 ft of slow but steady ambulation.   Recommendations for follow up therapy are one component of a multi-disciplinary discharge planning process, led by the attending physician.  Recommendations may be updated based on patient status, additional functional criteria and insurance authorization.  Follow Up Recommendations  Home health PT Can patient physically be transported by private vehicle: Yes   Assistance Recommended at Discharge Frequent or constant Supervision/Assistance  Patient can return home with the following A little help with walking and/or transfers;A little help with bathing/dressing/bathroom;Help with stairs or ramp for entrance   Equipment Recommendations       Recommendations for Other Services       Precautions /  Restrictions Precautions Precautions: Fall Restrictions Weight Bearing Restrictions: No     Mobility  Bed Mobility Overal bed mobility: Needs Assistance Bed Mobility: Supine to Sit     Supine to sit: Supervision          Transfers Overall transfer level: Needs assistance Equipment used: Rolling walker (2 wheels) Transfers: Sit to/from Stand Sit to Stand: Min assist, From elevated surface           General transfer comment: minimally elevated bed, but initially hesitant, but with cuing and some assist to initiate standing he was able to rise w/o heavy assist.    Ambulation/Gait Ambulation/Gait assistance: Min assist Gait Distance (Feet): 50 Feet Assistive device: Rolling walker (2 wheels)         General Gait Details: Pt with slow, cautious gait.  Needed consistent directional cuing due to blindness, but pt was able to maintain balance and on brief straigh-a-ways did manage a shuffling but consistent cadence.  Pt did have some fatigue with the effort, but back pain actually eased with change of position and ability to put some pressure through walker and stand near upright.   Stairs             Wheelchair Mobility    Modified Rankin (Stroke Patients Only)       Balance Overall balance assessment: Needs assistance Sitting-balance support: No upper extremity supported, Feet supported Sitting balance-Leahy Scale: Good     Standing balance support: Bilateral upper extremity supported Standing balance-Leahy Scale: Good Standing balance comment: good static balance in walker, fair static standing w/o UE support  Cognition Arousal/Alertness: Awake/alert Behavior During Therapy: WFL for tasks assessed/performed Overall Cognitive Status: Within Functional Limits for tasks assessed Area of Impairment: Safety/judgement                                        Exercises      General Comments         Pertinent Vitals/Pain Pain Assessment Pain Score: 6  Pain Location: low back    Home Living                          Prior Function            PT Goals (current goals can now be found in the care plan section) Progress towards PT goals: Progressing toward goals    Frequency    Min 2X/week      PT Plan Current plan remains appropriate    Co-evaluation              AM-PAC PT "6 Clicks" Mobility   Outcome Measure  Help needed turning from your back to your side while in a flat bed without using bedrails?: A Little Help needed moving from lying on your back to sitting on the side of a flat bed without using bedrails?: A Little Help needed moving to and from a bed to a chair (including a wheelchair)?: A Little Help needed standing up from a chair using your arms (e.g., wheelchair or bedside chair)?: A Little Help needed to walk in hospital room?: A Little Help needed climbing 3-5 steps with a railing? : A Lot 6 Click Score: 17    End of Session Equipment Utilized During Treatment: Gait belt Activity Tolerance: Patient tolerated treatment well Patient left: in chair;with chair alarm set;with call bell/phone within reach   PT Visit Diagnosis: Unsteadiness on feet (R26.81);Muscle weakness (generalized) (M62.81);Difficulty in walking, not elsewhere classified (R26.2)     Time: 3428-7681 PT Time Calculation (min) (ACUTE ONLY): 17 min  Charges:  $Gait Training: 8-22 mins                     Kreg Shropshire, DPT 07/01/2022, 5:39 PM

## 2022-07-02 LAB — GLUCOSE, CAPILLARY
Glucose-Capillary: 100 mg/dL — ABNORMAL HIGH (ref 70–99)
Glucose-Capillary: 99 mg/dL (ref 70–99)
Glucose-Capillary: 133 mg/dL — ABNORMAL HIGH (ref 70–99)
Glucose-Capillary: 111 mg/dL — ABNORMAL HIGH (ref 70–99)

## 2022-07-02 NOTE — Plan of Care (Signed)

## 2022-07-02 NOTE — Progress Notes (Signed)
Nutrition Follow Up Note   DOCUMENTATION CODES:   Severe malnutrition in context of chronic illness  INTERVENTION:   Ensure Enlive po TID, each supplement provides 350 kcal and 20 grams of protein.  Magic cup TID with meals, each supplement provides 290 kcal and 9 grams of protein  MVI po daily   Pt at high refeed risk; recommend monitor potassium, magnesium and phosphorus labs daily until stable  Daily weights   NUTRITION DIAGNOSIS:   Severe Malnutrition related to chronic illness as evidenced by severe fat depletion, severe muscle depletion, 27 percent weight loss in 9 months.  GOAL:   Patient will meet greater than or equal to 90% of their needs - not met   MONITOR:   PO intake, Supplement acceptance, Labs, Weight trends, Skin, I & O's  ASSESSMENT:   71 y.o. 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  Pt with intermittent poor appetite and oral intake since admission. Pt eating mainly sips and bites of meals but is documented to have eaten 75% of a couple of meals with assist. Pt is refusing the Ensure supplements. Per chart, pt appears weight stable since admission. Plan is for SNF with transition back to hospice. Insurance authorization is pending.   Medications reviewed and include: lovenox, MVI, miralax  Labs reviewed: cbgs- 99, 100 x 24 hrs  Diet Order:   Diet Order             Diet regular Room service appropriate? Yes; Fluid consistency: Thin  Diet effective now                  EDUCATION NEEDS:   Education needs have been addressed  Skin:  Skin Assessment: Reviewed RN Assessment (incision abdomen)  Last BM:  12/20- small amount via ostomy  Height:   Ht Readings from Last 1 Encounters:  06/23/22 6' 1" (1.854 m)    Weight:   Wt Readings  from Last 1 Encounters:  07/02/22 54.7 kg    Ideal Body Weight:  83.6 kg  BMI:  Body mass index is 15.91 kg/m.  Estimated Nutritional Needs:   Kcal:  1700-2000kcal/day  Protein:  85-100g/day  Fluid:  1.4-1.6L/day  Koleen Distance MS, RD, LDN Please refer to [facility] for RD and/or RD on-call/weekend/after hours pager

## 2022-07-02 NOTE — TOC Progression Note (Signed)
Transition of Care [facility]) - Progression Note    Patient Details  Name: Carl Bell MRN: [id number] Date of Birth: 10-27-1950  Transition [facility] Care [facility]) CM/SW Contact  Beverly Sessions, RN Phone Number: 07/02/2022, 9:54 AM  Clinical Narrative:      Insurance auth pending for SNF for [facility]   Awaiting call from patient family to determine if after patients mothers MD visit today is she is going to be able to return home.  This will determine if patient will be able to return to his home or not    Barriers to Discharge: Continued Medical Work up  Expected Discharge Plan and [place] Choice:  (TBD) Living arrangements for the past 2 months: Single Family Home                           HH Arranged: RN, PT, OT [facility] Agency: [place] Date [place]: 06/28/22 Time [place]: 1255 Representative spoke with at [place]: [place] Determinants of Health ([place]) Interventions [place]: No Food Insecurity (06/30/2022)  Housing: Low Risk  (06/30/2022)  Transportation Needs: No Transportation Needs (06/30/2022)  Utilities: Not At Risk (06/30/2022)  Tobacco Use: High Risk (06/24/2022)    Readmission Risk Interventions    06/24/2022   11:08 AM  Readmission Risk Prevention Plan  Transportation Screening Complete  PCP or Specialist Appt within 3-5 Days Complete  Social Work Consult for [place] Planning/Counseling [place] Not Applicable  Medication Review Press photographer) Complete

## 2022-07-03 LAB — GLUCOSE, CAPILLARY
Glucose-Capillary: 89 mg/dL (ref 70–99)
Glucose-Capillary: 142 mg/dL — ABNORMAL HIGH (ref 70–99)
Glucose-Capillary: 102 mg/dL — ABNORMAL HIGH (ref 70–99)

## 2022-07-03 NOTE — TOC Progression Note (Signed)
Transition of Care [facility]) - Progression Note    Patient Details  Name: Carl Bell MRN: [id number] Date of Birth: 1950/10/12  Transition [facility]) CM/SW Contact  Beverly Sessions, RN Phone Number: 07/03/2022, 9:28 AM  Clinical Narrative:      Spoke with daughter [name].  She states that patient's mother will be non weightbearing and will not be able to assist with care if patient were to return home.    Plan to proceed with SNF pending insurance auth.  Spoke with Whitney at [employer] who said Josem Kaufmann is still pending.  MD updated    Barriers to Discharge: Continued Medical Work up  Expected Discharge Plan and [place] Choice:  (TBD) Living arrangements for the past 2 months: Single Family Home                           HH Arranged: RN, PT, OT [facility] Agency: [place] Date [place]: 06/28/22 Time [place]: [id number] Representative spoke with at [place]: [place] Determinants of Health ([place]) Interventions [place]: No Food Insecurity (06/30/2022)  Housing: Low Risk  (06/30/2022)  Transportation Needs: No Transportation Needs (06/30/2022)  Utilities: Not At Risk (06/30/2022)  Tobacco Use: High Risk (06/24/2022)    Readmission Risk Interventions    06/24/2022   11:08 AM  Readmission Risk Prevention Plan  Transportation Screening Complete  PCP or Specialist Appt within 3-5 Days Complete  Social Work Consult for [place] Planning/Counseling [place] Not Applicable  Medication Review Press photographer) Complete

## 2022-07-03 NOTE — Progress Notes (Signed)
Subjective:  CC: Carl Bell is a 71 y.o. male  Hospital stay day 11, 10 Days Post-Op Hartman's procedure for sigmoid volvulus  HPI: No acute issues reported overnight. No complaints  ROS:  General: Denies weight loss, weight gain, fatigue, fevers, chills, and night sweats. Heart: Denies chest pain, palpitations, racing heart, irregular heartbeat, leg pain or swelling, and decreased activity tolerance. Respiratory: Denies breathing difficulty, shortness of breath, wheezing, cough, and sputum. GI: Denies change in appetite, heartburn, nausea, vomiting, constipation, diarrhea, and blood in stool. GU: Denies difficulty urinating, pain with urinating, urgency, frequency, blood in urine.   Objective:   Temp:  [97.5 F (36.4 C)-98.4 F (36.9 C)] 98.4 F (36.9 C) (12/21 1559) Pulse Rate:  [80-86] 81 (12/21 1559) Resp:  [12-20] 12 (12/21 1559) BP: (107-132)/(72-94) 129/94 (12/21 1559) SpO2:  [98 %-100 %] 100 % (12/21 0418)     Height: 6\' 1"  (185.4 cm) Weight: 54.7 kg BMI (Calculated): 15.91   Intake/Output this shift:   Intake/Output Summary (Last 24 hours) at 07/03/2022 1746 Last data filed at 07/03/2022 1055 Gross per 24 hour  Intake 300 ml  Output --  Net 300 ml    Constitutional :  alert, cooperative, appears stated age, and no distress  Respiratory:  clear to auscultation bilaterally  Cardiovascular:  regular rate and rhythm  Gastrointestinal: Soft, no guarding, ostomy intact, productive  Skin: Cool and moist.  incision clean dry and intact  Psychiatric: Normal affect, non-agitated, not confused       LABS:     Latest Ref Rng & Units 06/26/2022   11:46 AM 06/25/2022    4:51 AM 06/24/2022    9:52 AM  CMP  Glucose 70 - 99 mg/dL 90  115  133   BUN 8 - 23 mg/dL 17  20  21    Creatinine 0.61 - 1.24 mg/dL 0.66  0.81  1.13   Sodium 135 - 145 mmol/L 138  141  143   Potassium 3.5 - 5.1 mmol/L 4.0  4.5  5.1   Chloride 98 - 111 mmol/L 107  111  111   CO2 22 - 32 mmol/L 26   28  21    Calcium 8.9 - 10.3 mg/dL 8.4  8.4  8.3       Latest Ref Rng & Units 06/26/2022   11:46 AM 06/25/2022    4:51 AM 06/24/2022    6:32 AM  CBC  WBC 4.0 - 10.5 K/uL 4.8  6.1  8.5   Hemoglobin 13.0 - 17.0 g/dL 10.4  9.9  9.2   Hematocrit 39.0 - 52.0 % 33.2  30.8  29.3   Platelets 150 - 400 K/uL 232  223  254     RADS: N/a Assessment:   Sigmoid volvulus, s/p decompression colonoscopy, subsequent Hartman's procedure  Stable.  Abdomen remains soft with no distention.  Some stool noted in the ostomy bag today.  On regular diet.    Family requesting SNF.  Currently waiting authorization.    labs/images/medications/previous chart entries reviewed personally and relevant changes/updates noted above.

## 2022-07-03 NOTE — TOC Progression Note (Signed)
Transition of Care [facility]) - Progression Note    Patient Details  Name: Carl Bell MRN: [id number] Date of Birth: 1951-06-14  Transition of Care [facility]) CM/SW Contact  Beverly Sessions, RN Phone Number: 07/03/2022, 4:06 PM  Clinical Narrative:     Notified by [name] from [facility] that [name] has denied auth due to patient being "active with hospice" . Per [name] did not provide a number to file and appeal.  They notified her that once patient had revoked services they could restart auth  I faxed her documentation from [employer] on 12/10 that revocation paper work has been completed.  She states they will restart auth      Barriers to Discharge: Continued Medical Work up  Expected Discharge Plan and Services     Post Acute Care Choice:  (TBD) Living arrangements for the past 2 months: Single Family Home                           HH Arranged: RN, PT, OT [facility] Agency: [place] Date [place]: 06/28/22 Time [place]: 1255 Representative spoke with at [place]: [place] Determinants of Health ([place]) Interventions [place]: No Food Insecurity (06/30/2022)  Housing: Low Risk  (06/30/2022)  Transportation Needs: No Transportation Needs (06/30/2022)  Utilities: Not At Risk (06/30/2022)  Tobacco Use: High Risk (06/24/2022)    Readmission Risk Interventions    06/24/2022   11:08 AM  Readmission Risk Prevention Plan  Transportation Screening Complete  PCP or Specialist Appt within 3-5 Days Complete  Social Work Consult for [place] Planning/Counseling [place] Not Applicable  Medication Review Press photographer) Complete

## 2022-07-03 NOTE — Progress Notes (Signed)
PT Cancellation Note  Patient Details Name: Carl Bell MRN: [id number] DOB: 1951/05/10   Cancelled Treatment:    Reason Eval/Treat Not Completed: Patient declined, no reason specified Pt remembers this PT from out prior session.  Encouraged getting up and doing some walking and he said "I don't feel like it."  When further encouraged he reports "I'm sore, maybe tomorrow."  Further encouragement to at least get to the chair or do a little activity were unsuccessful.  Will maintain on PT caseload and continue to encourage mobility and exercise.  Kreg Shropshire, DPT 07/03/2022, 4:51 PM

## 2022-07-04 LAB — GLUCOSE, CAPILLARY
Glucose-Capillary: 78 mg/dL (ref 70–99)
Glucose-Capillary: 80 mg/dL (ref 70–99)
Glucose-Capillary: 165 mg/dL — ABNORMAL HIGH (ref 70–99)

## 2022-07-04 NOTE — Progress Notes (Signed)
Mobility Specialist - Progress Note   07/04/22 1009  Mobility  Activity Transferred from bed to chair  Level of Assistance Minimal assist, patient does 75% or more  Assistive Device Front wheel walker  Distance Ambulated (ft) 4 ft  Activity Response Tolerated well  Mobility Referral Yes  $Mobility charge 1 Mobility   Pt supine upon entry, utilizing RA. Pt denied ambulation in the hallway, however willing to sit in the recliner. Pt completed bed mob ModI, requiring extra time to get EOB. Pt required MinA for STS to RW, Mod VC for hand placement and sequencing due to poor eye sight. Pt ambulated to the recliner CGA, poor standing balance to this date. Pt left sitting in recliner with alarm set and needs within reach. RN present at bedside upon author exit.  Candie [place] Mobility Specialist 07/04/22 10:19 AM

## 2022-07-04 NOTE — Progress Notes (Signed)
Mobility Specialist - Progress Note   07/04/22 1009  Mobility  Activity Transferred from bed to chair  Level of Assistance Minimal assist, patient does 75% or more  Assistive Device Front wheel walker  Distance Ambulated (ft) 4 ft  Activity Response Tolerated well  Mobility Referral Yes  $Mobility charge 1 Mobility   Pt supine upon entry, utilizing RA. Pt denied ambulating in the hallway, however willing to sit in the recliner. Pt completed bed mob ModI to the EOB, requiring MinA for ST

## 2022-07-04 NOTE — Progress Notes (Signed)
PT Cancellation Note  Patient Details Name: Carl Bell MRN: [id number] DOB: 11-29-1950   Cancelled Treatment:     Pt worked with Mobility Specialist earlier to mobilize out of bed to the chair. Upon this therapist's arrival pt declined any activity stating he just needed a cup of coffee. Pt assisted with blankets and nursing notified. Will re-attempt next available date/time.   Josie Dixon 07/04/2022, 3:26 PM

## 2022-07-04 NOTE — Progress Notes (Signed)
Occupational Therapy Treatment Patient Details Name: Carl Bell MRN: [id number] DOB: Dec 06, 1950 Today's Date: 07/04/2022   History of present illness Carl Bell is a 71 y.o. male with past medical history significant for low vision, COPD with chronic respiratory failure on 2 L of oxygen, hypertension, history of lung cancer status post chemoradiation therapy, history of liver cirrhosis and hypertension who presents to the ER for evaluation of a 3-day history of constipation associated with diffuse abdominal pain. S/p Hartman's procedure for sigmoid volvulus on 06/23/22.   OT comments  Mr Mentink was seen for OT treatment on this date. Upon arrival to room pt seated in chair, agreeable to tx. Pt requires MIN A sit<>stand, poor tolerance. SETUP + SUPERVISION face washing seated in chair. NT notified pt removed purewick. Pt making progress toward goals, will continue to follow POC. Discharge recommendation remains appropriate as pt will not have supervision at home on d/c per chart.     Recommendations for follow up therapy are one component of a multi-disciplinary discharge planning process, led by the attending physician.  Recommendations may be updated based on patient status, additional functional criteria and insurance authorization.    Follow Up Recommendations  Skilled nursing-short term rehab (<3 hours/day)     Assistance Recommended at Discharge Frequent or constant Supervision/Assistance  Patient can return home with the following  A little help with walking and/or transfers;A little help with bathing/dressing/bathroom;Help with stairs or ramp for entrance   Equipment Recommendations  BSC/3in1    Recommendations for Other Services      Precautions / Restrictions Precautions Precautions: Fall Restrictions Weight Bearing Restrictions: No       Mobility Bed Mobility               General bed mobility comments: recevied in chair    Transfers Overall transfer  level: Needs assistance Equipment used: None Transfers: Sit to/from Stand Sit to Stand: Min assist           General transfer comment: multiple attempts to rise w/o assist, requires MIN A and poor tolerance     Balance Overall balance assessment: Needs assistance Sitting-balance support: No upper extremity supported, Feet supported Sitting balance-Leahy Scale: Good     Standing balance support: Bilateral upper extremity supported                               ADL either performed or assessed with clinical judgement   ADL Overall ADL's : Needs assistance/impaired                                       General ADL Comments: MIN A  simulated BSC t/f. SETUP pericare sitting.      Cognition Arousal/Alertness: Awake/alert Behavior During Therapy: WFL for tasks assessed/performed Overall Cognitive Status: Within Functional Limits for tasks assessed                                                     Pertinent Vitals/ Pain       Pain Assessment Pain Assessment: No/denies pain   Frequency  Min 2X/week        Progress Toward Goals  OT Goals(current goals can now be found  in the care plan section)  Progress towards OT goals: Progressing toward goals  Acute Rehab OT Goals Patient Stated Goal: to go home OT Goal Formulation: With patient Time For Goal Achievement: 07/09/22 Potential to Achieve Goals: Good ADL Goals Pt Will Perform Grooming: with modified independence;standing Pt Will Perform Lower Body Dressing: Independently;sit to/from stand Pt Will Transfer to Toilet: with modified independence;ambulating;regular height toilet  Plan Discharge plan remains appropriate;Frequency remains appropriate    Co-evaluation                 AM-PAC OT "6 Clicks" Daily Activity     Outcome Measure   Help from another person eating meals?: None Help from another person taking care of personal grooming?: A Lot Help  from another person toileting, which includes using toliet, bedpan, or urinal?: A Little Help from another person bathing (including washing, rinsing, drying)?: A Lot Help from another person to put on and taking off regular upper body clothing?: None Help from another person to put on and taking off regular lower body clothing?: A Lot 6 Click Score: 17    End of Session    OT Visit Diagnosis: Other abnormalities of gait and mobility (R26.89);Muscle weakness (generalized) (M62.81)   Activity Tolerance Patient tolerated treatment well   Patient Left in chair;with call bell/phone within reach;with chair alarm set   Nurse Communication          Time: 1440-1455 OT Time Calculation (min): 15 min  Charges: OT General Charges $OT Visit: 1 Visit OT Treatments $Self Care/Home Management : 8-22 mins  [name], M.S. OTR/L  07/04/22, 3:40 PM  ascom [phone]

## 2022-07-04 NOTE — Consult Note (Addendum)
[place] Nurse ostomy follow up Stoma type/location: LLQ end colostomy Stomal assessment/size: 1 and 1/8 inch stoma, red, moist and slightly budded. Small piece of sloughing mucosa adhering to stoma Peristomal assessment: mild mucocutaneous separation Treatment options for stomal/peristomal skin: skin barrier ring Output: Brown stool Ostomy pouching: 2pc. 2 and 3/4 inch pouching system with skin barrier ring Education provided: None Enrolled patient in [place] Start Discharge program: Yes  Next planned visit is Tuesday, 12/26. Supplies ( 3 pouching set-up) are at bedside for Nursing to use if a pouch change is needed before next visit.   [place] nursing team will follow, will remain available to this patient, the nursing and medical teams.    Thank you for inviting [place] to participate in this patient's Plan of Care.  Maudie Flakes, MSN, RN, CNS, [place], Serita Grammes, [employer], [employer] phone:  [phone]

## 2022-07-04 NOTE — TOC Progression Note (Signed)
Transition of Care [facility]) - Progression Note    Patient Details  Name: Carl Bell MRN: [id number] Date of Birth: 1951/03/05  Transition of Care [facility]) CM/SW [place], LCSW Phone Number: 07/04/2022, 9:26 AM  Clinical Narrative:    Called and spoke with Altha Harm in Admissions at [facility]. She confirmed they have sent the Hospice Revocation letter to insurance and restarted auth. She and Whitney agreed to call CSW if they get auth over the weekend.   Expected Discharge Plan: [place] Barriers to Discharge: Continued Medical Work up  Expected Discharge Plan and Services     Post Acute Care Choice:  (TBD) Living arrangements for the past 2 months: Single Family Home                           HH Arranged: RN, PT, OT [facility] Agency: [place] Date [place]: 06/28/22 Time [place]: 3202 Representative spoke with at [place]: [place] Determinants of Health ([place]) Interventions [place]: No Food Insecurity (06/30/2022)  Housing: Low Risk  (06/30/2022)  Transportation Needs: No Transportation Needs (06/30/2022)  Utilities: Not At Risk (06/30/2022)  Tobacco Use: High Risk (06/24/2022)    Readmission Risk Interventions    06/24/2022   11:08 AM  Readmission Risk Prevention Plan  Transportation Screening Complete  PCP or Specialist Appt within 3-5 Days Complete  Social Work Consult for [place] Planning/Counseling [place] Not Applicable  Medication Review Press photographer) Complete

## 2022-07-05 LAB — GLUCOSE, CAPILLARY
Glucose-Capillary: 73 mg/dL (ref 70–99)
Glucose-Capillary: 110 mg/dL — ABNORMAL HIGH (ref 70–99)
Glucose-Capillary: 89 mg/dL (ref 70–99)
Glucose-Capillary: 83 mg/dL (ref 70–99)

## 2022-07-05 NOTE — Progress Notes (Signed)
Physical Therapy Treatment Patient Details Name: Carl Bell MRN: [id number] DOB: February 01, 1951 Today's Date: 07/05/2022   History of Present Illness Carl Bell is a 71 y.o. male with past medical history significant for low vision, COPD with chronic respiratory failure on 2 L of oxygen, hypertension, history of lung cancer status post chemoradiation therapy, history of liver cirrhosis and hypertension who presents to the ER for evaluation of a 3-day history of constipation associated with diffuse abdominal pain. S/p Hartman's procedure for sigmoid volvulus on 06/23/22.    PT Comments    Pt sleeping upon arrival, however easily awakens and is agreeable to sitting in recliner. Bed linens soiled, messaged RN to address while pt up in recliner. Pt declines to perform further activity at this time. Will continue to progress as able.    Recommendations for follow up therapy are one component of a multi-disciplinary discharge planning process, led by the attending physician.  Recommendations may be updated based on patient status, additional functional criteria and insurance authorization.  Follow Up Recommendations  Home health PT Can patient physically be transported by private vehicle: Yes   Assistance Recommended at Discharge Frequent or constant Supervision/Assistance  Patient can return home with the following A little help with walking and/or transfers;A little help with bathing/dressing/bathroom;Help with stairs or ramp for entrance   Equipment Recommendations  BSC/3in1    Recommendations for Other Services       Precautions / Restrictions Precautions Precautions: Fall Restrictions Weight Bearing Restrictions: No     Mobility  Bed Mobility Overal bed mobility: Needs Assistance Bed Mobility: Supine to Sit     Supine to sit: Supervision     General bed mobility comments: safe technique    Transfers Overall transfer level: Needs assistance Equipment used:  None Transfers: Sit to/from Stand Sit to Stand: Min assist           General transfer comment: able to stand from bed with min assist and SPT to recliner    Ambulation/Gait                   Stairs             Wheelchair Mobility    Modified Rankin (Stroke Patients Only)       Balance Overall balance assessment: Needs assistance Sitting-balance support: No upper extremity supported, Feet supported Sitting balance-Leahy Scale: Good     Standing balance support: Bilateral upper extremity supported Standing balance-Leahy Scale: Good                              Cognition Arousal/Alertness: Awake/alert Behavior During Therapy: WFL for tasks assessed/performed Overall Cognitive Status: Within Functional Limits for tasks assessed                                          Exercises      General Comments        Pertinent Vitals/Pain Pain Assessment Pain Assessment: No/denies pain    Home Living                          Prior Function            PT Goals (current goals can now be found in the care plan section) Acute Rehab PT Goals Patient Stated Goal: to  go home PT Goal Formulation: With patient Time For Goal Achievement: 07/09/22 Potential to Achieve Goals: Fair Progress towards PT goals: Progressing toward goals    Frequency    Min 2X/week      PT Plan Current plan remains appropriate    Co-evaluation              AM-PAC PT "6 Clicks" Mobility   Outcome Measure  Help needed turning from your back to your side while in a flat bed without using bedrails?: A Little Help needed moving from lying on your back to sitting on the side of a flat bed without using bedrails?: A Little Help needed moving to and from a bed to a chair (including a wheelchair)?: A Little Help needed standing up from a chair using your arms (e.g., wheelchair or bedside chair)?: A Little Help needed to walk in  hospital room?: A Little Help needed climbing 3-5 steps with a railing? : A Lot 6 Click Score: 17    End of Session   Activity Tolerance: Patient tolerated treatment well Patient left: in chair;with chair alarm set;with call bell/phone within reach Nurse Communication: Mobility status PT Visit Diagnosis: Unsteadiness on feet (R26.81);Muscle weakness (generalized) (M62.81);Difficulty in walking, not elsewhere classified (R26.2)     Time: 3403-5248 PT Time Calculation (min) (ACUTE ONLY): 9 min  Charges:  $Therapeutic Activity: 8-22 mins                     [name], PT, DPT, GCS [phone]    Toniya Rozar 07/05/2022, 4:22 PM

## 2022-07-05 NOTE — Plan of Care (Signed)
  Problem: Education: Goal: Knowledge of General Education information will improve Description: Including pain rating scale, medication(s)/side effects and non-pharmacologic comfort measures Outcome: Progressing   Problem: Health Behavior/Discharge Planning: Goal: Ability to manage health-related needs will improve Outcome: Progressing   Problem: Clinical Measurements: Goal: Will remain free from infection Outcome: Progressing   Problem: Clinical Measurements: Goal: Respiratory complications will improve Outcome: Progressing   Problem: Clinical Measurements: Goal: Cardiovascular complication will be avoided Outcome: Progressing   Problem: Activity: Goal: Risk for activity intolerance will decrease Outcome: Progressing   Problem: Nutrition: Goal: Adequate nutrition will be maintained Outcome: Progressing   Problem: Elimination: Goal: Will not experience complications related to bowel motility Outcome: Progressing   Problem: Elimination: Goal: Will not experience complications related to urinary retention Outcome: Progressing   Problem: Pain Managment: Goal: General experience of comfort will improve Outcome: Progressing   Problem: Safety: Goal: Ability to remain free from injury will improve Outcome: Progressing   Problem: Skin Integrity: Goal: Risk for impaired skin integrity will decrease Outcome: Progressing

## 2022-07-05 NOTE — Progress Notes (Signed)
Patient ID: Carl Bell, male   DOB: 10-Feb-1951, 71 y.o.   MRN: [id number]     [place] Hospital Day(s): 13.   Interval History: Patient seen and examined, no acute events or new complaints overnight. Patient reports feeling well.  He denies abdominal pain.  He denies any nausea or vomiting.  Denies any problem with eating.  Endorses time bowel movement.  Vital signs in last 24 hours: [min-max] current  Temp:  [97.9 F (36.6 C)-98.6 F (37 C)] 98.6 F (37 C) (12/23 0824) Pulse Rate:  [88-91] 88 (12/23 0412) Resp:  [16-19] 16 (12/23 0824) BP: (133-158)/(82-98) 133/83 (12/23 0824) SpO2:  [94 %-95 %] 94 % (12/23 0412)     Height: 6\' 1"  (185.4 cm) Weight: 54.7 kg BMI (Calculated): 15.91   Physical Exam:  Constitutional: alert, cooperative and no distress  Respiratory: breathing non-labored at rest  Cardiovascular: regular rate and sinus rhythm  Gastrointestinal: soft, non-tender, and non-distended  Labs:     Latest Ref Rng & Units 06/26/2022   11:46 AM 06/25/2022    4:51 AM 06/24/2022    6:32 AM  CBC  WBC 4.0 - 10.5 K/uL 4.8  6.1  8.5   Hemoglobin 13.0 - 17.0 g/dL 10.4  9.9  9.2   Hematocrit 39.0 - 52.0 % 33.2  30.8  29.3   Platelets 150 - 400 K/uL 232  223  254       Latest Ref Rng & Units 06/26/2022   11:46 AM 06/25/2022    4:51 AM 06/24/2022    9:52 AM  CMP  Glucose 70 - 99 mg/dL 90  115  133   BUN 8 - 23 mg/dL 17  20  21    Creatinine 0.61 - 1.24 mg/dL 0.66  0.81  1.13   Sodium 135 - 145 mmol/L 138  141  143   Potassium 3.5 - 5.1 mmol/L 4.0  4.5  5.1   Chloride 98 - 111 mmol/L 107  111  111   CO2 22 - 32 mmol/L 26  28  21    Calcium 8.9 - 10.3 mg/dL 8.4  8.4  8.3     Imaging studies: No new pertinent imaging studies   Assessment/Plan:  71 y.o. male with above listed 12 Days Post-Op s/p Hartman's procedure. -Patient is doing well continue with stable vital signs. -Continue tolerating diet.  Adequate bowel movement. -No clinical  alteration -Pending skilled nursing facility authorization -We able to be discharged once approval for skilled nursing.  Arnold Long, MD

## 2022-07-06 LAB — GLUCOSE, CAPILLARY
Glucose-Capillary: 98 mg/dL (ref 70–99)
Glucose-Capillary: 100 mg/dL — ABNORMAL HIGH (ref 70–99)
Glucose-Capillary: 114 mg/dL — ABNORMAL HIGH (ref 70–99)

## 2022-07-06 NOTE — Progress Notes (Signed)
Patient ID: Carl Bell, male   DOB: 1950/12/02, 71 y.o.   MRN: [id number]     [place] Hospital Day(s): 14.   Interval History: Patient seen and examined, no acute events or new complaints overnight. Patient reports feeling well.  He denies abdominal pain.  He is awaiting his feeder to assist him with lunch.  He denies any nausea or vomiting.  Denies any problem with eating.  Endorses bowel function, and hopes to learn how to manage stoma appliance himself.  Vital signs in last 24 hours: [min-max] current  Temp:  [97.6 F (36.4 C)] 97.6 F (36.4 C) (12/24 0432) Pulse Rate:  [56-115] 72 (12/24 0600) Resp:  [16] 16 (12/23 1635) BP: (123-136)/(81-104) 123/81 (12/24 0432) SpO2:  [80 %-100 %] 93 % (12/24 0432) Weight:  [55 kg] 55 kg (12/24 0439)     Height: 6\' 1"  (185.4 cm) Weight: 55 kg BMI (Calculated): 16   Physical Exam:  Constitutional: alert, cooperative and no distress  Respiratory: breathing non-labored at rest  Cardiovascular: regular rate and sinus rhythm  Gastrointestinal: soft, non-tender, and non-distended, left-sided colostomy with soft/liquid stool in bag.  Labs:     Latest Ref Rng & Units 06/26/2022   11:46 AM 06/25/2022    4:51 AM 06/24/2022    6:32 AM  CBC  WBC 4.0 - 10.5 K/uL 4.8  6.1  8.5   Hemoglobin 13.0 - 17.0 g/dL 10.4  9.9  9.2   Hematocrit 39.0 - 52.0 % 33.2  30.8  29.3   Platelets 150 - 400 K/uL 232  223  254       Latest Ref Rng & Units 06/26/2022   11:46 AM 06/25/2022    4:51 AM 06/24/2022    9:52 AM  CMP  Glucose 70 - 99 mg/dL 90  115  133   BUN 8 - 23 mg/dL 17  20  21    Creatinine 0.61 - 1.24 mg/dL 0.66  0.81  1.13   Sodium 135 - 145 mmol/L 138  141  143   Potassium 3.5 - 5.1 mmol/L 4.0  4.5  5.1   Chloride 98 - 111 mmol/L 107  111  111   CO2 22 - 32 mmol/L 26  28  21    Calcium 8.9 - 10.3 mg/dL 8.4  8.4  8.3     Imaging studies: No new pertinent imaging studies   Assessment/Plan:  71 y.o. male with above listed 13 Days  Post-Op s/p Hartman's procedure. -Patient is doing well continue with stable vital signs. -Continues tolerating diet.  Adequate bowel movement. -No clinical alteration -Pending skilled nursing facility authorization -We able to be discharged once approval for skilled nursing.  Ronny Bacon, M.D., [facility]  07/06/2022 ; 1:21 PM

## 2022-07-07 LAB — GLUCOSE, CAPILLARY
Glucose-Capillary: 81 mg/dL (ref 70–99)
Glucose-Capillary: 127 mg/dL — ABNORMAL HIGH (ref 70–99)
Glucose-Capillary: 111 mg/dL — ABNORMAL HIGH (ref 70–99)
Glucose-Capillary: 110 mg/dL — ABNORMAL HIGH (ref 70–99)

## 2022-07-07 NOTE — Progress Notes (Signed)
Patient ID: Carl Bell, male   DOB: Nov 12, 1950, 71 y.o.   MRN: [id number]     [place] Hospital Day(s): 15.   Interval History: Patient seen and examined, no acute events or new complaints overnight. Patient reports feeling well.  He denies abdominal pain.   He denies any nausea or vomiting.  Denies any problem with eating.  Had new appliance change, currently has small draining area of the cephalad aspect of his incision, currently covered/protected with Tegaderm.  Vital signs in last 24 hours: [min-max] current  Temp:  [97.5 F (36.4 C)-98.6 F (37 C)] 97.5 F (36.4 C) (12/25 0800) Pulse Rate:  [55-78] 74 (12/25 0800) Resp:  [18] 18 (12/25 0800) BP: (115-159)/(75-92) 115/75 (12/25 0800) SpO2:  [90 %-100 %] 98 % (12/25 0800) Weight:  [55 kg] 55 kg (12/25 0500)     Height: 6\' 1"  (185.4 cm) Weight: 55 kg BMI (Calculated): 16   Physical Exam:  Constitutional: alert, cooperative and no distress  Respiratory: breathing non-labored at rest  Cardiovascular: regular rate and sinus rhythm  Gastrointestinal: soft, non-tender, and non-distended, left-sided colostomy with soft/liquid stool in bag. small moist area of the cephalad aspect of his incision, currently covered/protected with Tegaderm.  Labs:     Latest Ref Rng & Units 06/26/2022   11:46 AM 06/25/2022    4:51 AM 06/24/2022    6:32 AM  CBC  WBC 4.0 - 10.5 K/uL 4.8  6.1  8.5   Hemoglobin 13.0 - 17.0 g/dL 10.4  9.9  9.2   Hematocrit 39.0 - 52.0 % 33.2  30.8  29.3   Platelets 150 - 400 K/uL 232  223  254       Latest Ref Rng & Units 06/26/2022   11:46 AM 06/25/2022    4:51 AM 06/24/2022    9:52 AM  CMP  Glucose 70 - 99 mg/dL 90  115  133   BUN 8 - 23 mg/dL 17  20  21    Creatinine 0.61 - 1.24 mg/dL 0.66  0.81  1.13   Sodium 135 - 145 mmol/L 138  141  143   Potassium 3.5 - 5.1 mmol/L 4.0  4.5  5.1   Chloride 98 - 111 mmol/L 107  111  111   CO2 22 - 32 mmol/L 26  28  21    Calcium 8.9 - 10.3 mg/dL 8.4  8.4   8.3     Imaging studies: No new pertinent imaging studies   Assessment/Plan:  71 y.o. male with above listed 14 Days Post-Op s/p Hartman's procedure. -Patient is doing well  -Continues tolerating diet.  Adequate bowel function. -No clinical changes to make. -Pending skilled nursing facility authorization -We able to be discharged once approval for skilled nursing.  Ronny Bacon, M.D., [facility]  07/07/2022 ; 10:27 AM

## 2022-07-08 LAB — BASIC METABOLIC PANEL
Anion gap: 7 (ref 5–15)
BUN: 52 mg/dL — ABNORMAL HIGH (ref 8–23)
CO2: 27 mmol/L (ref 22–32)
Calcium: 8.3 mg/dL — ABNORMAL LOW (ref 8.9–10.3)
Chloride: 101 mmol/L (ref 98–111)
Creatinine, Ser: 3.63 mg/dL — ABNORMAL HIGH (ref 0.61–1.24)
GFR, Estimated: 17 mL/min — ABNORMAL LOW (ref >60)
Glucose, Bld: 97 mg/dL (ref 70–99)
Potassium: 4.7 mmol/L (ref 3.5–5.1)
Sodium: 135 mmol/L (ref 135–145)

## 2022-07-08 LAB — URINALYSIS, ROUTINE W REFLEX MICROSCOPIC
Bilirubin Urine: NEGATIVE
Glucose, UA: NEGATIVE mg/dL
Ketones, ur: NEGATIVE mg/dL
Leukocytes,Ua: NEGATIVE
Nitrite: NEGATIVE
Protein, ur: >=300 mg/dL — AB
Specific Gravity, Urine: 1.012 (ref 1.005–1.030)
Squamous Epithelial / HPF: NONE SEEN /HPF (ref 0–5)
pH: 8 (ref 5.0–8.0)

## 2022-07-08 LAB — CBC
HCT: 28.3 % — ABNORMAL LOW (ref 39.0–52.0)
Hemoglobin: 9.3 g/dL — ABNORMAL LOW (ref 13.0–17.0)
MCH: 24.2 pg — ABNORMAL LOW (ref 26.0–34.0)
MCHC: 32.9 g/dL (ref 30.0–36.0)
MCV: 73.7 fL — ABNORMAL LOW (ref 80.0–100.0)
Platelets: 298 10*3/uL (ref 150–400)
RBC: 3.84 MIL/uL — ABNORMAL LOW (ref 4.22–5.81)
RDW: 16.3 % — ABNORMAL HIGH (ref 11.5–15.5)
WBC: 5.8 10*3/uL (ref 4.0–10.5)
nRBC: 0 % (ref 0.0–0.2)

## 2022-07-08 LAB — GLUCOSE, CAPILLARY
Glucose-Capillary: 91 mg/dL (ref 70–99)
Glucose-Capillary: 102 mg/dL — ABNORMAL HIGH (ref 70–99)
Glucose-Capillary: 109 mg/dL — ABNORMAL HIGH (ref 70–99)

## 2022-07-08 MED ORDER — HEPARIN SODIUM (PORCINE) 5000 UNIT/ML IJ SOLN
5000.0000 [IU] | Freq: Three times a day (TID) | INTRAMUSCULAR | Status: DC
  Administered 2022-07-09 – 2022-07-10 (×4): 5000 [IU] via SUBCUTANEOUS
  Filled 2022-07-08 (×4): qty 1

## 2022-07-08 NOTE — Plan of Care (Signed)

## 2022-07-08 NOTE — Progress Notes (Signed)
PT Cancellation Note  Patient Details Name: Carl Bell MRN: [id number] DOB: [birth year]   Cancelled Treatment:    Reason Eval/Treat Not Completed: Patient declined, no reason specified Patient declining mobility stating "I just ate and need to let me food digest. I'll do it later, someone already asked me to get up but I'll do it later." Provided encouragement to perform exercises, patient declined. Will re-attempt at later date/time as appropriate.   Suhan Paci A. Gilford Rile PT, DPT [facility] - Acute Rehabilitation Services    Adalie Mand A Anshi Jalloh 07/08/2022, 10:35 AM

## 2022-07-08 NOTE — Consult Note (Addendum)
[name] ostomy follow up Pt is blind and unable to participate in pouch change.  He appears to be confused and tells me his "blindness is temporary and I just need to learn how to change the bag."  Discussed with patient that he would need to visualize the stoma and fit the pouch over the opening and this is very difficult to perform when he is unable to visualize the location and he will need total assistance with pouch application and emptying after discharge.  He states his mother usually assists him at home but she currently has a foot injury and would not be able to care for him at this time.  Type/location: LLQ end colostomy Stomal assessment/size: 1 and 1/8 inch stoma, red, moist, slightly above skin level.   Peristomal assessment: intact skin surrounding Output: 50cc liquid brown stool Ostomy pouching: 2pc. 2 and 1/4 inch pouching system with skin barrier ring Education provided: None; Pt was unable to assist related to vision challenges and there are no family members present.  Enrolled patient in [place] Start Discharge program: Yes, previously  Educational materials and 3 sets of each supply left at the bedside for staff nurse's use.    [place] nursing team will follow, will remain available to this patient, the nursing and medical teams.     Julien Girt MSN, RN, [place], [place], [place]

## 2022-07-08 NOTE — TOC Progression Note (Addendum)
jjTransition of Care [facility]) - Progression Note    Patient Details  Name: Carl Bell MRN: [id number] Date of Birth: 08-Oct-1950  Transition of Care [facility]) CM/SW Contact  Gerilyn Pilgrim, LCSW Phone Number: 07/08/2022, 10:23 AM  Clinical Narrative: SW lvm with [facility] to inquire about auth.  2:53pm Second VM left for [facility] about auth for this patient.      4:04pm  Left VM for Raquel Sarna at [facility] [phone]   4:35pm Spoke with Raquel Sarna who states they still do not have auth.    Expected Discharge Plan: [place] Barriers to Discharge: Continued Medical Work up  Expected Discharge Plan and Services     Post Acute Care Choice:  (TBD) Living arrangements for the past 2 months: Single Family Home                           HH Arranged: RN, PT, OT [facility] Agency: [place] Date [place]: 06/28/22 Time [place]: 0240 Representative spoke with at [place]: [place] Determinants of Health ([place]) Interventions [place]: No Food Insecurity (06/30/2022)  Housing: Low Risk  (06/30/2022)  Transportation Needs: No Transportation Needs (06/30/2022)  Utilities: Not At Risk (06/30/2022)  Tobacco Use: High Risk (06/24/2022)    Readmission Risk Interventions    06/24/2022   11:08 AM  Readmission Risk Prevention Plan  Transportation Screening Complete  PCP or Specialist Appt within 3-5 Days Complete  Social Work Consult for [place] Planning/Counseling [place] Not Applicable  Medication Review Press photographer) Complete

## 2022-07-08 NOTE — Progress Notes (Signed)
Subjective:  CC: Carl Bell is a 71 y.o. male  Hospital stay day 16, 15 Days Post-Op Hartman's procedure for sigmoid volvulus  HPI: No acute issues reported overnight. No complaints  ROS:  General: Denies weight loss, weight gain, fatigue, fevers, chills, and night sweats. Heart: Denies chest pain, palpitations, racing heart, irregular heartbeat, leg pain or swelling, and decreased activity tolerance. Respiratory: Denies breathing difficulty, shortness of breath, wheezing, cough, and sputum. GI: Denies change in appetite, heartburn, nausea, vomiting, constipation, diarrhea, and blood in stool. GU: Denies difficulty urinating, pain with urinating, urgency, frequency, blood in urine.   Objective:   Temp:  [98 F (36.7 C)-98.4 F (36.9 C)] 98 F (36.7 C) (12/26 0757) Pulse Rate:  [85-106] 106 (12/26 0757) Resp:  [18-20] 18 (12/26 0757) BP: (115-143)/(86-93) 115/88 (12/26 0757) SpO2:  [100 %] 100 % (12/26 0406)     Height: 6\' 1"  (185.4 cm) Weight: 55 kg BMI (Calculated): 16   Intake/Output this shift:   Intake/Output Summary (Last 24 hours) at 07/08/2022 1626 Last data filed at 07/08/2022 1000 Gross per 24 hour  Intake 120 ml  Output 70 ml  Net 50 ml    Constitutional :  alert, cooperative, appears stated age, and no distress  Respiratory:  clear to auscultation bilaterally  Cardiovascular:  regular rate and rhythm  Gastrointestinal: Soft, no guarding, ostomy intact, productive  Skin: Cool and moist.  incision clean dry and intact  Psychiatric: Normal affect, non-agitated, not confused       LABS:     Latest Ref Rng & Units 06/26/2022   11:46 AM 06/25/2022    4:51 AM 06/24/2022    9:52 AM  CMP  Glucose 70 - 99 mg/dL 90  115  133   BUN 8 - 23 mg/dL 17  20  21    Creatinine 0.61 - 1.24 mg/dL 0.66  0.81  1.13   Sodium 135 - 145 mmol/L 138  141  143   Potassium 3.5 - 5.1 mmol/L 4.0  4.5  5.1   Chloride 98 - 111 mmol/L 107  111  111   CO2 22 - 32 mmol/L 26  28  21     Calcium 8.9 - 10.3 mg/dL 8.4  8.4  8.3       Latest Ref Rng & Units 06/26/2022   11:46 AM 06/25/2022    4:51 AM 06/24/2022    6:32 AM  CBC  WBC 4.0 - 10.5 K/uL 4.8  6.1  8.5   Hemoglobin 13.0 - 17.0 g/dL 10.4  9.9  9.2   Hematocrit 39.0 - 52.0 % 33.2  30.8  29.3   Platelets 150 - 400 K/uL 232  223  254     RADS: N/a Assessment:   Sigmoid volvulus, s/p decompression colonoscopy, subsequent Hartman's procedure  Still pending SNF placement.  Patient today is stating that he does not want to go to SNF and insist he can take care of himself at home.  He states his vision is better now and that he can proceed with ostomy changes.  I asked patient to perform an ostomy change while in bed but he was unsuccessful stating that "he cannot see".  He was also unable to get out of bed and to use the restroom when asked to do so.  Despite patient's frustration, it is abundantly clear the patient is medically not independent enough to continue care at home.  And recommendation still stands to proceed with SNF placement.  I have verbalized this  multiple times the patient but he still was very unhappy with the plan, insisting that he is going to be going home.  I recommended social worker reach out to family members to update patient's wishes  We will continue looking for bed placement in the meantime.  labs/images/medications/previous chart entries reviewed personally and relevant changes/updates noted above.

## 2022-07-08 NOTE — Care Management Important Message (Signed)
Important Message  Patient Details  Name: Carl Bell MRN: [id number] Date of Birth: 1950/07/24   Medicare Important Message Given:  Yes     Dannette Barbara 07/08/2022, 11:21 AM

## 2022-07-09 ENCOUNTER — Inpatient Hospital Stay: Payer: Medicare Other

## 2022-07-09 LAB — GLUCOSE, CAPILLARY
Glucose-Capillary: 84 mg/dL (ref 70–99)
Glucose-Capillary: 79 mg/dL (ref 70–99)
Glucose-Capillary: 69 mg/dL — ABNORMAL LOW (ref 70–99)
Glucose-Capillary: 72 mg/dL (ref 70–99)

## 2022-07-09 MED ORDER — SODIUM CHLORIDE 0.9 % IV SOLN: INTRAVENOUS | Status: DC

## 2022-07-09 NOTE — Plan of Care (Signed)
  Problem: Education: Goal: Knowledge of General Education information will improve Description: Including pain rating scale, medication(s)/side effects and non-pharmacologic comfort measures Outcome: Not Progressing   Problem: Health Behavior/Discharge Planning: Goal: Ability to manage health-related needs will improve Outcome: Not Progressing   Problem: Clinical Measurements: Goal: Ability to maintain clinical measurements within normal limits will improve Outcome: Progressing Goal: Will remain free from infection Outcome: Progressing Goal: Diagnostic test results will improve Outcome: Progressing Goal: Respiratory complications will improve Outcome: Progressing Goal: Cardiovascular complication will be avoided Outcome: Progressing   Problem: Nutrition: Goal: Adequate nutrition will be maintained Outcome: Progressing   Problem: Coping: Goal: Level of anxiety will decrease Outcome: Progressing   Problem: Elimination: Goal: Will not experience complications related to bowel motility Outcome: Progressing Goal: Will not experience complications related to urinary retention Outcome: Progressing   Problem: Pain Managment: Goal: General experience of comfort will improve Outcome: Progressing   Problem: Safety: Goal: Ability to remain free from injury will improve Outcome: Progressing   Problem: Skin Integrity: Goal: Risk for impaired skin integrity will decrease Outcome: Progressing

## 2022-07-09 NOTE — Progress Notes (Signed)
Physical Therapy Treatment Patient Details Name: Carl Bell MRN: [id number] DOB: 01-15-1951 Today's Date: 07/09/2022   History of Present Illness Carl Bell is a 71 y.o. male with past medical history significant for low vision, COPD with chronic respiratory failure on 2 L of oxygen, hypertension, history of lung cancer status post chemoradiation therapy, history of liver cirrhosis and hypertension who presents to the ER for evaluation of a 3-day history of constipation associated with diffuse abdominal pain. S/p Hartman's procedure for sigmoid volvulus on 06/23/22.    PT Comments    Patient supine in bed with saturated linens. Required encouragement to participate with transfer to the chair for bed linen change. Able to complete bed mobility with supervision. Stood at bedside with RW and minA from low surface. Able to take pivotal steps with step by step cues and modA for balance and to avoid premature sitting. Oriented to self during session stating "we're not in the hospital anymore." Attempts at reorienting with no success.    Recommendations for follow up therapy are one component of a multi-disciplinary discharge planning process, led by the attending physician.  Recommendations may be updated based on patient status, additional functional criteria and insurance authorization.  Follow Up Recommendations  Skilled nursing-short term rehab (<3 hours/day) Can patient physically be transported by private vehicle: Yes   Assistance Recommended at Discharge Frequent or constant Supervision/Assistance  Patient can return home with the following A little help with walking and/or transfers;A little help with bathing/dressing/bathroom;Help with stairs or ramp for entrance   Equipment Recommendations  BSC/3in1    Recommendations for Other Services       Precautions / Restrictions Precautions Precautions: Fall Precaution Comments: blind Restrictions Weight Bearing Restrictions: No      Mobility  Bed Mobility Overal bed mobility: Needs Assistance Bed Mobility: Supine to Sit     Supine to sit: Supervision     General bed mobility comments: increased time to complete    Transfers Overall transfer level: Needs assistance Equipment used: Rolling Jeanelle Dake (2 wheels) Transfers: Sit to/from Stand, Bed to chair/wheelchair/BSC Sit to Stand: Min assist   Step pivot transfers: Mod assist       General transfer comment: assist to stand from EOB with cues for hand placement. Step by step directional cueing for step pivot transfer. ModA for balance and avoiding premature sitting    Ambulation/Gait                   Stairs             Wheelchair Mobility    Modified Rankin (Stroke Patients Only)       Balance Overall balance assessment: Needs assistance Sitting-balance support: No upper extremity supported, Feet supported Sitting balance-Leahy Scale: Good     Standing balance support: Bilateral upper extremity supported Standing balance-Leahy Scale: Fair                              Cognition Arousal/Alertness: Awake/alert Behavior During Therapy: WFL for tasks assessed/performed Overall Cognitive Status: No family/caregiver present to determine baseline cognitive functioning Area of Impairment: Orientation, Attention, Following commands, Safety/judgement                 Orientation Level: Disoriented to, Place, Time, Situation Current Attention Level: Focused   Following Commands: Follows one step commands with increased time Safety/Judgement: Decreased awareness of safety, Decreased awareness of deficits     General Comments: following ~  50% of commands. Oriented to self and stating "we aren't in the hospital anymore"        Exercises      General Comments General comments (skin integrity, edema, etc.): on RA,spO2 low 80s. Donned 2L O2 at end of session      Pertinent Vitals/Pain Pain Assessment Pain  Assessment: Faces Faces Pain Scale: Hurts little more Pain Location: butt/low back Pain Descriptors / Indicators: Aching Pain Intervention(s): Monitored during session    Home Living                          Prior Function            PT Goals (current goals can now be found in the care plan section) Acute Rehab PT Goals Patient Stated Goal: to go home PT Goal Formulation: With patient Time For Goal Achievement: 07/09/22 Potential to Achieve Goals: Fair Progress towards PT goals: Progressing toward goals    Frequency    Min 2X/week      PT Plan Discharge plan needs to be updated    Co-evaluation              AM-PAC PT "6 Clicks" Mobility   Outcome Measure  Help needed turning from your back to your side while in a flat bed without using bedrails?: A Little Help needed moving from lying on your back to sitting on the side of a flat bed without using bedrails?: A Little Help needed moving to and from a bed to a chair (including a wheelchair)?: A Lot Help needed standing up from a chair using your arms (e.g., wheelchair or bedside chair)?: A Little Help needed to walk in hospital room?: A Lot Help needed climbing 3-5 steps with a railing? : Total 6 Click Score: 14    End of Session Equipment Utilized During Treatment: Oxygen Activity Tolerance: Patient tolerated treatment well Patient left: in chair;with chair alarm set;with call bell/phone within reach;with nursing/sitter in room Nurse Communication: Mobility status PT Visit Diagnosis: Unsteadiness on feet (R26.81);Muscle weakness (generalized) (M62.81);Difficulty in walking, not elsewhere classified (R26.2)     Time: 0223-3612 PT Time Calculation (min) (ACUTE ONLY): 19 min  Charges:  $Therapeutic Activity: 8-22 mins                     Lissa Rowles A. Gilford Rile PT, DPT [facility] - Acute Rehabilitation Services    Aleeyah Bensen A Filippo Puls 07/09/2022, 10:37 AM

## 2022-07-09 NOTE — Progress Notes (Signed)
Occupational Therapy Treatment Patient Details Name: Carl Bell MRN: [id number] DOB: 1950-09-23 Today's Date: 07/09/2022   History of present illness Carl Bell is a 71 y.o. male with past medical history significant for low vision, COPD with chronic respiratory failure on 2 L of oxygen, hypertension, history of lung cancer status post chemoradiation therapy, history of liver cirrhosis and hypertension who presents to the ER for evaluation of a 3-day history of constipation associated with diffuse abdominal pain. S/p Hartman's procedure for sigmoid volvulus on 06/23/22.   OT comments  Carl Bell was seen for OT treatment on this date. Upon arrival to room NT at bed side, pt seated on foot plate of chair, requests to return to bed. Pt requires MIN A x2 + HHA sit<.stand and MIN A chair>bed t/f. SETUP self-drinking seated EOB, states he does not want to eat breakfast as it is night time. Attempted to re-orient pt however states we are at his home repeatedly. Brother in at end of session. Pt making good limited toward goals, will continue to follow POC. Discharge recommendation remains appropriate.     Recommendations for follow up therapy are one component of a multi-disciplinary discharge planning process, led by the attending physician.  Recommendations may be updated based on patient status, additional functional criteria and insurance authorization.    Follow Up Recommendations  Skilled nursing-short term rehab (<3 hours/day)     Assistance Recommended at Discharge Frequent or constant Supervision/Assistance  Patient can return home with the following  A little help with walking and/or transfers;A little help with bathing/dressing/bathroom;Help with stairs or ramp for entrance   Equipment Recommendations  BSC/3in1    Recommendations for Other Services      Precautions / Restrictions Precautions Precautions: Fall Precaution Comments: blind Restrictions Weight Bearing  Restrictions: No       Mobility Bed Mobility               General bed mobility comments: pt requesting to stay seated EOB    Transfers Overall transfer level: Needs assistance Equipment used: 2 person hand held assist Transfers: Sit to/from Stand, Bed to chair/wheelchair/BSC Sit to Stand: Min assist, +2 physical assistance     Step pivot transfers: Min assist           Balance Overall balance assessment: Needs assistance Sitting-balance support: No upper extremity supported, Feet supported Sitting balance-Leahy Scale: Good     Standing balance support: Bilateral upper extremity supported Standing balance-Leahy Scale: Fair                             ADL either performed or assessed with clinical judgement   ADL Overall ADL's : Needs assistance/impaired                                       General ADL Comments: MIN A x2 + HHA for simulated BSC t/f. SETUP self-drinking seated EOB    Extremity/Trunk Assessment Upper Extremity Assessment Upper Extremity Assessment: Generalized weakness   Lower Extremity Assessment Lower Extremity Assessment: Generalized weakness         Cognition Arousal/Alertness: Awake/alert Behavior During Therapy: WFL for tasks assessed/performed Overall Cognitive Status: Impaired/Different from baseline Area of Impairment: Orientation, Memory, Safety/judgement                 Orientation Level: Disoriented to, Place, Time  Following Commands: Follows one step commands with increased time Safety/Judgement: Decreased awareness of safety, Decreased awareness of deficits   Problem Solving: Slow processing, Decreased initiation General Comments: repeatedly we are at his house and it is night time        Exercises Other Exercises Other Exercises: educated on weight shifting to offload buttocks for pain control    Shoulder Instructions       General Comments on RA,spO2 low 80s. Donned  2L O2 at end of session    Pertinent Vitals/ Pain       Pain Assessment Pain Assessment: Faces Faces Pain Scale: Hurts even more Pain Location: buttocks Pain Descriptors / Indicators: Discomfort, Grimacing, Dull Pain Intervention(s): Limited activity within patient's tolerance, Repositioned, Patient requesting pain meds-RN notified   Frequency  Min 2X/week        Progress Toward Goals  OT Goals(current goals can now be found in the care plan section)  Progress towards OT goals: Progressing toward goals  Acute Rehab OT Goals Patient Stated Goal: to get stronger OT Goal Formulation: With patient Time For Goal Achievement: 07/23/22 Potential to Achieve Goals: Good ADL Goals Pt Will Perform Grooming: with modified independence;standing Pt Will Perform Lower Body Dressing: Independently;sit to/from stand Pt Will Transfer to Toilet: with modified independence;ambulating;regular height toilet  Plan Discharge plan remains appropriate;Frequency remains appropriate    Co-evaluation                 AM-PAC OT "6 Clicks" Daily Activity     Outcome Measure   Help from another person eating meals?: None Help from another person taking care of personal grooming?: A Lot Help from another person toileting, which includes using toliet, bedpan, or urinal?: A Little Help from another person bathing (including washing, rinsing, drying)?: A Lot Help from another person to put on and taking off regular upper body clothing?: None Help from another person to put on and taking off regular lower body clothing?: A Lot 6 Click Score: 17    End of Session    OT Visit Diagnosis: Other abnormalities of gait and mobility (R26.89);Muscle weakness (generalized) (M62.81)   Activity Tolerance Patient tolerated treatment well   Patient Left in bed;with call bell/phone within reach;with bed alarm set;with family/visitor present   Nurse Communication Patient requests pain meds         Time: 3557-3220 OT Time Calculation (min): 13 min  Charges: OT General Charges $OT Visit: 1 Visit OT Treatments $Self Care/Home Management : 8-22 mins  [name], M.S. OTR/L  07/09/22, 10:55 AM  ascom [phone]

## 2022-07-09 NOTE — Progress Notes (Signed)
This is a no charge note  We are asked to consult due to AKI.  71 year old male with past medical history of hypertension, COPD, depression, lung cancer, who is admitted by surgeon due to volvulus.  Patient is s/p of colonoscopy with reduction and s/p of colectomy.  Patient developed AKI, with creatinine 3.63, BUN 22.  Recent baseline creatinine 0.66 on 06/26/2022.  I recommended Dr. Lysle Pearl to consult renal and he agreed with this plan.      Ivor Costa, MD  [facility]   If 7PM-7AM, please contact night-coverage [email or web address] 07/09/2022, 12:05 PM

## 2022-07-09 NOTE — Progress Notes (Signed)
Pt found on floor. Port needle pulled out during fall. Pt had removed ostomy pouch and skin barrier. Pt stated, "I fell in the creek." Pt then requested that RN move the lawn mower out of the way. Pts VSS. Ostomy pouch replaced. IV team consult placed for port needle to be replaced. Dr. Lysle Pearl notified with no orders given at this time.

## 2022-07-09 NOTE — Progress Notes (Addendum)
Subjective:  CC: Carl Bell is a 71 y.o. male  Hospital stay day 17, 16 Days Post-Op Hartman's procedure for sigmoid volvulus  HPI: Darker urine output noted yesterday so labs drawn.  Family members concerned this morning of some confusion patient not oriented to place.  ROS:  General: Denies weight loss, weight gain, fatigue, fevers, chills, and night sweats. Heart: Denies chest pain, palpitations, racing heart, irregular heartbeat, leg pain or swelling, and decreased activity tolerance. Respiratory: Denies breathing difficulty, shortness of breath, wheezing, cough, and sputum. GI: Denies change in appetite, heartburn, nausea, vomiting, constipation, diarrhea, and blood in stool. GU: Denies difficulty urinating, pain with urinating, urgency, frequency, blood in urine.   Objective:   Temp:  [98 F (36.7 C)-98.2 F (36.8 C)] 98.1 F (36.7 C) (12/27 0753) Pulse Rate:  [56-110] 56 (12/27 0753) Resp:  [18-20] 18 (12/27 0753) BP: (106-141)/(78-93) 141/93 (12/27 0753) SpO2:  [86 %-98 %] 98 % (12/27 0753)     Height: 6\' 1"  (185.4 cm) Weight: 55 kg BMI (Calculated): 16   Intake/Output this shift:   Intake/Output Summary (Last 24 hours) at 07/09/2022 1235 Last data filed at 07/09/2022 0528 Gross per 24 hour  Intake 0 ml  Output 150 ml  Net -150 ml    Constitutional :  alert, cooperative, appears stated age, and no distress  Respiratory:  clear to auscultation bilaterally  Cardiovascular:  regular rate and rhythm  Gastrointestinal: Soft, no guarding, ostomy intact, productive  Skin: Cool and moist.  incision clean dry and intact  Psychiatric: Normal affect, non-agitated, not confused       LABS:     Latest Ref Rng & Units 07/08/2022    8:36 PM 06/26/2022   11:46 AM 06/25/2022    4:51 AM  CMP  Glucose 70 - 99 mg/dL 97  90  115   BUN 8 - 23 mg/dL 52  17  20   Creatinine 0.61 - 1.24 mg/dL 3.63  0.66  0.81   Sodium 135 - 145 mmol/L 135  138  141   Potassium 3.5 - 5.1  mmol/L 4.7  4.0  4.5   Chloride 98 - 111 mmol/L 101  107  111   CO2 22 - 32 mmol/L 27  26  28    Calcium 8.9 - 10.3 mg/dL 8.3  8.4  8.4       Latest Ref Rng & Units 07/08/2022    8:36 PM 06/26/2022   11:46 AM 06/25/2022    4:51 AM  CBC  WBC 4.0 - 10.5 K/uL 5.8  4.8  6.1   Hemoglobin 13.0 - 17.0 g/dL 9.3  10.4  9.9   Hematocrit 39.0 - 52.0 % 28.3  33.2  30.8   Platelets 150 - 400 K/uL 298  232  223     RADS: N/a Assessment:   Sigmoid volvulus, s/p decompression colonoscopy, subsequent Hartman's procedure  Visibly darker urine output as well as labs as noted above.  Nephrology consulted after discussion with hospitalist.  Appreciate recommendations.  Started normal saline in the meantime.    Patient otherwise is alert and oriented x 3 at time of exam, does not have any complaints of pain.  Will continue to monitor for consistent confusion.  Still pending SNF placement.    labs/images/medications/previous chart entries reviewed personally and relevant changes/updates noted above.

## 2022-07-09 NOTE — TOC Progression Note (Signed)
Transition of Care [facility] - Progression Note    Patient Details  Name: MANSOUR BALBOA MRN: [id number] Date of Birth: 1951-04-20  Transition of Care [facility]) CM/SW Contact  Beverly Sessions, RN Phone Number: 07/09/2022, 4:12 PM  Clinical Narrative:    Per Philipp Ovens, RN, BSN  Clinical Team Lead - [place]  P [phone]  F  [phone] [email or web address]  "hospice facility will need to send the update to CMS/Medicare, which will cascade back to [place] and we can update accordingly"          Expected Discharge Plan: [place] Barriers to Discharge: Continued Medical Work up  Expected Discharge Plan and Services     Post Acute Care Choice:  (TBD) Living arrangements for the past 2 months: Single Family Home                           HH Arranged: RN, PT, OT [facility] Agency: [place] Date [place]: 06/28/22 Time [place]: [phone] Representative spoke with at [place]: [place] Determinants of Health ([place]) Interventions [place]: No Food Insecurity (06/30/2022)  Housing: Low Risk  (06/30/2022)  Transportation Needs: No Transportation Needs (06/30/2022)  Utilities: Not At Risk (06/30/2022)  Tobacco Use: High Risk (06/24/2022)    Readmission Risk Interventions    06/24/2022   11:08 AM  Readmission Risk Prevention Plan  Transportation Screening Complete  PCP or Specialist Appt within 3-5 Days Complete  Social Work Consult for [place] Planning/Counseling [place] Not Applicable  Medication Review Press photographer) Complete

## 2022-07-09 NOTE — TOC Progression Note (Signed)
Transition of Care [facility]) - Progression Note    Patient Details  Name: Carl Bell MRN: [id number] Date of Birth: 1951/01/05  Transition of Care [facility]) CM/SW Contact  Beverly Sessions, RN Phone Number: 07/09/2022, 10:20 AM  Clinical Narrative:    Damaris Schooner with Altha Harm at [facility].  Auth still pending   Expected Discharge Plan: [place] Barriers to Discharge: Continued Medical Work up  Expected Discharge Plan and Services     Post Acute Care Choice:  (TBD) Living arrangements for the past 2 months: Single Family Home                           HH Arranged: RN, PT, OT [facility] Agency: [place] Date [place]: 06/28/22 Time [place]: 3748 Representative spoke with at [place]: [place] Determinants of Health ([place]) Interventions [place]: No Food Insecurity (06/30/2022)  Housing: Low Risk  (06/30/2022)  Transportation Needs: No Transportation Needs (06/30/2022)  Utilities: Not At Risk (06/30/2022)  Tobacco Use: High Risk (06/24/2022)    Readmission Risk Interventions    06/24/2022   11:08 AM  Readmission Risk Prevention Plan  Transportation Screening Complete  PCP or Specialist Appt within 3-5 Days Complete  Social Work Consult for [place] Planning/Counseling [place] Not Applicable  Medication Review Press photographer) Complete

## 2022-07-09 NOTE — Consult Note (Signed)
[facility]  CONSULT NOTE    Date: 07/09/2022                  Patient Name:  Carl Bell  MRN: [id number]  DOB: 1950/12/06  Age / Sex: 71 y.o., male         PCP: Kirk Ruths, MD                 Service Requesting Consult: [facility]                 Reason for Consult: Acute kidney injury            History of Present Illness: Carl Bell is a 71 y.o.  male with past medical history of COPD, glaucoma, hypertension, and lung cancer, who was admitted to [facility] on 06/22/2022 for Small bowel obstruction ([place]) [K56.609] Volvulus of intestine ([place]) [K56.2] Sigmoid volvulus ([place]) [K56.2]  Patient presents to the emergency department with complaints of abdominal pain and constipation.  Patient also reports poor appetite.  Patient is seen and evaluated at bedside.  Currently sitting at bedside.  Untouched lunch tray at bedside.  Patient states he has never had a great appetite.  Denies nausea, vomiting, or diarrhea.  States he began having abdominal pain a couple days prior to ED arrival.  Reports recent weight loss. Currently on 2L [place]. Patient states he lives with his mother. No history of kidney concerns. Denies NSAID use.   CT abdomen pelvis shows a high-grade distal colonic obstruction with a large right groin hernia and chronic calcified pancreatitis.  Renal cyst appears stable since 2017.Normal renal function noted on admission. Now creatinine elevated 3.63 with GFR 17.    Medications: Outpatient medications: Medications Prior to Admission  Medication Sig Dispense Refill Last Dose   dorzolamide-timolol (COSOPT) 22.3-6.8 MG/ML ophthalmic solution Place 1 drop into both eyes 2 (two) times daily.   Past Week   latanoprost (XALATAN) 0.005 % ophthalmic solution Place 1 drop into both eyes at bedtime.   Past Week   albuterol (VENTOLIN HFA) 108 (90 Base) MCG/ACT inhaler Inhale 2 puffs into the lungs every 6 (six) hours as needed for wheezing or shortness of  breath. (Patient not taking: Reported on 06/22/2022) 8 g 0 Not Taking   dextromethorphan-guaiFENesin (MUCINEX DM) 30-600 MG 12hr tablet Take 1 tablet by mouth 2 (two) times daily. (Patient not taking: Reported on 12/03/2021) 30 tablet 0    furosemide (LASIX) 40 MG tablet Take by mouth. (Patient not taking: Reported on 12/03/2021)      ipratropium-albuterol (DUONEB) 0.5-2.5 (3) MG/3ML SOLN Take 3 mLs by nebulization every 6 (six) hours as needed (Shortness of breath). (Patient not taking: Reported on 06/22/2022) 360 mL 0 Not Taking   mirtazapine (REMERON) 7.5 MG tablet Take by mouth. (Patient not taking: Reported on 06/22/2022)   Not Taking   nicotine (NICODERM CQ - DOSED IN MG/24 HR) 7 mg/24hr patch Place 1 patch (7 mg total) onto the skin daily. (Patient not taking: Reported on 12/03/2021) 28 patch 0    potassium chloride (KLOR-CON) 10 MEQ tablet Take by mouth. (Patient not taking: Reported on 06/22/2022)   Not Taking   predniSONE (DELTASONE) 10 MG tablet Take 5 tablets (50 mg total) by mouth daily. For 3 days, then take 4 tablets for next 3 days, 3 tablets for 3 days, 2 tablets for 3 days, 1 tablet for 3 days (Patient not taking: Reported on 12/03/2021) 50 tablet 0  TRELEGY ELLIPTA 100-62.5-25 MCG/ACT AEPB Inhale 1 puff into the lungs daily. (Patient not taking: Reported on 06/22/2022)   Not Taking    Current medications: Current Facility-Administered Medications  Medication Dose Route Frequency Provider Last Rate Last Admin   0.9 %  sodium chloride infusion   Intravenous Continuous Sakai, Isami, DO 100 mL/hr at 07/09/22 1221 New Bag at 07/09/22 1221   albuterol (PROVENTIL) (2.5 MG/3ML) 0.083% nebulizer solution 2.5 mg  2.5 mg Inhalation Q6H PRN Sakai, Isami, DO       Chlorhexidine Gluconate Cloth 2 % PADS 6 each  6 each Topical Daily Sakai, Isami, DO   6 each at 07/09/22 1051   docusate sodium (COLACE) capsule 100 mg  100 mg Oral Daily PRN Lysle Pearl, Isami, DO   100 mg at 06/28/22 1534    dorzolamide-timolol (COSOPT) 2-0.5 % ophthalmic solution 1 drop  1 drop Both Eyes BID Sakai, Isami, DO   1 drop at 07/09/22 0831   feeding supplement (ENSURE ENLIVE / ENSURE PLUS) liquid 237 mL  237 mL Oral TID BM Sakai, Isami, DO   237 mL at 07/08/22 2035   fluticasone furoate-vilanterol (BREO ELLIPTA) 100-25 MCG/ACT 1 puff  1 puff Inhalation Daily Sakai, Isami, DO   1 puff at 07/09/22 1050   And   umeclidinium bromide (INCRUSE ELLIPTA) 62.5 MCG/ACT 1 puff  1 puff Inhalation Daily Sakai, Isami, DO   1 puff at 07/09/22 1051   heparin injection 5,000 Units  5,000 Units Subcutaneous Q8H Dorothe Pea, RPH   5,000 Units at 07/09/22 0546   hydrALAZINE (APRESOLINE) injection 10 mg  10 mg Intravenous Q6H PRN Lysle Pearl, Isami, DO       HYDROcodone-acetaminophen (NORCO/VICODIN) 5-325 MG per tablet 1-2 tablet  1-2 tablet Oral Q4H PRN Lysle Pearl, Isami, DO   2 tablet at 07/09/22 1048   ipratropium-albuterol (DUONEB) 0.5-2.5 (3) MG/3ML nebulizer solution 3 mL  3 mL Nebulization Q6H PRN Sakai, Isami, DO       latanoprost (XALATAN) 0.005 % ophthalmic solution 1 drop  1 drop Both Eyes QHS Sakai, Isami, DO   1 drop at 07/08/22 2036   multivitamin with minerals tablet 1 tablet  1 tablet Oral Daily Sakai, Isami, DO   1 tablet at 07/09/22 0831   ondansetron (ZOFRAN-ODT) disintegrating tablet 4 mg  4 mg Oral Q6H PRN Lysle Pearl, Isami, DO       Or   ondansetron (ZOFRAN) injection 4 mg  4 mg Intravenous Q6H PRN Sakai, Isami, DO       polyethylene glycol (MIRALAX / GLYCOLAX) packet 17 g  17 g Oral Daily Piscoya, Jose, MD   17 g at 07/09/22 0831   QUEtiapine (SEROQUEL) tablet 25 mg  25 mg Oral QHS Lorella Nimrod, MD   25 mg at 07/08/22 2032   sodium chloride flush (NS) 0.9 % injection 10-40 mL  10-40 mL Intracatheter PRN Lysle Pearl, Isami, DO       traMADol (ULTRAM) tablet 50 mg  50 mg Oral Q6H PRN Sakai, Isami, DO   50 mg at 07/07/22 1433      Allergies: No Known Allergies    Past Medical History: Past Medical History:   Diagnosis Date   COPD (chronic obstructive pulmonary disease) ([place])    Emphysema of lung ([place])    Glaucoma    Hypertension    Lung cancer ([place])      Past Surgical History: Past Surgical History:  Procedure Laterality Date   COLON RESECTION SIGMOID N/A 06/23/2022   Procedure:  COLON RESECTION SIGMOID;  Surgeon: [name], DO;  Location: [facility] ORS;  Service: General;  Laterality: N/A;   COLONOSCOPY N/A 06/22/2022   Procedure: COLONOSCOPY;  Surgeon: [name], DO;  Location: [place] [facility]OS[facility];  Service: General;  Laterality: N/A;   COLOSTOMY  06/23/2022   Procedure: COLOSTOMY;  Surgeon: [name], DO;  Location: [facility] ORS;  Service: General;;   LOWER EXTREMITY ANGIOGRAPHY Left 07/11/2019   Procedure: LOWER EXTREMITY ANGIOGRAPHY;  Surgeon: Algernon Huxley, MD;  Location: [place] [facility];  Service: Cardiovascular;  Laterality: Left;   LUNG BIOPSY     PERIPHERAL VASCULAR CATHETERIZATION N/A 09/24/2015   Procedure: [name];  Surgeon: Algernon Huxley, MD;  Location: [place] [facility];  Service: Cardiovascular;  Laterality: N/A;   Port a cath placement       Family History: Family History  Problem Relation Age of Onset   Diabetes Mellitus II Mother    Hypertension Father      Social History: Social History   Socioeconomic History   Marital status: Single    Spouse name: Not on file   Number of children: Not on file   Years of education: Not on file   Highest education level: Not on file  Occupational History   Not on file  Tobacco Use   Smoking status: Every Day    Packs/day: 0.50    Years: 50.00    Total pack years: 25.00    Types: Cigarettes   Smokeless tobacco: Never  Vaping Use   Vaping Use: Never used  Substance and Sexual Activity   Alcohol use: Yes   Drug use: No   Sexual activity: Not on file  Other Topics Concern   Not on file  Social History Narrative   Not on file   Social Determinants of Health   Financial Resource Strain: Not on  file  Food Insecurity: No Food Insecurity (06/30/2022)   Hunger Vital Sign    Worried About Running Out of Food in the Last Year: Never true    Ran Out of Food in the Last Year: Never true  Transportation Needs: No Transportation Needs (06/30/2022)   PRAPARE - Hydrologist (Medical): No    Lack of Transportation (Non-Medical): No  Physical Activity: Not on file  Stress: Not on file  Social Connections: Not on file  Intimate Partner Violence: Not At Risk (06/30/2022)   Humiliation, Afraid, Rape, and Kick questionnaire    Fear of Current or Ex-Partner: No    Emotionally Abused: No    Physically Abused: No    Sexually Abused: No     Review of Systems: Review of Systems  Constitutional:  Positive for weight loss. Negative for chills, fever and malaise/fatigue.  HENT:  Negative for congestion, sore throat and tinnitus.   Eyes:  Negative for blurred vision and redness.  Respiratory:  Negative for cough, shortness of breath and wheezing.   Cardiovascular:  Negative for chest pain, palpitations, claudication and leg swelling.  Gastrointestinal:  Positive for abdominal pain and constipation. Negative for blood in stool, diarrhea, nausea and vomiting.  Genitourinary:  Negative for flank pain, frequency and hematuria.  Musculoskeletal:  Negative for back pain, falls and myalgias.  Skin:  Negative for rash.  Neurological:  Negative for dizziness, weakness and headaches.  Endo/Heme/Allergies:  Does not bruise/bleed easily.  Psychiatric/Behavioral:  Negative for depression. The patient is not nervous/anxious and does not have insomnia.     Vital Signs: Blood  pressure (!) 129/94, pulse 85, temperature 97.6 F (36.4 C), temperature source Oral, resp. rate 18, height 6\' 1"  (1.854 m), weight 55 kg, SpO2 96 %.  Weight trends: Filed Weights   07/02/22 0500 07/06/22 0439 07/07/22 0500  Weight: 54.7 kg 55 kg 55 kg    Physical Exam: General: NAD, sitting at  bedside  Head: Normocephalic, atraumatic. Moist oral mucosal membranes  Eyes: Anicteric  Lungs:  Clear to auscultation, normal effort, [place] O2  Heart: Regular rate and rhythm  Abdomen:  Soft, nontender, Colostomy  Extremities:  No peripheral edema.  Neurologic: Nonfocal, moving all four extremities  Skin: No lesions, dry  Access: None     Lab results: Basic Metabolic Panel: Recent Labs  Lab 07/08/22 2036  NA 135  K 4.7  CL 101  CO2 27  GLUCOSE 97  BUN 52*  CREATININE 3.63*  CALCIUM 8.3*    Liver Function Tests: No results for input(s): "AST", "ALT", "ALKPHOS", "BILITOT", "PROT", "ALBUMIN" in the last 168 hours. No results for input(s): "LIPASE", "AMYLASE" in the last 168 hours. No results for input(s): "AMMONIA" in the last 168 hours.  CBC: Recent Labs  Lab 07/08/22 2036  WBC 5.8  HGB 9.3*  HCT 28.3*  MCV 73.7*  PLT 298    Cardiac Enzymes: No results for input(s): "CKTOTAL", "CKMB", "CKMBINDEX", "TROPONINI" in the last 168 hours.  BNP: Invalid input(s): "POCBNP"  CBG: Recent Labs  Lab 07/08/22 0756 07/08/22 1702 07/08/22 2142 07/09/22 0755 07/09/22 1137  GLUCAP 91 102* 109* 84 79    Microbiology: Results for orders placed or performed during the hospital encounter of 04/13/22  Resp Panel by RT-PCR (Flu A&B, Covid) Anterior Nasal Swab     Status: None   Collection Time: 04/13/22  8:23 PM   Specimen: Anterior Nasal Swab  Result Value Ref Range Status   SARS Coronavirus 2 by RT PCR NEGATIVE NEGATIVE Final    Comment: (NOTE) SARS-CoV-2 target nucleic acids are NOT DETECTED.  The SARS-CoV-2 RNA is generally detectable in upper respiratory specimens during the acute phase of infection. The lowest concentration of SARS-CoV-2 viral copies this assay can detect is 138 copies/mL. A negative result does not preclude SARS-Cov-2 infection and should not be used as the sole basis for treatment or other patient management decisions. A negative result may  occur with  improper specimen collection/handling, submission of specimen other than nasopharyngeal swab, presence of viral mutation(s) within the areas targeted by this assay, and inadequate number of viral copies(<138 copies/mL). A negative result must be combined with clinical observations, patient history, and epidemiological information. The expected result is Negative.  Fact Sheet for Patients:  EntrepreneurPulse.com.au  Fact Sheet for Healthcare Providers:  IncredibleEmployment.be  This test is no t yet approved or cleared by the [place] FDA and  has been authorized for detection and/or diagnosis of SARS-CoV-2 by FDA under an Emergency Use Authorization (EUA). This EUA will remain  in effect (meaning this test can be used) for the duration of the COVID-19 declaration under Section 564(b)(1) of the Act, 21 U.S.C.section 360bbb-3(b)(1), unless the authorization is terminated  or revoked sooner.       Influenza A by PCR NEGATIVE NEGATIVE Final   Influenza B by PCR NEGATIVE NEGATIVE Final    Comment: (NOTE) The Xpert Xpress SARS-CoV-2/FLU/RSV plus assay is intended as an aid in the diagnosis of influenza from Nasopharyngeal swab specimens and should not be used as a sole basis for treatment. Nasal washings and aspirates are unacceptable  for Xpert Xpress SARS-CoV-2/FLU/RSV testing.  Fact Sheet for Patients: EntrepreneurPulse.com.au  Fact Sheet for Healthcare Providers: IncredibleEmployment.be  This test is not yet approved or cleared by the [place] FDA and has been authorized for detection and/or diagnosis of SARS-CoV-2 by FDA under an Emergency Use Authorization (EUA). This EUA will remain in effect (meaning this test can be used) for the duration of the COVID-19 declaration under Section 564(b)(1) of the Act, 21 U.S.C. section 360bbb-3(b)(1), unless the authorization is terminated  or revoked.  Performed at [facility], [address] [place],  [address]     Coagulation Studies: No results for input(s): "LABPROT", "INR" in the last 72 hours.  Urinalysis: Recent Labs    07/08/22 1714  [place] 1.012  PHURINE 8.0  GLUCOSEU NEGATIVE  HGBUR LARGE*  BILIRUBINUR NEGATIVE  KETONESUR NEGATIVE  PROTEINUR >=300*  NITRITE NEGATIVE  LEUKOCYTESUR NEGATIVE      Imaging: US RENAL  Result Date: 07/09/2022 CLINICAL DATA:  Acute kidney injury EXAM: RENAL / URINARY TRACT ULTRASOUND COMPLETE COMPARISON:  CT 06/22/2022 FINDINGS: Right Kidney: Renal measurements: 10.7 by 4.3 x 4.9 cm = volume: 119 mL. Moderate to severe right hydronephrosis. Complex cystic lesion in the lower pole of the right kidney measures 1.6 x 1.4 x 1.1 cm. Normal echotexture. Left Kidney: Renal measurements: 10.1 x 4.9 x 4.2 cm = volume: 109 mL. Moderate to severe left hydronephrosis. Shadowing from the previously seen rim calcified lesion in the midpole of the left kidney. Normal echotexture. Bladder: Appears normal for degree of bladder distention. Other: Ascites noted in the abdomen and pelvis. IMPRESSION: Moderate to severe bilateral hydronephrosis, new since prior CT. Previously seen rim calcified lesions in both kidneys are noted but difficult to characterize due to shadowing from the calcifications. Moderate ascites. Electronically Signed   By: Rolm Baptise M.D.   On: 07/09/2022 15:25     Assessment & Plan: Mr. BERLYN MALINA is a 71 y.o.  male with past medical history of COPD, glaucoma, hypertension, and lung cancer, who was admitted to [facility] on 06/22/2022 for Small bowel obstruction ([place]) [K56.609] Volvulus of intestine ([facility]) [K56.2] Sigmoid volvulus ([place]) [K56.2]  Acute kidney injury likely secondary to moderate to severe bilateral hydronephrosis seen on renal ultrasound.  Normal renal function noted on admission.  IV contrast exposure on admission. Reduced  urine output of 500 mL yesterday.  Improved urine output today, 1.1 L recorded on day shift thus far. Recommend foley catheter and follow up CT abd/pelvis w/o contrast to evaluate. Supportive care with pain medications as needed.   2. Anemia, minimal blood loss during surgical procedure.  Lab Results  Component Value Date   HGB 9.3 (L) 07/08/2022    Hgb above target. Will continue to monitor.   3. Hypertension, essential. Home regimen includes furosemide, currently held in setting of acute kidney injury. Blood pressure 129/94 today.   LOS: [place] 12/27/20233:39 PM

## 2022-07-10 ENCOUNTER — Inpatient Hospital Stay: Payer: Medicare Other

## 2022-07-10 DIAGNOSIS — N049 Nephrotic syndrome with unspecified morphologic changes: Secondary | ICD-10-CM | POA: Diagnosis not present

## 2022-07-10 DIAGNOSIS — N179 Acute kidney failure, unspecified: Secondary | ICD-10-CM | POA: Diagnosis present

## 2022-07-10 DIAGNOSIS — K56609 Unspecified intestinal obstruction, unspecified as to partial versus complete obstruction: Secondary | ICD-10-CM

## 2022-07-10 DIAGNOSIS — N139 Obstructive and reflux uropathy, unspecified: Secondary | ICD-10-CM | POA: Diagnosis present

## 2022-07-10 DIAGNOSIS — K562 Volvulus: Secondary | ICD-10-CM

## 2022-07-10 LAB — BODY FLUID CELL COUNT WITH DIFFERENTIAL
Eos, Fluid: 1 %
Lymphs, Fluid: 70 %
Monocyte-Macrophage-Serous Fluid: 23 %
Neutrophil Count, Fluid: 6 %
Total Nucleated Cell Count, Fluid: 225 cu mm

## 2022-07-10 LAB — LACTATE DEHYDROGENASE, PLEURAL OR PERITONEAL FLUID: LD, Fluid: 51 U/L — ABNORMAL HIGH (ref 3–23)

## 2022-07-10 LAB — CREATININE, FLUID (PLEURAL, PERITONEAL, JP DRAINAGE): Creat, Fluid: 1.9 mg/dL

## 2022-07-10 LAB — CBC
HCT: 23.6 % — ABNORMAL LOW (ref 39.0–52.0)
Hemoglobin: 7.8 g/dL — ABNORMAL LOW (ref 13.0–17.0)
MCH: 24.6 pg — ABNORMAL LOW (ref 26.0–34.0)
MCHC: 33.1 g/dL (ref 30.0–36.0)
MCV: 74.4 fL — ABNORMAL LOW (ref 80.0–100.0)
Platelets: 228 10*3/uL (ref 150–400)
RBC: 3.17 MIL/uL — ABNORMAL LOW (ref 4.22–5.81)
RDW: 16.7 % — ABNORMAL HIGH (ref 11.5–15.5)
WBC: 4 10*3/uL (ref 4.0–10.5)
nRBC: 0 % (ref 0.0–0.2)

## 2022-07-10 LAB — COMPREHENSIVE METABOLIC PANEL
ALT: 9 U/L (ref 0–44)
AST: 16 U/L (ref 15–41)
Albumin: 2.2 g/dL — ABNORMAL LOW (ref 3.5–5.0)
Alkaline Phosphatase: 50 U/L (ref 38–126)
Anion gap: 6 (ref 5–15)
BUN: 49 mg/dL — ABNORMAL HIGH (ref 8–23)
CO2: 25 mmol/L (ref 22–32)
Calcium: 8.3 mg/dL — ABNORMAL LOW (ref 8.9–10.3)
Chloride: 108 mmol/L (ref 98–111)
Creatinine, Ser: 2.14 mg/dL — ABNORMAL HIGH (ref 0.61–1.24)
GFR, Estimated: 32 mL/min — ABNORMAL LOW (ref >60)
Glucose, Bld: 101 mg/dL — ABNORMAL HIGH (ref 70–99)
Potassium: 4.1 mmol/L (ref 3.5–5.1)
Sodium: 139 mmol/L (ref 135–145)
Total Bilirubin: 0.6 mg/dL (ref 0.3–1.2)
Total Protein: 4.6 g/dL — ABNORMAL LOW (ref 6.5–8.1)

## 2022-07-10 LAB — GLUCOSE, CAPILLARY
Glucose-Capillary: 81 mg/dL (ref 70–99)
Glucose-Capillary: 86 mg/dL (ref 70–99)
Glucose-Capillary: 103 mg/dL — ABNORMAL HIGH (ref 70–99)
Glucose-Capillary: 130 mg/dL — ABNORMAL HIGH (ref 70–99)

## 2022-07-10 LAB — ALBUMIN, PLEURAL OR PERITONEAL FLUID: Albumin, Fluid: <1.5 g/dL

## 2022-07-10 LAB — BILIRUBIN, TOTAL: Total Bilirubin: 0.6 mg/dL (ref 0.3–1.2)

## 2022-07-10 MED ORDER — SODIUM CHLORIDE 0.9 % IV SOLN
150.0000 mg | Freq: Once | INTRAVENOUS | Status: AC
  Administered 2022-07-10: 150 mg via INTRAVENOUS
  Filled 2022-07-10: qty 7.5

## 2022-07-10 MED ORDER — LIDOCAINE HCL (PF) 1 % IJ SOLN
5.0000 mL | Freq: Once | INTRAMUSCULAR | Status: AC
  Administered 2022-07-10: 5 mL via INTRADERMAL

## 2022-07-10 MED ORDER — ALBUMIN HUMAN 25 % IV SOLN: 12.5000 g | INTRAVENOUS | Status: DC | PRN

## 2022-07-10 NOTE — Progress Notes (Addendum)
Subjective:  CC: Carl Bell is a 71 y.o. male  Hospital stay day 18, 17 Days Post-Op Hartman's procedure for sigmoid volvulus  HPI: No complaints from patient overnight.  A&Ox3.  ROS:  General: Denies weight loss, weight gain, fatigue, fevers, chills, and night sweats. Heart: Denies chest pain, palpitations, racing heart, irregular heartbeat, leg pain or swelling, and decreased activity tolerance. Respiratory: Denies breathing difficulty, shortness of breath, wheezing, cough, and sputum. GI: Denies change in appetite, heartburn, nausea, vomiting, constipation, diarrhea, and blood in stool. GU: Denies difficulty urinating, pain with urinating, urgency, frequency, blood in urine.   Objective:   Temp:  [97.5 F (36.4 C)-98.1 F (36.7 C)] 97.5 F (36.4 C) (12/28 0747) Pulse Rate:  [79-110] 79 (12/28 0747) Resp:  [16-18] 16 (12/28 0747) BP: (129-146)/(84-97) 133/84 (12/28 0747) SpO2:  [95 %-100 %] 100 % (12/28 0747)     Height: 6\' 1"  (185.4 cm) Weight: 55 kg BMI (Calculated): 16   Intake/Output this shift:   Intake/Output Summary (Last 24 hours) at 07/10/2022 1344 Last data filed at 07/10/2022 1030 Gross per 24 hour  Intake 388.91 ml  Output 2200 ml  Net -1811.09 ml    Constitutional :  alert, cooperative, appears stated age, and no distress  Respiratory:  clear to auscultation bilaterally  Cardiovascular:  regular rate and rhythm  Gastrointestinal: Soft, no guarding, ostomy intact, productive  Skin: Cool and moist.  incision healed  Psychiatric: Normal affect, non-agitated, not confused       LABS:     Latest Ref Rng & Units 07/10/2022    5:22 AM 07/08/2022    8:36 PM 06/26/2022   11:46 AM  CMP  Glucose 70 - 99 mg/dL 101  97  90   BUN 8 - 23 mg/dL 49  52  17   Creatinine 0.61 - 1.24 mg/dL 2.14  3.63  0.66   Sodium 135 - 145 mmol/L 139  135  138   Potassium 3.5 - 5.1 mmol/L 4.1  4.7  4.0   Chloride 98 - 111 mmol/L 108  101  107   CO2 22 - 32 mmol/L 25  27  26     Calcium 8.9 - 10.3 mg/dL 8.3  8.3  8.4   Total Protein 6.5 - 8.1 g/dL 4.6     Total Bilirubin 0.3 - 1.2 mg/dL 0.6     Alkaline Phos 38 - 126 U/L 50     AST 15 - 41 U/L 16     ALT 0 - 44 U/L 9         Latest Ref Rng & Units 07/10/2022    5:22 AM 07/08/2022    8:36 PM 06/26/2022   11:46 AM  CBC  WBC 4.0 - 10.5 K/uL 4.0  5.8  4.8   Hemoglobin 13.0 - 17.0 g/dL 7.8  9.3  10.4   Hematocrit 39.0 - 52.0 % 23.6  28.3  33.2   Platelets 150 - 400 K/uL 228  298  232     RADS: CLINICAL DATA:  Hydronephrosis   EXAM: CT ABDOMEN AND PELVIS WITHOUT CONTRAST   TECHNIQUE: Multidetector CT imaging of the abdomen and pelvis was performed following the standard protocol without IV contrast.   RADIATION DOSE REDUCTION: This exam was performed according to the departmental dose-optimization program which includes automated exposure control, adjustment of the mA and/or kV according to patient size and/or use of iterative reconstruction technique.   COMPARISON:  CT 06/22/2022   FINDINGS: Lower chest: Small bilateral  pleural effusions with adjacent atelectasis, right greater than left.   Hepatobiliary: No focal liver abnormality is seen. Gallbladder is unremarkable.   Pancreas: Diffuse pancreatic calcifications with antrum and mild ductal dilation and a pancreatic tail cyst measuring up to 1.5 cm, unchanged.   Spleen: Normal in size without focal abnormality.   Adrenals/Urinary Tract: Adrenal glands are unremarkable. Unchanged partially calcified renal cysts. There is persistent bilateral hydronephrosis, left greater than right. Pelvic dilation is in part related to an extrarenal pelvis bilaterally. Persistent proximal hydroureter which tapers and appears decompressed distally. Bladder is decompressed with Foley catheter in place and associated intraluminal gas there is circumferential bladder wall thickening which could be due to under distension.   Stomach/Bowel: Postsurgical  changes of sigmoid colectomy with Hartmann's pouch and left lower quadrant colostomy. There is no evidence of bowel obstruction. There is a bowel and ascites containing right inguinal hernia, including the cecum and appendix. The appendix is decompressed and normal in appearance.   Vascular/Lymphatic: Extensive aortoiliac atherosclerosis. No AAA. No lymphadenopathy.   Reproductive: Unremarkable.   Other: Increased, moderate to large volume ascites.   Musculoskeletal: No acute osseous abnormality. No suspicious osseous lesion. Chronic bilateral L5 pars defects with grade 1 anterolisthesis at L5-S1 and mild disc bulging. Bilateral hip osteoarthritis.   IMPRESSION: Persistent bilateral hydronephrosis and proximal hydroureter after bladder decompression, without obstructing lesion identified on noncontrast CT.   Increased, moderate to large volume ascites.   Postsurgical changes of sigmoid colectomy and left lower quadrant colostomy.   Large right inguinal hernia containing ascites, distal ileum, cecum and appendix.   Unchanged chronic calcific pancreatitis with probable 15 mm pseudocyst.   Small bilateral pleural effusions, right greater than left.     Electronically Signed   By: Maurine Simmering M.D.   On: 07/10/2022 09:07 Assessment:   Sigmoid volvulus, s/p decompression colonoscopy, subsequent Hartman's procedure, was pending SNF placement but started developing issues noted below last couple days:  Urine with more hematuria today.  Cr decreasing with IVF.  [facility] recs.  CT ordered per nephro and results noted as above.  Hospitalist consulted for ascites and pleural effusion.  Urology also on board now due to peristent hydronephrosis per nephro recs.  Heparin discontinued for hematuria, anemia now.    SNF approval on hold per LCSW.  Pt officially off hospice services, [facility] will become active 07/14/22.    Surgery likely to transition care over to hospitalist  over next day or so.  Patient will follow-up in the surgery office for Ostomy care as needed.  No lifting greater than 10 pounds, no push/pull greater than 30 pounds for 4 weeks postop.  4-6 months post op, if patient is healthy enough, can consider ostomy reversal.    labs/images/medications/previous chart entries reviewed personally and relevant changes/updates noted above.

## 2022-07-10 NOTE — Consult Note (Signed)
Medicine Consult Note  Carl Bell UKG:[id number] DOB: 04/13/1951 DOA: 06/22/2022  PCP: Kirk Ruths, MD   I have personally briefly reviewed patient's old medical records in [place].  Consulting service: General surgery, Dr. Lysle Pearl Consult Concern: Acute kidney injury and anasarca  HPI: Taken from prior notes.  Carl Bell is a 71 y.o. male with past medical history significant for COPD with chronic respiratory failure on 2 L of oxygen, hypertension, history of lung cancer status post chemoradiation therapy, history of liver cirrhosis and hypertension who presents to the ER for evaluation of a 3-day history of constipation associated with diffuse abdominal pain with localization to the periumbilical area rated an 8 x 10 in intensity at its worst.  There is no radiation of the pain but it is associated with nausea and vomiting.  He admits to a 74-monthunintentional 50 pound weight loss.  He denies having any alleviating or aggravating factors.   CT scan of abdomen and pelvis which showed high-grade distal colonic obstruction due to sigmoid volvulus. Large right groin hernia containing cecum, ileum, and ascites. Chronic calcific pancreatitis.  14 mm presumed pseudocyst. Bilateral complicated renal cysts that are size stable from a PET CT in 2017. Advanced atherosclerosis.   Patient has been admitted to the surgical service and medical consult requested to manage patient's multiple comorbidities.   S/p sigmoid colectomy on 06/23/2022.  12/12: Hemodynamically stable but labs pertinent for potassium of 7.1-ordered EKG, calcium gluconate, albuterol and insulin per hyperkalemia order set protocol.  Patient is n.p.o. so cannot use Lokelma or Kayexalate.  Apparently patient was on IV infusion with potassium which was immediately discontinued. Magnesium was also low at 1.6-replating.  CBC with decrease of hemoglobin to 9.2 postoperatively, checking anemia panel, which shows anemia of  chronic disease with some iron deficiency. Ordered IV iron as patient is n.p.o. Patient was started on clear liquid diet after having stool in colostomy bag.  12/13: Hemodynamically stable, hemoglobin started improving to 9.9, hyperkalemia resolved with potassium of 4.5 and magnesium of 2.2 today.  Overnight leakage from wound which is being managed by surgical team.  Might need to repeat imaging.  12/14: Patient appears more lethargic today.  Apparently received Ativan after midnight due to agitation and pulling on lines.  Arousable and denies any pain, going back to sleep pretty quickly.  Repeat labs seems stable.  12/15: Patient again became agitated overnight and 1 dose of Ativan 0.5 mg given.  Starting him on Seroquel 25 mg daily at bedtime.  Diet was advanced to regular but p.o. intake remained poor.  12/16: Patient with no new concern.  Family decided to take him home with home health services instead of rehab.  Patient started eating with the help of family.  Surgery and TOC to make arrangement.  No further need for internal medicine so we will sign off.  Patient seems very high risk for deterioration and mortality based on underlying comorbidities and poor functional status.  PT/OT are recommending SNF.  On 07/08/2022: It was noted by primary service, the patient developed acute kidney injury with serum creatinine of 3.63, EGFR 17.  At baseline patient was noted to have normal renal function without prior diagnosis of CKD. - Medicine was consulted and admitting hospitalist recommended primary team to consult nephrology - Nephrology was consulted and recommended Foley placement and follow-up imaging with CT abdomen and pelvis without contrast  07/10/2022: Renal function improving.  Foley in place.  CT abdomen and pelvis  without contrast revealed generalized anasarca with abdominal ascites -[facility] hospitalist reconsulted --------------------  Patient was able to tell me his name, his  age,current calendar year.  He does not appear to be in acute distress.  He denies pain, nausea, vomiting, dysuria, hematuria.  He reports good appetite.  He reports that he did not notice any dysuria, hematuria or difficulty urinating.  Social history: He states he lives at home with his mother.  He endorses tobacco use and infrequent EtOH use.  ROS: Constitutional: no weight change, no fever ENT/Mouth: no sore throat, no rhinorrhea Eyes: no eye pain, no vision changes Cardiovascular: no chest pain, no dyspnea,  no edema, no palpitations Respiratory: no cough, no sputum, no wheezing Gastrointestinal: no nausea, no vomiting, no diarrhea, no constipation Genitourinary: no urinary incontinence, no dysuria, no hematuria Musculoskeletal: no arthralgias, no myalgias Skin: no skin lesions, no pruritus, Neuro: no weakness, no loss of consciousness, no syncope Psych: no anxiety, no depression, no decrease appetite Heme/Lymph: no bruising, no bleeding  Assessment/Plan  Principal Problem:   Anasarca associated with disorder of kidney Active Problems:   AKI (acute kidney injury) (HCC)   Volvulus of intestine (HCC)   Acute postoperative anemia due to expected blood loss   Hyperkalemia   COPD (chronic obstructive pulmonary disease) (HCC)   Hypertension   Hypokalemia   Sigmoid volvulus (HCC)   Assessment and Plan:  * Anasarca associated with disorder of kidney - Etiology workup in progress, differentials include renal injury secondary to bilateral hydronephrosis in setting of hypoalbuminemia.  Less likely end-stage renal disease. - Urology has been consulted - Ultrasound-guided paracentesis has been ordered with lab study - As needed albumin has been ordered for hypotension post paracentesis with body fluid creatinine check for urine leak ordered - Repeat BMP with LFTs in the a.m.  AKI (acute kidney injury) ([place]) - With generalized anasarca - Patient may be developing end-stage renal  failure and may require dialysis however given bilateral hydronephrosis, urology has been consulted - Ultrasound-guided paracentesis ordered with lab study and as needed albumin - Will repeat BMP and LFTs in the a.m. - Appreciate further recommendations from urology and nephrology - [facility] hospitalist will continue to follow - Continue admission MedSurg, inpatient  History of iron deficiency anemia,  Hemoglobin decreased to 9.2>>9.9>>10.4 postsurgically.  Anemia panel with anemia of chronic disease with some iron deficiency. - Received IV iron on 06/24/22 - Ordered one-time dose of Venofer - Transfuse if below 7 - Recheck cbc in the AM  Volvulus of intestine [facility]) S/p sigmoid colectomy.  Having bowel movement in colostomy bag. - Management by general surgery  Chart reviewed.   DVT prophylaxis: Per primary, pharmacologic DVT prophylaxis avoid at this time due to worsening anemia Diet: Dietary consulted Disposition Plan: Per primary  Past Medical History:  Diagnosis Date   COPD (chronic obstructive pulmonary disease) ([place])    Emphysema of lung ([place])    Glaucoma    Hypertension    Lung cancer ([place])    Past Surgical History:  Procedure Laterality Date   COLON RESECTION SIGMOID N/A 06/23/2022   Procedure: COLON RESECTION SIGMOID;  Surgeon: [name], DO;  Location: [facility];  Service: General;  Laterality: N/A;   COLONOSCOPY N/A 06/22/2022   Procedure: COLONOSCOPY;  Surgeon: [name], DO;  Location: [place] [facility];  Service: General;  Laterality: N/A;   COLOSTOMY  06/23/2022   Procedure: COLOSTOMY;  Surgeon: [name], DO;  Location: [facility];  Service: General;;   LOWER EXTREMITY ANGIOGRAPHY  Left 07/11/2019   Procedure: LOWER EXTREMITY ANGIOGRAPHY;  Surgeon: Algernon Huxley, MD;  Location: [place] [facility];  Service: Cardiovascular;  Laterality: Left;   LUNG BIOPSY     PERIPHERAL VASCULAR CATHETERIZATION N/A 09/24/2015   Procedure: [name];  Surgeon:  Algernon Huxley, MD;  Location: [place] [facility];  Service: Cardiovascular;  Laterality: N/A;   Port a cath placement     Social History:  reports that he has been smoking cigarettes. He has a 25.00 pack-year smoking history. He has never used smokeless tobacco. He reports current alcohol use. He reports that he does not use drugs.  No Known Allergies Family History  Problem Relation Age of Onset   Diabetes Mellitus II Mother    Hypertension Father    Family history: Family history reviewed and not pertinent  Prior to Admission medications   Medication Sig Start Date End Date Taking? Authorizing Provider  dorzolamide-timolol (COSOPT) 22.3-6.8 MG/ML ophthalmic solution Place 1 drop into both eyes 2 (two) times daily. 07/10/20  Yes [provider]  latanoprost (XALATAN) 0.005 % ophthalmic solution Place 1 drop into both eyes at bedtime. 07/10/20  Yes [provider]  albuterol (VENTOLIN HFA) 108 (90 Base) MCG/ACT inhaler Inhale 2 puffs into the lungs every 6 (six) hours as needed for wheezing or shortness of breath. Patient not taking: Reported on 06/22/2022 03/20/22   Blake Divine, MD  dextromethorphan-guaiFENesin [facility] DM) 30-600 MG 12hr tablet Take 1 tablet by mouth 2 (two) times daily. Patient not taking: Reported on 12/03/2021 07/08/21   Lorella Nimrod, MD  furosemide (LASIX) 40 MG tablet Take by mouth. Patient not taking: Reported on 12/03/2021 09/25/21 09/25/22  [provider]  ipratropium-albuterol (DUONEB) 0.5-2.5 (3) MG/3ML SOLN Take 3 mLs by nebulization every 6 (six) hours as needed (Shortness of breath). Patient not taking: Reported on 06/22/2022 07/08/21   Lorella Nimrod, MD  mirtazapine (REMERON) 7.5 MG tablet Take by mouth. Patient not taking: Reported on 06/22/2022 10/23/21 10/23/22  [provider]  nicotine (NICODERM CQ - DOSED IN MG/24 HR) 7 mg/24hr patch Place 1 patch (7 mg total) onto the skin daily. Patient not taking: Reported on  12/03/2021 09/16/21   Lorella Nimrod, MD  polyethylene glycol (MIRALAX / GLYCOLAX) 17 g packet Take 17 g by mouth daily. 07/01/22   Lysle Pearl, Isami, DO  potassium chloride (KLOR-CON) 10 MEQ tablet Take by mouth. Patient not taking: Reported on 06/22/2022 09/25/21 09/25/22  [provider]  predniSONE (DELTASONE) 10 MG tablet Take 5 tablets (50 mg total) by mouth daily. For 3 days, then take 4 tablets for next 3 days, 3 tablets for 3 days, 2 tablets for 3 days, 1 tablet for 3 days Patient not taking: Reported on 12/03/2021 09/16/21   Lorella Nimrod, MD  TRELEGY ELLIPTA 100-62.5-25 MCG/ACT AEPB Inhale 1 puff into the lungs daily. Patient not taking: Reported on 06/22/2022 05/10/21   [provider]   Physical Exam: Vitals:   07/10/22 0747 07/10/22 1448 07/10/22 1604 07/10/22 1810  BP: 133/84 123/74 122/72 127/89  Pulse: 79 85 65 92  Resp: 16  16   Temp: (!) 97.5 F (36.4 C)  (!) 97.5 F (36.4 C)   TempSrc: Oral  Oral   SpO2: 100% 100% 95%   Weight:      Height:       Constitutional: appears frail, cachectic, malnourished, NAD, calm, comfortable Eyes: PERRL, lids and conjunctivae normal ENMT: Mucous membranes are moist. Posterior pharynx clear of any  exudate or lesions. Age-appropriate dentition. Hearing appropriate Neck: normal, supple, no masses, no thyromegaly Respiratory: clear to auscultation bilaterally, no wheezing, no crackles. Normal respiratory effort. No accessory muscle use.  Cardiovascular: Regular rate and rhythm, no murmurs / rubs / gallops. No extremity edema. 2+ pedal pulses. No carotid bruits.  Abdomen: Scaphoid abdomen, no tenderness, no masses palpated, no hepatosplenomegaly. Bowel sounds positive.  Musculoskeletal: no clubbing / cyanosis. No joint deformity upper and lower extremities. Good ROM, no contractures, no atrophy. Normal muscle tone.  Skin: no rashes, lesions, ulcers. No induration Neurologic: Sensation intact. Strength 5/5 in all 4.  Psychiatric:  Normal judgment and insight. Alert and oriented x 3. Normal mood.   Chest x-ray on Admission: Not indicated at this time  US Paracentesis  Result Date: 07/10/2022 INDICATION: 71 year old gentleman with acute kidney injury and abdominal ascites presents to IR for paracentesis EXAM: ULTRASOUND GUIDED  PARACENTESIS MEDICATIONS: None. COMPLICATIONS: None immediate. PROCEDURE: Informed written consent was obtained from the patient after a discussion of the risks, benefits and alternatives to treatment. A timeout was performed prior to the initiation of the procedure. Initial ultrasound scanning demonstrates a moderate amount of ascites within the right lower abdominal quadrant. The right lower abdomen was prepped and draped in the usual sterile fashion. 1% lidocaine was used for local anesthesia. Following this, a 19 gauge, 7-cm, Yueh catheter was introduced. An ultrasound image was saved for documentation purposes. The paracentesis was performed. The catheter was removed and a dressing was applied. The patient tolerated the procedure well without immediate post procedural complication. FINDINGS: A total of approximately 2.1 L of straw-colored fluid was removed. Samples were sent to the laboratory as requested by the clinical team. IMPRESSION: Successful ultrasound-guided paracentesis yielding 2.1 liters of peritoneal fluid. Electronically Signed   By: Miachel Roux M.D.   On: 07/10/2022 16:29   CT ABDOMEN PELVIS WO CONTRAST  Result Date: 07/10/2022 CLINICAL DATA:  Hydronephrosis EXAM: CT ABDOMEN AND PELVIS WITHOUT CONTRAST TECHNIQUE: Multidetector CT imaging of the abdomen and pelvis was performed following the standard protocol without IV contrast. RADIATION DOSE REDUCTION: This exam was performed according to the departmental dose-optimization program which includes automated exposure control, adjustment of the mA and/or kV according to patient size and/or use of iterative reconstruction technique.  COMPARISON:  CT 06/22/2022 FINDINGS: Lower chest: Small bilateral pleural effusions with adjacent atelectasis, right greater than left. Hepatobiliary: No focal liver abnormality is seen. Gallbladder is unremarkable. Pancreas: Diffuse pancreatic calcifications with antrum and mild ductal dilation and a pancreatic tail cyst measuring up to 1.5 cm, unchanged. Spleen: Normal in size without focal abnormality. Adrenals/Urinary Tract: Adrenal glands are unremarkable. Unchanged partially calcified renal cysts. There is persistent bilateral hydronephrosis, left greater than right. Pelvic dilation is in part related to an extrarenal pelvis bilaterally. Persistent proximal hydroureter which tapers and appears decompressed distally. Bladder is decompressed with Foley catheter in place and associated intraluminal gas there is circumferential bladder wall thickening which could be due to under distension. Stomach/Bowel: Postsurgical changes of sigmoid colectomy with Hartmann's pouch and left lower quadrant colostomy. There is no evidence of bowel obstruction. There is a bowel and ascites containing right inguinal hernia, including the cecum and appendix. The appendix is decompressed and normal in appearance. Vascular/Lymphatic: Extensive aortoiliac atherosclerosis. No AAA. No lymphadenopathy. Reproductive: Unremarkable. Other: Increased, moderate to large volume ascites. Musculoskeletal: No acute osseous abnormality. No suspicious osseous lesion. Chronic bilateral L5 pars defects with grade 1 anterolisthesis at L5-S1 and mild disc bulging. Bilateral hip osteoarthritis.  IMPRESSION: Persistent bilateral hydronephrosis and proximal hydroureter after bladder decompression, without obstructing lesion identified on noncontrast CT. Increased, moderate to large volume ascites. Postsurgical changes of sigmoid colectomy and left lower quadrant colostomy. Large right inguinal hernia containing ascites, distal ileum, cecum and appendix.  Unchanged chronic calcific pancreatitis with probable 15 mm pseudocyst. Small bilateral pleural effusions, right greater than left. Electronically Signed   By: Maurine Simmering M.D.   On: 07/10/2022 09:07   US RENAL  Result Date: 07/09/2022 CLINICAL DATA:  Acute kidney injury EXAM: RENAL / URINARY TRACT ULTRASOUND COMPLETE COMPARISON:  CT 06/22/2022 FINDINGS: Right Kidney: Renal measurements: 10.7 by 4.3 x 4.9 cm = volume: 119 mL. Moderate to severe right hydronephrosis. Complex cystic lesion in the lower pole of the right kidney measures 1.6 x 1.4 x 1.1 cm. Normal echotexture. Left Kidney: Renal measurements: 10.1 x 4.9 x 4.2 cm = volume: 109 mL. Moderate to severe left hydronephrosis. Shadowing from the previously seen rim calcified lesion in the midpole of the left kidney. Normal echotexture. Bladder: Appears normal for degree of bladder distention. Other: Ascites noted in the abdomen and pelvis. IMPRESSION: Moderate to severe bilateral hydronephrosis, new since prior CT. Previously seen rim calcified lesions in both kidneys are noted but difficult to characterize due to shadowing from the calcifications. Moderate ascites. Electronically Signed   By: Rolm Baptise M.D.   On: 07/09/2022 15:25    Labs on Admission: I have personally reviewed following labs CBC: Recent Labs  Lab 07/08/22 2036 07/10/22 0522  WBC 5.8 4.0  HGB 9.3* 7.8*  HCT 28.3* 23.6*  MCV 73.7* 74.4*  PLT 298 854   Basic Metabolic Panel: Recent Labs  Lab 07/08/22 2036 07/10/22 0522  NA 135 139  K 4.7 4.1  CL 101 108  CO2 27 25  GLUCOSE 97 101*  BUN 52* 49*  CREATININE 3.63* 2.14*  CALCIUM 8.3* 8.3*   GFR: Estimated Creatinine Clearance: 24.3 mL/min (A) (by C-G formula based on SCr of 2.14 mg/dL (H)).  Liver Function Tests: Recent Labs  Lab 07/10/22 0522  AST 16  ALT 9  ALKPHOS 50  BILITOT 0.6  PROT 4.6*  ALBUMIN 2.2*   CBG: Recent Labs  Lab 07/09/22 1642 07/09/22 2108 07/10/22 0743 07/10/22 1140  07/10/22 1643  GLUCAP 69* 72 81 86 103*   Urine analysis:    Component Value Date/Time   COLORURINE BROWN (A) 07/08/2022 1714   APPEARANCEUR HAZY (A) 07/08/2022 1714   LABSPEC 1.012 07/08/2022 1714   PHURINE 8.0 07/08/2022 1714   GLUCOSEU NEGATIVE 07/08/2022 1714   HGBUR LARGE (A) 07/08/2022 1714   BILIRUBINUR NEGATIVE 07/08/2022 1714   KETONESUR NEGATIVE 07/08/2022 1714   PROTEINUR >=300 (A) 07/08/2022 1714   NITRITE NEGATIVE 07/08/2022 1714   LEUKOCYTESUR NEGATIVE 07/08/2022 1714   This document was prepared using Dragon Voice Recognition software and may include unintentional dictation errors.  Dr. Tobie Poet [facility]  If 7PM-7AM, please contact overnight-coverage provider If 7AM-7PM, please contact day coverage provider [email or web address]  07/10/2022, 6:49 PM

## 2022-07-10 NOTE — Progress Notes (Signed)
[facility]  ROUNDING NOTE   Subjective:   Patient resting in bed Alert and oriented Continues to complain of intermittent nausea No lower extremity edema Room air  Objective:  Vital signs in last 24 hours:  Temp:  [97.5 F (36.4 C)-98.1 F (36.7 C)] 97.5 F (36.4 C) (12/28 0747) Pulse Rate:  [79-110] 79 (12/28 0747) Resp:  [16-18] 16 (12/28 0747) BP: (129-146)/(84-97) 133/84 (12/28 0747) SpO2:  [95 %-100 %] 100 % (12/28 0747)  Weight change:  Filed Weights   07/02/22 0500 07/06/22 0439 07/07/22 0500  Weight: 54.7 kg 55 kg 55 kg    Intake/Output: I/O last 3 completed shifts: In: 388.9 [I.V.:388.9] Out: 1700 [Urine:1400; Stool:300]   Intake/Output this shift:  Total I/O In: -  Out: 650 [Urine:650]  Physical Exam: General: NAD  Head: Normocephalic, atraumatic. Moist oral mucosal membranes  Eyes: Anicteric  Lungs:  Clear to auscultation normal effort  Heart: Regular rate and rhythm  Abdomen:  Soft, nontender  Extremities: No peripheral edema.  Neurologic: Alert and oriented, moving all four extremities  Skin: No lesions, dry  Access: None    Basic Metabolic Panel: Recent Labs  Lab 07/08/22 2036 07/10/22 0522  NA 135 139  K 4.7 4.1  CL 101 108  CO2 27 25  GLUCOSE 97 101*  BUN 52* 49*  CREATININE 3.63* 2.14*  CALCIUM 8.3* 8.3*    Liver Function Tests: Recent Labs  Lab 07/10/22 0522  AST 16  ALT 9  ALKPHOS 50  BILITOT 0.6  PROT 4.6*  ALBUMIN 2.2*   No results for input(s): "LIPASE", "AMYLASE" in the last 168 hours. No results for input(s): "AMMONIA" in the last 168 hours.  CBC: Recent Labs  Lab 07/08/22 2036 07/10/22 0522  WBC 5.8 4.0  HGB 9.3* 7.8*  HCT 28.3* 23.6*  MCV 73.7* 74.4*  PLT 298 228    Cardiac Enzymes: No results for input(s): "CKTOTAL", "CKMB", "CKMBINDEX", "TROPONINI" in the last 168 hours.  BNP: Invalid input(s): "POCBNP"  CBG: Recent Labs  Lab 07/09/22 1137 07/09/22 1642 07/09/22 2108  07/10/22 0743 07/10/22 1140  GLUCAP 79 69* 72 81 86    Microbiology: Results for orders placed or performed during the hospital encounter of 04/13/22  Resp Panel by RT-PCR (Flu A&B, Covid) Anterior Nasal Swab     Status: None   Collection Time: 04/13/22  8:23 PM   Specimen: Anterior Nasal Swab  Result Value Ref Range Status   SARS Coronavirus 2 by RT PCR NEGATIVE NEGATIVE Final    Comment: (NOTE) SARS-CoV-2 target nucleic acids are NOT DETECTED.  The SARS-CoV-2 RNA is generally detectable in upper respiratory specimens during the acute phase of infection. The lowest concentration of SARS-CoV-2 viral copies this assay can detect is 138 copies/mL. A negative result does not preclude SARS-Cov-2 infection and should not be used as the sole basis for treatment or other patient management decisions. A negative result may occur with  improper specimen collection/handling, submission of specimen other than nasopharyngeal swab, presence of viral mutation(s) within the areas targeted by this assay, and inadequate number of viral copies(<138 copies/mL). A negative result must be combined with clinical observations, patient history, and epidemiological information. The expected result is Negative.  Fact Sheet for Patients:  EntrepreneurPulse.com.au  Fact Sheet for Healthcare Providers:  IncredibleEmployment.be  This test is no t yet approved or cleared by the [place] FDA and  has been authorized for detection and/or diagnosis of SARS-CoV-2 by FDA under an Emergency Use Authorization (  EUA). This EUA will remain  in effect (meaning this test can be used) for the duration of the COVID-19 declaration under Section 564(b)(1) of the Act, 21 U.S.C.section 360bbb-3(b)(1), unless the authorization is terminated  or revoked sooner.       Influenza A by PCR NEGATIVE NEGATIVE Final   Influenza B by PCR NEGATIVE NEGATIVE Final    Comment: (NOTE) The  Xpert Xpress SARS-CoV-2/FLU/RSV plus assay is intended as an aid in the diagnosis of influenza from Nasopharyngeal swab specimens and should not be used as a sole basis for treatment. Nasal washings and aspirates are unacceptable for Xpert Xpress SARS-CoV-2/FLU/RSV testing.  Fact Sheet for Patients: EntrepreneurPulse.com.au  Fact Sheet for Healthcare Providers: IncredibleEmployment.be  This test is not yet approved or cleared by the [place] FDA and has been authorized for detection and/or diagnosis of SARS-CoV-2 by FDA under an Emergency Use Authorization (EUA). This EUA will remain in effect (meaning this test can be used) for the duration of the COVID-19 declaration under Section 564(b)(1) of the Act, 21 U.S.C. section 360bbb-3(b)(1), unless the authorization is terminated or revoked.  Performed at [facility], [address] [place], [place] [address]     Coagulation Studies: No results for input(s): "LABPROT", "INR" in the last 72 hours.  Urinalysis: Recent Labs    07/08/22 1714  [place] 1.012  PHURINE 8.0  GLUCOSEU NEGATIVE  HGBUR LARGE*  BILIRUBINUR NEGATIVE  KETONESUR NEGATIVE  PROTEINUR >=300*  NITRITE NEGATIVE  LEUKOCYTESUR NEGATIVE      Imaging: CT ABDOMEN PELVIS WO CONTRAST  Result Date: 07/10/2022 CLINICAL DATA:  Hydronephrosis EXAM: CT ABDOMEN AND PELVIS WITHOUT CONTRAST TECHNIQUE: Multidetector CT imaging of the abdomen and pelvis was performed following the standard protocol without IV contrast. RADIATION DOSE REDUCTION: This exam was performed according to the departmental dose-optimization program which includes automated exposure control, adjustment of the mA and/or kV according to patient size and/or use of iterative reconstruction technique. COMPARISON:  CT 06/22/2022 FINDINGS: Lower chest: Small bilateral pleural effusions with adjacent atelectasis, right greater than left.  Hepatobiliary: No focal liver abnormality is seen. Gallbladder is unremarkable. Pancreas: Diffuse pancreatic calcifications with antrum and mild ductal dilation and a pancreatic tail cyst measuring up to 1.5 cm, unchanged. Spleen: Normal in size without focal abnormality. Adrenals/Urinary Tract: Adrenal glands are unremarkable. Unchanged partially calcified renal cysts. There is persistent bilateral hydronephrosis, left greater than right. Pelvic dilation is in part related to an extrarenal pelvis bilaterally. Persistent proximal hydroureter which tapers and appears decompressed distally. Bladder is decompressed with Foley catheter in place and associated intraluminal gas there is circumferential bladder wall thickening which could be due to under distension. Stomach/Bowel: Postsurgical changes of sigmoid colectomy with Hartmann's pouch and left lower quadrant colostomy. There is no evidence of bowel obstruction. There is a bowel and ascites containing right inguinal hernia, including the cecum and appendix. The appendix is decompressed and normal in appearance. Vascular/Lymphatic: Extensive aortoiliac atherosclerosis. No AAA. No lymphadenopathy. Reproductive: Unremarkable. Other: Increased, moderate to large volume ascites. Musculoskeletal: No acute osseous abnormality. No suspicious osseous lesion. Chronic bilateral L5 pars defects with grade 1 anterolisthesis at L5-S1 and mild disc bulging. Bilateral hip osteoarthritis. IMPRESSION: Persistent bilateral hydronephrosis and proximal hydroureter after bladder decompression, without obstructing lesion identified on noncontrast CT. Increased, moderate to large volume ascites. Postsurgical changes of sigmoid colectomy and left lower quadrant colostomy. Large right inguinal hernia containing ascites, distal ileum, cecum and appendix. Unchanged chronic calcific pancreatitis with probable 15 mm pseudocyst.  Small bilateral pleural effusions, right greater than left.  Electronically Signed   By: Maurine Simmering M.D.   On: 07/10/2022 09:07   US RENAL  Result Date: 07/09/2022 CLINICAL DATA:  Acute kidney injury EXAM: RENAL / URINARY TRACT ULTRASOUND COMPLETE COMPARISON:  CT 06/22/2022 FINDINGS: Right Kidney: Renal measurements: 10.7 by 4.3 x 4.9 cm = volume: 119 mL. Moderate to severe right hydronephrosis. Complex cystic lesion in the lower pole of the right kidney measures 1.6 x 1.4 x 1.1 cm. Normal echotexture. Left Kidney: Renal measurements: 10.1 x 4.9 x 4.2 cm = volume: 109 mL. Moderate to severe left hydronephrosis. Shadowing from the previously seen rim calcified lesion in the midpole of the left kidney. Normal echotexture. Bladder: Appears normal for degree of bladder distention. Other: Ascites noted in the abdomen and pelvis. IMPRESSION: Moderate to severe bilateral hydronephrosis, new since prior CT. Previously seen rim calcified lesions in both kidneys are noted but difficult to characterize due to shadowing from the calcifications. Moderate ascites. Electronically Signed   By: Rolm Baptise M.D.   On: 07/09/2022 15:25     Medications:    sodium chloride 100 mL/hr at 07/10/22 1031    Chlorhexidine Gluconate Cloth  6 each Topical Daily   dorzolamide-timolol  1 drop Both Eyes BID   feeding supplement  237 mL Oral TID BM   fluticasone furoate-vilanterol  1 puff Inhalation Daily   And   umeclidinium bromide  1 puff Inhalation Daily   latanoprost  1 drop Both Eyes QHS   multivitamin with minerals  1 tablet Oral Daily   polyethylene glycol  17 g Oral Daily   QUEtiapine  25 mg Oral QHS   albuterol, docusate sodium, hydrALAZINE, HYDROcodone-acetaminophen, ipratropium-albuterol, ondansetron **OR** ondansetron (ZOFRAN) IV, sodium chloride flush, traMADol  Assessment/ Plan:  Mr. YUL DIANA is a 71 y.o.  male with past medical history of COPD, glaucoma, hypertension, and lung cancer, who was admitted to [facility] on 06/22/2022 for Small bowel obstruction ([place])  [K56.609] Volvulus of intestine ([place]) [K56.2] Sigmoid volvulus ([place]) [K56.2]   Acute kidney injury likely secondary to moderate to severe bilateral hydronephrosis seen on renal ultrasound.  Normal renal function noted on admission.  IV contrast exposure on admission.   Renal function has improved with foley catheter placement.  CT abdomen pelvis shows persistent bilateral hydronephrosis with proximal hydroureter.  Will recommend urology consult for evaluation.  No indication for dialysis at this time.  Lab Results  Component Value Date   CREATININE 2.14 (H) 07/10/2022   CREATININE 3.63 (H) 07/08/2022   CREATININE 0.66 06/26/2022    Intake/Output Summary (Last 24 hours) at 07/10/2022 1222 Last data filed at 07/10/2022 1030 Gross per 24 hour  Intake 388.91 ml  Output 2200 ml  Net -1811.09 ml   2. Anemia of chronic kidney disease Lab Results  Component Value Date   HGB 7.8 (L) 07/10/2022    Hgb decreased since yesterday. Hematuria noted in foley. Will continue to monitor closely.  3. Hypertension, essential. Home regimen includes furosemide, currently held in setting of acute kidney injury. Blood pressure stable     LOS: [place] 12/28/202312:22 PM

## 2022-07-10 NOTE — Plan of Care (Signed)
  Problem: Education: Goal: Knowledge of General Education information will improve Description: Including pain rating scale, medication(s)/side effects and non-pharmacologic comfort measures Outcome: Not Progressing   Problem: Health Behavior/Discharge Planning: Goal: Ability to manage health-related needs will improve Outcome: Not Progressing   Problem: Clinical Measurements: Goal: Ability to maintain clinical measurements within normal limits will improve Outcome: Progressing Goal: Will remain free from infection Outcome: Progressing Goal: Diagnostic test results will improve Outcome: Progressing Goal: Respiratory complications will improve Outcome: Progressing Goal: Cardiovascular complication will be avoided Outcome: Progressing   Problem: Nutrition: Goal: Adequate nutrition will be maintained Outcome: Progressing   Problem: Coping: Goal: Level of anxiety will decrease Outcome: Progressing   Problem: Pain Managment: Goal: General experience of comfort will improve Outcome: Progressing   Problem: Safety: Goal: Ability to remain free from injury will improve Outcome: Progressing   Problem: Skin Integrity: Goal: Risk for impaired skin integrity will decrease Outcome: Progressing

## 2022-07-10 NOTE — Assessment & Plan Note (Signed)
-   With generalized anasarca - Patient may be developing end-stage renal failure and may require dialysis however given bilateral hydronephrosis, urology has been consulted - Ultrasound-guided paracentesis ordered with lab study and as needed albumin - Will repeat BMP and LFTs in the a.m. - [facility] hospitalist will continue to follow - Continue admission MedSurg, inpatient

## 2022-07-10 NOTE — Assessment & Plan Note (Addendum)
-   Etiology workup in progress, differentials include hypoalbuminemia versus bilateral hydronephrosis versus developing end-stage renal failure - Urology has been consulted - Ultrasound-guided paracentesis has been ordered with lab study - As needed albumin has been ordered for hypotension post paracentesis with body fluid creatinine check for urine leak ordered - Repeat BMP with LFTs in the a.m.

## 2022-07-10 NOTE — Consult Note (Signed)
    Urology asked to comment on bilateral hydronephrosis and AKI  71 year old male who was hospitalized 06/22/2022 with sigmoid volvulus, and underwent Hartmann procedure with Dr. Lysle Pearl on 06/23/2022.  Renal function postoperatively remained normal with creatinine of 0.66, EGFR greater than 60 on 06/26/2022.  Labs were checked again on 07/08/2022 and creatinine had increased to 3.63 which prompted a renal ultrasound showing bilateral moderate to severe hydronephrosis.  A Foley catheter was placed on 07/09/2022.  A CT was ordered today that showed moderate ascites, bilateral persistent moderate hydronephrosis with ureteral dilation down towards the bladder, no evidence of stones, and Foley catheter in appropriate position and a decompressed bladder.  I personally viewed and interpreted those images.  Renal function improved after catheter placement down to 2.14, EGFR 32.  Possible etiologies include urinary retention with upstream hydronephrosis, now improving after catheter placement, it can take at least a few days for hydronephrosis to completely resolve after catheter placement.  Other alternative would be injury to the urinary tract either ureters or bladder from prior surgery, though lower suspicion with normal renal function postoperatively, and new AKI 2 weeks later.  -Continue to trend renal function -Primary team ordered paracentesis, please send fluid for creatinine to evaluate for urine leak/urologic injury -If renal function normalizes with catheter placement, okay to start Flomax and follow-up in urology clinic in 1 to 2 weeks for voiding trial  Billey Co, MD

## 2022-07-10 NOTE — Progress Notes (Signed)
Patient return from paracentesis, site on LLQ clean, dry, intact. Patient endorses no pain or discomfort. Vs within normal parameters. Albumin in place as needed for hypotension.

## 2022-07-10 NOTE — Plan of Care (Signed)

## 2022-07-10 NOTE — Progress Notes (Addendum)
Nutrition Follow Up Note   DOCUMENTATION CODES:   Severe malnutrition in context of chronic illness  INTERVENTION:   Ensure Enlive po TID, each supplement provides 350 kcal and 20 grams of protein.  Magic cup TID with meals, each supplement provides 290 kcal and 9 grams of protein  MVI po daily   Pt at high refeed risk; recommend monitor potassium, magnesium and phosphorus labs daily until stable  Daily weights   NUTRITION DIAGNOSIS:   Severe Malnutrition related to chronic illness as evidenced by severe fat depletion, severe muscle depletion, 27 percent weight loss in 9 months.  GOAL:   Patient will meet greater than or equal to 90% of their needs - not met   MONITOR:   PO intake, Supplement acceptance, Labs, Weight trends, Skin, I & O's  ASSESSMENT:   71 y.o. 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  RD received new consult for nutritional assessment.   Pt is well known to this RD from numerous previous admissions. Pt with chronic poor appetite and oral intake at baseline. Per chart, pt is down 43lbs(27%) since March; this is severe weight loss. Pt with intermittent poor appetite and oral intake since admission. Pt eating mainly sips and bites of meals but is documented to have eaten 75% of a couple of meals with assist. Pt is refusing most of the Ensure supplements. Pt with new anasarca; MD has ordered albumin. The only thing left that can be offered from a nutritional standpoint is G-tube and nutrition support. Pt would need to be able to care for tube and provide tube feeds for himself at home as his insurance does not cover long term care. Plan is for SNF at discharge. Pt previously on hospice care.     Per chart, pt is fairly weight stable since admission.    Medications reviewed and include: MVI, miralax  Labs reviewed: K 4.1 wnl, BUN 49(H), creat 2.14(H)  Diet Order:   Diet Order             Diet regular Room service appropriate? Yes; Fluid consistency: Thin  Diet effective now                  EDUCATION NEEDS:   Education needs have been addressed  Skin:  Skin Assessment: Reviewed RN Assessment (incision abdomen)  Last BM:  12/28- 110m via ostomy  Height:   Ht Readings from Last 1 Encounters:  06/23/22 _0  (1.854 m)    Weight:   Wt Readings from Last 1 Encounters:  07/07/22 55 kg    Ideal Body Weight:  83.6 kg  BMI:  Body mass index is 16 kg/m.  Estimated Nutritional Needs:   Kcal:  1700-2000kcal/day  Protein:  85-100g/day  Fluid:  1.4-1.6L/day  CKoleen DistanceMS, RD, LDN Please refer to [facility]for RD and/or RD on-call/weekend/after hours pager

## 2022-07-10 NOTE — TOC Progression Note (Signed)
Transition of Care [facility]) - Progression Note    Patient Details  Name: Carl Bell MRN: [id number] Date of Birth: 1951/03/26  Transition of Care [facility]) CM/SW Contact  Beverly Sessions, RN Phone Number: 07/10/2022, 2:42 PM  Clinical Narrative:     Per Vaughan Basta at [place] they have received the required documentation needed to show that hospice has been revoked.  She states that they pick back up coverage on 1/1 and insurance auth can be reinitiated then if patient is medically ready.   Christine with [facility] is aware.  TOC supervisor and TOC director in email chain with the above information  VM left for brother to update   Expected Discharge Plan: [place] Barriers to Discharge: Continued Medical Work up  Expected Discharge Plan and Services     Post Acute Care Choice:  (TBD) Living arrangements for the past 2 months: Single Family Home                           HH Arranged: RN, PT, OT [facility] Agency: [place] Date [place]: 06/28/22 Time [place]: [id number] Representative spoke with at [place]: [place] Determinants of Health ([place]) Interventions [place]: No Food Insecurity (06/30/2022)  Housing: Low Risk  (06/30/2022)  Transportation Needs: No Transportation Needs (06/30/2022)  Utilities: Not At Risk (06/30/2022)  Tobacco Use: High Risk (06/24/2022)    Readmission Risk Interventions    06/24/2022   11:08 AM  Readmission Risk Prevention Plan  Transportation Screening Complete  PCP or Specialist Appt within 3-5 Days Complete  Social Work Consult for [place] Planning/Counseling [place] Not Applicable  Medication Review Press photographer) Complete

## 2022-07-10 NOTE — Progress Notes (Addendum)
Pt's urine color turned from pink to red. Reeves Forth, MD was notified.

## 2022-07-11 ENCOUNTER — Inpatient Hospital Stay: Payer: Medicare Other

## 2022-07-11 DIAGNOSIS — N179 Acute kidney failure, unspecified: Secondary | ICD-10-CM | POA: Diagnosis not present

## 2022-07-11 DIAGNOSIS — J449 Chronic obstructive pulmonary disease, unspecified: Secondary | ICD-10-CM

## 2022-07-11 DIAGNOSIS — K562 Volvulus: Secondary | ICD-10-CM | POA: Diagnosis not present

## 2022-07-11 LAB — CBC
HCT: 24.9 % — ABNORMAL LOW (ref 39.0–52.0)
Hemoglobin: 8 g/dL — ABNORMAL LOW (ref 13.0–17.0)
MCH: 24 pg — ABNORMAL LOW (ref 26.0–34.0)
MCHC: 32.1 g/dL (ref 30.0–36.0)
MCV: 74.6 fL — ABNORMAL LOW (ref 80.0–100.0)
Platelets: 255 10*3/uL (ref 150–400)
RBC: 3.34 MIL/uL — ABNORMAL LOW (ref 4.22–5.81)
RDW: 16.5 % — ABNORMAL HIGH (ref 11.5–15.5)
WBC: 4.6 10*3/uL (ref 4.0–10.5)
nRBC: 0 % (ref 0.0–0.2)

## 2022-07-11 LAB — GLUCOSE, CAPILLARY
Glucose-Capillary: 94 mg/dL (ref 70–99)
Glucose-Capillary: 87 mg/dL (ref 70–99)
Glucose-Capillary: 120 mg/dL — ABNORMAL HIGH (ref 70–99)
Glucose-Capillary: 77 mg/dL (ref 70–99)

## 2022-07-11 LAB — FERRITIN: Ferritin: 244 ng/mL (ref 24–336)

## 2022-07-11 LAB — IRON AND TIBC
Iron: 159 ug/dL (ref 45–182)
Saturation Ratios: 92 % — ABNORMAL HIGH (ref 17.9–39.5)
TIBC: 172 ug/dL — ABNORMAL LOW (ref 250–450)
UIBC: 13 ug/dL

## 2022-07-11 LAB — BASIC METABOLIC PANEL
Anion gap: 4 — ABNORMAL LOW (ref 5–15)
BUN: 35 mg/dL — ABNORMAL HIGH (ref 8–23)
CO2: 23 mmol/L (ref 22–32)
Calcium: 8.2 mg/dL — ABNORMAL LOW (ref 8.9–10.3)
Chloride: 113 mmol/L — ABNORMAL HIGH (ref 98–111)
Creatinine, Ser: 1.29 mg/dL — ABNORMAL HIGH (ref 0.61–1.24)
GFR, Estimated: 59 mL/min — ABNORMAL LOW (ref >60)
Glucose, Bld: 102 mg/dL — ABNORMAL HIGH (ref 70–99)
Potassium: 4 mmol/L (ref 3.5–5.1)
Sodium: 140 mmol/L (ref 135–145)

## 2022-07-11 LAB — HEPATIC FUNCTION PANEL
ALT: 8 U/L (ref 0–44)
AST: 16 U/L (ref 15–41)
Albumin: 2.2 g/dL — ABNORMAL LOW (ref 3.5–5.0)
Alkaline Phosphatase: 44 U/L (ref 38–126)
Bilirubin, Direct: <0.1 mg/dL (ref 0.0–0.2)
Total Bilirubin: 0.5 mg/dL (ref 0.3–1.2)
Total Protein: 4.8 g/dL — ABNORMAL LOW (ref 6.5–8.1)

## 2022-07-11 LAB — PROCALCITONIN: Procalcitonin: <0.1 ng/mL

## 2022-07-11 LAB — PATHOLOGIST SMEAR REVIEW

## 2022-07-11 NOTE — Progress Notes (Signed)
   Peritoneal fluid creatinine yesterday 1.9 consistent with serum levels, and indicative of no urologic leak/injury  Creatinine continues to improve this morning with catheter drainage, 1.29(2.14)(3.63 prior to Foley)  Maintain Foley 2 weeks, okay to start Flomax from a urology perspective.  Will coordinate outpatient urology follow-up in 2 weeks for Foley removal and voiding trial.  Billey Co, MD

## 2022-07-11 NOTE — TOC Progression Note (Signed)
Transition of Care [facility]) - Progression Note    Patient Details  Name: Carl Bell MRN: [id number] Date of Birth: 17-Nov-1950  Transition of Care [facility]) CM/SW Contact  Beverly Sessions, RN Phone Number: 07/11/2022, 1:31 PM  Clinical Narrative:     VM  left for brother Gaspar Bidding to notify that we are still working on insurance auth and that it would have to be re started on 1/1   Expected Discharge Plan: [place] Barriers to Discharge: Continued Medical Work up  Expected Discharge Plan and Services     Post Acute Care Choice:  (TBD) Living arrangements for the past 2 months: Single Family Home                           HH Arranged: RN, PT, OT [facility] Agency: [place] Date [place]: 06/28/22 Time [place]: 1255 Representative spoke with at [place]: [place] Determinants of Health ([place]) Interventions [place]: No Food Insecurity (06/30/2022)  Housing: Low Risk  (06/30/2022)  Transportation Needs: No Transportation Needs (06/30/2022)  Utilities: Not At Risk (06/30/2022)  Tobacco Use: High Risk (06/24/2022)    Readmission Risk Interventions    06/24/2022   11:08 AM  Readmission Risk Prevention Plan  Transportation Screening Complete  PCP or Specialist Appt within 3-5 Days Complete  Social Work Consult for [place] Planning/Counseling [place] Not Applicable  Medication Review Press photographer) Complete

## 2022-07-11 NOTE — Progress Notes (Signed)
  Progress Note   Patient: Carl Bell KXF:[id number] DOB: [birth year] DOA: 06/22/2022     19 DOS: the patient was seen and examined on 07/11/2022   Brief hospital course: Taken from prior notes.  Carl Bell is a 71 y.o. male with past medical history significant for COPD with chronic respiratory failure on 2 L of oxygen, hypertension, history of lung cancer status post chemoradiation therapy, history of liver cirrhosis and hypertension who presents to the ER for evaluation of a 3-day history of constipation associated with diffuse abdominal pain.  CT scan of abdomen and pelvis which showed high-grade distal colonic obstruction due to sigmoid volvulus. Large right groin hernia containing cecum, ileum, and ascites. Chronic calcific pancreatitis.   Patient has been admitted to the surgical service and medical consult requested to manage patient's multiple comorbidities. S/p sigmoid colectomy on 06/23/2022.  Patient developed acute renal failure secondary to urinary retention 12/27.  Foley catheter is anchored, received fluids, renal function appears to be improving.  Patient also had a paracentesis removing 2.1 L of ascites.  Patient seems very high risk for deterioration and mortality based on underlying comorbidities and poor functional status.  PT/OT are recommending SNF.  Assessment and Plan: * Anasarca associated with disorder of kidney Liver cirrhosis with ascites. Patient no longer has any anasarca, patient had a 2.1 L of ascites removed.  Low albumin from ascites indicating portal hypertension. Liver ultrasound with Doppler obtained.   AKI (acute kidney injury) ([place]) secondary to obstruction. Benign prostate hypertrophy with urinary retention. Hyperkalemia resolved. Renal function has improved, discontinue IV fluids.  Recheck BMP tomorrow. Patient will be continued with a Foley catheter, follow-up with urology as outpatient.  Volvulus of intestine (HCC) S/p sigmoid colectomy.   Having bowel movement in colostomy bag.  Acute postoperative anemia due to expected blood loss Patient has a adequate iron, check a B12 level.  Hemoglobin stable  Hypertension Currently blood pressure within goal.   COPD (chronic obstructive pulmonary disease) ([place]) Chronic hypoxemic respiratory failure Patient was chronically on 2 L oxygen, condition stable.       Subjective:  Patient has occasional confusion, otherwise no complaints.  Has bowel movement from ostomy bag.  Physical Exam: Vitals:   07/10/22 1810 07/10/22 2007 07/11/22 0326 07/11/22 0804  BP: 127/89 119/70 119/75 115/65  Pulse: 92 91 84 80  Resp:  20 16 17   Temp:  98.2 F (36.8 C) 97.6 F (36.4 C) 98.1 F (36.7 C)  TempSrc:  Oral Oral   SpO2:  100% 100% 100%  Weight:      Height:       General exam: Appears calm and comfortable  Respiratory system: Clear to auscultation. Respiratory effort normal. Cardiovascular system: S1 & S2 heard, RRR. No JVD, murmurs, rubs, gallops or clicks. No pedal edema. Gastrointestinal system: Abdomen is nondistended, soft and nontender. No organomegaly or masses felt. Normal bowel sounds heard. Central nervous system: Alert and oriented. No focal neurological deficits. Extremities: Symmetric 5 x 5 power. Skin: No rashes, lesions or ulcers Psychiatry: Judgement and insight appear normal. Mood & affect appropriate.   Data Reviewed:  Reviewed lab results.  Family Communication: Sister updated at bedside.  Disposition: Status is: Inpatient Remains inpatient appropriate because: Per general surgery,  Planned Discharge Destination: Skilled nursing facility    Time spent: 35 minutes  Author: Sharen Hones, MD 07/11/2022 12:15 PM  For on call review [email or web address].

## 2022-07-11 NOTE — Progress Notes (Addendum)
[facility]  ROUNDING NOTE   Subjective:   Patient seen resting quietly, alert and oriented Remains on 2 L nasal cannula Poor appetite persist Denies pain or discomfort No lower extremity edema  Creatinine 1.29 Urine output 1.1 L recorded in preceding 24 hours.  Objective:  Vital signs in last 24 hours:  Temp:  [97.5 F (36.4 C)-98.2 F (36.8 C)] 98.1 F (36.7 C) (12/29 0804) Pulse Rate:  [65-92] 80 (12/29 0804) Resp:  [16-20] 17 (12/29 0804) BP: (115-127)/(65-89) 115/65 (12/29 0804) SpO2:  [95 %-100 %] 100 % (12/29 0804)  Weight change:  Filed Weights   07/02/22 0500 07/06/22 0439 07/07/22 0500  Weight: 54.7 kg 55 kg 55 kg    Intake/Output: I/O last 3 completed shifts: In: 3304.4 [P.O.:240; I.V.:3064.4] Out: 1600 [Urine:1450; Stool:150]   Intake/Output this shift:  Total I/O In: 416.1 [I.V.:416.1] Out: -   Physical Exam: General: NAD  Head: Normocephalic, atraumatic. Moist oral mucosal membranes  Eyes: Anicteric  Lungs:  Clear to auscultation normal effort  Heart: Regular rate and rhythm  Abdomen:  Soft, nontender  Extremities: No peripheral edema.  Neurologic: Alert and oriented, moving all four extremities  Skin: No lesions, dry  Access: None    Basic Metabolic Panel: Recent Labs  Lab 07/08/22 2036 07/10/22 0522 07/11/22 0659  NA 135 139 140  K 4.7 4.1 4.0  CL 101 108 113*  CO2 27 25 23   GLUCOSE 97 101* 102*  BUN 52* 49* 35*  CREATININE 3.63* 2.14* 1.29*  CALCIUM 8.3* 8.3* 8.2*     Liver Function Tests: Recent Labs  Lab 07/10/22 0522 07/10/22 1910 07/11/22 0659  AST 16  --  16  ALT 9  --  8  ALKPHOS 50  --  44  BILITOT 0.6 0.6 0.5  PROT 4.6*  --  4.8*  ALBUMIN 2.2*  --  2.2*    No results for input(s): "LIPASE", "AMYLASE" in the last 168 hours. No results for input(s): "AMMONIA" in the last 168 hours.  CBC: Recent Labs  Lab 07/08/22 2036 07/10/22 0522 07/11/22 0659  WBC 5.8 4.0 4.6  HGB 9.3* 7.8* 8.0*   HCT 28.3* 23.6* 24.9*  MCV 73.7* 74.4* 74.6*  PLT 298 228 255     Cardiac Enzymes: No results for input(s): "CKTOTAL", "CKMB", "CKMBINDEX", "TROPONINI" in the last 168 hours.  BNP: Invalid input(s): "POCBNP"  CBG: Recent Labs  Lab 07/10/22 0743 07/10/22 1140 07/10/22 1643 07/10/22 2129 07/11/22 0805  GLUCAP 81 86 103* 130* 94     Microbiology: Results for orders placed or performed in visit on 07/10/22  Body fluid culture w Gram Stain     Status: None (Preliminary result)   Collection Time: 07/10/22  3:15 PM   Specimen: PATH Cytology Peritoneal fluid  Result Value Ref Range Status   Specimen Description   Final    PERITONEAL Performed at [facility], [address], [place], [place] [address]    Special Requests   Final    NONE Performed at [facility], [address] [place], [place] [address]    Gram Stain   Final    WBC PRESENT, PREDOMINANTLY MONONUCLEAR NO ORGANISMS SEEN CYTOSPIN SMEAR    Culture   Final    NO GROWTH < 12 HOURS Performed at [facility], [place] [address], [place], [place] [address]    Report Status PENDING  Incomplete    Coagulation Studies: No results for input(s): "LABPROT", "INR" in the last 72  hours.  Urinalysis: Recent Labs    07/08/22 1714  [place] 1.012  PHURINE 8.0  GLUCOSEU NEGATIVE  HGBUR LARGE*  BILIRUBINUR NEGATIVE  KETONESUR NEGATIVE  PROTEINUR >=300*  NITRITE NEGATIVE  LEUKOCYTESUR NEGATIVE       Imaging: US Paracentesis  Result Date: 07/10/2022 INDICATION: 71 year old gentleman with acute kidney injury and abdominal ascites presents to IR for paracentesis EXAM: ULTRASOUND GUIDED  PARACENTESIS MEDICATIONS: None. COMPLICATIONS: None immediate. PROCEDURE: Informed written consent was obtained from the patient after a discussion of the risks, benefits and alternatives to treatment. A timeout was performed prior to the initiation of the procedure. Initial  ultrasound scanning demonstrates a moderate amount of ascites within the right lower abdominal quadrant. The right lower abdomen was prepped and draped in the usual sterile fashion. 1% lidocaine was used for local anesthesia. Following this, a 19 gauge, 7-cm, Yueh catheter was introduced. An ultrasound image was saved for documentation purposes. The paracentesis was performed. The catheter was removed and a dressing was applied. The patient tolerated the procedure well without immediate post procedural complication. FINDINGS: A total of approximately 2.1 L of straw-colored fluid was removed. Samples were sent to the laboratory as requested by the clinical team. IMPRESSION: Successful ultrasound-guided paracentesis yielding 2.1 liters of peritoneal fluid. Electronically Signed   By: Miachel Roux M.D.   On: 07/10/2022 16:29   CT ABDOMEN PELVIS WO CONTRAST  Result Date: 07/10/2022 CLINICAL DATA:  Hydronephrosis EXAM: CT ABDOMEN AND PELVIS WITHOUT CONTRAST TECHNIQUE: Multidetector CT imaging of the abdomen and pelvis was performed following the standard protocol without IV contrast. RADIATION DOSE REDUCTION: This exam was performed according to the departmental dose-optimization program which includes automated exposure control, adjustment of the mA and/or kV according to patient size and/or use of iterative reconstruction technique. COMPARISON:  CT 06/22/2022 FINDINGS: Lower chest: Small bilateral pleural effusions with adjacent atelectasis, right greater than left. Hepatobiliary: No focal liver abnormality is seen. Gallbladder is unremarkable. Pancreas: Diffuse pancreatic calcifications with antrum and mild ductal dilation and a pancreatic tail cyst measuring up to 1.5 cm, unchanged. Spleen: Normal in size without focal abnormality. Adrenals/Urinary Tract: Adrenal glands are unremarkable. Unchanged partially calcified renal cysts. There is persistent bilateral hydronephrosis, left greater than right. Pelvic  dilation is in part related to an extrarenal pelvis bilaterally. Persistent proximal hydroureter which tapers and appears decompressed distally. Bladder is decompressed with Foley catheter in place and associated intraluminal gas there is circumferential bladder wall thickening which could be due to under distension. Stomach/Bowel: Postsurgical changes of sigmoid colectomy with Hartmann's pouch and left lower quadrant colostomy. There is no evidence of bowel obstruction. There is a bowel and ascites containing right inguinal hernia, including the cecum and appendix. The appendix is decompressed and normal in appearance. Vascular/Lymphatic: Extensive aortoiliac atherosclerosis. No AAA. No lymphadenopathy. Reproductive: Unremarkable. Other: Increased, moderate to large volume ascites. Musculoskeletal: No acute osseous abnormality. No suspicious osseous lesion. Chronic bilateral L5 pars defects with grade 1 anterolisthesis at L5-S1 and mild disc bulging. Bilateral hip osteoarthritis. IMPRESSION: Persistent bilateral hydronephrosis and proximal hydroureter after bladder decompression, without obstructing lesion identified on noncontrast CT. Increased, moderate to large volume ascites. Postsurgical changes of sigmoid colectomy and left lower quadrant colostomy. Large right inguinal hernia containing ascites, distal ileum, cecum and appendix. Unchanged chronic calcific pancreatitis with probable 15 mm pseudocyst. Small bilateral pleural effusions, right greater than left. Electronically Signed   By: Maurine Simmering M.D.   On: 07/10/2022 09:07   US RENAL  Result  Date: 07/09/2022 CLINICAL DATA:  Acute kidney injury EXAM: RENAL / URINARY TRACT ULTRASOUND COMPLETE COMPARISON:  CT 06/22/2022 FINDINGS: Right Kidney: Renal measurements: 10.7 by 4.3 x 4.9 cm = volume: 119 mL. Moderate to severe right hydronephrosis. Complex cystic lesion in the lower pole of the right kidney measures 1.6 x 1.4 x 1.1 cm. Normal echotexture. Left  Kidney: Renal measurements: 10.1 x 4.9 x 4.2 cm = volume: 109 mL. Moderate to severe left hydronephrosis. Shadowing from the previously seen rim calcified lesion in the midpole of the left kidney. Normal echotexture. Bladder: Appears normal for degree of bladder distention. Other: Ascites noted in the abdomen and pelvis. IMPRESSION: Moderate to severe bilateral hydronephrosis, new since prior CT. Previously seen rim calcified lesions in both kidneys are noted but difficult to characterize due to shadowing from the calcifications. Moderate ascites. Electronically Signed   By: Rolm Baptise M.D.   On: 07/09/2022 15:25     Medications:    sodium chloride 100 mL/hr at 07/11/22 3568   albumin human      Chlorhexidine Gluconate Cloth  6 each Topical Daily   dorzolamide-timolol  1 drop Both Eyes BID   feeding supplement  237 mL Oral TID BM   fluticasone furoate-vilanterol  1 puff Inhalation Daily   And   umeclidinium bromide  1 puff Inhalation Daily   latanoprost  1 drop Both Eyes QHS   multivitamin with minerals  1 tablet Oral Daily   polyethylene glycol  17 g Oral Daily   QUEtiapine  25 mg Oral QHS   albumin human, albuterol, docusate sodium, hydrALAZINE, HYDROcodone-acetaminophen, ipratropium-albuterol, ondansetron **OR** ondansetron (ZOFRAN) IV, sodium chloride flush, traMADol  Assessment/ Plan:  Mr. Carl Bell is a 71 y.o.  male with past medical history of COPD, glaucoma, hypertension, and lung cancer, who was admitted to [facility] on 06/22/2022 for Small bowel obstruction ([place]) [K56.609] Volvulus of intestine ([place]) [K56.2] Sigmoid volvulus ([place]) [K56.2]   Acute kidney injury likely secondary to moderate to severe bilateral hydronephrosis seen on renal ultrasound.  Normal renal function noted on admission.  IV contrast exposure on admission.   Creatinine has returned to acceptable range.  Acceptable urine output recorded.  Appreciate urology consult and continued monitoring.  Lab Results   Component Value Date   CREATININE 1.29 (H) 07/11/2022   CREATININE 2.14 (H) 07/10/2022   CREATININE 3.63 (H) 07/08/2022    Intake/Output Summary (Last 24 hours) at 07/11/2022 1127 Last data filed at 07/11/2022 0816 Gross per 24 hour  Intake 3720.52 ml  Output 500 ml  Net 3220.52 ml    2. Anemia of chronic kidney disease Lab Results  Component Value Date   HGB 8.0 (L) 07/11/2022    Hgb decreased but stable today. Hematuria slightly improved in Foley.. Will continue to monitor.  3. Hypertension, essential. Home regimen includes furosemide, currently held in setting of acute kidney injury.  Blood pressure acceptable for this patient.  Avoid hypotension if possible.   Due to renal recovery, we will sign off at this time.  Defer discharge planning to primary team.  Patient will need continued follow-up with urology.   LOS: [place] 12/29/202311:27 AM

## 2022-07-11 NOTE — Plan of Care (Signed)
  Problem: Clinical Measurements: Goal: Ability to maintain clinical measurements within normal limits will improve Outcome: Progressing   Problem: Clinical Measurements: Goal: Will remain free from infection Outcome: Progressing   Problem: Clinical Measurements: Goal: Will remain free from infection Outcome: Progressing   Problem: Clinical Measurements: Goal: Diagnostic test results will improve Outcome: Progressing

## 2022-07-12 DIAGNOSIS — J449 Chronic obstructive pulmonary disease, unspecified: Secondary | ICD-10-CM | POA: Diagnosis not present

## 2022-07-12 DIAGNOSIS — R188 Other ascites: Secondary | ICD-10-CM | POA: Diagnosis present

## 2022-07-12 DIAGNOSIS — N401 Enlarged prostate with lower urinary tract symptoms: Secondary | ICD-10-CM | POA: Diagnosis present

## 2022-07-12 DIAGNOSIS — K562 Volvulus: Secondary | ICD-10-CM | POA: Diagnosis not present

## 2022-07-12 DIAGNOSIS — K7460 Unspecified cirrhosis of liver: Secondary | ICD-10-CM | POA: Diagnosis present

## 2022-07-12 DIAGNOSIS — N179 Acute kidney failure, unspecified: Secondary | ICD-10-CM | POA: Diagnosis not present

## 2022-07-12 LAB — CBC
HCT: 27.5 % — ABNORMAL LOW (ref 39.0–52.0)
Hemoglobin: 8.8 g/dL — ABNORMAL LOW (ref 13.0–17.0)
MCH: 24.2 pg — ABNORMAL LOW (ref 26.0–34.0)
MCHC: 32 g/dL (ref 30.0–36.0)
MCV: 75.5 fL — ABNORMAL LOW (ref 80.0–100.0)
Platelets: 273 10*3/uL (ref 150–400)
RBC: 3.64 MIL/uL — ABNORMAL LOW (ref 4.22–5.81)
RDW: 17.3 % — ABNORMAL HIGH (ref 11.5–15.5)
WBC: 5 10*3/uL (ref 4.0–10.5)
nRBC: 0 % (ref 0.0–0.2)

## 2022-07-12 LAB — BASIC METABOLIC PANEL
Anion gap: 7 (ref 5–15)
BUN: 25 mg/dL — ABNORMAL HIGH (ref 8–23)
CO2: 22 mmol/L (ref 22–32)
Calcium: 8.3 mg/dL — ABNORMAL LOW (ref 8.9–10.3)
Chloride: 112 mmol/L — ABNORMAL HIGH (ref 98–111)
Creatinine, Ser: 0.92 mg/dL (ref 0.61–1.24)
GFR, Estimated: >60 mL/min (ref >60)
Glucose, Bld: 85 mg/dL (ref 70–99)
Potassium: 3.7 mmol/L (ref 3.5–5.1)
Sodium: 141 mmol/L (ref 135–145)

## 2022-07-12 LAB — VITAMIN B12: Vitamin B-12: 731 pg/mL (ref 180–914)

## 2022-07-12 LAB — GLUCOSE, CAPILLARY
Glucose-Capillary: 67 mg/dL — ABNORMAL LOW (ref 70–99)
Glucose-Capillary: 85 mg/dL (ref 70–99)
Glucose-Capillary: 94 mg/dL (ref 70–99)
Glucose-Capillary: 75 mg/dL (ref 70–99)
Glucose-Capillary: 106 mg/dL — ABNORMAL HIGH (ref 70–99)

## 2022-07-12 LAB — MAGNESIUM: Magnesium: 1.7 mg/dL (ref 1.7–2.4)

## 2022-07-12 LAB — PHOSPHORUS: Phosphorus: 2.4 mg/dL — ABNORMAL LOW (ref 2.5–4.6)

## 2022-07-12 LAB — PROCALCITONIN: Procalcitonin: <0.1 ng/mL

## 2022-07-12 NOTE — Progress Notes (Signed)
Progress Note   Patient: Carl Bell HYI:[id number] DOB: June 29, 1951 DOA: 06/22/2022     20 DOS: the patient was seen and examined on 07/12/2022   Brief hospital course: Taken from prior notes.  MARVENS HOLLARS is a 71 y.o. male with past medical history significant for COPD with chronic respiratory failure on 2 L of oxygen, hypertension, history of lung cancer status post chemoradiation therapy, history of liver cirrhosis and hypertension who presents to the ER for evaluation of a 3-day history of constipation associated with diffuse abdominal pain.  CT scan of abdomen and pelvis which showed high-grade distal colonic obstruction due to sigmoid volvulus. Large right groin hernia containing cecum, ileum, and ascites. Chronic calcific pancreatitis.   Patient has been admitted to the surgical service and medical consult requested to manage patient's multiple comorbidities. S/p sigmoid colectomy on 06/23/2022.  Patient developed acute renal failure secondary to urinary retention 12/27.  Foley catheter is anchored, received fluids, renal function appears to be improving.  Patient also had a paracentesis removing 2.1 L of ascites.  Patient seems very high risk for deterioration and mortality based on underlying comorbidities and poor functional status.  PT/OT are recommending SNF.  Assessment and Plan:  Anasarca associated with disorder of kidney Liver cirrhosis with ascites. Patient no longer has any anasarca, patient had a 2.1 L of ascites removed.  Low albumin from ascites indicating portal hypertension. Liver ultrasound with Doppler obtained, probably will confirm portal hypertension.  Patient can be followed up with GI as outpatient after discharge.  Currently patient is stable, ascites better, no longer has any anasarca.     AKI (acute kidney injury) ([place]) secondary to obstruction. Benign prostate hypertrophy with urinary retention. Hyperkalemia resolved. Renal function and electrolytes  normalized. Patient will be continued with a Foley catheter, follow-up with urology as outpatient.   Volvulus of intestine (HCC) S/p sigmoid colectomy.  Having bowel movement in colostomy bag.   Acute postoperative anemia due to expected blood loss Patient has adequate B12 and iron level.  Hemoglobin stable.   Hypertension Currently blood pressure within goal.     COPD (chronic obstructive pulmonary disease) ([place]) Chronic hypoxemic respiratory failure Patient was chronically on 2 L oxygen, condition stable.  Patient condition has been stabilized, nothing to add.  Will sign off.  Call if there is any questions.     Subjective:  Patient doing well today, currently no complaints.  Good appetite.  Currently pending nursing placement.  Physical Exam: Vitals:   07/11/22 1956 07/12/22 0421 07/12/22 0753 07/12/22 0900  BP: 128/75 (!) 142/88 98/72   Pulse: 81 (!) 110 92   Resp: 16 20 17    Temp: 98.6 F (37 C) 98.5 F (36.9 C) 98.4 F (36.9 C)   TempSrc: Oral Oral Oral   SpO2: 100% 95% (!) 81% 90%  Weight:      Height:       General exam: Appears calm and comfortable  Respiratory system: Clear to auscultation. Respiratory effort normal. Cardiovascular system: S1 & S2 heard, RRR. No JVD, murmurs, rubs, gallops or clicks. No pedal edema. Gastrointestinal system: Abdomen is nondistended, soft and nontender. No organomegaly or masses felt. Normal bowel sounds heard. Central nervous system: Alert and oriented. No focal neurological deficits. Extremities: Symmetric 5 x 5 power. Skin: No rashes, lesions or ulcers Psychiatry: Judgement and insight appear normal. Mood & affect appropriate.   Data Reviewed:  Lab results reviewed.  Family Communication: None  Disposition: Status is: Inpatient Remains inpatient appropriate  because: Pending nursing home placement.  Planned Discharge Destination: Skilled nursing facility    Time spent: 35 minutes  Author: Sharen Hones,  MD 07/12/2022 9:48 AM  For on call review [email or web address].

## 2022-07-12 NOTE — Progress Notes (Signed)
Subjective:  CC: Carl Bell is a 71 y.o. male  Hospital stay day 20, 19 Days Post-Op Hartman's procedure for sigmoid volvulus  HPI: No complaints from patient overnight.    ROS:  General: Denies weight loss, weight gain, fatigue, fevers, chills, and night sweats. Heart: Denies chest pain, palpitations, racing heart, irregular heartbeat, leg pain or swelling, and decreased activity tolerance. Respiratory: Denies breathing difficulty, shortness of breath, wheezing, cough, and sputum. GI: Denies change in appetite, heartburn, nausea, vomiting, constipation, diarrhea, and blood in stool. GU: Denies difficulty urinating, pain with urinating, urgency, frequency, blood in urine.   Objective:   Temp:  [97.6 F (36.4 C)-98.6 F (37 C)] 98.4 F (36.9 C) (12/30 0753) Pulse Rate:  [80-110] 92 (12/30 0753) Resp:  [16-20] 17 (12/30 0753) BP: (98-142)/(72-88) 98/72 (12/30 0753) SpO2:  [81 %-100 %] 90 % (12/30 0900)     Height: 6\' 1"  (185.4 cm) Weight: 55 kg BMI (Calculated): 16   Intake/Output this shift:   Intake/Output Summary (Last 24 hours) at 07/12/2022 1139 Last data filed at 07/12/2022 0517 Gross per 24 hour  Intake 563.45 ml  Output 1100 ml  Net -536.55 ml    Constitutional :  alert, cooperative, appears stated age, and no distress  Respiratory:  clear to auscultation bilaterally  Cardiovascular:  regular rate and rhythm  Gastrointestinal: Soft, no guarding, ostomy intact, productive  Skin: Cool and moist.  incision healed  Psychiatric: Normal affect, non-agitated, not confused       LABS:     Latest Ref Rng & Units 07/12/2022    5:24 AM 07/11/2022    6:59 AM 07/10/2022    7:10 PM  CMP  Glucose 70 - 99 mg/dL 85  102    BUN 8 - 23 mg/dL 25  35    Creatinine 0.61 - 1.24 mg/dL 0.92  1.29    Sodium 135 - 145 mmol/L 141  140    Potassium 3.5 - 5.1 mmol/L 3.7  4.0    Chloride 98 - 111 mmol/L 112  113    CO2 22 - 32 mmol/L 22  23    Calcium 8.9 - 10.3 mg/dL 8.3  8.2     Total Protein 6.5 - 8.1 g/dL  4.8    Total Bilirubin 0.3 - 1.2 mg/dL  0.5  0.6   Alkaline Phos 38 - 126 U/L  44    AST 15 - 41 U/L  16    ALT 0 - 44 U/L  8        Latest Ref Rng & Units 07/12/2022    5:24 AM 07/11/2022    6:59 AM 07/10/2022    5:22 AM  CBC  WBC 4.0 - 10.5 K/uL 5.0  4.6  4.0   Hemoglobin 13.0 - 17.0 g/dL 8.8  8.0  7.8   Hematocrit 39.0 - 52.0 % 27.5  24.9  23.6   Platelets 150 - 400 K/uL 273  255  228     RADS: N/a Assessment:   Sigmoid volvulus, s/p decompression colonoscopy, subsequent Hartman's procedure, was pending SNF placement but started developing issues noted below last couple days:  Recovered well from AKI, ascites.   Pending SNF dispo  Patient will follow-up in the surgery office for Ostomy care as needed.  No lifting greater than 10 pounds, no push/pull greater than 30 pounds for 4 weeks postop.  4-6 months post op, if patient is healthy enough, can consider ostomy reversal.    labs/images/medications/previous chart entries reviewed  personally and relevant changes/updates noted above.

## 2022-07-12 NOTE — Progress Notes (Signed)
Attempted blood draw from port unsuccessfully. Repositioned patient, flushed line, however no blood return noted.  IV team consult placed and lab alerted for collection.

## 2022-07-13 LAB — CBC
HCT: 21 % — ABNORMAL LOW (ref 39.0–52.0)
Hemoglobin: 6.7 g/dL — ABNORMAL LOW (ref 13.0–17.0)
MCH: 24.4 pg — ABNORMAL LOW (ref 26.0–34.0)
MCHC: 31.9 g/dL (ref 30.0–36.0)
MCV: 76.4 fL — ABNORMAL LOW (ref 80.0–100.0)
Platelets: 194 10*3/uL (ref 150–400)
RBC: 2.75 MIL/uL — ABNORMAL LOW (ref 4.22–5.81)
RDW: 16.9 % — ABNORMAL HIGH (ref 11.5–15.5)
WBC: 7.4 10*3/uL (ref 4.0–10.5)
nRBC: 0 % (ref 0.0–0.2)

## 2022-07-13 LAB — GLUCOSE, CAPILLARY
Glucose-Capillary: 77 mg/dL (ref 70–99)
Glucose-Capillary: 58 mg/dL — ABNORMAL LOW (ref 70–99)
Glucose-Capillary: 90 mg/dL (ref 70–99)
Glucose-Capillary: 56 mg/dL — ABNORMAL LOW (ref 70–99)
Glucose-Capillary: 52 mg/dL — ABNORMAL LOW (ref 70–99)
Glucose-Capillary: 126 mg/dL — ABNORMAL HIGH (ref 70–99)

## 2022-07-13 LAB — HEMOGLOBIN AND HEMATOCRIT, BLOOD
HCT: 24 % — ABNORMAL LOW (ref 39.0–52.0)
Hemoglobin: 7.7 g/dL — ABNORMAL LOW (ref 13.0–17.0)

## 2022-07-13 LAB — CREATININE, BODY FLUID OTHER: Creatinine, Body Fluid: 1.6 mg/dL

## 2022-07-13 MED ORDER — TAMSULOSIN HCL 0.4 MG PO CAPS
0.4000 mg | ORAL_CAPSULE | Freq: Every day | ORAL | Status: DC
  Administered 2022-07-13 – 2022-07-18 (×6): 0.4 mg via ORAL
  Filled 2022-07-13 (×6): qty 1

## 2022-07-13 NOTE — Progress Notes (Signed)
Glucose 58 when checked at noon. Lunch tray arrived. Gave patient time to eat meal and then performed a recheck at 1300. Patient was not symptomatic and preferred to eat his lunch. Educated patient on Glucose protocol and options. He choose to eat. Result of recheck = 90 CBG reading. :end note

## 2022-07-13 NOTE — Progress Notes (Signed)
Occupational Therapy Treatment Patient Details Name: Carl Bell MRN: [id number] DOB: [birth year] Today's Date: 07/13/2022   History of present illness Carl Bell is a 71 y.o. male with past medical history significant for low vision, COPD with chronic respiratory failure on 2 L of oxygen, hypertension, history of lung cancer status post chemoradiation therapy, history of liver cirrhosis and hypertension who presents to the ER for evaluation of a 3-day history of constipation associated with diffuse abdominal pain. S/p Hartman's procedure for sigmoid volvulus on 06/23/22.   OT comments  Chart reviewed, pt greeted in bed agreeable to OT tx session with encouragement. Pt is oriented x3, not oriented to date, continued multi modal cues for processing in addition to visual deficit. Tx session targeted progressing functional mobility in preparation for improved indep during ADL tasks. MIN A required for bed mobility, STS multiple attempts with HHA +1 with MIN A, short amb transfer to bedside chair with MIN-MOD A with multi modal cues. Pt is making slow progress towards goals, discharge recommendation remains appropriate. OT will continue to follow acutely.    Recommendations for follow up therapy are one component of a multi-disciplinary discharge planning process, led by the attending physician.  Recommendations may be updated based on patient status, additional functional criteria and insurance authorization.    Follow Up Recommendations  Skilled nursing-short term rehab (<3 hours/day)     Assistance Recommended at Discharge Frequent or constant Supervision/Assistance  Patient can return home with the following  A little help with walking and/or transfers;A little help with bathing/dressing/bathroom;Help with stairs or ramp for entrance   Equipment Recommendations  BSC/3in1    Recommendations for Other Services      Precautions / Restrictions Precautions Precautions: Fall Precaution  Comments: blind Restrictions Weight Bearing Restrictions: No       Mobility Bed Mobility Overal bed mobility: Needs Assistance Bed Mobility: Supine to Sit     Supine to sit: Min assist          Transfers Overall transfer level: Needs assistance Equipment used: 1 person hand held assist Transfers: Sit to/from Stand Sit to Stand: Min assist (multiple attempts)                 Balance Overall balance assessment: Needs assistance Sitting-balance support: No upper extremity supported, Feet supported       Standing balance support: Single extremity supported Standing balance-Leahy Scale: Fair                             ADL either performed or assessed with clinical judgement   ADL Overall ADL's : Needs assistance/impaired     Grooming: Wash/dry face;Sitting;Supervision/safety               Lower Body Dressing: Maximal assistance   Toilet Transfer: Minimal assistance;Moderate assistance Toilet Transfer Details (indicate cue type and reason): short amb transfer with HHA, step by step verbal/tactile cues due to visual deficit Toileting- Clothing Manipulation and Hygiene: Maximal assistance              Extremity/Trunk Assessment              Vision       Perception     Praxis      Cognition Arousal/Alertness: Awake/alert Behavior During Therapy: WFL for tasks assessed/performed Overall Cognitive Status: No family/caregiver present to determine baseline cognitive functioning Area of Impairment: Orientation, Safety/judgement, Attention, Following commands, Awareness, Problem solving  Orientation Level: Disoriented to, Time Current Attention Level: Focused   Following Commands: Follows one step commands with increased time Safety/Judgement: Decreased awareness of safety, Decreased awareness of deficits Awareness: Emergent Problem Solving: Slow processing, Decreased initiation, Requires verbal cues,  Requires tactile cues          Exercises Other Exercises Other Exercises: edu re: importance of continued mobility    Shoulder Instructions       General Comments vitals monitored, appear stable throughout. Enlarged testicles noted with RN in room; All lines/leads/foley, ostomy bag intact pre/post session    Pertinent Vitals/ Pain       Pain Assessment Pain Assessment: No/denies pain  Home Living                                          Prior Functioning/Environment              Frequency  Min 2X/week        Progress Toward Goals  OT Goals(current goals can now be found in the care plan section)  Progress towards OT goals: Progressing toward goals     Plan Discharge plan remains appropriate;Frequency remains appropriate    Co-evaluation                 AM-PAC OT "6 Clicks" Daily Activity     Outcome Measure   Help from another person eating meals?: A Little Help from another person taking care of personal grooming?: A Lot Help from another person toileting, which includes using toliet, bedpan, or urinal?: A Little Help from another person bathing (including washing, rinsing, drying)?: A Lot Help from another person to put on and taking off regular upper body clothing?: A Little Help from another person to put on and taking off regular lower body clothing?: A Lot 6 Click Score: 15    End of Session Equipment Utilized During Treatment: Oxygen  OT Visit Diagnosis: Other abnormalities of gait and mobility (R26.89);Muscle weakness (generalized) (M62.81)   Activity Tolerance Patient tolerated treatment well   Patient Left in chair;with call bell/phone within reach;with chair alarm set   Nurse Communication Mobility status        Time: 3762-8315 OT Time Calculation (min): 19 min  Charges: OT Treatments $Therapeutic Activity: 8-22 mins  Shanon Payor, OTD OTR/L  07/13/22, 2:54 PM

## 2022-07-13 NOTE — Plan of Care (Signed)

## 2022-07-13 NOTE — Progress Notes (Addendum)
Subjective:  CC: Carl Bell is a 71 y.o. male  Hospital stay day 21, 20 Days Post-Op Hartman's procedure for sigmoid volvulus  HPI: No complaints from patient overnight.    ROS:  General: Denies weight loss, weight gain, fatigue, fevers, chills, and night sweats. Heart: Denies chest pain, palpitations, racing heart, irregular heartbeat, leg pain or swelling, and decreased activity tolerance. Respiratory: Denies breathing difficulty, shortness of breath, wheezing, cough, and sputum. GI: Denies change in appetite, heartburn, nausea, vomiting, constipation, diarrhea, and blood in stool. GU: Denies difficulty urinating, pain with urinating, urgency, frequency, blood in urine.   Objective:   Temp:  [97.3 F (36.3 C)-98.7 F (37.1 C)] 98.2 F (36.8 C) (12/31 0740) Pulse Rate:  [70-108] 99 (12/31 0740) Resp:  [16-20] 17 (12/31 0740) BP: (90-124)/(70-90) 92/77 (12/31 0740) SpO2:  [76 %-98 %] 98 % (12/31 0740)     Height: 6\' 1"  (185.4 cm) Weight: 55 kg BMI (Calculated): 16   Intake/Output this shift:   Intake/Output Summary (Last 24 hours) at 07/13/2022 1001 Last data filed at 07/13/2022 0551 Gross per 24 hour  Intake --  Output 650 ml  Net -650 ml    Constitutional :  alert, cooperative, appears stated age, and no distress  Respiratory:  clear to auscultation bilaterally  Cardiovascular:  regular rate and rhythm  Gastrointestinal: Soft, no guarding, ostomy intact, productive  Skin: Cool and moist.  incision healed  Psychiatric: Normal affect, non-agitated, not confused       LABS:     Latest Ref Rng & Units 07/12/2022    5:24 AM 07/11/2022    6:59 AM 07/10/2022    7:10 PM  CMP  Glucose 70 - 99 mg/dL 85  102    BUN 8 - 23 mg/dL 25  35    Creatinine 0.61 - 1.24 mg/dL 0.92  1.29    Sodium 135 - 145 mmol/L 141  140    Potassium 3.5 - 5.1 mmol/L 3.7  4.0    Chloride 98 - 111 mmol/L 112  113    CO2 22 - 32 mmol/L 22  23    Calcium 8.9 - 10.3 mg/dL 8.3  8.2    Total  Protein 6.5 - 8.1 g/dL  4.8    Total Bilirubin 0.3 - 1.2 mg/dL  0.5  0.6   Alkaline Phos 38 - 126 U/L  44    AST 15 - 41 U/L  16    ALT 0 - 44 U/L  8        Latest Ref Rng & Units 07/13/2022    4:40 AM 07/12/2022    5:24 AM 07/11/2022    6:59 AM  CBC  WBC 4.0 - 10.5 K/uL 7.4  5.0  4.6   Hemoglobin 13.0 - 17.0 g/dL 6.7  8.8  8.0   Hematocrit 39.0 - 52.0 % 21.0  27.5  24.9   Platelets 150 - 400 K/uL 194  273  255     RADS: N/a Assessment:   Sigmoid volvulus, s/p decompression colonoscopy, subsequent Hartman's procedure, was pending SNF placement but started developing issues noted below last couple days:  Recovered well from AKI, ascites.  Nephro on hospice list signed off.  Pending SNF dispo.  Will continue Foley catheter and start Flomax per urology recommendations.  They have signed off as well  Sudden drop in hemoglobin noted from a.m. labs this morning.  No signs of overt bleeding or obvious cause.  Will repeat to ensure it is not  a lab error.  Patient will follow-up in the surgery office for Ostomy care as needed.  No lifting greater than 10 pounds, no push/pull greater than 30 pounds for 4 weeks postop.  4-6 months post op, if patient is healthy enough, can consider ostomy reversal.    labs/images/medications/previous chart entries reviewed personally and relevant changes/updates noted above.

## 2022-07-14 LAB — GLUCOSE, CAPILLARY
Glucose-Capillary: 85 mg/dL (ref 70–99)
Glucose-Capillary: 77 mg/dL (ref 70–99)
Glucose-Capillary: 79 mg/dL (ref 70–99)
Glucose-Capillary: 90 mg/dL (ref 70–99)

## 2022-07-14 LAB — BODY FLUID CULTURE W GRAM STAIN: Culture: NO GROWTH

## 2022-07-14 NOTE — Progress Notes (Signed)
07/14/2022  Subjective: Patient is 21 Days Post-Op.  No acute events overnight.  Patient reports some discomfort at the umbilical area.  Has been having ostomy function without any nausea or vomiting.  Vital signs: Temp:  [97.9 F (36.6 C)-98.1 F (36.7 C)] 98.1 F (36.7 C) (01/01 0700) Pulse Rate:  [84-100] 96 (01/01 0700) Resp:  [16-18] 16 (01/01 0700) BP: (83-101)/(59-70) 101/69 (01/01 0700) SpO2:  [99 %-100 %] 100 % (01/01 0700)   Intake/Output: 12/31 0701 - 01/01 0700 In: 120 [P.O.:120] Out: 900 [Urine:900] Last BM Date : 07/13/22  Physical Exam: Constitutional: No acute distress Abdomen: Soft, nondistended, with some discomfort to palpation of the umbilical area.  Incisions healing well without any evidence of hernia formation.  No erythema or induration.  Left lower quadrant ostomy with stool in the bag.  Patient has a chronic incarcerated right inguinal hernia.  Labs:  Recent Labs    07/12/22 0524 07/13/22 0440 07/13/22 1047  WBC 5.0 7.4  --   HGB 8.8* 6.7* 7.7*  HCT 27.5* 21.0* 24.0*  PLT 273 194  --    Recent Labs    07/12/22 0524  NA 141  K 3.7  CL 112*  CO2 22  GLUCOSE 85  BUN 25*  CREATININE 0.92  CALCIUM 8.3*   No results for input(s): "LABPROT", "INR" in the last 72 hours.  Imaging: No results found.  Assessment/Plan: This is a 72 y.o. male s/p Hartman's procedure for colonic volvulus.  - Patient's AKI has recovered and otherwise doing well from that standpoint.  Abdomen is nondistended.  Continue regular diet as tolerated. - Currently pending SNF dispo.   Melvyn Neth, [facility]

## 2022-07-14 NOTE — TOC Progression Note (Signed)
Transition of Care [facility]) - Progression Note    Patient Details  Name: Carl Bell MRN: [id number] Date of Birth: 04/24/1951  Transition of Care [facility]) CM/SW [place], LCSW Phone Number: 07/14/2022, 9:59 AM  Clinical Narrative: Madelynn Done will restart insurance authorization today.    Expected Discharge Plan: [place] Barriers to Discharge: Continued Medical Work up  Expected Discharge Plan and Services     Post Acute Care Choice:  (TBD) Living arrangements for the past 2 months: Single Family Home                           HH Arranged: RN, PT, OT [facility] Agency: [place] Date [place]: 06/28/22 Time [place]: 4034 Representative spoke with at [place]: [place] Determinants of Health ([place]) Interventions [place]: No Food Insecurity (06/30/2022)  Housing: Low Risk  (06/30/2022)  Transportation Needs: No Transportation Needs (06/30/2022)  Utilities: Not At Risk (06/30/2022)  Tobacco Use: High Risk (06/24/2022)    Readmission Risk Interventions    06/24/2022   11:08 AM  Readmission Risk Prevention Plan  Transportation Screening Complete  PCP or Specialist Appt within 3-5 Days Complete  Social Work Consult for [place] Planning/Counseling [place] Not Applicable  Medication Review Press photographer) Complete

## 2022-07-14 NOTE — Plan of Care (Signed)

## 2022-07-14 NOTE — Plan of Care (Signed)

## 2022-07-15 LAB — COMPREHENSIVE METABOLIC PANEL
ALT: 10 U/L (ref 0–44)
AST: 20 U/L (ref 15–41)
Albumin: 2.2 g/dL — ABNORMAL LOW (ref 3.5–5.0)
Alkaline Phosphatase: 51 U/L (ref 38–126)
Anion gap: 8 (ref 5–15)
BUN: 27 mg/dL — ABNORMAL HIGH (ref 8–23)
CO2: 24 mmol/L (ref 22–32)
Calcium: 8.7 mg/dL — ABNORMAL LOW (ref 8.9–10.3)
Chloride: 105 mmol/L (ref 98–111)
Creatinine, Ser: 0.99 mg/dL (ref 0.61–1.24)
GFR, Estimated: >60 mL/min (ref >60)
Glucose, Bld: 134 mg/dL — ABNORMAL HIGH (ref 70–99)
Potassium: 3.9 mmol/L (ref 3.5–5.1)
Sodium: 137 mmol/L (ref 135–145)
Total Bilirubin: 1 mg/dL (ref 0.3–1.2)
Total Protein: 5.7 g/dL — ABNORMAL LOW (ref 6.5–8.1)

## 2022-07-15 LAB — GLUCOSE, CAPILLARY
Glucose-Capillary: 126 mg/dL — ABNORMAL HIGH (ref 70–99)
Glucose-Capillary: 124 mg/dL — ABNORMAL HIGH (ref 70–99)
Glucose-Capillary: 120 mg/dL — ABNORMAL HIGH (ref 70–99)

## 2022-07-15 LAB — CBC
HCT: 23.9 % — ABNORMAL LOW (ref 39.0–52.0)
Hemoglobin: 7.7 g/dL — ABNORMAL LOW (ref 13.0–17.0)
MCH: 24.4 pg — ABNORMAL LOW (ref 26.0–34.0)
MCHC: 32.2 g/dL (ref 30.0–36.0)
MCV: 75.6 fL — ABNORMAL LOW (ref 80.0–100.0)
Platelets: 304 10*3/uL (ref 150–400)
RBC: 3.16 MIL/uL — ABNORMAL LOW (ref 4.22–5.81)
RDW: 16.6 % — ABNORMAL HIGH (ref 11.5–15.5)
WBC: 4.3 10*3/uL (ref 4.0–10.5)
nRBC: 0 % (ref 0.0–0.2)

## 2022-07-15 NOTE — Progress Notes (Signed)
Physical Therapy Treatment Patient Details Name: Carl Bell MRN: [id number] DOB: September 16, 1950 Today's Date: 07/15/2022   History of Present Illness Carl Bell is a 72 y.o. male with past medical history significant for low vision, COPD with chronic respiratory failure on 2 L of oxygen, hypertension, history of lung cancer status post chemoradiation therapy, history of liver cirrhosis and hypertension who presents to the ER for evaluation of a 3-day history of constipation associated with diffuse abdominal pain. S/p Hartman's procedure for sigmoid volvulus on 06/23/22.    PT Comments    Patient with improved cognition this session but continues to be disoriented and confused during conversation. Agreeable to OOB mobility but requires cues and assist to maintain orientation of where he is at within in the room as patient thinking he was in recliner while still in bed. Required minA to stand from EOB and take pivotal steps towards recliner with HHAx1 and step by step cueing. Once in recliner, reporting 7/10 fatigue. After seated rest break, patient able to perform sit to stand x 5 from recliner with min guard for safety. Chair alarm in place at end of session. Contacted RN and NT about patient needing assistance for feeding. Continue to recommend SNF for ongoing Physical Therapy.       Recommendations for follow up therapy are one component of a multi-disciplinary discharge planning process, led by the attending physician.  Recommendations may be updated based on patient status, additional functional criteria and insurance authorization.  Follow Up Recommendations  Skilled nursing-short term rehab (<3 hours/day) Can patient physically be transported by private vehicle: Yes   Assistance Recommended at Discharge Frequent or constant Supervision/Assistance  Patient can return home with the following A little help with walking and/or transfers;A little help with bathing/dressing/bathroom;Help with  stairs or ramp for entrance   Equipment Recommendations  BSC/3in1    Recommendations for Other Services       Precautions / Restrictions Precautions Precautions: Fall Precaution Comments: blind Restrictions Weight Bearing Restrictions: No     Mobility  Bed Mobility Overal bed mobility: Needs Assistance Bed Mobility: Supine to Sit     Supine to sit: Min assist     General bed mobility comments: assist for LE management    Transfers Overall transfer level: Needs assistance Equipment used: 1 person hand held assist Transfers: Sit to/from Stand, Bed to chair/wheelchair/BSC Sit to Stand: Min assist, Min guard   Step pivot transfers: Min assist       General transfer comment: assist to stand from EOB and cues for hand placement. Able to stand from recliner x 5 with min guard. Assist required for step pivot transfer for balance and cues for guidance over to chair. Patient rating fatigue 7/10 after transfer    Ambulation/Gait                   Stairs             Wheelchair Mobility    Modified Rankin (Stroke Patients Only)       Balance Overall balance assessment: Needs assistance Sitting-balance support: No upper extremity supported, Feet supported Sitting balance-Leahy Scale: Good     Standing balance support: Single extremity supported Standing balance-Leahy Scale: Fair                              Cognition Arousal/Alertness: Awake/alert Behavior During Therapy: WFL for tasks assessed/performed Overall Cognitive Status: No family/caregiver present to determine  baseline cognitive functioning Area of Impairment: Orientation, Safety/judgement, Attention, Following commands, Awareness, Problem solving                 Orientation Level: Disoriented to, Place Current Attention Level: Sustained   Following Commands: Follows one step commands with increased time Safety/Judgement: Decreased awareness of safety, Decreased  awareness of deficits Awareness: Emergent Problem Solving: Slow processing, Decreased initiation, Requires verbal cues, Requires tactile cues General Comments: initially able to state correct place but with conversation, patient disoriented to place. Follows commands appropriately with increased time. When asked to get OOB, patient asking "put the feet down". Increased time to reorient patient that he was in bed and not in recliner        Exercises Other Exercises Other Exercises: sit to stand x 5 with B UE support    General Comments General comments (skin integrity, edema, etc.): VSS on 2L O2      Pertinent Vitals/Pain Pain Assessment Pain Assessment: Faces Faces Pain Scale: Hurts a little bit Pain Location: abdomen Pain Descriptors / Indicators: Discomfort, Grimacing, Dull Pain Intervention(s): Monitored during session    Home Living                          Prior Function            PT Goals (current goals can now be found in the care plan section) Acute Rehab PT Goals Patient Stated Goal: to go home PT Goal Formulation: With patient Time For Goal Achievement: 07/09/22 Potential to Achieve Goals: Fair Progress towards PT goals: Progressing toward goals    Frequency    Min 2X/week      PT Plan Current plan remains appropriate    Co-evaluation              AM-PAC PT "6 Clicks" Mobility   Outcome Measure  Help needed turning from your back to your side while in a flat bed without using bedrails?: A Little Help needed moving from lying on your back to sitting on the side of a flat bed without using bedrails?: A Little Help needed moving to and from a bed to a chair (including a wheelchair)?: A Lot Help needed standing up from a chair using your arms (e.g., wheelchair or bedside chair)?: A Little Help needed to walk in hospital room?: A Lot Help needed climbing 3-5 steps with a railing? : Total 6 Click Score: 14    End of Session Equipment  Utilized During Treatment: Oxygen Activity Tolerance: Patient tolerated treatment well Patient left: in chair;with call bell/phone within reach;with chair alarm set Nurse Communication: Mobility status;Other (comment) (need for feeding assistance) PT Visit Diagnosis: Unsteadiness on feet (R26.81);Muscle weakness (generalized) (M62.81);Difficulty in walking, not elsewhere classified (R26.2)     Time: 9702-6378 PT Time Calculation (min) (ACUTE ONLY): 24 min  Charges:  $Therapeutic Exercise: 8-22 mins $Therapeutic Activity: 8-22 mins                     Keltie Labell A. Gilford Rile PT, DPT [facility] - Acute Rehabilitation Services    Mariyam Remington A Jaramiah Bossard 07/15/2022, 10:19 AM

## 2022-07-15 NOTE — Progress Notes (Signed)
Subjective:  CC: Carl Bell is a 72 y.o. male  Hospital stay day 23, 22 Days Post-Op Hartman's procedure for sigmoid volvulus  HPI: No complaints from patient overnight.    ROS:  General: Denies weight loss, weight gain, fatigue, fevers, chills, and night sweats. Heart: Denies chest pain, palpitations, racing heart, irregular heartbeat, leg pain or swelling, and decreased activity tolerance. Respiratory: Denies breathing difficulty, shortness of breath, wheezing, cough, and sputum. GI: Denies change in appetite, heartburn, nausea, vomiting, constipation, diarrhea, and blood in stool. GU: Denies difficulty urinating, pain with urinating, urgency, frequency, blood in urine.   Objective:   Temp:  [97.4 F (36.3 C)-98.9 F (37.2 C)] 98.3 F (36.8 C) (01/02 1449) Pulse Rate:  [93-106] 106 (01/02 1449) Resp:  [18-20] 18 (01/02 1449) BP: (94-105)/(54-66) 105/64 (01/02 1449) SpO2:  [64 %-100 %] 99 % (01/02 1449)     Height: 6\' 1"  (185.4 cm) Weight: 55 kg BMI (Calculated): 16   Intake/Output this shift:   Intake/Output Summary (Last 24 hours) at 07/15/2022 1543 Last data filed at 07/14/2022 2100 Gross per 24 hour  Intake 118 ml  Output 0 ml  Net 118 ml    Constitutional :  alert, cooperative, appears stated age, and no distress  Respiratory:  clear to auscultation bilaterally  Cardiovascular:  regular rate and rhythm  Gastrointestinal: Soft, no guarding, ostomy intact, productive  Skin: Cool and moist.  incision healed  Psychiatric: Normal affect, non-agitated, not confused       LABS:     Latest Ref Rng & Units 07/15/2022    2:04 PM 07/12/2022    5:24 AM 07/11/2022    6:59 AM  CMP  Glucose 70 - 99 mg/dL 134  85  102   BUN 8 - 23 mg/dL 27  25  35   Creatinine 0.61 - 1.24 mg/dL 0.99  0.92  1.29   Sodium 135 - 145 mmol/L 137  141  140   Potassium 3.5 - 5.1 mmol/L 3.9  3.7  4.0   Chloride 98 - 111 mmol/L 105  112  113   CO2 22 - 32 mmol/L 24  22  23    Calcium 8.9 - 10.3  mg/dL 8.7  8.3  8.2   Total Protein 6.5 - 8.1 g/dL 5.7   4.8   Total Bilirubin 0.3 - 1.2 mg/dL 1.0   0.5   Alkaline Phos 38 - 126 U/L 51   44   AST 15 - 41 U/L 20   16   ALT 0 - 44 U/L 10   8       Latest Ref Rng & Units 07/15/2022    2:04 PM 07/13/2022   10:47 AM 07/13/2022    4:40 AM  CBC  WBC 4.0 - 10.5 K/uL 4.3   7.4   Hemoglobin 13.0 - 17.0 g/dL 7.7  7.7  6.7   Hematocrit 39.0 - 52.0 % 23.9  24.0  21.0   Platelets 150 - 400 K/uL 304   194     RADS: N/a Assessment:   Sigmoid volvulus, s/p decompression colonoscopy, subsequent Hartman's procedure, was pending SNF placement but started developing issues noted below last couple days:  Recovered well from AKI, ascites.  Nephro on hospice list signed off.  Pending SNF dispo.  Will continue Foley catheter and start Flomax per urology recommendations.  They have signed off as well  Nothing obviously concerning on repeat labs for reported confusion by family member..  Confusion likely is just  a transient thing that he has been dealing with since hospitalization.  Will continue to monitor for now.    Patient will follow-up in the surgery office for Ostomy care as needed.  No lifting greater than 10 pounds, no push/pull greater than 30 pounds for 4 weeks postop.  4-6 months post op, if patient is healthy enough, can consider ostomy reversal.    labs/images/medications/previous chart entries reviewed personally and relevant changes/updates noted above.

## 2022-07-15 NOTE — Progress Notes (Signed)
Mobility Specialist - Progress Note   07/15/22 1629  Mobility  Activity Transferred from bed to chair  Level of Assistance Moderate assist, patient does 50-74%  Assistive Device Front wheel walker  Distance Ambulated (ft) 4 ft  Activity Response Tolerated well  $Mobility charge 1 Mobility   Pt supine upon entry, utilizing 2L but [place] doffed. Pt O2 seemly 67% with poor pleth, no s/s of distress. Pt completed bed mob and STS to RW ModA, Max VC for hand placement on RW and sequencing due to blindness. Pt left sitting in recliner with alarm set and needs within reach. Pt placed back on 2L [place] with O2 >92%.   [place]ie [place] Mobility Specialist 07/15/22 4:36 PM

## 2022-07-15 NOTE — Plan of Care (Signed)
  Problem: Education: Goal: Knowledge of General Education information will improve Description: Including pain rating scale, medication(s)/side effects and non-pharmacologic comfort measures Outcome: Progressing   Problem: Health Behavior/Discharge Planning: Goal: Ability to manage health-related needs will improve Outcome: Progressing   Problem: Clinical Measurements: Goal: Ability to maintain clinical measurements within normal limits will improve Outcome: Progressing   Problem: Nutrition: Goal: Adequate nutrition will be maintained Outcome: Progressing   Problem: Coping: Goal: Level of anxiety will decrease Outcome: Progressing   Problem: Elimination: Goal: Will not experience complications related to bowel motility Outcome: Progressing Goal: Will not experience complications related to urinary retention Outcome: Progressing   Problem: Pain Managment: Goal: General experience of comfort will improve Outcome: Progressing   Problem: Safety: Goal: Ability to remain free from injury will improve Outcome: Progressing   Problem: Skin Integrity: Goal: Risk for impaired skin integrity will decrease Outcome: Progressing

## 2022-07-15 NOTE — TOC Progression Note (Addendum)
Transition of Care [facility]) - Progression Note    Patient Details  Name: Carl Bell MRN: [id number] Date of Birth: 06-22-1951  Transition of Care [facility]) CM/SW Contact  Beverly Sessions, RN Phone Number: 07/15/2022, 10:37 AM  Clinical Narrative:     Damaris Schooner with Altha Harm at [facility].  Auth was not started yesterday, to be started today   Vm left for brother Gaspar Bidding to update that we Josem Kaufmann is still pending for SNF  Expected Discharge Plan: [place] Barriers to Discharge: Continued Medical Work up  Expected Discharge Plan and Services     Post Acute Care Choice:  (TBD) Living arrangements for the past 2 months: Single Family Home                           HH Arranged: RN, PT, OT [facility] Agency: [place] Date [place]: 06/28/22 Time [place]: 2536 Representative spoke with at [place]: [place] Determinants of Health ([place]) Interventions [place]: No Food Insecurity (06/30/2022)  Housing: Low Risk  (06/30/2022)  Transportation Needs: No Transportation Needs (06/30/2022)  Utilities: Not At Risk (06/30/2022)  Tobacco Use: High Risk (06/24/2022)    Readmission Risk Interventions    06/24/2022   11:08 AM  Readmission Risk Prevention Plan  Transportation Screening Complete  PCP or Specialist Appt within 3-5 Days Complete  Social Work Consult for [place] Planning/Counseling [place] Not Applicable  Medication Review Press photographer) Complete

## 2022-07-15 NOTE — TOC Progression Note (Signed)
Transition of Care [facility]) - Progression Note    Patient Details  Name: Carl Bell MRN: [id number] Date of Birth: 08/25/1950  Transition of Care [facility]) CM/SW Contact  Beverly Sessions, RN Phone Number: 07/15/2022, 11:37 AM  Clinical Narrative:    Kaleen Mask and patient's mother were at bedside.  Updated them on the insurance status for SNF   Expected Discharge Plan: [place] Barriers to Discharge: Continued Medical Work up  Expected Discharge Plan and Services     Post Acute Care Choice:  (TBD) Living arrangements for the past 2 months: Single Family Home                           HH Arranged: RN, PT, OT [facility] Agency: [place] Date [place]: 06/28/22 Time [place]: 7209 Representative spoke with at [place]: [place] Determinants of Health ([place]) Interventions [place]: No Food Insecurity (06/30/2022)  Housing: Low Risk  (06/30/2022)  Transportation Needs: No Transportation Needs (06/30/2022)  Utilities: Not At Risk (06/30/2022)  Tobacco Use: High Risk (06/24/2022)    Readmission Risk Interventions    06/24/2022   11:08 AM  Readmission Risk Prevention Plan  Transportation Screening Complete  PCP or Specialist Appt within 3-5 Days Complete  Social Work Consult for [place] Planning/Counseling [place] Not Applicable  Medication Review Press photographer) Complete

## 2022-07-16 LAB — GLUCOSE, CAPILLARY
Glucose-Capillary: 83 mg/dL (ref 70–99)
Glucose-Capillary: 145 mg/dL — ABNORMAL HIGH (ref 70–99)
Glucose-Capillary: 107 mg/dL — ABNORMAL HIGH (ref 70–99)
Glucose-Capillary: 101 mg/dL — ABNORMAL HIGH (ref 70–99)

## 2022-07-16 NOTE — Progress Notes (Addendum)
Mobility Specialist - Progress Note   07/16/22 1131  Mobility  Activity Turned to right side;Turned to left side;Turned to back - supine;Transferred from bed to chair  Level of Assistance +2 (takes two people)  Assistive Device None  Activity Response Tolerated well  $Mobility charge 1 Mobility    Pt supine upon entry, utilizing 2L [place]. Pt exteremly confused and unable to fully comprehend commands to this date. Pt completed bed mob MaxA +2 to the EOB, Max multi cueing. Pt attempt OOB transfer with RW twice, unable to bring B LE closer to body. Pt returned seated EOB, transferred back supine. Slide transfer to chair MaxA +2. Pt left sitting in recliner with alarm set and needs within reach.   Candie [place] Mobility Specialist 07/16/22 11:47 AM

## 2022-07-16 NOTE — TOC Progression Note (Signed)
Transition of Care [facility]) - Progression Note    Patient Details  Name: Carl Bell MRN: [id number] Date of Birth: 18-Jul-1950  Transition of Care [facility]) CM/SW Contact  Beverly Sessions, RN Phone Number: 07/16/2022, 9:26 AM  Clinical Narrative:     SNF auth pending   Expected Discharge Plan: [place] Barriers to Discharge: Continued Medical Work up  Expected Discharge Plan and Services     Post Acute Care Choice:  (TBD) Living arrangements for the past 2 months: Single Family Home                           HH Arranged: RN, PT, OT [facility] Agency: [place] Date [place]: 06/28/22 Time [place]: 3005 Representative spoke with at [place]: [place] Determinants of Health ([place]) Interventions [place]: No Food Insecurity (06/30/2022)  Housing: Low Risk  (06/30/2022)  Transportation Needs: No Transportation Needs (06/30/2022)  Utilities: Not At Risk (06/30/2022)  Tobacco Use: High Risk (06/24/2022)    Readmission Risk Interventions    06/24/2022   11:08 AM  Readmission Risk Prevention Plan  Transportation Screening Complete  PCP or Specialist Appt within 3-5 Days Complete  Social Work Consult for [place] Planning/Counseling [place] Not Applicable  Medication Review Press photographer) Complete

## 2022-07-16 NOTE — Progress Notes (Signed)
Mobility Specialist - Progress Note   07/16/22 1246  Mobility  Activity Transferred from chair to bed  Level of Assistance +2 (takes two people) (Simultaneous filing. User may not have seen previous data.)  Assistive Device None  Activity Response Tolerated well  $Mobility charge 1 Mobility (Simultaneous filing. User may not have seen previous data.)   Pt sitting in recline upon entry, utilizing 2L [place]. Pt transferred from recliner to bed using slide transfer method, tolerated well. Pt left supine with alarm set and needs within reach.

## 2022-07-17 ENCOUNTER — Inpatient Hospital Stay: Payer: Medicare Other

## 2022-07-17 DIAGNOSIS — H5500 Unspecified nystagmus: Secondary | ICD-10-CM | POA: Diagnosis not present

## 2022-07-17 LAB — GLUCOSE, CAPILLARY
Glucose-Capillary: 85 mg/dL (ref 70–99)
Glucose-Capillary: 78 mg/dL (ref 70–99)
Glucose-Capillary: 88 mg/dL (ref 70–99)
Glucose-Capillary: 80 mg/dL (ref 70–99)

## 2022-07-17 LAB — CBC
HCT: 23 % — ABNORMAL LOW (ref 39.0–52.0)
Hemoglobin: 7.5 g/dL — ABNORMAL LOW (ref 13.0–17.0)
MCH: 24.7 pg — ABNORMAL LOW (ref 26.0–34.0)
MCHC: 32.6 g/dL (ref 30.0–36.0)
MCV: 75.7 fL — ABNORMAL LOW (ref 80.0–100.0)
Platelets: 290 10*3/uL (ref 150–400)
RBC: 3.04 MIL/uL — ABNORMAL LOW (ref 4.22–5.81)
RDW: 16.4 % — ABNORMAL HIGH (ref 11.5–15.5)
WBC: 4.8 10*3/uL (ref 4.0–10.5)
nRBC: 0 % (ref 0.0–0.2)

## 2022-07-17 NOTE — Progress Notes (Signed)
Physical Therapy Treatment Patient Details Name: Carl Bell MRN: [id number] DOB: Nov 14, 1950 Today's Date: 07/17/2022   History of Present Illness Carl Bell is a 72 y.o. male with past medical history significant for low vision, COPD with chronic respiratory failure on 2 L of oxygen, hypertension, history of lung cancer status post chemoradiation therapy, history of liver cirrhosis and hypertension who presents to the ER for evaluation of a 3-day history of constipation associated with diffuse abdominal pain. S/p Hartman's procedure for sigmoid volvulus on 06/23/22.    PT Comments    Patient supine in bed on arrival and initially agreeable to PT/OT treatment session, however become increasingly agitated as session progressed. Patient disoriented to place and situation this session. Required maxA+2 for bed mobility which feel is due to cognition vs ability to complete. Attempted multiple times to complete transfer to chair via step pivot, however patient became agitated and threatening therapists. RN notified. Continue to recommend SNF for ongoing Physical Therapy.       Recommendations for follow up therapy are one component of a multi-disciplinary discharge planning process, led by the attending physician.  Recommendations may be updated based on patient status, additional functional criteria and insurance authorization.  Follow Up Recommendations  Skilled nursing-short term rehab (<3 hours/day) Can patient physically be transported by private vehicle: Yes   Assistance Recommended at Discharge Frequent or constant Supervision/Assistance  Patient can return home with the following A little help with walking and/or transfers;A little help with bathing/dressing/bathroom;Help with stairs or ramp for entrance   Equipment Recommendations  BSC/3in1    Recommendations for Other Services       Precautions / Restrictions Precautions Precautions: Fall Precaution Comments:  blind Restrictions Weight Bearing Restrictions: No     Mobility  Bed Mobility Overal bed mobility: Needs Assistance Bed Mobility: Supine to Sit, Sit to Supine     Supine to sit: Max assist, +2 for safety/equipment Sit to supine: Max assist, +2 for safety/equipment        Transfers Overall transfer level: Needs assistance Equipment used: 2 person hand held assist Transfers: Sit to/from Stand Sit to Stand: Max assist           General transfer comment: attempted to stand x 4 but patient resistant to therapist assisting despite inability to stand    Ambulation/Gait                   Stairs             Wheelchair Mobility    Modified Rankin (Stroke Patients Only)       Balance Overall balance assessment: Needs assistance Sitting-balance support: No upper extremity supported, Feet supported Sitting balance-Leahy Scale: Good     Standing balance support: Bilateral upper extremity supported Standing balance-Leahy Scale: Poor                              Cognition Arousal/Alertness: Awake/alert Behavior During Therapy: Agitated Overall Cognitive Status: No family/caregiver present to determine baseline cognitive functioning Area of Impairment: Orientation, Safety/judgement, Attention, Following commands, Awareness, Problem solving                 Orientation Level: Disoriented to, Place, Situation Current Attention Level: Sustained Memory: Decreased short-term memory Following Commands: Follows one step commands with increased time Safety/Judgement: Decreased awareness of safety, Decreased awareness of deficits Awareness: Emergent Problem Solving: Slow processing, Decreased initiation, Requires verbal cues, Requires tactile cues General  Comments: pt becoming incresingly agitated throughout tx session        Exercises      General Comments General comments (skin integrity, edema, etc.): vial signs appear stable  throughout      Pertinent Vitals/Pain Pain Assessment Pain Assessment: Faces Faces Pain Scale: Hurts a little bit Pain Location: abdomen "you know where it hurts" Pain Descriptors / Indicators: Discomfort, Grimacing, Dull Pain Intervention(s): Monitored during session    Home Living                          Prior Function            PT Goals (current goals can now be found in the care plan section) Acute Rehab PT Goals Patient Stated Goal: to go home PT Goal Formulation: With patient Time For Goal Achievement: 07/09/22 Potential to Achieve Goals: Fair Progress towards PT goals: Progressing toward goals    Frequency    Min 2X/week      PT Plan Current plan remains appropriate    Co-evaluation              AM-PAC PT "6 Clicks" Mobility   Outcome Measure  Help needed turning from your back to your side while in a flat bed without using bedrails?: Total Help needed moving from lying on your back to sitting on the side of a flat bed without using bedrails?: Total Help needed moving to and from a bed to a chair (including a wheelchair)?: Total Help needed standing up from a chair using your arms (e.g., wheelchair or bedside chair)?: Total Help needed to walk in hospital room?: Total Help needed climbing 3-5 steps with a railing? : Total 6 Click Score: 6    End of Session Equipment Utilized During Treatment: Oxygen Activity Tolerance: Treatment limited secondary to agitation Patient left: in bed;with call bell/phone within reach;with bed alarm set Nurse Communication: Mobility status;Other (comment) (agitation) PT Visit Diagnosis: Unsteadiness on feet (R26.81);Muscle weakness (generalized) (M62.81);Difficulty in walking, not elsewhere classified (R26.2)     Time: 3335-4562 PT Time Calculation (min) (ACUTE ONLY): 21 min  Charges:  $Therapeutic Activity: 8-22 mins                     Dajour Pierpoint A. Gilford Rile PT, DPT [place] A Desyre Calma 07/17/2022, 1:17 PM

## 2022-07-17 NOTE — Progress Notes (Addendum)
Occupational Therapy Treatment Patient Details Name: Carl Bell MRN: [id number] DOB: 07-29-1950 Today's Date: 07/17/2022   History of present illness Carl Bell is a 72 y.o. male with past medical history significant for low vision, COPD with chronic respiratory failure on 2 L of oxygen, hypertension, history of lung cancer status post chemoradiation therapy, history of liver cirrhosis and hypertension who presents to the ER for evaluation of a 3-day history of constipation associated with diffuse abdominal pain. S/p Hartman's procedure for sigmoid volvulus on 06/23/22.   OT comments  Chart reviewed, pt greeted in room initially agreeable to OT/PT co tx however became increasingly agitated as tx session progressed. Tx session targeted improving functional activity tolerance for improved ADL participation. Pt oriented to self and date, not oriented to place stating that we are in the car or at the joint. Pt requires MAX A +2 for bed mobility primarily due to cognition, multiple attempts for transfer to bedside chair either via SPT or lateral scoot with pt resistive to all movement. Ultimately, tx session terminated as pt became increasingly agitated, threatening to hurt therapists with attempts at redirection unsuccessful. Pt is left in bed, all needs met, seemingly in no apparent distress after return to bed. RN notified of pt status and behavior during therapy. No bill, however one visit completed for OT portion as it was not appropriate to continue to tx session. OT will continue to follow acutely.   Of note: vital signs appear stable throughout    Recommendations for follow up therapy are one component of a multi-disciplinary discharge planning process, led by the attending physician.  Recommendations may be updated based on patient status, additional functional criteria and insurance authorization.    Follow Up Recommendations  Skilled nursing-short term rehab (<3 hours/day)      Assistance Recommended at Discharge Frequent or constant Supervision/Assistance  Patient can return home with the following  A little help with walking and/or transfers;A little help with bathing/dressing/bathroom;Help with stairs or ramp for entrance   Equipment Recommendations  BSC/3in1    Recommendations for Other Services      Precautions / Restrictions Precautions Precautions: Fall Precaution Comments: blind Restrictions Weight Bearing Restrictions: No       Mobility Bed Mobility Overal bed mobility: Needs Assistance Bed Mobility: Supine to Sit, Sit to Supine     Supine to sit: Max assist, +2 for safety/equipment Sit to supine: Max assist, +2 for safety/equipment        Transfers Overall transfer level: Needs assistance Equipment used: 2 person hand held assist Transfers: Sit to/from Stand Sit to Stand: Max assist                 Balance Overall balance assessment: Needs assistance Sitting-balance support: No upper extremity supported, Feet supported Sitting balance-Leahy Scale: Good     Standing balance support: Bilateral upper extremity supported Standing balance-Leahy Scale: Poor                             ADL either performed or assessed with clinical judgement   ADL Overall ADL's : Needs assistance/impaired                                       General ADL Comments: MAX A +53fr bed mobility (due to cognition), MAX A +2 for standing with pt resistive to mobility, increasingly  agitated    Extremity/Trunk Assessment              Vision       Perception     Praxis      Cognition Arousal/Alertness: Awake/alert Behavior During Therapy: Agitated Overall Cognitive Status: No family/caregiver present to determine baseline cognitive functioning Area of Impairment: Orientation, Safety/judgement, Attention, Following commands, Awareness, Problem solving                 Orientation Level: Disoriented  to, Place, Situation Current Attention Level: Sustained Memory: Decreased short-term memory Following Commands: Follows one step commands with increased time Safety/Judgement: Decreased awareness of safety, Decreased awareness of deficits Awareness: Emergent Problem Solving: Slow processing, Decreased initiation, Requires verbal cues, Requires tactile cues General Comments: pt becoming incresingly agitated throughout tx session        Exercises      Shoulder Instructions       General Comments vial signs appear stable throughout    Pertinent Vitals/ Pain       Pain Assessment Pain Assessment: No/denies pain Faces Pain Scale: Hurts a little bit Pain Location: abdomen "you know where it hurts" Pain Descriptors / Indicators: Discomfort, Grimacing, Dull Pain Intervention(s): Monitored during session  Home Living                                          Prior Functioning/Environment              Frequency  Min 2X/week        Progress Toward Goals  OT Goals(current goals can now be found in the care plan section)  Progress towards OT goals: Progressing toward goals     Plan Discharge plan remains appropriate;Frequency remains appropriate    Co-evaluation                 AM-PAC OT "6 Clicks" Daily Activity     Outcome Measure   Help from another person eating meals?: A Little Help from another person taking care of personal grooming?: A Lot Help from another person toileting, which includes using toliet, bedpan, or urinal?: A Lot Help from another person bathing (including washing, rinsing, drying)?: A Lot Help from another person to put on and taking off regular upper body clothing?: A Lot Help from another person to put on and taking off regular lower body clothing?: A Lot 6 Click Score: 13    End of Session Equipment Utilized During Treatment: Oxygen  OT Visit Diagnosis: Other abnormalities of gait and mobility (R26.89);Muscle  weakness (generalized) (M62.81)   Activity Tolerance Treatment limited secondary to agitation   Patient Left in bed;with call bell/phone within reach;with bed alarm set   Nurse Communication Mobility status (participation level)        Time: 1308-6578 OT Time Calculation (min): 20 min  Charges: OT General Charges $OT Visit: 1 Visit Shanon Payor, OTD OTR/L  07/17/22, 12:07 PM

## 2022-07-17 NOTE — Progress Notes (Signed)
Nutrition Follow Up Note   DOCUMENTATION CODES:   Severe malnutrition in context of chronic illness  INTERVENTION:   Ensure Enlive po TID, each supplement provides 350 kcal and 20 grams of protein.  Magic cup TID with meals, each supplement provides 290 kcal and 9 grams of protein  MVI po daily   Pt at high refeed risk; recommend monitor potassium, magnesium and phosphorus labs daily until stable  Daily weights   NUTRITION DIAGNOSIS:   Severe Malnutrition related to chronic illness as evidenced by severe fat depletion, severe muscle depletion, 27 percent weight loss in 9 months.  GOAL:   Patient will meet greater than or equal to 90% of their needs - not met   MONITOR:   PO intake, Supplement acceptance, Labs, Weight trends, Skin, I & O's  ASSESSMENT:   72 y.o. 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  Pt with intermittent poor appetite and oral intake since admission. Pt eating mainly sips and bites of meals but is documented to have eaten 50% of a couple of meals with assist. Pt is refusing most of the Ensure supplements. The only thing left that can be offered from a nutritional standpoint is G-tube and nutrition support. Pt would need to be able to care for tube and provide tube feeds for himself at home as his insurance does not cover long term care. Plan is for SNF at discharge. Pt previously on hospice care.     Per chart, pt is fairly weight stable since admission.   Medications reviewed and include: MVI, miralax  Labs reviewed:   Diet Order:   Diet Order             Diet regular Room service appropriate? Yes; Fluid consistency: Thin  Diet effective now                  EDUCATION NEEDS:   Education needs have been addressed  Skin:  Skin  Assessment: Reviewed RN Assessment (incision abdomen)  Last BM:  1/4- 63m via ostomy  Height:   Ht Readings from Last 1 Encounters:  06/23/22 _0  (1.854 m)    Weight:   Wt Readings from Last 1 Encounters:  07/07/22 55 kg    Ideal Body Weight:  83.6 kg  BMI:  Body mass index is 16 kg/m.  Estimated Nutritional Needs:   Kcal:  1700-2000kcal/day  Protein:  85-100g/day  Fluid:  1.4-1.6L/day  CKoleen DistanceMS, RD, LDN Please refer to [facility] and/or RD on-call/weekend/after hours pager

## 2022-07-17 NOTE — TOC Progression Note (Signed)
Transition of Care [facility]) - Progression Note    Patient Details  Name: Carl Bell MRN: [id number] Date of Birth: [birth year]  Transition of Care [facility]) CM/SW Contact  Beverly Sessions, RN Phone Number: 07/17/2022, 9:24 AM  Clinical Narrative:     Secure Email sent to Vaughan Basta with [name] to determine current status of auth [facility] director and supervisor on email as well   Expected Discharge Plan: [place] Barriers to Discharge: Continued Medical Work up  Expected Discharge Plan and Services     Post Acute Care Choice:  (TBD) Living arrangements for the past 2 months: Single Family Home                           HH Arranged: RN, PT, OT [facility] Agency: [place] Date [place]: 06/28/22 Time [place]: 0354 Representative spoke with at [place]: [place] Determinants of Health ([place]) Interventions [place]: No Food Insecurity (06/30/2022)  Housing: Low Risk  (06/30/2022)  Transportation Needs: No Transportation Needs (06/30/2022)  Utilities: Not At Risk (06/30/2022)  Tobacco Use: High Risk (06/24/2022)    Readmission Risk Interventions    06/24/2022   11:08 AM  Readmission Risk Prevention Plan  Transportation Screening Complete  PCP or Specialist Appt within 3-5 Days Complete  Social Work Consult for [place] Planning/Counseling [place] Not Applicable  Medication Review Press photographer) Complete

## 2022-07-17 NOTE — Progress Notes (Signed)
   07/16/22 1946  Vitals  Temp 98 F (36.7 C)  Temp Source Oral  BP 110/66  MAP (mmHg) 79  BP Location Left Arm  BP Method Automatic  Patient Position (if appropriate) Lying  Pulse Rate (!) 120  Pulse Rate Source Monitor  Resp 18  MEWS COLOR  MEWS Score Color Yellow  Oxygen Therapy  SpO2 96 %  MEWS Score  MEWS Temp 0  MEWS Systolic 0  MEWS Pulse 2  MEWS RR 0  MEWS LOC 0  MEWS Score 2   Heart rate increased to 120. No complaints of chest pain. Afebrile. EKG obtained.

## 2022-07-17 NOTE — Progress Notes (Signed)
Subjective:  CC: Carl Bell is a 72 y.o. male  Hospital stay day 25, 24 Days Post-Op Hartman's procedure for sigmoid volvulus  HPI: No complaints from patient overnight, but increasing confusion reported again.  Patient currently believes he is at a bar and they are beer cans all over the room that needs to be cleaned.    ROS:  Unable to obtain secondary to patient confusion  Objective:   Temp:  [97.6 F (36.4 C)-98 F (36.7 C)] 97.9 F (36.6 C) (01/04 0743) Pulse Rate:  [89-121] 89 (01/04 0743) Resp:  [17-19] 18 (01/04 0743) BP: (106-126)/(63-84) 110/63 (01/04 0743) SpO2:  [79 %-100 %] 79 % (01/04 0743)     Height: 6\' 1"  (185.4 cm) Weight: 55 kg BMI (Calculated): 16   Intake/Output this shift:   Intake/Output Summary (Last 24 hours) at 07/17/2022 1503 Last data filed at 07/17/2022 0900 Gross per 24 hour  Intake --  Output 375 ml  Net -375 ml    Constitutional :  alert, cooperative, appears stated age, and no distress  Respiratory:  clear to auscultation bilaterally  Cardiovascular:  regular rate and rhythm  Gastrointestinal: Soft, no guarding, ostomy intact, productive  Skin: Cool and moist.  incision healed  Psychiatric: Normal affect, non-agitated, not confused       LABS:     Latest Ref Rng & Units 07/15/2022    2:04 PM 07/12/2022    5:24 AM 07/11/2022    6:59 AM  CMP  Glucose 70 - 99 mg/dL 134  85  102   BUN 8 - 23 mg/dL 27  25  35   Creatinine 0.61 - 1.24 mg/dL 0.99  0.92  1.29   Sodium 135 - 145 mmol/L 137  141  140   Potassium 3.5 - 5.1 mmol/L 3.9  3.7  4.0   Chloride 98 - 111 mmol/L 105  112  113   CO2 22 - 32 mmol/L 24  22  23    Calcium 8.9 - 10.3 mg/dL 8.7  8.3  8.2   Total Protein 6.5 - 8.1 g/dL 5.7   4.8   Total Bilirubin 0.3 - 1.2 mg/dL 1.0   0.5   Alkaline Phos 38 - 126 U/L 51   44   AST 15 - 41 U/L 20   16   ALT 0 - 44 U/L 10   8       Latest Ref Rng & Units 07/17/2022    4:37 AM 07/15/2022    2:04 PM 07/13/2022   10:47 AM  CBC  WBC 4.0 -  10.5 K/uL 4.8  4.3    Hemoglobin 13.0 - 17.0 g/dL 7.5  7.7  7.7   Hematocrit 39.0 - 52.0 % 23.0  23.9  24.0   Platelets 150 - 400 K/uL 290  304      RADS: N/a Assessment:   Sigmoid volvulus, s/p decompression colonoscopy, subsequent Hartman's procedure, was pending SNF placement but started developing issues noted below last couple days:  Recovered well from AKI, ascites.  Nephro on hospice list signed off.  Pending SNF dispo.  Will continue Foley catheter and start Flomax per urology recommendations.  They have signed off as well  Increasing confusion again.  This time persistent and not amenable to redirection and orientation.  Patient also noted to have some nystagmus as well on exam today which is a new finding.  Will ask hospitalist to reevaluate.   Patient will follow-up in the surgery office for Ostomy care  as needed.  No lifting greater than 10 pounds, no push/pull greater than 30 pounds for 4 weeks postop.  4-6 months post op, if patient is healthy enough, can consider ostomy reversal.    labs/images/medications/previous chart entries reviewed personally and relevant changes/updates noted above.

## 2022-07-17 NOTE — Consult Note (Addendum)
Initial Consultation Note   Patient: Carl Bell MWU:[id number] DOB: 03-26-1951 PCP: Kirk Ruths, MD DOA: 06/22/2022 DOS: the patient was seen and examined on 07/17/2022 Primary service: [name], DO  Referring physician: Dr. [name]. Reason for consult: Nystagmus.   Assessment and Plan: Nystagmus  Legally blind. No obvious cause. CT head was negative. Consulted neurology. They will evaluate tomorrow  Volvulus of intestine [facility]) S/p sigmoid colectomy.   Continue management by general surgery  Acute renal failure due to urinary obstruction (HCC) Due to bilateral hydronephrosis. Likely due to urinary retention. Seen by urology earlier during this admission. Creatinine level is normal.  Hypertension Currently blood pressure within goal. -IV hydralazine as needed  COPD (chronic obstructive pulmonary disease) (HCC) With chronic respiratory failure on 2 L of oxygen continuous. Stable and not acutely exacerbated Continue as needed bronchodilator therapy as well as inhaled steroids Continue oxygen supplementation to maintain pulse oximetry greater than 92%   Primary open-angle glaucoma both eyes. Continue Cosopt and Xalatan drops.   TRH will sign off at present, please call us again when needed.  HPI: CHRISTOPHERE Bell is a 72 y.o. male with past medical history of COPD, chronic respiratory failure on 2 LPM of oxygen via [place], history of lung cancer status post chemo and radiation therapy, liver cirrhosis, hypertension, hyperlipidemia, atherosclerosis, aspiration pneumonia, glaucoma, legally blind who was admitted 25 days ago due to sigmoid volvulus undergoing sigmoid colectomy on 06/23/2022, postoperative anemia, urinary retention with bilateral moderate to severe hydronephrosis requiring Foley catheterization who is currently awaiting for placement who we are seen due to nystagmus, on and off confusion.  He told the primary team earlier today that he was at a bar,  that they were beer cans all over the room that needs to be clean.  However, when I examined the patient he was fully oriented.  He knew who his primary doctor was and the exact date of the year.  Orientation may have been helped by the presence of his brother in the room. He stated that he was having very vivid dreams and was restless while sleeping last night.  He denied fever, chills, rhinorrhea, sore throat, wheezing or hemoptysis.  No chest pain, palpitations, diaphoresis, PND, orthopnea or pitting edema of the lower extremities.  No abdominal pain, nausea, emesis, diarrhea, constipation, melena or hematochezia.  No flank pain, dysuria, frequency or hematuria.  No polyuria, polydipsia, polyphagia.  Review of Systems: As mentioned in the history of present illness. All other systems reviewed and are negative. Past Medical History:  Diagnosis Date   COPD (chronic obstructive pulmonary disease) ([place])    Emphysema of lung ([place])    Glaucoma    Hypertension    Lung cancer [facility])    Past Surgical History:  Procedure Laterality Date   COLON RESECTION SIGMOID N/A 06/23/2022   Procedure: COLON RESECTION SIGMOID;  Surgeon: [name], DO;  Location: [facility];  Service: General;  Laterality: N/A;   COLONOSCOPY N/A 06/22/2022   Procedure: COLONOSCOPY;  Surgeon: [name], DO;  Location: [facility];  Service: General;  Laterality: N/A;   COLOSTOMY  06/23/2022   Procedure: COLOSTOMY;  Surgeon: [name], DO;  Location: [facility];  Service: General;;   LOWER EXTREMITY ANGIOGRAPHY Left 07/11/2019   Procedure: LOWER EXTREMITY ANGIOGRAPHY;  Surgeon: Algernon Huxley, MD;  Location: [facility];  Service: Cardiovascular;  Laterality: Left;   LUNG BIOPSY     PERIPHERAL VASCULAR CATHETERIZATION N/A 09/24/2015   Procedure: Glori Luis  Cath Insertion;  Surgeon: Algernon Huxley, MD;  Location: [facility];  Service: Cardiovascular;  Laterality: N/A;   Port a cath placement     Social History:   reports that he has been smoking cigarettes. He has a 25.00 pack-year smoking history. He has never used smokeless tobacco. He reports current alcohol use. He reports that he does not use drugs.  No Known Allergies  Family History  Problem Relation Age of Onset   Diabetes Mellitus II Mother    Hypertension Father     Prior to Admission medications   Medication Sig Start Date End Date Taking? Authorizing Provider  dorzolamide-timolol (COSOPT) 22.3-6.8 MG/ML ophthalmic solution Place 1 drop into both eyes 2 (two) times daily. 07/10/20  Yes [provider]  latanoprost (XALATAN) 0.005 % ophthalmic solution Place 1 drop into both eyes at bedtime. 07/10/20  Yes [provider]  albuterol (VENTOLIN HFA) 108 (90 Base) MCG/ACT inhaler Inhale 2 puffs into the lungs every 6 (six) hours as needed for wheezing or shortness of breath. Patient not taking: Reported on 06/22/2022 03/20/22   Blake Divine, MD  dextromethorphan-guaiFENesin [facility] DM) 30-600 MG 12hr tablet Take 1 tablet by mouth 2 (two) times daily. Patient not taking: Reported on 12/03/2021 07/08/21   Lorella Nimrod, MD  furosemide (LASIX) 40 MG tablet Take by mouth. Patient not taking: Reported on 12/03/2021 09/25/21 09/25/22  [provider]  ipratropium-albuterol (DUONEB) 0.5-2.5 (3) MG/3ML SOLN Take 3 mLs by nebulization every 6 (six) hours as needed (Shortness of breath). Patient not taking: Reported on 06/22/2022 07/08/21   Lorella Nimrod, MD  mirtazapine (REMERON) 7.5 MG tablet Take by mouth. Patient not taking: Reported on 06/22/2022 10/23/21 10/23/22  [provider]  nicotine (NICODERM CQ - DOSED IN MG/24 HR) 7 mg/24hr patch Place 1 patch (7 mg total) onto the skin daily. Patient not taking: Reported on 12/03/2021 09/16/21   Lorella Nimrod, MD  polyethylene glycol (MIRALAX / GLYCOLAX) 17 g packet Take 17 g by mouth daily. 07/01/22   Lysle Pearl, Isami, DO  potassium chloride (KLOR-CON) 10 MEQ tablet Take by  mouth. Patient not taking: Reported on 06/22/2022 09/25/21 09/25/22  [provider]  predniSONE (DELTASONE) 10 MG tablet Take 5 tablets (50 mg total) by mouth daily. For 3 days, then take 4 tablets for next 3 days, 3 tablets for 3 days, 2 tablets for 3 days, 1 tablet for 3 days Patient not taking: Reported on 12/03/2021 09/16/21   Lorella Nimrod, MD  TRELEGY ELLIPTA 100-62.5-25 MCG/ACT AEPB Inhale 1 puff into the lungs daily. Patient not taking: Reported on 06/22/2022 05/10/21   [provider]    Physical Exam: Vitals:   07/16/22 2132 07/17/22 0205 07/17/22 0537 07/17/22 0743  BP: 112/84 126/83 106/74 110/63  Pulse: 95 (!) 121 (!) 110 89  Resp: 17 18 19 18   Temp: 98 F (36.7 C) 97.6 F (36.4 C) 98 F (36.7 C) 97.9 F (36.6 C)  TempSrc: Oral Oral    SpO2: 95% 100% 100% (!) 79%  Weight:      Height:       Physical Exam Vitals and nursing note reviewed.  Constitutional:      General: He is awake. He is not in acute distress.    Appearance: Normal appearance.     Comments: Looks underweight.  Chronically ill-appearing.  HENT:     Head: Normocephalic.     Nose: No rhinorrhea.     Mouth/Throat:     Mouth: Mucous  membranes are moist.  Eyes:     General: No scleral icterus.    Extraocular Movements:     Right eye: Nystagmus present.     Left eye: Nystagmus present.     Pupils: Pupils are equal, round, and reactive to light.  Neck:     Vascular: No JVD.  Cardiovascular:     Rate and Rhythm: Normal rate and regular rhythm.     Heart sounds: S1 normal and S2 normal.  Abdominal:     General: Bowel sounds are normal. There is no distension.     Palpations: Abdomen is soft.     Tenderness: There is no abdominal tenderness.     Comments: Healed incision.  Musculoskeletal:     Cervical back: Neck supple.     Right lower leg: No edema.     Left lower leg: No edema.  Skin:    General: Skin is warm and dry.  Neurological:     General: No focal deficit present.      Mental Status: He is alert and oriented to person, place, and time.     Cranial Nerves: No dysarthria or facial asymmetry.     Sensory: No sensory deficit.  Psychiatric:        Behavior: Behavior is cooperative.    Data Reviewed:   There are no new results to review at this time.   Family Communication:  Primary team communication: Discussed with Dr. Lysle Pearl.  Thank you very much for involving [place] in the care of your patient.  Author: Reubin Milan, MD 07/17/2022 2:07 PM  For on call review [email or web address].   This document was prepared using Dragon voice recognition software and may contain some unintended transcription errors.

## 2022-07-18 ENCOUNTER — Emergency Department (HOSPITAL_COMMUNITY)
Admission: EM | Admit: 2022-07-18 | Discharge: 2022-07-30 | Disposition: A | Discharge status: Home / Self Care | Payer: Medicare HMO | Attending: Emergency Medicine | Admitting: Emergency Medicine

## 2022-07-18 ENCOUNTER — Other Ambulatory Visit: Payer: Self-pay

## 2022-07-18 DIAGNOSIS — R4689 Other symptoms and signs involving appearance and behavior: Secondary | ICD-10-CM

## 2022-07-18 DIAGNOSIS — R456 Violent behavior: Secondary | ICD-10-CM | POA: Insufficient documentation

## 2022-07-18 DIAGNOSIS — R2681 Unsteadiness on feet: Secondary | ICD-10-CM | POA: Diagnosis not present

## 2022-07-18 DIAGNOSIS — R339 Retention of urine, unspecified: Secondary | ICD-10-CM | POA: Insufficient documentation

## 2022-07-18 DIAGNOSIS — Y732 Prosthetic and other implants, materials and accessory gastroenterology and urology devices associated with adverse incidents: Secondary | ICD-10-CM | POA: Diagnosis not present

## 2022-07-18 DIAGNOSIS — M6281 Muscle weakness (generalized): Secondary | ICD-10-CM | POA: Diagnosis not present

## 2022-07-18 DIAGNOSIS — F99 Mental disorder, not otherwise specified: Secondary | ICD-10-CM | POA: Diagnosis present

## 2022-07-18 DIAGNOSIS — T839XXA Unspecified complication of genitourinary prosthetic device, implant and graft, initial encounter: Secondary | ICD-10-CM

## 2022-07-18 DIAGNOSIS — E860 Dehydration: Secondary | ICD-10-CM

## 2022-07-18 DIAGNOSIS — T83091A Other mechanical complication of indwelling urethral catheter, initial encounter: Secondary | ICD-10-CM | POA: Diagnosis not present

## 2022-07-18 DIAGNOSIS — N179 Acute kidney failure, unspecified: Secondary | ICD-10-CM | POA: Insufficient documentation

## 2022-07-18 LAB — GLUCOSE, CAPILLARY
Glucose-Capillary: 69 mg/dL — ABNORMAL LOW (ref 70–99)
Glucose-Capillary: 148 mg/dL — ABNORMAL HIGH (ref 70–99)

## 2022-07-18 MED ORDER — ORAL CARE MOUTH RINSE
15.0000 mL | OROMUCOSAL | Status: DC
  Administered 2022-07-18: 15 mL via OROMUCOSAL

## 2022-07-18 MED ORDER — TAMSULOSIN HCL 0.4 MG PO CAPS: 0.4000 mg | ORAL_CAPSULE | Freq: Every day | ORAL | 0 refills | Status: AC

## 2022-07-18 MED ORDER — IBUPROFEN 400 MG PO TABS
600.0000 mg | ORAL_TABLET | Freq: Once | ORAL | Status: AC
  Administered 2022-07-18: 600 mg via ORAL
  Filled 2022-07-18: qty 1

## 2022-07-18 MED ORDER — ORAL CARE MOUTH RINSE: 15.0000 mL | OROMUCOSAL | Status: DC | PRN

## 2022-07-18 NOTE — Care Management Important Message (Signed)
Important Message  Patient Details  Name: Carl Bell MRN: [id number] Date of Birth: 1950/08/18   Medicare Important Message Given:  Yes     Juliann Pulse A Padraig Nhan 07/18/2022, 11:46 AM

## 2022-07-18 NOTE — TOC Transition Note (Addendum)
Transition of Care [facility]) - CM/SW Discharge Note   Patient Details  Name: Carl Bell MRN: [id number] Date of Birth: 10/16/1950  Transition of Care [facility]) CM/SW Contact:  Beverly Sessions, RN Phone Number: 07/18/2022, 11:54 AM   Clinical Narrative:     Patient will DC [facility] Anticipated DC date: 07/18/22  Family notified: Message left for [facility], and notified sister [name].  Children [name] and [name] were in agreement for patient's siblings to be point of contact  Transport by: [name] with [employer] notified of discharge   Per MD patient ready for DC to . RN, patient, patient's family, and facility notified of DC. Discharge Summary sent to facility. RN given number for report. DC packet on chart. Ambulance transport requested for patient.  TOC signing off.  [name] [facility] [phone]     Barriers to Discharge: Continued Medical Work up   Patient Goals and CMS Choice      Discharge Placement                         Discharge Plan and Services Additional resources added to the After Visit Summary for       Post Acute Care Choice:  (TBD)                    HH Arranged: RN, PT, OT HH Agency: [place] Date [place]: 06/28/22 Time [place]: 0932 Representative spoke with at [place]: [place] Determinants of Health ([place]) Interventions [place]: No Food Insecurity (06/30/2022)  Housing: Low Risk  (06/30/2022)  Transportation Needs: No Transportation Needs (06/30/2022)  Utilities: Not At Risk (06/30/2022)  Tobacco Use: High Risk (06/24/2022)     Readmission Risk Interventions    06/24/2022   11:08 AM  Readmission Risk Prevention Plan  Transportation Screening Complete  PCP or Specialist Appt within 3-5 Days Complete  Social Work Consult for [place] Planning/Counseling [place] Not Applicable   Medication Review Press photographer) Complete

## 2022-07-18 NOTE — ED Provider Triage Note (Signed)
Emergency Medicine Provider Triage Evaluation Note  Carl Bell , a 71 y.o. male  was evaluated in triage.  Patient with reported combative behavior.  Per patient, states that he had leakage around his Foley catheter earlier today.  He requested x 2 that a nurse, change his Foley catheter when his nurse apparently became aggravated and his Foley catheter was subsequently pulled by nursing staff member and not replaced per patient.  Patient with noted leakage around colostomy site.  Patient currently without complaints besides back pain from wheelchair has been sitting in in the lobby.  Review of Systems  Positive: See above Negative:   Physical Exam  BP (!) 140/127 (BP Location: Left Arm)   Pulse (!) 116   Temp 97.8 F (36.6 C)   Resp 16   SpO2 94%  Gen:   Awake, no distress   Resp:  Normal effort  MSK:   Moves extremities without difficulty  Other:    Medical Decision Making  Medically screening exam initiated at 9:31 PM.  Appropriate orders placed.  Carl Bell was informed that the remainder of the evaluation will be completed by another provider, this initial triage assessment does not replace that evaluation, and the importance of remaining in the ED until their evaluation is complete.     Wilnette Kales, [place] 07/18/22 2135

## 2022-07-18 NOTE — ED Triage Notes (Signed)
Patient arrived with EMS from [facility] staff reported combative behavior towards staff this evening and foley catheter dislodged this evening .

## 2022-07-18 NOTE — Progress Notes (Signed)
Mobility Specialist - Progress Note   07/18/22 1113  Mobility  Activity Transferred from bed to chair;Transferred from chair to bed;Stood at bedside  Level of Assistance Moderate assist, patient does 50-74%  Assistive Device Front wheel walker  Distance Ambulated (ft) 4 ft  Activity Response Tolerated well  $Mobility charge 1 Mobility   Pt supine upon entry, utilizing 2L [place]. Pt acceptable to light activity with min encouragement from author. Pt completed bed mob ModA for trunk support and sequencing to transfer EOB. Pt STS to RW MinA, Min VC for hand placement and sequencing steps. Pt sat in recliner while Chief Strategy Officer completed lien change. Pt returned EOB, MinA to return supine. Pt left supine with alarm set and needs within reach, no complaints.   Candie Mile Mobility Specialist 07/18/22 11:22 AM

## 2022-07-18 NOTE — TOC Progression Note (Signed)
Transition of Care [facility]) - Progression Note    Patient Details  Name: Carl Bell MRN: [id number] Date of Birth: 07/03/1951  Transition of Care [facility]) CM/SW Contact  Beverly Sessions, RN Phone Number: 07/18/2022, 9:11 AM  Clinical Narrative:      Email sent to Philipp Ovens, RN, BSN  Clinical Team Lead - [place]  P [phone]  F  [phone] [email or web address]  To check status of auth  TOC supervisor and [name] director included on email  Expected Discharge Plan: [place] Barriers to Discharge: Continued Medical Work up  Expected Discharge Plan and Services     Post Acute Care Choice:  (TBD) Living arrangements for the past 2 months: Single Family Home                           HH Arranged: RN, PT, OT [facility] Agency: [place] Date [place]: 06/28/22 Time [place]: 2505 Representative spoke with at [place]: [place] Determinants of Health ([place]) Interventions [place]: No Food Insecurity (06/30/2022)  Housing: Low Risk  (06/30/2022)  Transportation Needs: No Transportation Needs (06/30/2022)  Utilities: Not At Risk (06/30/2022)  Tobacco Use: High Risk (06/24/2022)    Readmission Risk Interventions    06/24/2022   11:08 AM  Readmission Risk Prevention Plan  Transportation Screening Complete  PCP or Specialist Appt within 3-5 Days Complete  Social Work Consult for [place] Planning/Counseling [place] Not Applicable  Medication Review Press photographer) Complete

## 2022-07-18 NOTE — Discharge Summary (Signed)
Physician Discharge Summary  Patient ID: Carl Bell MRN: [id number] DOB/AGE: 03-18-1951 72 y.o.  Admit date: 06/22/2022 Discharge date: 07/18/2022  Admission Diagnoses: Sigmoid volvulus  Discharge Diagnoses:  Same as above  Discharged Condition: fair  Hospital Course: admitted for above. Sigmoid volvulus, s/p decompression colonoscopy, subsequent Hartman's procedure, He was initially under hospice care but that was rescinded during this admission and after extensive discussion with family back-and-forth with his new ostomy care as well as his mother's health deteriorating, decision made to proceed with SNF placement. Was pending SNF placement but started developing issues noted below while recovering.    Acute kidney injury and pleural effusion, ascites-likely from dehydration as well as chronic urinary retention..  Recovered well from AKI with fluid resuscitation Foley catheter decompression.  Ascites resolved after 1 treatment with paracentesis.  Pleural effusion asymptomatic.  Fluid analysis of his ascites indicating no specific etiology.  Nephro, urology and hospitalist consulted for the above but signed off once resolved.  Will continue Foley catheter and start Flomax per urology recommendations.  Follow-up 1 week in clinic for voiding trial as well   Intermittent confusion- noted throughout his stay while awaiting SNF authorization.  Patient has mostly been able to be redirected but had 1 day where it was persistent and also development of nystagmus so hospitalist reconsulted and CT scan of the head performed which was negative.  Symptoms resolved by the following day and his confusion was back to baseline, as well as the nystagmus resolving as well.  He can follow-up with neurology as needed if symptoms worsen again.  Patient can follow-up in the surgery office for Ostomy care as needed.  No lifting greater than 10 pounds, no push/pull greater than 30 pounds for 4 weeks postop.  4-6  months post op, if patient is healthy enough, can consider ostomy reversal.    He will need continued care for generalized deconditioning as well as malnutrition requiring nutritional supplementation and physical therapy.  Ostomy care as needed as well due to patient and family members inability to care for it.  Otherwise patient is stable for discharge to SNF.  Otherwise continue home meds for his COPD and oxygen supplementation as needed.    Consults: nephrology, urology, and hospitalist  Discharge Exam: Blood pressure 108/68, pulse 97, temperature (!) 97.4 F (36.3 C), resp. rate 12, height 6\' 1"  (1.854 m), weight 55 kg, SpO2 100 %. General appearance: alert and no distress Eyes: Negative for nystagmus Resp: clear to auscultation bilaterally Cardio: regular rate and rhythm GI: soft, non-tender; bowel sounds normal; no masses,  no organomegaly and ostomy productive.  Disposition:  Discharge disposition: 03-Skilled Nursing Facility       Discharge Instructions     Discharge patient   Complete by: As directed    Discharge disposition: 03-Skilled [place]   Discharge patient date: 07/18/2022      Allergies as of 07/18/2022   No Known Allergies      Medication List     STOP taking these medications    albuterol 108 (90 Base) MCG/ACT inhaler Commonly known as: VENTOLIN HFA   dextromethorphan-guaiFENesin 30-600 MG 12hr tablet Commonly known as: MUCINEX DM   furosemide 40 MG tablet Commonly known as: LASIX   ipratropium-albuterol 0.5-2.5 (3) MG/3ML Soln Commonly known as: DUONEB   mirtazapine 7.5 MG tablet Commonly known as: REMERON   nicotine 7 mg/24hr patch Commonly known as: NICODERM CQ - dosed in mg/24 hr   potassium chloride 10 MEQ tablet Commonly known  as: KLOR-CON   predniSONE 10 MG tablet Commonly known as: DELTASONE   Trelegy Ellipta 100-62.5-25 MCG/ACT Aepb Generic drug: Fluticasone-Umeclidin-Vilant       TAKE these medications     dorzolamide-timolol 2-0.5 % ophthalmic solution Commonly known as: COSOPT Place 1 drop into both eyes 2 (two) times daily.   latanoprost 0.005 % ophthalmic solution Commonly known as: XALATAN Place 1 drop into both eyes at bedtime.   polyethylene glycol 17 g packet Commonly known as: MIRALAX / GLYCOLAX Take 17 g by mouth daily.   tamsulosin 0.4 MG Caps capsule Commonly known as: FLOMAX Take 1 capsule (0.4 mg total) by mouth daily for 14 days. Start taking on: July 19, 2022               Durable Medical Equipment  (From admission, onward)           Start     Ordered   06/30/22 1558  For home use only DME oxygen  Once       Question Answer Comment  Length of Need Lifetime   Mode or (Route) Nasal cannula   Frequency Continuous (stationary and portable oxygen unit needed)   Oxygen delivery system Gas      06/30/22 1558   06/29/22 1206  For home use only DME 3 n 1  Once        06/29/22 1205            Contact information for follow-up providers     Lysle Pearl, [place] Klutts, DO Follow up in 6 month(s).   Specialty: Surgery Why: Patient will follow-up in the surgery office for Ostomy care as needed.  No lifting greater than 10 pounds, no push pull greater than 30 pounds for 4 weeks postop.  4-6 months post op, if patient is healthy enough, can consider ostomy reversal. Contact information: [place] [address] [phone]         Billey Co, MD Follow up in 1 week(s).   Specialty: Urology Why: urinary retention, s/p catheter placement. needs voiding trial.  on flomax Contact information: [place] [address] [phone]              Contact information for after-discharge care     Destination     [facility] Preferred SNF .   Service: Skilled Nursing Contact information: [place] [phone]                      Total time spent  arranging discharge was >45min. Signed: [name] 07/18/2022, 11:14 AM

## 2022-07-19 ENCOUNTER — Encounter (HOSPITAL_COMMUNITY): Payer: Self-pay | Admitting: Emergency Medicine

## 2022-07-19 MED ORDER — LATANOPROST 0.005 % OP SOLN
1.0000 [drp] | Freq: Every day | OPHTHALMIC | Status: DC
  Administered 2022-07-21 – 2022-07-29 (×9): 1 [drp] via OPHTHALMIC
  Filled 2022-07-19: qty 2.5

## 2022-07-19 MED ORDER — TAMSULOSIN HCL 0.4 MG PO CAPS
0.4000 mg | ORAL_CAPSULE | Freq: Every day | ORAL | Status: DC
  Administered 2022-07-19 – 2022-07-30 (×9): 0.4 mg via ORAL
  Filled 2022-07-19 (×10): qty 1

## 2022-07-19 MED ORDER — ACETAMINOPHEN 325 MG PO TABS
650.0000 mg | ORAL_TABLET | ORAL | Status: DC | PRN
  Administered 2022-07-26: 650 mg via ORAL
  Filled 2022-07-19: qty 2

## 2022-07-19 MED ORDER — POLYETHYLENE GLYCOL 3350 17 G PO PACK
17.0000 g | PACK | Freq: Every day | ORAL | Status: DC
  Administered 2022-07-21 – 2022-07-30 (×6): 17 g via ORAL
  Filled 2022-07-19 (×8): qty 1

## 2022-07-19 MED ORDER — DORZOLAMIDE HCL-TIMOLOL MAL 2-0.5 % OP SOLN
1.0000 [drp] | Freq: Two times a day (BID) | OPHTHALMIC | Status: DC
  Administered 2022-07-21 – 2022-07-30 (×17): 1 [drp] via OPHTHALMIC
  Filled 2022-07-19 (×3): qty 10

## 2022-07-19 NOTE — ED Notes (Signed)
Son Lydia Guiles [phone] would like an update asap and he's the son the patient is asking for if the patient is asking for his son

## 2022-07-19 NOTE — ED Notes (Signed)
Pt ostomy bag changed and pt cleaned

## 2022-07-19 NOTE — Care Management (Addendum)
Asked to speak to patient as he is stating he wants a cab and is going home. Spoke to patient at bedside.  Patient states he can go to his mothers house, sister has already stated she cannot take care of him, he said (oh). He does not have keys to get in his house.  He asked if we could call His son Marzetta Board, but then he said he would be at work. Called number for stacy.Mailbox is currently full  texted this number for him to call this RNCM back.  Told patient that he did not answer phone. Asked to call Gaspar Bidding, brother called home phone left confidential voice message. Cell phone- no mailbox set up. Texted number to call this RNCM back. Called Braylee Bosher, left confidential message on voicemail.  Patient wanted to see if he had his wallet. Gave permission  to look through bags. Just clothes in bags, no keys.  51 Talked to [facility] patient sister.  Discussed patients history. He has consumed alcohol for a number of years, not to excess but often.   Stopped smoking last year. Mother was at home still cooking for patient, Mother is now staying with her and she cannot take on her brother as well. . She states that patient is very  up or down behavior wise. she suggests  that maybe there is some medication to stabilize him.  Brother bryan called back. He has been working on Assisted living for the patient. He is caring for his wife currently and cannot care for his brother. He is willing to speak to him about things and try to visit when he can The patient is not safe to go home, he does have some fluctuating mental status changes that perhaps should be assessed. He has not been on any mood stabilizer. Patient was still trying to find a way to get home, however understands he cannot get in and no- one is home  Remains in the ED as a boarder for now

## 2022-07-19 NOTE — Progress Notes (Addendum)
1:20pm: CSW sent patient's clinicals out to facilities for review to obtain bed offers.  10:20am: CSW spoke with patient at bedside who states he does not want to return to [facility] and wants to go home. Patient understands he has physical limitations that prohibit him from being fully independent including impaired vision and lack of mobility. Patient reports he got into a verbal altercation with a staff member at [facility] and requested to be sent to the hospital. Patient reports he has weekly visits from a Hospice RN.  CSW spoke with patient's sister Olin Hauser who states the patient lives with his mother. Olin Hauser reports she is the current caregiver for their mother as she has a heel ulcer and cannot care for the patient. Olin Hauser request CSW contact patient's brother Gaspar Bidding to discuss discharge.  CSW attempted to reach [place] on his mobile and home numbers without success - a text message was sent requesting a return call.  Madilyn Fireman, MSW, LCSW Transitions of Care  Clinical Social Worker II [phone]

## 2022-07-19 NOTE — ED Provider Notes (Signed)
[facility] EMERGENCY DEPARTMENT Provider Note   CSN: [id number] Arrival date & time: 07/18/22  2036     History  Chief Complaint  Patient presents with   Behavior Problems    Carl Bell is a 72 y.o. male.  The history is provided by the patient.  Patient presents from nursing facility due to combativeness.  Patient was just discharged from the hospital on January 5 after having surgery.  Patient was sent to a skilled nurse facility, but he reports they were ignoring his requests as he had issues with the Foley catheter.  EMS had reported patient was combative towards staff. Patient tells me the nursing staff was playing cards and ignoring his request. He has no complaints at this time.  No pain complaints.    Home Medications Prior to Admission medications   Medication Sig Start Date End Date Taking? Authorizing Provider  dorzolamide-timolol (COSOPT) 22.3-6.8 MG/ML ophthalmic solution Place 1 drop into both eyes 2 (two) times daily. 07/10/20   [provider]  latanoprost (XALATAN) 0.005 % ophthalmic solution Place 1 drop into both eyes at bedtime. 07/10/20   [provider]  polyethylene glycol (MIRALAX / GLYCOLAX) 17 g packet Take 17 g by mouth daily. 07/01/22   Lysle Pearl, Isami, DO  tamsulosin (FLOMAX) 0.4 MG CAPS capsule Take 1 capsule (0.4 mg total) by mouth daily for 14 days. 07/19/22 08/02/22  [name], DO      Allergies    Patient has no known allergies.    Review of Systems   Review of Systems  Gastrointestinal:  Negative for abdominal pain.    Physical Exam Updated Vital Signs BP 111/64   Pulse 90   Temp 97.7 F (36.5 C) (Oral)   Resp 13   SpO2 100%  Physical Exam CONSTITUTIONAL: Elderly, frail and cachectic HEAD: Normocephalic/atraumatic EYES: EOM ABDOMEN: soft, colostomy noted.  No tenderness noted to his abdominal wall GU: Patient has large chronic hernia noted.  No tenderness to the scrotum NEURO: Pt is  awake/alert/appropriate, moves all extremitiesx4.  No facial droop.   EXTREMITIES: pulses normal/equal, full ROM SKIN: warm, color normal   ED Results / Procedures / Treatments   Labs (all labs ordered are listed, but only abnormal results are displayed) Labs Reviewed - No data to display  EKG None  Radiology CT HEAD WO CONTRAST (5MM)  Result Date: 07/17/2022 CLINICAL DATA:  Mental status change of unknown etiology. New onset nystagmus EXAM: CT HEAD WITHOUT CONTRAST TECHNIQUE: Contiguous axial images were obtained from the base of the skull through the vertex without intravenous contrast. RADIATION DOSE REDUCTION: This exam was performed according to the departmental dose-optimization program which includes automated exposure control, adjustment of the mA and/or kV according to patient size and/or use of iterative reconstruction technique. COMPARISON:  MRI 10/04/2015 FINDINGS: Brain: No evidence of acute infarction, hemorrhage, hydrocephalus, extra-axial collection or mass lesion/mass effect. There is mild diffuse low-attenuation within the subcortical and periventricular white matter compatible with chronic microvascular disease. Vascular: No hyperdense vessel or unexpected calcification. Skull: Normal. Negative for fracture or focal lesion. Sinuses/Orbits: Paranasal sinuses and mastoid air cells are clear. Other: None. IMPRESSION: 1. No acute intracranial abnormalities. 2. Mild chronic microvascular disease. Electronically Signed   By: Kerby Moors M.D.   On: 07/17/2022 15:27    Procedures Procedures    Medications Ordered in ED Medications  ibuprofen (ADVIL) tablet 600 mg (600 mg Oral Given 07/18/22 2156)    ED Course/ Medical Decision Making/ A&P  Clinical Course as of 07/19/22 0556  Sat Jul 19, 2022  0555 Patient resting comfortably.  He is refusing a Foley.  He reports he can urinate on his own.  He had no clinical signs of obstruction.  Bladder scan revealed less than 30 cc of  urine in his bladder.  He denies any other complaints.  He has no abdominal tenderness.  Colostomy reveals brown stool.  He does have some episodes of confusion, but this has been seen previously on recent admission.  At this point he is safe to go back to the SNF. [DW]    Clinical Course User Index [DW] Ripley Fraise, MD                           Medical Decision Making  Patient presents for evaluation for combativeness. Per records, patient was just discharged in the hospital January 5 for sigmoid volvulus.  Patient had decompression by colonoscopy and then a Hartmann procedure. Patient also had episodes of AKI and urinary retention.  He was discharged with a Foley catheter and was post to keep it in for over a week.  It appears at some point at the facility, the Foley has come out.  This will be replaced. It is reported patient had some confusion while he was in the hospital.  He had a CT head that was negative on January 4. It is possible he had some confusion when he was in the new facility.  However now he is awake and alert and not combative. Plan will be to replace his Foley catheter and discharge back to the SNF       Final Clinical Impression(s) / ED Diagnoses Final diagnoses:  Combative behavior  Complication of Foley catheter, initial encounter [facility])    Rx / DC Orders ED Discharge Orders     None         Ripley Fraise, MD 07/19/22 [phone]

## 2022-07-19 NOTE — ED Notes (Signed)
Patient reports he does not want this RN to call Lydia Guiles and give him an update.  Patient's wishes will be respected.

## 2022-07-19 NOTE — Discharge Instructions (Addendum)
Patient refused a Foley catheter.  He has no signs of urine obstruction.  He can follow-up with urology next week

## 2022-07-19 NOTE — ED Notes (Signed)
Called [facility] to transport patient back to [facility]

## 2022-07-19 NOTE — ED Notes (Signed)
Pt requesting a cab and be taken to front of hospital to leave.

## 2022-07-19 NOTE — ED Provider Notes (Signed)
Nursing home was refusing to take patient back.  Patient was just placed at [facility] Patient also reports he does not want to go back there. Will consult case management for further placement   Ripley Fraise, MD 07/19/22 [phone]

## 2022-07-19 NOTE — ED Notes (Signed)
[facility] will not accept this patient back. This RN informed the EDP and the charge nurse. Will continue to monitor.

## 2022-07-19 NOTE — ED Notes (Signed)
Pt refused to have foley replaced. EDP aware and spoke with patient. Per EDP he will discharge pt back to his facility and let them handle what needs to be done next.

## 2022-07-20 NOTE — ED Notes (Signed)
Patient refusing to eat, yelling out for [facility], some liquor, and a smoke.  Patient re-oriented easily.
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
## 2022-07-20 NOTE — ED Notes (Signed)
Patient now having conversations with himself, laughing pleasantly.

## 2022-07-20 NOTE — ED Notes (Signed)
Patient awake, talking to staff and family members in the hallway, asking them to help him get out of his truck.  Patient re-oriented easily.

## 2022-07-21 LAB — URINALYSIS, ROUTINE W REFLEX MICROSCOPIC
Bilirubin Urine: NEGATIVE
Glucose, UA: NEGATIVE mg/dL
Ketones, ur: 20 mg/dL — AB
Leukocytes,Ua: NEGATIVE
Nitrite: POSITIVE — AB
Protein, ur: 30 mg/dL — AB
Specific Gravity, Urine: 1.019 (ref 1.005–1.030)
pH: 5 (ref 5.0–8.0)

## 2022-07-21 LAB — BASIC METABOLIC PANEL
Anion gap: 16 — ABNORMAL HIGH (ref 5–15)
BUN: 20 mg/dL (ref 8–23)
CO2: 18 mmol/L — ABNORMAL LOW (ref 22–32)
Calcium: 8.4 mg/dL — ABNORMAL LOW (ref 8.9–10.3)
Chloride: 109 mmol/L (ref 98–111)
Creatinine, Ser: 1.21 mg/dL (ref 0.61–1.24)
GFR, Estimated: >60 mL/min (ref >60)
Glucose, Bld: 88 mg/dL (ref 70–99)
Potassium: 3.7 mmol/L (ref 3.5–5.1)
Sodium: 143 mmol/L (ref 135–145)

## 2022-07-21 LAB — CBC
HCT: 24.9 % — ABNORMAL LOW (ref 39.0–52.0)
Hemoglobin: 7.7 g/dL — ABNORMAL LOW (ref 13.0–17.0)
MCH: 24.1 pg — ABNORMAL LOW (ref 26.0–34.0)
MCHC: 30.9 g/dL (ref 30.0–36.0)
MCV: 77.8 fL — ABNORMAL LOW (ref 80.0–100.0)
Platelets: 368 10*3/uL (ref 150–400)
RBC: 3.2 MIL/uL — ABNORMAL LOW (ref 4.22–5.81)
RDW: 16.6 % — ABNORMAL HIGH (ref 11.5–15.5)
WBC: 9.7 10*3/uL (ref 4.0–10.5)
nRBC: 0.2 % (ref 0.0–0.2)

## 2022-07-21 MED ORDER — SODIUM CHLORIDE 0.9 % IV BOLUS (SEPSIS)
1000.0000 mL | Freq: Once | INTRAVENOUS | Status: AC
  Administered 2022-07-21: 1000 mL via INTRAVENOUS

## 2022-07-21 MED ORDER — RISPERIDONE 1 MG PO TBDP
2.0000 mg | ORAL_TABLET | Freq: Three times a day (TID) | ORAL | Status: DC | PRN
  Filled 2022-07-21: qty 2

## 2022-07-21 MED ORDER — ZIPRASIDONE MESYLATE 20 MG IM SOLR: 20.0000 mg | INTRAMUSCULAR | Status: DC | PRN

## 2022-07-21 MED ORDER — CEPHALEXIN 250 MG PO CAPS
500.0000 mg | ORAL_CAPSULE | Freq: Two times a day (BID) | ORAL | Status: DC
  Administered 2022-07-21 – 2022-07-22 (×3): 500 mg via ORAL
  Filled 2022-07-21 (×3): qty 2

## 2022-07-21 MED ORDER — LORAZEPAM 1 MG PO TABS: 1.0000 mg | ORAL_TABLET | ORAL | Status: DC | PRN

## 2022-07-21 NOTE — ED Notes (Signed)
Patient ate 3 bites of pudding and refused all other food and drank a full cup of water.

## 2022-07-21 NOTE — ED Notes (Signed)
Patients son Remo Lipps called and said stacy is not suitable to take care of father and neither is he due to dialysis and soon to have kidney transplant.  Reports he is going to have to know how to do his own colostomy bag and care of self some.

## 2022-07-21 NOTE — ED Notes (Signed)
Patient now quiet, resting comfortably.  Respirations even and unlabored, patient in NAD at this time.

## 2022-07-21 NOTE — ED Provider Notes (Signed)
Patient awaiting placement.  He has had episodes of confusion, yelling out at staff.  However he has been re- oriented easily and has not been combative. He is awaiting placement   Ripley Fraise, MD 07/21/22 [phone]

## 2022-07-21 NOTE — ED Notes (Signed)
Carl Bell son [phone]

## 2022-07-21 NOTE — ED Provider Notes (Signed)
Emergency Medicine Observation Re-evaluation Note  Carl Bell is a 72 y.o. male, seen on rounds today.  Pt initially presented to the ED for complaints of Behavior Problems Currently, the patient is resting comfortably without agitation.  Physical Exam  BP (!) 135/94 (BP Location: Left Arm)   Pulse (!) 108   Temp 97.8 F (36.6 C) (Oral)   Resp (!) 22   Ht 6\' 1"  (1.854 m)   Wt 55 kg   SpO2 94%   BMI 16.00 kg/m  Physical Exam General: Resting in exam room Cardiac: No murmur on my exam Lungs: Clear breath sounds bilaterally Psych: No agitation reported  ED Course / MDM  EKG:   I have reviewed the labs performed to date as well as medications administered while in observation.  Recent changes in the last 24 hours include patient reportedly was given fluids overnight and discussion was had about admission but patient did not meet inpatient criteria at that time..  Plan  Current plan is for still awaiting placement.    Carl Bell, Gwenyth Allegra, MD 07/21/22 [phone]

## 2022-07-21 NOTE — ED Provider Notes (Signed)
It was noted the patient was having urinary retention.  He has had very minimal urine output while boarding in the ER.  He did refuse Foley catheter previously.  Bladder scan revealed bladder volume around 500.  Patient became amenable to Foley catheter which was placed by nursing staff.  Will also recheck his labs.   Ripley Fraise, MD 07/21/22 [phone]

## 2022-07-21 NOTE — ED Notes (Signed)
Patient has a hard time chewing regular diet food, changed to mechanical soft.

## 2022-07-21 NOTE — NC FL2 (Signed)
[place] MEDICAID FL2 LEVEL OF CARE FORM     IDENTIFICATION  Patient Name: Carl Bell Birthdate: 08-Aug-1950 Sex: male Admission Date (Current Location): 07/18/2022  [facility] and [place] Number:  Herbalist and Address:  [facility] [place] [facility]. [facility], [place] [address], [place], [place] [address]      Provider Number: [id number]  Attending Physician Name and Address:  Default, Provider, MD  Relative Name and Phone Number:  Remo Lipps son [phone]    Current Level of Care: Hospital Recommended Level of Care: [place] Prior Approval Number:    Date Approved/Denied:   PASRR Number: [id number]  Discharge Plan: SNF    Current Diagnoses: Patient Active Problem List   Diagnosis Date Noted   Nystagmus 07/17/2022   Urinary retention due to benign prostatic hyperplasia 07/12/2022   Cirrhosis of liver with ascites ([place]) 07/12/2022   Anasarca associated with disorder of kidney 07/10/2022   Acute renal failure due to urinary obstruction ([place]) 07/10/2022   Sigmoid volvulus ([place]) 06/28/2022   Acute postoperative anemia due to expected blood loss 06/24/2022   Hyperkalemia 06/24/2022   Volvulus of intestine ([place]) 06/22/2022   Hypokalemia 06/22/2022   Aspiration pneumonia ([place]) 09/14/2021   COVID 08/04/2020   Demand ischemia 08/04/2020   SOB (shortness of breath) 08/04/2020   Primary open angle glaucoma (POAG) of both eyes, severe stage 05/13/2020   Legally blind 05/13/2020   COPD exacerbation ([place]) 11/08/2019   Acute exacerbation of chronic obstructive pulmonary disease (COPD) ([place]) 10/18/2019   Hypertensive urgency 10/17/2019   COPD with acute exacerbation ([place]) 09/12/2019   Acute on chronic respiratory failure with hypoxia and hypercapnia ([place]) 09/12/2019   Tobacco abuse 09/12/2019   PAD (peripheral artery disease) ([place]) 09/12/2019   HLD (hyperlipidemia) 09/12/2019   Atherosclerosis of native arteries of the extremities with ulceration ([place])  06/28/2019   Sepsis ([place]) 12/24/2017   Protein-calorie malnutrition, severe 11/12/2017   Acute respiratory failure ([place]) 11/10/2017   Acute respiratory distress 12/22/2015   COPD (chronic obstructive pulmonary disease) ([place]) 12/22/2015   Acute bronchitis 12/22/2015   Hypertension 12/22/2015   Lung cancer, lingula ([place]) 09/23/2015   Chronic edema 09/15/2014    Orientation RESPIRATION BLADDER Height & Weight     Self  Normal Indwelling catheter Weight: 121 lb 4.1 oz (55 kg) Height:  6\' 1"  (185.4 cm)  BEHAVIORAL SYMPTOMS/MOOD NEUROLOGICAL BOWEL NUTRITION STATUS      Colostomy Diet (Regular)  AMBULATORY STATUS COMMUNICATION OF NEEDS Skin   Extensive Assist Verbally Normal                       Personal Care Assistance Level of Assistance    Bathing Assistance: Maximum assistance Feeding assistance: Limited assistance Dressing Assistance: Maximum assistance     Functional Limitations Info  Sight, Hearing, Speech Sight Info:  (Can't see well) Hearing Info: Adequate Speech Info: Adequate    SPECIAL CARE FACTORS FREQUENCY                       Contractures Contractures Info: Not present    Additional Factors Info  Code Status, Allergies Code Status Info: Full Allergies Info: No known listed           Current Medications (07/21/2022):  This is the current hospital active medication list Current Facility-Administered Medications  Medication Dose Route Frequency Provider Last Rate Last Admin   acetaminophen (TYLENOL) tablet 650 mg  650 mg Oral Q4H PRN Wickline,  Elenore Rota, MD       cephALEXin (KEFLEX) capsule 500 mg  500 mg Oral BID Ripley Fraise, MD       dorzolamide-timolol (COSOPT) 2-0.5 % ophthalmic solution 1 drop  1 drop Both Eyes BID Ripley Fraise, MD       latanoprost (XALATAN) 0.005 % ophthalmic solution 1 drop  1 drop Both Eyes QHS Ripley Fraise, MD       risperiDONE (RISPERDAL M-TABS) disintegrating tablet 2 mg  2 mg Oral Q8H PRN Ripley Fraise, MD       And   LORazepam (ATIVAN) tablet 1 mg  1 mg Oral PRN Ripley Fraise, MD       And   ziprasidone (GEODON) injection 20 mg  20 mg Intramuscular PRN Ripley Fraise, MD       polyethylene glycol (MIRALAX / GLYCOLAX) packet 17 g  17 g Oral Daily Ripley Fraise, MD       tamsulosin [facility]) capsule 0.4 mg  0.4 mg Oral Daily Ripley Fraise, MD   0.4 mg at 07/19/22 5364   Current Outpatient Medications  Medication Sig Dispense Refill   dorzolamide-timolol (COSOPT) 22.3-6.8 MG/ML ophthalmic solution Place 1 drop into both eyes 2 (two) times daily.     latanoprost (XALATAN) 0.005 % ophthalmic solution Place 1 drop into both eyes at bedtime.     polyethylene glycol (MIRALAX / GLYCOLAX) 17 g packet Take 17 g by mouth daily. (Patient taking differently: Take 17 g by mouth daily as needed for mild constipation.) 14 each 0   tamsulosin (FLOMAX) 0.4 MG CAPS capsule Take 1 capsule (0.4 mg total) by mouth daily for 14 days. 14 capsule 0     Discharge Medications: Please see discharge summary for a list of discharge medications.  Relevant Imaging Results:  Relevant Lab Results:   Additional Information SS#: [id number]. Patient has impaired vision  [employer], LCSWA

## 2022-07-21 NOTE — Progress Notes (Signed)
PT consult pending

## 2022-07-21 NOTE — Progress Notes (Signed)
TOC CSW spoke with pts son, Loralie Champagne and daughter-in-law, Sharen Hones [phone].  They were not aware of pt being moved from [facility] to [facility] and currently here.  They are in agreeance to pt going to a SNF, but will return to their home.  [name] and [name] are currently moving, but would be settled into their new residence by the time pt is released from SNF.  [name] and [name] will be by to visit 07/22/2022.  Cord Wilczynski Tarpley-Carter, MSW, LCSW-A Pronouns:  She/Her/Hers [facility] HealthTransitions of Care Clinical Social Worker Direct Number:  [phone] [email or web address]

## 2022-07-21 NOTE — Evaluation (Signed)
Physical Therapy Evaluation Patient Details Name: Carl Bell MRN: [id number] DOB: 1951/02/21 Today's Date: 07/21/2022  History of Present Illness  Carl Bell is a 72 y.o. male who presents 07/18/2022 with combativeness from SNF. S/p Hartman's procedure for sigmoid volvulus on 06/23/22. Past medical history significant for low vision, COPD with chronic respiratory failure on 2 L of oxygen, hypertension, history of lung cancer status post chemoradiation therapy, history of liver cirrhosis and hypertension.  Clinical Impression  Pt admitted from [facility] after recent hospital admission. Pt calm and cooperative during physical therapy evaluation. Demonstrates generalized weakness, impaired balance, poor cognition and decreased activity tolerance. Pt requiring moderate assist for bed mobility and transfers to standing using RW. Unable to take steps. Recommend return to SNF due to level of deficits and decreased caregiver support.     Recommendations for follow up therapy are one component of a multi-disciplinary discharge planning process, led by the attending physician.  Recommendations may be updated based on patient status, additional functional criteria and insurance authorization.  Follow Up Recommendations Skilled nursing-short term rehab (<3 hours/day) Can patient physically be transported by private vehicle: No    Assistance Recommended at Discharge Frequent or constant Supervision/Assistance  Patient can return home with the following  Help with stairs or ramp for entrance;A lot of help with walking and/or transfers;A lot of help with bathing/dressing/bathroom    Equipment Recommendations BSC/3in1  Recommendations for Other Services       Functional Status Assessment Patient has had a recent decline in their functional status and demonstrates the ability to make significant improvements in function in a reasonable and predictable amount of time.     Precautions / Restrictions  Precautions Precautions: Fall;Other (comment) Precaution Comments: colostomy, blind Restrictions Weight Bearing Restrictions: No      Mobility  Bed Mobility Overal bed mobility: Needs Assistance Bed Mobility: Supine to Sit, Sit to Supine     Supine to sit: Mod assist Sit to supine: Mod assist   General bed mobility comments: Pt requiring modA for trunk to elevate up to sitting position from stretcher. Assist for LE management back to supine position    Transfers Overall transfer level: Needs assistance Equipment used: Rolling walker (2 wheels) Transfers: Sit to/from Stand Sit to Stand: Mod assist, +2 safety/equipment           General transfer comment: ModA to stand from edge of stretcher, hand over hand guidance for locating handles of walker, anterior foot block provided. Pt utilizing narrow BOS. Unable to weight shift to take steps    Ambulation/Gait                  Stairs            Wheelchair Mobility    Modified Rankin (Stroke Patients Only)       Balance Overall balance assessment: Needs assistance Sitting-balance support: No upper extremity supported, Feet supported Sitting balance-Leahy Scale: Fair     Standing balance support: Bilateral upper extremity supported Standing balance-Leahy Scale: Poor                               Pertinent Vitals/Pain Pain Assessment Pain Assessment: Faces Faces Pain Scale: Hurts a little bit Pain Location: bilateral feet Pain Descriptors / Indicators: Discomfort Pain Intervention(s): Monitored during session    Home Living Family/patient expects to be discharged to:: Private residence Living Arrangements: Parent Available Help at Discharge: Family Type of  Home: House Home Access: Stairs to enter Entrance Stairs-Rails: Chemical engineer of Steps: 4   Home Layout: One level        Prior Function Prior Level of Function : Independent/Modified Independent              Mobility Comments: household ambulator ADLs Comments: limited by low vision. assist for IADLs     Hand Dominance   Dominant Hand: Right    Extremity/Trunk Assessment   Upper Extremity Assessment Upper Extremity Assessment: Defer to OT evaluation    Lower Extremity Assessment Lower Extremity Assessment: Generalized weakness    Cervical / Trunk Assessment Cervical / Trunk Assessment: Kyphotic  Communication   Communication: No difficulties  Cognition Arousal/Alertness: Awake/alert Behavior During Therapy: Flat affect Overall Cognitive Status: No family/caregiver present to determine baseline cognitive functioning Area of Impairment: Orientation, Safety/judgement, Attention, Following commands, Awareness, Problem solving                 Orientation Level: Disoriented to, Situation, Place, Time Current Attention Level: Sustained Memory: Decreased short-term memory Following Commands: Follows one step commands with increased time Safety/Judgement: Decreased awareness of safety, Decreased awareness of deficits Awareness: Emergent Problem Solving: Slow processing, Decreased initiation, Requires verbal cues, Requires tactile cues General Comments: pt reporting he was in [place], could not state month/year        General Comments      Exercises     Assessment/Plan    PT Assessment Patient needs continued PT services  PT Problem List Decreased strength;Decreased activity tolerance;Decreased balance;Decreased mobility       PT Treatment Interventions DME instruction;Gait training;Therapeutic exercise    PT Goals (Current goals can be found in the Care Plan section)  Acute Rehab PT Goals Patient Stated Goal: to go home PT Goal Formulation: With patient Time For Goal Achievement: 08/04/22 Potential to Achieve Goals: Fair    Frequency Min 2X/week     Co-evaluation               AM-PAC PT "6 Clicks" Mobility  Outcome Measure Help needed turning  from your back to your side while in a flat bed without using bedrails?: A Little Help needed moving from lying on your back to sitting on the side of a flat bed without using bedrails?: A Lot Help needed moving to and from a bed to a chair (including a wheelchair)?: A Lot Help needed standing up from a chair using your arms (e.g., wheelchair or bedside chair)?: A Lot Help needed to walk in hospital room?: Total Help needed climbing 3-5 steps with a railing? : Total 6 Click Score: 11    End of Session Equipment Utilized During Treatment: Gait belt Activity Tolerance: Patient tolerated treatment well Patient left: in bed;with call bell/phone within reach;with bed alarm set Nurse Communication: Mobility status PT Visit Diagnosis: Unsteadiness on feet (R26.81);Muscle weakness (generalized) (M62.81);Difficulty in walking, not elsewhere classified (R26.2)    Time: 4315-4008 PT Time Calculation (min) (ACUTE ONLY): 21 min   Charges:   PT Evaluation $PT Eval Low Complexity: [place], PT, DPT Acute Rehabilitation Services Office [phone]   Deno Etienne 07/21/2022, 4:49 PM

## 2022-07-21 NOTE — Evaluation (Signed)
Occupational Therapy Evaluation Patient Details Name: Carl Bell MRN: [id number] DOB: 17-Oct-1950 Today's Date: 07/21/2022   History of Present Illness Carl Bell is a 72 y.o. male who presents 07/18/2022 with combativeness from SNF. S/p Hartman's procedure for sigmoid volvulus on 06/23/22. Past medical history significant for low vision, COPD with chronic respiratory failure on 2 L of oxygen, hypertension, history of lung cancer status post chemoradiation therapy, history of liver cirrhosis and hypertension.   Clinical Impression   Pt was evaluated s/p the above admission list, he presents from SNF. PLOF at SNF unknown as pt has impaired cognition. Overall he was limited by impaired cognition, weakness, baseline low vision, poor balance, and limited activity tolerance. Overall he required mod A to get to the EOB, once sitting pt declined further mobility. Bed level ADL and strength assessment completed. Due to the deficits listed below, he requires min A for UB ADLs and max A for LB ADLs. OT to continue to follow acutely. Recommend pt d/c back to SNF for continued therapy.    Recommendations for follow up therapy are one component of a multi-disciplinary discharge planning process, led by the attending physician.  Recommendations may be updated based on patient status, additional functional criteria and insurance authorization.   Follow Up Recommendations  Skilled nursing-short term rehab (<3 hours/day)     Assistance Recommended at Discharge Frequent or constant Supervision/Assistance  Patient can return home with the following Help with stairs or ramp for entrance;A lot of help with walking and/or transfers;A lot of help with bathing/dressing/bathroom;Assistance with cooking/housework;Direct supervision/assist for medications management;Direct supervision/assist for financial management;Assist for transportation    Functional Status Assessment  Patient has had a recent decline in their  functional status and demonstrates the ability to make significant improvements in function in a reasonable and predictable amount of time.  Equipment Recommendations  None recommended by OT    Recommendations for Other Services       Precautions / Restrictions Precautions Precautions: Fall;Other (comment) Precaution Comments: colostomy, blind Restrictions Weight Bearing Restrictions: No      Mobility Bed Mobility Overal bed mobility: Needs Assistance Bed Mobility: Supine to Sit, Sit to Supine     Supine to sit: Mod assist Sit to supine: Mod assist        Transfers                   General transfer comment: pt declined      Balance Overall balance assessment: Needs assistance Sitting-balance support: No upper extremity supported, Feet supported Sitting balance-Leahy Scale: Fair                                     ADL either performed or assessed with clinical judgement   ADL Overall ADL's : Needs assistance/impaired Eating/Feeding: Minimal assistance   Grooming: Minimal assistance;Sitting   Upper Body Bathing: Minimal assistance;Sitting   Lower Body Bathing: Maximal assistance   Upper Body Dressing : Minimal assistance;Sitting   Lower Body Dressing: Maximal assistance   Toilet Transfer: Maximal assistance;Stand-pivot;BSC/3in1   Toileting- Clothing Manipulation and Hygiene: Maximal assistance       Functional mobility during ADLs: Moderate assistance;Maximal assistance General ADL Comments: limited to EOB assessment, pt declining further mobility     Vision Baseline Vision/History: 2 Legally blind Ability to See in Adequate Light: 1 Impaired Vision Assessment?: Vision impaired- to be further tested in functional context Additional Comments: baseline  Perception Perception Perception Tested?: No   Praxis Praxis Praxis tested?: Not tested    Pertinent Vitals/Pain Pain Assessment Pain Assessment: Faces Faces Pain  Scale: Hurts a little bit Pain Location: bilateral feet Pain Descriptors / Indicators: Discomfort Pain Intervention(s): Limited activity within patient's tolerance, Monitored during session     Hand Dominance Right   Extremity/Trunk Assessment Upper Extremity Assessment Upper Extremity Assessment: Generalized weakness (ROM WFL)   Lower Extremity Assessment Lower Extremity Assessment: Defer to PT evaluation   Cervical / Trunk Assessment Cervical / Trunk Assessment: Kyphotic   Communication Communication Communication: No difficulties   Cognition Arousal/Alertness: Awake/alert Behavior During Therapy: Flat affect Overall Cognitive Status: No family/caregiver present to determine baseline cognitive functioning Area of Impairment: Orientation, Safety/judgement, Attention, Following commands, Awareness, Problem solving                 Orientation Level: Disoriented to, Situation, Place, Time Current Attention Level: Sustained Memory: Decreased short-term memory Following Commands: Follows one step commands with increased time Safety/Judgement: Decreased awareness of safety, Decreased awareness of deficits Awareness: Emergent Problem Solving: Slow processing, Decreased initiation, Requires verbal cues, Requires tactile cues General Comments: initially pleasant and following most commands. Once sitting EOB, pt became irritable and refused further participation. Disoriented, initally stating he was at his brothers house, and then in a truck.     General Comments  VSS stable    Exercises     Shoulder Instructions      Home Living Family/patient expects to be discharged to:: Private residence Living Arrangements: Parent Available Help at Discharge: Family Type of Home: House Home Access: Stairs to enter Technical brewer of Steps: 4 Entrance Stairs-Rails: Left;Right Home Layout: One level                   Additional Comments: setup per chart. lives with  53yo mother who has aids to assist her      Prior Functioning/Environment Prior Level of Function : Independent/Modified Independent             Mobility Comments: household ambulator ADLs Comments: limited by low vision. assist for IADLs        OT Problem List: Decreased range of motion;Decreased strength;Decreased activity tolerance;Impaired balance (sitting and/or standing);Decreased safety awareness;Decreased knowledge of use of DME or AE;Decreased cognition;Decreased knowledge of precautions      OT Treatment/Interventions: Self-care/ADL training;Therapeutic exercise;DME and/or AE instruction;Therapeutic activities;Patient/family education;Balance training    OT Goals(Current goals can be found in the care plan section) Acute Rehab OT Goals Patient Stated Goal: to go home OT Goal Formulation: With patient Time For Goal Achievement: 08/04/22 Potential to Achieve Goals: Good ADL Goals Pt Will Perform Grooming: with set-up;sitting Pt Will Perform Lower Body Dressing: with min assist;sit to/from stand Pt Will Transfer to Toilet: with min assist;stand pivot transfer;bedside commode Pt Will Perform Toileting - Clothing Manipulation and hygiene: with min assist;sitting/lateral leans Additional ADL Goal #1: Pt will complete bed mobility with min G as a precursor to ADLs  OT Frequency: Min 2X/week    Co-evaluation              AM-PAC OT "6 Clicks" Daily Activity     Outcome Measure Help from another person eating meals?: A Little Help from another person taking care of personal grooming?: A Little Help from another person toileting, which includes using toliet, bedpan, or urinal?: A Lot Help from another person bathing (including washing, rinsing, drying)?: A Lot Help from another person to put on and taking  off regular upper body clothing?: A Little Help from another person to put on and taking off regular lower body clothing?: A Lot 6 Click Score: 15   End of Session  Equipment Utilized During Treatment: Gait belt Nurse Communication: Mobility status  Activity Tolerance: Patient tolerated treatment well Patient left: in bed;with call bell/phone within reach  OT Visit Diagnosis: Unsteadiness on feet (R26.81);Other abnormalities of gait and mobility (R26.89);Muscle weakness (generalized) (M62.81);Pain                Time: 9122-5834 OT Time Calculation (min): 18 min Charges:  OT General Charges $OT Visit: 1 Visit OT Evaluation $OT Eval Moderate Complexity: 1 Mod    Carl Bell 07/21/2022, 5:02 PM

## 2022-07-21 NOTE — ED Provider Notes (Signed)
Labs reveal worsening dehydration.  Patient had significant urine output from Foley catheter.  If remains in place He has chronic anemia on labs. He will be given IV fluids It appears patient has had confusion since his recent hospitalization.  He had a CT head at that time that was negative.  Here he has been talking to himself, but is easily directable.  He is blind at baseline I discussed the case with Dr. Marlowe Sax with [facility] hospitalist. We discussed the possibility of medical admission due to his worsening dehydration.  Patient is not taking any oral fluids.   At this time he does not meet criteria for admission. However if he is unable to have placement found in the next 24 hours, or any worsening dehydration, patient can be reconsidered for admission   Ripley Fraise, MD 07/21/22 [phone]

## 2022-07-21 NOTE — ED Notes (Signed)
Patient refusing breakfast at this time

## 2022-07-21 NOTE — Progress Notes (Signed)
CSW spoke to patients son Remo Lipps, [phone] who stated he cannot care for his father at this time. Remo Lipps said he is having Kidney surgery soon and also goes to dialysis [facility], Saturday. CSW explained that she will send out SNF referrals but if there are no offers then patient will need to discharge home. CSW told Remo Lipps to speak with his family about plan of care.  CSW left a VM with [facility] financial to see if patients will qualify for medicaid.

## 2022-07-21 NOTE — ED Notes (Addendum)
Patient continues to yell out and call for "[name]."  Patient re-oriented and remains calm.  Patient continues to talk and laugh quietly to himself. Patient continues to refuse to eat or drink anything.

## 2022-07-22 MED ORDER — CIPROFLOXACIN HCL 500 MG PO TABS
500.0000 mg | ORAL_TABLET | Freq: Two times a day (BID) | ORAL | Status: AC
  Administered 2022-07-22 – 2022-07-27 (×10): 500 mg via ORAL
  Filled 2022-07-22 (×10): qty 1

## 2022-07-22 NOTE — Progress Notes (Signed)
TOC CSW attempted to contact pts son, [name] and [name]'s wife, [name].  CSW left HIPPA compliant message with my contact information on [name]'s vm.  [name]'s vm is full and can not accept messages at this time.  CSW attempting to inform them of pts bed offers.  [name], MSW, LCSW-A Pronouns:  She/Her/Hers [facility] HealthTransitions of Care Clinical Social Worker Direct Number:  [phone] [name]

## 2022-07-22 NOTE — ED Notes (Signed)
Pt didn't want breakfast. Just cups of ice and water

## 2022-07-22 NOTE — ED Provider Notes (Signed)
Emergency Medicine Observation Re-evaluation Note  Carl Bell is a 72 y.o. male, seen on rounds today.  Pt initially presented to the ED for complaints of Behavior Problems Currently, the patient is sleeping.  Physical Exam  BP 107/81 (BP Location: Right Arm)   Pulse 96   Temp (!) 97.5 F (36.4 C) (Oral)   Resp (!) 21   Ht 6\' 1"  (1.854 m)   Wt 55 kg   SpO2 100%   BMI 16.00 kg/m  Physical Exam General: Sleeping Cardiac: Extremities perfused Lungs: Breathing is unlabored Psych: Deferred  ED Course / MDM  EKG:   I have reviewed the labs performed to date as well as medications administered while in observation.  Recent changes in the last 24 hours include none.  Plan  Current plan is for SNF placement.    Godfrey Pick, MD 07/22/22 [phone]

## 2022-07-22 NOTE — Progress Notes (Addendum)
This CSW attempted to contact the pt's son, [name] to inform of bed offers. Unable to leave voicemail due to mailbox being full. This CSW also attempted to contact [name], the pt's DIL and left HIPAA Compliant voicemail requesting a call back. Will attempt again later. TOC following.   This CSW verified with [name] that the pt can admit to [facility] or [facility] contingent upon [employer] by insurance.   Addend @ 10:24 AM [name] called but call dropped.   Addend @ 11:32 AM This CSW attempted to contact [name] back and left HIPAA Compliant voicemail.  Addend @ 12:41 PM Attempted to contact pt's son [name]. No Answer. VM full.  Addend @ 2:44 PM This CSW has attempted to contact Stacy and DIL, [name] multiple times without success.

## 2022-07-23 LAB — URINE CULTURE: Culture: >=100000 — AB

## 2022-07-23 NOTE — ED Notes (Addendum)
Pt resting in bed. Given water with ice

## 2022-07-23 NOTE — Progress Notes (Signed)
CSW spoke with patients brother Gaspar Bidding, [phone]. Gaspar Bidding stated he would like to accept the bed offer at [facility]. CSW contacted [name] [phone] with central intake to start insurance authorization. CSW left a message requesting a call back.

## 2022-07-23 NOTE — Progress Notes (Signed)
CSW contacted Carl Bell [phone] with central intake and authorization is being started today for [employer].

## 2022-07-23 NOTE — ED Provider Notes (Signed)
Emergency Medicine Observation Re-evaluation Note  Carl Bell is a 72 y.o. male, seen on rounds today.  Pt initially presented to the ED for complaints of Behavior Problems Currently, the patient is resting in bed after just waking up.  Physical Exam  BP 112/80 (BP Location: Right Arm)   Pulse 79   Temp 97.8 F (36.6 C) (Oral)   Resp 16   Ht 6\' 1"  (1.854 m)   Wt 55 kg   SpO2 97%   BMI 16.00 kg/m  Physical Exam General: Resting in bed Cardiac: No murmur initially Lungs: Clear bilaterally Psych: No acute distress or agitation  ED Course / MDM  EKG:   I have reviewed the labs performed to date as well as medications administered while in observation.  Recent changes in the last 24 hours include none reported by nursing.  Plan  Current plan is for awaiting placement.    Araina Butrick, Gwenyth Allegra, MD 07/23/22 [phone]

## 2022-07-23 NOTE — ED Notes (Signed)
Pt given 2 pillows for comfort. One behind lumbar region and one under right arm.

## 2022-07-23 NOTE — Progress Notes (Signed)
Physical Therapy Treatment Patient Details Name: Carl Bell MRN: [id number] DOB: [birth year] Today's Date: 07/23/2022   History of Present Illness Carl Bell is a 72 y.o. male who presents 07/18/2022 with combativeness from SNF. S/p Hartman's procedure for sigmoid volvulus on 06/23/22. Past medical history significant for low vision, COPD with chronic respiratory failure on 2 L of oxygen, hypertension, history of lung cancer status post chemoradiation therapy, history of liver cirrhosis and hypertension.    PT Comments    Pt making gradual progress.  He was pleasant and cooperative but did require encouragement/education to participate and still somewhat self limiting (declined ambulation).  He required min-mod A for transfers and to take side steps at EOB.  Pt in ED with no recliner so returned to bed.  He did have DOE of 3/4 requiring frequent rest breaks on 2 L O2.  Continue to recommend SNF at d/c as pt lives with his 29 yo mother.    Recommendations for follow up therapy are one component of a multi-disciplinary discharge planning process, led by the attending physician.  Recommendations may be updated based on patient status, additional functional criteria and insurance authorization.  Follow Up Recommendations  Skilled nursing-short term rehab (<3 hours/day) Can patient physically be transported by private vehicle: No   Assistance Recommended at Discharge Frequent or constant Supervision/Assistance  Patient can return home with the following Help with stairs or ramp for entrance;A lot of help with walking and/or transfers;A lot of help with bathing/dressing/bathroom   Equipment Recommendations  BSC/3in1    Recommendations for Other Services       Precautions / Restrictions Precautions Precautions: Fall;Other (comment) Precaution Comments: colostomy, blind     Mobility  Bed Mobility Overal bed mobility: Needs Assistance Bed Mobility: Supine to Sit, Sit to Supine      Supine to sit: Min assist, HOB elevated Sit to supine: Min assist   General bed mobility comments: Light Min A to lift trunk and then min A for legs back to bed    Transfers Overall transfer level: Needs assistance Equipment used: Rolling walker (2 wheels) Transfers: Sit to/from Stand Sit to Stand: Min assist           General transfer comment: Performed x 2 with encouragement; cues for hand placement and depends on legs pushing back into bed to stabilize initially    Ambulation/Gait Ambulation/Gait assistance: Mod assist Gait Distance (Feet): 3 Feet Assistive device: Rolling walker (2 wheels) Gait Pattern/deviations: Step-to pattern, Decreased stride length Gait velocity: decreased     General Gait Details: Pt declining ambulation  "i don't need to do that right now.", but was willing to stand and take side steps toward HOB.  Requiring assist for RW and to stabilize.   Stairs             Wheelchair Mobility    Modified Rankin (Stroke Patients Only)       Balance Overall balance assessment: Needs assistance Sitting-balance support: No upper extremity supported, Feet supported Sitting balance-Leahy Scale: Good     Standing balance support: Bilateral upper extremity supported Standing balance-Leahy Scale: Poor Standing balance comment: Requiring RW, initially posterior lean but improved to min A static and mod A dynamic                            Cognition Arousal/Alertness: Awake/alert Behavior During Therapy: WFL for tasks assessed/performed Overall Cognitive Status: No family/caregiver present to determine baseline cognitive functioning  Safety/Judgement: Decreased awareness of safety, Decreased awareness of deficits     General Comments: Pt pleasant and following commands but is somewhat self limiting and unaware of deficits/ability to care for self.  Stating not wanting to go to rehab at d/c but lives  with his 71 yo mother.        Exercises General Exercises - Lower Extremity Ankle Circles/Pumps: AROM, Both, 20 reps, Seated Long Arc Quad: AROM, Both, 20 reps, Seated Hip Flexion/Marching: AROM, Both, 15 reps, Seated    General Comments General comments (skin integrity, edema, etc.): Pt on 2 L O2 .  Unable to get good pulse oximeter reading.  Allowed several rest breaks for DOE      Pertinent Vitals/Pain Pain Assessment Pain Assessment: Faces Faces Pain Scale: Hurts a little bit Pain Location: R shoulder stiff Pain Descriptors / Indicators: Discomfort Pain Intervention(s): Repositioned    Home Living                          Prior Function            PT Goals (current goals can now be found in the care plan section) Progress towards PT goals: Progressing toward goals    Frequency    Min 2X/week      PT Plan Current plan remains appropriate    Co-evaluation              AM-PAC PT "6 Clicks" Mobility   Outcome Measure  Help needed turning from your back to your side while in a flat bed without using bedrails?: A Little Help needed moving from lying on your back to sitting on the side of a flat bed without using bedrails?: A Little Help needed moving to and from a bed to a chair (including a wheelchair)?: A Lot Help needed standing up from a chair using your arms (e.g., wheelchair or bedside chair)?: A Lot Help needed to walk in hospital room?: Total Help needed climbing 3-5 steps with a railing? : Total 6 Click Score: 12    End of Session Equipment Utilized During Treatment: Gait belt Activity Tolerance: Patient tolerated treatment well Patient left: in bed;with call bell/phone within reach (in ED, no alarm) Nurse Communication: Mobility status PT Visit Diagnosis: Unsteadiness on feet (R26.81);Muscle weakness (generalized) (M62.81);Difficulty in walking, not elsewhere classified (R26.2)     Time: 3606-7703 PT Time Calculation (min)  (ACUTE ONLY): 28 min  Charges:  $Therapeutic Exercise: 8-22 mins $Therapeutic Activity: 8-22 mins                     Abran Richard, PT Acute Rehab [facility] [phone]    Karlton Lemon 07/23/2022, 1:23 PM

## 2022-07-24 NOTE — Progress Notes (Signed)
CSW is waiting for insurance auth approval

## 2022-07-24 NOTE — Progress Notes (Signed)
Occupational Therapy Treatment Patient Details Name: Carl Bell MRN: [id number] DOB: 1950/10/20 Today's Date: 07/24/2022   History of present illness Carl Bell is a 72 y.o. male who presents 07/18/2022 with combativeness from SNF. S/p Hartman's procedure for sigmoid volvulus on 06/23/22. Past medical history significant for low vision, COPD with chronic respiratory failure on 2 L of oxygen, hypertension, history of lung cancer status post chemoradiation therapy, history of liver cirrhosis and hypertension.   OT comments  Pt with slower progress towards goals, limited by cognition, low vision and self limiting behaviors at times. Pt pleasant, agreeable for EOB tasks though declined any further mobility attempts today. Focus on self feeding with low vision strategies with fair carryover though pt ultimately reporting "I'm a feeder". Reinforced progression of functional abilities as low vision has been a chronic issue w/ pt reporting baseline independence. Continue to rec SNF rehab at DC unless family able to provide the needed assist.    Recommendations for follow up therapy are one component of a multi-disciplinary discharge planning process, led by the attending physician.  Recommendations may be updated based on patient status, additional functional criteria and insurance authorization.    Follow Up Recommendations  Skilled nursing-short term rehab (<3 hours/day)     Assistance Recommended at Discharge Frequent or constant Supervision/Assistance  Patient can return home with the following  Help with stairs or ramp for entrance;A lot of help with walking and/or transfers;A lot of help with bathing/dressing/bathroom;Assistance with cooking/housework;Direct supervision/assist for medications management;Direct supervision/assist for financial management;Assist for transportation;Assistance with feeding   Equipment Recommendations  Other (comment) (TBD pending progress)    Recommendations  for Other Services      Precautions / Restrictions Precautions Precautions: Fall;Other (comment) Precaution Comments: colostomy, blind, monitor BP (soft) Restrictions Weight Bearing Restrictions: No       Mobility Bed Mobility Overal bed mobility: Needs Assistance Bed Mobility: Supine to Sit, Sit to Supine     Supine to sit: Supervision, HOB elevated Sit to supine: Min assist   General bed mobility comments: assist to get LE back into bed    Transfers                   General transfer comment: declined     Balance Overall balance assessment: Needs assistance Sitting-balance support: No upper extremity supported, Feet supported Sitting balance-Leahy Scale: Good                                     ADL either performed or assessed with clinical judgement   ADL Overall ADL's : Needs assistance/impaired Eating/Feeding: Moderate assistance;Bed level Eating/Feeding Details (indicate cue type and reason): assisted with coffee cup to mouth (initially wanted straw then without; assist to line up cup sip location to mouth though pt reporting "i got it"). inquired about using hands for self feeding cut up sausage though pt reported preference for utensil. cued to reach for utensil to bring to mouth w/ pt declining and reporting "im a feeder, you know?". reinforced purpose of therapy to increase independence with daily tasks Grooming: Minimal assistance;Sitting;Wash/dry face Grooming Details (indicate cue type and reason): placed washcloth in hand and pt able to manage this task without assist. 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 Self limited to EOB tasks, declined standing and mobility efforts this AM    Extremity/Trunk Assessment Upper Extremity Assessment Upper Extremity  Assessment: Generalized weakness   Lower Extremity Assessment Lower Extremity Assessment: Defer to PT evaluation        Vision   Vision Assessment?: Vision impaired- to be further tested in functional context Additional Comments: legally blind, can see some light/shadows   Perception     Praxis      Cognition Arousal/Alertness: Awake/alert Behavior During Therapy: WFL for tasks assessed/performed Overall Cognitive Status: No family/caregiver present to determine baseline cognitive functioning Area of Impairment: Orientation, Safety/judgement, Attention, Following commands, Awareness, Problem solving                 Orientation Level: Disoriented to, Time, Situation Current Attention Level: Sustained Memory: Decreased short-term memory Following Commands: Follows one step commands with increased time Safety/Judgement: Decreased awareness of safety, Decreased awareness of deficits Awareness: Intellectual Problem Solving: Slow processing, Decreased initiation, Requires verbal cues, Requires tactile cues General Comments: Pt pleasant and following commands but is somewhat self limiting and unaware of deficits/ability to care for self. Agreed with recs for rehab to get stronger though pt declines to attempt many things that will help him get stronger; reports he has already been able to do these things (get up to bathroom, etc though this is not accurate for this admission)        Exercises      Shoulder Instructions       General Comments      Pertinent Vitals/ Pain       Pain Assessment Pain Assessment: No/denies pain  Home Living                                          Prior Functioning/Environment              Frequency  Min 2X/week        Progress Toward Goals  OT Goals(current goals can now be found in the care plan section)  Progress towards OT goals: OT to reassess next treatment  Acute Rehab OT Goals Patient Stated  Goal: get stronger to be able to go home OT Goal Formulation: With patient Time For Goal Achievement: 08/04/22 Potential to Achieve Goals: Good ADL Goals Pt Will Perform Grooming: with set-up;sitting Pt Will Perform Lower Body Dressing: with min assist;sit to/from stand Pt Will Transfer to Toilet: with min assist;stand pivot transfer;bedside commode Pt Will Perform Toileting - Clothing Manipulation and hygiene: with min assist;sitting/lateral leans Additional ADL Goal #1: Pt will complete bed mobility with min G as a precursor to ADLs  Plan Discharge plan remains appropriate;Frequency remains appropriate    Co-evaluation                 AM-PAC OT "6 Clicks" Daily Activity     Outcome Measure   Help from another person eating meals?: A Lot Help from another person taking care of personal grooming?: A Little Help from another person toileting, which includes using toliet, bedpan, or urinal?: A Lot Help from another person bathing (including washing, rinsing, drying)?: A Lot Help from another person to put on and taking off regular upper body clothing?: A Little Help from another person to put on and taking off regular lower body  clothing?: A Lot 6 Click Score: 14    End of Session Equipment Utilized During Treatment: Oxygen  OT Visit Diagnosis: Unsteadiness on feet (R26.81);Other abnormalities of gait and mobility (R26.89);Muscle weakness (generalized) (M62.81)   Activity Tolerance Other (comment) (self limiting)   Patient Left in bed;with call bell/phone within reach   Nurse Communication Mobility status;Other (comment) (assist with meals)        Time: 1947-1252 OT Time Calculation (min): 18 min  Charges: OT General Charges $OT Visit: 1 Visit OT Treatments $Self Care/Home Management : 8-22 mins  Malachy Chamber, OTR/L Acute Rehab Services Office: [phone]   Layla Maw 07/24/2022, 8:02 AM

## 2022-07-24 NOTE — ED Provider Notes (Signed)
Emergency Medicine Observation Re-evaluation Note  Carl Bell is a 72 y.o. male, seen on rounds today.  Pt initially presented to the ED for complaints of Behavior Problems Currently, the patient is awake and alert.  His urine came back + yesterday for pseudomonas and cipro was started.  Culture showed sensitivity.  Physical Exam  BP 112/71   Pulse 93   Temp 97.8 F (36.6 C) (Oral)   Resp 15   Ht 6\' 1"  (1.854 m)   Wt 55 kg   SpO2 99%   BMI 16.00 kg/m  Physical Exam General: awake and alert Cardiac: rr Lungs: clear, on chronic 2L Psych: calm  ED Course / MDM  EKG:   I have reviewed the labs performed to date as well as medications administered while in observation.  Recent changes in the last 24 hours include cipro started yesterday.  Plan  Current plan is for awaiting placement.    Isla Pence, MD 07/24/22 [phone]

## 2022-07-24 NOTE — ED Notes (Signed)
Writer rounded on patient and refilled water cup, patient denies any further needs at this time.

## 2022-07-25 NOTE — Progress Notes (Signed)
Whitney with centralized intake for [facility] stated insurance Berkley Harvey was still pending.

## 2022-07-25 NOTE — Progress Notes (Signed)
CSW contacted Whitney with centralized intake for [facility] to follow up on insurance authorization. CSW left a message requesting call back.

## 2022-07-25 NOTE — ED Provider Notes (Signed)
Emergency Medicine Observation Re-evaluation Note  Carl Bell is a 72 y.o. male, seen on rounds today.  Pt initially presented to the ED for complaints of Behavior Problems Currently, the patient is sleeping.  Physical Exam  BP 105/73 (BP Location: Right Arm)   Pulse 100   Temp 98 F (36.7 C)   Resp 16   Ht 6\' 1"  (1.854 m)   Wt 55 kg   SpO2 100%   BMI 16.00 kg/m  Physical Exam General: sleeping Cardiac: regular Lungs: no distress Psych: sleeping but has been cooperative  ED Course / MDM  EKG:   I have reviewed the labs performed to date as well as medications administered while in observation.  Recent changes in the last 24 hours include none.  Plan  Current plan is for SNF placement.    Blanchie Dessert, MD 07/25/22 [phone]

## 2022-07-26 MED ORDER — BOOST / RESOURCE BREEZE PO LIQD CUSTOM
1.0000 | Freq: Three times a day (TID) | ORAL | Status: DC
  Administered 2022-07-26 – 2022-07-30 (×10): 1 via ORAL
  Filled 2022-07-26 (×6): qty 1

## 2022-07-26 NOTE — ED Notes (Signed)
Pt reports he likes strawberry boosts and will drink those. Notified EDP to see if we could get some ordered.

## 2022-07-26 NOTE — ED Notes (Signed)
Replaced mepilex dressing on pt's bottom. Offered mouthswab and pt refused. Assisted pt in putting on deodorant. Ostomy bag burped.

## 2022-07-26 NOTE — ED Provider Notes (Signed)
Emergency Medicine Observation Re-evaluation Note  Carl Bell is a 72 y.o. male, seen on rounds today.  Pt initially presented to the ED for complaints of Behavior Problems Currently, the patient is requesting strawberry boost to drink.  Physical Exam  BP 113/86 (BP Location: Right Arm)   Pulse (!) 105   Temp 97.6 F (36.4 C)   Resp 15   Ht 6\' 1"  (1.854 m)   Wt 55 kg   SpO2 94%   BMI 16.00 kg/m  Physical Exam General: resting comfortably, NAD Lungs: normal WOB Psych: currently calm and resting  ED Course / MDM  EKG:   I have reviewed the labs performed to date as well as medications administered while in observation.  Recent changes in the last 24 hours include none.  Plan  Current plan is for placement.    Lorelle Gibbs, DO 07/26/22 1222

## 2022-07-27 NOTE — ED Notes (Signed)
Repositioned pt to left side using pillows and wedge. Pts brief is clean and dry.

## 2022-07-27 NOTE — ED Notes (Signed)
Pt repositioned to supine position. Denies being in pain.

## 2022-07-27 NOTE — ED Notes (Signed)
Messaged pharmacy regarding sending the boost

## 2022-07-27 NOTE — ED Provider Notes (Signed)
Emergency Medicine Observation Re-evaluation Note  Carl Bell is a 72 y.o. male, seen on rounds today.  Pt initially presented to the ED for complaints of Behavior Problems Currently, the patient is sitting in room.  Physical Exam  BP 103/75 (BP Location: Right Arm)   Pulse 100   Temp 98.8 F (37.1 C) (Oral)   Resp 16   Ht 6\' 1"  (1.854 m)   Wt 55 kg   SpO2 94%   BMI 16.00 kg/m  Physical Exam General: resting comfortably, NAD Lungs: normal WOB Psych: currently calm and resting  ED Course / MDM  EKG:   I have reviewed the labs performed to date as well as medications administered while in observation.  Recent changes in the last 24 hours include none.  Plan  Current plan is for placement, TOC is following.    Lorelle Gibbs, DO 07/27/22 1843

## 2022-07-27 NOTE — ED Notes (Signed)
Pt refused to eat his meal tray and said he wasn't hungry. This RN mixed pt's boost w/ milk and ice to make a "milkshake", pt likes strawberry boosts. Pt drank the entire boost and and milk.

## 2022-07-27 NOTE — Progress Notes (Signed)
CSW spoke with Alphonzo Lemmings at [facility] who states the patient's insurance authorization is still pending at this time.  Edwin Dada, MSW, LCSW Transitions of Care  Clinical Social Worker II [phone]

## 2022-07-27 NOTE — ED Notes (Signed)
Updates/Progress Note Patient calm and cooperative today. Pt still having visual and auditory hallucinations and responding to internal stimuli intermittently. Mixed pt's berry boost w/ milk and ice and told him it was a strawberry milkshake that I made especially for him and he drank all of it.  Orientation - A&Ox1 (self)  ADLs Feeding - Needs to be fed. Give boost at meal times and mix w/ milk and ice to make a "milkshake".  Bathed - Yes  Takes Medications Whole   PRN Medications Given NA  Behaviors NA

## 2022-07-27 NOTE — ED Notes (Signed)
Pt repositioned to Right side.

## 2022-07-28 NOTE — ED Provider Notes (Addendum)
Emergency Medicine Observation Re-evaluation Note  Carl Bell is a 72 y.o. male, seen on rounds today.  Pt initially presented to the ED for complaints of Behavior Problems Currently, the patient is resting.Marland Kitchen  Physical Exam  BP 96/77   Pulse 90   Temp 98.2 F (36.8 C)   Resp 18   Ht 6\' 1"  (1.854 m)   Wt 55 kg   SpO2 95%   BMI 16.00 kg/m  Physical Exam General: NAD Lungs: normal WOB Psych: No agitation  ED Course / MDM  EKG:   I have reviewed the labs performed to date as well as medications administered while in observation.  Recent changes in the last 24 hours include none.  Plan  Current plan is for placement, TOC is following.          Regan Lemming, MD 07/28/22 [phone]

## 2022-07-28 NOTE — ED Notes (Signed)
Pt is refusing medications at this time. Pt requested to take medication later on in the day. Pt was able to drink his boost beverage through a straw.

## 2022-07-28 NOTE — Progress Notes (Signed)
TOC CSW received a return call from [place].  Monia Pouch is closed today, and Berkley Harvey was still pending per [employer].  Whitney stated she would keep TOC updated.  Zenda Herskowitz Tarpley-Carter, MSW, LCSW-A Pronouns:  She/Her/Hers [facility] Care Clinical Social Worker Direct Number:  [phone] [email or web address]

## 2022-07-28 NOTE — Progress Notes (Signed)
TOC CSW attempted to contact [facility] @ [facility] [phone].  CSW left HIPPA compliant message with my contact information.  Avyan Livesay Tarpley-Carter, MSW, LCSW-A Pronouns:  She/Her/Hers [facility] Clinical Social Worker Direct Number:  [phone] [email or web address]

## 2022-07-29 NOTE — Progress Notes (Signed)
Physical Therapy Treatment Patient Details Name: Carl Bell MRN: [id number] DOB: 03-Dec-1950 Today's Date: 07/29/2022   History of Present Illness Carl Bell is a 72 y.o. male who presents 07/18/2022 with combativeness from SNF. S/p Hartman's procedure for sigmoid volvulus on 06/23/22. Past medical history significant for low vision, COPD with chronic respiratory failure on 2 L of oxygen, hypertension, history of lung cancer status post chemoradiation therapy, history of liver cirrhosis and hypertension.    PT Comments    Patient sleepy upon arrival but agreeable to work with therapy. Not progressing with mobility this session as pt self limiting/cognition? Agrees verbally to participate but then when asked to perform task, pt declines. Able to get halfway sitting EOB today with Mod A but declined sitting upright or OOB mobility.  Tolerated there ex of LEs/UEs and then asking to go to sleep. Noted to have swelling in his scrotum and generalized pain in LEs with movement as well as ribs and right shoulder per report. Continues to be appropriate for SNF. Will follow.   Recommendations for follow up therapy are one component of a multi-disciplinary discharge planning process, led by the attending physician.  Recommendations may be updated based on patient status, additional functional criteria and insurance authorization.  Follow Up Recommendations  Skilled nursing-short term rehab (<3 hours/day) Can patient physically be transported by private vehicle: No   Assistance Recommended at Discharge Frequent or constant Supervision/Assistance  Patient can return home with the following Help with stairs or ramp for entrance;A lot of help with walking and/or transfers;A lot of help with bathing/dressing/bathroom   Equipment Recommendations  BSC/3in1    Recommendations for Other Services       Precautions / Restrictions Precautions Precautions: Fall;Other (comment) Precaution Comments:  colostomy, blind, monitor BP (soft) Restrictions Weight Bearing Restrictions: No     Mobility  Bed Mobility Overal bed mobility: Needs Assistance Bed Mobility: Rolling, Sidelying to Sit Rolling: Min assist Sidelying to sit: Mod assist (only able to assist with moving LEs off and on bed)       General bed mobility comments: Assist to bring LEs to EOB in an attempt to sit EOB however once legs off bed and attempting to assist with trunk, pt says no and refuses to sit upright. Assist to bring LES back into bed, placed pillow between knees to prevent skin breakdown.    Transfers                   General transfer comment: declined    Ambulation/Gait                   Stairs             Wheelchair Mobility    Modified Rankin (Stroke Patients Only)       Balance                                            Cognition Arousal/Alertness: Awake/alert Behavior During Therapy: Flat affect Overall Cognitive Status: No family/caregiver present to determine baseline cognitive functioning Area of Impairment: Orientation, Attention, Memory, Following commands, Safety/judgement, Awareness, Problem solving                 Orientation Level: Disoriented to, Place, Situation Current Attention Level: Sustained Memory: Decreased short-term memory Following Commands: Follows one step commands with increased time Safety/Judgement: Decreased awareness of  safety, Decreased awareness of deficits Awareness: Intellectual Problem Solving: Slow processing, Decreased initiation, Requires verbal cues, Requires tactile cues General Comments: Following commands well with increased time, somewhat self limiting. Verbally agreeing to perform task but then declines once you get there.        Exercises General Exercises - Lower Extremity Ankle Circles/Pumps: AROM, Both, 10 reps, Sidelying Long Arc Quad: Both, 10 reps, AROM, Sidelying, Seated Heel Slides:  AAROM, Both, 5 reps, Supine Hip ABduction/ADduction: AAROM, Both, 5 reps, Supine Hip Flexion/Marching: AAROM, Both, 10 reps, Supine    General Comments General comments (skin integrity, edema, etc.): Pt on 2L 02. Reports feeling tired and wanting to sleep.      Pertinent Vitals/Pain Pain Assessment Pain Assessment: Faces Faces Pain Scale: Hurts even more Pain Location: R shoulder stiff, ribs, LEs with movement Pain Descriptors / Indicators: Discomfort, Guarding, Grimacing Pain Intervention(s): Monitored during session, Repositioned, Limited activity within patient's tolerance    Home Living                          Prior Function            PT Goals (current goals can now be found in the care plan section) Progress towards PT goals: Not progressing toward goals - comment (due to cognition, self limiting)    Frequency    Min 2X/week      PT Plan Current plan remains appropriate    Co-evaluation              AM-PAC PT "6 Clicks" Mobility   Outcome Measure  Help needed turning from your back to your side while in a flat bed without using bedrails?: A Little Help needed moving from lying on your back to sitting on the side of a flat bed without using bedrails?: A Little Help needed moving to and from a bed to a chair (including a wheelchair)?: A Lot Help needed standing up from a chair using your arms (e.g., wheelchair or bedside chair)?: A Lot Help needed to walk in hospital room?: Total Help needed climbing 3-5 steps with a railing? : Total 6 Click Score: 12    End of Session   Activity Tolerance: Patient limited by fatigue;Patient limited by pain;Patient limited by lethargy Patient left: in bed;with call bell/phone within reach;with bed alarm set Nurse Communication: Mobility status PT Visit Diagnosis: Unsteadiness on feet (R26.81);Muscle weakness (generalized) (M62.81);Difficulty in walking, not elsewhere classified (R26.2)     Time:  8919-3560 PT Time Calculation (min) (ACUTE ONLY): 15 min  Charges:  $Therapeutic Exercise: 8-22 mins                     Vale Haven, PT, DPT Acute Rehabilitation Services Secure chat preferred Office [phone]      Blake Divine A Lanier Ensign 07/29/2022, 3:37 PM

## 2022-07-29 NOTE — ED Notes (Signed)
Colostomy bag changed, patient clean and linens changed.

## 2022-07-29 NOTE — ED Notes (Signed)
PT at bedside.

## 2022-07-29 NOTE — ED Notes (Signed)
Patient pulled colostomy bag off. New one order. Will continue to monitor

## 2022-07-29 NOTE — Progress Notes (Signed)
Patients [name] contacted CSW to find out about placement.  CSW is still waiting on patients insurance company to approve SNF placement.  Patients brother thanked CSW for an update.

## 2022-07-29 NOTE — ED Notes (Signed)
Pt was leaning his head on the side of the hospital bed, pt was readjusted and lifted in bed with assistance of Nts/Sitters.

## 2022-07-29 NOTE — ED Provider Notes (Signed)
Emergency Medicine Observation Re-evaluation Note  Carl Bell is a 72 y.o. male, seen on rounds today.  Pt initially presented to the ED for complaints of Behavior Problems Currently, the patient is sitting upright, awake, alert, withdrawn.  Physical Exam  BP 102/70   Pulse 67   Temp 98 F (36.7 C) (Oral)   Resp 18   Ht 6\' 1"  (1.854 m)   Wt 55 kg   SpO2 95%   BMI 16.00 kg/m  Physical Exam General: No distress, frail-appearing elderly male Cardiac: Regular rate and rhythm Lungs: No increased work of breathing Psych: Calm, withdrawn  ED Course / MDM  EKG:   I have reviewed the labs performed to date as well as medications administered while in observation.  Recent changes in the last 24 hours include removal of his colostomy bag with replacement, per nursing.  Plan  Current plan is for total care facility.    Carmin Muskrat, MD 07/29/22 [phone]

## 2022-07-30 NOTE — Progress Notes (Signed)
CSW received a call back Whitney who stated patient is going to [facility] instead of [facility]. Whitney stated that they will have to take him back.

## 2022-07-30 NOTE — ED Notes (Signed)
Patient boosted and repositioned in the bed at this time

## 2022-07-30 NOTE — ED Provider Notes (Addendum)
Emergency Medicine Observation Re-evaluation Note  Carl Bell is a 72 y.o. male, seen on rounds today.  Pt initially presented to the ED for complaints of Behavior Problems Currently, the patient is resting.  Physical Exam  BP 105/77 (BP Location: Right Arm)   Pulse 91   Temp 98 F (36.7 C) (Oral)   Resp 17   Ht 6\' 1"  (1.854 m)   Wt 55 kg   SpO2 100%   BMI 16.00 kg/m  Physical Exam General: No distress, frail-appearing elderly male Cardiac: Well perfused Lungs: No increased work of breathing Psych: Calm, withdrawn  ED Course / MDM  EKG:   I have reviewed the labs performed to date as well as medications administered while in observation.  Recent changes in the last 24 hours include colostomy bag changed yesterday by nursing.  Plan  Current plan is for total care facility.    Pt will be discharged to [facility] today.    Regan Lemming, MD 07/30/22 Azzie Almas    Regan Lemming, MD 07/30/22 1128

## 2022-07-30 NOTE — ED Notes (Signed)
Patient son at bedside asking for SW. Carollee Herter notified

## 2022-07-30 NOTE — Progress Notes (Signed)
CSW contacted Whitney with [employer] and patients insurance authorization has been approved. Patient can discharge today.

## 2022-07-30 NOTE — Progress Notes (Signed)
Occupational Therapy Treatment Patient Details Name: LEONTAE BOSTOCK MRN: [id number] DOB: [birth year] Today's Date: 07/30/2022   History of present illness OSA CAMPOLI is a 72 y.o. male who presents 07/18/2022 with combativeness from SNF. S/p Hartman's procedure for sigmoid volvulus on 06/23/22. Past medical history significant for low vision, COPD with chronic respiratory failure on 2 L of oxygen, hypertension, history of lung cancer status post chemoradiation therapy, history of liver cirrhosis and hypertension.   OT comments  Pt with no progress towards OT goals due to impaired cognition and self limiting behaviors. Attempted to engage in self feeding breakfast though required Max A for juice and declined further PO intake. Noted prior 2 meal trays in room and mostly untouched. Pt declined EOB/OOB activities despite encouragement. Administered Short Blessed Test with score of 21 (0-4 normal cognition) with significant impairments noted in short term memory, recall, orientation and working memory. Due to poor participation, decreased OT frequency to 1x/wk and reached out to ED provider for palliative consult to determine GOC.   Recommendations for follow up therapy are one component of a multi-disciplinary discharge planning process, led by the attending physician.  Recommendations may be updated based on patient status, additional functional criteria and insurance authorization.    Follow Up Recommendations  Skilled nursing-short term rehab (<3 hours/day)     Assistance Recommended at Discharge Frequent or constant Supervision/Assistance  Patient can return home with the following  Help with stairs or ramp for entrance;A lot of help with walking and/or transfers;A lot of help with bathing/dressing/bathroom;Assistance with cooking/housework;Direct supervision/assist for medications management;Direct supervision/assist for financial management;Assist for transportation;Assistance with feeding    Equipment Recommendations  Wheelchair cushion (measurements OT);Wheelchair (measurements OT);Hospital bed    Recommendations for Other Services Other (comment) (Palliative consult)    Precautions / Restrictions Precautions Precautions: Fall;Other (comment) Precaution Comments: colostomy, blind, monitor BP (soft) Restrictions Weight Bearing Restrictions: No       Mobility Bed Mobility               General bed mobility comments: agreeable but when time to initiate movement, declined and pulled covers up    Transfers                   General transfer comment: declined     Balance                                           ADL either performed or assessed with clinical judgement   ADL Overall ADL's : Needs assistance/impaired Eating/Feeding: Bed level;Maximal assistance Eating/Feeding Details (indicate cue type and reason): assist to bring juice to mouth and straw into mouth with pt reaching to assist holding cup though declined any further assist with breakfast. noted prior 2 meal trays in room barely touched`                                        Extremity/Trunk Assessment Upper Extremity Assessment Upper Extremity Assessment: Generalized weakness   Lower Extremity Assessment Lower Extremity Assessment: Defer to PT evaluation        Vision   Vision Assessment?: Vision impaired- to be further tested in functional context Additional Comments: legally blind, can see lights/shadows   Perception     Praxis      Cognition  Arousal/Alertness: Awake/alert Behavior During Therapy: Flat affect Overall Cognitive Status: No family/caregiver present to determine baseline cognitive functioning Area of Impairment: Orientation, Attention, Memory, Following commands, Safety/judgement, Awareness, Problem solving                 Orientation Level: Disoriented to, Place, Situation, Time Current Attention Level:  Sustained Memory: Decreased short-term memory Following Commands: Follows one step commands inconsistently, Follows one step commands with increased time Safety/Judgement: Decreased awareness of safety, Decreased awareness of deficits Awareness: Intellectual Problem Solving: Slow processing, Decreased initiation, Requires verbal cues, Requires tactile cues General Comments: significant memory deficits, unable to recall time of day after OT reoriented pt. Pt unaware of being in hospital, reports vision back to normal (though still unable to identify 3 objects in room when cued - reported lights, twilight zone and thriller movie). administered short blessed test with score of 21 indicating significant cognitive impairment        Exercises      Shoulder Instructions       General Comments      Pertinent Vitals/ Pain       Pain Assessment Pain Assessment: No/denies pain  Home Living                                          Prior Functioning/Environment              Frequency  Min 1X/week        Progress Toward Goals  OT Goals(current goals can now be found in the care plan section)  Progress towards OT goals: Not progressing toward goals - comment  Acute Rehab OT Goals Patient Stated Goal: watch twilight zone OT Goal Formulation: With patient Time For Goal Achievement: 08/04/22 Potential to Achieve Goals: Good ADL Goals Pt Will Perform Grooming: with set-up;sitting Pt Will Perform Lower Body Dressing: with min assist;sit to/from stand Pt Will Transfer to Toilet: with min assist;stand pivot transfer;bedside commode Pt Will Perform Toileting - Clothing Manipulation and hygiene: with min assist;sitting/lateral leans Additional ADL Goal #1: Pt will complete bed mobility with min G as a precursor to ADLs  Plan Discharge plan remains appropriate;Frequency needs to be updated    Co-evaluation                 AM-PAC OT "6 Clicks" Daily Activity      Outcome Measure   Help from another person eating meals?: A Lot Help from another person taking care of personal grooming?: A Little Help from another person toileting, which includes using toliet, bedpan, or urinal?: A Lot Help from another person bathing (including washing, rinsing, drying)?: A Lot Help from another person to put on and taking off regular upper body clothing?: A Little Help from another person to put on and taking off regular lower body clothing?: A Lot 6 Click Score: 14    End of Session Equipment Utilized During Treatment: Oxygen  OT Visit Diagnosis: Unsteadiness on feet (R26.81);Other abnormalities of gait and mobility (R26.89);Muscle weakness (generalized) (M62.81)   Activity Tolerance Other (comment) (self limiting; limited by cognition)   Patient Left in bed;with call bell/phone within reach   Nurse Communication Mobility status        Time: 8768-1157 OT Time Calculation (min): 16 min  Charges: OT General Charges $OT Visit: 1 Visit OT Treatments $Self Care/Home Management : 8-22 mins  Bradd Canary, OTR/L Acute Rehab  Services Office: [phone]   Lorre Munroe 07/30/2022, 8:11 AM

## 2022-07-30 NOTE — ED Notes (Signed)
Patient refusing to be repositioned. Writer assessed colostomy bag, no drainage noted. Pt denies pain at this time.

## 2022-08-03 ENCOUNTER — Encounter (HOSPITAL_COMMUNITY): Payer: Self-pay

## 2022-08-03 ENCOUNTER — Inpatient Hospital Stay (HOSPITAL_COMMUNITY)
Admission: EM | Admit: 2022-08-03 | Discharge: 2022-08-14 | DRG: 871 | Disposition: E | Payer: Medicare HMO | Attending: Internal Medicine | Admitting: Internal Medicine

## 2022-08-03 ENCOUNTER — Emergency Department (HOSPITAL_COMMUNITY): Payer: Medicare HMO

## 2022-08-03 ENCOUNTER — Other Ambulatory Visit: Payer: Self-pay

## 2022-08-03 DIAGNOSIS — R6520 Severe sepsis without septic shock: Secondary | ICD-10-CM | POA: Diagnosis not present

## 2022-08-03 DIAGNOSIS — N179 Acute kidney failure, unspecified: Secondary | ICD-10-CM | POA: Diagnosis present

## 2022-08-03 DIAGNOSIS — A4152 Sepsis due to Pseudomonas: Principal | ICD-10-CM | POA: Diagnosis present

## 2022-08-03 DIAGNOSIS — L89316 Pressure-induced deep tissue damage of right buttock: Secondary | ICD-10-CM | POA: Diagnosis present

## 2022-08-03 DIAGNOSIS — D62 Acute posthemorrhagic anemia: Secondary | ICD-10-CM | POA: Diagnosis present

## 2022-08-03 DIAGNOSIS — R64 Cachexia: Secondary | ICD-10-CM | POA: Diagnosis present

## 2022-08-03 DIAGNOSIS — Z681 Body mass index (BMI) 19 or less, adult: Secondary | ICD-10-CM

## 2022-08-03 DIAGNOSIS — R188 Other ascites: Secondary | ICD-10-CM | POA: Diagnosis present

## 2022-08-03 DIAGNOSIS — D649 Anemia, unspecified: Secondary | ICD-10-CM | POA: Diagnosis present

## 2022-08-03 DIAGNOSIS — D638 Anemia in other chronic diseases classified elsewhere: Secondary | ICD-10-CM | POA: Diagnosis present

## 2022-08-03 DIAGNOSIS — T68XXXA Hypothermia, initial encounter: Secondary | ICD-10-CM | POA: Insufficient documentation

## 2022-08-03 DIAGNOSIS — Z66 Do not resuscitate: Secondary | ICD-10-CM | POA: Diagnosis not present

## 2022-08-03 DIAGNOSIS — I2109 ST elevation (STEMI) myocardial infarction involving other coronary artery of anterior wall: Secondary | ICD-10-CM | POA: Diagnosis not present

## 2022-08-03 DIAGNOSIS — I739 Peripheral vascular disease, unspecified: Secondary | ICD-10-CM | POA: Diagnosis present

## 2022-08-03 DIAGNOSIS — D6959 Other secondary thrombocytopenia: Secondary | ICD-10-CM | POA: Diagnosis not present

## 2022-08-03 DIAGNOSIS — N136 Pyonephrosis: Secondary | ICD-10-CM | POA: Diagnosis present

## 2022-08-03 DIAGNOSIS — Z933 Colostomy status: Secondary | ICD-10-CM | POA: Diagnosis not present

## 2022-08-03 DIAGNOSIS — J9621 Acute and chronic respiratory failure with hypoxia: Secondary | ICD-10-CM | POA: Diagnosis not present

## 2022-08-03 DIAGNOSIS — J449 Chronic obstructive pulmonary disease, unspecified: Secondary | ICD-10-CM | POA: Diagnosis present

## 2022-08-03 DIAGNOSIS — Z85118 Personal history of other malignant neoplasm of bronchus and lung: Secondary | ICD-10-CM

## 2022-08-03 DIAGNOSIS — R9431 Abnormal electrocardiogram [ECG] [EKG]: Secondary | ICD-10-CM | POA: Insufficient documentation

## 2022-08-03 DIAGNOSIS — Z7189 Other specified counseling: Secondary | ICD-10-CM

## 2022-08-03 DIAGNOSIS — R627 Adult failure to thrive: Secondary | ICD-10-CM | POA: Diagnosis present

## 2022-08-03 DIAGNOSIS — J9611 Chronic respiratory failure with hypoxia: Secondary | ICD-10-CM | POA: Insufficient documentation

## 2022-08-03 DIAGNOSIS — L89222 Pressure ulcer of left hip, stage 2: Secondary | ICD-10-CM | POA: Diagnosis present

## 2022-08-03 DIAGNOSIS — A419 Sepsis, unspecified organism: Secondary | ICD-10-CM | POA: Diagnosis not present

## 2022-08-03 DIAGNOSIS — J439 Emphysema, unspecified: Secondary | ICD-10-CM | POA: Diagnosis present

## 2022-08-03 DIAGNOSIS — Z515 Encounter for palliative care: Secondary | ICD-10-CM | POA: Diagnosis not present

## 2022-08-03 DIAGNOSIS — J9622 Acute and chronic respiratory failure with hypercapnia: Secondary | ICD-10-CM | POA: Diagnosis not present

## 2022-08-03 DIAGNOSIS — R739 Hyperglycemia, unspecified: Secondary | ICD-10-CM | POA: Diagnosis not present

## 2022-08-03 DIAGNOSIS — L8990 Pressure ulcer of unspecified site, unspecified stage: Secondary | ICD-10-CM | POA: Insufficient documentation

## 2022-08-03 DIAGNOSIS — N139 Obstructive and reflux uropathy, unspecified: Secondary | ICD-10-CM | POA: Diagnosis not present

## 2022-08-03 DIAGNOSIS — E162 Hypoglycemia, unspecified: Secondary | ICD-10-CM | POA: Diagnosis present

## 2022-08-03 DIAGNOSIS — Z79899 Other long term (current) drug therapy: Secondary | ICD-10-CM

## 2022-08-03 DIAGNOSIS — Z923 Personal history of irradiation: Secondary | ICD-10-CM

## 2022-08-03 DIAGNOSIS — I10 Essential (primary) hypertension: Secondary | ICD-10-CM | POA: Diagnosis present

## 2022-08-03 DIAGNOSIS — G9341 Metabolic encephalopathy: Secondary | ICD-10-CM | POA: Diagnosis present

## 2022-08-03 DIAGNOSIS — E2740 Unspecified adrenocortical insufficiency: Secondary | ICD-10-CM | POA: Diagnosis present

## 2022-08-03 DIAGNOSIS — E875 Hyperkalemia: Secondary | ICD-10-CM | POA: Diagnosis not present

## 2022-08-03 DIAGNOSIS — R6521 Severe sepsis with septic shock: Secondary | ICD-10-CM | POA: Diagnosis present

## 2022-08-03 DIAGNOSIS — H548 Legal blindness, as defined in USA: Secondary | ICD-10-CM | POA: Diagnosis not present

## 2022-08-03 DIAGNOSIS — E8809 Other disorders of plasma-protein metabolism, not elsewhere classified: Secondary | ICD-10-CM | POA: Diagnosis present

## 2022-08-03 DIAGNOSIS — E43 Unspecified severe protein-calorie malnutrition: Secondary | ICD-10-CM | POA: Diagnosis present

## 2022-08-03 DIAGNOSIS — E861 Hypovolemia: Secondary | ICD-10-CM | POA: Diagnosis present

## 2022-08-03 DIAGNOSIS — Z9981 Dependence on supplemental oxygen: Secondary | ICD-10-CM

## 2022-08-03 DIAGNOSIS — K7031 Alcoholic cirrhosis of liver with ascites: Secondary | ICD-10-CM | POA: Diagnosis not present

## 2022-08-03 DIAGNOSIS — I2489 Other forms of acute ischemic heart disease: Secondary | ICD-10-CM | POA: Diagnosis not present

## 2022-08-03 DIAGNOSIS — L89626 Pressure-induced deep tissue damage of left heel: Secondary | ICD-10-CM | POA: Diagnosis present

## 2022-08-03 DIAGNOSIS — K7460 Unspecified cirrhosis of liver: Secondary | ICD-10-CM | POA: Diagnosis present

## 2022-08-03 DIAGNOSIS — Z8249 Family history of ischemic heart disease and other diseases of the circulatory system: Secondary | ICD-10-CM

## 2022-08-03 DIAGNOSIS — F17210 Nicotine dependence, cigarettes, uncomplicated: Secondary | ICD-10-CM | POA: Diagnosis present

## 2022-08-03 DIAGNOSIS — L89152 Pressure ulcer of sacral region, stage 2: Secondary | ICD-10-CM | POA: Diagnosis present

## 2022-08-03 DIAGNOSIS — R4182 Altered mental status, unspecified: Secondary | ICD-10-CM | POA: Diagnosis not present

## 2022-08-03 DIAGNOSIS — R579 Shock, unspecified: Secondary | ICD-10-CM | POA: Diagnosis not present

## 2022-08-03 DIAGNOSIS — Z9221 Personal history of antineoplastic chemotherapy: Secondary | ICD-10-CM

## 2022-08-03 DIAGNOSIS — L89326 Pressure-induced deep tissue damage of left buttock: Secondary | ICD-10-CM | POA: Diagnosis present

## 2022-08-03 DIAGNOSIS — R54 Age-related physical debility: Secondary | ICD-10-CM | POA: Diagnosis present

## 2022-08-03 LAB — CBC WITH DIFFERENTIAL/PLATELET
Abs Immature Granulocytes: 0.02 10*3/uL (ref 0.00–0.07)
Basophils Absolute: 0 10*3/uL (ref 0.0–0.1)
Basophils Relative: 0 %
Eosinophils Absolute: 0 10*3/uL (ref 0.0–0.5)
Eosinophils Relative: 0 %
HCT: 20.4 % — ABNORMAL LOW (ref 39.0–52.0)
Hemoglobin: 6.5 g/dL — CL (ref 13.0–17.0)
Immature Granulocytes: 1 %
Lymphocytes Relative: 7 %
Lymphs Abs: 0.3 10*3/uL — ABNORMAL LOW (ref 0.7–4.0)
MCH: 24.1 pg — ABNORMAL LOW (ref 26.0–34.0)
MCHC: 31.9 g/dL (ref 30.0–36.0)
MCV: 75.6 fL — ABNORMAL LOW (ref 80.0–100.0)
Monocytes Absolute: 0.3 10*3/uL (ref 0.1–1.0)
Monocytes Relative: 8 %
Neutro Abs: 3.2 10*3/uL (ref 1.7–7.7)
Neutrophils Relative %: 84 %
Platelets: 170 10*3/uL (ref 150–400)
RBC: 2.7 MIL/uL — ABNORMAL LOW (ref 4.22–5.81)
RDW: 16.8 % — ABNORMAL HIGH (ref 11.5–15.5)
WBC: 3.8 10*3/uL — ABNORMAL LOW (ref 4.0–10.5)
nRBC: 1.3 % — ABNORMAL HIGH (ref 0.0–0.2)

## 2022-08-03 LAB — BASIC METABOLIC PANEL
Anion gap: 16 — ABNORMAL HIGH (ref 5–15)
BUN: 55 mg/dL — ABNORMAL HIGH (ref 8–23)
CO2: 18 mmol/L — ABNORMAL LOW (ref 22–32)
Calcium: 7.4 mg/dL — ABNORMAL LOW (ref 8.9–10.3)
Chloride: 108 mmol/L (ref 98–111)
Creatinine, Ser: 5.88 mg/dL — ABNORMAL HIGH (ref 0.61–1.24)
GFR, Estimated: 9 mL/min — ABNORMAL LOW (ref >60)
Glucose, Bld: 108 mg/dL — ABNORMAL HIGH (ref 70–99)
Potassium: 4.6 mmol/L (ref 3.5–5.1)
Sodium: 142 mmol/L (ref 135–145)

## 2022-08-03 LAB — COMPREHENSIVE METABOLIC PANEL
ALT: 12 U/L (ref 0–44)
AST: 26 U/L (ref 15–41)
Albumin: 1.5 g/dL — ABNORMAL LOW (ref 3.5–5.0)
Alkaline Phosphatase: 45 U/L (ref 38–126)
Anion gap: 24 — ABNORMAL HIGH (ref 5–15)
BUN: 63 mg/dL — ABNORMAL HIGH (ref 8–23)
CO2: 14 mmol/L — ABNORMAL LOW (ref 22–32)
Calcium: 7.5 mg/dL — ABNORMAL LOW (ref 8.9–10.3)
Chloride: 107 mmol/L (ref 98–111)
Creatinine, Ser: 6.93 mg/dL — ABNORMAL HIGH (ref 0.61–1.24)
GFR, Estimated: 8 mL/min — ABNORMAL LOW (ref >60)
Glucose, Bld: 154 mg/dL — ABNORMAL HIGH (ref 70–99)
Potassium: 4.9 mmol/L (ref 3.5–5.1)
Sodium: 145 mmol/L (ref 135–145)
Total Bilirubin: 0.8 mg/dL (ref 0.3–1.2)
Total Protein: 5.4 g/dL — ABNORMAL LOW (ref 6.5–8.1)

## 2022-08-03 LAB — BLOOD GAS, VENOUS
Acid-base deficit: 3.8 mmol/L — ABNORMAL HIGH (ref 0.0–2.0)
Bicarbonate: 21.5 mmol/L (ref 20.0–28.0)
O2 Saturation: 79.6 %
Patient temperature: 35.9
pCO2, Ven: 37 mmHg — ABNORMAL LOW (ref 44–60)
pH, Ven: 7.37 (ref 7.25–7.43)
pO2, Ven: 44 mmHg (ref 32–45)

## 2022-08-03 LAB — TSH: TSH: 8.994 u[IU]/mL — ABNORMAL HIGH (ref 0.350–4.500)

## 2022-08-03 LAB — URINALYSIS, ROUTINE W REFLEX MICROSCOPIC
Bilirubin Urine: NEGATIVE
Glucose, UA: NEGATIVE mg/dL
Ketones, ur: 5 mg/dL — AB
Nitrite: NEGATIVE
Protein, ur: >=300 mg/dL — AB
RBC / HPF: >50 RBC/hpf — ABNORMAL HIGH (ref 0–5)
Specific Gravity, Urine: 1.014 (ref 1.005–1.030)
WBC, UA: >50 WBC/hpf — ABNORMAL HIGH (ref 0–5)
pH: 5 (ref 5.0–8.0)

## 2022-08-03 LAB — PROTIME-INR
INR: 1.6 — ABNORMAL HIGH (ref 0.8–1.2)
Prothrombin Time: 18.5 seconds — ABNORMAL HIGH (ref 11.4–15.2)

## 2022-08-03 LAB — GLUCOSE, CAPILLARY
Glucose-Capillary: 102 mg/dL — ABNORMAL HIGH (ref 70–99)
Glucose-Capillary: 105 mg/dL — ABNORMAL HIGH (ref 70–99)

## 2022-08-03 LAB — PREPARE RBC (CROSSMATCH)

## 2022-08-03 LAB — CBG MONITORING, ED
Glucose-Capillary: 62 mg/dL — ABNORMAL LOW (ref 70–99)
Glucose-Capillary: 105 mg/dL — ABNORMAL HIGH (ref 70–99)
Glucose-Capillary: 109 mg/dL — ABNORMAL HIGH (ref 70–99)

## 2022-08-03 LAB — LACTIC ACID, PLASMA
Lactic Acid, Venous: 2.3 mmol/L (ref 0.5–1.9)
Lactic Acid, Venous: 1.5 mmol/L (ref 0.5–1.9)
Lactic Acid, Venous: 1.2 mmol/L (ref 0.5–1.9)

## 2022-08-03 LAB — AMMONIA: Ammonia: 19 umol/L (ref 9–35)

## 2022-08-03 MED ORDER — SODIUM CHLORIDE 0.9% IV SOLUTION: Freq: Once | INTRAVENOUS | Status: DC

## 2022-08-03 MED ORDER — POLYETHYLENE GLYCOL 3350 17 G PO PACK: 17.0000 g | PACK | Freq: Every day | ORAL | Status: DC | PRN

## 2022-08-03 MED ORDER — LACTATED RINGERS IV BOLUS (SEPSIS)
1000.0000 mL | Freq: Once | INTRAVENOUS | Status: AC
  Administered 2022-08-03: 1000 mL via INTRAVENOUS

## 2022-08-03 MED ORDER — DOCUSATE SODIUM 100 MG PO CAPS: 100.0000 mg | ORAL_CAPSULE | Freq: Two times a day (BID) | ORAL | Status: DC | PRN

## 2022-08-03 MED ORDER — SODIUM CHLORIDE 0.9 % IV SOLN
1.0000 g | INTRAVENOUS | Status: DC
  Administered 2022-08-04 – 2022-08-07 (×4): 1 g via INTRAVENOUS
  Filled 2022-08-03 (×5): qty 10

## 2022-08-03 MED ORDER — SODIUM CHLORIDE 0.9 % IV SOLN
2.0000 g | Freq: Once | INTRAVENOUS | Status: AC
  Administered 2022-08-03: 2 g via INTRAVENOUS
  Filled 2022-08-03: qty 12.5

## 2022-08-03 MED ORDER — LACTATED RINGERS IV BOLUS
1000.0000 mL | Freq: Once | INTRAVENOUS | Status: AC
  Administered 2022-08-03: 1000 mL via INTRAVENOUS

## 2022-08-03 MED ORDER — SODIUM CHLORIDE 0.9 % IV BOLUS
1000.0000 mL | Freq: Once | INTRAVENOUS | Status: AC
  Administered 2022-08-03: 1000 mL via INTRAVENOUS

## 2022-08-03 MED ORDER — VANCOMYCIN VARIABLE DOSE PER UNSTABLE RENAL FUNCTION (PHARMACIST DOSING): Status: DC

## 2022-08-03 MED ORDER — SODIUM CHLORIDE 0.9 % IV BOLUS: 1000.0000 mL | Freq: Once | INTRAVENOUS | Status: DC

## 2022-08-03 MED ORDER — LACTATED RINGERS IV BOLUS (SEPSIS)
250.0000 mL | Freq: Once | INTRAVENOUS | Status: AC
  Administered 2022-08-03: 250 mL via INTRAVENOUS

## 2022-08-03 MED ORDER — NOREPINEPHRINE 4 MG/250ML-% IV SOLN
0.0000 ug/min | INTRAVENOUS | Status: DC
  Administered 2022-08-03: 2 ug/min via INTRAVENOUS
  Administered 2022-08-04: 20 ug/min via INTRAVENOUS
  Filled 2022-08-03 (×3): qty 250

## 2022-08-03 MED ORDER — VANCOMYCIN HCL IN DEXTROSE 1-5 GM/200ML-% IV SOLN
1000.0000 mg | Freq: Once | INTRAVENOUS | Status: AC
  Administered 2022-08-03: 1000 mg via INTRAVENOUS
  Filled 2022-08-03: qty 200

## 2022-08-03 MED ORDER — LACTATED RINGERS IV SOLN: INTRAVENOUS | Status: AC

## 2022-08-03 MED ORDER — METRONIDAZOLE 500 MG/100ML IV SOLN
500.0000 mg | Freq: Once | INTRAVENOUS | Status: AC
  Administered 2022-08-03: 500 mg via INTRAVENOUS
  Filled 2022-08-03: qty 100

## 2022-08-03 MED ORDER — LACTATED RINGERS IV BOLUS (SEPSIS)
500.0000 mL | Freq: Once | INTRAVENOUS | Status: AC
  Administered 2022-08-03: 500 mL via INTRAVENOUS

## 2022-08-03 MED ORDER — DEXTROSE 50 % IV SOLN
25.0000 mL | Freq: Once | INTRAVENOUS | Status: AC
  Administered 2022-08-03: 25 mL via INTRAVENOUS
  Filled 2022-08-03: qty 50

## 2022-08-03 NOTE — H&P (Signed)
NAME:  Carl Bell, MRN:  [id number], DOB:  11-28-1950, LOS: 0 ADMISSION DATE:  08/06/2022, CONSULTATION DATE:  07/19/2022 REFERRING MD:  Jacalyn Lefevre, MD, CHIEF COMPLAINT:  Septic shock   History of Present Illness:  72 year old male SNR resident who presents for altered mental status. Blood glucose 46 however unable to take PO due to mental status. In the ED given D50. Found profoundly hypotensive and hypothermic with rectal temp 90. Given IVF,  Vanc/Cefepime/Flagyl. CXR right basilar infiltrate/atelectasis with small right pleural effusion. Started on levophed. Currently on 4 mcg/min. Pertinent labs include WBC 3.8 Hg 6.5. LA 2.3. PCCM consulted for admission.  Of note, he had recent hospitalization (06/22/22-07/18/22) for sigmoid volvulus s/p decompression colonoscopy with subsequent Hartman's procedure. Also had ascites requiring paracentesis that resolved after this. He was previously hospice before this however rescinded for the above interventions. He was discharged to [facility] and reportedly combative with dislodged foley. Unfortunately [name] not accept patient back and he remained in the ED as a boarder from 1/5-1/17 until he was accepted at [facility]. During his boarded time he was treated for pseudomonas UTI with Cipro  [name] is HCPOA. ED MD confirmed full code status  In the ED patient not interactive but protecting airway. On levophed 15 and hypotensive  Pertinent  Medical History  COPD with emphysema, chronic hypoxemic respiratory failure on 2L at home, hx lung cancer, HTN, chronic vision loss Significant Hospital Events: Including procedures, antibiotic start and stop dates in addition to other pertinent events   12/10-1/5 Hospital admission for sigmoid volvulus 1/5-1/17 ED boarder due to placement issues 1/21 Admit to PCCM after presenting with AMS  Interim History / Subjective:  As above  Objective   Blood pressure (S) (!) 72/40, pulse (!) 50,  temperature (!) 90 F (32.2 C), temperature source Rectal, resp. rate (!) 26, SpO2 100 %.       No intake or output data in the 24 hours ending 08/10/2022 1742 There were no vitals filed for this visit.  Physical Exam: General: Critically ill-appearing, responsive to noxious stimuli HENT: [place], AT, OP clear, dry mucous membrane Eyes: EOMI, no scleral icterus Respiratory: Rhonchi bilaterally.  No crackles, wheezing or rales Cardiovascular: RRR, -M/R/G, no JVD GI: BS+, soft, nontender Extremities: 1+ pitting edema in lower extremities,-tenderness Neuro: Eyes open, grimaces to noxious stimuli Skin: Right PAC GU: Foley in place   Resolved Hospital Problem list   N/A  Assessment & Plan:   Acute metabolic encephalopathy CT head pending Ammonia, TSH and cultures ordered  Hypovolemic +/- Septic shock, unknown etiology Wean levophed for MAP goal >65 S/p 2.75L IVF. Additional bolus ordered On maintenance LR Continue broad spectrum antibiotics De-escalate pending culture data Trend LA  Acute blood loss anemia, no evidence of bleeding PRBC x 1 ordered Trend CBC Check INR  Acute kidney failure Cr 6.93, previously normal Monitor UOP/Cr IVF fluid resuscitation as above Avoid nephrotoxic agents  COPD with emphysema Chronic hypoxemic respiratory failure on 2L at home Wean supplemental O2 for goal >88%  Guarded prognosis. High likelihood for in-hospital death. Family confirmed full code status in the ED. Consult Palliative care  Best Practice (right click and "Reselect all SmartList Selections" daily)   Diet/type: NPO DVT prophylaxis: other Hold for now GI prophylaxis: N/A Lines: N/A Foley:  N/A Code Status:  full code Last date of multidisciplinary goals of care discussion [1/21] Confirmed by ED  Labs   CBC: Recent Labs  Lab 07/20/2022 1518  WBC 3.8*  NEUTROABS 3.2  HGB 6.5*  HCT 20.4*  MCV 75.6*  PLT 170    Basic Metabolic Panel: Recent Labs  Lab 08/04/2022 1432   NA 145  K 4.9  CL 107  CO2 14*  GLUCOSE 154*  BUN 63*  CREATININE 6.93*  CALCIUM 7.5*   GFR: CrCl cannot be calculated (Unknown ideal weight.). Recent Labs  Lab 07/30/2022 1432 08/02/2022 1518  WBC  --  3.8*  LATICACIDVEN 2.3*  --     Liver Function Tests: Recent Labs  Lab 07/17/2022 1432  AST 26  ALT 12  ALKPHOS 45  BILITOT 0.8  PROT 5.4*  ALBUMIN 1.5*   No results for input(s): "LIPASE", "AMYLASE" in the last 168 hours. No results for input(s): "AMMONIA" in the last 168 hours.  ABG    Component Value Date/Time   PHART 7.45 08/03/2020 1613   PCO2ART 34 08/03/2020 1613   PO2ART 82 (L) 08/03/2020 1613   HCO3 29.3 (H) 07/07/2021 0925   ACIDBASEDEF 0.0 08/03/2020 1613   O2SAT 61.1 07/07/2021 0925     Coagulation Profile: No results for input(s): "INR", "PROTIME" in the last 168 hours.  Cardiac Enzymes: No results for input(s): "CKTOTAL", "CKMB", "CKMBINDEX", "TROPONINI" in the last 168 hours.  HbA1C: Hgb A1c MFr Bld  Date/Time Value Ref Range Status  08/03/2020 04:13 PM 5.3 4.8 - 5.6 % Final    Comment:    (NOTE) Pre diabetes:          5.7%-6.4%  Diabetes:              >6.4%  Glycemic control for   <7.0% adults with diabetes   12/24/2017 04:56 AM 5.5 4.8 - 5.6 % Final    Comment:    (NOTE) Pre diabetes:          5.7%-6.4% Diabetes:              >6.4% Glycemic control for   <7.0% adults with diabetes     CBG: Recent Labs  Lab 07/20/2022 1428  GLUCAP 62*    Review of Systems:   Unable to obtain due to critical illness Past Medical History:  He,  has a past medical history of COPD (chronic obstructive pulmonary disease) (HCC), Emphysema of lung (HCC), Glaucoma, Hypertension, and Lung cancer (HCC).   Surgical History:   Past Surgical History:  Procedure Laterality Date   COLON RESECTION SIGMOID N/A 06/23/2022   Procedure: COLON RESECTION SIGMOID;  Surgeon: Sung Amabile, DO;  Location: [facility] ORS;  Service: General;  Laterality: N/A;    COLONOSCOPY N/A 06/22/2022   Procedure: COLONOSCOPY;  Surgeon: Sung Amabile, DO;  Location: [facility] ENDOSCOPY;  Service: General;  Laterality: N/A;   COLOSTOMY  06/23/2022   Procedure: COLOSTOMY;  Surgeon: Sung Amabile, DO;  Location: [facility] ORS;  Service: General;;   LOWER EXTREMITY ANGIOGRAPHY Left 07/11/2019   Procedure: LOWER EXTREMITY ANGIOGRAPHY;  Surgeon: Annice Needy, MD;  Location: [facility] INVASIVE CV LAB;  Service: Cardiovascular;  Laterality: Left;   LUNG BIOPSY     PERIPHERAL VASCULAR CATHETERIZATION N/A 09/24/2015   Procedure: Shelda Pal Cath Insertion;  Surgeon: Annice Needy, MD;  Location: [facility] INVASIVE CV LAB;  Service: Cardiovascular;  Laterality: N/A;   Port a cath placement       Social History:   reports that he has been smoking cigarettes. He has a 25.00 pack-year smoking history. He has never used smokeless tobacco. He reports current alcohol use. He reports that he does not use drugs.  Family History:  His family history includes Diabetes Mellitus II in his mother; Hypertension in his father.   Allergies No Known Allergies   Home Medications  Prior to Admission medications   Medication Sig Start Date End Date Taking? Authorizing Provider  dorzolamide-timolol (COSOPT) 22.3-6.8 MG/ML ophthalmic solution Place 1 drop into both eyes 2 (two) times daily. 07/10/20  Yes [provider]  latanoprost (XALATAN) 0.005 % ophthalmic solution Place 1 drop into both eyes at bedtime. 07/10/20  Yes [provider]  polyethylene glycol (MIRALAX / GLYCOLAX) 17 g packet Take 17 g by mouth daily. 07/01/22  Yes Sakai, Isami, DO  tamsulosin (FLOMAX) 0.4 MG CAPS capsule Take 0.4 mg by mouth. 07/31/22  Yes [provider]     Critical care time: 80 min     The patient is critically ill with multiple organ systems failure and requires high complexity decision making for assessment and support, frequent evaluation and titration of therapies, application of advanced  monitoring technologies and extensive interpretation of multiple databases.  Independent Critical Care Time: 80 Minutes.   Mechele Collin, M.D. [facility] Pulmonary/Critical Care Medicine 07/16/2022 6:53 PM   Please see Amion for pager number to reach on-call Pulmonary and Critical Care Team.

## 2022-08-03 NOTE — Progress Notes (Signed)
Pharmacy Antibiotic Note  Carl Bell is a 72 y.o. male for which pharmacy has been consulted for cefepime and vancomycin dosing for sepsis.  Patient with a history of COPD, emphysema, glaucoma, HTN, lung cancer. Discharged from hospital 1/5 following sigmoid volvulus. Patient was discharged with a foley at that time. Patient presenting with AMS from SNF.  SCr 6.93 - acute WBC 3.8; LA 2.3; T 90; HR 50; RR 26  1/8 Ucx w/ pseudomonas aeruginosa - cefepime sensitive  Plan: Metronidazole per MD Cefepime 1g q24hr Vancomycin 1000 mg once, subsequent dosing as indicated per random vancomycin level until renal function stable and/or improved, at which time scheduled dosing can be considered Trend WBC, Fever, Renal function F/u cultures, clinical course, WBC De-escalate when able     Temp (24hrs), Avg:93.4 F (34.1 C), Min:90 F (32.2 C), Max:96.7 F (35.9 C)  No results for input(s): "WBC", "CREATININE", "LATICACIDVEN", "VANCOTROUGH", "VANCOPEAK", "VANCORANDOM", "GENTTROUGH", "GENTPEAK", "GENTRANDOM", "TOBRATROUGH", "TOBRAPEAK", "TOBRARND", "AMIKACINPEAK", "AMIKACINTROU", "AMIKACIN" in the last 168 hours.  CrCl cannot be calculated (Unknown ideal weight.).    No Known Allergies  Antimicrobials this admission: vancomycin 1/21 >>  cefepime 1/21 >>  flagyl 1/21 >>   Microbiology results: Pending  Thank you for allowing pharmacy to be a part of this patient's care.  Delmar Landau, PharmD, BCPS 07/23/2022 3:23 PM ED Clinical Pharmacist -  [phone]

## 2022-08-03 NOTE — Sepsis Progress Note (Signed)
Sepsis protocol is being followed by eLink. 

## 2022-08-03 NOTE — Progress Notes (Signed)
eLink Physician-Brief Progress Note Patient Name: Carl Bell DOB: 06-05-1951 MRN: [id number]   Date of Service  07/27/2022  HPI/Events of Note  72/M with recent hospitalization for sigmoid volvulus, s/p Hartman's, now preseting with AMS. On evaluation, pt was hypoglycemic, hypothermic, and hypotensive. He was given fluids, empiric antibiotics, and started on levophed.   eICU Interventions  Sepsis/septic shock - Potential sources include pneumonia, UTI - Started on broad spectrum empiric antibiotics.  - Will follow cultures and deescalate as appropriate - Given initial bolus in the ED. Continue to monitor I/Os, daily weights - Started on levophed. Titrate to target MAP >65 - If with increased levophed requirement, would plan to start vasopressin, stress dose steroids - Trend WBC, lactate, procal, temperature curve.   2. Hypoglycemia - Likely due to the above.  - Given D50 with appropriate response - Will continue to monitor glucose trends. Plan to start D5 fluids if with recurrent hypoglycemia  3. AKI  - Etiology unclear, may be from decreased renal perfusion from shock - Maintain MAP >65 - Monitor I/Os, daily weights - Renally dose medications, avoid nephrotoxins - Monitor electrolytes, correct abnormalities as warranted.   4. Anemia - Pt with baseline anemia, with hemoglobin around 7.5 - Pt to receive 1 unit pRBC as hgb down to 6.5 - Will continue to monitor closely for source of bleed.   5. Encephalopathy - Likely due to the combination of the above active issues.  - Will continue to monitor mental status closely.           Richie Bonanno M DELA CRUZ 07/14/2022, 10:43 PM

## 2022-08-03 NOTE — ED Provider Notes (Signed)
[facility] Provider Note   CSN: [id number] Arrival date & time: 08/13/2022  1418     History  Chief Complaint  Patient presents with   Altered Mental Status    Carl Bell is a 72 y.o. male.  Patient is a 72 year old male who presents from skilled nursing facility with altered mental status.  Apparently per EMS, nursing home staff was unable to give much information.  They were unable to tell them what his baseline mental status was but they felt he was not acting right.  On EMS arrival, he would respond to stimuli.  He was noted to have a blood sugar of 46.  However they were not able to get IV access and did not feel that he was alert enough to get oral glucose at that time.  They were unable to get an accurate blood pressure on the patient.  They did note his heart rate was little tachycardic.  No other information is able to be obtained.  Per chart review, he was admitted from December 10 to January 5 for treatment of a volvulus.  He has noted to have chronic vision loss, COPD on chronic oxygen at 2 L/min and hypertension.  He was brought back to the ED for combative behavior on January 6.  The nursing facility refused to take him back and he said in the ED until January 17 trying to find placement.       Home Medications Prior to Admission medications   Medication Sig Start Date End Date Taking? Authorizing Provider  dorzolamide-timolol (COSOPT) 22.3-6.8 MG/ML ophthalmic solution Place 1 drop into both eyes 2 (two) times daily. 07/10/20   [provider]  latanoprost (XALATAN) 0.005 % ophthalmic solution Place 1 drop into both eyes at bedtime. 07/10/20   [provider]  polyethylene glycol (MIRALAX / GLYCOLAX) 17 g packet Take 17 g by mouth daily. Patient taking differently: Take 17 g by mouth daily as needed for mild constipation. 07/01/22   [name], DO      Allergies    Patient has no known allergies.     Review of Systems   Review of Systems  Unable to perform ROS: Mental status change    Physical Exam Updated Vital Signs BP 113/81   Temp (!) 90 F (32.2 C) (Rectal)   Resp (!) 22  Physical Exam Constitutional:      Comments: Chronically ill-appearing  HENT:     Head: Normocephalic and atraumatic.     Mouth/Throat:     Mouth: Mucous membranes are dry.  Cardiovascular:     Rate and Rhythm: Tachycardia present.  Pulmonary:     Effort: Pulmonary effort is normal.     Breath sounds: Rhonchi present.  Abdominal:     General: Abdomen is flat. There is no distension.     Palpations: Abdomen is soft.     Tenderness: There is no abdominal tenderness.  Musculoskeletal:     Comments: Mild swelling to lower extremities bilaterally with chronic skin changes noted  Neurological:     Comments: Patient is awake and will push your hands away if you try to do something.  He seems to be moving both sides symmetrically.  He is not following commands.     ED Results / Procedures / Treatments   Labs (all labs ordered are listed, but only abnormal results are displayed) Labs Reviewed  CBG MONITORING, ED - Abnormal; Notable for the following components:  Result Value   Glucose-Capillary 62 (*)    All other components within normal limits  CULTURE, BLOOD (ROUTINE X 2)  CULTURE, BLOOD (ROUTINE X 2)  CBC WITH DIFFERENTIAL/PLATELET  COMPREHENSIVE METABOLIC PANEL  URINALYSIS, ROUTINE W REFLEX MICROSCOPIC  LACTIC ACID, PLASMA  CBC WITH DIFFERENTIAL/PLATELET  CBG MONITORING, ED  I-STAT ARTERIAL BLOOD GAS, ED    EKG EKG Interpretation  Date/Time:  Sunday August 03 2022 14:35:28 EST Ventricular Rate:  131 PR Interval:    QRS Duration: 101 QT Interval:  348 QTC Calculation: 514 R Axis:   87 Text Interpretation: Atrial fibrillation vs sinus tach with artifact Borderline right axis deviation Borderline repolarization abnormality Prolonged QT interval Baseline wander in lead(s) V6  Confirmed by Malvin Johns [phone]) on 07/19/2022 2:43:12 PM  Radiology DG Chest Port 1 View  Result Date: 07/19/2022 CLINICAL DATA:  Altered mental status. EXAM: PORTABLE CHEST 1 VIEW COMPARISON:  04/13/2022 FINDINGS: 1438 hours. Lungs are hyperexpanded. Hazy opacity at the right base is new in the interval and may be atelectasis with small layering right pleural effusion. The cardiopericardial silhouette is within normal limits for size. Right Port-A-Cath again noted. Telemetry leads overlie the chest. IMPRESSION: New hazy opacity at the right base may be atelectasis. There is a small layering right pleural effusion. Electronically Signed   By: Misty Stanley M.D.   On: 08/06/2022 14:58    Procedures Procedures    Medications Ordered in ED Medications  norepinephrine (LEVOPHED) 4mg  in 262mL (0.016 mg/mL) premix infusion (has no administration in time range)  lactated ringers infusion (has no administration in time range)  lactated ringers bolus 1,000 mL (has no administration in time range)    And  lactated ringers bolus 500 mL (has no administration in time range)    And  lactated ringers bolus 250 mL (has no administration in time range)  ceFEPIme (MAXIPIME) 2 g in sodium chloride 0.9 % 100 mL IVPB (has no administration in time range)  metroNIDAZOLE (FLAGYL) IVPB 500 mg (has no administration in time range)  vancomycin (VANCOCIN) IVPB 1000 mg/200 mL premix (has no administration in time range)  dextrose 50 % solution 25 mL (25 mLs Intravenous Given 07/21/2022 1448)  sodium chloride 0.9 % bolus 1,000 mL (1,000 mLs Intravenous New Bag/Given 07/23/2022 1452)    ED Course/ Medical Decision Making/ A&P                             Medical Decision Making Amount and/or Complexity of Data Reviewed Labs: ordered. Radiology: ordered.  Risk Prescription drug management.   Patient is a 72 year old who was brought in for change in mental status.  He was also noted to be hypoglycemic.  He was  given some D50 here in the ED.  We had a difficult time obtaining an accurate blood pressure.  Only, was able to get a systolic reading with a Doppler on his radial.  I was able to get a reading between 50 and 60 systolic.  Once this was obtained and he was also noted to be hypothermic with a rectal temp of 90, he was started on septic protocol.  He was started on IV fluids with a full 30 cc/kg less than IV antibiotics.  Chest x-ray shows some possible pneumonia.  This was interpreted by me and confirmed by the radiologist.  Other labs are still pending.  He was also started on Levophed drip.  Care turned over to Dr. Gilford Raid  pending lab results.  Will need admission following this.   CRITICAL CARE Performed by: Rolan Bucco Total critical care time: 40 minutes Critical care time was exclusive of separately billable procedures and treating other patients. Critical care was necessary to treat or prevent imminent or life-threatening deterioration. Critical care was time spent personally by me on the following activities: development of treatment plan with patient and/or surrogate as well as nursing, discussions with consultants, evaluation of patient's response to treatment, examination of patient, obtaining history from patient or surrogate, ordering and performing treatments and interventions, ordering and review of laboratory studies, ordering and review of radiographic studies, pulse oximetry and re-evaluation of patient's condition.   Final Clinical Impression(s) / ED Diagnoses Final diagnoses:  None    Rx / DC Orders ED Discharge Orders     None         Rolan Bucco, MD 08/11/2022 [phone]

## 2022-08-03 NOTE — ED Notes (Signed)
Son gave verbal consent to ICU to doc for blood transfusion

## 2022-08-03 NOTE — ED Notes (Addendum)
Me and nurse could not hear to get manual BP

## 2022-08-03 NOTE — ED Triage Notes (Signed)
Pt BIB EMS due to AMS and hypoglycemia. Staff walked in on pt and states he was not acting right, but staff does not know pts baseline. Pt uis responsive to pain on arrival. EMS states BS was 46 but no intervention was done. EMS did not get vital signs,.

## 2022-08-03 NOTE — ED Provider Notes (Signed)
Pt signed out to by Dr. Fredderick Phenix.  Pt is critically ill with sepsis.  Code sepsis called and he was given sepsis fluids and IV vanc, flagyl, and maxipime.  Pt's hgb has dropped from 7.7 on 1/8 to 6.5 today.  CMP with BUN up to 63 and Cr 6.93 (20 and 1.21 on 1/8); UA with lg LE, and >50 rbc and >50 wbc.  Last urine Cx grew out pseudomonas which was sensitive to cipro.  He was on cipro starting on 1/10 and finished course while here.  Lactic elevated at 2.3.  Anemia is likely due to renal failure.  Stool in colostomy is yellow.  1 unit prbc ordered for transfusion.  I attempted to call several family members, but voice mails were full.  Pt's [name] (makes the healthcare decisions) is here now.  I spoke with him and he consents for blood.  He also wants pt to be full code.  I tried to explain that pt is extremely ill.  He wants everything done.  Pt d/w CCM for admission.  CRITICAL CARE Performed by: Jacalyn Lefevre   Total critical care time: 45 minutes  Critical care time was exclusive of separately billable procedures and treating other patients.  Critical care was necessary to treat or prevent imminent or life-threatening deterioration.  Critical care was time spent personally by me on the following activities: development of treatment plan with patient and/or surrogate as well as nursing, discussions with consultants, evaluation of patient's response to treatment, examination of patient, obtaining history from patient or surrogate, ordering and performing treatments and interventions, ordering and review of laboratory studies, ordering and review of radiographic studies, pulse oximetry and re-evaluation of patient's condition.    Jacalyn Lefevre, MD 07/17/2022 1726

## 2022-08-03 NOTE — ED Notes (Signed)
Attempted to give report to [facility], per staff they stated they currently don't have a RN to receive pt. [facility] stated they would contact this RN shortly.

## 2022-08-03 NOTE — Progress Notes (Signed)
RT attempted ABG without success. RN and MD made aware.

## 2022-08-03 NOTE — ED Notes (Addendum)
Systolic 72 per doppler and manual BP machine

## 2022-08-03 NOTE — ED Notes (Addendum)
Systolic 40 per doppler and manual BP machine

## 2022-08-04 ENCOUNTER — Inpatient Hospital Stay (HOSPITAL_COMMUNITY): Payer: Medicare HMO

## 2022-08-04 DIAGNOSIS — R579 Shock, unspecified: Secondary | ICD-10-CM | POA: Diagnosis not present

## 2022-08-04 LAB — CORTISOL: Cortisol, Plasma: 18.2 ug/dL

## 2022-08-04 LAB — BPAM RBC
Blood Product Expiration Date: 202402242359
ISSUE DATE / TIME: 202401211844
Unit Type and Rh: 5100

## 2022-08-04 LAB — CBC
HCT: 22.8 % — ABNORMAL LOW (ref 39.0–52.0)
HCT: 21.3 % — ABNORMAL LOW (ref 39.0–52.0)
Hemoglobin: 7.7 g/dL — ABNORMAL LOW (ref 13.0–17.0)
Hemoglobin: 7 g/dL — ABNORMAL LOW (ref 13.0–17.0)
MCH: 25.8 pg — ABNORMAL LOW (ref 26.0–34.0)
MCH: 25.4 pg — ABNORMAL LOW (ref 26.0–34.0)
MCHC: 33.8 g/dL (ref 30.0–36.0)
MCHC: 32.9 g/dL (ref 30.0–36.0)
MCV: 76.3 fL — ABNORMAL LOW (ref 80.0–100.0)
MCV: 77.2 fL — ABNORMAL LOW (ref 80.0–100.0)
Platelets: 170 10*3/uL (ref 150–400)
Platelets: 127 10*3/uL — ABNORMAL LOW (ref 150–400)
RBC: 2.99 MIL/uL — ABNORMAL LOW (ref 4.22–5.81)
RBC: 2.76 MIL/uL — ABNORMAL LOW (ref 4.22–5.81)
RDW: 18.4 % — ABNORMAL HIGH (ref 11.5–15.5)
RDW: 17.9 % — ABNORMAL HIGH (ref 11.5–15.5)
WBC: 6 10*3/uL (ref 4.0–10.5)
WBC: 6.5 10*3/uL (ref 4.0–10.5)
nRBC: 0.5 % — ABNORMAL HIGH (ref 0.0–0.2)
nRBC: 1.2 % — ABNORMAL HIGH (ref 0.0–0.2)

## 2022-08-04 LAB — TYPE AND SCREEN
ABO/RH(D): O POS
Antibody Screen: NEGATIVE
Unit division: 0

## 2022-08-04 LAB — MRSA NEXT GEN BY PCR, NASAL: MRSA by PCR Next Gen: NOT DETECTED

## 2022-08-04 LAB — GLUCOSE, CAPILLARY
Glucose-Capillary: 76 mg/dL (ref 70–99)
Glucose-Capillary: 76 mg/dL (ref 70–99)
Glucose-Capillary: 81 mg/dL (ref 70–99)
Glucose-Capillary: 81 mg/dL (ref 70–99)
Glucose-Capillary: 97 mg/dL (ref 70–99)
Glucose-Capillary: 98 mg/dL (ref 70–99)

## 2022-08-04 MED ORDER — MIDAZOLAM HCL 2 MG/2ML IJ SOLN
INTRAMUSCULAR | Status: AC
  Administered 2022-08-04: 2 mg via INTRAVENOUS
  Filled 2022-08-04: qty 2

## 2022-08-04 MED ORDER — MORPHINE SULFATE (PF) 2 MG/ML IV SOLN
1.0000 mg | INTRAVENOUS | Status: AC | PRN
  Administered 2022-08-04: 1 mg via INTRAVENOUS
  Administered 2022-08-04 (×2): 2 mg via INTRAVENOUS
  Filled 2022-08-04 (×3): qty 1

## 2022-08-04 MED ORDER — FENTANYL CITRATE PF 50 MCG/ML IJ SOSY: 25.0000 ug | PREFILLED_SYRINGE | INTRAMUSCULAR | Status: AC | PRN

## 2022-08-04 MED ORDER — ALBUMIN HUMAN 25 % IV SOLN
25.0000 g | Freq: Four times a day (QID) | INTRAVENOUS | Status: AC
  Administered 2022-08-04 (×3): 25 g via INTRAVENOUS
  Filled 2022-08-04 (×3): qty 100

## 2022-08-04 MED ORDER — CHLORHEXIDINE GLUCONATE CLOTH 2 % EX PADS
6.0000 | MEDICATED_PAD | Freq: Every day | CUTANEOUS | Status: DC
  Administered 2022-08-04 – 2022-08-08 (×5): 6 via TOPICAL

## 2022-08-04 MED ORDER — NOREPINEPHRINE 16 MG/250ML-% IV SOLN
0.0000 ug/min | INTRAVENOUS | Status: DC
  Administered 2022-08-04: 20 ug/min via INTRAVENOUS
  Administered 2022-08-04: 25 ug/min via INTRAVENOUS
  Administered 2022-08-05: 9 ug/min via INTRAVENOUS
  Administered 2022-08-05: 25 ug/min via INTRAVENOUS
  Administered 2022-08-07: 16 ug/min via INTRAVENOUS
  Filled 2022-08-04 (×5): qty 250

## 2022-08-04 MED ORDER — VASOPRESSIN 20 UNITS/100 ML INFUSION FOR SHOCK
0.0000 [IU]/min | INTRAVENOUS | Status: DC
  Administered 2022-08-04 – 2022-08-08 (×9): 0.03 [IU]/min via INTRAVENOUS
  Filled 2022-08-04 (×11): qty 100

## 2022-08-04 MED ORDER — SODIUM CHLORIDE 0.9 % IV SOLN
500.0000 mg | INTRAVENOUS | Status: DC
  Administered 2022-08-04 – 2022-08-07 (×4): 500 mg via INTRAVENOUS
  Filled 2022-08-04 (×4): qty 5

## 2022-08-04 MED ORDER — MIDAZOLAM HCL 2 MG/2ML IJ SOLN: 2.0000 mg | Freq: Once | INTRAMUSCULAR | Status: AC

## 2022-08-04 MED ORDER — SODIUM CHLORIDE 0.9 % IV SOLN: INTRAVENOUS | Status: DC | PRN

## 2022-08-04 NOTE — Progress Notes (Signed)
eLink Physician-Brief Progress Note Patient Name: Carl Bell DOB: 05/12/1951 MRN: [id number]   Date of Service  08/04/2022  HPI/Events of Note  Patient with intermittent pain PRN morphine expired  eICU Interventions  PRN fentanyl ordered x 12 hours        [name] 08/04/2022, 8:31 PM

## 2022-08-04 NOTE — Consult Note (Signed)
WOC Nurse ostomy consult note Stoma type/location: LLQ  end colostomy Stomal assessment/size: 1 and 1/8 inch stoma, raised, moist, slightly above skin level Peristomal assessment: Not seen today Treatment options for stomal/peristomal skin: skin barrier ring Output: not seen today  Ostomy pouching: 2pc. 2 and 3/4 inch ostomy pouching system with skin barrier ring Education provided: None Enrolled patient in [employer] DC program: Yes, previously  WOC nursing team will not follow, but will remain available to this patient, the nursing and medical teams.  Please re-consult if needed.  Thank you for inviting [place] to participate in this patient's Plan of Care.  Ladona Mow, MSN, RN, CNS, GNP, Leda Min, [employer], [employer] phone:  [phone]

## 2022-08-04 NOTE — Progress Notes (Signed)
NAME:  Carl Bell, MRN:  [id number], DOB:  [birth year], LOS: 1 ADMISSION DATE:  07/18/2022, CONSULTATION DATE:  08/07/2022 REFERRING MD:  Jacalyn Lefevre, MD, CHIEF COMPLAINT:  Septic shock   History of Present Illness:  72 year old male [facility] resident who presents for altered mental status. Blood glucose 46 however unable to take PO due to mental status. In the ED given D50. Found profoundly hypotensive and hypothermic with rectal temp 90. Given IVF,  Vanc/Cefepime/Flagyl. CXR right basilar infiltrate/atelectasis with small right pleural effusion. Started on levophed. Currently on 4 mcg/min. Pertinent labs include WBC 3.8 Hg 6.5. LA 2.3. PCCM consulted for admission.  Of note, he had recent hospitalization (06/22/22-07/18/22) for sigmoid volvulus s/p decompression colonoscopy with subsequent Hartman's procedure. Also had ascites requiring paracentesis that resolved after this. He was previously hospice before this however rescinded for the above interventions. He was discharged to [facility] and reportedly combative with dislodged foley. Unfortunately [name] not accept patient back and he remained in the ED as a boarder from 1/5-1/17 until he was accepted at [facility]. During his boarded time he was treated for pseudomonas UTI with Cipro  [name] is HCPOA. ED MD confirmed full code status  In the ED patient not interactive but protecting airway. On levophed 15 and hypotensive  Pertinent  Medical History  COPD with emphysema, chronic hypoxemic respiratory failure on 2L at home, hx lung cancer, HTN, chronic vision loss Significant Hospital Events: Including procedures, antibiotic start and stop dates in addition to other pertinent events   12/10-1/5 Hospital admission for sigmoid volvulus 1/5-1/17 ED boarder due to placement issues 1/21 Admit to PCCM after presenting with AMS, started on vasopressors  Interim History / Subjective:   Remains on levophed which increased  overnight Remains altered   Objective   Blood pressure 110/80, pulse (!) 114, temperature 98 F (36.7 C), temperature source Axillary, resp. rate (!) 27, weight 53.3 kg, SpO2 97 %.        Intake/Output Summary (Last 24 hours) at 08/04/2022 0853 Last data filed at 08/04/2022 0800 Gross per 24 hour  Intake 2829.62 ml  Output 100 ml  Net 2729.62 ml   Filed Weights   08/04/2022 2152  Weight: 53.3 kg    Physical Exam: General: Critically ill-appearing, cachectic, responsive to noxious stimuli HENT: Golden, AT, OP clear, moist mucous membrane, poor dentition Eyes: PERRL Respiratory: course breath sounds.  No crackles, wheezing or rales Cardiovascular: RRR, -M/R/G, no JVD GI: BS+, soft, nontender Extremities: trace edema in lower extremities,-tenderness Neuro: Eyes open, grimaces to noxious stimuli Skin: Right PAC GU: Foley in place   Resolved Hospital Problem list   N/A  Assessment & Plan:   Acute metabolic encephalopathy CT head negative Ammonia WNL TSH 8  Hypovolemic +/- Septic shock, unknown etiology Wean vasopressors for MAP goal >65 Currently on vasopressin and levophed PRN fluid boluses Continue broad spectrum antibiotics with cefepime, vancomycin, flagyl and azithromycin De-escalate pending culture data Lactic acid has cleared  Acute blood loss anemia, no evidence of bleeding Trend CBC Transfused 1 unit yesterday  Acute kidney failure Cr 6.93, on admission. Baseline 0.99 - 1.2  Monitor UOP/Cr IVF fluid resuscitation as above Avoid nephrotoxic agents Check renal US  COPD with emphysema Chronic hypoxemic respiratory failure on 2L at home Wean supplemental O2 for goal >88%  Guarded prognosis. High likelihood for in-hospital death. Family confirmed full code status in the ED. Consult Palliative care  Best Practice (right click and "Reselect all SmartList  Selections" daily)   Diet/type: NPO DVT prophylaxis: other Hold for now GI prophylaxis: N/A Lines:  N/A Foley:  N/A Code Status:  full code Last date of multidisciplinary goals of care discussion [1/21] Confirmed by ED  Labs   CBC: Recent Labs  Lab 08/02/2022 1518 08/04/22 0148  WBC 3.8* 6.0  NEUTROABS 3.2  --   HGB 6.5* 7.7*  HCT 20.4* 22.8*  MCV 75.6* 76.3*  PLT 170 170    Basic Metabolic Panel: Recent Labs  Lab 08/13/2022 1432 07/22/2022 2240  NA 145 142  K 4.9 4.6  CL 107 108  CO2 14* 18*  GLUCOSE 154* 108*  BUN 63* 55*  CREATININE 6.93* 5.88*  CALCIUM 7.5* 7.4*   GFR: Estimated Creatinine Clearance: 8.4 mL/min (A) (by C-G formula based on SCr of 5.88 mg/dL (H)). Recent Labs  Lab 08/01/2022 1432 07/15/2022 1518 07/26/2022 1823 08/11/2022 2240 08/04/22 0148  WBC  --  3.8*  --   --  6.0  LATICACIDVEN 2.3*  --  1.5 1.2  --     Liver Function Tests: Recent Labs  Lab 07/28/2022 1432  AST 26  ALT 12  ALKPHOS 45  BILITOT 0.8  PROT 5.4*  ALBUMIN 1.5*   No results for input(s): "LIPASE", "AMYLASE" in the last 168 hours. Recent Labs  Lab 07/14/2022 1900  AMMONIA 19    ABG    Component Value Date/Time   PHART 7.45 08/03/2020 1613   PCO2ART 34 08/03/2020 1613   PO2ART 82 (L) 08/03/2020 1613   HCO3 21.5 07/27/2022 2237   ACIDBASEDEF 3.8 (H) 07/23/2022 2237   O2SAT 79.6 08/13/2022 2237     Coagulation Profile: Recent Labs  Lab 07/31/2022 2240  INR 1.6*    Cardiac Enzymes: No results for input(s): "CKTOTAL", "CKMB", "CKMBINDEX", "TROPONINI" in the last 168 hours.  HbA1C: Hgb A1c MFr Bld  Date/Time Value Ref Range Status  08/03/2020 04:13 PM 5.3 4.8 - 5.6 % Final    Comment:    (NOTE) Pre diabetes:          5.7%-6.4%  Diabetes:              >6.4%  Glycemic control for   <7.0% adults with diabetes   12/24/2017 04:56 AM 5.5 4.8 - 5.6 % Final    Comment:    (NOTE) Pre diabetes:          5.7%-6.4% Diabetes:              >6.4% Glycemic control for   <7.0% adults with diabetes     CBG: Recent Labs  Lab 08/07/2022 2024 07/29/2022 2150  08/11/2022 2317 08/04/22 0327 08/04/22 0725  GLUCAP 109* 102* 105* 98 97     Critical care time: 40 min     The patient is critically ill with multiple organ systems failure and requires high complexity decision making for assessment and support, frequent evaluation and titration of therapies, application of advanced monitoring technologies and extensive interpretation of multiple databases.  Independent Critical Care Time: 80 Minutes.   Melody Comas, MD [facility] Office: [phone]   See Amion for personal pager PCCM on call pager [phone] until 7pm. Please call Elink 7p-7a. [phone]

## 2022-08-04 NOTE — Procedures (Signed)
Arterial Catheter Insertion Procedure Note  Carl Bell  [id number]  1950/09/01  Date:08/04/22  Time:3:56 PM    Provider Performing: Ermalinda Memos E    Procedure: Insertion of Arterial Line (38151) without US guidance  Indication(s) Blood pressure monitoring and/or need for frequent ABGs  Consent Risks of the procedure as well as the alternatives and risks of each were explained to the patient and/or caregiver.  Consent for the procedure was obtained and is signed in the bedside chart  Anesthesia None   Time Out Verified patient identification, verified procedure, site/side was marked, verified correct patient position, special equipment/implants available, medications/allergies/relevant history reviewed, required imaging and test results available.   Sterile Technique Maximal sterile technique including full sterile barrier drape, hand hygiene, sterile gown, sterile gloves, mask, hair covering, sterile ultrasound probe cover (if used).   Procedure Description Area of catheter insertion was cleaned with chlorhexidine and draped in sterile fashion. Without real-time ultrasound guidance an arterial catheter was placed into the right radial artery.  Appropriate arterial tracings confirmed on monitor.     Complications/Tolerance None; patient tolerated the procedure well.   EBL Minimal   Specimen(s) None

## 2022-08-04 NOTE — Progress Notes (Signed)
Patient has only 40 mls cloudy urine out of foley today . I have updated Dr Francine Graven .Will continue to monitor.

## 2022-08-04 NOTE — Progress Notes (Addendum)
eLink Physician-Brief Progress Note Patient Name: Carl Bell DOB: 1950-12-28 MRN: [id number]   Date of Service  08/04/2022  HPI/Events of Note  BSRN asking for something for pain based on continous moaning/groaning, is not able to verbalize, NPO, has PIV     114/101  HR 89  Seen groaning  eICU Interventions  Will order low dose morphine for patient comfort     Intervention Category Intermediate Interventions: Pain - evaluation and management  Darl Pikes 08/04/2022, 1:35 AM

## 2022-08-05 DIAGNOSIS — Z7189 Other specified counseling: Secondary | ICD-10-CM | POA: Diagnosis not present

## 2022-08-05 DIAGNOSIS — R627 Adult failure to thrive: Secondary | ICD-10-CM | POA: Diagnosis not present

## 2022-08-05 DIAGNOSIS — N179 Acute kidney failure, unspecified: Secondary | ICD-10-CM

## 2022-08-05 DIAGNOSIS — R64 Cachexia: Secondary | ICD-10-CM | POA: Diagnosis not present

## 2022-08-05 DIAGNOSIS — A419 Sepsis, unspecified organism: Secondary | ICD-10-CM | POA: Diagnosis not present

## 2022-08-05 DIAGNOSIS — R579 Shock, unspecified: Secondary | ICD-10-CM | POA: Diagnosis not present

## 2022-08-05 LAB — COMPREHENSIVE METABOLIC PANEL
ALT: 12 U/L (ref 0–44)
AST: 22 U/L (ref 15–41)
Albumin: 2.4 g/dL — ABNORMAL LOW (ref 3.5–5.0)
Alkaline Phosphatase: 61 U/L (ref 38–126)
Anion gap: 12 (ref 5–15)
BUN: 56 mg/dL — ABNORMAL HIGH (ref 8–23)
CO2: 19 mmol/L — ABNORMAL LOW (ref 22–32)
Calcium: 7.6 mg/dL — ABNORMAL LOW (ref 8.9–10.3)
Chloride: 110 mmol/L (ref 98–111)
Creatinine, Ser: 5.86 mg/dL — ABNORMAL HIGH (ref 0.61–1.24)
GFR, Estimated: 10 mL/min — ABNORMAL LOW (ref >60)
Glucose, Bld: 87 mg/dL (ref 70–99)
Potassium: 5 mmol/L (ref 3.5–5.1)
Sodium: 141 mmol/L (ref 135–145)
Total Bilirubin: 0.8 mg/dL (ref 0.3–1.2)
Total Protein: 5.4 g/dL — ABNORMAL LOW (ref 6.5–8.1)

## 2022-08-05 LAB — POCT I-STAT 7, (LYTES, BLD GAS, ICA,H+H)
Acid-base deficit: 11 mmol/L — ABNORMAL HIGH (ref 0.0–2.0)
Bicarbonate: 15.5 mmol/L — ABNORMAL LOW (ref 20.0–28.0)
Calcium, Ion: 1.19 mmol/L (ref 1.15–1.40)
HCT: 23 % — ABNORMAL LOW (ref 39.0–52.0)
Hemoglobin: 7.8 g/dL — ABNORMAL LOW (ref 13.0–17.0)
O2 Saturation: 99 %
Potassium: 5.1 mmol/L (ref 3.5–5.1)
Sodium: 144 mmol/L (ref 135–145)
TCO2: 17 mmol/L — ABNORMAL LOW (ref 22–32)
pCO2 arterial: 36.1 mmHg (ref 32–48)
pH, Arterial: 7.24 — ABNORMAL LOW (ref 7.35–7.45)
pO2, Arterial: 187 mmHg — ABNORMAL HIGH (ref 83–108)

## 2022-08-05 LAB — GLUCOSE, CAPILLARY
Glucose-Capillary: 79 mg/dL (ref 70–99)
Glucose-Capillary: 85 mg/dL (ref 70–99)
Glucose-Capillary: 85 mg/dL (ref 70–99)
Glucose-Capillary: 93 mg/dL (ref 70–99)
Glucose-Capillary: 111 mg/dL — ABNORMAL HIGH (ref 70–99)

## 2022-08-05 LAB — URINE CULTURE

## 2022-08-05 LAB — CBC
HCT: 23.3 % — ABNORMAL LOW (ref 39.0–52.0)
Hemoglobin: 7.5 g/dL — ABNORMAL LOW (ref 13.0–17.0)
MCH: 24.9 pg — ABNORMAL LOW (ref 26.0–34.0)
MCHC: 32.2 g/dL (ref 30.0–36.0)
MCV: 77.4 fL — ABNORMAL LOW (ref 80.0–100.0)
Platelets: 125 10*3/uL — ABNORMAL LOW (ref 150–400)
RBC: 3.01 MIL/uL — ABNORMAL LOW (ref 4.22–5.81)
RDW: 18.4 % — ABNORMAL HIGH (ref 11.5–15.5)
WBC: 6.1 10*3/uL (ref 4.0–10.5)
nRBC: 2.1 % — ABNORMAL HIGH (ref 0.0–0.2)

## 2022-08-05 LAB — TROPONIN I (HIGH SENSITIVITY)
Troponin I (High Sensitivity): 71 ng/L — ABNORMAL HIGH (ref <18)
Troponin I (High Sensitivity): 68 ng/L — ABNORMAL HIGH (ref <18)

## 2022-08-05 MED ORDER — STERILE WATER FOR INJECTION IV SOLN
INTRAVENOUS | Status: AC
  Filled 2022-08-05: qty 1000

## 2022-08-05 MED ORDER — HYDROCORTISONE SOD SUC (PF) 100 MG IJ SOLR
100.0000 mg | Freq: Two times a day (BID) | INTRAMUSCULAR | Status: DC
  Administered 2022-08-05 – 2022-08-08 (×8): 100 mg via INTRAVENOUS
  Filled 2022-08-05 (×8): qty 2

## 2022-08-05 NOTE — Consult Note (Signed)
I have been asked to see the patient by Dr. Sharyn Lull, for evaluation and management of renal failure/UTI/hydronephrosis.  History of present illness: 72 year old male with poor functional status was transferred [facility] [place] [facility] to the emergency department after extremely stent there for altered mental status.  In the ER is found to be hypoglycemic, hypotensive.  Was transferred to the ICU for his hypotension.  Renal ultrasound shows generally been showing some mild right-sided pelvic caliectasis.  The patient was treated for Pseudomonas and a CAT scan demonstrated right hydroureteral nephrosis.  The ultrasound from this admission associated with his hydronephrosis has resolved.  Urinalysis demonstrated likely infected urine, return again.  In addition, admitted with acute renal failure.  History was able to be obtained from the patient, chart review was performed.   Review of systems: A 12 point comprehensive review of systems was obtained and is negative unless otherwise stated in the history of present illness.  Patient Active Problem List   Diagnosis Date Noted   Nystagmus 07/17/2022   Urinary retention due to benign prostatic hyperplasia 07/12/2022   Cirrhosis of liver with ascites (HCC) 07/12/2022   Anasarca associated with disorder of kidney 07/10/2022   Acute renal failure due to urinary obstruction (HCC) 07/10/2022   Sigmoid volvulus (HCC) 06/28/2022   Acute postoperative anemia due to expected blood loss 06/24/2022   Hyperkalemia 06/24/2022   Volvulus of intestine (HCC) 06/22/2022   Hypokalemia 06/22/2022   Aspiration pneumonia (HCC) 09/14/2021   COVID 08/04/2020   Demand ischemia 08/04/2020   SOB (shortness of breath) 08/04/2020   Primary open angle glaucoma (POAG) of both eyes, severe stage 05/13/2020   Legally blind 05/13/2020   COPD exacerbation (HCC) 11/08/2019   Acute exacerbation of chronic obstructive pulmonary disease (COPD) (HCC) 10/18/2019    Hypertensive urgency 10/17/2019   COPD with acute exacerbation (HCC) 09/12/2019   Acute on chronic respiratory failure with hypoxia and hypercapnia (HCC) 09/12/2019   Tobacco abuse 09/12/2019   PAD (peripheral artery disease) (HCC) 09/12/2019   HLD (hyperlipidemia) 09/12/2019   Atherosclerosis of native arteries of the extremities with ulceration (HCC) 06/28/2019   Sepsis (HCC) 12/24/2017   Protein-calorie malnutrition, severe 11/12/2017   Acute respiratory failure (HCC) 11/10/2017   Acute respiratory distress 12/22/2015   COPD (chronic obstructive pulmonary disease) (HCC) 12/22/2015   Acute bronchitis 12/22/2015   Hypertension 12/22/2015   Lung cancer, lingula (HCC) 09/23/2015   Chronic edema 09/15/2014    No current facility-administered medications on file prior to encounter.   Current Outpatient Medications on File Prior to Encounter  Medication Sig Dispense Refill   dorzolamide-timolol (COSOPT) 22.3-6.8 MG/ML ophthalmic solution Place 1 drop into both eyes 2 (two) times daily.     latanoprost (XALATAN) 0.005 % ophthalmic solution Place 1 drop into both eyes at bedtime.     polyethylene glycol (MIRALAX / GLYCOLAX) 17 g packet Take 17 g by mouth daily. 14 each 0   tamsulosin (FLOMAX) 0.4 MG CAPS capsule Take 0.4 mg by mouth.      Past Medical History:  Diagnosis Date   COPD (chronic obstructive pulmonary disease) (HCC)    Emphysema of lung (HCC)    Glaucoma    Hypertension    Lung cancer [facility])     Past Surgical History:  Procedure Laterality Date   COLON RESECTION SIGMOID N/A 06/23/2022   Procedure: COLON RESECTION SIGMOID;  Surgeon: Sung Amabile, DO;  Location: [facility] ORS;  Service: General;  Laterality: N/A;   COLONOSCOPY N/A 06/22/2022  Procedure: COLONOSCOPY;  Surgeon: Sung Amabile, DO;  Location: [facility] ENDOSCOPY;  Service: General;  Laterality: N/A;   COLOSTOMY  06/23/2022   Procedure: COLOSTOMY;  Surgeon: Sung Amabile, DO;  Location: [facility] ORS;  Service: General;;    LOWER EXTREMITY ANGIOGRAPHY Left 07/11/2019   Procedure: LOWER EXTREMITY ANGIOGRAPHY;  Surgeon: Annice Needy, MD;  Location: [facility] INVASIVE CV LAB;  Service: Cardiovascular;  Laterality: Left;   LUNG BIOPSY     PERIPHERAL VASCULAR CATHETERIZATION N/A 09/24/2015   Procedure: Shelda Pal Cath Insertion;  Surgeon: Annice Needy, MD;  Location: [facility] INVASIVE CV LAB;  Service: Cardiovascular;  Laterality: N/A;   Port a cath placement      Social History   Tobacco Use   Smoking status: Every Day    Packs/day: 0.50    Years: 50.00    Total pack years: 25.00    Types: Cigarettes   Smokeless tobacco: Never  Vaping Use   Vaping Use: Never used  Substance Use Topics   Alcohol use: Yes   Drug use: No    Family History  Problem Relation Age of Onset   Diabetes Mellitus II Mother    Hypertension Father     PE: Vitals:   07/28/2022 1815 08/07/2022 1830 08/06/2022 1845 07/16/2022 1900  BP:  (!) 76/57  (!) 79/63  Pulse: 100 96 98 96  Resp: (!) 23 16 (!) 21 (!) 22  Temp:      TempSrc:      SpO2: 100% 100% 99% 100%  Weight:       Patient appears to be in no acute distress  Patient is noncommunicative Atraumatic normocephalic head No cervical or supraclavicular lymphadenopathy appreciated No increased work of breathing, no audible wheezes/rhonchi Regular sinus rhythm/rate Abdomen is soft, nontender, nondistended, no CVA or suprapubic tenderness Foley catheter placed lighted Lower extremities are symmetric without appreciable edema Grossly neurologically intact No identifiable skin lesions  Recent Labs    08/04/22 0148 08/04/22 1636 07/23/2022 0010 07/19/2022 1112  WBC 6.0 6.5 6.1  --   HGB 7.7* 7.0* 7.5* 7.8*  HCT 22.8* 21.3* 23.3* 23.0*   Recent Labs    07/28/2022 1432 07/15/2022 2240 08/07/2022 0256 08/03/2022 1112  NA 145 142 141 144  K 4.9 4.6 5.0 5.1  CL 107 108 110  --   CO2 14* 18* 19*  --   GLUCOSE 154* 108* 87  --   BUN 63* 55* 56*  --   CREATININE 6.93* 5.88* 5.86*  --   CALCIUM  7.5* 7.4* 7.6*  --    Recent Labs    08/01/2022 2240  INR 1.6*   No results for input(s): "LABURIN" in the last 72 hours. Results for orders placed or performed during the hospital encounter of 08/04/2022  Culture, blood (routine x 2)     Status: None (Preliminary result)   Collection Time: 07/18/2022  3:20 PM   Specimen: BLOOD  Result Value Ref Range Status   Specimen Description BLOOD SITE NOT SPECIFIED  Final   Special Requests   Final    BOTTLES DRAWN AEROBIC AND ANAEROBIC Blood Culture adequate volume   Culture   Final    NO GROWTH 2 DAYS Performed at [facility], [address], [place], [place] [address]    Report Status PENDING  Incomplete  Culture, blood (routine x 2)     Status: None (Preliminary result)   Collection Time: 08/12/2022  3:37 PM   Specimen: BLOOD  Result Value Ref Range Status  Specimen Description BLOOD SITE NOT SPECIFIED  Final   Special Requests   Final    BOTTLES DRAWN AEROBIC AND ANAEROBIC Blood Culture results may not be optimal due to an inadequate volume of blood received in culture bottles   Culture   Final    NO GROWTH 2 DAYS Performed at [facility], [address], [place], [place] [address]    Report Status PENDING  Incomplete  Urine Culture     Status: Abnormal   Collection Time: 08/10/2022  4:25 PM   Specimen: Urine, Catheterized  Result Value Ref Range Status   Specimen Description URINE, CATHETERIZED  Final   Special Requests   Final    NONE Performed at [facility], [address], [place], [place] [address]    Culture MULTIPLE SPECIES PRESENT, SUGGEST RECOLLECTION (A)  Final   Report Status 08/09/2022 FINAL  Final  MRSA Next Gen by PCR, Nasal     Status: None   Collection Time: 08/04/22  3:16 AM   Specimen: Nasal Mucosa; Nasal Swab  Result Value Ref Range Status   MRSA by PCR Next Gen NOT DETECTED NOT DETECTED Final    Comment: (NOTE) The GeneXpert MRSA Assay (FDA approved for NASAL specimens only), is one  component of a comprehensive MRSA colonization surveillance program. It is not intended to diagnose MRSA infection nor to guide or monitor treatment for MRSA infections. Test performance is not FDA approved in patients less than 45 years old. Performed at [facility], [address], [place], [place] [address]     Imaging: CT scan performed on 07/02/2022 as well as ultrasound was performed on this hospitalization.  The findings are as noted HPI.  Imp: Sepsis, renal failure, sepsis of unknown etiology.  May be that he had an incompletely pseudomonas UTI. In my experience pseudomonal UTIs do create quite a bit encephalopathy.  I do not know that decompressing his collecting system with the stents would help with his renal failure.  Renal failure likely prerenal given that he actually had some improvement in his hydronephrosis from the previous CT scan as seen on the current ultrasound.  Recommendations: At this point, I don't recommend any aggressive treatment to decompress the urinary tract with  stents or nephrostomy tubes as I do not feel they would provide much benefit.  If things change or he worsens, please contact urology for further.  Otherwise, we will see him on an as-needed basis.  Crist Fat

## 2022-08-05 NOTE — Progress Notes (Deleted)
08/06/2022 12:46 PM   Carl Bell 1950/09/16 [id number]  Referring provider: Kirk Ruths, MD [facility] [place],  [place] [address]  Urological history: 1.  Bilateral hydronephrosis -contrast CT (06/2022)    No chief complaint on file.   HPI: Carl Bell is a 72 y.o. *** who presents today for ****   PMH: Past Medical History:  Diagnosis Date   COPD (chronic obstructive pulmonary disease) ([place])    Emphysema of lung ([place])    Glaucoma    Hypertension    Lung cancer ([place])     Surgical History: Past Surgical History:  Procedure Laterality Date   COLON RESECTION SIGMOID N/A 06/23/2022   Procedure: COLON RESECTION SIGMOID;  Surgeon: [name], DO;  Location: [facility] ORS;  Service: General;  Laterality: N/A;   COLONOSCOPY N/A 06/22/2022   Procedure: COLONOSCOPY;  Surgeon: [name], DO;  Location: [place] ENDOSCOPY;  Service: General;  Laterality: N/A;   COLOSTOMY  06/23/2022   Procedure: COLOSTOMY;  Surgeon: [name], DO;  Location: [facility] ORS;  Service: General;;   LOWER EXTREMITY ANGIOGRAPHY Left 07/11/2019   Procedure: LOWER EXTREMITY ANGIOGRAPHY;  Surgeon: Algernon Huxley, MD;  Location: [place] CV LAB;  Service: Cardiovascular;  Laterality: Left;   LUNG BIOPSY     PERIPHERAL VASCULAR CATHETERIZATION N/A 09/24/2015   Procedure: [name];  Surgeon: Algernon Huxley, MD;  Location: [place] CV LAB;  Service: Cardiovascular;  Laterality: N/A;   Port a cath placement      Home Medications:  Allergies as of 08/06/2022   No Known Allergies      Medication List      Notice   This visit is during an admission. Changes to the med list made in this visit will be reflected in the After Visit Summary of the admission.     Allergies: No Known Allergies  Family History: Family History  Problem Relation Age of Onset   Diabetes Mellitus II Mother    Hypertension Father     Social History:  reports  that he has been smoking cigarettes. He has a 25.00 pack-year smoking history. He has never used smokeless tobacco. He reports current alcohol use. He reports that he does not use drugs.  ROS: Pertinent ROS in HPI  Physical Exam: There were no vitals taken for this visit.  Constitutional:  Well nourished. Alert and oriented, No acute distress. HEENT: [place] AT, moist mucus membranes.  Trachea midline, no masses. Cardiovascular: No clubbing, cyanosis, or edema. Respiratory: Normal respiratory effort, no increased work of breathing. GI: Abdomen is soft, non tender, non distended, no abdominal masses. Liver and spleen not palpable.  No hernias appreciated.  Stool sample for occult testing is not indicated.   GU: No CVA tenderness.  No bladder fullness or masses.  Patient with circumcised/uncircumcised phallus. ***Foreskin easily retracted***  Urethral meatus is patent.  No penile discharge. No penile lesions or rashes. Scrotum without lesions, cysts, rashes and/or edema.  Testicles are located scrotally bilaterally. No masses are appreciated in the testicles. Left and right epididymis are normal. Rectal: Patient with  normal sphincter tone. Anus and perineum without scarring or rashes. No rectal masses are appreciated. Prostate is approximately *** grams, *** nodules are appreciated. Seminal vesicles are normal. Skin: No rashes, bruises or suspicious lesions. Lymph: No cervical or inguinal adenopathy. Neurologic: Grossly intact, no focal deficits, moving all 4 extremities. Psychiatric: Normal mood and affect.  Laboratory Data: Lab  Results  Component Value Date   WBC 6.1 07/25/2022   HGB 7.8 (L) 07/30/2022   HCT 23.0 (L) 08/02/2022   MCV 77.4 (L) 08/11/2022   PLT 125 (L) 08/04/2022    Lab Results  Component Value Date   CREATININE 5.86 (H) 07/15/2022    No results found for: "PSA"  No results found for: "TESTOSTERONE"  Lab Results  Component Value Date   HGBA1C 5.3 08/03/2020     Lab Results  Component Value Date   TSH 8.994 (H) 07/14/2022       Component Value Date/Time   CHOL 116 07/08/2021 0452   HDL 64 07/08/2021 0452   CHOLHDL 1.8 07/08/2021 0452   VLDL 6 07/08/2021 0452   LDLCALC 46 07/08/2021 0452    Lab Results  Component Value Date   AST 22 07/28/2022   Lab Results  Component Value Date   ALT 12 08/04/2022   No components found for: "ALKALINEPHOPHATASE" No components found for: "BILIRUBINTOTAL"  No results found for: "ESTRADIOL"  Urinalysis    Component Value Date/Time   COLORURINE AMBER (A) 08/10/2022 1432   APPEARANCEUR TURBID (A) 07/30/2022 1432   LABSPEC 1.014 07/21/2022 1432   PHURINE 5.0 07/15/2022 1432   GLUCOSEU NEGATIVE 08/10/2022 1432   HGBUR LARGE (A) 07/26/2022 1432   BILIRUBINUR NEGATIVE 07/14/2022 1432   KETONESUR 5 (A) 07/19/2022 1432   PROTEINUR >=300 (A) 07/19/2022 1432   NITRITE NEGATIVE 08/02/2022 1432   LEUKOCYTESUR LARGE (A) 08/09/2022 1432    I have reviewed the labs.   Pertinent Imaging: '@CT'$ @ '@ultrasound'$ @ '@KUB'$ @ I have independently reviewed the films.    Assessment & Plan:  ***  There are no diagnoses linked to this encounter.  No follow-ups on file.  These notes generated with voice recognition software. I apologize for typographical errors.  [place], [place] [address]  [place], [place] [address] [phone]

## 2022-08-05 NOTE — Progress Notes (Signed)
NAME:  Carl Bell, MRN:  [id number], DOB:  January 07, 1951, LOS: 2 ADMISSION DATE:  07/27/2022, CONSULTATION DATE:  07/28/2022 REFERRING MD:  Carl Lefevre, MD, CHIEF COMPLAINT:  Septic shock   History of Present Illness:  72 year old male [facility] resident who presents for altered mental status. Blood glucose 46 however unable to take PO due to mental status. In the ED given D50. Found profoundly hypotensive and hypothermic with rectal temp 90. Given IVF,  Vanc/Cefepime/Flagyl. CXR right basilar infiltrate/atelectasis with small right pleural effusion. Started on levophed. Currently on 4 mcg/min. Pertinent labs include WBC 3.8 Hg 6.5. LA 2.3. PCCM consulted for admission.  Of note, he had recent hospitalization (06/22/22-07/18/22) for sigmoid volvulus s/p decompression colonoscopy with subsequent Hartman's procedure. Also had ascites requiring paracentesis that resolved after this. He was previously hospice before this however rescinded for the above interventions. He was discharged to [facility] and reportedly combative with dislodged foley. Unfortunately [facility] did not accept patient back and he remained in the ED as a boarder from 1/5-1/17 until he was accepted at [facility]. During his boarded time he was treated for pseudomonas UTI with Cipro  Carl Bell is HCPOA. ED MD confirmed full code status  In the ED patient not interactive but protecting airway. On levophed 15 and hypotensive  Pertinent  Medical History  COPD with emphysema, chronic hypoxemic respiratory failure on 2L at home, hx lung cancer, HTN, chronic vision loss  Significant Hospital Events: Including procedures, antibiotic start and stop dates in addition to other pertinent events   12/10-1/5 Hospital admission for sigmoid volvulus 1/5-1/17 ED boarder due to placement issues 1/21 Admit to PCCM after presenting with AMS, started on vasopressors 1/22 stress dose steroids started due to being on vaso and levophed  Interim  History / Subjective:   Remains altered On levophed and vasopressin.  Levo weaned down after starting stress dose steroids  EKG showing ST segment elevations in interior leads, troponin 71    Objective   Blood pressure 95/68, pulse 86, temperature 98.4 F (36.9 C), resp. rate 13, weight 56.9 kg, SpO2 100 %.        Intake/Output Summary (Last 24 hours) at 07/17/2022 0802 Last data filed at 08/13/2022 0700 Gross per 24 hour  Intake 2101.82 ml  Output 255 ml  Net 1846.82 ml   Filed Weights   07/23/2022 2152 07/17/2022 0327  Weight: 53.3 kg 56.9 kg   Physical Exam: General: Critically ill-appearing, cachectic HENT: [place], AT, OP clear, dry mucous membrane, poor dentition Eyes: PERRL Respiratory: course breath sounds.  No crackles, wheezing or rales Cardiovascular: RRR, -M/R/G, no JVD GI: BS+, soft, nontender Extremities: trace edema in lower extremities,-tenderness Neuro: awake, not alert or oriented. Not following commands Skin: Right PAC GU: Foley in place   Resolved Hospital Problem list   N/A  Assessment & Plan:   Acute metabolic encephalopathy In setting of sepsis CT head negative Ammonia WNL TSH 8  Septic shock, unknown etiology Adrenal Insufficiency ?Cardiogenic shock Wean vasopressors for MAP goal >65 Currently on vasopressin and levophed Continue stress dose steroids PRN fluid boluses Continue broad spectrum antibiotics with cefepime, flagyl and azithromycin De-escalate pending culture data Lactic acid has cleared  ST Elevations on EKG - troponins 71, 68 - Not a candidate for coronary intervention given recent GI bleeding - Continue to monitor - check limited echo for wall motion abnormalities  Acute blood loss anemia, no evidence of bleeding Trend CBC Transfused 1 unit 1/21  Acute kidney  failure Right Hydroureter Cr 6.93, on admission. Baseline 0.99 - 1.2  Monitor UOP/Cr Start bicarb drip at 77mL/hr for 10 hrs Avoid nephrotoxic  agents Discussed case with Urology. Recommended IR place perc nephrostomy tubes but IR not able due as there is no dilation of the kidneys. Will hold off on having stents placed as unsure this is the source of infection  COPD with emphysema Chronic hypoxemic respiratory failure on 2L at home Wean supplemental O2 for goal >88%  Severe Protein Calorie Malnutrition Hypoalbuminemia - core track ordered for initiation of TF.   Guarded prognosis. High likelihood for in-hospital death. Family confirmed full code status in the ED. Consult Palliative care  Best Practice (right click and "Reselect all SmartList Selections" daily)   Diet/type: NPO DVT prophylaxis: other Hold for now GI prophylaxis: N/A Lines: N/A Foley:  N/A Code Status:  full code Last date of multidisciplinary goals of care discussion [1/21] Confirmed by ED  Labs   CBC: Recent Labs  Lab 07/30/2022 1518 08/04/22 0148 08/04/22 1636 07/15/2022 0010  WBC 3.8* 6.0 6.5 6.1  NEUTROABS 3.2  --   --   --   HGB 6.5* 7.7* 7.0* 7.5*  HCT 20.4* 22.8* 21.3* 23.3*  MCV 75.6* 76.3* 77.2* 77.4*  PLT 170 170 127* 125*    Basic Metabolic Panel: Recent Labs  Lab 08/11/2022 1432 07/16/2022 2240 07/22/2022 0256  NA 145 142 141  K 4.9 4.6 5.0  CL 107 108 110  CO2 14* 18* 19*  GLUCOSE 154* 108* 87  BUN 63* 55* 56*  CREATININE 6.93* 5.88* 5.86*  CALCIUM 7.5* 7.4* 7.6*   GFR: Estimated Creatinine Clearance: 9 mL/min (A) (by C-G formula based on SCr of 5.86 mg/dL (H)). Recent Labs  Lab 07/30/2022 1432 07/15/2022 1518 07/14/2022 1823 07/17/2022 2240 08/04/22 0148 08/04/22 1636 07/31/2022 0010  WBC  --  3.8*  --   --  6.0 6.5 6.1  LATICACIDVEN 2.3*  --  1.5 1.2  --   --   --     Liver Function Tests: Recent Labs  Lab 08/13/2022 1432 07/18/2022 0256  AST 26 22  ALT 12 12  ALKPHOS 45 61  BILITOT 0.8 0.8  PROT 5.4* 5.4*  ALBUMIN 1.5* 2.4*   No results for input(s): "LIPASE", "AMYLASE" in the last 168 hours. Recent Labs  Lab  07/18/2022 1900  AMMONIA 19    ABG    Component Value Date/Time   PHART 7.45 08/03/2020 1613   PCO2ART 34 08/03/2020 1613   PO2ART 82 (L) 08/03/2020 1613   HCO3 21.5 07/15/2022 2237   ACIDBASEDEF 3.8 (H) 07/21/2022 2237   O2SAT 79.6 08/02/2022 2237     Coagulation Profile: Recent Labs  Lab 08/06/2022 2240  INR 1.6*    Cardiac Enzymes: No results for input(s): "CKTOTAL", "CKMB", "CKMBINDEX", "TROPONINI" in the last 168 hours.  HbA1C: Hgb A1c MFr Bld  Date/Time Value Ref Range Status  08/03/2020 04:13 PM 5.3 4.8 - 5.6 % Final    Comment:    (NOTE) Pre diabetes:          5.7%-6.4%  Diabetes:              >6.4%  Glycemic control for   <7.0% adults with diabetes   12/24/2017 04:56 AM 5.5 4.8 - 5.6 % Final    Comment:    (NOTE) Pre diabetes:          5.7%-6.4% Diabetes:              >  6.4% Glycemic control for   <7.0% adults with diabetes     CBG: Recent Labs  Lab 08/04/22 1512 08/04/22 1931 08/04/22 2337 08/11/2022 0328 08/03/2022 0714  GLUCAP 81 76 76 79 85     Critical care time: 45 min     The patient is critically ill with multiple organ systems failure and requires high complexity decision making for assessment and support, frequent evaluation and titration of therapies, application of advanced monitoring technologies and extensive interpretation of multiple databases.  Independent Critical Care Time: 80 Minutes.   Melody Comas, MD [facility] Office: [phone]   See Amion for personal pager PCCM on call pager [phone] until 7pm. Please call Elink 7p-7a. [phone]

## 2022-08-05 NOTE — Progress Notes (Signed)
eLink Physician-Brief Progress Note Patient Name: Carl Bell DOB: 02-24-1951 MRN: [id number]   Date of Service  08/01/2022  HPI/Events of Note  Persistent shock  On high dose levo and vaso  eICU Interventions  Add stress dose steroids     Intervention Category Intermediate Interventions: Hypotension - evaluation and management  Daelynn Blower Mechele Collin 07/20/2022, 12:06 AM

## 2022-08-05 NOTE — Consult Note (Addendum)
Consultation Note Date: 07/27/2022   Patient Name: Carl Bell  DOB: [birth year]  MRN: [id number]  Age / Sex: 72 y.o., male  PCP: Lauro Regulus, MD Referring Physician: Martina Sinner, MD  Reason for Consultation:  goals of care  HPI/Patient Profile: 72 y.o. male  with past medical history of cirrhosis of the liver, legally blind, COPD, chronic hypoxemic respiratory failure on 2L at home, history of lung cancer- s/p chemo and radiation therapy, last seen by Dr. Orlie Dakin 11/2021 at which time no further treatment and no further followup was recommended- Dr. Milinda Cave note also discussed he had declining performance status at that time and would not be candidate for any treatment if he had a recurrence, admitted 12/10-1/5 for volvulus and underwent sigmoid resection and placement of colostomy- of note he was receiving hospice services through [facility] prior to the 12/10 admission- but this was rescinded for admission and per note by Haynes Bast on 12/10 patient was not aware he was receiving hospice care "he thought Medicare staff were visiting.". He was discharged on 1/5 to [facility], however, returned to ED on 1/5 due to being combative and dislodging his foley catheter. He was held in the ED from 1/5-1/17 and there is documentation of intermittent confusion, visual and auditory hallucinations and poor po intake during his time in the ED.  OT evaluated and determined to have significant impairments in short term memory, recall, orientation, and working memory on 1/17. He was discharged from ED to [facility] on 1/17. He is now readmitted on 07/16/2022 with altered mental status. He was hypoglycemic with CBG 46. Acute renal failure with BUN/Cr 63/6.93; hypotensive and hypothermic. Started on sepsis protocol. Etiology of sepsis at this point is unknown. Albumin on admission was 1.5. AST/ALT WNL.Ammonia was  not elevated. Blood cultures show no growth to date (x 2 days). Urine culture resulted with many species and recollection was recommended. He continues to be hypotensive and is currently on norepi 28mcg/hr and vasopressin at 0.03units/min. Palliative medicine consulted for goals of care.     Primary Decision Maker NEXT OF KIN - son Misty Stanley, son Viviann Spare, and daughter  Discussion: Chart reviewed including labs, progress notes, imaging from this and previous encounters.  Evaluated patient. Eyes open, he had positive nystagmus, did not track (although legally blind). Did not answer verbally, and did not follow commands.  He appears very ill, frail and significantly cachetic with diffuse muscle wasting.  Per note his [name] is HCPOA- however, there are no documents present. Spoke with son, Misty Stanley. Misty Stanley says that he and his siblings are decision makers for patient. There is not an HCPOA document.  I tried to also call Judie Grieve but was unable to reach him.  I reviewed the seriousness of "[name]'s" illness with Misty Stanley and the need for GOC discussion. I shared my concern that [name] has the look of someone who is dying. Misty Stanley shared that patient is much worse than  he was a few days ago- when he saw him previously patient  was calling his friends, eating, and interacting.  Misty Stanley would like to meet tomorrow with his brother and sister present for further GOC discussion.      SUMMARY OF RECOMMENDATIONS -Continue current plan -Meeting scheduled tomorrow at approx 1:30- Misty Stanley to call if time needs to be changed -Misty Stanley and his brother and sister are legal decision makers unless patient's brother is able to produce an HCPOA document    Code Status/Advance Care Planning: Full code   Prognosis:   Unable to determine  Discharge Planning: To Be Determined  Primary Diagnoses: Present on Admission:  Sepsis (HCC)   Review of Systems  Unable to perform ROS: Mental status change    Physical  Exam Vitals and nursing note reviewed.  Constitutional:      General: He is awake.     Appearance: He is cachectic. He is ill-appearing and toxic-appearing.  Neurological:     Comments: Blind, nonverbal     Vital Signs: BP (!) 89/67   Pulse 86   Temp 97.9 F (36.6 C)   Resp 19   Wt 56.9 kg   SpO2 100%   BMI 16.55 kg/m  Pain Scale: PAINAD       SpO2: SpO2: 100 % O2 Device:SpO2: 100 % O2 Flow Rate: .O2 Flow Rate (L/min): 2 L/min  IO: Intake/output summary:  Intake/Output Summary (Last 24 hours) at 07/27/2022 1448 Last data filed at 08/06/2022 1200 Gross per 24 hour  Intake 1059.69 ml  Output 15 ml  Net 1044.69 ml    LBM: Last BM Date : 07/28/2022 Baseline Weight: Weight: 53.3 kg Most recent weight: Weight: 56.9 kg       Thank you for this consult. Palliative medicine will continue to follow and assist as needed.  Time Total: 80 minutes Greater than 50%  of this time was spent counseling and coordinating care related to the above assessment and plan.  Signed by: Ocie Bob, AGNP-C Palliative Medicine    Please contact Palliative Medicine Team phone at [phone] for questions and concerns.  For individual provider: See Loretha Stapler

## 2022-08-05 NOTE — TOC Progression Note (Signed)
Transition of Care [facility]) - Initial/Assessment Note    Patient Details  Name: Carl Bell MRN: [id number] Date of Birth: [birth year]  [facility]) CM/SW Contact:    Ralene Bathe, LCSWA Phone Number: 07/24/2022, 2:48 PM  Clinical Narrative:                 LCSW contacted Irving Burton with admissions at [facility] and was informed that the patient can return when medically ready.    TOC following.   Cleon Gustin, MSW, LCSW  Patient Goals and CMS Choice            Expected Discharge Plan and Services                                              Prior Living Arrangements/Services                       Activities of Daily Living      Permission Sought/Granted                  Emotional Assessment              Admission diagnosis:  Sepsis (HCC) [A41.9] Sepsis with acute renal failure, due to unspecified organism, unspecified acute renal failure type, unspecified whether septic shock present (HCC) [A41.9, R65.20, N17.9] Patient Active Problem List   Diagnosis Date Noted   Nystagmus 07/17/2022   Urinary retention due to benign prostatic hyperplasia 07/12/2022   Cirrhosis of liver with ascites (HCC) 07/12/2022   Anasarca associated with disorder of kidney 07/10/2022   Acute renal failure due to urinary obstruction (HCC) 07/10/2022   Sigmoid volvulus (HCC) 06/28/2022   Acute postoperative anemia due to expected blood loss 06/24/2022   Hyperkalemia 06/24/2022   Volvulus of intestine (HCC) 06/22/2022   Hypokalemia 06/22/2022   Aspiration pneumonia (HCC) 09/14/2021   COVID 08/04/2020   Demand ischemia 08/04/2020   SOB (shortness of breath) 08/04/2020   Primary open angle glaucoma (POAG) of both eyes, severe stage 05/13/2020   Legally blind 05/13/2020   COPD exacerbation (HCC) 11/08/2019   Acute exacerbation of chronic obstructive pulmonary disease (COPD) (HCC) 10/18/2019   Hypertensive urgency 10/17/2019   COPD with acute  exacerbation (HCC) 09/12/2019   Acute on chronic respiratory failure with hypoxia and hypercapnia (HCC) 09/12/2019   Tobacco abuse 09/12/2019   PAD (peripheral artery disease) (HCC) 09/12/2019   HLD (hyperlipidemia) 09/12/2019   Atherosclerosis of native arteries of the extremities with ulceration (HCC) 06/28/2019   Sepsis (HCC) 12/24/2017   Protein-calorie malnutrition, severe 11/12/2017   Acute respiratory failure (HCC) 11/10/2017   Acute respiratory distress 12/22/2015   COPD (chronic obstructive pulmonary disease) (HCC) 12/22/2015   Acute bronchitis 12/22/2015   Hypertension 12/22/2015   Lung cancer, lingula (HCC) 09/23/2015   Chronic edema 09/15/2014   PCP:  Lauro Regulus, MD Pharmacy:   [facility] - [place], [place] - [address] [place] [address] Phone: [phone] Fax: [phone]  [facility] - [place], [place] - [address] [place] [address] Phone: [phone] Fax: [phone]     Social Determinants of Health (SDOH) Social History: SDOH Screenings   Food Insecurity: No Food Insecurity (06/30/2022)  Housing: Low Risk  (06/30/2022)  Transportation Needs: No Transportation Needs (06/30/2022)  Utilities: Not  At Risk (06/30/2022)  Tobacco Use: High Risk (08/07/2022)   SDOH Interventions:     Readmission Risk Interventions    06/24/2022   11:08 AM  Readmission Risk Prevention Plan  Transportation Screening Complete  PCP or Specialist Appt within 3-5 Days Complete  Social Work Consult for Recovery Care Planning/Counseling Complete  Palliative Care Screening Not Applicable  Medication Review Oceanographer) Complete

## 2022-08-06 ENCOUNTER — Ambulatory Visit: Payer: Self-pay | Admitting: Urology

## 2022-08-06 ENCOUNTER — Inpatient Hospital Stay (HOSPITAL_COMMUNITY): Payer: Medicare HMO

## 2022-08-06 ENCOUNTER — Encounter: Payer: Self-pay | Admitting: Urology

## 2022-08-06 DIAGNOSIS — D649 Anemia, unspecified: Secondary | ICD-10-CM | POA: Diagnosis present

## 2022-08-06 DIAGNOSIS — N139 Obstructive and reflux uropathy, unspecified: Secondary | ICD-10-CM

## 2022-08-06 DIAGNOSIS — R9431 Abnormal electrocardiogram [ECG] [EKG]: Secondary | ICD-10-CM | POA: Insufficient documentation

## 2022-08-06 DIAGNOSIS — H548 Legal blindness, as defined in USA: Secondary | ICD-10-CM

## 2022-08-06 DIAGNOSIS — L8990 Pressure ulcer of unspecified site, unspecified stage: Secondary | ICD-10-CM | POA: Insufficient documentation

## 2022-08-06 DIAGNOSIS — G9341 Metabolic encephalopathy: Secondary | ICD-10-CM | POA: Diagnosis present

## 2022-08-06 DIAGNOSIS — J9611 Chronic respiratory failure with hypoxia: Secondary | ICD-10-CM | POA: Insufficient documentation

## 2022-08-06 DIAGNOSIS — I2489 Other forms of acute ischemic heart disease: Secondary | ICD-10-CM

## 2022-08-06 DIAGNOSIS — A419 Sepsis, unspecified organism: Secondary | ICD-10-CM | POA: Insufficient documentation

## 2022-08-06 DIAGNOSIS — R6521 Severe sepsis with septic shock: Secondary | ICD-10-CM | POA: Diagnosis not present

## 2022-08-06 DIAGNOSIS — E2740 Unspecified adrenocortical insufficiency: Secondary | ICD-10-CM | POA: Diagnosis present

## 2022-08-06 DIAGNOSIS — N179 Acute kidney failure, unspecified: Secondary | ICD-10-CM | POA: Diagnosis not present

## 2022-08-06 DIAGNOSIS — T68XXXA Hypothermia, initial encounter: Secondary | ICD-10-CM | POA: Insufficient documentation

## 2022-08-06 DIAGNOSIS — Z7189 Other specified counseling: Secondary | ICD-10-CM | POA: Diagnosis not present

## 2022-08-06 DIAGNOSIS — K7031 Alcoholic cirrhosis of liver with ascites: Secondary | ICD-10-CM

## 2022-08-06 LAB — GLUCOSE, CAPILLARY
Glucose-Capillary: 122 mg/dL — ABNORMAL HIGH (ref 70–99)
Glucose-Capillary: 123 mg/dL — ABNORMAL HIGH (ref 70–99)
Glucose-Capillary: 117 mg/dL — ABNORMAL HIGH (ref 70–99)
Glucose-Capillary: 111 mg/dL — ABNORMAL HIGH (ref 70–99)
Glucose-Capillary: 131 mg/dL — ABNORMAL HIGH (ref 70–99)
Glucose-Capillary: 149 mg/dL — ABNORMAL HIGH (ref 70–99)

## 2022-08-06 LAB — ECHOCARDIOGRAM LIMITED
Area-P 1/2: 4.39 cm2
S' Lateral: 3.1 cm
Single Plane A4C EF: 41.8 %
Weight: 2007.07 oz

## 2022-08-06 LAB — PHOSPHORUS: Phosphorus: 8.5 mg/dL — ABNORMAL HIGH (ref 2.5–4.6)

## 2022-08-06 LAB — MAGNESIUM: Magnesium: 1.5 mg/dL — ABNORMAL LOW (ref 1.7–2.4)

## 2022-08-06 MED ORDER — OSMOLITE 1.2 CAL PO LIQD
1000.0000 mL | ORAL | Status: DC
  Administered 2022-08-06: 1000 mL
  Filled 2022-08-06 (×2): qty 1000

## 2022-08-06 MED ORDER — THIAMINE MONONITRATE 100 MG PO TABS
100.0000 mg | ORAL_TABLET | Freq: Every day | ORAL | Status: DC
  Administered 2022-08-06 – 2022-08-07 (×2): 100 mg
  Filled 2022-08-06 (×3): qty 1

## 2022-08-06 NOTE — Progress Notes (Signed)
  Echocardiogram 2D Echocardiogram has been performed.  Carl Bell 08/06/2022, 11:32 AM

## 2022-08-06 NOTE — Progress Notes (Signed)
Daily Progress Note   Patient Name: Carl Bell       Date: 08/06/2022 DOB: 11/30/1950  Age: 72 y.o. MRN#: [id number] Attending Physician: Carl Starr, MD Primary Care Physician: Carl Ruths, MD Admit Date: 07/14/2022  Reason for Consultation/Follow-up: Establishing goals of care  Patient Profile/HPI:   72 y.o. male  with past medical history of cirrhosis of the liver, legally blind, COPD, chronic hypoxemic respiratory failure on 2L at home, history of lung cancer- s/p chemo and radiation therapy, last seen by Dr. Grayland Bell 11/2021 at which time no further treatment and no further followup was recommended- Dr. Gary Fleet note also discussed he had declining performance status at that time and would not be candidate for any treatment if he had a recurrence, admitted 12/10-1/5 for volvulus and underwent sigmoid resection and placement of colostomy- of note he was receiving hospice services through [facility] prior to the 12/10 admission- but this was rescinded for admission and per note by Carl Bell on 12/10 patient was not aware he was receiving hospice care "he thought Medicare staff were visiting.". He was discharged on 1/5 to [facility], however, returned to ED on 1/5 due to being combative and dislodging his foley catheter. He was held in the ED from 1/5-1/17 and there is documentation of intermittent confusion, visual and auditory hallucinations and poor po intake during his time in the ED.  OT evaluated and determined to have significant impairments in short term memory, recall, orientation, and working memory on 1/17. He was discharged from ED to [facility] on 1/17. He is now readmitted on 07/21/2022 with altered mental status. He was hypoglycemic with CBG 46. Acute renal failure  with BUN/Cr 63/6.93; hypotensive and hypothermic. Started on sepsis protocol. Etiology of sepsis at this point is unknown. Albumin on admission was 1.5. AST/ALT WNL.Ammonia was not elevated. Blood cultures show no growth to date (x 2 days). Urine culture resulted with many species and recollection was recommended. He continues to be hypotensive and is currently on norepi 39mcg/hr and vasopressin at 0.03units/min. Palliative medicine consulted for goals of care.      Subjective: I have reviewed medical records including EPIC notes, labs and imaging, received report from RN, assessed the patient and then met at the bedside along with patient's 3 children Carl Bell, and Carl Bell, daughter  in law Carl Bell, and patient's brother Carl Bell, to discuss diagnosis prognosis, GOC, EOL wishes, disposition and options.  I introduced Palliative Medicine as specialized medical care for people living with serious illness. It focuses on providing relief from the symptoms and stress of a serious illness. The goal is to improve quality of life for both the patient and the family.  We discussed a brief life review of the patient and then focused on their current illness. Carl Bell is known as funny family oriented person.  He can tell the best jokes.  Prior to this admission he was briefly living in a nursing facility- family feels that he was not properly fed there.   We discussed his current acute and chronic illnesses, including his sepsis possibly from UTI, encephalopathy, and severe malnutrition.   Advanced directives, concepts specific to code status, artifical feeding and hydration, and rehospitalization were considered and discussed.  Goals of care are to return patient to a state that he can be discharged home, even if this means that he is bedbound and will require around the clock care. [name] and [place] both share they are prepared to care for him in their home.   We discussed that if patient doesn't show  improvement over the next few days, or his condition worsens, then we may need to have further conversations.   They would like to proceed with a cortrak and possibly long term PEG if he does not start to eat and drink again.   Code status was discussed. Encouraged patient/family to consider DNR/DNI status understanding evidenced based poor outcomes in similar hospitalized patients, as the cause of the arrest is likely associated with chronic/terminal disease rather than a reversible acute cardio-pulmonary event. Family in agreement with DNR order.   Discussed the importance of continued conversation with family and the medical providers regarding overall plan of care and treatment options, ensuring decisions are within the context of the patient's values and GOCs.    Questions and concerns were addressed.  Hard Choices booklet left for review. The family was encouraged to call with questions or concerns.   Review of Systems  Unable to perform ROS: Mental status change     Physical Exam Vitals and nursing note reviewed.  Constitutional:      General: He is in acute distress.     Appearance: He is ill-appearing.     Comments: Severely cachectic  Cardiovascular:     Rate and Rhythm: Normal rate.  Pulmonary:     Effort: Pulmonary effort is normal.  Neurological:     Comments: nonverbal             Vital Signs: BP 105/72   Pulse 100   Temp 99.7 F (37.6 C)   Resp 18   Wt 56.9 kg   SpO2 100%   BMI 16.55 kg/m  SpO2: SpO2: 100 % O2 Device: O2 Device: Nasal Cannula O2 Flow Rate: O2 Flow Rate (L/min): 2 L/min  Intake/output summary:  Intake/Output Summary (Last 24 hours) at 08/06/2022 1452 Last data filed at 08/06/2022 1100 Gross per 24 hour  Intake 923.71 ml  Output 605 ml  Net 318.71 ml   LBM: Last BM Date : 08/01/2022 Baseline Weight: Weight: 53.3 kg Most recent weight: Weight: 56.9 kg       Palliative Assessment/Data: PPS: 10%      Patient Active Problem List    Diagnosis Date Noted   Septic shock ([place]) 08/06/2022   Pressure injury of skin 08/06/2022   Acute  metabolic encephalopathy 82/64/1583   Hypothermia 08/06/2022   Adrenal insufficiency ([place]) 08/06/2022   Abnormal ECG 08/06/2022   Symptomatic anemia 08/06/2022   Sepsis due to urinary tract infection ([place]) 08/06/2022   Chronic respiratory failure with hypoxia ([place]) 08/06/2022   Nystagmus 07/17/2022   Urinary retention due to benign prostatic hyperplasia 07/12/2022   Cirrhosis of liver with ascites ([place]) 07/12/2022   Anasarca associated with disorder of kidney 07/10/2022   Acute renal failure due to urinary obstruction ([place]) 07/10/2022   Sigmoid volvulus ([place]) 06/28/2022   Acute postoperative anemia due to expected blood loss 06/24/2022   Hyperkalemia 06/24/2022   Volvulus of intestine ([place]) 06/22/2022   Hypokalemia 06/22/2022   Aspiration pneumonia ([place]) 09/14/2021   COVID 08/04/2020   Demand ischemia 08/04/2020   SOB (shortness of breath) 08/04/2020   Primary open angle glaucoma (POAG) of both eyes, severe stage 05/13/2020   Legally blind 05/13/2020   COPD exacerbation ([place]) 11/08/2019   Acute exacerbation of chronic obstructive pulmonary disease (COPD) ([place]) 10/18/2019   Hypertensive urgency 10/17/2019   COPD with acute exacerbation ([place]) 09/12/2019   Acute on chronic respiratory failure with hypoxia and hypercapnia ([place]) 09/12/2019   Tobacco abuse 09/12/2019   PAD (peripheral artery disease) ([place]) 09/12/2019   HLD (hyperlipidemia) 09/12/2019   Atherosclerosis of native arteries of the extremities with ulceration ([place]) 06/28/2019   Sepsis ([place]) 12/24/2017   Protein-calorie malnutrition, severe 11/12/2017   Acute respiratory failure ([place]) 11/10/2017   Acute respiratory distress 12/22/2015   COPD (chronic obstructive pulmonary disease) ([place]) 12/22/2015   Acute bronchitis 12/22/2015   Hypertension 12/22/2015   Lung cancer, lingula ([place]) 09/23/2015   Chronic edema 09/15/2014     Palliative Care Assessment & Plan    Assessment/Recommendations/Plan  DNR- full scope interventions Cortrak today GOC is for patient to return to point where he can discharge to patient's son- [name]'s home- they are able to provide 24hr care PMT will continue to follow   Code Status: DNR  Prognosis:  Unable to determine  Discharge Planning: To Be Determined  Care plan was discussed with patient's family.   Thank you for allowing the Palliative Medicine Team to assist in the care of this patient.  Total time: 120 minutes  Greater than 50%  of this time was spent counseling and coordinating care related to the above assessment and plan.  Mariana Kaufman, AGNP-C Palliative Medicine   Please contact Palliative Medicine Team phone at [phone] for questions and concerns.

## 2022-08-06 NOTE — Procedures (Signed)
Cortrak  Tube Type:  Cortrak - 43 inches Tube Location:  Left nare Secured by: Bridle Technique Used to Measure Tube Placement:  Marking at nare/corner of mouth Cortrak Secured At:  70 cm   Cortrak Tube Team Note:  Consult received to place a Cortrak feeding tube.   X-ray is required, abdominal x-ray has been ordered by the Cortrak team. Please confirm tube placement before using the Cortrak tube.   If the tube becomes dislodged please keep the tube and contact the Cortrak team at [email or web address] for replacement.  If after hours and replacement cannot be delayed, place a NG tube and confirm placement with an abdominal x-ray.    Drexel Ivey MS, RD, LDN Please refer to AMION for RD and/or RD on-call/weekend/after hours pager   

## 2022-08-06 NOTE — Progress Notes (Addendum)
NAME:  Carl Bell, MRN:  [id number], DOB:  Jul 23, 1950, LOS: 3 ADMISSION DATE:  07/15/2022, CONSULTATION DATE:  08/10/2022 REFERRING MD:  Isla Pence, MD, CHIEF COMPLAINT:  Septic shock   History of Present Illness:  72 year old male [facility] resident who presents for altered mental status. Blood glucose 46 however unable to take PO due to mental status. In the ED given D50. Found profoundly hypotensive and hypothermic with rectal temp 90. Given IVF,  Vanc/Cefepime/Flagyl. CXR right basilar infiltrate/atelectasis with small right pleural effusion. Started on levophed. Currently on 4 mcg/min. Pertinent labs include WBC 3.8 Hg 6.5. LA 2.3. PCCM consulted for admission.  Of note, he had recent hospitalization (06/22/22-07/18/22) for sigmoid volvulus s/p decompression colonoscopy with subsequent Hartman's procedure. Also had ascites requiring paracentesis that resolved after this. He was previously hospice before this however rescinded for the above interventions. He was discharged to [facility] and reportedly combative with dislodged foley. Unfortunately [facility] did not accept patient back and he remained in the ED as a boarder from 1/5-1/17 until he was accepted at [facility]. During his boarded time he was treated for pseudomonas UTI with Cipro  In the ED patient not interactive but protecting airway. On levophed 15 and hypotensive  Pertinent  Medical History  COPD with emphysema, chronic hypoxemic respiratory failure on 2L at home, hx lung cancer, HTN, chronic vision loss  Significant Hospital Events: Including procedures, antibiotic start and stop dates in addition to other pertinent events   12/10-1/5 Hospital admission for sigmoid volvulus 1/5-1/17 ED boarder due to placement issues 1/21 Admit to PCCM after presenting with AMS, started on vasopressors 1/22 stress dose steroids started due to being on vaso and levophed 1/24 - GOC today  Interim History / Subjective:  Remains AMS, on Levo 5,  vaso. Weaning, GOC today   Objective   Blood pressure (!) 79/63, pulse 96, temperature (!) 95.9 F (35.5 C), resp. rate (!) 22, weight 56.9 kg, SpO2 100 %.        Intake/Output Summary (Last 24 hours) at 08/06/2022 0859 Last data filed at 08/06/2022 0600 Gross per 24 hour  Intake 1020.97 ml  Output 305 ml  Net 715.97 ml   Filed Weights   07/30/2022 2152 07/31/2022 0327  Weight: 53.3 kg 56.9 kg   Physical Examination: General: Chronically ill-appearing elderly male in NAD. cachectic HEENT: [place]/AT, anicteric sclera, PERRL, moist mucous membranes. Neuro: Awake but not oriented Does not respond to verbal commands  CV: RRR, no m/g/r. PULM: Breathing even and unlabored on 2L [place]. Lung fields clear. GI: Soft, nontender, nondistended. Normoactive bowel sounds. Foley Cath Extremities: trace LE edema noted. Skin: Warm/dry, Right [facility]  Resolved Hospital Problem list   N/A  Assessment & Plan:   Acute metabolic encephalopathy In setting of sepsis CT head negative Ammonia WNL TSH 8 Plan - Cont to monitor neuro status  - Supportive care   - GOC today   Septic shock, unknown etiology Adrenal Insufficiency ?Cardiogenic shock Plan - On NE and Vaso - Wean vasopressors for MAP >65 - Continue stress dose steroids - PRN fluid boluses - Cont Cefepime Stop 1/28, Azithromycin Stop 1/27 > can de-escalate when cx result - Blood Cx pending   ST Elevations on EKG - troponins 71, 68 - Not a candidate for coronary intervention given recent GI bleeding Plan  - Continue to monitor  - Awaiting ECHO to be completed   Acute blood loss anemia, no evidence of bleeding Transfused 1 unit 1/21 -  Trend H&H - Monitor for signs of active bleeding - Transfuse for Hgb < 7.0 or hemodynamically significant bleeding  Acute kidney failure Right Hydroureter Cr 6.93, on admission. Baseline 0.99 - 1.2  >> Discussed case with Urology. Recommended IR place perc nephrostomy tubes but IR not able due as there  is no dilation of the kidneys. Will hold off on having stents placed as unsure this is the source of infection Plan  - Trend BMP - Monitor I&Os - Avoid nephrotoxic agents as able - Ensure adequate renal perfusion  COPD with emphysema Chronic hypoxemic respiratory failure on 2L at home Plan  - Wean supplemental O2 for goal >88%  Severe Protein Calorie Malnutrition Hypoalbuminemia - core track ordered for initiation of TF.   Best Practice (right click and "Reselect all SmartList Selections" daily)   Diet/type: NPO DVT prophylaxis: other Hold for now GI prophylaxis: N/A Lines: Port Foley:  Foley Catheter  Code Status:  full code Last date of multidisciplinary goals of care discussion [1/21] Confirmed by ED - Palliative consulted and seen on 1/23, plan of [place] discussion today at 1330.  - Pt has three children  Labs   CBC: Recent Labs  Lab 07/24/2022 1518 08/04/22 0148 08/04/22 1636 07/28/2022 0010 08/11/2022 1112  WBC 3.8* 6.0 6.5 6.1  --   NEUTROABS 3.2  --   --   --   --   HGB 6.5* 7.7* 7.0* 7.5* 7.8*  HCT 20.4* 22.8* 21.3* 23.3* 23.0*  MCV 75.6* 76.3* 77.2* 77.4*  --   PLT 170 170 127* 125*  --     Basic Metabolic Panel: Recent Labs  Lab 07/21/2022 1432 08/02/2022 2240 07/21/2022 0256 08/11/2022 1112  NA 145 142 141 144  K 4.9 4.6 5.0 5.1  CL 107 108 110  --   CO2 14* 18* 19*  --   GLUCOSE 154* 108* 87  --   BUN 63* 55* 56*  --   CREATININE 6.93* 5.88* 5.86*  --   CALCIUM 7.5* 7.4* 7.6*  --    GFR: Estimated Creatinine Clearance: 9 mL/min (A) (by C-G formula based on SCr of 5.86 mg/dL (H)). Recent Labs  Lab 08/09/2022 1432 08/13/2022 1518 08/06/2022 1823 07/26/2022 2240 08/04/22 0148 08/04/22 1636 08/02/2022 0010  WBC  --  3.8*  --   --  6.0 6.5 6.1  LATICACIDVEN 2.3*  --  1.5 1.2  --   --   --     Liver Function Tests: Recent Labs  Lab 08/07/2022 1432 07/15/2022 0256  AST 26 22  ALT 12 12  ALKPHOS 45 61  BILITOT 0.8 0.8  PROT 5.4* 5.4*  ALBUMIN 1.5* 2.4*    No results for input(s): "LIPASE", "AMYLASE" in the last 168 hours. Recent Labs  Lab 07/16/2022 1900  AMMONIA 19    ABG    Component Value Date/Time   PHART 7.240 (L) 07/30/2022 1112   PCO2ART 36.1 07/20/2022 1112   PO2ART 187 (H) 07/15/2022 1112   HCO3 15.5 (L) 07/23/2022 1112   TCO2 17 (L) 07/28/2022 1112   ACIDBASEDEF 11.0 (H) 07/18/2022 1112   O2SAT 99 07/29/2022 1112     Coagulation Profile: Recent Labs  Lab 07/17/2022 2240  INR 1.6*    Cardiac Enzymes: No results for input(s): "CKTOTAL", "CKMB", "CKMBINDEX", "TROPONINI" in the last 168 hours.  HbA1C: Hgb A1c MFr Bld  Date/Time Value Ref Range Status  08/03/2020 04:13 PM 5.3 4.8 - 5.6 % Final    Comment:    (NOTE)  Pre diabetes:          5.7%-6.4%  Diabetes:              >6.4%  Glycemic control for   <7.0% adults with diabetes   12/24/2017 04:56 AM 5.5 4.8 - 5.6 % Final    Comment:    (NOTE) Pre diabetes:          5.7%-6.4% Diabetes:              >6.4% Glycemic control for   <7.0% adults with diabetes     CBG: Recent Labs  Lab 08/02/2022 1111 08/04/2022 1536 07/27/2022 2324 08/06/22 0333 08/06/22 0717  GLUCAP 85 93 111* 122* 123*     Critical care time:     Erma Heritage, NP-S Cct deferred to Attending

## 2022-08-06 NOTE — Progress Notes (Signed)
Initial Nutrition Assessment  DOCUMENTATION CODES:   Underweight, Severe malnutrition in context of chronic illness  INTERVENTION:   Initiate tube feeds via Cortrak tube once gastric placement confirmed via x-ray: - Start Osmolite 1.2 @ 20 ml/hr and advance rate by 10 ml q 12 hours to goal rate of 65 ml/hr (1560 ml/day)  Tube feeding regimen at goal rate provides 1872 kcal, 87 grams of protein, and 1279 ml of H2O.   Monitor magnesium, potassium, and phosphorus q 12 hours for at least 6 occurrences, MD to replete as needed, as pt is at high risk for refeeding syndrome given severe malnutrition.  - Thiamine 100 mg daily x 5 days per tube due to refeeding risk  NUTRITION DIAGNOSIS:   Severe Malnutrition related to chronic illness (cirrhosis, COPD, lung cancer) as evidenced by severe fat depletion, severe muscle depletion, percent weight loss (21.6% weight loss in < 1 year).  GOAL:   Patient will meet greater than or equal to 90% of their needs  MONITOR:   Diet advancement, Labs, Weight trends, TF tolerance, Skin, I & O's  REASON FOR ASSESSMENT:   Consult Enteral/tube feeding initiation and management  ASSESSMENT:   72 year old male who presented to the ED on 1/21 with AMS and hypoglycemia. PMH of recent admission for sigmoid volvulus s/p decompression colonoscopy with subsequent Hartmann's procedure, cirrhosis of the liver, ascites, COPD with emphysema, chronic respiratory failure on 2 L oxygen, lung cancer s/p chemo and radiation therapy, HTN, legal blindness. Pt admitted with acute metabolic encephalopathy, shock, AKI.  01/24 - Cortrak placed (x-ray pending)  Discussed pt with RN and during ICU rounds. Consult received for enteral nutrition initiation and management. Family meeting held today with Palliative NP and plan is for DNR and full scope interventions. GOC is for pt to return to a point where he can discharge to patient's son's home.  Pt is severely malnourished with  no PO intake this admission (x 3 days). Pt at extremely high refeeding risk.  Reviewed weight history in chart. Pt with a 15.7 kg weight loss since 09/13/21. This is a 21.6% weight loss in less than 1 year which is severe and significant for timeframe.  Admit weight: 53.3 kg Current weight: 56.9 kg  Medications reviewed and include: IV solu-cortef, IV abx Drips: levophed @ 5 mcg/min, vasopressin @ 0.03 units/min  Labs reviewed: BUN 56, creatinine 5.86, hemoglobin 7.8, platelets 125 CBG's: 93-123 x 24 hours  UOP: 55 ml x 24 hours Colostomy: 250 ml x 24 hours I/O's: +5.0 L since admit  NUTRITION - FOCUSED PHYSICAL EXAM:  Flowsheet Row Most Recent Value  Orbital Region Severe depletion  Upper Arm Region Severe depletion  Thoracic and Lumbar Region Severe depletion  Buccal Region Severe depletion  Temple Region Severe depletion  Clavicle Bone Region Severe depletion  Clavicle and Acromion Bone Region Severe depletion  Scapular Bone Region Severe depletion  Dorsal Hand Severe depletion  Patellar Region Severe depletion  Anterior Thigh Region Severe depletion  Posterior Calf Region Severe depletion  Edema (RD Assessment) None  Hair Reviewed  Eyes Reviewed  Mouth Reviewed  Skin Reviewed  Nails Reviewed       Diet Order:   Diet Order             Diet NPO time specified  Diet effective now                   EDUCATION NEEDS:   Not appropriate for education at this time  Skin:  Skin Assessment: Skin Integrity Issues: DTI: bilateral buttocks, R foot, L heel Stage II: L hip, coccyx Other: MARSI to sacrum  Last BM:  08/06/22 colostomy  Height:   Ht Readings from Last 1 Encounters:  07/20/22 6\' 1"  (1.854 m)    Weight:   Wt Readings from Last 1 Encounters:  07/26/2022 56.9 kg    Ideal Body Weight:  83.6 kg  BMI:  Body mass index is 16.55 kg/m.  Estimated Nutritional Needs:   Kcal:  1700-1900  Protein:  80-95 grams  Fluid:  1.7 L/day    Gustavus Bryant, MS, RD, LDN Inpatient Clinical Dietitian Please see AMiON for contact information.

## 2022-08-07 ENCOUNTER — Inpatient Hospital Stay (HOSPITAL_COMMUNITY): Payer: Medicare HMO

## 2022-08-07 DIAGNOSIS — A419 Sepsis, unspecified organism: Secondary | ICD-10-CM | POA: Diagnosis not present

## 2022-08-07 DIAGNOSIS — R6521 Severe sepsis with septic shock: Secondary | ICD-10-CM | POA: Diagnosis not present

## 2022-08-07 LAB — CBC
HCT: 21.3 % — ABNORMAL LOW (ref 39.0–52.0)
Hemoglobin: 7.2 g/dL — ABNORMAL LOW (ref 13.0–17.0)
MCH: 25.1 pg — ABNORMAL LOW (ref 26.0–34.0)
MCHC: 33.8 g/dL (ref 30.0–36.0)
MCV: 74.2 fL — ABNORMAL LOW (ref 80.0–100.0)
Platelets: 76 10*3/uL — ABNORMAL LOW (ref 150–400)
RBC: 2.87 MIL/uL — ABNORMAL LOW (ref 4.22–5.81)
RDW: 18.6 % — ABNORMAL HIGH (ref 11.5–15.5)
WBC: 10.7 10*3/uL — ABNORMAL HIGH (ref 4.0–10.5)
nRBC: 3.3 % — ABNORMAL HIGH (ref 0.0–0.2)

## 2022-08-07 LAB — BASIC METABOLIC PANEL
Anion gap: 15 (ref 5–15)
BUN: 75 mg/dL — ABNORMAL HIGH (ref 8–23)
CO2: 20 mmol/L — ABNORMAL LOW (ref 22–32)
Calcium: 7.7 mg/dL — ABNORMAL LOW (ref 8.9–10.3)
Chloride: 108 mmol/L (ref 98–111)
Creatinine, Ser: 5.6 mg/dL — ABNORMAL HIGH (ref 0.61–1.24)
GFR, Estimated: 10 mL/min — ABNORMAL LOW (ref >60)
Glucose, Bld: 233 mg/dL — ABNORMAL HIGH (ref 70–99)
Potassium: 5.4 mmol/L — ABNORMAL HIGH (ref 3.5–5.1)
Sodium: 143 mmol/L (ref 135–145)

## 2022-08-07 LAB — GLUCOSE, CAPILLARY
Glucose-Capillary: 147 mg/dL — ABNORMAL HIGH (ref 70–99)
Glucose-Capillary: 202 mg/dL — ABNORMAL HIGH (ref 70–99)
Glucose-Capillary: 201 mg/dL — ABNORMAL HIGH (ref 70–99)
Glucose-Capillary: 154 mg/dL — ABNORMAL HIGH (ref 70–99)
Glucose-Capillary: 129 mg/dL — ABNORMAL HIGH (ref 70–99)
Glucose-Capillary: 134 mg/dL — ABNORMAL HIGH (ref 70–99)

## 2022-08-07 LAB — PHOSPHORUS
Phosphorus: 8.4 mg/dL — ABNORMAL HIGH (ref 2.5–4.6)
Phosphorus: 8.6 mg/dL — ABNORMAL HIGH (ref 2.5–4.6)

## 2022-08-07 LAB — MAGNESIUM
Magnesium: 1.5 mg/dL — ABNORMAL LOW (ref 1.7–2.4)
Magnesium: 1.7 mg/dL (ref 1.7–2.4)

## 2022-08-07 MED ORDER — INSULIN ASPART 100 UNIT/ML IJ SOLN
0.0000 [IU] | INTRAMUSCULAR | Status: DC
  Administered 2022-08-07: 3 [IU] via SUBCUTANEOUS
  Administered 2022-08-07: 2 [IU] via SUBCUTANEOUS
  Administered 2022-08-07 – 2022-08-08 (×3): 1 [IU] via SUBCUTANEOUS

## 2022-08-07 MED ORDER — MORPHINE SULFATE (PF) 2 MG/ML IV SOLN
1.0000 mg | INTRAVENOUS | Status: DC | PRN
  Administered 2022-08-07 – 2022-08-08 (×3): 2 mg via INTRAVENOUS
  Filled 2022-08-07 (×3): qty 1

## 2022-08-07 MED ORDER — SODIUM ZIRCONIUM CYCLOSILICATE 10 G PO PACK
10.0000 g | PACK | Freq: Once | ORAL | Status: AC
  Administered 2022-08-07: 10 g via ORAL
  Filled 2022-08-07: qty 1

## 2022-08-07 MED ORDER — SEVELAMER CARBONATE 800 MG PO TABS
800.0000 mg | ORAL_TABLET | Freq: Three times a day (TID) | ORAL | Status: DC
  Filled 2022-08-07: qty 1

## 2022-08-07 MED ORDER — SORBITOL 70 % SOLN
960.0000 mL | TOPICAL_OIL | Freq: Once | ORAL | Status: DC
  Filled 2022-08-07: qty 240

## 2022-08-07 NOTE — Progress Notes (Signed)
eLink Physician-Brief Progress Note Patient Name: Carl Bell DOB: [birth year] MRN: [id number]   Date of Service  08/07/2022  HPI/Events of Note  Patient is hypothermic.  eICU Interventions  [facility] ordered.        [name] [name] 08/07/2022, 5:07 AM

## 2022-08-07 NOTE — Progress Notes (Signed)
eLink Physician-Brief Progress Note Patient Name: Carl Bell DOB: 04-17-1951 MRN: [id number]   Date of Service  08/07/2022  HPI/Events of Note  Patient is going down for a CT abdomen / pelvis to r/o bowel obstruction.  eICU Interventions  Okay to receive gastrografin contrast for the study.        Frederik Pear 08/07/2022, 10:39 PM

## 2022-08-07 NOTE — Progress Notes (Addendum)
NAME:  Carl Bell, MRN:  [id number], DOB:  1951/03/22, LOS: 4 ADMISSION DATE:  08/03/2022, CONSULTATION DATE:  07/22/2022 REFERRING MD:  Isla Pence, MD, CHIEF COMPLAINT:  Septic shock   History of Present Illness:  72 year old male [facility] resident who presents for altered mental status. Blood glucose 46 however unable to take PO due to mental status. In the ED given D50. Found profoundly hypotensive and hypothermic with rectal temp 90. Given IVF,  Vanc/Cefepime/Flagyl. CXR right basilar infiltrate/atelectasis with small right pleural effusion. Started on levophed. Currently on 4 mcg/min. Pertinent labs include WBC 3.8 Hg 6.5. LA 2.3. PCCM consulted for admission.  Of note, he had recent hospitalization (06/22/22-07/18/22) for sigmoid volvulus s/p decompression colonoscopy with subsequent Hartman's procedure. Also had ascites requiring paracentesis that resolved after this. He was previously hospice before this however rescinded for the above interventions. He was discharged to [facility] and reportedly combative with dislodged foley. Unfortunately [facility] not accept patient back and he remained in the ED as a boarder from 1/5-1/17 until he was accepted at [facility]. During his boarded time he was treated for pseudomonas UTI with Cipro  In the ED patient not interactive but protecting airway. On levophed 15 and hypotensive  Pertinent  Medical History  COPD with emphysema, chronic hypoxemic respiratory failure on 2L at home, hx lung cancer, HTN, chronic vision loss  Significant Hospital Events: Including procedures, antibiotic start and stop dates in addition to other pertinent events   12/10-1/5 Hospital admission for sigmoid volvulus 1/5-1/17 ED boarder due to placement issues 1/21 Admit to PCCM after presenting with AMS, started on vasopressors 1/22 stress dose steroids started due to being on vaso and levophed 1/24 - GOC with plans to continue treatment   Interim History /  Subjective:  No events overnight. Morning labs pending.  Objective   Blood pressure (!) 123/90, pulse 84, temperature (!) 95.4 F (35.2 C), temperature source Bladder, resp. rate 18, weight 56.9 kg, SpO2 97 %.        Intake/Output Summary (Last 24 hours) at 08/07/2022 0824 Last data filed at 08/07/2022 2563 Gross per 24 hour  Intake 873.03 ml  Output 525 ml  Net 348.03 ml   Filed Weights   07/21/2022 2152 07/21/2022 0327  Weight: 53.3 kg 56.9 kg   Physical Examination: General: Chronically ill-appearing male in NAD. Cachetic  HEENT: [place]/AT, anicteric sclera, PERRL, moist mucous membranes. Neuro: Awake but not oriented.  Does not respond to verbal commands  CV: RRR, no m/g/r. PULM: Breathing even and shallow on 2L [place] . Lung fields Coarse. GI: Soft, nontender, nondistended. Normoactive bowel sounds. Extremities: Trace LE edema noted. Skin: Warm/dry, no rashes.  Resolved Hospital Problem list   N/A  Assessment & Plan:   Acute metabolic encephalopathy In setting of sepsis CT head negative Ammonia WNL TSH 8 Plan - Cont monitor neuro status  - Supportive Care    Septic shock, unknown etiology Adrenal Insufficiency ?Cardiogenic shock Plan - On NE and Vaso - Wean vasopressors for MAP >65 - Continue stress dose steroids - PRN fluid boluses - Cont Cefepime Stop 1/28, Azithromycin Stop 1/27 > can de-escalate when cx result - Blood Cx pending   ST Elevations on EKG - troponins 71, 68 - Not a candidate for coronary intervention given recent GI bleeding Plan  - Continue to monitor  - ECHO completed with no wall abnormalities    Acute blood loss anemia, no evidence of bleeding Transfused 1 unit 1/21 -  Trend H&H - Monitor for signs of active bleeding - Transfuse for Hgb < 7.0 or hemodynamically significant bleeding  Acute kidney failure Right Hydroureter Cr 6.93, on admission. Baseline 0.99 - 1.2  >> Discussed case with Urology. Recommended IR place perc nephrostomy  tubes but IR not able due as there is no dilation of the kidneys. Will hold off on having stents placed as unsure this is the source of infection Plan  - Trend BMP - Replete electrolytes as indicated - Monitor I&Os - Avoid nephrotoxic agents as able - Ensure adequate renal perfusion  COPD with emphysema Chronic hypoxemic respiratory failure on 2L at home Plan  - Wean supplemental O2 for goal >88%  Severe Protein Calorie Malnutrition Hypoalbuminemia - TF on hold, xray abdomen favoring obstruction   Hyperglycemia Plan - SSI  - CBG q4 - Goal CBG 140-180  Best Practice (right click and "Reselect all SmartList Selections" daily)   Diet/type: NPO DVT prophylaxis: other Hold for now GI prophylaxis: N/A Lines: Port Foley:  Foley Catheter  Code Status:  full code Last date of multidisciplinary goals of care discussion [1/21] Confirmed by ED - [place] 1/24 - continue care   - Pt has three children  Labs   CBC: Recent Labs  Lab 07/20/2022 1518 08/04/22 0148 08/04/22 1636 07/25/2022 0010 07/29/2022 1112  WBC 3.8* 6.0 6.5 6.1  --   NEUTROABS 3.2  --   --   --   --   HGB 6.5* 7.7* 7.0* 7.5* 7.8*  HCT 20.4* 22.8* 21.3* 23.3* 23.0*  MCV 75.6* 76.3* 77.2* 77.4*  --   PLT 170 170 127* 125*  --     Basic Metabolic Panel: Recent Labs  Lab 08/07/2022 1432 07/15/2022 2240 08/13/2022 0256 07/16/2022 1112 08/06/22 1739 08/07/22 0504  NA 145 142 141 144  --   --   K 4.9 4.6 5.0 5.1  --   --   CL 107 108 110  --   --   --   CO2 14* 18* 19*  --   --   --   GLUCOSE 154* 108* 87  --   --   --   BUN 63* 55* 56*  --   --   --   CREATININE 6.93* 5.88* 5.86*  --   --   --   CALCIUM 7.5* 7.4* 7.6*  --   --   --   MG  --   --   --   --  1.5* 1.5*  PHOS  --   --   --   --  8.5* 8.4*   GFR: Estimated Creatinine Clearance: 9 mL/min (A) (by C-G formula based on SCr of 5.86 mg/dL (H)). Recent Labs  Lab 08/02/2022 1432 08/04/2022 1518 07/17/2022 1823 08/06/2022 2240 08/04/22 0148 08/04/22 1636  07/24/2022 0010  WBC  --  3.8*  --   --  6.0 6.5 6.1  LATICACIDVEN 2.3*  --  1.5 1.2  --   --   --     Liver Function Tests: Recent Labs  Lab 08/10/2022 1432 07/28/2022 0256  AST 26 22  ALT 12 12  ALKPHOS 45 61  BILITOT 0.8 0.8  PROT 5.4* 5.4*  ALBUMIN 1.5* 2.4*   No results for input(s): "LIPASE", "AMYLASE" in the last 168 hours. Recent Labs  Lab 07/28/2022 1900  AMMONIA 19    ABG    Component Value Date/Time   PHART 7.240 (L) 07/23/2022 1112   PCO2ART 36.1 07/25/2022 1112  PO2ART 187 (H) 08/01/2022 1112   HCO3 15.5 (L) 07/23/2022 1112   TCO2 17 (L) 07/18/2022 1112   ACIDBASEDEF 11.0 (H) 07/25/2022 1112   O2SAT 99 08/02/2022 1112     Coagulation Profile: Recent Labs  Lab 07/26/2022 2240  INR 1.6*    Cardiac Enzymes: No results for input(s): "CKTOTAL", "CKMB", "CKMBINDEX", "TROPONINI" in the last 168 hours.  HbA1C: Hgb A1c MFr Bld  Date/Time Value Ref Range Status  08/03/2020 04:13 PM 5.3 4.8 - 5.6 % Final    Comment:    (NOTE) Pre diabetes:          5.7%-6.4%  Diabetes:              >6.4%  Glycemic control for   <7.0% adults with diabetes   12/24/2017 04:56 AM 5.5 4.8 - 5.6 % Final    Comment:    (NOTE) Pre diabetes:          5.7%-6.4% Diabetes:              >6.4% Glycemic control for   <7.0% adults with diabetes     CBG: Recent Labs  Lab 08/06/22 1523 08/06/22 1920 08/06/22 2322 08/07/22 0333 08/07/22 0807  GLUCAP 111* 131* 149* 147* 202*     Critical care time:     Erma Heritage, NP-S Cct deferred to Attending

## 2022-08-07 NOTE — Progress Notes (Addendum)
Daily Progress Note   Patient Name: Carl Bell       Date: 08/07/2022 DOB: Sep 28, 1950  Age: 72 y.o. MRN#: [id number] Attending Physician: Freddi Starr, MD Primary Care Physician: Kirk Ruths, MD Admit Date: 07/22/2022  Reason for Consultation/Follow-up: Establishing goals of care  Patient Profile/HPI:  72 y.o. male  with past medical history of cirrhosis of the liver, legally blind, COPD, chronic hypoxemic respiratory failure on 2L at home, history of lung cancer- s/p chemo and radiation therapy, last seen by Dr. Grayland Ormond 11/2021 at which time no further treatment and no further followup was recommended- Dr. Gary Fleet note also discussed he had declining performance status at that time and would not be candidate for any treatment if he had a recurrence, admitted 12/10-1/5 for volvulus and underwent sigmoid resection and placement of colostomy- of note he was receiving hospice services through [facility] prior to the 12/10 admission- but this was rescinded for admission and per note by Margaretmary Eddy on 12/10 patient was not aware he was receiving hospice care "he thought Medicare staff were visiting.". He was discharged on 1/5 to [facility], however, returned to ED on 1/5 due to being combative and dislodging his foley catheter. He was held in the ED from 1/5-1/17 and there is documentation of intermittent confusion, visual and auditory hallucinations and poor po intake during his time in the ED.  OT evaluated and determined to have significant impairments in short term memory, recall, orientation, and working memory on 1/17. He was discharged from ED to [facility] on 1/17. He is now readmitted on 08/04/2022 with altered mental status. He was hypoglycemic with CBG 46. Acute renal failure  with BUN/Cr 63/6.93; hypotensive and hypothermic. Started on sepsis protocol. Etiology of sepsis at this point is unknown. Albumin on admission was 1.5. AST/ALT WNL.Ammonia was not elevated. Blood cultures show no growth to date (x 2 days). Urine culture resulted with many species and recollection was recommended. He continues to be hypotensive and is currently on norepi 32mcg/hr and vasopressin at 0.03units/min. Palliative medicine consulted for goals of care.       Subjective: Chart reviewed including labs, progress notes, imaging from this and previous encounters. Noted abdominal xray concerning for obstruction.  Patient continues to respond minimally, no verbal responses.  I called and spoke with son,  [name]. Answered his questions. [name] shares his worries re: what seems like a very quick decline- prior to admission patient was ambulating intermittently, calling friends and family on the phone.  We discussed how an acute infection in the presence of chronic illness can quickly cause a patient to decompensate.    Review of Systems  Unable to perform ROS: Mental status change     Physical Exam Vitals and nursing note reviewed.  Constitutional:      Comments: Severe cachexia  Cardiovascular:     Rate and Rhythm: Normal rate.  Neurological:     Comments: nonverbal             Vital Signs: BP 102/73   Pulse (!) 118   Temp 99.1 F (37.3 C) (Other (Comment)) Comment (Src): rectal probe  Resp 20   Wt 56.9 kg   SpO2 96%   BMI 16.55 kg/m  SpO2: SpO2: 96 % O2 Device: O2 Device: Nasal Cannula O2 Flow Rate: O2 Flow Rate (L/min): 2 L/min  Intake/output summary:  Intake/Output Summary (Last 24 hours) at 08/07/2022 1400 Last data filed at 08/07/2022 7078 Gross per 24 hour  Intake 803.84 ml  Output 225 ml  Net 578.84 ml   LBM: Last BM Date : 08/06/22 Baseline Weight: Weight: 53.3 kg Most recent weight: Weight: 56.9 kg       PPS: 10%      Patient Active Problem List    Diagnosis Date Noted   Septic shock ([place]) 08/06/2022   Pressure injury of skin 67/54/4920   Acute metabolic encephalopathy 04/19/1218   Hypothermia 08/06/2022   Adrenal insufficiency ([place]) 08/06/2022   Abnormal ECG 08/06/2022   Symptomatic anemia 08/06/2022   Sepsis due to urinary tract infection ([place]) 08/06/2022   Chronic respiratory failure with hypoxia ([place]) 08/06/2022   Nystagmus 07/17/2022   Urinary retention due to benign prostatic hyperplasia 07/12/2022   Cirrhosis of liver with ascites ([place]) 07/12/2022   Anasarca associated with disorder of kidney 07/10/2022   Acute renal failure due to urinary obstruction ([place]) 07/10/2022   Sigmoid volvulus ([place]) 06/28/2022   Acute postoperative anemia due to expected blood loss 06/24/2022   Hyperkalemia 06/24/2022   Volvulus of intestine ([place]) 06/22/2022   Hypokalemia 06/22/2022   Aspiration pneumonia ([place]) 09/14/2021   COVID 08/04/2020   Demand ischemia 08/04/2020   SOB (shortness of breath) 08/04/2020   Primary open angle glaucoma (POAG) of both eyes, severe stage 05/13/2020   Legally blind 05/13/2020   COPD exacerbation ([place]) 11/08/2019   Acute exacerbation of chronic obstructive pulmonary disease (COPD) ([place]) 10/18/2019   Hypertensive urgency 10/17/2019   COPD with acute exacerbation ([place]) 09/12/2019   Acute on chronic respiratory failure with hypoxia and hypercapnia ([place]) 09/12/2019   Tobacco abuse 09/12/2019   PAD (peripheral artery disease) ([place]) 09/12/2019   HLD (hyperlipidemia) 09/12/2019   Atherosclerosis of native arteries of the extremities with ulceration ([place]) 06/28/2019   Sepsis ([place]) 12/24/2017   Protein-calorie malnutrition, severe 11/12/2017   Acute respiratory failure ([place]) 11/10/2017   Acute respiratory distress 12/22/2015   COPD (chronic obstructive pulmonary disease) ([place]) 12/22/2015   Acute bronchitis 12/22/2015   Hypertension 12/22/2015   Lung cancer, lingula ([place]) 09/23/2015   Chronic edema 09/15/2014     Palliative Care Assessment & Plan    Assessment/Recommendations/Plan  Continue current plan of care Discussed abdominal xray results with attending team   Code Status: DNR  Prognosis:  Unable to determine  Discharge Planning: To Be Determined  Care plan was  discussed with patient's family and care team.   Thank you for allowing the Palliative Medicine Team to assist in the care of this patient.  Greater than 50%  of this time was spent counseling and coordinating care related to the above assessment and plan.  Mariana Kaufman, AGNP-C Palliative Medicine   Please contact Palliative Medicine Team phone at [phone] for questions and concerns.

## 2022-08-08 DIAGNOSIS — R6520 Severe sepsis without septic shock: Secondary | ICD-10-CM

## 2022-08-08 DIAGNOSIS — E43 Unspecified severe protein-calorie malnutrition: Secondary | ICD-10-CM

## 2022-08-08 DIAGNOSIS — A419 Sepsis, unspecified organism: Secondary | ICD-10-CM | POA: Diagnosis not present

## 2022-08-08 DIAGNOSIS — N179 Acute kidney failure, unspecified: Secondary | ICD-10-CM | POA: Diagnosis not present

## 2022-08-08 DIAGNOSIS — G9341 Metabolic encephalopathy: Secondary | ICD-10-CM | POA: Diagnosis not present

## 2022-08-08 LAB — MAGNESIUM: Magnesium: 1.6 mg/dL — ABNORMAL LOW (ref 1.7–2.4)

## 2022-08-08 LAB — CULTURE, BLOOD (ROUTINE X 2)
Culture: NO GROWTH
Culture: NO GROWTH
Special Requests: ADEQUATE

## 2022-08-08 LAB — GLUCOSE, CAPILLARY
Glucose-Capillary: 124 mg/dL — ABNORMAL HIGH (ref 70–99)
Glucose-Capillary: 106 mg/dL — ABNORMAL HIGH (ref 70–99)

## 2022-08-08 LAB — PHOSPHORUS: Phosphorus: 8.8 mg/dL — ABNORMAL HIGH (ref 2.5–4.6)

## 2022-08-08 MED ORDER — GLYCOPYRROLATE 1 MG PO TABS: 1.0000 mg | ORAL_TABLET | ORAL | Status: DC | PRN

## 2022-08-08 MED ORDER — POLYVINYL ALCOHOL 1.4 % OP SOLN: 1.0000 [drp] | Freq: Four times a day (QID) | OPHTHALMIC | Status: DC | PRN

## 2022-08-08 MED ORDER — ACETAMINOPHEN 325 MG PO TABS: 650.0000 mg | ORAL_TABLET | Freq: Four times a day (QID) | ORAL | Status: DC | PRN

## 2022-08-08 MED ORDER — MORPHINE BOLUS VIA INFUSION
5.0000 mg | INTRAVENOUS | Status: DC | PRN
  Administered 2022-08-08: 5 mg via INTRAVENOUS

## 2022-08-08 MED ORDER — ACETAMINOPHEN 650 MG RE SUPP: 650.0000 mg | Freq: Four times a day (QID) | RECTAL | Status: DC | PRN

## 2022-08-08 MED ORDER — MORPHINE 100MG IN NS 100ML (1MG/ML) PREMIX INFUSION
0.0000 mg/h | INTRAVENOUS | Status: DC
  Administered 2022-08-08: 5 mg/h via INTRAVENOUS
  Filled 2022-08-08: qty 100

## 2022-08-08 MED ORDER — GLYCOPYRROLATE 0.2 MG/ML IJ SOLN: 0.2000 mg | INTRAMUSCULAR | Status: DC | PRN

## 2022-08-08 MED ORDER — SODIUM CHLORIDE 0.9 % IV SOLN: INTRAVENOUS | Status: DC

## 2022-08-14 NOTE — Progress Notes (Signed)
NAME:  Carl Bell, MRN:  [id number], DOB:  1950/10/23, LOS: 5 ADMISSION DATE:  07/21/2022, CONSULTATION DATE:  07/23/2022 REFERRING MD:  Carl Pence, MD, CHIEF COMPLAINT:  Septic shock   History of Present Illness:  72 year old male [facility] resident who presents for altered mental status. Blood glucose 46 however unable to take PO due to mental status. In the ED given D50. Found profoundly hypotensive and hypothermic with rectal temp 90. Given IVF,  Vanc/Cefepime/Flagyl. CXR right basilar infiltrate/atelectasis with small right pleural effusion. Started on levophed. Currently on 4 mcg/min. Pertinent labs include WBC 3.8 Hg 6.5. LA 2.3. PCCM consulted for admission.  Of note, he had recent hospitalization (06/22/22-07/18/22) for sigmoid volvulus s/p decompression colonoscopy with subsequent Hartman's procedure. Also had ascites requiring paracentesis that resolved after this. He was previously hospice before this however rescinded for the above interventions. He was discharged to [facility] and reportedly combative with dislodged foley. Unfortunately [facility] did not accept patient back and he remained in the ED as a boarder from 1/5-1/17 until he was accepted at [facility]. During his boarded time he was treated for pseudomonas UTI with Cipro  In the ED patient not interactive but protecting airway. On levophed 15 and hypotensive  Pertinent  Medical History  COPD with emphysema, chronic hypoxemic respiratory failure on 2L at home, hx lung cancer, HTN, chronic vision loss  Significant Hospital Events: Including procedures, antibiotic start and stop dates in addition to other pertinent events   12/10-1/5 Hospital admission for sigmoid volvulus 1/5-1/17 ED boarder due to placement issues 1/21 Admit to PCCM after presenting with AMS, started on vasopressors 1/22 stress dose steroids started due to being on vaso and levophed 1/24 - GOC with plans to continue treatment 1/25 - Xray concerning for  obstruction, NGT placed, CT A/P ordered, overnight worsening resp status, requiring NRB  Interim History / Subjective:  Overnight worsening hypoxia requiring NRB, patient this morning is having increased labored breathing. Family called to come to bedside. Spoke with Carl Bell and Carl Bell's Wife. Attempted to call Carl Bell, no answer.   Objective   Blood pressure 94/70, pulse 92, temperature 97.7 F (36.5 C), temperature source Other (Comment), resp. rate (!) 23, weight 56.9 kg, SpO2 (!) 87 %.        Intake/Output Summary (Last 24 hours) at 03-Sep-2022 0918 Last data filed at 03-Sep-2022 0542 Gross per 24 hour  Intake 957.21 ml  Output 160 ml  Net 797.21 ml   Filed Weights   07/28/2022 2152 08/06/2022 0327  Weight: 53.3 kg 56.9 kg   Physical Examination: General: Chronically ill-appearing male in NAD. HEENT: [place]/AT, anicteric sclera, PERRL, moist mucous membranes. Neuro: Lethargic. Responds to tactile stimuli. Not following commands.  CV: RRR, no m/g/r. PULM: Breathing even and labored on . Lung fields diminished. GI: Soft, nontender, nondistended. Normoactive bowel sounds. Ostomy Extremities: trace LE edema noted. Skin: Warm/dry, no rashes.  Resolved Hospital Problem list   N/A  Assessment & Plan:   Acute metabolic encephalopathy In setting of sepsis CT head negative Ammonia WNL TSH 8 Plan - Cont neuro monitoring - Supportive Care   Septic shock >UC positive for pseudomonas aeruginosa   Adrenal Insufficiency Plan - On NE and Vaso - Wean vasopressors for MAP >65 - Continue stress dose steroids - Cont Cefepime Stop 1/28, Azithromycin Stop 1/27 > can de-escalate when cx result - Blood Cx pending   ST Elevations on EKG - troponins 71, 68 - Not a candidate for coronary intervention given  recent GI bleeding Plan  - Continue to monitor  - ECHO completed with no wall abnormalities   - Supportive Care   Acute blood loss anemia, no evidence of bleeding Transfused 1 unit  1/21 - Trend H&H - Monitor for signs of active bleeding - Transfuse for Hgb < 7.0 or hemodynamically significant bleeding  Acute kidney failure Right Hydroureter Cr 6.93, on admission. Baseline 0.99 - 1.2  >> Discussed case with Urology. Recommended IR place perc nephrostomy tubes but IR not able due as there is no dilation of the kidneys. Will hold off on having stents placed as unsure this is the source of infection Plan  - Trend BMP - Replete electrolytes as indicated - Monitor I&Os - F/u urine studies - Avoid nephrotoxic agents as able - Ensure adequate renal perfusion  COPD with emphysema Chronic hypoxemic respiratory failure on 2L at home Plan  - Wean o2 to Spo2 of 88&  Severe Protein Calorie Malnutrition Hypoalbuminemia - TF on hold, xray abdomen favoring obstruction - CT A/P ordered however, will hold for now given acute change  Hyperglycemia Plan - SSI  - CBG q4 - Goal CBG 140-180  Overnight patient became hypoxic requiring NRB to maintain adequate oxygen saturation. I spoke with the family today Carl Bell and Carl Bell's Wife) and explained to them that I feel that [name] is taking a turn for the worse. I explained to them that at this time I do not feel comfortable for [name] to be transferred out of the ICU for a CT A/P trip, given that he is less stable. I also explained to them that if the results of the CT scan shows a need for surgical intervention, that his body would not be able to withstand another surgery at this time. With that being said, I do believe that [name] is approaching death. I shared this with the family and encouraged to be present at bedside for further conversations about shifting care to comfort care.    Best Practice (right click and "Reselect all SmartList Selections" daily)   Diet/type: NPO DVT prophylaxis: other Hold for now GI prophylaxis: N/A Lines: Port Foley:  Foley Catheter  Code Status:  full code Last date of multidisciplinary  goals of care discussion [1/21] Confirmed by ED - [facility] 1/24 - continue care   - Pt has three children  Labs   CBC: Recent Labs  Lab 07/26/2022 1518 08/04/22 0148 08/04/22 1636 07/26/2022 0010 08/02/2022 1112 08/07/22 1028  WBC 3.8* 6.0 6.5 6.1  --  10.7*  NEUTROABS 3.2  --   --   --   --   --   HGB 6.5* 7.7* 7.0* 7.5* 7.8* 7.2*  HCT 20.4* 22.8* 21.3* 23.3* 23.0* 21.3*  MCV 75.6* 76.3* 77.2* 77.4*  --  74.2*  PLT 170 170 127* 125*  --  76*    Basic Metabolic Panel: Recent Labs  Lab 08/07/2022 1432 07/29/2022 2240 07/30/2022 0256 08/09/2022 1112 08/06/22 1739 08/07/22 0504 08/07/22 1028 08/07/22 2219 2022/09/05 0614  NA 145 142 141 144  --   --  143  --   --   K 4.9 4.6 5.0 5.1  --   --  5.4*  --   --   CL 107 108 110  --   --   --  108  --   --   CO2 14* 18* 19*  --   --   --  20*  --   --   GLUCOSE 154* 108* 87  --   --   --  233*  --   --   BUN 63* 55* 56*  --   --   --  75*  --   --   CREATININE 6.93* 5.88* 5.86*  --   --   --  5.60*  --   --   CALCIUM 7.5* 7.4* 7.6*  --   --   --  7.7*  --   --   MG  --   --   --   --  1.5* 1.5*  --  1.7 1.6*  PHOS  --   --   --   --  8.5* 8.4*  --  8.6* 8.8*   GFR: Estimated Creatinine Clearance: 9.5 mL/min (A) (by C-G formula based on SCr of 5.6 mg/dL (H)). Recent Labs  Lab 08/11/2022 1432 07/16/2022 1518 07/18/2022 1823 08/11/2022 2240 08/04/22 0148 08/04/22 1636 08/02/2022 0010 08/07/22 1028  WBC  --    < >  --   --  6.0 6.5 6.1 10.7*  LATICACIDVEN 2.3*  --  1.5 1.2  --   --   --   --    < > = values in this interval not displayed.    Liver Function Tests: Recent Labs  Lab 07/14/2022 1432 08/04/2022 0256  AST 26 22  ALT 12 12  ALKPHOS 45 61  BILITOT 0.8 0.8  PROT 5.4* 5.4*  ALBUMIN 1.5* 2.4*   No results for input(s): "LIPASE", "AMYLASE" in the last 168 hours. Recent Labs  Lab 08/11/2022 1900  AMMONIA 19    ABG    Component Value Date/Time   PHART 7.240 (L) 07/28/2022 1112   PCO2ART 36.1 08/10/2022 1112   PO2ART 187 (H)  07/16/2022 1112   HCO3 15.5 (L) 07/23/2022 1112   TCO2 17 (L) 07/18/2022 1112   ACIDBASEDEF 11.0 (H) 07/16/2022 1112   O2SAT 99 08/13/2022 1112     Coagulation Profile: Recent Labs  Lab 07/28/2022 2240  INR 1.6*    Cardiac Enzymes: No results for input(s): "CKTOTAL", "CKMB", "CKMBINDEX", "TROPONINI" in the last 168 hours.  HbA1C: Hgb A1c MFr Bld  Date/Time Value Ref Range Status  08/03/2020 04:13 PM 5.3 4.8 - 5.6 % Final    Comment:    (NOTE) Pre diabetes:          5.7%-6.4%  Diabetes:              >6.4%  Glycemic control for   <7.0% adults with diabetes   12/24/2017 04:56 AM 5.5 4.8 - 5.6 % Final    Comment:    (NOTE) Pre diabetes:          5.7%-6.4% Diabetes:              >6.4% Glycemic control for   <7.0% adults with diabetes     CBG: Recent Labs  Lab 08/07/22 1523 08/07/22 1931 08/07/22 2327 09/06/2022 0315 09/06/2022 0808  GLUCAP 154* 129* 134* 124* 106*    Critical care time:     Erma Heritage, NP-S Cct deferred to Attending

## 2022-08-14 NOTE — Progress Notes (Signed)
   09/03/2022 1100  Spiritual Encounters  Type of Visit Initial  Care provided to: Pt and family  Conversation partners present during encounter Nurse  Referral source Family  Reason for visit End-of-life  OnCall Visit No  Spiritual Framework  Values/beliefs family  Community/Connection Family  Family Stress Factors Loss  Interventions  Spiritual Care Interventions Made Compassionate presence;Reflective listening;Normalization of emotions;Prayer;Encouragement;Supported grief process  Intervention Outcomes  Outcomes Reduced fear  Spiritual Care Plan  Spiritual Care Issues Still Outstanding Referring to oncoming chaplain for further support   CH responded for request for prayer. Pt's family was at bedside. Family is hoping that pt will recover. Family is in shock and overwhelm with the current situation. The family is waiting for other members of the family to arrive. [place] asks attending nurse to let us know when they arrive. Family ask for prayer. [place] prayed for patient and family. Family offered thanks and gratitude.

## 2022-08-14 NOTE — Progress Notes (Signed)
Pt's brothers are at bedside. They are the last of the family to come visit the pt. Morphine drip started and pressors titrated down and turned off per order. Pt appears comfortable. Family asked to talk to NP and the chaplain. Both were called to come by and see the family.

## 2022-08-14 NOTE — Death Summary Note (Signed)
DEATH SUMMARY   Patient Details  Name: Carl Bell MRN: [id number] DOB: Oct 21, 1950  Admission/Discharge Information   Admit Date:  08-14-22  Date of Death: Date of Death: 08/19/2022  Time of Death: Time of Death: 10-01-2045  Length of Stay: 5  Referring Physician: Kirk Ruths, MD   Reason(s) for Hospitalization  Acute anterior ST elevation MI Septic shock due to urinary tract infection Acute septic encephalopathy Acute blood loss anemia AKI due to obstructive uropathy Right hydroureter Hyperkalemia/hyperphosphatemia/hypomagnesemia Acute on chronic hypoxic/hypercapnic respiratory failure Severe protein calorie malnutrition Anemia of chronic disease Thrombocytopenia of critical illness  Diagnoses  Preliminary cause of death: Withdrawal of care in the setting of multisystem organ failure Secondary Diagnoses (including complications and co-morbidities):  Principal Problem:   Septic shock ([place]) Active Problems:   COPD (chronic obstructive pulmonary disease) (HCC)   Protein-calorie malnutrition, severe   PAD (peripheral artery disease) (HCC)   Acute renal failure due to urinary obstruction (HCC)   Cirrhosis of liver with ascites (HCC)   Legally blind   Pressure injury of skin   Acute metabolic encephalopathy   Hypothermia   Adrenal insufficiency (HCC)   Abnormal ECG   Symptomatic anemia   Sepsis due to urinary tract infection ([place])   Chronic respiratory failure with hypoxia [facility])   Brief Hospital Course (including significant findings, care, treatment, and services provided and events leading to death)  Carl Bell is a 72 y.o. year old male [facility] resident who presents for altered mental status. Blood glucose 46 however unable to take PO due to mental status. In the ED given D50. Found profoundly hypotensive and hypothermic with rectal temp 90. Given IVF,  Vanc/Cefepime/Flagyl. CXR right basilar infiltrate/atelectasis with small right pleural effusion. Started on  levophed. Currently on 4 mcg/min. Pertinent labs include WBC 3.8 Hg 6.5. LA 2.3. PCCM consulted for admission.   Of note, he had recent hospitalization (06/22/22-07/18/22) for sigmoid volvulus s/p decompression colonoscopy with subsequent Hartman's procedure. Also had ascites requiring paracentesis that resolved after this. He was previously hospice before this however rescinded for the above interventions. He was discharged to [facility] and reportedly combative with dislodged foley. Unfortunately [facility] did not accept patient back and he remained in the ED as a boarder from 1/5-1/17 until he was accepted at [facility]. During his boarded time he was treated for pseudomonas UTI with Cipro Patient was admitted to ICU, with working diagnosis of severe sepsis with septic shock and acute inferior ST elevation MI, he was started on IV heparin infusion and continued medical treatment, cardiology saw the patient, recommend continuing medical management, not a candidate for invasive procedure considering patient was in severe septic shock.  He was continued on antibiotic therapy, patient mental status remain poor despite treatment with antibiotic therapy, he continued to have rising serum creatinine with altered electrolyte.  He required nonrebreather facemask and high flow nasal cannula oxygen, as patient continued to decline despite full scope of medical care, goals of care discussions were carried with family, they decided to keep him DNR and proceed with comfort care.  Comfort care orders were written, patient passed on 08/19/22 at 8:47 PM.   Pertinent Labs and Studies  Significant Diagnostic Studies DG Abd 1 View  Result Date: 08/07/2022 CLINICAL DATA:  NG tube placement EXAM: ABDOMEN - 1 VIEW portable limited for tube placement COMPARISON:  X-ray 08/06/2022 FINDINGS: Limited x-ray of the lower chest and upper abdomen for tube placement demonstrates enteric tube with tip overlying the upper  stomach but  side hole at the GE junction. This could be advanced further into the stomach. There appears to be some contrast overlying the upper stomach as well. Please correlate with history. Several dilated loops of small bowel seen in the epigastric region of the abdomen. Bilateral pleural effusions are seen, right-greater-than-left. Separate central venous catheter in place at the edge of the imaging field. Overlapping cardiac leads. The patient's right hand overlies the right midabdomen. IMPRESSION: Changed from Dobbhoff tube to an NG tube with side hole above the GE junction. This could be advanced further into the stomach. Limited x-ray for tube placement Electronically Signed   By: Jill Side M.D.   On: 08/07/2022 19:54   DG Abd Portable 1V  Result Date: 08/06/2022 CLINICAL DATA:  Feeding tube EXAM: PORTABLE ABDOMEN - 1 VIEW COMPARISON:  06/22/2022 FINDINGS: Bilateral pleural effusions and airspace disease at the right base. Esophageal tube tip overlies the proximal stomach. Dilated loops of small bowel in the central abdomen suggesting an obstruction. IMPRESSION: Esophageal tube tip overlies the proximal stomach. Dilated loops of small bowel in the central abdomen suggesting an obstruction. Pleural effusions and airspace disease at the right base. Electronically Signed   By: Donavan Foil M.D.   On: 08/06/2022 15:53   ECHOCARDIOGRAM LIMITED  Result Date: 08/06/2022    ECHOCARDIOGRAM LIMITED REPORT   Patient Name:   Carl Bell Date of Exam: 08/06/2022 Medical Rec #:  [id number]      Height:       73.0 in Accession #:    [id number]     Weight:       125.4 lb Date of Birth:  [birth year]       BSA:          1.765 m Patient Age:    72 years       BP:           114/59 mmHg Patient Gender: M              HR:           91 bpm. Exam Location:  Inpatient Procedure: Limited Echo, Cardiac Doppler and Color Doppler Indications:    I24.9 Ischemic heart disease.  History:        Patient has prior history of Echocardiogram  examinations, most                 recent 08/06/2020. Abnormal ECG, COPD; Risk Factors:Hypertension                 and Former Smoker. Cancer.  Sonographer:    Roseanna Rainbow RDCS Referring Phys: Freddi Starr  Sonographer Comments: Technically difficult study due to poor echo windows. Patient only has low subcostal window and medial apical window. Patient in mitts and moving during exam. IMPRESSIONS  1. Abnormal septal motion . Left ventricular ejection fraction, by estimation, is 50 to 55%. The left ventricle has low normal function. The left ventricle has no regional wall motion abnormalities.  2. Right ventricular systolic function is moderately reduced. The right ventricular size is moderately enlarged. There is normal pulmonary artery systolic pressure.  3. Right atrial size was moderately dilated.  4. The mitral valve is abnormal. Mild mitral valve regurgitation. No evidence of mitral stenosis.  5. The aortic valve is tricuspid. There is mild calcification of the aortic valve. There is mild thickening of the aortic valve. Aortic valve regurgitation is mild. Aortic valve sclerosis is present, with no evidence of aortic  valve stenosis.  6. The inferior vena cava is dilated in size with >50% respiratory variability, suggesting right atrial pressure of 8 mmHg. FINDINGS  Left Ventricle: Abnormal septal motion. Left ventricular ejection fraction, by estimation, is 50 to 55%. The left ventricle has low normal function. The left ventricle has no regional wall motion abnormalities. The left ventricular internal cavity size was normal in size. There is no left ventricular hypertrophy. Right Ventricle: The right ventricular size is moderately enlarged. No increase in right ventricular wall thickness. Right ventricular systolic function is moderately reduced. There is normal pulmonary artery systolic pressure. The tricuspid regurgitant velocity is 2.34 m/s, and with an assumed right atrial pressure of 3 mmHg, the estimated  right ventricular systolic pressure is 62.7 mmHg. Left Atrium: Left atrial size was normal in size. Right Atrium: Right atrial size was moderately dilated. Pericardium: Trivial pericardial effusion is present. The pericardial effusion is anterior to the right ventricle. Mitral Valve: The mitral valve is abnormal. There is mild thickening of the mitral valve leaflet(s). Mild mitral valve regurgitation. No evidence of mitral valve stenosis. Tricuspid Valve: The tricuspid valve is normal in structure. Tricuspid valve regurgitation is mild . No evidence of tricuspid stenosis. Aortic Valve: The aortic valve is tricuspid. There is mild calcification of the aortic valve. There is mild thickening of the aortic valve. Aortic valve regurgitation is mild. Aortic valve sclerosis is present, with no evidence of aortic valve stenosis. Pulmonic Valve: The pulmonic valve was normal in structure. Pulmonic valve regurgitation is not visualized. No evidence of pulmonic stenosis. Aorta: The aortic root is normal in size and structure. Venous: The inferior vena cava is dilated in size with greater than 50% respiratory variability, suggesting right atrial pressure of 8 mmHg. IAS/Shunts: No atrial level shunt detected by color flow Doppler. Additional Comments: Spectral Doppler performed. Color Doppler performed.  LEFT VENTRICLE PLAX 2D LVIDd:         4.17 cm LVIDs:         3.10 cm LV PW:         1.07 cm LV IVS:        0.83 cm  LV Volumes (MOD) LV vol d, MOD A4C: 74.7 ml LV vol s, MOD A4C: 43.5 ml LV SV MOD A4C:     74.7 ml IVC IVC diam: 2.00 cm LEFT ATRIUM         Index LA diam:    3.20 cm 1.81 cm/m   AORTA Ao Root diam: 3.37 cm Ao Asc diam:  3.20 cm MITRAL VALVE               TRICUSPID VALVE MV Area (PHT): 4.39 cm    TR Peak grad:   21.9 mmHg MV Decel Time: 173 msec    TR Vmax:        234.00 cm/s MV E velocity: 0.56 cm/s MV A velocity: 50.00 cm/s MV E/A ratio:  0.01 Jenkins Rouge MD Electronically signed by Jenkins Rouge MD Signature  Date/Time: 08/06/2022/11:54:32 AM    Final    US RENAL  Result Date: 08/04/2022 CLINICAL DATA:  Acute kidney injury EXAM: RENAL / URINARY TRACT ULTRASOUND COMPLETE COMPARISON:  CT 07/10/2022 FINDINGS: Right Kidney: Renal measurements: 10.8 x 4.0 x 5.8 cm = volume: 132.0 mL. Mild pelvic fullness with hydroureter noted. Increased cortical echogenicity. Simple cysts measuring up to 1.41.2 cm. Left Kidney: Renal measurements: 10.8 x 5.1 x 4.6 cm = volume: 33.4 mL. No hydronephrosis. Increased cortical echogenicity. Bladder: Mildly distended.  Bilateral  ureteral jets are seen. Other: Bilateral pleural effusions.  Abdominopelvic ascites. IMPRESSION: Mild right-sided pelvic fullness and proximal hydroureter noted. Increased renal cortical echogenicity bilaterally, as can be seen in medical renal disease. Bilateral pleural effusions.  Abdominopelvic ascites. Electronically Signed   By: Maurine Simmering M.D.   On: 08/04/2022 19:30   CT Head Wo Contrast  Result Date: 08/02/2022 CLINICAL DATA:  Altered mental status EXAM: CT HEAD WITHOUT CONTRAST TECHNIQUE: Contiguous axial images were obtained from the base of the skull through the vertex without intravenous contrast. RADIATION DOSE REDUCTION: This exam was performed according to the departmental dose-optimization program which includes automated exposure control, adjustment of the mA and/or kV according to patient size and/or use of iterative reconstruction technique. COMPARISON:  07/17/2022 FINDINGS: Brain: There is no mass, hemorrhage or extra-axial collection. The size and configuration of the ventricles and extra-axial CSF spaces are normal. The brain parenchyma is normal, without acute or chronic infarction. Vascular: No abnormal hyperdensity of the major intracranial arteries or dural venous sinuses. No intracranial atherosclerosis. Skull: The visualized skull base, calvarium and extracranial soft tissues are normal. Sinuses/Orbits: No fluid levels or advanced  mucosal thickening of the visualized paranasal sinuses. No mastoid or middle ear effusion. The orbits are normal. IMPRESSION: Normal aging brain. Electronically Signed   By: Ulyses Jarred M.D.   On: 08/10/2022 19:05   DG Chest Port 1 View  Result Date: 07/29/2022 CLINICAL DATA:  Altered mental status. EXAM: PORTABLE CHEST 1 VIEW COMPARISON:  04/13/2022 FINDINGS: 1438 hours. Lungs are hyperexpanded. Hazy opacity at the right base is new in the interval and may be atelectasis with small layering right pleural effusion. The cardiopericardial silhouette is within normal limits for size. Right Port-A-Cath again noted. Telemetry leads overlie the chest. IMPRESSION: New hazy opacity at the right base may be atelectasis. There is a small layering right pleural effusion. Electronically Signed   By: Misty [place] M.D.   On: 08/01/2022 14:58   CT HEAD WO CONTRAST (5MM)  Result Date: 07/17/2022 CLINICAL DATA:  Mental status change of unknown etiology. New onset nystagmus EXAM: CT HEAD WITHOUT CONTRAST TECHNIQUE: Contiguous axial images were obtained from the base of the skull through the vertex without intravenous contrast. RADIATION DOSE REDUCTION: This exam was performed according to the departmental dose-optimization program which includes automated exposure control, adjustment of the mA and/or kV according to patient size and/or use of iterative reconstruction technique. COMPARISON:  MRI 10/04/2015 FINDINGS: Brain: No evidence of acute infarction, hemorrhage, hydrocephalus, extra-axial collection or mass lesion/mass effect. There is mild diffuse low-attenuation within the subcortical and periventricular white matter compatible with chronic microvascular disease. Vascular: No hyperdense vessel or unexpected calcification. Skull: Normal. Negative for fracture or focal lesion. Sinuses/Orbits: Paranasal sinuses and mastoid air cells are clear. Other: None. IMPRESSION: 1. No acute intracranial abnormalities. 2. Mild  chronic microvascular disease. Electronically Signed   By: Kerby Moors M.D.   On: 07/17/2022 15:27    Microbiology Recent Results (from the past 240 hour(s))  Culture, blood (routine x 2)     Status: None   Collection Time: 08/07/2022  3:20 PM   Specimen: BLOOD  Result Value Ref Range Status   Specimen Description BLOOD SITE NOT SPECIFIED  Final   Special Requests   Final    BOTTLES DRAWN AEROBIC AND ANAEROBIC Blood Culture adequate volume   Culture   Final    NO GROWTH 5 DAYS Performed at [facility], [address], [place], [place] [address]  Report Status 08-13-2022 FINAL  Final  Culture, blood (routine x 2)     Status: None   Collection Time: 08/01/2022  3:37 PM   Specimen: BLOOD  Result Value Ref Range Status   Specimen Description BLOOD SITE NOT SPECIFIED  Final   Special Requests   Final    BOTTLES DRAWN AEROBIC AND ANAEROBIC Blood Culture results may not be optimal due to an inadequate volume of blood received in culture bottles   Culture   Final    NO GROWTH 5 DAYS Performed at [facility], [place] [address] [place], [place] [address]    Report Status 08-13-2022 FINAL  Final  Urine Culture     Status: Abnormal   Collection Time: 07/30/2022  4:25 PM   Specimen: Urine, Catheterized  Result Value Ref Range Status   Specimen Description URINE, CATHETERIZED  Final   Special Requests   Final    NONE Performed at [facility], [place] [address], [place], [place] [address]    Culture MULTIPLE SPECIES PRESENT, SUGGEST RECOLLECTION (A)  Final   Report Status 07/19/2022 FINAL  Final  MRSA Next Gen by PCR, Nasal     Status: None   Collection Time: 08/04/22  3:16 AM   Specimen: Nasal Mucosa; Nasal Swab  Result Value Ref Range Status   MRSA by PCR Next Gen NOT DETECTED NOT DETECTED Final    Comment: (NOTE) The GeneXpert MRSA Assay (FDA approved for NASAL specimens only), is one component of a comprehensive MRSA colonization surveillance program. It is not  intended to diagnose MRSA infection nor to guide or monitor treatment for MRSA infections. Test performance is not FDA approved in patients less than 66 years old. Performed at [facility], [place] [address], [place], [place] [address]     Lab Basic Metabolic Panel: Recent Labs  Lab 07/27/2022 1112 08/06/22 1739 08/07/22 0504 08/07/22 1028 08/07/22 2219 13-Aug-2022 0614  NA 144  --   --  143  --   --   K 5.1  --   --  5.4*  --   --   CL  --   --   --  108  --   --   CO2  --   --   --  20*  --   --   GLUCOSE  --   --   --  233*  --   --   BUN  --   --   --  75*  --   --   CREATININE  --   --   --  5.60*  --   --   CALCIUM  --   --   --  7.7*  --   --   MG  --  1.5* 1.5*  --  1.7 1.6*  PHOS  --  8.5* 8.4*  --  8.6* 8.8*   Liver Function Tests: No results for input(s): "AST", "ALT", "ALKPHOS", "BILITOT", "PROT", "ALBUMIN" in the last 168 hours. No results for input(s): "LIPASE", "AMYLASE" in the last 168 hours. No results for input(s): "AMMONIA" in the last 168 hours. CBC: Recent Labs  Lab 07/22/2022 1112 08/07/22 1028  WBC  --  10.7*  HGB 7.8* 7.2*  HCT 23.0* 21.3*  MCV  --  74.2*  PLT  --  76*   Cardiac Enzymes: No results for input(s): "CKTOTAL", "CKMB", "CKMBINDEX", "TROPONINI" in the last 168 hours. Sepsis Labs: Recent Labs  Lab 08/07/22 1028  WBC  10.7*    Procedures/Operations     [employer] 08/12/2022, 9:47 AM

## 2022-08-14 NOTE — Progress Notes (Signed)
Assessed patient upon arrival to shift. He seems to be comfortable on morphine drip, aside from work of breathing which is labored. Pupils are non reactive.

## 2022-08-14 NOTE — Progress Notes (Signed)
Responded to page to support pt. And family at bedside.  Family requested prayer.  Pt.Is actively passing.  Chaplain provided emotional and spiritual support , prayer, listening and presence.   Chaplain available as needed.  Jaclynn Major, [place], [facility], [place] [phone]

## 2022-08-14 NOTE — Progress Notes (Signed)
Nutrition Brief Note  Chart reviewed. Pt now transitioning to comfort care.  No further nutrition interventions planned at this time.  Please re-consult as needed.   Kerman Passey MS, RDN, LDN, CNSC Registered Dietitian 3 Clinical Nutrition RD Pager and On-Call Pager Number Located in [place]

## 2022-08-14 NOTE — IPAL (Signed)
  Interdisciplinary Goals of Care Family Meeting   Date carried out: 08-11-2022  Location of the meeting: Bedside  Member's involved: Physician, Bedside Registered Nurse, and Family Member or next of kin, Nurse Practitioner     Durable Power of Attorney or acting medical decision maker: Children at bedside, Juanetta Gosling and Christia Reading     Discussion: We discussed goals of care for [place] called to bedside the morning d/t acute decline in patients status. Overnight patient with into respiratory distress requiring increased oxygen demands. Pt also is requiring increased vasopressor support to maintain adequate blood pressure for organ perfusion. On exam, his work of breathing is labored with a RR of 6-8 breaths per minute. I explained to the family that I believe Jashad 's body is beginning to shut down and that he is showing signs that death is in the near future.. Family is agreeable that they do not want any pain or suffering. I introduced the concept of a comfort path to the family and encouraged them to think about at what point patient would want to stop aggressive medical interventions and focus on comfort and dignity rather than cure/prolonging life. [name] is very hopefull that his dad will make a recovery but is understanding that if God is calling him home that this is his time. At this time the family has decided to continue current course of treatment without further excalation of care (vasopressors) until the rest of family arrives. Once all family is present, family encouraged to let bedside RN aware and will initiate the remaining transition of comfort care. Chaplain paged to bedside per family's request.  Code status:   Code Status: DNR   Disposition: In-patient comfort care  Time spent for the meeting: 35 min    Dorthia Tout, NP-S   08-11-2022, 11:02 AM

## 2022-08-14 DEATH — deceased

## 2022-09-30 DIAGNOSIS — Z7189 Other specified counseling: Secondary | ICD-10-CM
